# Patient Record
Sex: Female | Born: 1950 | Race: Black or African American | Hispanic: No | Smoking: Current every day smoker
Health system: Southern US, Community
[De-identification: ages and names within clinical notes are randomized; demographics above are authoritative.]

## PROBLEM LIST (undated history)

## (undated) DIAGNOSIS — I10 Essential (primary) hypertension: Secondary | ICD-10-CM

## (undated) DIAGNOSIS — Z9889 Other specified postprocedural states: Secondary | ICD-10-CM

## (undated) DIAGNOSIS — R112 Nausea with vomiting, unspecified: Secondary | ICD-10-CM

## (undated) DIAGNOSIS — I739 Peripheral vascular disease, unspecified: Secondary | ICD-10-CM

## (undated) DIAGNOSIS — H409 Unspecified glaucoma: Secondary | ICD-10-CM

## (undated) DIAGNOSIS — IMO0001 Reserved for inherently not codable concepts without codable children: Secondary | ICD-10-CM

## (undated) DIAGNOSIS — E785 Hyperlipidemia, unspecified: Secondary | ICD-10-CM

## (undated) DIAGNOSIS — D509 Iron deficiency anemia, unspecified: Secondary | ICD-10-CM

## (undated) DIAGNOSIS — G40909 Epilepsy, unspecified, not intractable, without status epilepticus: Secondary | ICD-10-CM

## (undated) DIAGNOSIS — N189 Chronic kidney disease, unspecified: Secondary | ICD-10-CM

## (undated) DIAGNOSIS — D219 Benign neoplasm of connective and other soft tissue, unspecified: Secondary | ICD-10-CM

## (undated) DIAGNOSIS — J45909 Unspecified asthma, uncomplicated: Secondary | ICD-10-CM

## (undated) DIAGNOSIS — H5410 Blindness, one eye, low vision other eye, unspecified eyes: Secondary | ICD-10-CM

## (undated) DIAGNOSIS — K219 Gastro-esophageal reflux disease without esophagitis: Secondary | ICD-10-CM

## (undated) DIAGNOSIS — I509 Heart failure, unspecified: Secondary | ICD-10-CM

## (undated) DIAGNOSIS — I639 Cerebral infarction, unspecified: Secondary | ICD-10-CM

## (undated) DIAGNOSIS — J189 Pneumonia, unspecified organism: Secondary | ICD-10-CM

## (undated) HISTORY — PX: SPINE SURGERY: SHX786

## (undated) HISTORY — PX: CERVICAL FUSION: SHX112

## (undated) HISTORY — DX: Unspecified glaucoma: H40.9

## (undated) HISTORY — DX: Hyperlipidemia, unspecified: E78.5

## (undated) HISTORY — DX: Iron deficiency anemia, unspecified: D50.9

## (undated) HISTORY — PX: ABDOMINAL HYSTERECTOMY: SHX81

## (undated) HISTORY — DX: Chronic kidney disease, unspecified: N18.9

## (undated) HISTORY — DX: Peripheral vascular disease, unspecified: I73.9

## (undated) HISTORY — DX: Cerebral infarction, unspecified: I63.9

## (undated) HISTORY — DX: Benign neoplasm of connective and other soft tissue, unspecified: D21.9

## (undated) HISTORY — PX: REFRACTIVE SURGERY: SHX103

## (undated) HISTORY — DX: Essential (primary) hypertension: I10

## (undated) HISTORY — PX: BREAST BIOPSY: SHX20

## (undated) HISTORY — DX: Blindness, one eye, low vision other eye, unspecified eyes: H54.10

---

## 1997-08-12 ENCOUNTER — Emergency Department (HOSPITAL_COMMUNITY): Admission: EM | Admit: 1997-08-12 | Discharge: 1997-08-12 | Payer: Self-pay | Admitting: *Deleted

## 1997-08-25 ENCOUNTER — Ambulatory Visit (HOSPITAL_COMMUNITY): Admission: RE | Admit: 1997-08-25 | Discharge: 1997-08-25 | Payer: Self-pay | Admitting: Internal Medicine

## 1997-10-29 ENCOUNTER — Emergency Department (HOSPITAL_COMMUNITY): Admission: EM | Admit: 1997-10-29 | Discharge: 1997-10-29 | Payer: Self-pay | Admitting: Emergency Medicine

## 1997-10-29 ENCOUNTER — Encounter: Payer: Self-pay | Admitting: Emergency Medicine

## 1997-11-03 ENCOUNTER — Encounter: Payer: Self-pay | Admitting: Internal Medicine

## 1997-11-03 ENCOUNTER — Ambulatory Visit (HOSPITAL_COMMUNITY): Admission: RE | Admit: 1997-11-03 | Discharge: 1997-11-03 | Payer: Self-pay | Admitting: Internal Medicine

## 1998-02-16 ENCOUNTER — Encounter: Payer: Self-pay | Admitting: *Deleted

## 1998-02-21 ENCOUNTER — Inpatient Hospital Stay (HOSPITAL_COMMUNITY): Admission: RE | Admit: 1998-02-21 | Discharge: 1998-02-23 | Payer: Self-pay | Admitting: *Deleted

## 1998-07-05 ENCOUNTER — Other Ambulatory Visit: Admission: RE | Admit: 1998-07-05 | Discharge: 1998-07-05 | Payer: Self-pay | Admitting: *Deleted

## 1998-09-28 ENCOUNTER — Encounter: Payer: Self-pay | Admitting: Internal Medicine

## 1998-09-28 ENCOUNTER — Ambulatory Visit (HOSPITAL_COMMUNITY): Admission: RE | Admit: 1998-09-28 | Discharge: 1998-09-28 | Payer: Self-pay | Admitting: Internal Medicine

## 1999-10-27 ENCOUNTER — Ambulatory Visit (HOSPITAL_COMMUNITY): Admission: RE | Admit: 1999-10-27 | Discharge: 1999-10-27 | Payer: Self-pay | Admitting: Gastroenterology

## 1999-10-27 ENCOUNTER — Encounter (INDEPENDENT_AMBULATORY_CARE_PROVIDER_SITE_OTHER): Payer: Self-pay | Admitting: *Deleted

## 2000-08-12 ENCOUNTER — Encounter: Payer: Self-pay | Admitting: Nephrology

## 2000-08-12 ENCOUNTER — Inpatient Hospital Stay (HOSPITAL_COMMUNITY): Admission: AD | Admit: 2000-08-12 | Discharge: 2000-08-17 | Payer: Self-pay | Admitting: Nephrology

## 2000-08-13 ENCOUNTER — Encounter: Payer: Self-pay | Admitting: Nephrology

## 2001-04-16 ENCOUNTER — Encounter: Payer: Self-pay | Admitting: Internal Medicine

## 2001-04-16 ENCOUNTER — Ambulatory Visit (HOSPITAL_COMMUNITY): Admission: RE | Admit: 2001-04-16 | Discharge: 2001-04-16 | Payer: Self-pay | Admitting: Internal Medicine

## 2001-07-09 ENCOUNTER — Ambulatory Visit (HOSPITAL_BASED_OUTPATIENT_CLINIC_OR_DEPARTMENT_OTHER): Admission: RE | Admit: 2001-07-09 | Discharge: 2001-07-09 | Payer: Self-pay | Admitting: Surgery

## 2001-07-11 ENCOUNTER — Ambulatory Visit (HOSPITAL_COMMUNITY): Admission: RE | Admit: 2001-07-11 | Discharge: 2001-07-11 | Payer: Self-pay | Admitting: Surgery

## 2002-09-09 ENCOUNTER — Encounter: Admission: RE | Admit: 2002-09-09 | Discharge: 2002-09-09 | Payer: Self-pay | Admitting: Nephrology

## 2002-09-09 ENCOUNTER — Encounter: Payer: Self-pay | Admitting: Nephrology

## 2002-12-10 ENCOUNTER — Other Ambulatory Visit: Admission: RE | Admit: 2002-12-10 | Discharge: 2002-12-10 | Payer: Self-pay | Admitting: *Deleted

## 2004-01-30 DIAGNOSIS — J189 Pneumonia, unspecified organism: Secondary | ICD-10-CM

## 2004-01-30 HISTORY — DX: Pneumonia, unspecified organism: J18.9

## 2004-03-11 ENCOUNTER — Ambulatory Visit (HOSPITAL_COMMUNITY): Admission: RE | Admit: 2004-03-11 | Discharge: 2004-03-11 | Payer: Self-pay | Admitting: Neurosurgery

## 2004-03-24 ENCOUNTER — Inpatient Hospital Stay (HOSPITAL_COMMUNITY): Admission: RE | Admit: 2004-03-24 | Discharge: 2004-04-19 | Payer: Self-pay | Admitting: Neurosurgery

## 2004-03-24 ENCOUNTER — Ambulatory Visit: Payer: Self-pay | Admitting: Physical Medicine & Rehabilitation

## 2004-03-24 ENCOUNTER — Ambulatory Visit: Payer: Self-pay | Admitting: Internal Medicine

## 2004-03-24 ENCOUNTER — Ambulatory Visit: Payer: Self-pay | Admitting: Pulmonary Disease

## 2004-03-27 ENCOUNTER — Encounter: Payer: Self-pay | Admitting: Internal Medicine

## 2004-04-03 ENCOUNTER — Encounter (INDEPENDENT_AMBULATORY_CARE_PROVIDER_SITE_OTHER): Payer: Self-pay | Admitting: *Deleted

## 2004-09-25 ENCOUNTER — Encounter: Admission: RE | Admit: 2004-09-25 | Discharge: 2004-09-25 | Payer: Self-pay | Admitting: Nephrology

## 2004-10-09 ENCOUNTER — Encounter: Admission: RE | Admit: 2004-10-09 | Discharge: 2004-10-09 | Payer: Self-pay | Admitting: Nephrology

## 2006-05-14 ENCOUNTER — Other Ambulatory Visit: Admission: RE | Admit: 2006-05-14 | Discharge: 2006-05-14 | Payer: Self-pay | Admitting: Nephrology

## 2007-07-07 ENCOUNTER — Encounter: Admission: RE | Admit: 2007-07-07 | Discharge: 2007-07-07 | Payer: Self-pay | Admitting: Neurosurgery

## 2007-07-10 ENCOUNTER — Other Ambulatory Visit: Admission: RE | Admit: 2007-07-10 | Discharge: 2007-07-10 | Payer: Self-pay | Admitting: Gynecology

## 2007-09-08 ENCOUNTER — Ambulatory Visit (HOSPITAL_COMMUNITY): Admission: RE | Admit: 2007-09-08 | Discharge: 2007-09-08 | Payer: Self-pay | Admitting: Ophthalmology

## 2008-04-07 ENCOUNTER — Ambulatory Visit (HOSPITAL_COMMUNITY): Admission: RE | Admit: 2008-04-07 | Discharge: 2008-04-07 | Payer: Self-pay | Admitting: Ophthalmology

## 2008-04-09 ENCOUNTER — Ambulatory Visit (HOSPITAL_COMMUNITY): Admission: RE | Admit: 2008-04-09 | Discharge: 2008-04-09 | Payer: Self-pay | Admitting: Ophthalmology

## 2008-10-09 ENCOUNTER — Ambulatory Visit: Payer: Self-pay | Admitting: Internal Medicine

## 2008-10-09 ENCOUNTER — Inpatient Hospital Stay (HOSPITAL_COMMUNITY): Admission: EM | Admit: 2008-10-09 | Discharge: 2008-10-11 | Payer: Self-pay | Admitting: Emergency Medicine

## 2008-12-29 ENCOUNTER — Emergency Department (HOSPITAL_COMMUNITY): Admission: EM | Admit: 2008-12-29 | Discharge: 2008-12-29 | Payer: Self-pay | Admitting: Emergency Medicine

## 2009-04-11 ENCOUNTER — Ambulatory Visit: Payer: Self-pay | Admitting: Surgery

## 2009-04-18 ENCOUNTER — Encounter: Admission: RE | Admit: 2009-04-18 | Discharge: 2009-04-18 | Payer: Self-pay | Admitting: Neurology

## 2009-04-26 ENCOUNTER — Encounter (INDEPENDENT_AMBULATORY_CARE_PROVIDER_SITE_OTHER): Payer: Self-pay | Admitting: Neurology

## 2009-04-26 ENCOUNTER — Ambulatory Visit: Payer: Self-pay

## 2009-04-26 ENCOUNTER — Ambulatory Visit (HOSPITAL_COMMUNITY): Admission: RE | Admit: 2009-04-26 | Discharge: 2009-04-26 | Payer: Self-pay | Admitting: Neurology

## 2009-04-26 ENCOUNTER — Ambulatory Visit: Payer: Self-pay | Admitting: Cardiology

## 2009-07-17 ENCOUNTER — Emergency Department (HOSPITAL_COMMUNITY): Admission: EM | Admit: 2009-07-17 | Discharge: 2009-07-17 | Payer: Self-pay | Admitting: Emergency Medicine

## 2009-08-08 ENCOUNTER — Ambulatory Visit: Payer: Self-pay | Admitting: Surgery

## 2009-12-05 ENCOUNTER — Ambulatory Visit (HOSPITAL_COMMUNITY): Admission: RE | Admit: 2009-12-05 | Discharge: 2009-12-05 | Payer: Self-pay | Admitting: Ophthalmology

## 2009-12-26 ENCOUNTER — Ambulatory Visit (HOSPITAL_COMMUNITY): Admission: RE | Admit: 2009-12-26 | Discharge: 2009-12-26 | Payer: Self-pay | Admitting: Ophthalmology

## 2010-02-19 ENCOUNTER — Encounter: Payer: Self-pay | Admitting: Nephrology

## 2010-04-11 LAB — BASIC METABOLIC PANEL
BUN: 9 mg/dL (ref 6–23)
CO2: 26 mEq/L (ref 19–32)
Chloride: 107 mEq/L (ref 96–112)
Chloride: 110 mEq/L (ref 96–112)
Creatinine, Ser: 0.92 mg/dL (ref 0.4–1.2)
GFR calc Af Amer: >60 mL/min (ref >60)
GFR calc non Af Amer: >60 mL/min (ref >60)
Sodium: 141 mEq/L (ref 135–145)

## 2010-04-11 LAB — CBC
HCT: 42.7 % (ref 36.0–46.0)
Hemoglobin: 13.5 g/dL (ref 12.0–15.0)
MCH: 25.3 pg — ABNORMAL LOW (ref 26.0–34.0)
MCV: 79.4 fL (ref 78.0–100.0)
MCV: 80.1 fL (ref 78.0–100.0)
Platelets: 227 10*3/uL (ref 150–400)
RBC: 5.33 MIL/uL — ABNORMAL HIGH (ref 3.87–5.11)
RDW: 15.7 % — ABNORMAL HIGH (ref 11.5–15.5)
WBC: 9.4 10*3/uL (ref 4.0–10.5)

## 2010-04-11 LAB — GLUCOSE, CAPILLARY
Glucose-Capillary: 106 mg/dL — ABNORMAL HIGH (ref 70–99)
Glucose-Capillary: 119 mg/dL — ABNORMAL HIGH (ref 70–99)
Glucose-Capillary: 176 mg/dL — ABNORMAL HIGH (ref 70–99)
Glucose-Capillary: 65 mg/dL — ABNORMAL LOW (ref 70–99)
Glucose-Capillary: 65 mg/dL — ABNORMAL LOW (ref 70–99)
Glucose-Capillary: 87 mg/dL (ref 70–99)

## 2010-04-11 LAB — APTT: aPTT: 26 seconds (ref 24–37)

## 2010-04-16 LAB — POCT I-STAT, CHEM 8
BUN: 22 mg/dL (ref 6–23)
Chloride: 109 mEq/L (ref 96–112)
Potassium: 4 mEq/L (ref 3.5–5.1)
Sodium: 141 mEq/L (ref 135–145)

## 2010-04-16 LAB — CBC
Hemoglobin: 11.8 g/dL — ABNORMAL LOW (ref 12.0–15.0)
MCHC: 33.1 g/dL (ref 30.0–36.0)
RBC: 4.34 MIL/uL (ref 3.87–5.11)

## 2010-04-16 LAB — PROTIME-INR: INR: 1.02 (ref 0.00–1.49)

## 2010-04-16 LAB — GLUCOSE, CAPILLARY: Glucose-Capillary: 43 mg/dL — CL (ref 70–99)

## 2010-05-02 LAB — URINE MICROSCOPIC-ADD ON

## 2010-05-02 LAB — URINALYSIS, ROUTINE W REFLEX MICROSCOPIC
Glucose, UA: 500 mg/dL — AB
Specific Gravity, Urine: 1.012 (ref 1.005–1.030)
Urobilinogen, UA: 0.2 mg/dL (ref 0.0–1.0)

## 2010-05-05 LAB — BASIC METABOLIC PANEL
BUN: 11 mg/dL (ref 6–23)
CO2: 16 mEq/L — ABNORMAL LOW (ref 19–32)
Calcium: 8.6 mg/dL (ref 8.4–10.5)
Chloride: 106 mEq/L (ref 96–112)
Chloride: 98 mEq/L (ref 96–112)
Creatinine, Ser: 0.8 mg/dL (ref 0.4–1.2)
Creatinine, Ser: 0.8 mg/dL (ref 0.4–1.2)
Creatinine, Ser: 1.29 mg/dL — ABNORMAL HIGH (ref 0.4–1.2)
GFR calc Af Amer: 52 mL/min — ABNORMAL LOW (ref >60)
GFR calc Af Amer: >60 mL/min (ref >60)
GFR calc Af Amer: >60 mL/min (ref >60)
GFR calc non Af Amer: >60 mL/min (ref >60)
Potassium: 5 mEq/L (ref 3.5–5.1)

## 2010-05-05 LAB — GLUCOSE, CAPILLARY
Glucose-Capillary: 213 mg/dL — ABNORMAL HIGH (ref 70–99)
Glucose-Capillary: 228 mg/dL — ABNORMAL HIGH (ref 70–99)
Glucose-Capillary: 239 mg/dL — ABNORMAL HIGH (ref 70–99)
Glucose-Capillary: 245 mg/dL — ABNORMAL HIGH (ref 70–99)
Glucose-Capillary: 251 mg/dL — ABNORMAL HIGH (ref 70–99)
Glucose-Capillary: 302 mg/dL — ABNORMAL HIGH (ref 70–99)
Glucose-Capillary: 373 mg/dL — ABNORMAL HIGH (ref 70–99)

## 2010-05-05 LAB — CARDIAC PANEL(CRET KIN+CKTOT+MB+TROPI)
CK, MB: 3.1 ng/mL (ref 0.3–4.0)
CK, MB: 3.5 ng/mL (ref 0.3–4.0)
Total CK: 92 U/L (ref 7–177)
Troponin I: 0.03 ng/mL (ref 0.00–0.06)

## 2010-05-05 LAB — URINE CULTURE

## 2010-05-05 LAB — URINALYSIS, ROUTINE W REFLEX MICROSCOPIC
Glucose, UA: >1000 mg/dL — AB
Leukocytes, UA: NEGATIVE
Specific Gravity, Urine: 1.035 — ABNORMAL HIGH (ref 1.005–1.030)
pH: 5.5 (ref 5.0–8.0)

## 2010-05-05 LAB — DIFFERENTIAL
Basophils Relative: 0 % (ref 0–1)
Lymphocytes Relative: 26 % (ref 12–46)
Lymphs Abs: 2.2 10*3/uL (ref 0.7–4.0)
Monocytes Absolute: 0.4 10*3/uL (ref 0.1–1.0)
Monocytes Relative: 5 % (ref 3–12)
Neutro Abs: 5.6 10*3/uL (ref 1.7–7.7)
Neutrophils Relative %: 67 % (ref 43–77)

## 2010-05-05 LAB — CBC
HCT: 36.7 % (ref 36.0–46.0)
HCT: 41.2 % (ref 36.0–46.0)
MCHC: 35.6 g/dL (ref 30.0–36.0)
MCV: 80.9 fL (ref 78.0–100.0)
MCV: 81.1 fL (ref 78.0–100.0)
Platelets: 149 10*3/uL — ABNORMAL LOW (ref 150–400)
RBC: 5.09 MIL/uL (ref 3.87–5.11)
RDW: 14.6 % (ref 11.5–15.5)
WBC: 7.2 10*3/uL (ref 4.0–10.5)

## 2010-05-05 LAB — URINE MICROSCOPIC-ADD ON

## 2010-05-05 LAB — POCT CARDIAC MARKERS
CKMB, poc: 2.6 ng/mL (ref 1.0–8.0)
Troponin i, poc: <0.05 ng/mL (ref 0.00–0.09)

## 2010-05-05 LAB — LIPID PANEL
HDL: 71 mg/dL (ref >39)
LDL Cholesterol: UNDETERMINED mg/dL (ref 0–99)
Triglycerides: 1029 mg/dL — ABNORMAL HIGH (ref <150)

## 2010-05-05 LAB — CK TOTAL AND CKMB (NOT AT ARMC): Total CK: 108 U/L (ref 7–177)

## 2010-05-11 LAB — BASIC METABOLIC PANEL
BUN: 8 mg/dL (ref 6–23)
CO2: 25 mEq/L (ref 19–32)
Chloride: 108 mEq/L (ref 96–112)
Creatinine, Ser: 0.96 mg/dL (ref 0.4–1.2)

## 2010-05-11 LAB — CBC
MCHC: 33.6 g/dL (ref 30.0–36.0)
MCV: 81.5 fL (ref 78.0–100.0)
Platelets: 201 10*3/uL (ref 150–400)
RDW: 14.3 % (ref 11.5–15.5)
WBC: 9.2 10*3/uL (ref 4.0–10.5)

## 2010-05-11 LAB — GLUCOSE, CAPILLARY
Glucose-Capillary: 46 mg/dL — ABNORMAL LOW (ref 70–99)
Glucose-Capillary: 63 mg/dL — ABNORMAL LOW (ref 70–99)
Glucose-Capillary: 71 mg/dL (ref 70–99)
Glucose-Capillary: 85 mg/dL (ref 70–99)

## 2010-06-13 NOTE — Procedures (Signed)
LOWER EXTREMITY ARTERIAL DUPLEX   INDICATION:  Evaluate arterial disease.   HISTORY:  Diabetes:  Yes  Cardiac:  No  Hypertension:  Yes  Smoking:  Yes  Previous Surgery:  No   SINGLE LEVEL ARTERIAL EXAM                          RIGHT                LEFT  Brachial:               146                  158  Anterior tibial:        142                  70  Posterior tibial:       144                  83  Peroneal:  Ankle/Brachial Index:   0.91                 0.53   LOWER EXTREMITY ARTERIAL DUPLEX EXAM   DUPLEX:  The patient's left superficial femoral artery appears with  monophasic wave forms.  There is no flow visualized in the mid  superficial femoral artery; however, the flow reconstitutes back at mid  distal thigh area.   IMPRESSION:  1. The right ankle-brachial index suggests mild arterial disease.  2. The left ankle-brachial index suggests moderate arterial disease.  3. The mid left superficial femoral artery appears occluded but flow      reconstitutes at mid to distal superficial femoral artery.         ___________________________________________  V. Leia Alf, MD   CB/MEDQ  D:  04/11/2009  T:  04/12/2009  Job:  [id number]

## 2010-06-13 NOTE — Op Note (Signed)
Julie Brewer, Julie Brewer         ACCOUNT NO.:  [id number]   MEDICAL RECORD NO.:  [id number]          PATIENT TYPE:  AMB   LOCATION:  SDS                          FACILITY:  [facility]   PHYSICIAN:  Clent Demark. Rankin, M.D.   DATE OF BIRTH:  December 09, 1950   DATE OF PROCEDURE:  09/08/2007  DATE OF DISCHARGE:  09/08/2007                               OPERATIVE REPORT   PREOPERATIVE DIAGNOSES:  1. Tractional detachment, left eye, inferotemporally, obscured      initially.  2. Dense vitreous hemorrhage left eye with hampering of activities of      daily living, visual acuity impairment.  3. Progressive proliferative diabetic retinopathy, left eye despite      previous panphotocoagulation.   POSTOPERATIVE DIAGNOSES:  1. Tractional detachment, left eye, inferotemporally, obscured      initially.  2. Dense vitreous hemorrhage, left eye with  activities of daily      living, visual acuity impairment.  3. Progressive proliferative diabetic retinopathy, left eye despite      previous panphotocoagulation.   PROCEDURE:  1. Posterior vitrectomy with membrane peel - epiretinal membrane -      fibrovascular proliferation elsewhere along the inferotemporal      arcade with adjunctive endolaser photocoagulation - 25 gauge, left      eye.   SURGEON:  Clent Demark. Rankin, MD   ANESTHESIA:  Local retrobulbar, monitored anesthesia control.   INDICATIONS FOR PROCEDURE:  The patient is a 60 year old woman who has  profound visual loss on the basis of dense vitreous hemorrhage to the  left eye.  She has a known progression of diabetic retinopathy, which  has now led to significant preretinal __________ and preretinal  hemorrhage with subhyaloid hemorrhage.   She understands the risks of anesthesia including recurrence of death,  loss of the eye, including but not limited to from the underlying  condition of surgical repair, hemorrhage, infection, scarring, need for  the surgery, no change in vision, loss of  vision, and progression of  disease despite intervention.   PROCEDURE IN DETAIL:  After an appropriate signed consent was obtained,  the patient was taken to the operating room.  In the operating room,  appropriate monitors followed by mild sedation.  A 2% Xylocaine was  injected, 5 mL retrobulbar with additional 5 mL laterally in fashion of  modified Kirk Ruths.  Left periocular region was sterilely prepped and  draped in the usual sterile fashion.  Lid speculum was applied.  A 25-  gauge trocar was applied in the inferotemporal quadrant.  The infusion  was turned on.  Superior trocar was applied.  Core vitrectomy was then  begun.  Dense vitreous hemorrhage is removed.  Periretinal hemorrhage is  removed after opening the posterior hyaloid vitreoretinal attachments,  temporal arcade as well as on the optic nerve were identified and  elevated.  Hyaloid was then elevated and trimmed to 360 degrees.  Vitreous was curved and trimmed to 360 degrees.  Scleral depression was  then used inferiorly to move the hemorrhage inspissated in the vitreous  base.   At this time, additional laser photocoagulation placed around the site  of the inferotemporal neovascularization elsewhere because of it's  ongoing bleeding propensity.  Hemostasis was obtained.  At this time,  the instruments were removed from the eye.  Retina was reattached.  No  complications.  There were no holes or tears.  The superior  trocars were removed and the wounds were secured.  The infusion was  removed.  Subconjunctival Decadron was applied.  Sterile patch and Fox  shield applied.  The patient was taken to the taken to the PACU in good  and stable condition.      Clent Demark Rankin, M.D.  Electronically Signed     GAR/MEDQ  D:  09/08/2007  T:  09/09/2007  Job:  [id number]

## 2010-06-13 NOTE — Op Note (Signed)
NAMEMIREYLI, VERDINE         ACCOUNT NO.:  [id number]   MEDICAL RECORD NO.:  [id number]          PATIENT TYPE:  AMB   LOCATION:  SDS                          FACILITY:  [facility]   PHYSICIAN:  Clent Demark. Rankin, M.D.   DATE OF BIRTH:  Nov 28, 1950   DATE OF PROCEDURE:  04/09/2008  DATE OF DISCHARGE:                               OPERATIVE REPORT   PREOPERATIVE DIAGNOSES:  1. Dense nonclearing recurrent vitreous hemorrhage to the left eye.  2. Neovascular glaucoma with iris rubeosis, progressive, left eye.  3. Anterior chamber debris as well.   POSTOPERATIVE DIAGNOSES:  1. Dense nonclearing recurrent vitreous hemorrhage to the left eye.  2. Neovascular glaucoma with iris rubeosis, progressive, left eye.  3. Anterior chamber debris as well.   PROCEDURE:  1. Posterior vitrectomy with endolaser panphotocoagulation - 20 gauge,      left eye.  2. Insertion of glaucoma seton - aqueous shunt, left eye - Baerveldt,      model implant is [id number] into the anterior chamber, left eye.   SURGEON:  Clent Demark. Rankin, MD   ANESTHESIA:  General endotracheal anesthesia.   INDICATIONS FOR PROCEDURE:  The patient is a 60 year old woman with  advanced diabetic retinopathy who is status post successful vitrectomy,  repair of complex tractional retinal detachment of the left eye.  The  patient, however, has dense recurrent nonclearing vitreous hemorrhage  with impairment of activities of daily living.  Moreover though she has  progressive iris rubeosis which suggest that there is still some  significant regions of peripheral retinal ischemia contributing to this  and the neovascular glaucoma.  She understands this is an attempt to  salvage the eye and also to clear the vitreous opacification.  She  understands the risk of anesthesia including rare occurrence of death,  loss of the eye including, but no limited to hemorrhage, infection,  scarring, no change in vision, progressive disease despite  intervention,  need for another surgery, and failure of the glaucoma procedure.  She  understands these risks and wishes to proceed.   PROCEDURE IN DETAIL:  Appropriate signed consent was obtained.  She was  taken to the operating room.  There, the general endotracheal anesthesia  was induced without difficulty.  Left periocular region was sterilely  prepped and draped in the usual ophthalmic fashion.  Lid speculum  applied.  Conjunctival peritomy was then fashioned from the 11:30  position over to 3:30 position.  The superior and the lateral rectus  muscles isolated on 2-0 silk ties for control.  The infusion cannula was  secured 3.5 mm posterior to the limbus in the inferotemporal quadrant.  Placement in the vitreous cavity was verified visually.  Superior  sclerotomy was fashioned.  Vitreous washout basically occurred.  Notable  findings that  the retina was attached.  The optic nerve was pink.  Preretinal vitreous hemorrhage was aspirated.  Fluid exchange completed  on 2 occasions to remove all the hemorrhage.  It was necessary because  the iris vessels actually drained in the anterior chamber.  Paracentesis  incision was made in the anterior chamber to move some of the obscuring  blood in the eye.  This was absolutely necessary thereafter because the  view was still cloudy to place clear Viscoat into the anterior chamber  so as to maintain clear visual axis.  This was done without difficulty.  The anterior chamber was not filled, just to improve the visualaxis.  Thereafter the vitrectomy could be completed under fluoro.  Endolaser  photocoagulation was placed.  To allow more laser to be placed, fluid-  air exchange completed.  Peripheral retina was then treated further in a  semiconfluent fashion with laser photocoagulation.  No complications  occurred.  The instruments were removed from the eye.  The  superotemporal sclerotomy was closed with 7-0 Vicryl suture.  This was  done  after fluid-air exchange was completed and air-silicone oil  exchange was completed passively.  The superonasal sclerotomy was  closed.  The infusion was removed and sclerotomy closed with 7-0 Vicryl.  At this time, Baerveldt implant was then selected, irrigated on the  surface of the implant with bug juice and the tube was flushed with the  BSS.  The implant was secured with 8-0 nylon sutures at appropriate  distance with the wings into the rectus muscles superotemporally.  The  tube was then secured over the curvature of the globe gently with a  horizontal mattress suture lightly applied with scleral sutures and  scleral bites and secured the tube in place to prevent retraction.  No  compression of the tube was placed for this.  Thereafter, MVR blade was  used to create a paracentesis incision on tunnel to the feed the tube.  The tube was cut to the appropriate length.  The tube fed to the  anterior chamber and thereafter a 7-0 Vicryl suture was then used as a  ligature to gently compress lumen of the glaucoma implant.  At this  time, the Tutoplast was then secured with vertical mattress 7-0 Vicryl  suture.  Care was taken not to irrigate this area with bug juice on the  Tutoplast because of the risk of autolysis.  At this time, the  conjunctiva was then brought forward and closed at the limbus with 7-0  Vicryl suture, vertical mattress fashion and alignment with the wings  with simple interrupted sutures.  A 26-guage needle with BSS was then  placed across the anterior chamber through the peripheral cornea and  confirmed that the tube was patent with free flowing fluid did egress  from the anterior chamber into the subconjunctival space.  At this time,  subconjunctival Decadron applied.  All the instruments had been removed.  The lid speculum was applied.  Sterile patch and Fox shield were applied  to the left eye.  The patient tolerated the procedure well without  complication and  taken to the PACU in good and stable condition.      Clent Demark Rankin, M.D.  Electronically Signed     GAR/MEDQ  D:  04/09/2008  T:  04/10/2008  Job:  [id number]

## 2010-06-13 NOTE — Assessment & Plan Note (Signed)
OFFICE VISIT   KERAN, ROSELLO.  DOB:  December 22, 1950                                       04/11/2009  [id number]   REASON FOR VISIT:  Leg pain.   ADDITIONAL REFERRING PHYSICIAN:  Dr. Beola Cord.   HISTORY OF PRESENT ILLNESS:  The patient is a 60 year old female who I  am seeing in referral from Dr. Beola Cord for evaluation of leg pain.  The  patient states that in the past several months she has been having  worsening bilateral leg aches.  These occur at anytime and have been  throughout the day.  She describes her legs as always being cold.  She  has trouble walking.  She does state that her left leg tends to give way  after about a block.  This does not happen to her right leg and this has  caused her to not be able to go grocery shopping.   The patient was recently evaluated by neurology and diagnosed with a  mild stroke.  She has residual decrease in strength on the left side.  Her workup is pending at this time.  She is scheduled undergo carotid  duplex an MRI within the next several weeks.   The patient suffers from hypertension, hypercholesterolemia, both of  which are medically managed.  She is a diabetic since 1985 and requiring  insulin.   REVIEW OF SYSTEMS:  CARDIAC:  Positive chest pain, shortness of breath  with exertion.  VASCULAR:  Positive for pain in her legs when walking and lying flat,  slurred speech.  MUSCULOSKELETAL:  Positive arthritis and muscle pain.  All other review  systems negative unless documented in the encounter form.   FAMILY HISTORY:  Positive for cancer and a heart attack in her mother.  Diabetes in her father.   SOCIAL HISTORY:  She has 1 child.  She currently smokes a pack a day.  Does not drink.   ALLERGIES:  Penicillin and morphine.   PHYSICAL EXAMINATION:  Heart rate 74, blood pressure 138/82, O2  saturations 97%.  General:  Well-appearing, in no distress.  HEENT:  Within normal limits.  Lungs:   Clear bilaterally.  Cardiovascular:  Regular rate and rhythm.  No carotid bruits.  She has 2+ pitting edema  bilaterally.  Palpable pedal pulse on the right, nonpalpable on the  left.  No ulceration.  Abdomen:  Soft, nontender.  Musculoskeletal:  Without major deformities.  Neurology:  She has slightly decreased  strength on the left side.  Skin:  Without rash or ulceration.   DIAGNOSTIC STUDIES:  Ankle brachial index is 0.53 on the left and 0.91  on the right.   PLAN:  1. Peripheral vascular disease :  The patient  does suffer from left      leg claudication.  However, I do not feel that arterial      insufficiency is the etiology of her bilateral leg complaints as      her ankle brachial index on the right is near normal and her right      leg bothers her just as much as a left.  I do think that the giving      out of her left leg is secondary to peripheral vascular disease.      This would be something that we could improve with an intervention,  however, since this is not one of her biggest complaints, we will      attempt to manage this medically for the time being, I have given      her a prescription for cilostazol to see if this can improve the      distance she can walk before her leg gives out.  I have also      encouraged her to continue to walk through the pain that she may      experience.  2. Mild stroke:  The patient is currently being seen by [facility]      Neurology and she is currently undergoing  a stroke workup.  Before      proceeding with any lower extremity  intervention, I would like all      of her  stroke issues to be resolved.  The patient states that she      has a carotid duplex scheduled within the next week or 2.  I  would      like to have a copy of the results faxed to me, so that should she      require endarterectomy that could be addressed.  Otherwise I will      have the patient come back to see me in 3 months and will see if      she had any  benefit from cilostazol.     Eldridge Abrahams, MD  Electronically Signed   VWB/MEDQ  D:  04/11/2009  T:  04/12/2009  Job:  [id number]   cc:   Dr. Chestine Spore, M.D.

## 2010-06-16 NOTE — Op Note (Signed)
Julie Brewer, Julie Brewer         ACCOUNT NO.:  [id number]   MEDICAL RECORD NO.:  [id number]          PATIENT TYPE:  INP   LOCATION:  3106                         FACILITY:  [facility]   PHYSICIAN:  Odis Hollingshead, M.D.DATE OF BIRTH:  1950-11-12   DATE OF PROCEDURE:  04/02/2004  DATE OF DISCHARGE:                                 OPERATIVE REPORT   PREOPERATIVE DIAGNOSIS:  Fournier's gangrene right vulva area.   POSTOPERATIVE DIAGNOSIS:  Fournier's gangrene right vulva area.   PROCEDURE:  Irrigation, debridement, drainage, and partial right vulvectomy.   SURGEON:  Odis Hollingshead, M.D.   ANESTHESIA:  General.   INDICATIONS FOR PROCEDURE:  Julie Brewer is a 60 year old female with  diabetes mellitus and Fournier's gangrene in the right vulvar area.  She had  extensive incision, drainage, debridement, and a partial vulvectomy  yesterday.  She now presents for dressing change and wound inspection.   DESCRIPTION OF PROCEDURE:  The patient was seen in the holding area and  brought to the operating room.  Placed supine on the operating table and  general anesthesia was administered.  She was then placed in the lithotomy  position.  Dressing was removed.  The area was sterilely prepped and draped.  I began by examining the wound and noticing more purulent fluid and pockets  tracking internally toward the pelvis bone, superiorly, and medially.  I  unroofed these and debrided necrotic tissue with cautery.  I then excised  part of the skin and vulvar area superiorly and medially on the right side.  I continued to discover some pockets and debride necrotic tissue.  I got  down and close to periosteum of the pelvis bone in one area.  Once I had  debrided what I felt was all necrotic tissue, I again inspected the wound  applying pressure.  I did not see any other area of fluctuance or necrosis.  I went ahead and irrigated the wound down with warm saline solution.  It was  then packed  with saline-soaked Curlex gauze followed by a dry dressing.   She tolerated the procedure well without any apparent complications.  She  was taken to the recovery room in satisfactory condition.  She will need to  come back to the operating room tomorrow for wound inspection, dressing  change, and possible further debridement.  I have explained this to her  daughter.      TJR/MEDQ  D:  04/02/2004  T:  04/03/2004  Job:  [id number]

## 2010-06-16 NOTE — Op Note (Signed)
[facility]  Patient:    Julie Brewer, Julie Brewer                MRN: [id number] Proc. Date: 10/27/99 Adm. Date:  [id number] Attending:  Rafael Bihari CC:         Don Broach. Carlis Abbott, M.D.   Operative Report  PROCEDURE:  Colonoscopy.  GASTROENTEROLOGIST:  Elyse Jarvis. Amedeo Plenty, M.D.  INDICATIONS FOR PROCEDURE:  The patient had recent onset of fecal obstipation and abdominal pain.  He also has a family history of colon cancer in a first degree relative.  DESCRIPTION OF PROCEDURE:   The patient was placed in the left lateral decubitus position and placed on the pulse monitor with continuous low-flow oxygen delivered by nasal cannula.  She was sedated with 90 mg IV Demerol and 9 mg IV Versed.  The Olympus video colonoscope was inserted into the rectum and advanced to the cecum, confirmed by transillumination of McBurneys point and visualization of the ileocecal valve and appendiceal orifice.  The prep was good.  The cecum, ascending, transverse and descending colon all appeared normal with no masses, polyps, diverticula, or other mucosal abnormalities. In the sigmoid and rectum were seen a few sessile translucent-appearing polyps which I suspected were most likely hyperplastic.  There was one with a central dimpled area and had relatively normal overlying mucosa that was somewhat suspicious for inverted diverticulum.  One of the other polyps was biopsied, and a more discrete-appearing, 1 cm x 8 mm polyp was seen in the rectum, and this was removed by hot biopsy.  The remainder of he rectum appeared normal, and retroflexed view of the anus revealed no obvious internal hemorrhoids. The colonoscope was then withdrawn, and the patient returned to the recovery room in stable condition.  She tolerated the procedure well, and there were no immediate complications.  IMPRESSIONS:   Several small rectal and sigmoid polyps, suspect hyperplastic. Two of the larger ones  sampled.  PLAN:  Await histology for determination of interval for future surveillance colonoscopies based on her family history and presence or absence of adenomas on this study. DD:  10/27/99 TD:  10/27/99 Job: [id number]

## 2010-06-16 NOTE — Op Note (Signed)
NAMEDICIE, PIPPERT         ACCOUNT NO.:  [id number]   MEDICAL RECORD NO.:  [id number]          PATIENT TYPE:  INP   LOCATION:  3106                         FACILITY:  [place] [facility]   PHYSICIAN:  Odis Hollingshead, M.D.DATE OF BIRTH:  10-20-1950   DATE OF PROCEDURE:  04/01/2004  DATE OF DISCHARGE:                                 OPERATIVE REPORT   PREOPERATIVE DIAGNOSIS:  Right vulvar abscess.   POSTOPERATIVE DIAGNOSIS:  Fournier's gangrene right vulva.   PROCEDURE:  1.  Incision and drainage right vulvar abscess.  2.  Extensive debridement of soft tissue.  3.  Partial right vulvectomy.   SURGEON:  Odis Hollingshead, M.D.   ANESTHESIA:  General.   INDICATION:  This 60 year old female is postop day 8 from lumbar spine  surgery.  She began having some discomfort in the right vulvar area and  swelling and spontaneous purulent drainage.  She has been spiking fevers.  She is now brought to the operating room for the above procedure.   TECHNIQUE:  She was seen in the holding area and brought to the operating  room, placed supine on the operating table.  With her awake, she was placed  in the general lithotomy position so it was not uncomfortable for her back.  She was then given the general anesthetic.  The perineal area and perianal  area were sterilely prepped and draped.  I noticed the area of spontaneous  drainage and stuck a Kelly clamp on this and purulent drainage came out.  I  cultured this.  I then made an incision posteriorly and anteriorly and  exposed a very large cavity with necrotic subcutaneous tissue.  I sharply  and bluntly debrided this aggressively.  I then performed a partial right  vulvectomy, excising the skin of the vulva, thus exposing the large cavity.  I found other pockets present and continued to debride necrotic tissue and  drain pockets; this, this was a fairly complex infection with obvious  gangrenous changes consistent with Fournier's gangrene.  I  did this for  approximately 35 minutes.  I then held direct pressure.  There were some  bleeding areas.  Hemostasis was obtained using electrocautery.  I inspected  the area a number of times and held pressure for 5 minutes and inspected the  wound and no further bleeding was noted.  I then packed and entire saline  soaked Kerlix sponge into the wound tightly and covered it with a bulky  dressing followed by mesh panties.   She lost approximately 400 mL of blood.  She tolerated the procedure well  without any apparent complications.  She will need to have a dressing change  and further debridement in the operating room tomorrow.    TJR/MEDQ  D:  04/01/2004  T:  04/02/2004  Job:  [id number]

## 2010-06-16 NOTE — Discharge Summary (Signed)
Julie Brewer, Julie Brewer         ACCOUNT NO.:  [id number]   MEDICAL RECORD NO.:  [id number]          PATIENT TYPE:  INP   LOCATION:  3030                         FACILITY:  [facility]   PHYSICIAN:  Marchia Meiers. Vertell Limber, M.D.  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   ADMISSION DIAGNOSES:  1.  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  2.  Respiratory failure.  3.  Pneumonia.  4.  Chronic obstructive pulmonary disease.  5.  Necrotizing fasciitis.  6.  Gangrene.  7.  Hypo-osmolality.  8.  Hypertension.  9.  Diabetes type 2, with neurologic manifestations, with ophthalmic      manifestations and diabetic retinopathy.  10. Gastroparesis.  11. Depressive disorder.   DISCHARGE DIAGNOSES:  1.  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  2.  Respiratory failure.  3.  Pneumonia.  4.  Chronic obstructive pulmonary disease.  5.  Necrotizing fasciitis.  6.  Gangrene.  7.  Hypo-osmolality.  8.  Hypertension.  9.  Diabetes type 2, with neurologic manifestations, with ophthalmic      manifestations and diabetic retinopathy.  10. Gastroparesis.  11. Depressive disorder.   DISCHARGE DIAGNOSES:  1.  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  2.  Respiratory failure.  3.  Pneumonia.  4.  Chronic obstructive pulmonary disease.  5.  Necrotizing fasciitis.  6.  Gangrene.  7.  Hypo-osmolality.  8.  Hypertension.  9.  Diabetes type 2, with neurologic manifestations, with ophthalmic      manifestations and diabetic retinopathy.  10. Gastroparesis.  11. Depressive disorder.   DISCHARGE DIAGNOSES:  1.  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  2.   Respiratory failure.  3.  Pneumonia.  4.  Chronic obstructive pulmonary disease.  5.  Necrotizing fasciitis.  6.  Gangrene.  7.  Hypo-osmolality.  8.  Hypertension.  9.  Diabetes type 2 with neurologic manifestations, with ophthalmic      manifestations and diabetic retinopathy.  10. Gastroparesis.  11. Depressive disorder.   HISTORY OF PRESENT ILLNESS:  [name] is a 60 year old woman  with low back pain, left leg pain and left leg weakness.  She has been out  of work for the last three years.  She has a history of insulin-dependent  diabetes.  I had previously done surgery on her neck at the C4-5 and C5-6  levels in 1996, from which she did well.  She had imaging of her lumbar  spine obtained with an MRI, which demonstrates right posterolateral disk  herniation at L4-5 with facet and ligamentous hypertrophy and bilateral  lateral recess stenosis, right worse than left.  At the L3-4 level she has a  left foraminal and extra-foraminal disk herniation causing left L3 nerve  root compression.  She is complaining of left anterolateral thigh pain and  L3 radiculopathy.  She is not felt to be a candidate for steroid injections,  because she is a brittle diabetic.  I went over her films with my partner,  Dr. Hosie Spangle, and recommended bilateral laminoforaminotomies at L4-  5 with possible diskectomy at that level and with a far lateral diskectomy  at L3-4 on the left.   HOSPITAL COURSE:  The patient was admitted on a same-day-as-procedure basis  for this surgical procedure.  She underwent an uncomplicated surgery for  lumbar spondylosis and far lateral disk herniation.  Postoperatively she was  awake, alert and conversant and doing well; however, the next day she  developed a temperature up to 103 degrees and was subsequently found to  develop pneumonia and she was seen by pulmonary critical care consultation.  She was transferred to the intensive care unit and  continued to have a fever  on March 28, 2004.  She had a gradual improvement in her pneumonia, for  which she was being treated with IV antibiotics.   The patient then on March 31, 2004, was still running a fever.  She was seen  by the infectious disease physicians.  She had a foul-smelling vaginal  discharge and a CT was obtained.  She was seen by the general surgeon.  She  had previously had a mons pubis abscess treated surgically on July 09, 2001.  On this occasion, she appeared to be developing a recurrent abscess in this  region.  She was taken to surgery by the general surgeons on April 01, 2004,  with a right vulvar abscess.  Postoperatively was given the diagnosis of  Fournier's gangrene of the right vulva.  She underwent an incision and  debridement with a partial vulvectomy by Dr. Odis Hollingshead.  She  subsequently had a protracted course in the hospital with frequent dressing  changes and with significant postoperative pain.  She was managed on  intravenous antibiotics, and started to have some gradual improvement.  She  was taken to the operating room by Dr. Haywood Lasso on April 03, 2004,  for a debridement and again on April 07, 2004, for additional debridement.  An attempt was made at a [facility] dressing, although it was not possible to  secure this.  She was taken back to the operating room on April 09, 2004,  for a dressing change.   She continued from a neurologic standpoint to be doing well, without  significant problems, but continued to have difficulty with the pain  management, secondary to her vulvar abscess.  She was doing better on March  16th, and was felt to be improving by the general surgeons on March 17th.  She did have some problems with depression with this prolonged  hospitalization.   DISPOSITION/FOLLOWUP:  She was felt to be doing well on March 22nd.  At that point it was elected to discharge her home with followup to Dr. Marchia Meiers.  Vertell Limber in  three weeks, and to follow up with the general surgeons in two  weeks.  She was doing better at that point, in terms of pain management and  was tolerating dressing changes.  She was up and ambulating.   DISCHARGE MEDICATIONS:  1.  OxyContin for pain management.  2.  Oxy IR for pain management.       JDS/MEDQ  D:  07/11/2004  T:  07/11/2004  Job:  [id number]

## 2010-06-16 NOTE — Op Note (Signed)
[name], [name]         ACCOUNT NO.:  [id number]   MEDICAL RECORD NO.:  [id number]          PATIENT TYPE:  INP   LOCATION:  3106                         FACILITY:  [facility]   PHYSICIAN:  Haywood Lasso, M.D.DATE OF BIRTH:  1950/11/26   DATE OF PROCEDURE:  04/05/2004  DATE OF DISCHARGE:                                 OPERATIVE REPORT   PREOPERATIVE DIAGNOSIS:  Fournier's gangrene, status post drainage and  debridement.   POSTOPERATIVE DIAGNOSIS:  Fournier's gangrene, status post drainage and  debridement.   OPERATION:  Wound dressing change with limited debridement and partial  closure of wound.   SURGEON:  Haywood Lasso, M.D.   ANESTHESIA:  General.   CLINICAL HISTORY:  This patient is a 60 year old lady who has been coming  back to the operating room for dressing changes following aggressive multi-  day debridement of Fournier's gangrene.  She has been clinically improving,  and the wound is, however, too big to do dressing changes in the patient's  room with no anesthesia.   DESCRIPTION OF PROCEDURE:  The patient was seen in the holding area, and she  had no further questions.  She was taken to the operating room and after  satisfactory general anesthesia had been attained, the area was prepped and  draped.  A time-out occurred.  The old dressings had been removed.   Basically the tissues were much more beefy-red, consistent with developing  granulation tissue consistent with good progress.  There was no additional  pus or significant necrosis.  There still was a little bit of thickened,  purulent material coating a few areas of the subcutaneous tissues.  In  addition, there seemed to be at the depth of the wound some periosteum that  was necrotic.   Once the dressings were removed and the area prepped, I irrigated the area  copiously to check it out to make sure there were no other collections.  I  sharply debrided some of the purulent material that  was superficial and  tried to get a little bit of the periosteum off as well.   Because of the way this incision was done, to debride this, it gaped open  fairly widely, and because some of the tissues both at the superior and  inferior ends of the wound were looking fairly healthy, I went ahead and put  two sutures at the superior and two sutures at the inferior ends of the  wound to close that down a little bit and, hopefully, make this a better  candidate for a V.A.C. in a couple of days.   Everything appeared to be dry.  I put moistened gauze in to pack it and  sterile dressings.   The patient tolerated the procedure well.  There were no operative  complications.  All counts were correct.      CJS/MEDQ  D:  04/05/2004  T:  04/05/2004  Job:  [id number]

## 2010-06-16 NOTE — Consult Note (Signed)
Julie Brewer, Julie Brewer         ACCOUNT NO.:  [id number]   MEDICAL RECORD NO.:  [id number]          PATIENT TYPE:  INP   LOCATION:  3024                         FACILITY:  [facility]   PHYSICIAN:  Odis Hollingshead, M.D.DATE OF BIRTH:  05/20/1950   DATE OF CONSULTATION:  04/01/2004  DATE OF DISCHARGE:                                   CONSULTATION   REASON FOR CONSULTATION:  Right vulvar abscess.   HISTORY OF PRESENT ILLNESS:  Julie Brewer is a 60 year old female who  underwent lumbar spine surgery 8 days ago. She began noticing some  discomfort and swelling in the right vulvar area in the mons pubis region  that increased. She has been having persistent fevers and has been treated  for pneumonia and seen by pulmonology. She then was noticed to have a  draining area in the vulva that her persistently drained, remained indurated  and red. A CT scan has been performed of the abdomen and pelvis, suggesting  that she has persistent pneumonia but also showing subcutaneous air and  inflammation in the mons pubis and right vulvar area. It was for this reason  that I was asked to see her. Of note, she has had a previous mons pubis  abscess that had to be drained by Dr. Johnathan Brewer in 2003.   PAST MEDICAL HISTORY:  1.  Insulin-dependent diabetes mellitus.  2.  Herniated nucleus pulposus and spondylolisthesis.  3.  Asthma.  4.  Diabetic retinopathy.  5.  Hypertension.  6.  Hypercholesterolemia.  7.  Mons pubis abscess in the past.   PAST SURGICAL HISTORY:  1.  Cervical neck surgery.  2.  Hysterectomy.  3.  Tonsillectomy.  4.  Incision and drainage of mons pubis abscess.   ALLERGIES:  PENICILLIN, MORPHINE.   CURRENT MEDICATIONS:  Zocor, Zetia, Avapro, hydrochlorothiazide, Protonix,  aspirin, Colace, Xopenex, Guaifenesin, sliding scale insulin, Lovenox,  Lantus insulin, Diflucan, Vancomycin, Ciprofloxacin, Metronidazole, Vicodin,  Valium, and Zofran.   SOCIAL HISTORY:  She is  a smoker. No alcohol use. Lives with her daughter.   REVIEW OF SYSTEMS:  HEART:  No known heart disease. PULMONARY:  She does  have a productive cough at times and shortness of breath with exertion.  GASTROINTESTINAL:  Has hiatal hernia with reflux.   PHYSICAL EXAMINATION:  GENERAL:  An uncomfortable appearing female lying in  the bed.  VITAL SIGNS:  Temperature has been as high as 102.1 degrees and was 99.8  over the past 24 hours. Vital signs have been stable except for an  intermittent tachycardia.  ABDOMEN:  Soft, slightly firm with some suprapubic tenderness to palpation.  GENITOURINARY:  The mons pubis is firm and indurated, especially on the  right side. The right vulva is firm, indurated and tender and posteriorly,  there is a small hole draining purulent fluid.   IMPRESSION:  Right vulvar abscess in a diabetic female. At risk for  potential necrotizing process. It has some spontaneous but incomplete  drainage.   PLAN:  To the operating room for incision and drainage and wide debridement  followed by wound packing. I did explain the procedure and risks to  her. She  is understanding of this and is agreeable to proceeding.      TJR/MEDQ  D:  04/01/2004  T:  04/02/2004  Job:  [id number]

## 2010-06-16 NOTE — Op Note (Signed)
NAMEMIAJA, Julie Brewer         ACCOUNT NO.:  [id number]   MEDICAL RECORD NO.:  [id number]          PATIENT TYPE:  INP   LOCATION:  3106                         FACILITY:  [facility]   PHYSICIAN:  Haywood Lasso, M.D.DATE OF BIRTH:  Nov 24, 1950   DATE OF PROCEDURE:  04/03/2004  DATE OF DISCHARGE:                                 OPERATIVE REPORT   PREOPERATIVE DIAGNOSIS:  Fournier's gangrene.   POSTOPERATIVE DIAGNOSIS:  Fournier's gangrene with small ongoing abscess.   OPERATION PERFORMED:  Dressing change under anesthesia with debridement of  skin and abscess, irrigation of wound.   SURGEON:  Haywood Lasso, M.D.   ANESTHESIA:  General.   INDICATIONS FOR PROCEDURE:  The patient is a 60 year old lady who has  developed a Fournier's gangrene.  She was operated on sequentially over  several days, the last of which was yesterday with a planned bring-back  today for dressing change and exam of the wound.   DESCRIPTION OF PROCEDURE:  The patient was seen in the holding areas and had  no further questions.  She was prepared to go back to the operating room.   The patient was brought to the operating room and after satisfactory general  anesthesia was obtained, the patient was placed in lithotomy, the old  dressing removed.  The perineal area was prepped and draped as a sterile  field.  Time out occurred.   Exam of the wound showed a little bit of very superficial necrotic fascia at  the very bottom which was sharply debrided.  There was ongoing small abscess  laterally just on the right, fairly high up with a little undermining of the  skin.  Most of the remaining of the wound was starting to develop some early  granulations.  I used cautery to excise the overlying skin over the area  where the abscess was so this now widely drained.  I then copiously  irrigated the entire area and made sure everything was dry.  We replaced the  dressing.  Since there was minimal ongoing  infection and no ongoing  necrosis, I plan to bring her back for a dressing change under anesthesia on  March 10.  At some point, we may be able to get this to where a VAC can be  placed.  The patient tolerated the procedure well.  There were no operative  complications.  All counts were correct.      CJS/MEDQ  D:  04/03/2004  T:  04/03/2004  Job:  [id number]

## 2010-06-16 NOTE — Op Note (Signed)
NAMEJENASIS, Julie Brewer         ACCOUNT NO.:  [id number]   MEDICAL RECORD NO.:  [id number]          PATIENT TYPE:  INP   LOCATION:  3106                         FACILITY:  [facility]   PHYSICIAN:  Sammuel Hines. Daiva Nakayama, M.D. DATE OF BIRTH:  [birth year]   DATE OF PROCEDURE:  04/09/2004  DATE OF DISCHARGE:                                 OPERATIVE REPORT   PREOPERATIVE DIAGNOSES:  Open right groin wound.   POSTOPERATIVE DIAGNOSES:  Open right groin wound.   OPERATION PERFORMED:  Removal of VAC sponge and dressing change.   SURGEON:  Sammuel Hines. Marlou Starks, M.D.   ANESTHESIA:  General via LMA.   DESCRIPTION OF PROCEDURE:  After informed consent was obtained, the patient  was brought to the operating room and placed in supine position on the bed.  After adequate induction of general anesthesia, the patient's right groin  wound was examined.  The VAC sponge was removed. The wound appeared to be  fairly clean.  The wound was then repacked with damp Kerlix and sterile  dressings were applied.  The patient tolerated the procedure well.  At the  end of the case, all sponge, needle and instrument counts were correct.  The  patient was then awakened and taken to recovery room in stable condition.      PST/MEDQ  D:  04/09/2004  T:  04/10/2004  Job:  [id number]

## 2010-06-16 NOTE — Op Note (Signed)
[place]  Patient:    Julie Brewer, Julie Brewer Visit Number: [id number] MRN: [id number]          Service Type: DSU Location: [facility] Attending Physician:  Joya San Dictated by:   Isabel Caprice Hassell Done, M.D. Proc. Date: 07/09/01 Admit Date:  07/09/2001 Discharge Date: 07/09/2001   CC:         Don Broach. Carlis Abbott, M.D.  Doran Heater. Warnell Forester, M.D.   Operative Report  PREOPERATIVE DIAGNOSIS:  Mons pubis abscess.  POSTOPERATIVE DIAGNOSIS:  Mons pubis abscess.  PROCEDURES PERFORMED:  Incision and drainage of mons pubis abscess.  SURGEON:  Isabel Caprice. Hassell Done, M.D.  ANESTHESIA:  General by LMA.  INDICATIONS:  The patient is a 60 year old woman who saw Dr. Warnell Forester two days ago and was found to have an abscess on her mons on the right side. She was placed on cephalexin 500 mg q.i.d. and Mepergan for pain. She saw Dr. Nedra Hai in the [place] surgeon office yesterday, and this area had drained spontaneously. The patient was unable to have this drained in the office and had just eaten, so she was scheduled for this morning.  DESCRIPTION OF THE PROCEDURE:  The patient was seen by me in the holding area and the procedure was discussed with her and she was examined. She was taken to room 8 and was placed in the dorsal lithotomy position after an LMA was secured and general anesthesia was administered. The perineum was prepped with Betadine and draped sterilely.  I made a longitudinal incision up following the previous spontaneous site of the abscess drainage, and opened this to be about at least one inch in length. I then inserted my finger and manually and digitally broke up some loculations in the whole entire indurated area. There was no crepitus that I found, and I did not see any evidence of ongoing gas-forming organisms. I carefully palpated the other side of the labia as well as the perineum and did not feel any other areas of induration.  Once  I was satisfied with this examination and breakdown of loculations, I irrigated the area with saline and packed it with half-inch iodoform gauze. A sterile dressing was applied with some web panties to hold this in place, and the patient was discharged.  Instructions were given to her family as well as were written. She is instructed to return to the office in two days, and to continue her cephalexin and use the Mepergan for pain as needed.  IMPRESSION:  Status post drainage of mons abscess. Dictated by:   Isabel Caprice Hassell Done, M.D. Attending Physician:  Joya San DD:  07/09/01 TD:  07/13/01 Job: [id number]

## 2010-06-16 NOTE — Discharge Summary (Signed)
[place]  Patient:    Julie Brewer, Julie Brewer                  MRN: [id number] Adm. Date:  08/12/00 Disc. Date: 08/17/00 Attending:  Melburn Hake, M.D.                           Discharge Summary  ADMITTING DIAGNOSES: 1. Acute pancreatitis. 2. Reflux esophagitis. 3. Hypertension. 4. Asthma.  HISTORY OF PRESENT ILLNESS:  This is a 60 year old, African-American female with uncontrolled diabetes.  She was complaining of right lower quadrant abdominal pain for two to three days with some substernal pain.  She has history of esophageal reflux.  She has had a hysterectomy and a bilateral salpingo-oophorectomy with both ovaries, tubes and the usual removed in 2000. She was seen by Dr. Warnell Forester.  She had large cysts and adhesions and she had some clots removed from colon by Dr. Amedeo Plenty.  Dr. Jeanann Lewandowsky has managed her diabetes in the past, but this pain was very severe in the right lower quadrant.  She had diarrhea, nausea and vomiting and could not keep anything down.  Her glucose was 409 with calcium 11.3.  Her hemoglobin A1C was 9.7. Her sugars have not been well-controlled for awhile.  BUN was 18 and creatinine 1.1.  PHYSICAL EXAMINATION:  VITAL SIGNS:  Temperature 99.3, pulse 120, respiratory rate 16, blood pressure 152/93.  Weight 152 pounds in the office.  GENERAL:  She was alert and oriented x 3.  HEENT:  Negative.  CHEST:  Clear.  HEART:  Mild tachycardia.  ABDOMEN:  Decreased bowel sounds with pain to palpation in the abdomen in lower quadrant.  Pain in the right flank to palpation.  LABORATORY DATA AND X-RAY FINDINGS:  Amylase 545, lipase 1743.  The patient has acute pancreatitis with persistently uncontrolled glucoses.  Hemoglobin A1C was 10.3.  Glucose was 303.  Lipid profile with LDL cholesterol of 187, cholesterol 289.  CT of her pelvis showed that she had had a hysterectomy and appendectomy.  Spiral CT was performed which  was negative.  Abdominal ultrasound was negative, possibly slightly enlarged kidneys.  HOSPITAL COURSE:  The patient had a combination of elevated cholesterol, hypertension, acute pancreatitis and uncontrolled diabetes.  The patient was put to bed rest and started on clear liquids and a sliding scale for her sugars.  The patients blood pressure came down and gradually her pancreatitis, on clear liquids, began to improve.  However, when we gradually moved her to solid food she had more pain.  Her blood pressure dropped down to 80/57.  Gradually, the amylase kept coming down.  She also has lactose intolerance and was on an 1800 calorie, ADA, lactose-free, low fat diet.  Her lipase and amylase continued to come down until the lipase was down to 100 and her amylase was to 101.  She had no abdominal pains.  Although her blood sugars were still elevated, we increased her Lantus to 25 units at q.h.s.  Her blood pressure was down to 130/60.  We told her to stop smoking and started her on Avandia 8 mg daily, Lipitor 20 mg a day.  We continued her Flovent inhaler and Reglan 10 mg q.a.c. and q.h.s., Nexium 40 mg a day, MiraLax for constipation and Lotensin 5 mg.  As an outpatient, if her pressure goes up, we will increase that and will test her sliding scale as an outpatient.  The  patient is to come to the office in about two weeks. DD:  09/08/00 TD:  09/09/00 Job: [id number]

## 2010-06-16 NOTE — Op Note (Signed)
Julie Brewer, Julie Brewer         ACCOUNT NO.:  [id number]   MEDICAL RECORD NO.:  [id number]          PATIENT TYPE:  INP   LOCATION:  3106                         FACILITY:  [facility]   PHYSICIAN:  Haywood Lasso, M.D.DATE OF BIRTH:  April 29, 1950   DATE OF PROCEDURE:  04/07/2004  DATE OF DISCHARGE:                                 OPERATIVE REPORT   PREOPERATIVE DIAGNOSES:  Open groin wound secondary to Fournier's gangrene.   POSTOPERATIVE DIAGNOSES:  Open groin wound secondary to Fournier's gangrene.   OPERATION:  Dressing change under anesthesia with application of VAC.   SURGEON:  Haywood Lasso, MD.   ANESTHESIA:  General.   CLINICAL HISTORY:  This patient has been coming to the operating room on  alternate days for dressing changes.  She was brought back today for another  dressing change and possible application of VAC.   DESCRIPTION OF PROCEDURE:  The patient was seen in the holding area and she  had no further questions.   She was taken to the operating room and after satisfactory general  anesthesia had been obtained, the old dressings were removed and the groin  clipped, prepped, and draped.  A timeout occurred.   The old dressing was pulled out, and the wound basically was clean with the  exception of some areas that looked like they were periosteum in the very  depths of the wound.  Some of the ends of the wound looked fairly healthy.   I copiously irrigated the area to make sure there was no debris and then put  another suture in at the very top and the very bottom of the wound to try to  diminish the length of the wound a little bit.  Where we had done a lateral  incision, I also put one suture to diminish that wound as these areas looked  clean and I thought we could at least get the skin starting to close  together in these areas.   We then took a medium VAC sponge and cut it to shape and fit it in.  This  was a very awkward area to work, although we  were able to get a good seal  using several pieces of plastic and get suction applied.   The patient tolerated the procedure well.  There were no operative  complications.  All counts were correct.      CJS/MEDQ  D:  04/07/2004  T:  04/07/2004  Job:  [id number]

## 2010-06-16 NOTE — Op Note (Signed)
NAMEKEHLANIE, TENSLEY         ACCOUNT NO.:  [id number]   MEDICAL RECORD NO.:  [id number]          PATIENT TYPE:  OIB   LOCATION:  3004                         FACILITY:  [facility]   PHYSICIAN:  Marchia Meiers. Vertell Limber, M.D.  DATE OF BIRTH:  1951/01/20   DATE OF PROCEDURE:  03/24/2004  DATE OF DISCHARGE:                                 OPERATIVE REPORT   PREOPERATIVE DIAGNOSES:  1.  Far lateral disc herniation L3-4 left.  2.  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   POSTOPERATIVE DIAGNOSES:  1.  Far lateral disc herniation L3-4 left.  2.  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   PROCEDURES:  1.  Far lateral microdiscectomy L3-4 left.  2.  Bilateral laminoforaminotomies L4-5.  3.  Microdissection.   SURGEON:  Marchia Meiers. Vertell Limber, M.D.   ASSISTANT:  Otilio Connors, M.D.   ANESTHESIA:  General endotracheal anesthesia.   ESTIMATED BLOOD LOSS:  Minimal.   COMPLICATIONS:  None.   DISPOSITION:  Recovery.   INDICATIONS FOR PROCEDURE:  Ciarrah Pettersson is a 60 year old woman with  diabetes who has significant left leg pain and has a far lateral disc  herniation L3-4 on the left and has central to right-sided disc herniation  to L4-5 with significant spondylosis.  It was elected to take her to surgery  for far lateral microdiscectomy L3-4 left and also bilateral  laminoforaminotomies L4-5 with possible microdiscectomy on the right at L4-  5.   DESCRIPTION OF PROCEDURE:  Ms. Fingar was brought to the operating  room.  Following satisfactory and uncomplicated induction of general  endotracheal anesthesia and placement of intravenous lines, the patient was  placed in the prone position on the Wilson frame.  Her low back was then  prepped and draped in the usual sterile fashion.  The area of planned  incision was infiltrated with 0.25% Marcaine and 0.5 lidocaine with  1:200,000 epinephrine.  Incision  was made in the midline, carried through to  the adipose tissue to the lumbodorsal fascia which was incised bilaterally  including the L4-5 and L3-4 levels.  A marker probe was placed on the L3  transverse process and the L4-5 interlaminar space and this was confirmed on  intraoperative x-ray.  Subsequently, hemisemilaminectomy of L4 was performed  bilaterally using high speed drill and Kerrison rongeurs and ligamentum  flavum was detached and removed in a piecemeal  fashion on the left at L3-4  of pars intra-articularis was thinned and then lateral aspect removed with  high speed drill and Kerrison rongeurs.  The intertransverse ligament was  then removed resulting in decompression of the left L3 nerve root  extraforaminally.  A large fragment of disc material was then removed from  just beneath and caudad to the L3 nerve root resulting in significant  decompression of the nerve root.  Hemostasis was assured with Gelfoam soaked  in thrombin.  Microdissection confirmed adequate decompression of the L3  nerve root as it extended in the extraforaminal space.  Microscope was then  brought into the field and the  canal at the L4-5 level was palpated.  There  was a fairly spondylitic disc herniation without soft disc material and it  was felt that after generous foraminotomy, there was no significant  persistent compression of the nerve roots.  Consequently it was elected not  to perform microdiscectomy on either side.  Hemostasis was again assured at  this level as well.  The wound was then copiously irrigated with Bacitracin  saline.  The lumbodorsal fascia was closed with 0 Vicryl suture.  Subcutaneous tissues reapproximated with 2-0 Vicryl interrupted inverted  sutures and the skin edges were reapproximated with interrupted 3-0 Vicryl  subcuticular stitch.  The wound was dressed with Dermabond.  The patient was  extubated in the operating room and taken to the recovery room in stable and   satisfactory condition having tolerated the operation well.  Counts were  correct at the end of the case.      JDS/MEDQ  D:  03/24/2004  T:  03/24/2004  Job:  [id number]

## 2010-08-07 ENCOUNTER — Encounter (INDEPENDENT_AMBULATORY_CARE_PROVIDER_SITE_OTHER): Payer: Medicare Other

## 2010-08-07 ENCOUNTER — Ambulatory Visit (INDEPENDENT_AMBULATORY_CARE_PROVIDER_SITE_OTHER): Payer: Medicare Other | Admitting: Surgery

## 2010-08-07 DIAGNOSIS — I739 Peripheral vascular disease, unspecified: Secondary | ICD-10-CM

## 2010-08-07 DIAGNOSIS — I70219 Atherosclerosis of native arteries of extremities with intermittent claudication, unspecified extremity: Secondary | ICD-10-CM

## 2010-08-08 NOTE — Assessment & Plan Note (Signed)
OFFICE VISIT  ZARIHA, KOMOROSKI DOB:  1950/09/14                                       08/07/2010 [id number]  The patient comes back today for followup of her claudication.  I last saw her 1 year ago and at that time she was complaining of bilateral leg aches, and her left leg giving out at about a block.  She is also being worked up for a stroke, according to her she had a carotid duplex which showed no carotid disease.  She has been stable from her stroke and had no new events.  I did start her on cilostazol when I last saw her.  She states that she no longer has any leg complaints. Her leg does not give out and she does not get cramps.  PHYSICAL EXAMINATION:  Vital signs:  Heart rate 89, blood pressure 112/70, respiratory rate 16.  General:  She is well-appearing, in no distress.  Respirations:  Nonlabored.  Abdomen:  Soft.  Femoral pulses are palpable.  Neurological:  Intact.  DIAGNOSTIC STUDIES:  ABI on the right is 1.0, on the left is 0.65.  ASSESSMENT AND PLAN:  Peripheral vascular disease, asymptomatic.  I have refilled her cilostazol.  We will continue to treat her medically as she is asymptomatic at this time.  I discussed that if she were to develop progression of her disease she may begin to develop symptoms again.  If that happens between now and the next time I see her she will contact my office.  Otherwise I will see her back in 1 year.    Eldridge Abrahams, MD Electronically Signed  VWB/MEDQ  D:  08/07/2010  T:  08/08/2010  Job:  [id number]  cc:   Dr Chestine Spore, M.D.

## 2010-10-27 LAB — CBC
Hemoglobin: 12.2
RBC: 4.53

## 2010-10-27 LAB — BASIC METABOLIC PANEL
Calcium: 9.7
GFR calc Af Amer: >60
GFR calc non Af Amer: >60
Sodium: 140

## 2011-06-29 ENCOUNTER — Other Ambulatory Visit: Payer: Self-pay | Admitting: Nephrology

## 2011-07-30 ENCOUNTER — Other Ambulatory Visit: Payer: Self-pay | Admitting: Nephrology

## 2011-08-03 ENCOUNTER — Encounter: Payer: Self-pay | Admitting: Surgery

## 2011-08-13 ENCOUNTER — Ambulatory Visit: Payer: Medicare Other | Admitting: Surgery

## 2011-08-17 ENCOUNTER — Encounter: Payer: Self-pay | Admitting: Neurosurgery

## 2011-08-20 ENCOUNTER — Ambulatory Visit (INDEPENDENT_AMBULATORY_CARE_PROVIDER_SITE_OTHER): Payer: PRIVATE HEALTH INSURANCE | Admitting: Neurosurgery

## 2011-08-20 ENCOUNTER — Encounter: Payer: Self-pay | Admitting: Neurosurgery

## 2011-08-20 ENCOUNTER — Ambulatory Visit (INDEPENDENT_AMBULATORY_CARE_PROVIDER_SITE_OTHER): Payer: PRIVATE HEALTH INSURANCE | Admitting: *Deleted

## 2011-08-20 VITALS — BP 152/79 | HR 71 | Resp 16 | Ht 63.25 in | Wt 151.3 lb

## 2011-08-20 DIAGNOSIS — I70219 Atherosclerosis of native arteries of extremities with intermittent claudication, unspecified extremity: Secondary | ICD-10-CM

## 2011-08-20 DIAGNOSIS — I739 Peripheral vascular disease, unspecified: Secondary | ICD-10-CM

## 2011-08-20 NOTE — Progress Notes (Signed)
[facility] Office Note  CC: Lower extremity ABIs Referring Physician: Brabham  History of Present Illness: 61 year old female patient of Dr. Trula Slade followed for a history of lower extremity pain questionable claudication. The patient reports no lower extremity pain, claudication, rest pain or any open lower extremity ulcers. The patient states she did have an episode of "chest pain" last week but it was short-lived and has not recurred. The patient denies any new medical diagnoses or any recent surgery.  Past Medical History  Diagnosis Date  . Hypertension   . Hyperlipidemia   . Diabetes mellitus   . Peripheral vascular disease   . Stroke     ROS: [x]  Positive   [ ]  Denies    General: [ ]  Weight loss, [ ]  Fever, [ ]  chills Neurologic: [ ]  Dizziness, [ ]  Blackouts, [ ]  Seizure [ ]  Stroke, [ ]  "Mini stroke", [ ]  Slurred speech, [ ]  Temporary blindness; [ ]  weakness in arms or legs, [ ]  Hoarseness Cardiac: [ ]  Chest pain/pressure, [ ]  Shortness of breath at rest [ ]  Shortness of breath with exertion, [ ]  Atrial fibrillation or irregular heartbeat Vascular: [ ]  Pain in legs with walking, [ ]  Pain in legs at rest, [ ]  Pain in legs at night,  [ ]  Non-healing ulcer, [ ]  Blood clot in vein/DVT,   Pulmonary: [ ]  Home oxygen, [ ]  Productive cough, [ ]  Coughing up blood, [ ]  Asthma,  [ ]  Wheezing Musculoskeletal:  [ ]  Arthritis, [ ]  Low back pain, [ ]  Joint pain Hematologic: [ ]  Easy Bruising, [ ]  Anemia; [ ]  Hepatitis Gastrointestinal: [ ]  Blood in stool, [ ]  Gastroesophageal Reflux/heartburn, [ ]  Trouble swallowing Urinary: [ ]  chronic Kidney disease, [ ]  on HD - [ ]  MWF or [ ]  TTHS, [ ]  Burning with urination, [ ]  Difficulty urinating Skin: [ ]  Rashes, [ ]  Wounds Psychological: [ ]  Anxiety, [ ]  Depression   Social History History  Substance Use Topics  . Smoking status: Current Everyday Smoker -- 1.0 packs/day    Types: Cigarettes  . Smokeless  tobacco: Not on file  . Alcohol Use: No    Family History Family History  Problem Relation Age of Onset  . Cancer Mother   . Heart attack Mother   . Diabetes Father   . Cancer Sister     Allergies  Allergen Reactions  . Morphine And Related Other (See Comments)    Hallucenations   . Penicillins     Current Outpatient Prescriptions  Medication Sig Dispense Refill  . aspirin 325 MG EC tablet Take 325 mg by mouth daily.      . cilostazol (PLETAL) 100 MG tablet Take 100 mg by mouth 2 (two) times daily.      . cloNIDine (CATAPRES) 0.3 MG tablet Take 0.3 mg by mouth 2 (two) times daily.      Marland Kitchen gabapentin (NEURONTIN) 100 MG capsule Take 100 mg by mouth 2 (two) times daily.      . insulin detemir (LEVEMIR) 100 UNIT/ML injection Inject into the skin at bedtime. As directed      . metoCLOPramide (REGLAN) 10 MG tablet Take 10 mg by mouth 4 (four) times daily.      Marland Kitchen omeprazole (PRILOSEC) 20 MG capsule Take 20 mg by mouth daily.        Physical Examination  Filed Vitals:   08/20/11 1516  BP: 152/79  Pulse: 71  Resp: 16  Body mass index is 26.59 kg/(m^2).  General:  WDWN in NAD Gait: Normal HEENT: WNL Eyes: Pupils equal Pulmonary: normal non-labored breathing , without Rales, rhonchi,  wheezing Cardiac: RRR, without  Murmurs, rubs or gallops; No carotid bruits Abdomen: soft, NT, no masses Skin: no rashes, ulcers noted Vascular Exam/Pulses: Femoral pulses are palpable bilaterally, PT pulses are palpable bilaterally  Extremities without ischemic changes, no Gangrene , no cellulitis; no open wounds;  Musculoskeletal: no muscle wasting or atrophy  Neurologic: A&O X 3; Appropriate Affect ; SENSATION: normal; MOTOR FUNCTION:  moving all extremities equally. Speech is fluent/normal  Non-Invasive Vascular Imaging: ABIs are improved from last year, on the right she is 1.16 and triphasic, on the left she is 0.70 and monophasic  ASSESSMENT/PLAN: Asymptomatic patient with history  of mild claudication complaints, she will followup in one year with repeat ABIs, her questions were encouraged and answered. She is in agreement with this plan.  Beatris Ship ANP  Clinic M.D.: Trula Slade

## 2011-08-20 NOTE — Addendum Note (Signed)
Addended by: Mena Goes on: 08/20/2011 03:41 PM   Modules accepted: Orders

## 2011-10-06 ENCOUNTER — Emergency Department (HOSPITAL_COMMUNITY)
Admission: EM | Admit: 2011-10-06 | Discharge: 2011-10-06 | Disposition: A | Discharge status: Home / Self Care | Payer: Medicare Other | Attending: Emergency Medicine | Admitting: Emergency Medicine

## 2011-10-06 ENCOUNTER — Encounter (HOSPITAL_COMMUNITY): Payer: Self-pay | Admitting: Emergency Medicine

## 2011-10-06 DIAGNOSIS — Z880 Allergy status to penicillin: Secondary | ICD-10-CM | POA: Insufficient documentation

## 2011-10-06 DIAGNOSIS — E119 Type 2 diabetes mellitus without complications: Secondary | ICD-10-CM | POA: Insufficient documentation

## 2011-10-06 DIAGNOSIS — F17200 Nicotine dependence, unspecified, uncomplicated: Secondary | ICD-10-CM | POA: Insufficient documentation

## 2011-10-06 DIAGNOSIS — R220 Localized swelling, mass and lump, head: Secondary | ICD-10-CM

## 2011-10-06 DIAGNOSIS — I10 Essential (primary) hypertension: Secondary | ICD-10-CM | POA: Insufficient documentation

## 2011-10-06 DIAGNOSIS — Z7982 Long term (current) use of aspirin: Secondary | ICD-10-CM | POA: Insufficient documentation

## 2011-10-06 DIAGNOSIS — R221 Localized swelling, mass and lump, neck: Secondary | ICD-10-CM | POA: Insufficient documentation

## 2011-10-06 DIAGNOSIS — Z794 Long term (current) use of insulin: Secondary | ICD-10-CM | POA: Insufficient documentation

## 2011-10-06 DIAGNOSIS — Z833 Family history of diabetes mellitus: Secondary | ICD-10-CM | POA: Insufficient documentation

## 2011-10-06 DIAGNOSIS — Z8249 Family history of ischemic heart disease and other diseases of the circulatory system: Secondary | ICD-10-CM | POA: Insufficient documentation

## 2011-10-06 DIAGNOSIS — Z885 Allergy status to narcotic agent status: Secondary | ICD-10-CM | POA: Insufficient documentation

## 2011-10-06 DIAGNOSIS — Z809 Family history of malignant neoplasm, unspecified: Secondary | ICD-10-CM | POA: Insufficient documentation

## 2011-10-06 MED ORDER — ERYTHROMYCIN BASE 333 MG PO TBEC: 333.0000 mg | DELAYED_RELEASE_TABLET | Freq: Three times a day (TID) | ORAL | Status: AC

## 2011-10-06 NOTE — ED Provider Notes (Signed)
History   This chart was scribed for Veryl Speak, MD by Shona Needles. The patient was seen in room TR07C/TR07C. Patient's care was started at 1254.  CSN: [id number]  Arrival date & time 10/06/11  1254   First MD Initiated Contact with Patient 10/06/11 1427      Chief Complaint  Patient presents with  . Facial Swelling   The history is provided by the patient and a relative. No language interpreter was used.    Julie Brewer is a 61 y.o. female wiwho presents to the Emergency Department complaining of 24 hours of sudden onset moderate facial swelling. Swelling has moderately increased in the past Pt denotes associate rash to the left arm and back swelling. She denies pain, fever, chills, emesis, nausea, and cough. Pt denies SOB, dysphagia, fever, cough, and rhinorrhea.   Past Medical History  Diagnosis Date  . Hypertension   . Hyperlipidemia   . Diabetes mellitus   . Peripheral vascular disease   . Stroke     Past Surgical History  Procedure Date  . Refractive surgery     both eyes  . Back surgery     X2  . Abdominal hysterectomy     Family History  Problem Relation Age of Onset  . Cancer Mother   . Heart attack Mother   . Diabetes Father   . Cancer Sister     History  Substance Use Topics  . Smoking status: Current Everyday Smoker -- 1.0 packs/day    Types: Cigarettes  . Smokeless tobacco: Not on file  . Alcohol Use: No   Review of Systems  Skin:       Abscess  All other systems reviewed and are negative.    Allergies  Morphine and related and Penicillins  Home Medications   Current Outpatient Rx  Name Route Sig Dispense Refill  . ASPIRIN 325 MG PO TBEC Oral Take 325 mg by mouth daily.    Marland Kitchen CILOSTAZOL 100 MG PO TABS Oral Take 100 mg by mouth 2 (two) times daily.    Marland Kitchen CLONIDINE HCL 0.1 MG PO TABS Oral Take 0.1 mg by mouth 2 (two) times daily.    Marland Kitchen GABAPENTIN 100 MG PO CAPS Oral Take 100 mg by mouth 2 (two) times daily.    . INSULIN DETEMIR  100 UNIT/ML [place] SOLN Subcutaneous Inject 26-56 Units into the skin 2 (two) times daily with a meal. As directed    . INSULIN REGULAR HUMAN 100 UNIT/ML IJ SOLN Subcutaneous Inject 7-15 Units into the skin 2 (two) times daily before a meal.    . LISINOPRIL 5 MG PO TABS Oral Take 5 mg by mouth daily.    Marland Kitchen METFORMIN HCL ER 750 MG PO TB24 Oral Take 750 mg by mouth daily.    Marland Kitchen METOCLOPRAMIDE HCL 10 MG PO TABS Oral Take 10 mg by mouth 4 (four) times daily.    Marland Kitchen OMEPRAZOLE 20 MG PO CPDR Oral Take 20 mg by mouth daily.    Marland Kitchen SIMVASTATIN 40 MG PO TABS Oral Take 40 mg by mouth every evening.      BP 145/57  Pulse 105  Temp 98.1 F (36.7 C) (Oral)  Resp 20  SpO2 99%  Physical Exam  Nursing note and vitals reviewed. Constitutional: She appears well-developed and well-nourished. No distress.  HENT:  Head: Normocephalic and atraumatic.  Right Ear: External ear normal.  Left Ear: External ear normal.       Mod swelling to the right  cheek. Dentures that appear well.  Eyes: Conjunctivae are normal. Right eye exhibits no discharge. Left eye exhibits no discharge. No scleral icterus.  Neck: Neck supple. No tracheal deviation present.  Cardiovascular: Normal rate.   Pulmonary/Chest: Effort normal. No stridor. No respiratory distress.  Musculoskeletal: She exhibits no edema.  Neurological: She is alert. Cranial nerve deficit: no gross deficits.  Skin: Skin is warm and dry. No rash noted.  Psychiatric: She has a normal mood and affect.    ED Course  Procedures (including critical care time) DIAGNOSTIC STUDIES: Oxygen Saturation is 99% on room air, normal by my interpretation.    COORDINATION OF CARE: 14:29- Evaluated Pt. Pt is awake, alert, and oriented.  Labs Reviewed - No data to display No results found.   No diagnosis found.    MDM  The patient presents with swelling to the right side of the face which is certainly dental in etiology.  Will treat with antibiotics, follow up prn if she  worsens.       I personally performed the services described in this documentation, which was scribed in my presence. The recorded information has been reviewed and considered.        Veryl Speak, MD 10/06/11 [phone]

## 2011-10-06 NOTE — ED Notes (Signed)
Patient and family encouraged to return if sx worsen, sob, or diff swallowing

## 2011-10-06 NOTE — ED Notes (Signed)
Pt. Stated, my rt. Side of my face   started to be puffy yesterday and now its real swollen to were my eye is almost shut.

## 2012-03-26 ENCOUNTER — Other Ambulatory Visit: Payer: Self-pay | Admitting: Family Medicine

## 2012-03-26 DIAGNOSIS — Z1231 Encounter for screening mammogram for malignant neoplasm of breast: Secondary | ICD-10-CM

## 2012-04-28 ENCOUNTER — Ambulatory Visit
Admission: RE | Admit: 2012-04-28 | Discharge: 2012-04-28 | Disposition: A | Discharge status: Home / Self Care | Payer: Medicare Other | Source: Ambulatory Visit | Attending: Family Medicine | Admitting: Family Medicine

## 2012-04-28 ENCOUNTER — Other Ambulatory Visit: Payer: Self-pay | Admitting: Family Medicine

## 2012-04-28 DIAGNOSIS — Z1231 Encounter for screening mammogram for malignant neoplasm of breast: Secondary | ICD-10-CM

## 2012-04-28 DIAGNOSIS — R928 Other abnormal and inconclusive findings on diagnostic imaging of breast: Secondary | ICD-10-CM

## 2012-05-08 ENCOUNTER — Ambulatory Visit
Admission: RE | Admit: 2012-05-08 | Discharge: 2012-05-08 | Disposition: A | Discharge status: Home / Self Care | Payer: Medicare Other | Source: Ambulatory Visit | Attending: Family Medicine | Admitting: Family Medicine

## 2012-05-08 ENCOUNTER — Other Ambulatory Visit: Payer: Self-pay | Admitting: Family Medicine

## 2012-05-08 DIAGNOSIS — R921 Mammographic calcification found on diagnostic imaging of breast: Secondary | ICD-10-CM

## 2012-05-08 DIAGNOSIS — R928 Other abnormal and inconclusive findings on diagnostic imaging of breast: Secondary | ICD-10-CM

## 2012-05-15 ENCOUNTER — Ambulatory Visit
Admission: RE | Admit: 2012-05-15 | Discharge: 2012-05-15 | Disposition: A | Discharge status: Home / Self Care | Payer: Medicare Other | Source: Ambulatory Visit | Attending: Family Medicine | Admitting: Family Medicine

## 2012-05-15 DIAGNOSIS — R921 Mammographic calcification found on diagnostic imaging of breast: Secondary | ICD-10-CM

## 2012-07-04 ENCOUNTER — Encounter: Payer: Self-pay | Admitting: *Deleted

## 2012-07-09 HISTORY — PX: COLONOSCOPY: SHX174

## 2012-08-25 ENCOUNTER — Ambulatory Visit: Payer: PRIVATE HEALTH INSURANCE | Admitting: Neurosurgery

## 2012-08-29 ENCOUNTER — Encounter: Payer: Self-pay | Admitting: Surgery

## 2012-09-01 ENCOUNTER — Ambulatory Visit (INDEPENDENT_AMBULATORY_CARE_PROVIDER_SITE_OTHER): Payer: Medicare Other | Admitting: Surgery

## 2012-09-01 ENCOUNTER — Encounter (INDEPENDENT_AMBULATORY_CARE_PROVIDER_SITE_OTHER): Payer: Medicare Other | Admitting: *Deleted

## 2012-09-01 ENCOUNTER — Encounter: Payer: Self-pay | Admitting: Surgery

## 2012-09-01 VITALS — BP 145/76 | HR 84 | Resp 16 | Ht 63.25 in | Wt 150.0 lb

## 2012-09-01 DIAGNOSIS — I739 Peripheral vascular disease, unspecified: Secondary | ICD-10-CM

## 2012-09-01 DIAGNOSIS — I70219 Atherosclerosis of native arteries of extremities with intermittent claudication, unspecified extremity: Secondary | ICD-10-CM

## 2012-09-01 NOTE — Progress Notes (Signed)
[facility]   Patient name: Julie Brewer MRN: [id number] DOB: 07-01-1950 Sex: female     Chief Complaint  Patient presents with  . PVD    Vascular lab study today.    HISTORY OF PRESENT ILLNESS: The patient is back today for followup of her peripheral vascular disease. When I initially met her in 2011 she was having claudication in her left leg at approximately one block. We treated her with cilostazol, and she did much better. She no longer has claudication, however she is not walking a significant distance because of her vision loss.  She continues to be medically managed for hypertension and hyperlipidemia as well as her diabetes. She is on a statin as well as an ACE inhibitor.  Past Medical History  Diagnosis Date  . Hypertension   . Hyperlipidemia   . Diabetes mellitus   . Peripheral vascular disease   . Stroke     Past Surgical History  Procedure Laterality Date  . Refractive surgery      both eyes  . Back surgery      X2  . Abdominal hysterectomy    . Colonoscopy  July 09, 2012    History   Social History  . Marital Status: Legally Separated    Spouse Name: N/A    Number of Children: N/A  . Years of Education: N/A   Occupational History  . Not on file.   Social History Main Topics  . Smoking status: Current Every Day Smoker -- 1.00 packs/day    Types: Cigarettes  . Smokeless tobacco: Never Used  . Alcohol Use: No  . Drug Use: No  . Sexually Active: Not on file   Other Topics Concern  . Not on file   Social History Narrative  . No narrative on file    Family History  Problem Relation Age of Onset  . Cancer Mother   . Heart attack Mother   . Diabetes Father   . Cancer Sister     Allergies as of 09/01/2012 - Review Complete 09/01/2012  Allergen Reaction Noted  . Morphine and related Other (See Comments) 08/03/2011  . Penicillins Rash 08/03/2011    Current Outpatient Prescriptions on File Prior to  Visit  Medication Sig Dispense Refill  . aspirin 325 MG EC tablet Take 325 mg by mouth daily.      . cilostazol (PLETAL) 100 MG tablet Take 100 mg by mouth 2 (two) times daily.      . cloNIDine (CATAPRES) 0.1 MG tablet Take 0.1 mg by mouth 2 (two) times daily.      Marland Kitchen gabapentin (NEURONTIN) 100 MG capsule Take 100 mg by mouth 2 (two) times daily.      . insulin detemir (LEVEMIR) 100 UNIT/ML injection Inject 26-56 Units into the skin 2 (two) times daily with a meal. As directed      . insulin regular (NOVOLIN R,HUMULIN R) 100 units/mL injection Inject 7-15 Units into the skin 2 (two) times daily before a meal.      . lisinopril (PRINIVIL,ZESTRIL) 5 MG tablet Take 5 mg by mouth daily.      . metFORMIN (GLUCOPHAGE-XR) 750 MG 24 hr tablet Take 750 mg by mouth daily.      . metoCLOPramide (REGLAN) 10 MG tablet Take 10 mg by mouth 4 (four) times daily.      Marland Kitchen omeprazole (PRILOSEC) 20 MG capsule Take 20 mg by mouth daily.      . simvastatin (ZOCOR)  40 MG tablet Take 40 mg by mouth every evening.       No current facility-administered medications on file prior to visit.     REVIEW OF SYSTEMS: All systems are negative according to the patient encounter form  PHYSICAL EXAMINATION:   Vital signs are BP 145/76  Pulse 84  Resp 16  Ht 5' 3.25" (1.607 m)  Wt 150 lb (68.04 kg)  BMI 26.35 kg/m2  SpO2 98% General: The patient appears their stated age. HEENT:  No gross abnormalities Pulmonary:  Non labored breathing Musculoskeletal: There are no major deformities. Neurologic: No focal weakness or paresthesias are detected, Skin: There are no ulcer or rashes noted. Psychiatric: The patient has normal affect. Cardiovascular: There is a regular rate and rhythm without significant murmur appreciated. Palpable right dorsalis pedis pulse. Nonpalpable left   Diagnostic Studies ABIs were performed today. The right is 1.0 with triphasic waveforms. The left is 0.7 with biphasic  waveforms  Assessment: Peripheral vascular disease with left leg claudication Plan: The patient's symptoms have remained stable. She is essentially asymptomatic, however she does not ambulate long distances. She has no ulcers. She does not have rest pain. For that reason I recommend continued medical management. She will contact me if she develops a nonhealing wound, otherwise she will follow up in one year with a repeat ABI. She will continue to take her cilostazol.  Eldridge Abrahams, M.D. [facility] Office: [phone] Pager:  [phone]

## 2012-09-01 NOTE — Addendum Note (Signed)
Addended by: Dorthula Rue L on: 09/01/2012 03:54 PM   Modules accepted: Orders

## 2012-09-08 ENCOUNTER — Telehealth: Payer: Self-pay | Admitting: Endocrinology

## 2012-09-08 ENCOUNTER — Other Ambulatory Visit: Payer: Self-pay | Admitting: *Deleted

## 2012-09-08 DIAGNOSIS — I70219 Atherosclerosis of native arteries of extremities with intermittent claudication, unspecified extremity: Secondary | ICD-10-CM

## 2012-09-08 MED ORDER — CLONIDINE HCL 0.1 MG PO TABS: 0.1000 mg | ORAL_TABLET | Freq: Two times a day (BID) | ORAL | Status: DC

## 2012-09-08 MED ORDER — CILOSTAZOL 100 MG PO TABS: 100.0000 mg | ORAL_TABLET | Freq: Two times a day (BID) | ORAL | Status: DC

## 2012-09-08 MED ORDER — SIMVASTATIN 40 MG PO TABS: 40.0000 mg | ORAL_TABLET | Freq: Every evening | ORAL | Status: DC

## 2012-09-08 NOTE — Telephone Encounter (Signed)
rx has been sent 

## 2012-09-22 ENCOUNTER — Other Ambulatory Visit: Payer: Self-pay | Admitting: *Deleted

## 2012-09-22 DIAGNOSIS — E785 Hyperlipidemia, unspecified: Secondary | ICD-10-CM | POA: Insufficient documentation

## 2012-09-22 DIAGNOSIS — E119 Type 2 diabetes mellitus without complications: Secondary | ICD-10-CM

## 2012-09-30 ENCOUNTER — Other Ambulatory Visit (INDEPENDENT_AMBULATORY_CARE_PROVIDER_SITE_OTHER): Payer: Medicare Other

## 2012-09-30 DIAGNOSIS — E119 Type 2 diabetes mellitus without complications: Secondary | ICD-10-CM

## 2012-09-30 DIAGNOSIS — E785 Hyperlipidemia, unspecified: Secondary | ICD-10-CM

## 2012-09-30 LAB — URINALYSIS, ROUTINE W REFLEX MICROSCOPIC
Ketones, ur: NEGATIVE
Specific Gravity, Urine: 1.02 (ref 1.000–1.030)
Total Protein, Urine: 100
Urine Glucose: NEGATIVE
Urobilinogen, UA: 0.2 (ref 0.0–1.0)
pH: 6.5 (ref 5.0–8.0)

## 2012-09-30 LAB — COMPREHENSIVE METABOLIC PANEL
ALT: 15 U/L (ref 0–35)
AST: 14 U/L (ref 0–37)
Alkaline Phosphatase: 66 U/L (ref 39–117)
BUN: 11 mg/dL (ref 6–23)
Calcium: 9.3 mg/dL (ref 8.4–10.5)
Chloride: 103 mEq/L (ref 96–112)
Creatinine, Ser: 1 mg/dL (ref 0.4–1.2)

## 2012-09-30 LAB — LIPID PANEL
Cholesterol: 129 mg/dL (ref 0–200)
LDL Cholesterol: 52 mg/dL (ref 0–99)
Total CHOL/HDL Ratio: 2
Triglycerides: 94 mg/dL (ref 0.0–149.0)

## 2012-09-30 LAB — MICROALBUMIN / CREATININE URINE RATIO
Creatinine,U: 83.9 mg/dL
Microalb Creat Ratio: 90.8 mg/g — ABNORMAL HIGH (ref 0.0–30.0)
Microalb, Ur: 76.2 mg/dL — ABNORMAL HIGH (ref 0.0–1.9)

## 2012-10-02 ENCOUNTER — Other Ambulatory Visit: Payer: Self-pay | Admitting: *Deleted

## 2012-10-02 ENCOUNTER — Ambulatory Visit (INDEPENDENT_AMBULATORY_CARE_PROVIDER_SITE_OTHER): Payer: Medicare Other | Admitting: Endocrinology

## 2012-10-02 ENCOUNTER — Encounter: Payer: Self-pay | Admitting: Endocrinology

## 2012-10-02 VITALS — BP 144/72 | HR 86 | Temp 98.2°F | Resp 12 | Ht 63.75 in | Wt 154.3 lb

## 2012-10-02 DIAGNOSIS — E785 Hyperlipidemia, unspecified: Secondary | ICD-10-CM

## 2012-10-02 DIAGNOSIS — I10 Essential (primary) hypertension: Secondary | ICD-10-CM

## 2012-10-02 DIAGNOSIS — E119 Type 2 diabetes mellitus without complications: Secondary | ICD-10-CM

## 2012-10-02 MED ORDER — LISINOPRIL 5 MG PO TABS: 5.0000 mg | ORAL_TABLET | Freq: Every day | ORAL | Status: DC

## 2012-10-02 NOTE — Progress Notes (Addendum)
Patient ID: Julie Brewer, female   DOB: September 03, 1950, 62 y.o.   MRN: [id number]  Julie Brewer is an 62 y.o. female.   Reason for Appointment: Diabetes follow-up   History of Present Illness   Diagnosis: Type 2 DIABETES MELITUS, date of diagnosis:  1985  She has been on insulin since diagnosis and on Lantus previously Also at some point had been started on Glucophage several years ago and is still taking this Because of insurance preference this was changed to Levemir Also she did not want to use analog rapid acting insulin because of cost and is using regular insulin for several years   Her blood sugars are generally well controlled and A1c usually under 7%  For some reason she has started having fairly low blood sugars in the mornings and occasionally in the afternoons a couple of weeks ago and did not report this These are usually before her first meal at 10 AM and occasionally at 4 PM  Has been fairly compliant with her insulin all along. She does take 2 units more before eating cereal in the morning of the regular       Oral hypoglycemic drugs: Metformin ER        Side effects from medications: None Insulin regimen: Levemir insulin 56 in the morning and 26 in the evening. Regular insulin 7-9 units a.m. and 15 before supper           Proper timing of medications in relation to meals: Yes.         Monitors blood glucose:  at least 2 times a day.    Glucometer:  FreeStyle     Blood Glucose readings from meter download: readings before breakfast: 35-100, around 3-4 PM recently up to 126, lowest reading 50 and highest 231 6 PM = 52, 7-8 PM = 89-219 and overall average 110  Hypoglycemia frequency:  almost daily around 10 AM and occasionally at 4-6 PM.          Meals:  usually 2 meals per day at 10 AM and 5 PM.          Physical activity: exercise: Just walking within the house              The last HbgA1c was reported as 6.9  URINE microalbumin has been persistently high,  last level 202 in 5/14  Wt Readings from Last 3 Encounters:  10/02/12 154 lb 4.8 oz (69.99 kg)  09/01/12 150 lb (68.04 kg)  08/20/11 151 lb 4.8 oz (68.629 kg)    Appointment on 09/30/2012  Component Date Value Range Status  . Cholesterol 09/30/2012 129  0 - 200 mg/dL Final   ATP III Classification       Desirable:  < 200 mg/dL               Borderline High:  200 - 239 mg/dL          High:  > = 240 mg/dL  . Triglycerides 09/30/2012 94.0  0.0 - 149.0 mg/dL Final   Normal:  <150 mg/dLBorderline High:  150 - 199 mg/dL  . HDL 09/30/2012 58.00  >39.00 mg/dL Final  . VLDL 09/30/2012 18.8  0.0 - 40.0 mg/dL Final  . LDL Cholesterol 09/30/2012 52  0 - 99 mg/dL Final  . Total CHOL/HDL Ratio 09/30/2012 2   Final                  Men  Women1/2 Average Risk     3.4          3.3Average Risk          5.0          4.42X Average Risk          9.6          7.13X Average Risk          15.0          11.0                      . Hemoglobin A1C 09/30/2012 6.3  4.6 - 6.5 % Final   Glycemic Control Guidelines for People with Diabetes:Non Diabetic:  <6%Goal of Therapy: <7%Additional Action Suggested:  >8%   . Microalb, Ur 09/30/2012 76.2* 0.0 - 1.9 mg/dL Final  . Creatinine,U 09/30/2012 83.9   Final  . Microalb Creat Ratio 09/30/2012 90.8* 0.0 - 30.0 mg/g Final  . Sodium 09/30/2012 135  135 - 145 mEq/L Final  . Potassium 09/30/2012 3.6  3.5 - 5.1 mEq/L Final  . Chloride 09/30/2012 103  96 - 112 mEq/L Final  . CO2 09/30/2012 26  19 - 32 mEq/L Final  . Glucose, Bld 09/30/2012 89  70 - 99 mg/dL Final  . BUN 09/30/2012 11  6 - 23 mg/dL Final  . Creatinine, Ser 09/30/2012 1.0  0.4 - 1.2 mg/dL Final  . Total Bilirubin 09/30/2012 0.2* 0.3 - 1.2 mg/dL Final  . Alkaline Phosphatase 09/30/2012 66  39 - 117 U/L Final  . AST 09/30/2012 14  0 - 37 U/L Final  . ALT 09/30/2012 15  0 - 35 U/L Final  . Total Protein 09/30/2012 6.4  6.0 - 8.3 g/dL Final  . Albumin 09/30/2012 3.3* 3.5 - 5.2 g/dL Final  . Calcium  09/30/2012 9.3  8.4 - 10.5 mg/dL Final  . GFR 09/30/2012 71.91  >60.00 mL/min Final  . Color, Urine 09/30/2012 LT. YELLOW  Yellow;Lt. Yellow Final  . APPearance 09/30/2012 Sl Cloudy  Clear Final  . Specific Gravity, Urine 09/30/2012 1.020  1.000-1.030 Final  . pH 09/30/2012 6.5  5.0 - 8.0 Final  . Total Protein, Urine 09/30/2012 100  Negative Final  . Urine Glucose 09/30/2012 NEGATIVE  Negative Final  . Ketones, ur 09/30/2012 NEGATIVE  Negative Final  . Bilirubin Urine 09/30/2012 NEGATIVE  Negative Final  . Hgb urine dipstick 09/30/2012 NEGATIVE  Negative Final  . Urobilinogen, UA 09/30/2012 0.2  0.0 - 1.0 Final  . Leukocytes, UA 09/30/2012 NEGATIVE  Negative Final  . Nitrite 09/30/2012 POSITIVE  Negative Final  . WBC, UA 09/30/2012 3-6/hpf  0-2/hpf Final  . Bacteria, UA 09/30/2012 Many(>50/hpf)  None Final      Medication List       This list is accurate as of: 10/02/12 10:53 AM.  Always use your most recent med list.               aspirin 325 MG EC tablet  Take 325 mg by mouth daily.     cilostazol 100 MG tablet  Commonly known as:  PLETAL  Take 1 tablet (100 mg total) by mouth 2 (two) times daily.     cloNIDine 0.1 MG tablet  Commonly known as:  CATAPRES  Take 1 tablet (0.1 mg total) by mouth 2 (two) times daily.     gabapentin 100 MG capsule  Commonly known as:  NEURONTIN  Take 100 mg by mouth 2 (two) times daily.  insulin detemir 100 UNIT/ML injection  Commonly known as:  LEVEMIR  Inject 26-56 Units into the skin 2 (two) times daily with a meal. As directed     insulin regular 100 units/mL injection  Commonly known as:  NOVOLIN R,HUMULIN R  Inject 7-15 Units into the skin 2 (two) times daily before a meal.     lisinopril 5 MG tablet  Commonly known as:  PRINIVIL,ZESTRIL  Take 1 tablet (5 mg total) by mouth daily.     metFORMIN 750 MG 24 hr tablet  Commonly known as:  GLUCOPHAGE-XR  Take 750 mg by mouth daily.     metoCLOPramide 10 MG tablet  Commonly  known as:  REGLAN  Take 10 mg by mouth 4 (four) times daily.     omeprazole 20 MG capsule  Commonly known as:  PRILOSEC  Take 20 mg by mouth daily.     simvastatin 40 MG tablet  Commonly known as:  ZOCOR  Take 1 tablet (40 mg total) by mouth every evening.        Allergies:  Allergies  Allergen Reactions  . Morphine And Related Other (See Comments)    Hallucenations   . Penicillins Rash    Swelling    Past Medical History  Diagnosis Date  . Hypertension   . Hyperlipidemia   . Diabetes mellitus   . Peripheral vascular disease   . Stroke     Past Surgical History  Procedure Laterality Date  . Refractive surgery      both eyes  . Back surgery      X2  . Abdominal hysterectomy    . Colonoscopy  July 09, 2012    Family History  Problem Relation Age of Onset  . Cancer Mother   . Heart attack Mother   . Diabetes Father   . Cancer Sister     Social History:  reports that she has been smoking Cigarettes.  She has been smoking about 1.00 pack per day. She has never used smokeless tobacco. She reports that she does not drink alcohol or use illicit drugs.  Review of Systems:  HYPERTENSION:  now taking clonidine and lisinopril low dose with fairly good control  HYPERLIPIDEMIA: The lipid abnormality consists of elevated LDL controlled with simvastatin. Apparently baseline LDL was 202 Lab Results  Component Value Date   LDLCALC 52 09/30/2012    Complaining of her legs burning and stinging, Hurts to walk     Examination:   BP 144/72  Pulse 86  Temp(Src) 98.2 F (36.8 C)  Resp 12  Ht 5' 3.75" (1.619 m)  Wt 154 lb 4.8 oz (69.99 kg)  BMI 26.7 kg/m2  SpO2 99%  Body mass index is 26.7 kg/(m^2).   No edema and feet are normal to inspection  ASSESSMENT/ PLAN::   Diabetes type 2   The patient's diabetes control appears to be overall good but she is having frequent hypoglycemia before breakfast and occasionally in the afternoon. 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

## 2012-10-02 NOTE — Patient Instructions (Addendum)
Reduce pm LEVEMIR TO 20 and change up or down weekly by 2 units if sugar is > 140 or stays <80 Call if having excessive hypoglycemia

## 2012-10-06 DIAGNOSIS — I10 Essential (primary) hypertension: Secondary | ICD-10-CM | POA: Insufficient documentation

## 2012-11-27 ENCOUNTER — Telehealth: Payer: Self-pay | Admitting: *Deleted

## 2012-11-27 ENCOUNTER — Telehealth: Payer: Self-pay | Admitting: Endocrinology

## 2012-11-27 NOTE — Telephone Encounter (Signed)
Yes, needs to take this 5-10 minutes before eating, can try the same dose for now but will need to check more readings after meals

## 2012-11-27 NOTE — Telephone Encounter (Signed)
Noted, patient is aware. 

## 2012-11-27 NOTE — Telephone Encounter (Signed)
Left message with patients caregiver to return call

## 2012-11-27 NOTE — Telephone Encounter (Signed)
Patient wants to know if she can take the Novolog instead of the relion?  She has a friend who has some extra who will give it to her. Please advise

## 2012-12-03 ENCOUNTER — Other Ambulatory Visit: Payer: Self-pay | Admitting: Endocrinology

## 2012-12-29 ENCOUNTER — Other Ambulatory Visit (INDEPENDENT_AMBULATORY_CARE_PROVIDER_SITE_OTHER): Payer: Medicare Other

## 2012-12-29 DIAGNOSIS — E119 Type 2 diabetes mellitus without complications: Secondary | ICD-10-CM

## 2012-12-29 LAB — COMPREHENSIVE METABOLIC PANEL
ALT: 13 U/L (ref 0–35)
AST: 14 U/L (ref 0–37)
Albumin: 3.3 g/dL — ABNORMAL LOW (ref 3.5–5.2)
Alkaline Phosphatase: 62 U/L (ref 39–117)
BUN: 14 mg/dL (ref 6–23)
Calcium: 9.4 mg/dL (ref 8.4–10.5)
Chloride: 105 mEq/L (ref 96–112)
Potassium: 4.1 mEq/L (ref 3.5–5.1)
Sodium: 137 mEq/L (ref 135–145)

## 2012-12-29 LAB — HEMOGLOBIN A1C: Hgb A1c MFr Bld: 7.2 % — ABNORMAL HIGH (ref 4.6–6.5)

## 2013-01-01 ENCOUNTER — Other Ambulatory Visit: Payer: Self-pay | Admitting: *Deleted

## 2013-01-01 ENCOUNTER — Ambulatory Visit (INDEPENDENT_AMBULATORY_CARE_PROVIDER_SITE_OTHER): Payer: Medicare Other | Admitting: Endocrinology

## 2013-01-01 ENCOUNTER — Encounter: Payer: Self-pay | Admitting: Endocrinology

## 2013-01-01 VITALS — BP 138/68 | HR 95 | Temp 98.4°F | Resp 12 | Ht 63.75 in | Wt 155.7 lb

## 2013-01-01 DIAGNOSIS — E119 Type 2 diabetes mellitus without complications: Secondary | ICD-10-CM

## 2013-01-01 DIAGNOSIS — I10 Essential (primary) hypertension: Secondary | ICD-10-CM

## 2013-01-01 DIAGNOSIS — E785 Hyperlipidemia, unspecified: Secondary | ICD-10-CM

## 2013-01-01 MED ORDER — OMEPRAZOLE 20 MG PO CPDR: 20.0000 mg | DELAYED_RELEASE_CAPSULE | Freq: Every day | ORAL | Status: DC

## 2013-01-01 MED ORDER — SIMVASTATIN 40 MG PO TABS: ORAL_TABLET | ORAL | Status: DC

## 2013-01-01 MED ORDER — METFORMIN HCL ER 750 MG PO TB24: 750.0000 mg | ORAL_TABLET | Freq: Every day | ORAL | Status: DC

## 2013-01-01 MED ORDER — GABAPENTIN 100 MG PO CAPS: 100.0000 mg | ORAL_CAPSULE | Freq: Two times a day (BID) | ORAL | Status: DC

## 2013-01-01 MED ORDER — VITAMIN D (ERGOCALCIFEROL) 1.25 MG (50000 UNIT) PO CAPS: 50000.0000 [IU] | ORAL_CAPSULE | ORAL | Status: DC

## 2013-01-01 MED ORDER — CLONIDINE HCL 0.1 MG PO TABS: ORAL_TABLET | ORAL | Status: DC

## 2013-01-01 NOTE — Patient Instructions (Signed)
Take 18 Novolin when eating out

## 2013-01-01 NOTE — Progress Notes (Signed)
Patient ID: Julie Brewer, female   DOB: 12-Nov-1950, 62 y.o.   MRN: [id number]  Julie Brewer is an 62 y.o. female.   Reason for Appointment: Diabetes follow-up   History of Present Illness   Diagnosis: Type 2 DIABETES MELITUS, date of diagnosis:  1985  She has been on insulin since diagnosis and on Lantus previously Also at some point had been started on Glucophage several years ago also Because of insurance preference Lantus was changed to Levemir and is using this twice a day She refuses to use analog rapid acting insulin because of cost and is using regular insulin for several years   Her blood sugars are generally well controlled and A1c usually under 7%  Recent history: Fasting blood sugars are not as low since taking Levemir was reduced on the last visit Although checking mostly before breakfast and supper she has occasional high readings after supper which is when she is not eating her own home cooked food. Does not take extra regular insulin for this Has been fairly compliant with her insulin. She does take 2 units more of regular insulin before eating cereal in the morning      Oral hypoglycemic drugs: Metformin ER        Side effects from medications: None Insulin regimen: Levemir insulin 56 in the morning and 20 in the evening. Regular insulin 7-9 units a.m. and 15 before supper           Proper timing of medications in relation to meals: Yes, 30 min ac.         Monitors blood glucose: About 2 times a day.    Glucometer:  FreeStyle     Blood Glucose readings from meter download:  PREMEAL Breakfast Lunch Dinner Bedtime Overall  Glucose range:  104-231  ?   69-142     Mean/median:  136    110    134    POST-MEAL PC Breakfast PC Lunch PC Dinner  Glucose range:    136-243   Mean/median:    170     Hypoglycemia: Minimal recently, lowest reading 69 at suppertime         Meals:  usually 2 meals per day at 10 AM and 5 PM.          Physical activity: exercise: Just  walking within the house             URINE microalbumin has been persistently high, her level was 202 in 5/14  Wt Readings from Last 3 Encounters:  01/01/13 155 lb 11.2 oz (70.625 kg)  10/02/12 154 lb 4.8 oz (69.99 kg)  09/01/12 150 lb (68.04 kg)   Lab Results  Component Value Date   HGBA1C 7.2* 12/29/2012   HGBA1C 6.3 09/30/2012   Lab Results  Component Value Date   MICROALBUR 76.2* 09/30/2012   LDLCALC 52 09/30/2012   CREATININE 1.0 12/29/2012     Appointment on 12/29/2012  Component Date Value Range Status  . Hemoglobin A1C 12/29/2012 7.2* 4.6 - 6.5 % Final   Glycemic Control Guidelines for People with Diabetes:Non Diabetic:  <6%Goal of Therapy: <7%Additional Action Suggested:  >8%   . Sodium 12/29/2012 137  135 - 145 mEq/L Final  . Potassium 12/29/2012 4.1  3.5 - 5.1 mEq/L Final  . Chloride 12/29/2012 105  96 - 112 mEq/L Final  . CO2 12/29/2012 24  19 - 32 mEq/L Final  . Glucose, Bld 12/29/2012 148* 70 - 99 mg/dL Final  . BUN  12/29/2012 14  6 - 23 mg/dL Final  . Creatinine, Ser 12/29/2012 1.0  0.4 - 1.2 mg/dL Final  . Total Bilirubin 12/29/2012 0.2* 0.3 - 1.2 mg/dL Final  . Alkaline Phosphatase 12/29/2012 62  39 - 117 U/L Final  . AST 12/29/2012 14  0 - 37 U/L Final  . ALT 12/29/2012 13  0 - 35 U/L Final  . Total Protein 12/29/2012 6.6  6.0 - 8.3 g/dL Final  . Albumin 12/29/2012 3.3* 3.5 - 5.2 g/dL Final  . Calcium 12/29/2012 9.4  8.4 - 10.5 mg/dL Final  . GFR 12/29/2012 69.46  >60.00 mL/min Final      Medication List       This list is accurate as of: 01/01/13 10:40 AM.  Always use your most recent med list.               aspirin 325 MG EC tablet  Take 325 mg by mouth daily.     cilostazol 100 MG tablet  Commonly known as:  PLETAL  Take 1 tablet (100 mg total) by mouth 2 (two) times daily.     cloNIDine 0.1 MG tablet  Commonly known as:  CATAPRES  TAKE ONE TABLET BY MOUTH TWICE DAILY     gabapentin 100 MG capsule  Commonly known as:  NEURONTIN  Take 1  capsule (100 mg total) by mouth 2 (two) times daily.     insulin detemir 100 UNIT/ML injection  Commonly known as:  LEVEMIR  Inject 26-56 Units into the skin 2 (two) times daily with a meal. 56 in am and 20 in p,m     insulin regular 100 units/mL injection  Commonly known as:  NOVOLIN R,HUMULIN R  Inject 7-15 Units into the skin 2 (two) times daily before a meal. 7 -9 in am and 15 at supper     lisinopril 5 MG tablet  Commonly known as:  PRINIVIL,ZESTRIL  Take 1 tablet (5 mg total) by mouth daily.     metFORMIN 750 MG 24 hr tablet  Commonly known as:  GLUCOPHAGE-XR  Take 1 tablet (750 mg total) by mouth daily.     metoCLOPramide 10 MG tablet  Commonly known as:  REGLAN  Take 10 mg by mouth 4 (four) times daily.     omeprazole 20 MG capsule  Commonly known as:  PRILOSEC  Take 1 capsule (20 mg total) by mouth daily.     simvastatin 40 MG tablet  Commonly known as:  ZOCOR  TAKE ONE TABLET BY MOUTH ONCE DAILY EVERY EVENING     Vitamin D (Ergocalciferol) 50000 UNITS Caps capsule  Commonly known as:  DRISDOL  Take 1 capsule (50,000 Units total) by mouth every 7 (seven) days.        Allergies:  Allergies  Allergen Reactions  . Morphine And Related Other (See Comments)    Hallucenations   . Penicillins Rash    Swelling    Past Medical History  Diagnosis Date  . Hypertension   . Hyperlipidemia   . Diabetes mellitus   . Peripheral vascular disease   . Stroke     Past Surgical History  Procedure Laterality Date  . Refractive surgery      both eyes  . Back surgery      X2  . Abdominal hysterectomy    . Colonoscopy  July 09, 2012    Family History  Problem Relation Age of Onset  . Cancer Mother   . Heart attack Mother   .  Diabetes Father   . Cancer Sister     Social History:  reports that she has been smoking Cigarettes.  She has been smoking about 1.00 pack per day. She has never used smokeless tobacco. She reports that she does not drink alcohol or use  illicit drugs.  Review of Systems:  HYPERTENSION:  now taking clonidine and lisinopril low dose with  good control, not monitoring at home  HYPERLIPIDEMIA: The lipid abnormality consists of elevated LDL controlled with simvastatin. Apparently baseline LDL was 202 Lab Results  Component Value Date   LDLCALC 52 09/30/2012    Complaining of her legs burning and stinging, feet Hurt to walk, this is much better with using gabapentin 2 times daily     Examination:   BP 138/68  Pulse 95  Temp(Src) 98.4 F (36.9 C)  Resp 12  Ht 5' 3.75" (1.619 m)  Wt 155 lb 11.2 oz (70.625 kg)  BMI 26.94 kg/m2  SpO2 96%  Body mass index is 26.94 kg/(m^2).   No pedal edema  ASSESSMENT/ PLAN::   Diabetes type 2   The patient's diabetes control appears to be overall fairly good as judged by her home readings and A1c A1c is relatively higher probably because of less hypoglycemia compared to the last visit  Most likely he is having postprandial hyperglycemia periodically especially when eating out in the evening. Sometimes her blood sugars have higher overnight also from not eating controlled meals at home.  She will increase her suppertime dose to 18 units when she is eating more than usual She can continue to take Humulin regular insulin 30 minutes before her meals and adjust based on carbohydrate intake No change in Levemir She will also continue metformin as well as renal function is normal  NEPHROPATHY: She has mild microalbuminuria with normal renal function Neuropathy: Now better controlled with low-dose gabapentin  Hyperlipidemia: Excellent control with simvastatin   Julie Brewer 01/01/2013, 10:40 AM

## 2013-02-04 ENCOUNTER — Other Ambulatory Visit: Payer: Self-pay | Admitting: *Deleted

## 2013-02-04 MED ORDER — INSULIN DETEMIR 100 UNIT/ML ~~LOC~~ SOLN: SUBCUTANEOUS | Status: DC

## 2013-02-05 ENCOUNTER — Other Ambulatory Visit: Payer: Self-pay | Admitting: *Deleted

## 2013-02-05 MED ORDER — INSULIN DETEMIR 100 UNIT/ML ~~LOC~~ SOLN: SUBCUTANEOUS | Status: DC

## 2013-03-11 ENCOUNTER — Ambulatory Visit: Payer: Self-pay | Admitting: Podiatry

## 2013-03-23 ENCOUNTER — Other Ambulatory Visit: Payer: Self-pay | Admitting: *Deleted

## 2013-03-23 MED ORDER — GLUCOSE BLOOD VI STRP: ORAL_STRIP | Status: DC

## 2013-03-27 ENCOUNTER — Other Ambulatory Visit: Payer: Medicare Other

## 2013-03-28 ENCOUNTER — Other Ambulatory Visit: Payer: Self-pay | Admitting: Endocrinology

## 2013-03-30 ENCOUNTER — Other Ambulatory Visit (INDEPENDENT_AMBULATORY_CARE_PROVIDER_SITE_OTHER): Payer: Medicare HMO

## 2013-03-30 DIAGNOSIS — E119 Type 2 diabetes mellitus without complications: Secondary | ICD-10-CM

## 2013-03-30 LAB — BASIC METABOLIC PANEL
BUN: 19 mg/dL (ref 6–23)
CHLORIDE: 104 meq/L (ref 96–112)
CO2: 26 mEq/L (ref 19–32)
CREATININE: 1.2 mg/dL (ref 0.4–1.2)
Calcium: 9.6 mg/dL (ref 8.4–10.5)
GFR: 58.84 mL/min — ABNORMAL LOW (ref >60.00)
GLUCOSE: 99 mg/dL (ref 70–99)
POTASSIUM: 4.2 meq/L (ref 3.5–5.1)
Sodium: 137 mEq/L (ref 135–145)

## 2013-03-30 LAB — HEMOGLOBIN A1C: Hgb A1c MFr Bld: 7.4 % — ABNORMAL HIGH (ref 4.6–6.5)

## 2013-03-30 LAB — MICROALBUMIN / CREATININE URINE RATIO
Creatinine,U: 80.2 mg/dL
MICROALB UR: 25.5 mg/dL — AB (ref 0.0–1.9)
Microalb Creat Ratio: 31.8 mg/g — ABNORMAL HIGH (ref 0.0–30.0)

## 2013-04-01 ENCOUNTER — Ambulatory Visit (INDEPENDENT_AMBULATORY_CARE_PROVIDER_SITE_OTHER): Payer: Medicare HMO | Admitting: Endocrinology

## 2013-04-01 ENCOUNTER — Encounter: Payer: Self-pay | Admitting: Endocrinology

## 2013-04-01 VITALS — BP 128/60 | HR 96 | Temp 98.1°F | Resp 16 | Ht 63.75 in | Wt 151.8 lb

## 2013-04-01 DIAGNOSIS — E1149 Type 2 diabetes mellitus with other diabetic neurological complication: Secondary | ICD-10-CM

## 2013-04-01 DIAGNOSIS — I739 Peripheral vascular disease, unspecified: Secondary | ICD-10-CM

## 2013-04-01 DIAGNOSIS — E1165 Type 2 diabetes mellitus with hyperglycemia: Secondary | ICD-10-CM

## 2013-04-01 DIAGNOSIS — E1140 Type 2 diabetes mellitus with diabetic neuropathy, unspecified: Secondary | ICD-10-CM

## 2013-04-01 DIAGNOSIS — E1129 Type 2 diabetes mellitus with other diabetic kidney complication: Secondary | ICD-10-CM

## 2013-04-01 DIAGNOSIS — E1142 Type 2 diabetes mellitus with diabetic polyneuropathy: Secondary | ICD-10-CM

## 2013-04-01 NOTE — Patient Instructions (Signed)
More readings 2-3hrs after meals  Walk as much as you can  Eye exam annually

## 2013-04-01 NOTE — Progress Notes (Signed)
Patient ID: Julie Brewer, female   DOB: [birth year], 63 y.o.   MRN: [id number]   Reason for Appointment: Diabetes follow-up   History of Present Illness   Diagnosis: Type 2 DIABETES MELITUS, date of diagnosis:  1985  Prior history: She has been on insulin since diagnosis and on Lantus previously Also at some point had been started on Glucophage several years ago also Because of insurance preference Lantus was changed to Levemir and is using this twice a day She refuses to use analog rapid acting insulin because of cost and is using regular insulin for several years   Her blood sugars are generally well controlled and A1c usually under 7%  Recent history:  Although her A1c is slightly higher than usual she appears to have fairly good readings at home She continues to take regular insulins instead of analog brands because of cost, usually taking 30 minutes before eating As directed she will take 18 units before supper if eating out Only once she had a high reading around supper time but otherwise her blood sugars are fairly good Does not check after meals very often Fasting blood sugars are fairly close to normal usually Also no recent hypoglycemia, lowest reading 63 after supper Has been fairly compliant with her insulin. She does take 2 units more of regular insulin before eating cereal in the morning  Insulin regimen: Levemir insulin 56 in the morning and 20 in the evening. Regular insulin 7-9 units a.m. and 15 before supper            Oral hypoglycemic drugs: Metformin ER        Side effects from medications: None Proper timing of medications in relation to meals: Yes, 30 min ac.         Monitors blood glucose: About 2 times a day.    Glucometer:  FreeStyle     Blood Glucose readings from meter download:  PREMEAL Breakfast Lunch Dinner Bedtime Overall  Glucose range:  72-110  ?   70-217     Mean/median:  101    130    117    POST-MEAL PC Breakfast PC Lunch PC Dinner   Glucose range:  107-153    63-180   Mean/median:        Hypoglycemia: Minimal recently, lowest reading 63    Meals:  usually 2 meals per day at 10 AM and 5 PM.          Physical activity: exercise: Just walking within the house             Microalbumin has been persistently high, her level was 202 in 5/14 and now improved  Wt Readings from Last 3 Encounters:  04/01/13 151 lb 12.8 oz (68.856 kg)  01/01/13 155 lb 11.2 oz (70.625 kg)  10/02/12 154 lb 4.8 oz (69.99 kg)   Lab Results  Component Value Date   HGBA1C 7.4* 03/30/2013   HGBA1C 7.2* 12/29/2012   HGBA1C 6.3 09/30/2012   Lab Results  Component Value Date   MICROALBUR 25.5* 03/30/2013   LDLCALC 52 09/30/2012   CREATININE 1.2 03/30/2013     Appointment on 03/30/2013  Component Date Value Ref Range Status  . Hemoglobin A1C 03/30/2013 7.4* 4.6 - 6.5 % Final   Glycemic Control Guidelines for People with Diabetes:Non Diabetic:  <6%Goal of Therapy: <7%Additional Action Suggested:  >8%   . Sodium 03/30/2013 137  135 - 145 mEq/L Final  . Potassium 03/30/2013 4.2  3.5 - 5.1 mEq/L Final  .  Chloride 03/30/2013 104  96 - 112 mEq/L Final  . CO2 03/30/2013 26  19 - 32 mEq/L Final  . Glucose, Bld 03/30/2013 99  70 - 99 mg/dL Final  . BUN 03/30/2013 19  6 - 23 mg/dL Final  . Creatinine, Ser 03/30/2013 1.2  0.4 - 1.2 mg/dL Final  . Calcium 03/30/2013 9.6  8.4 - 10.5 mg/dL Final  . GFR 03/30/2013 58.84* >60.00 mL/min Final  . Microalb, Ur 03/30/2013 25.5* 0.0 - 1.9 mg/dL Final  . Creatinine,U 03/30/2013 80.2   Final  . Microalb Creat Ratio 03/30/2013 31.8* 0.0 - 30.0 mg/g Final      Medication List       This list is accurate as of: 04/01/13 10:12 AM.  Always use your most recent med list.               aspirin 325 MG EC tablet  Take 325 mg by mouth daily.     cilostazol 100 MG tablet  Commonly known as:  PLETAL  Take 1 tablet (100 mg total) by mouth 2 (two) times daily.     cloNIDine 0.1 MG tablet  Commonly known as:  CATAPRES   TAKE ONE TABLET BY MOUTH TWICE DAILY     gabapentin 100 MG capsule  Commonly known as:  NEURONTIN  Take 1 capsule (100 mg total) by mouth 2 (two) times daily.     glucose blood test strip  Commonly known as:  FREESTYLE LITE  Use as instructed to check blood sugar 4 times a day dx code 250.00     insulin detemir 100 UNIT/ML injection  Commonly known as:  LEVEMIR  Inject 58 units in am and 20 in pm dx code 250.00     insulin regular 100 units/mL injection  Commonly known as:  NOVOLIN R,HUMULIN R  Inject 7-15 Units into the skin 2 (two) times daily before a meal. 7 -9 in am and 15 at supper     lisinopril 5 MG tablet  Commonly known as:  PRINIVIL,ZESTRIL  TAKE ONE TABLET BY MOUTH ONCE DAILY     metFORMIN 750 MG 24 hr tablet  Commonly known as:  GLUCOPHAGE-XR  Take 1 tablet (750 mg total) by mouth daily.     metoCLOPramide 10 MG tablet  Commonly known as:  REGLAN  Take 10 mg by mouth 4 (four) times daily.     omeprazole 20 MG capsule  Commonly known as:  PRILOSEC  Take 1 capsule (20 mg total) by mouth daily.     simvastatin 40 MG tablet  Commonly known as:  ZOCOR  TAKE ONE TABLET BY MOUTH ONCE DAILY EVERY EVENING     Vitamin D (Ergocalciferol) 50000 UNITS Caps capsule  Commonly known as:  DRISDOL  Take 1 capsule (50,000 Units total) by mouth every 7 (seven) days.        Allergies:  Allergies  Allergen Reactions  . Morphine And Related Other (See Comments)    Hallucenations   . Penicillins Rash    Swelling    Past Medical History  Diagnosis Date  . Hypertension   . Hyperlipidemia   . Diabetes mellitus   . Peripheral vascular disease   . Stroke     Past Surgical History  Procedure Laterality Date  . Refractive surgery      both eyes  . Back surgery      X2  . Abdominal hysterectomy    . Colonoscopy  July 09, 2012    Family History  Problem Relation Age of Onset  . Cancer Mother   . Heart attack Mother   . Diabetes Father   . Cancer Sister      Social History:  reports that she has been smoking Cigarettes.  She has been smoking about 1.00 pack per day. She has never used smokeless tobacco. She reports that she does not drink alcohol or use illicit drugs.  Review of Systems:  HYPERTENSION:  now taking clonidine and lisinopril low dose with  good control, not monitoring at home  HYPERLIPIDEMIA: The lipid abnormality consists of elevated LDL controlled with simvastatin. Apparently baseline LDL was 202  Lab Results  Component Value Date   CHOL 129 09/30/2012   HDL 58.00 09/30/2012   LDLCALC 52 09/30/2012   TRIG 94.0 09/30/2012   CHOLHDL 2 09/30/2012    Complaining of her legs burning and stinging, some pain on walking in her feet; feels much better with using gabapentin 2 times daily  No pain in Muscles on walking     Examination:   BP 128/60  Pulse 96  Temp(Src) 98.1 F (36.7 C)  Resp 16  Ht 5' 3.75" (1.619 m)  Wt 151 lb 12.8 oz (68.856 kg)  BMI 26.27 kg/m2  SpO2 96%  Body mass index is 26.27 kg/(m^2).   No pedal edema  ASSESSMENT/ PLAN::   Diabetes type 2   The patient's diabetes control appears to be overall fairly good as judged by her home readings  A1c is still relatively high and not clear why; her average blood sugar at home is 117 although only a few readings are done after meals. She does take 3 units more at dinnertime when she is eating out No hypoglycemia either.  No change in her insulin  doses for now She will also continue metformin as well since renal function is normal  NEPHROPATHY: She has mild microalbuminuria with normal renal function Neuropathy: Symptoms  controlled with low-dose gabapentin  Hyperlipidemia: Excellent control with simvastatin   Serine Kea 04/01/2013, 10:12 AM

## 2013-04-07 ENCOUNTER — Other Ambulatory Visit: Payer: Self-pay

## 2013-04-07 DIAGNOSIS — Z1231 Encounter for screening mammogram for malignant neoplasm of breast: Secondary | ICD-10-CM

## 2013-04-29 ENCOUNTER — Ambulatory Visit: Payer: Medicare Other

## 2013-05-13 ENCOUNTER — Ambulatory Visit (INDEPENDENT_AMBULATORY_CARE_PROVIDER_SITE_OTHER): Payer: Medicare HMO | Admitting: Podiatry

## 2013-05-13 ENCOUNTER — Encounter: Payer: Self-pay | Admitting: Podiatry

## 2013-05-13 VITALS — BP 160/77 | HR 94 | Resp 17 | Ht 63.25 in | Wt 151.0 lb

## 2013-05-13 DIAGNOSIS — Q828 Other specified congenital malformations of skin: Secondary | ICD-10-CM

## 2013-05-13 DIAGNOSIS — B351 Tinea unguium: Secondary | ICD-10-CM

## 2013-05-13 DIAGNOSIS — M79609 Pain in unspecified limb: Secondary | ICD-10-CM

## 2013-05-13 DIAGNOSIS — E1059 Type 1 diabetes mellitus with other circulatory complications: Secondary | ICD-10-CM

## 2013-05-13 NOTE — Patient Instructions (Signed)
Diabetes and Foot Care Diabetes may cause you to have problems because of poor blood supply (circulation) to your feet and legs. This may cause the skin on your feet to become thinner, break easier, and heal more slowly. Your skin may become dry, and the skin may peel and crack. You may also have nerve damage in your legs and feet causing decreased feeling in them. You may not notice minor injuries to your feet that could lead to infections or more serious problems. Taking care of your feet is one of the most important things you can do for yourself.  HOME CARE INSTRUCTIONS  Wear shoes at all times, even in the house. Do not go barefoot. Bare feet are easily injured.  Check your feet daily for blisters, cuts, and redness. If you cannot see the bottom of your feet, use a mirror or ask someone for help.  Wash your feet with warm water (do not use hot water) and mild soap. Then pat your feet and the areas between your toes until they are completely dry. Do not soak your feet as this can dry your skin.  Apply a moisturizing lotion or petroleum jelly (that does not contain alcohol and is unscented) to the skin on your feet and to dry, brittle toenails. Do not apply lotion between your toes.  Trim your toenails straight across. Do not dig under them or around the cuticle. File the edges of your nails with an emery board or nail file.  Do not cut corns or calluses or try to remove them with medicine.  Wear clean socks or stockings every day. Make sure they are not too tight. Do not wear knee-high stockings since they may decrease blood flow to your legs.  Wear shoes that fit properly and have enough cushioning. To break in new shoes, wear them for just a few hours a day. This prevents you from injuring your feet. Always look in your shoes before you put them on to be sure there are no objects inside.  Do not cross your legs. This may decrease the blood flow to your feet.  If you find a minor scrape,  cut, or break in the skin on your feet, keep it and the skin around it clean and dry. These areas may be cleansed with mild soap and water. Do not cleanse the area with peroxide, alcohol, or iodine.  When you remove an adhesive bandage, be sure not to damage the skin around it.  If you have a wound, look at it several times a day to make sure it is healing.  Do not use heating pads or hot water bottles. They may burn your skin. If you have lost feeling in your feet or legs, you may not know it is happening until it is too late.  Make sure your health care provider performs a complete foot exam at least annually or more often if you have foot problems. Report any cuts, sores, or bruises to your health care provider immediately. SEEK MEDICAL CARE IF:   You have an injury that is not healing.  You have cuts or breaks in the skin.  You have an ingrown nail.  You notice redness on your legs or feet.  You feel burning or tingling in your legs or feet.  You have pain or cramps in your legs and feet.  Your legs or feet are numb.  Your feet always feel cold. SEEK IMMEDIATE MEDICAL CARE IF:   There is increasing redness,   swelling, or pain in or around a wound.  There is a red line that goes up your leg.  Pus is coming from a wound.  You develop a fever or as directed by your health care provider.  You notice a bad smell coming from an ulcer or wound. Document Released: 01/13/2000 Document Revised: 09/17/2012 Document Reviewed: 06/24/2012 [employer] Patient Information 2014 [employer]  

## 2013-05-13 NOTE — Progress Notes (Signed)
   Subjective:    Patient ID: Lin Landsman, female    DOB: July 06, 1950, 63 y.o.   MRN: [id number]  HPI Comments: N Debridement L B/L 1 - 5 toenails, Right plantar 2nd, 76ft MPJ, heel, and Left plantar 2nd MPJ D and O long-term C elongated, encurvated, thick - toenails     Hard painful skin - callous A diabetes, blindness, difficult to trim T Dr Salley Scarlet trimmed areas     Review of Systems  Eyes: Positive for visual disturbance.       Righr eye blindness, left some vision remains  Musculoskeletal: Positive for back pain.  All other systems reviewed and are negative.      Objective:   Physical Exam Orientated x58 63 year old black female  Vascular: DP is are 2/4 bilaterally. Left PT is 0/4 right PT 2/4  Neurological: Sensation to 10 g monofilament wire intact 2/5 bilaterally. Ankle reflexes reactive bilaterally. Vibratory sensation intact bilaterally.  Dermatological: Toenails are elongated, hypertrophic, discolored and tender to palpation x10. Nucleated keratoses noted plantar second and fifth MPJ right, second MPJ left and plantar right heel.  Musculoskeletal: Hammertoes 2-5 bilaterally.        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 Peripheral arterial disease to be followed by a vascular surgeon Information about diabetic footcare provided the patient at discharge  Reappoint x3 months

## 2013-05-14 ENCOUNTER — Encounter: Payer: Self-pay | Admitting: Podiatry

## 2013-05-19 ENCOUNTER — Encounter (INDEPENDENT_AMBULATORY_CARE_PROVIDER_SITE_OTHER): Payer: Self-pay

## 2013-05-19 ENCOUNTER — Ambulatory Visit
Admission: RE | Admit: 2013-05-19 | Discharge: 2013-05-19 | Disposition: A | Discharge status: Home / Self Care | Payer: Medicare HMO | Source: Ambulatory Visit

## 2013-05-19 ENCOUNTER — Other Ambulatory Visit: Payer: Self-pay | Admitting: Family Medicine

## 2013-05-19 DIAGNOSIS — N63 Unspecified lump in unspecified breast: Secondary | ICD-10-CM

## 2013-05-19 DIAGNOSIS — Z1231 Encounter for screening mammogram for malignant neoplasm of breast: Secondary | ICD-10-CM

## 2013-06-29 ENCOUNTER — Other Ambulatory Visit (INDEPENDENT_AMBULATORY_CARE_PROVIDER_SITE_OTHER): Payer: Medicare HMO

## 2013-06-29 ENCOUNTER — Other Ambulatory Visit: Payer: Self-pay | Admitting: Endocrinology

## 2013-06-29 DIAGNOSIS — E1165 Type 2 diabetes mellitus with hyperglycemia: Secondary | ICD-10-CM

## 2013-06-29 DIAGNOSIS — E1129 Type 2 diabetes mellitus with other diabetic kidney complication: Secondary | ICD-10-CM

## 2013-06-29 LAB — COMPREHENSIVE METABOLIC PANEL
ALBUMIN: 3.4 g/dL — AB (ref 3.5–5.2)
ALT: 12 U/L (ref 0–35)
AST: 14 U/L (ref 0–37)
Alkaline Phosphatase: 58 U/L (ref 39–117)
BUN: 16 mg/dL (ref 6–23)
CALCIUM: 9.5 mg/dL (ref 8.4–10.5)
CO2: 23 mEq/L (ref 19–32)
Chloride: 106 mEq/L (ref 96–112)
Creatinine, Ser: 1.2 mg/dL (ref 0.4–1.2)
GFR: 58.79 mL/min — ABNORMAL LOW (ref >60.00)
Glucose, Bld: 87 mg/dL (ref 70–99)
Potassium: 4.5 mEq/L (ref 3.5–5.1)
Sodium: 137 mEq/L (ref 135–145)
TOTAL PROTEIN: 6.6 g/dL (ref 6.0–8.3)
Total Bilirubin: 0.3 mg/dL (ref 0.2–1.2)

## 2013-06-29 LAB — LIPID PANEL
CHOLESTEROL: 131 mg/dL (ref 0–200)
HDL: 52.9 mg/dL (ref >39.00)
LDL CALC: 55 mg/dL (ref 0–99)
TRIGLYCERIDES: 117 mg/dL (ref 0.0–149.0)
Total CHOL/HDL Ratio: 2
VLDL: 23.4 mg/dL (ref 0.0–40.0)

## 2013-06-29 LAB — HEMOGLOBIN A1C: Hgb A1c MFr Bld: 6.5 % (ref 4.6–6.5)

## 2013-07-02 ENCOUNTER — Ambulatory Visit (INDEPENDENT_AMBULATORY_CARE_PROVIDER_SITE_OTHER): Payer: Medicare HMO | Admitting: Endocrinology

## 2013-07-02 ENCOUNTER — Encounter: Payer: Self-pay | Admitting: Endocrinology

## 2013-07-02 VITALS — BP 132/50 | HR 97 | Temp 98.0°F | Resp 16 | Ht 63.75 in | Wt 148.2 lb

## 2013-07-02 DIAGNOSIS — E119 Type 2 diabetes mellitus without complications: Secondary | ICD-10-CM | POA: Insufficient documentation

## 2013-07-02 DIAGNOSIS — IMO0001 Reserved for inherently not codable concepts without codable children: Secondary | ICD-10-CM | POA: Insufficient documentation

## 2013-07-02 DIAGNOSIS — Z794 Long term (current) use of insulin: Secondary | ICD-10-CM

## 2013-07-02 DIAGNOSIS — K3184 Gastroparesis: Secondary | ICD-10-CM

## 2013-07-02 DIAGNOSIS — E118 Type 2 diabetes mellitus with unspecified complications: Secondary | ICD-10-CM

## 2013-07-02 DIAGNOSIS — E559 Vitamin D deficiency, unspecified: Secondary | ICD-10-CM

## 2013-07-02 DIAGNOSIS — I10 Essential (primary) hypertension: Secondary | ICD-10-CM

## 2013-07-02 DIAGNOSIS — E785 Hyperlipidemia, unspecified: Secondary | ICD-10-CM

## 2013-07-02 DIAGNOSIS — E1149 Type 2 diabetes mellitus with other diabetic neurological complication: Secondary | ICD-10-CM

## 2013-07-02 DIAGNOSIS — E1143 Type 2 diabetes mellitus with diabetic autonomic (poly)neuropathy: Secondary | ICD-10-CM

## 2013-07-02 MED ORDER — METOCLOPRAMIDE HCL 10 MG PO TABS: 10.0000 mg | ORAL_TABLET | Freq: Four times a day (QID) | ORAL | Status: DC

## 2013-07-02 NOTE — Progress Notes (Signed)
Patient ID: Julie Brewer, female   DOB: Jul 24, 1950, 63 y.o.   MRN: [id number]   Reason for Appointment: Diabetes follow-up   History of Present Illness   Diagnosis: Type 2 DIABETES MELITUS, date of diagnosis:  1985  Prior history: She has been on insulin since diagnosis and on Lantus previously Also at some point had been started on Glucophage several years ago also Because of insurance preference Lantus was changed to Levemir and is using this twice a day She refuses to use analog rapid acting insulin because of cost and is using regular insulin for several years   Her blood sugars are generally well controlled and A1c usually under 7%  Recent history:  Insulin regimen: Levemir insulin 56 in the morning and 20 in the evening. Regular insulin 7-9 units a.m. and 15 before supper            Her blood sugars are overall much lower and not clear why.  She says her appetite is somewhat variable but most of her low sugars appear to be before her afternoon meal She is checking her blood sugar fairly regularly but mostly before meals and some after supper A1c is usually not proportional to her actual blood sugars and is relatively lower also She is usually getting a protein at her breakfast and mostly eating 2 meals a day Has only occasional low sugars waking up or after supper but most of her low sugars are now before supper She reports no symptoms of hypoglycemia even for blood sugars below 40 Has been fairly compliant with her insulin and takes her mealtime insulin up to 30 minutes before eating as directed. She does take 2 units more of regular insulin before eating cereal in the morning which is not every day  Oral hypoglycemic drugs: Metformin ER        Side effects from medications: None Proper timing of medications in relation to meals: Yes, 30 min ac.         Monitors blood glucose: About 2 times a day.    Glucometer:  FreeStyle     Blood Glucose readings from meter  download:  PREMEAL Breakfast Lunch Dinner  PCS  Overall  Glucose range:  49-136  ?   34-131   38- 171   Mean/median:  93    80    98     Hypoglycemia: Much more frequent recently since last Friday before supper. No symptoms even with sugar 34   Meals:  usually 2 meals per day at 10 AM and 5 PM.  protein sources: Egg [place] chicken        Physical activity: exercise: Just walking within the house             Microalbumin has been persistently high, her level was 202 in 5/14 and now improved  Wt Readings from Last 3 Encounters:  07/02/13 148 lb 3.2 oz (67.223 kg)  05/13/13 151 lb (68.493 kg)  04/01/13 151 lb 12.8 oz (68.856 kg)   Lab Results  Component Value Date   HGBA1C 6.5 06/29/2013   HGBA1C 7.4* 03/30/2013   HGBA1C 7.2* 12/29/2012   Lab Results  Component Value Date   MICROALBUR 25.5* 03/30/2013   LDLCALC 55 06/29/2013   CREATININE 1.2 06/29/2013     Appointment on 06/29/2013  Component Date Value Ref Range Status  . Hemoglobin A1C 06/29/2013 6.5  4.6 - 6.5 % Final   Glycemic Control Guidelines for People with Diabetes:Non Diabetic:  <6%Goal of  Therapy: <7%Additional Action Suggested:  >8%   . Sodium 06/29/2013 137  135 - 145 mEq/L Final  . Potassium 06/29/2013 4.5  3.5 - 5.1 mEq/L Final  . Chloride 06/29/2013 106  96 - 112 mEq/L Final  . CO2 06/29/2013 23  19 - 32 mEq/L Final  . Glucose, Bld 06/29/2013 87  70 - 99 mg/dL Final  . BUN 06/29/2013 16  6 - 23 mg/dL Final  . Creatinine, Ser 06/29/2013 1.2  0.4 - 1.2 mg/dL Final  . Total Bilirubin 06/29/2013 0.3  0.2 - 1.2 mg/dL Final  . Alkaline Phosphatase 06/29/2013 58  39 - 117 U/L Final  . AST 06/29/2013 14  0 - 37 U/L Final  . ALT 06/29/2013 12  0 - 35 U/L Final  . Total Protein 06/29/2013 6.6  6.0 - 8.3 g/dL Final  . Albumin 06/29/2013 3.4* 3.5 - 5.2 g/dL Final  . Calcium 06/29/2013 9.5  8.4 - 10.5 mg/dL Final  . GFR 06/29/2013 58.79* >60.00 mL/min Final  . Cholesterol 06/29/2013 131  0 - 200 mg/dL Final   ATP III  Classification       Desirable:  < 200 mg/dL               Borderline High:  200 - 239 mg/dL          High:  > = 240 mg/dL  . Triglycerides 06/29/2013 117.0  0.0 - 149.0 mg/dL Final   Normal:  <150 mg/dLBorderline High:  150 - 199 mg/dL  . HDL 06/29/2013 52.90  >39.00 mg/dL Final  . VLDL 06/29/2013 23.4  0.0 - 40.0 mg/dL Final  . LDL Cholesterol 06/29/2013 55  0 - 99 mg/dL Final  . Total CHOL/HDL Ratio 06/29/2013 2   Final                  Men          Women1/2 Average Risk     3.4          3.3Average Risk          5.0          4.42X Average Risk          9.6          7.13X Average Risk          15.0          11.0                          Medication List       This list is accurate as of: 07/02/13 10:24 AM.  Always use your most recent med list.               aspirin 325 MG EC tablet  Take 325 mg by mouth daily.     cilostazol 100 MG tablet  Commonly known as:  PLETAL  Take 1 tablet (100 mg total) by mouth 2 (two) times daily.     cloNIDine 0.1 MG tablet  Commonly known as:  CATAPRES  TAKE ONE TABLET BY MOUTH TWICE DAILY     gabapentin 100 MG capsule  Commonly known as:  NEURONTIN  TAKE ONE CAPSULE BY MOUTH TWICE DAILY     glucose blood test strip  Commonly known as:  FREESTYLE LITE  Use as instructed to check blood sugar 4 times a day dx code 250.00     insulin detemir 100 UNIT/ML injection  Commonly  known as:  LEVEMIR  Inject 56 units in am and 20 in pm dx code 250.00     insulin regular 100 units/mL injection  Commonly known as:  NOVOLIN R,HUMULIN R  Inject 7-15 Units into the skin 2 (two) times daily before a meal. 7 -9 in am and 15 at supper     lisinopril 5 MG tablet  Commonly known as:  PRINIVIL,ZESTRIL  TAKE ONE TABLET BY MOUTH ONCE DAILY     metFORMIN 750 MG 24 hr tablet  Commonly known as:  GLUCOPHAGE-XR  TAKE ONE TABLET BY MOUTH ONCE DAILY     metoCLOPramide 10 MG tablet  Commonly known as:  REGLAN  Take 10 mg by mouth 4 (four) times daily.      omeprazole 20 MG capsule  Commonly known as:  PRILOSEC  TAKE ONE CAPSULE BY MOUTH ONCE DAILY     simvastatin 40 MG tablet  Commonly known as:  ZOCOR  TAKE ONE TABLET BY MOUTH ONCE DAILY EVERY EVENING     Vitamin D (Ergocalciferol) 50000 UNITS Caps capsule  Commonly known as:  DRISDOL  TAKE ONE CAPSULE BY MOUTH EVERY 7 DAYS        Allergies:  Allergies  Allergen Reactions  . Morphine And Related Other (See Comments)    Hallucenations   . Penicillins Rash    Swelling    Past Medical History  Diagnosis Date  . Hypertension   . Hyperlipidemia   . Diabetes mellitus   . Peripheral vascular disease   . Stroke   . Glaucoma   . 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 Refractive surgery      both eyes  . Back surgery      X2  . Abdominal hysterectomy    . Colonoscopy  July 09, 2012    Family History  Problem Relation Age of Onset  . Cancer Mother   . Heart attack Mother   . Diabetes Father   . Cancer Sister     Social History:  reports that she has been smoking Cigarettes.  She has been smoking about 1.00 pack per day. She has never used smokeless tobacco. She reports that she does not drink alcohol or use illicit drugs.  Review of Systems:  No fatigue recently  HYPERTENSION:  now taking clonidine and lisinopril low dose with  good control, not monitoring at home  HYPERLIPIDEMIA: The lipid abnormality consists of elevated LDL controlled with simvastatin. Apparently baseline LDL was 202  Lab Results  Component Value Date   CHOL 131 06/29/2013   HDL 52.90 06/29/2013   LDLCALC 55 06/29/2013   TRIG 117.0 06/29/2013   CHOLHDL 2 06/29/2013    Has had complaints of her legs burning and stinging, some pain on walking in her feet; feels much better with using gabapentin 2 times daily, unable to sleep at night  No pain in leg muscles on walking  She was started on  Reglan by her PCP several years ago and she thinks she was having problems with vomiting at that time. She takes this 3 times a day without side effects and is asking for refill  She has not had a PCP recently     Examination:   BP 132/50  Pulse 97  Temp(Src) 98 F (36.7 C)  Resp 16  Ht 5' 3.75" (1.619 m)  Wt 148  lb 3.2 oz (67.223 kg)  BMI 25.65 kg/m2  SpO2 95%  Body mass index is 25.65 kg/(m^2).   No pedal edema  ASSESSMENT/ PLAN:   Diabetes type 2   The patient's diabetes control is excellent although she is now getting excessive hypoglycemia especially in the afternoon See history of present illness for detailed discussion of her current blood sugar levels, patterns, problems identified and review of her day-to-day care A1c is generally higher than expected from her home readings and is lower than before No obvious cause for her hypoglycemia although her appetite is variable; however most of her low sugars are in the afternoon and she is consistent with her morning meal No evidence of renal dysfunction and no history suggestive of hypothyroidism  Recommended the following:  Reduce a.m. Levemir by 6 units  Reduce mealtime doses by at least one unit twice a day  Check more readings after breakfast  She will also continue metformin as well since renal function is normal  Hypertension: Good control Renal: She has mild microalbuminuria with normal renal function  Neuropathy: Symptoms  controlled with low-dose gabapentin  Hyperlipidemia: Excellent control with simvastatin  History of vitamin D deficiency: Needs followup level on the next visit   Counseling time over 50% of today's 25 minute visit  Elayne Snare 07/02/2013, 10:24 AM

## 2013-07-02 NOTE — Patient Instructions (Addendum)
Levemir insulin 50 in the morning and 20 in the evening. Regular insulin 6-8 units a.m. and 14 before supper   Call if sugar still low

## 2013-08-12 ENCOUNTER — Ambulatory Visit (INDEPENDENT_AMBULATORY_CARE_PROVIDER_SITE_OTHER): Payer: Medicare HMO | Admitting: Podiatry

## 2013-08-12 DIAGNOSIS — M79609 Pain in unspecified limb: Secondary | ICD-10-CM

## 2013-08-12 DIAGNOSIS — B351 Tinea unguium: Secondary | ICD-10-CM

## 2013-08-12 DIAGNOSIS — Q828 Other specified congenital malformations of skin: Secondary | ICD-10-CM

## 2013-08-12 DIAGNOSIS — M79673 Pain in unspecified foot: Secondary | ICD-10-CM

## 2013-08-12 DIAGNOSIS — E1059 Type 1 diabetes mellitus with other circulatory complications: Secondary | ICD-10-CM

## 2013-08-13 NOTE — Progress Notes (Signed)
Patient ID: Julie Brewer, female   DOB: February 08, 1950, 63 y.o.   MRN: [id number]  Subjective: This patient presents today complaining of painful toenails and plantar calluses  Objective: The toenails are hypertrophic, elongated, incurvated and discolored 6-10 Nucleated keratoses subsecond MPJ right and fifth MPJ right and second MPJ left and right heel  Assessment: Symptomatic onychomycoses 6-10 Porokeratoses x4  Plan: Nails x10 and keratoses x4 debrided without any bleeding  Reappoint x3 months

## 2013-08-28 ENCOUNTER — Encounter: Payer: Self-pay | Admitting: Family

## 2013-08-31 ENCOUNTER — Encounter: Payer: Self-pay | Admitting: Family

## 2013-08-31 ENCOUNTER — Ambulatory Visit (INDEPENDENT_AMBULATORY_CARE_PROVIDER_SITE_OTHER): Payer: Medicare HMO | Admitting: Family

## 2013-08-31 ENCOUNTER — Ambulatory Visit (HOSPITAL_COMMUNITY)
Admission: RE | Admit: 2013-08-31 | Discharge: 2013-08-31 | Disposition: A | Discharge status: Home / Self Care | Payer: Medicare HMO | Source: Ambulatory Visit | Attending: Family | Admitting: Family

## 2013-08-31 VITALS — BP 138/81 | HR 82 | Resp 16 | Ht 63.0 in | Wt 148.0 lb

## 2013-08-31 DIAGNOSIS — I739 Peripheral vascular disease, unspecified: Secondary | ICD-10-CM | POA: Diagnosis not present

## 2013-08-31 NOTE — Patient Instructions (Addendum)
Peripheral Vascular Disease Peripheral Vascular Disease (PVD), also called Peripheral Arterial Disease (PAD), is a circulation problem caused by cholesterol (atherosclerotic plaque) deposits in the arteries. PVD commonly occurs in the lower extremities (legs) but it can occur in other areas of the body, such as your arms. The cholesterol buildup in the arteries reduces blood flow which can cause pain and other serious problems. The presence of PVD can place a person at risk for Coronary Artery Disease (CAD).  CAUSES  Causes of PVD can be many. It is usually associated with more than one risk factor such as:   High Cholesterol.  Smoking.  Diabetes.  Lack of exercise or inactivity.  High blood pressure (hypertension).  Obesity.  Family history. SYMPTOMS   When the lower extremities are affected, patients with PVD may experience:  Leg pain with exertion or physical activity. This is called INTERMITTENT CLAUDICATION. This may present as cramping or numbness with physical activity. The location of the pain is associated with the level of blockage. For example, blockage at the abdominal level (distal abdominal aorta) may result in buttock or hip pain. Lower leg arterial blockage may result in calf pain.  As PVD becomes more severe, pain can develop with less physical activity.  In people with severe PVD, leg pain may occur at rest.  Other PVD signs and symptoms:  Leg numbness or weakness.  Coldness in the affected leg or foot, especially when compared to the other leg.  A change in leg color.  Patients with significant PVD are more prone to ulcers or sores on toes, feet or legs. These may take longer to heal or may reoccur. The ulcers or sores can become infected.  If signs and symptoms of PVD are ignored, gangrene may occur. This can result in the loss of toes or loss of an entire limb.  Not all leg pain is related to PVD. Other medical conditions can cause leg pain such  as:  Blood clots (embolism) or Deep Vein Thrombosis.  Inflammation of the blood vessels (vasculitis).  Spinal stenosis. DIAGNOSIS  Diagnosis of PVD can involve several different types of tests. These can include:  Pulse Volume Recording Method (PVR). This test is simple, painless and does not involve the use of X-rays. PVR involves measuring and comparing the blood pressure in the arms and legs. An ABI (Ankle-Brachial Index) is calculated. The normal ratio of blood pressures is 1. As this number becomes smaller, it indicates more severe disease.  < 0.95 - indicates significant narrowing in one or more leg vessels.  <0.8 - there will usually be pain in the foot, leg or buttock with exercise.  <0.4 - will usually have pain in the legs at rest.  <0.25 - usually indicates limb threatening PVD.  Doppler detection of pulses in the legs. This test is painless and checks to see if you have a pulses in your legs/feet.  A dye or contrast material (a substance that highlights the blood vessels so they show up on x-ray) may be given to help your caregiver better see the arteries for the following tests. The dye is eliminated from your body by the kidney's. Your caregiver may order blood work to check your kidney function and other laboratory values before the following tests are performed:  Magnetic Resonance Angiography (MRA). An MRA is a picture study of the blood vessels and arteries. The MRA machine uses a large magnet to produce images of the blood vessels.  Computed Tomography Angiography (CTA). A CTA   is a specialized x-ray that looks at how the blood flows in your blood vessels. An IV may be inserted into your arm so contrast dye can be injected.  Angiogram. Is a procedure that uses x-rays to look at your blood vessels. This procedure is minimally invasive, meaning a small incision (cut) is made in your groin. A small tube (catheter) is then inserted into the artery of your groin. The catheter  is guided to the blood vessel or artery your caregiver wants to examine. Contrast dye is injected into the catheter. X-rays are then taken of the blood vessel or artery. After the images are obtained, the catheter is taken out. TREATMENT  Treatment of PVD involves many interventions which may include:  Lifestyle changes:  Quitting smoking.  Exercise.  Following a low fat, low cholesterol diet.  Control of diabetes.  Foot care is very important to the PVD patient. Good foot care can help prevent infection.  Medication:  Cholesterol-lowering medicine.  Blood pressure medicine.  Anti-platelet drugs.  Certain medicines may reduce symptoms of Intermittent Claudication.  Interventional/Surgical options:  Angioplasty. An Angioplasty is a procedure that inflates a balloon in the blocked artery. This opens the blocked artery to improve blood flow.  Stent Implant. A wire mesh tube (stent) is placed in the artery. The stent expands and stays in place, allowing the artery to remain open.  Peripheral Bypass Surgery. This is a surgical procedure that reroutes the blood around a blocked artery to help improve blood flow. This type of procedure may be performed if Angioplasty or stent implants are not an option. SEEK IMMEDIATE MEDICAL CARE IF:   You develop pain or numbness in your arms or legs.  Your arm or leg turns cold, becomes blue in color.  You develop redness, warmth, swelling and pain in your arms or legs. MAKE SURE YOU:   Understand these instructions.  Will watch your condition.  Will get help right away if you are not doing well or get worse. Document Released: 02/23/2004 Document Revised: 04/09/2011 Document Reviewed: 01/20/2008 ExitCare Patient Information 2015 [employer]. This information is not intended to replace advice given to you by your health care provider. Make sure you discuss any questions you have with your health care provider.   Smoking  Cessation Quitting smoking is important to your health and has many advantages. However, it is not always easy to quit since nicotine is a very addictive drug. Oftentimes, people try 3 times or more before being able to quit. This document explains the best ways for you to prepare to quit smoking. Quitting takes hard work and a lot of effort, but you can do it. ADVANTAGES OF QUITTING SMOKING  You will live longer, feel better, and live better.  Your body will feel the impact of quitting smoking almost immediately.  Within 20 minutes, blood pressure decreases. Your pulse returns to its normal level.  After 8 hours, carbon monoxide levels in the blood return to normal. Your oxygen level increases.  After 24 hours, the chance of having a heart attack starts to decrease. Your breath, hair, and body stop smelling like smoke.  After 48 hours, damaged nerve endings begin to recover. Your sense of taste and smell improve.  After 72 hours, the body is virtually free of nicotine. Your bronchial tubes relax and breathing becomes easier.  After 2 to 12 weeks, lungs can hold more air. Exercise becomes easier and circulation improves.  The risk of having a heart attack, stroke, cancer,   or lung disease is greatly reduced.  After 1 year, the risk of coronary heart disease is cut in half.  After 5 years, the risk of stroke falls to the same as a nonsmoker.  After 10 years, the risk of lung cancer is cut in half and the risk of other cancers decreases significantly.  After 15 years, the risk of coronary heart disease drops, usually to the level of a nonsmoker.  If you are pregnant, quitting smoking will improve your chances of having a healthy baby.  The people you live with, especially any children, will be healthier.  You will have extra money to spend on things other than cigarettes. QUESTIONS TO THINK ABOUT BEFORE ATTEMPTING TO QUIT You may want to talk about your answers with your health care  provider.  Why do you want to quit?  If you tried to quit in the past, what helped and what did not?  What will be the most difficult situations for you after you quit? How will you plan to handle them?  Who can help you through the tough times? Your family? Friends? A health care provider?  What pleasures do you get from smoking? What ways can you still get pleasure if you quit? Here are some questions to ask your health care provider:  How can you help me to be successful at quitting?  What medicine do you think would be best for me and how should I take it?  What should I do if I need more help?  What is smoking withdrawal like? How can I get information on withdrawal? GET READY  Set a quit date.  Change your environment by getting rid of all cigarettes, ashtrays, matches, and lighters in your home, car, or work. Do not let people smoke in your home.  Review your past attempts to quit. Think about what worked and what did not. GET SUPPORT AND ENCOURAGEMENT You have a better chance of being successful if you have help. You can get support in many ways.  Tell your family, friends, and coworkers that you are going to quit and need their support. Ask them not to smoke around you.  Get individual, group, or telephone counseling and support. Programs are available at local hospitals and health centers. Call your local health department for information about programs in your area.  Spiritual beliefs and practices may help some smokers quit.  Download a "quit meter" on your computer to keep track of quit statistics, such as how long you have gone without smoking, cigarettes not smoked, and money saved.  Get a self-help book about quitting smoking and staying off tobacco. LEARN NEW SKILLS AND BEHAVIORS  Distract yourself from urges to smoke. Talk to someone, go for a walk, or occupy your time with a task.  Change your normal routine. Take a different route to work. Drink tea  instead of coffee. Eat breakfast in a different place.  Reduce your stress. Take a hot bath, exercise, or read a book.  Plan something enjoyable to do every day. Reward yourself for not smoking.  Explore interactive web-based programs that specialize in helping you quit. GET MEDICINE AND USE IT CORRECTLY Medicines can help you stop smoking and decrease the urge to smoke. Combining medicine with the above behavioral methods and support can greatly increase your chances of successfully quitting smoking.  Nicotine replacement therapy helps deliver nicotine to your body without the negative effects and risks of smoking. Nicotine replacement therapy includes nicotine gum, lozenges, inhalers,   nasal sprays, and skin patches. Some may be available over-the-counter and others require a prescription.  Antidepressant medicine helps people abstain from smoking, but how this works is unknown. This medicine is available by prescription.  Nicotinic receptor partial agonist medicine simulates the effect of nicotine in your brain. This medicine is available by prescription. Ask your health care provider for advice about which medicines to use and how to use them based on your health history. Your health care provider will tell you what side effects to look out for if you choose to be on a medicine or therapy. Carefully read the information on the package. Do not use any other product containing nicotine while using a nicotine replacement product.  RELAPSE OR DIFFICULT SITUATIONS Most relapses occur within the first 3 months after quitting. Do not be discouraged if you start smoking again. Remember, most people try several times before finally quitting. You may have symptoms of withdrawal because your body is used to nicotine. You may crave cigarettes, be irritable, feel very hungry, cough often, get headaches, or have difficulty concentrating. The withdrawal symptoms are only temporary. They are strongest when you  first quit, but they will go away within 10-14 days. To reduce the chances of relapse, try to:  Avoid drinking alcohol. Drinking lowers your chances of successfully quitting.  Reduce the amount of caffeine you consume. Once you quit smoking, the amount of caffeine in your body increases and can give you symptoms, such as a rapid heartbeat, sweating, and anxiety.  Avoid smokers because they can make you want to smoke.  Do not let weight gain distract you. Many smokers will gain weight when they quit, usually less than 10 pounds. Eat a healthy diet and stay active. You can always lose the weight gained after you quit.  Find ways to improve your mood other than smoking. FOR MORE INFORMATION  www.smokefree.gov  Document Released: 01/09/2001 Document Revised: 06/01/2013 Document Reviewed: 04/26/2011 Exit[employer] Patient Information 2015 [employer]. This information is not intended to replace advice given to you by your health care provider. Make sure you discuss any questions you have with your health care provider.  

## 2013-08-31 NOTE — Progress Notes (Signed)
[facility]  [facility]ISTORY [facility] PHYS[facility] -PAD  History of Present Illness Julie Brewer is a 63 y.o. female patient of Dr. Trula Slade.  The patient is back today for followup of her peripheral vascular disease. When Dr. Trula Slade met her in 2011 she was having claudication in her left leg at approximately one block. We treated her with cilostazol, and she did much better.  Lately her legs feel heavy after walking about 10 minutes, relieved with rest, denies non healing wounds. She states she had a TIA in about 2010 as manifested by slurred speech that resolved, denies hemiparesis, denies further loss of vision. She states that her loss of vision in both eyes is due to glaucoma.   She continues to be medically managed for hypertension and hyperlipidemia as well as her diabetes. She is on a statin as well as an ACE inhibitor.  The patient denies New Medical or Surgical History.  Pt Diabetic: Yes, states in control Pt smoker: smoker  (1 ppd, started at age 20 yrs)  Pt meds include: Statin :Yes ASA: Yes Other anticoagulants/antiplatelets: Pletal  Past Medical History  Diagnosis Date  . Hypertension   . Hyperlipidemia   . Diabetes mellitus   . Peripheral vascular disease   . Stroke   . Glaucoma   . 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 Smoking status: Current Every Day Smoker -- 1.00 packs/day    Types: Cigarettes  . Smokeless tobacco: Never Used  . Alcohol Use: No    Family History Family History  Problem Relation Age of Onset  . Cancer Mother   . Heart disease Mother   . Diabetes Father   . Cancer Brother   . Cancer Brother     Past Surgical History  Procedure Laterality Date  . Refractive surgery      both eyes  . Back surgery      X2  . Abdominal hysterectomy    . Colonoscopy  July 09, 2012  . Spine surgery      Allergies   Allergen Reactions  . Morphine And Related Other (See Comments)    Hallucenations   . Penicillins Rash    Swelling    Current Outpatient Prescriptions  Medication Sig Dispense Refill  . aspirin 325 MG EC tablet Take 325 mg by mouth daily.      . cilostazol (PLETAL) 100 MG tablet Take 1 tablet (100 mg total) by mouth 2 (two) times daily.  60 tablet  11  . cloNIDine (CATAPRES) 0.1 MG tablet TAKE ONE TABLET BY MOUTH TWICE DAILY  180 tablet  1  . gabapentin (NEURONTIN) 100 MG capsule TAKE ONE CAPSULE BY MOUTH TWICE DAILY  180 capsule  0  . glucose blood (FREESTYLE LITE) test strip Use as instructed to check blood sugar 4 times a day dx code 250.00  120 each  5  . insulin detemir (LEVEMIR) 100 UNIT/ML injection Inject 56 units in am and 20 in pm dx code 250.00      . insulin regular (NOVOLIN R,HUMULIN R) 100 units/mL injection Inject 7-15 Units into the skin 2 (two) times daily before a meal. 7 -9 in am and 15 at supper      . lisinopril (PRINIVIL,ZESTRIL) 5 MG tablet TAKE ONE TABLET BY MOUTH ONCE DAILY  90 tablet  0  . metFORMIN (GLUCOPHAGE-XR) 750 MG  24 hr tablet TAKE ONE TABLET BY MOUTH ONCE DAILY  90 tablet  0  . metoCLOPramide (REGLAN) 10 MG tablet Take 1 tablet (10 mg total) by mouth 4 (four) times daily.  120 tablet  5  . omeprazole (PRILOSEC) 20 MG capsule TAKE ONE CAPSULE BY MOUTH ONCE DAILY  90 capsule  0  . simvastatin (ZOCOR) 40 MG tablet TAKE ONE TABLET BY MOUTH ONCE DAILY EVERY EVENING  90 tablet  1  . Vitamin D, Ergocalciferol, (DRISDOL) 50000 UNITS CAPS capsule TAKE ONE CAPSULE BY MOUTH EVERY 7 DAYS  12 capsule  0   No current facility-administered medications for this visit.    ROS: See HPI for pertinent positives and negatives.   Physical Examination  Filed Vitals:   08/31/13 1618  BP: 138/81  Pulse: 82  Resp: 16  Height: _0  (1.6 m)  Weight: 148 lb (67.132 kg)  SpO2: 99%   Body mass index is 26.22 kg/(m^2).  General: A&O x 3, WDWN. Gait: normal, using cane  for the blind Eyes: PERRLA. Pulmonary: CTAB, without wheezes , rales or rhonchi. Cardiac: regular Rythm , without detected murmur.         Carotid Bruits Right Left   Negative Negative  Aorta is not palpable. Radial pulses: are 2+ palpable and =.                           VASCULAR EXAM: Extremities without ischemic changes  without Gangrene; without open wounds.                                                                                                          LE Pulses Right Left       FEMORAL  3+ palpable  2+ palpable        POPLITEAL  not palpable   not palpable       POSTERIOR TIBIAL  2+ palpable   not palpable        DORSALIS PEDIS      ANTERIOR TIBIAL 2+ palpable  2+ palpable    Abdomen: soft, NT, no masses. Skin: no rashes, no ulcers noted. Musculoskeletal: no muscle wasting or atrophy.  Neurologic: A&O X 3; Appropriate Affect ; SENSATION: normal; MOTOR FUNCTION:  moving all extremities equally, motor strength 5/5 in UE's, 4/5 in LE's. Speech is fluent/normal. CN 2-12 intact except for loss of vision.   Non-Invasive Vascular Imaging: DATE: 08/31/2013 ABI: RIGHT 1.0, Waveforms: bi and triphasic, TBI: 0.91;  LEFT 0.67, TBI: 0.50, Waveforms: biphasic Previous (09/01/12) ABI's: Right: 1.02, Left: 0.70      ASSESSMENT: Julie Brewer is a 63 y.o. female who presents with heaviness in both legs after walking about 10 minutes, relieved with rest, no non healing wounds. 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 She takes a daily ASA, statin, and Pletal.   PLAN:  I discussed in depth with the patient the nature of atherosclerosis, and emphasized the importance of maximal medical  management including strict control of blood pressure, blood glucose, and lipid levels, obtaining regular exercise, and cessation of smoking.  The patient is aware that without maximal medical management the underlying atherosclerotic disease  process will progress, limiting the benefit of any interventions.  Since she has difficulty walking due to her blindness, advised daily arms and legs exercises while seated, demonstrated and discussed.  She was counseled re smoking cessation.  Based on the patient's vascular studies and examination, pt will return to clinic in 1 year for ABI's.   The patient was given information about PAD including signs, symptoms, treatment, what symptoms should prompt the patient to seek immediate medical care, and risk reduction measures to take.  Clemon Chambers, RN, MSN, FNP-C [facility] Phone: [phone]  Clinic MD: Early on call  08/31/2013 4:21 PM

## 2013-09-21 ENCOUNTER — Other Ambulatory Visit: Payer: Self-pay | Admitting: Endocrinology

## 2013-09-28 ENCOUNTER — Other Ambulatory Visit: Payer: Medicare HMO

## 2013-09-29 ENCOUNTER — Other Ambulatory Visit: Payer: Self-pay | Admitting: Endocrinology

## 2013-09-30 ENCOUNTER — Other Ambulatory Visit (INDEPENDENT_AMBULATORY_CARE_PROVIDER_SITE_OTHER): Payer: Medicare HMO

## 2013-09-30 DIAGNOSIS — E559 Vitamin D deficiency, unspecified: Secondary | ICD-10-CM

## 2013-09-30 DIAGNOSIS — K3184 Gastroparesis: Secondary | ICD-10-CM

## 2013-09-30 DIAGNOSIS — E1143 Type 2 diabetes mellitus with diabetic autonomic (poly)neuropathy: Secondary | ICD-10-CM

## 2013-09-30 DIAGNOSIS — E1149 Type 2 diabetes mellitus with other diabetic neurological complication: Secondary | ICD-10-CM

## 2013-09-30 LAB — MICROALBUMIN / CREATININE URINE RATIO
Creatinine,U: 155.4 mg/dL
Microalb Creat Ratio: 26.4 mg/g (ref 0.0–30.0)
Microalb, Ur: 41 mg/dL — ABNORMAL HIGH (ref 0.0–1.9)

## 2013-09-30 LAB — COMPREHENSIVE METABOLIC PANEL
ALBUMIN: 3.3 g/dL — AB (ref 3.5–5.2)
ALK PHOS: 55 U/L (ref 39–117)
ALT: 11 U/L (ref 0–35)
AST: 12 U/L (ref 0–37)
BUN: 13 mg/dL (ref 6–23)
CALCIUM: 9.3 mg/dL (ref 8.4–10.5)
CO2: 24 mEq/L (ref 19–32)
Chloride: 105 mEq/L (ref 96–112)
Creatinine, Ser: 1.1 mg/dL (ref 0.4–1.2)
GFR: 64.94 mL/min (ref >60.00)
Glucose, Bld: 86 mg/dL (ref 70–99)
POTASSIUM: 4.2 meq/L (ref 3.5–5.1)
SODIUM: 138 meq/L (ref 135–145)
TOTAL PROTEIN: 6.6 g/dL (ref 6.0–8.3)
Total Bilirubin: 0.4 mg/dL (ref 0.2–1.2)

## 2013-09-30 LAB — VITAMIN D 25 HYDROXY (VIT D DEFICIENCY, FRACTURES): VITD: 100.9 ng/mL — AB (ref 30.00–100.00)

## 2013-09-30 LAB — HEMOGLOBIN A1C: Hgb A1c MFr Bld: 6.8 % — ABNORMAL HIGH (ref 4.6–6.5)

## 2013-09-30 LAB — TSH: TSH: 0.57 u[IU]/mL (ref 0.35–4.50)

## 2013-10-02 ENCOUNTER — Other Ambulatory Visit: Payer: Self-pay | Admitting: *Deleted

## 2013-10-02 ENCOUNTER — Ambulatory Visit (INDEPENDENT_AMBULATORY_CARE_PROVIDER_SITE_OTHER): Payer: Medicare HMO | Admitting: Endocrinology

## 2013-10-02 ENCOUNTER — Encounter: Payer: Self-pay | Admitting: Endocrinology

## 2013-10-02 VITALS — BP 124/60 | HR 90 | Temp 98.6°F | Resp 14 | Ht 63.0 in | Wt 147.0 lb

## 2013-10-02 DIAGNOSIS — E559 Vitamin D deficiency, unspecified: Secondary | ICD-10-CM

## 2013-10-02 DIAGNOSIS — E1143 Type 2 diabetes mellitus with diabetic autonomic (poly)neuropathy: Secondary | ICD-10-CM

## 2013-10-02 DIAGNOSIS — I10 Essential (primary) hypertension: Secondary | ICD-10-CM

## 2013-10-02 DIAGNOSIS — K3184 Gastroparesis: Secondary | ICD-10-CM

## 2013-10-02 DIAGNOSIS — E1149 Type 2 diabetes mellitus with other diabetic neurological complication: Secondary | ICD-10-CM

## 2013-10-02 MED ORDER — GLUCOSE BLOOD VI STRP: ORAL_STRIP | Status: DC

## 2013-10-02 NOTE — Patient Instructions (Addendum)
Vitamin D once a MONTH ONLY  LEVEMIR INSULIN 40 IN AM AND 15 IN PM  REGULAR INSULIN 5-7 UNITS IN AM AND IF IF SUGAR AROUND 7-8 PM IN UNDER 100 THEN REDUCE PM REGULAR TO 12  Please check blood sugars at least half the time about 2 hours after any meal and 3 times per week on waking up. Please bring blood sugar monitor to each visit

## 2013-10-02 NOTE — Progress Notes (Signed)
Patient ID: Julie Brewer, female   DOB: August 19, 1950, 63 y.o.   MRN: [id number]   Reason for Appointment: Diabetes follow-up   History of Present Illness   Diagnosis: Type 2 DIABETES MELITUS, date of diagnosis:  1985  Prior history: She has been on insulin since diagnosis and on Lantus previously Also at some point had been started on Glucophage several years ago also Because of insurance preference Lantus was changed to Levemir and is using this twice a day She refuses to use analog rapid acting insulin because of cost and is using regular insulin for several years   Her blood sugars are generally well controlled and A1c usually under 7%  Recent history:  Insulin regimen: Levemir insulin 50 in the morning and 20 in the evening. Regular insulin 7-9 units a.m. and 15 before supper            Her blood sugars are again significantly low despite reducing her Levemir insulin on the last visit She feels that her appetite is still relatively low although she is eating a fairly good breakfast Most of her recent low sugars are before supper Again she does not check her blood sugar after meals She had no test strips until about 10 days ago because of the cost She reports no symptoms of hypoglycemia even when her blood sugar is as low as 47 which it was yesterday Has been fairly compliant with her insulin and takes her mealtime insulin up to 30 minutes before eating as directed.  She does take 2 units more of regular insulin before eating cereal in the morning   Oral hypoglycemic drugs: Metformin ER        Side effects from medications: None Proper timing of medications in relation to meals: Yes, 30 min ac.         Monitors blood glucose: About 2 times a day.    Glucometer:  FreeStyle     Blood Glucose readings from meter download:  PREMEAL Breakfast Lunch Dinner Bedtime Overall  Glucose range:  71-107    47-133     Mean/median:  88    74    81     Hypoglycemia: Low before supper on  several occasions including the last 3 days   Meals:  usually 2 meals per day at 10 AM and 5 PM.  protein sources: Egg [place] chicken        Physical activity: exercise: Just walking within the house             Microalbumin has been persistently high, her level was 202 in 5/14 and now improved  Wt Readings from Last 3 Encounters:  10/02/13 147 lb (66.679 kg)  08/31/13 148 lb (67.132 kg)  07/02/13 148 lb 3.2 oz (67.223 kg)   Lab Results  Component Value Date   HGBA1C 6.8* 09/30/2013   HGBA1C 6.5 06/29/2013   HGBA1C 7.4* 03/30/2013   Lab Results  Component Value Date   MICROALBUR 41.0* 09/30/2013   LDLCALC 55 06/29/2013   CREATININE 1.1 09/30/2013     Appointment on 09/30/2013  Component Date Value Ref Range Status  . Hemoglobin A1C 09/30/2013 6.8* 4.6 - 6.5 % Final   Glycemic Control Guidelines for People with Diabetes:Non Diabetic:  <6%Goal of Therapy: <7%Additional Action Suggested:  >8%   . Sodium 09/30/2013 138  135 - 145 mEq/L Final  . Potassium 09/30/2013 4.2  3.5 - 5.1 mEq/L Final  . Chloride 09/30/2013 105  96 - 112 mEq/L Final  .  CO2 09/30/2013 24  19 - 32 mEq/L Final  . Glucose, Bld 09/30/2013 86  70 - 99 mg/dL Final  . BUN 09/30/2013 13  6 - 23 mg/dL Final  . Creatinine, Ser 09/30/2013 1.1  0.4 - 1.2 mg/dL Final  . Total Bilirubin 09/30/2013 0.4  0.2 - 1.2 mg/dL Final  . Alkaline Phosphatase 09/30/2013 55  39 - 117 U/L Final  . AST 09/30/2013 12  0 - 37 U/L Final  . ALT 09/30/2013 11  0 - 35 U/L Final  . Total Protein 09/30/2013 6.6  6.0 - 8.3 g/dL Final  . Albumin 09/30/2013 3.3* 3.5 - 5.2 g/dL Final  . Calcium 09/30/2013 9.3  8.4 - 10.5 mg/dL Final  . GFR 09/30/2013 64.94  >60.00 mL/min Final  . TSH 09/30/2013 0.57  0.35 - 4.50 uIU/mL Final  . Microalb, Ur 09/30/2013 41.0* 0.0 - 1.9 mg/dL Final  . Creatinine,U 09/30/2013 155.4   Final  . Microalb Creat Ratio 09/30/2013 26.4  0.0 - 30.0 mg/g Final  . VITD 09/30/2013 100.90* 30.00 - 100.00 ng/mL Final       Medication List       This list is accurate as of: 10/02/13 11:20 AM.  Always use your most recent med list.               aspirin 325 MG EC tablet  Take 325 mg by mouth daily.     cilostazol 100 MG tablet  Commonly known as:  PLETAL  Take 1 tablet (100 mg total) by mouth 2 (two) times daily.     cloNIDine 0.1 MG tablet  Commonly known as:  CATAPRES  TAKE ONE TABLET BY MOUTH TWICE DAILY     gabapentin 100 MG capsule  Commonly known as:  NEURONTIN  TAKE ONE CAPSULE BY MOUTH TWICE DAILY     glucose blood test strip  Commonly known as:  FREESTYLE LITE  Use as instructed to check blood sugar 4 times a day dx code 250.00     insulin detemir 100 UNIT/ML injection  Commonly known as:  LEVEMIR  Inject 50 units in am and 20 in pm dx code 250.00     insulin regular 100 units/mL injection  Commonly known as:  NOVOLIN R,HUMULIN R  Inject 6-8 Units into the skin 2 (two) times daily before a meal. 7 -9 in am and 15 at supper     lisinopril 5 MG tablet  Commonly known as:  PRINIVIL,ZESTRIL  TAKE ONE TABLET BY MOUTH ONCE DAILY     metFORMIN 750 MG 24 hr tablet  Commonly known as:  GLUCOPHAGE-XR  TAKE ONE TABLET BY MOUTH ONCE DAILY     metoCLOPramide 10 MG tablet  Commonly known as:  REGLAN  Take 1 tablet (10 mg total) by mouth 4 (four) times daily.     omeprazole 20 MG capsule  Commonly known as:  PRILOSEC  TAKE ONE CAPSULE BY MOUTH ONCE DAILY     simvastatin 40 MG tablet  Commonly known as:  ZOCOR  TAKE ONE TABLET BY MOUTH ONCE DAILY EVERY EVENING     Vitamin D (Ergocalciferol) 50000 UNITS Caps capsule  Commonly known as:  DRISDOL  TAKE ONE CAPSULE BY MOUTH EVERY 7 DAYS        Allergies:  Allergies  Allergen Reactions  . Morphine And Related Other (See Comments)    Hallucenations   . Penicillins Rash    Swelling    Past Medical History  Diagnosis Date  .  Hypertension   . Hyperlipidemia   . Diabetes mellitus   . Peripheral vascular disease   . Stroke   .  Glaucoma   . 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 Refractive surgery      both eyes  . Back surgery      X2  . Abdominal hysterectomy    . Colonoscopy  July 09, 2012  . Spine surgery      Family History  Problem Relation Age of Onset  . Cancer Mother   . Heart disease Mother   . Diabetes Father   . Cancer Brother   . Cancer Brother     Social History:  reports that she has been smoking Cigarettes.  She has been smoking about 1.00 pack per day. She has never used smokeless tobacco. She reports that she does not drink alcohol or use illicit drugs.  Review of Systems:   HYPERTENSION:  now taking clonidine and lisinopril low dose with  good control and no orthostatic lightheadedness  HYPERLIPIDEMIA: The lipid abnormality consists of elevated LDL controlled with simvastatin. Apparently baseline LDL was 202  Lab Results  Component Value Date   CHOL 131 06/29/2013   HDL 52.90 06/29/2013   LDLCALC 55 06/29/2013   TRIG 117.0 06/29/2013   CHOLHDL 2 06/29/2013    Has had complaints of her legs burning and stinging, some pain on walking in her feet; feels  better with using gabapentin 2 times daily, unable to sleep at night previously  No pain in leg muscles on walking  She was started on Reglan by her PCP several years ago and she thinks she was having problems with vomiting at that time. She takes this 3 times a day without side effects and it does help her reflux symptoms  She has not had  an eye exam because of the cost of the visit     Examination:   BP 124/60  Pulse 90  Temp(Src) 98.6 F (37 C)  Resp 14  Ht 5\' 3"  (1.6 m)  Wt 147 lb (66.679 kg)  BMI 26.05 kg/m2  SpO2 99%  Body mass index is 26.05 kg/(m^2).   No pedal edema  ASSESSMENT/ PLAN:   Diabetes type 2   The patient's diabetes control is excellent although she is again getting excessive hypoglycemia  especially in the afternoon See history of present illness for description of her current blood sugar levels, patterns, problems identified and review of her day-to-day care A1c is generally higher than expected from her home readings and surprisingly is higher than the last time Her low sugars may be occurring mostly recently No obvious cause for her hypoglycemia; creatinine is normal and so is her TSH  Recommended the following:  Reduce all the insulin doses as below; will need to adjust her suppertime dose based on postprandial reading  Call if blood sugar is not improved  Check more readings after breakfast and supper  She will also continue metformin unchanged since renal function is normal  Hypertension: Good control  Renal: She has mild microalbuminuria with normal renal function  Neuropathy: Symptoms  controlled with low-dose gabapentin  Hyperlipidemia: Excellent control with simvastatin  History of vitamin D deficiency: Needs to reduce the dose to once a month since her level is mildly increased with the weekly dose  Counseling time over 50% of today's 25 minute  visit  Patient Instructions  Vitamin D once a MONTH ONLY  LEVEMIR INSULIN 40 IN AM AND 15 IN PM  REGULAR INSULIN 5-7 UNITS IN AM AND IF IF SUGAR AROUND 7-8 PM IN UNDER 100 THEN REDUCE PM REGULAR TO 12  Please check blood sugars at least half the time about 2 hours after any meal and 3 times per week on waking up. Please bring blood sugar monitor to each visit      Jhony Antrim 10/02/2013, 11:20 AM

## 2013-10-05 ENCOUNTER — Other Ambulatory Visit: Payer: Self-pay | Admitting: Endocrinology

## 2013-10-05 ENCOUNTER — Other Ambulatory Visit: Payer: Self-pay | Admitting: Surgery

## 2013-11-14 ENCOUNTER — Other Ambulatory Visit: Payer: Self-pay | Admitting: Endocrinology

## 2013-11-18 ENCOUNTER — Ambulatory Visit: Payer: Medicare HMO | Admitting: Podiatry

## 2013-12-02 ENCOUNTER — Ambulatory Visit (INDEPENDENT_AMBULATORY_CARE_PROVIDER_SITE_OTHER): Payer: Medicare HMO | Admitting: Podiatry

## 2013-12-02 ENCOUNTER — Ambulatory Visit (INDEPENDENT_AMBULATORY_CARE_PROVIDER_SITE_OTHER): Payer: Medicare HMO | Admitting: Endocrinology

## 2013-12-02 ENCOUNTER — Encounter: Payer: Self-pay | Admitting: Endocrinology

## 2013-12-02 VITALS — BP 138/60 | HR 90 | Temp 98.0°F | Resp 16 | Ht 63.0 in | Wt 146.4 lb

## 2013-12-02 DIAGNOSIS — B351 Tinea unguium: Secondary | ICD-10-CM | POA: Diagnosis not present

## 2013-12-02 DIAGNOSIS — IMO0002 Reserved for concepts with insufficient information to code with codable children: Secondary | ICD-10-CM

## 2013-12-02 DIAGNOSIS — E1165 Type 2 diabetes mellitus with hyperglycemia: Secondary | ICD-10-CM

## 2013-12-02 DIAGNOSIS — Q828 Other specified congenital malformations of skin: Secondary | ICD-10-CM

## 2013-12-02 DIAGNOSIS — E119 Type 2 diabetes mellitus without complications: Secondary | ICD-10-CM

## 2013-12-02 DIAGNOSIS — M79676 Pain in unspecified toe(s): Secondary | ICD-10-CM

## 2013-12-02 NOTE — Patient Instructions (Signed)
LEVEMIR INSULIN 40 IN AM AND 12 IN PM   REGULAR INSULIN 5-7 UNITS IN AM  AND PM REGULAR = 17  Please check blood sugars at least half the time about 2 hours after any meal and 3-4 times per week on waking up. Please bring blood sugar monitor to each visit

## 2013-12-02 NOTE — Progress Notes (Signed)
Patient ID: Julie Brewer, female   DOB: 1950-10-28, 63 y.o.   MRN: [id number]   Reason for Appointment: Diabetes follow-up   History of Present Illness   Diagnosis: Type 2 DIABETES MELITUS, date of diagnosis:  1985  Prior history: She has been on insulin since diagnosis and on Lantus previously Also at some point had been started on Glucophage several years ago also Because of insurance preference Lantus was changed to Levemir and is using this twice a day She refuses to use analog rapid acting insulin because of cost and is using regular insulin for several years   Her blood sugars are generally well controlled and A1c usually under 7%  Recent history:  Insulin regimen: Levemir insulin 40 in the morning and 15 in the evening. Regular insulin 5-7 units a.m. and 15 before supper            Her blood sugars are not as low after her last visit with reducing her morning Levemir by 10 units  However she has not checked any readings before supper time when she was having a low sugars previously Apparently does not have any symptoms of hypoglycemia even with blood sugars as low as 47 She may be requiring smaller doses of insulin because of her relatively low appetite However she is eating a full meal from [employer] in the evenings and this usually is balanced without excessive fat intake or desserts except occasional small cookie Postprandial readings: These appear to be mostly high after supper and she has done a few readings at that time Most of her monitoring is in the morning before breakfast She does take 2 units more of regular insulin before eating cereal in the morning   Hypoglycemia: none  Oral hypoglycemic drugs: Metformin ER        Side effects from medications: None Proper timing of medications in relation to meals: Yes, 30 min ac.         Monitors blood glucose: About 2 times a day.    Glucometer:  FreeStyle     Blood Glucose readings from meter download:  PREMEAL  Breakfast Lunch Dinner PCS Overall  Glucose range: 73-122   98-299   Mean/median: 105   200 136      Meals:  usually 2 meals per day at 10 AM and 5 PM.  protein sources: Egg [place] chicken        Physical activity: exercise: Just walking within the house             Microalbumin has been persistently high, her level was 202 in 5/14 and now improved  Wt Readings from Last 3 Encounters:  12/02/13 146 lb 6.4 oz (66.407 kg)  10/02/13 147 lb (66.679 kg)  08/31/13 148 lb (67.132 kg)   Lab Results  Component Value Date   HGBA1C 6.8* 09/30/2013   HGBA1C 6.5 06/29/2013   HGBA1C 7.4* 03/30/2013   Lab Results  Component Value Date   MICROALBUR 41.0* 09/30/2013   LDLCALC 55 06/29/2013   CREATININE 1.1 09/30/2013     No visits with results within 1 Week(s) from this visit. Latest known visit with results is:  Appointment on 09/30/2013  Component Date Value Ref Range Status  . Hgb A1c MFr Bld 09/30/2013 6.8* 4.6 - 6.5 % Final   Glycemic Control Guidelines for People with Diabetes:Non Diabetic:  <6%Goal of Therapy: <7%Additional Action Suggested:  >8%   . Sodium 09/30/2013 138  135 - 145 mEq/L Final  .  Potassium 09/30/2013 4.2  3.5 - 5.1 mEq/L Final  . Chloride 09/30/2013 105  96 - 112 mEq/L Final  . CO2 09/30/2013 24  19 - 32 mEq/L Final  . Glucose, Bld 09/30/2013 86  70 - 99 mg/dL Final  . BUN 09/30/2013 13  6 - 23 mg/dL Final  . Creatinine, Ser 09/30/2013 1.1  0.4 - 1.2 mg/dL Final  . Total Bilirubin 09/30/2013 0.4  0.2 - 1.2 mg/dL Final  . Alkaline Phosphatase 09/30/2013 55  39 - 117 U/L Final  . AST 09/30/2013 12  0 - 37 U/L Final  . ALT 09/30/2013 11  0 - 35 U/L Final  . Total Protein 09/30/2013 6.6  6.0 - 8.3 g/dL Final  . Albumin 09/30/2013 3.3* 3.5 - 5.2 g/dL Final  . Calcium 09/30/2013 9.3  8.4 - 10.5 mg/dL Final  . GFR 09/30/2013 64.94  >60.00 mL/min Final  . TSH 09/30/2013 0.57  0.35 - 4.50 uIU/mL Final  . Microalb, Ur 09/30/2013 41.0* 0.0 - 1.9 mg/dL Final  .  Creatinine,U 09/30/2013 155.4   Final  . Microalb Creat Ratio 09/30/2013 26.4  0.0 - 30.0 mg/g Final  . VITD 09/30/2013 100.90* 30.00 - 100.00 ng/mL Final      Medication List       This list is accurate as of: 12/02/13 10:53 AM.  Always use your most recent med list.               aspirin 325 MG EC tablet  Take 325 mg by mouth daily.     cilostazol 100 MG tablet  Commonly known as:  PLETAL  TAKE ONE TABLET BY MOUTH TWICE DAILY     cloNIDine 0.1 MG tablet  Commonly known as:  CATAPRES  TAKE ONE TABLET BY MOUTH TWICE DAILY     gabapentin 100 MG capsule  Commonly known as:  NEURONTIN  TAKE ONE CAPSULE BY MOUTH TWICE DAILY     glucose blood test strip  Commonly known as:  FREESTYLE LITE  Use as instructed to check blood sugar 2 times a day dx code 250.00     insulin detemir 100 UNIT/ML injection  Commonly known as:  LEVEMIR  Inject 40 units in am and 15 in pm dx code 250.00     insulin regular 100 units/mL injection  Commonly known as:  NOVOLIN R,HUMULIN R  Inject into the skin 2 (two) times daily before a meal. 6 in am and 15 at supper     lisinopril 5 MG tablet  Commonly known as:  PRINIVIL,ZESTRIL  TAKE ONE TABLET BY MOUTH ONCE DAILY     metFORMIN 750 MG 24 hr tablet  Commonly known as:  GLUCOPHAGE-XR  TAKE ONE TABLET BY MOUTH ONCE DAILY     metoCLOPramide 10 MG tablet  Commonly known as:  REGLAN  Take 1 tablet (10 mg total) by mouth 4 (four) times daily.     omeprazole 20 MG capsule  Commonly known as:  PRILOSEC  TAKE ONE CAPSULE BY MOUTH ONCE DAILY     simvastatin 40 MG tablet  Commonly known as:  ZOCOR  TAKE ONE TABLET BY MOUTH ONCE DAILY EVERY EVENING     Vitamin D (Ergocalciferol) 50000 UNITS Caps capsule  Commonly known as:  DRISDOL  TAKE ONE CAPSULE BY MOUTH EVERY 7 DAYS        Allergies:  Allergies  Allergen Reactions  . Morphine And Related Other (See Comments)    Hallucenations   . Penicillins Rash  Swelling    Past Medical  History  Diagnosis Date  . Hypertension   . Hyperlipidemia   . Diabetes mellitus   . Peripheral vascular disease   . Stroke   . Glaucoma   . 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 Refractive surgery      both eyes  . Back surgery      X2  . Abdominal hysterectomy    . Colonoscopy  July 09, 2012  . Spine surgery      Family History  Problem Relation Age of Onset  . Cancer Mother   . Heart disease Mother   . Diabetes Father   . Cancer Brother   . Cancer Brother     Social History:  reports that she has been smoking Cigarettes.  She has been smoking about 1.00 pack per day. She has never used smokeless tobacco. She reports that she does not drink alcohol or use illicit drugs.  Review of Systems:   HYPERTENSION:  now taking clonidine and lisinopril low dose with  good control and no orthostatic lightheadedness  HYPERLIPIDEMIA: The lipid abnormality consists of elevated LDL controlled with simvastatin. Apparently baseline LDL was 202  Lab Results  Component Value Date   CHOL 131 06/29/2013   HDL 52.90 06/29/2013   LDLCALC 55 06/29/2013   TRIG 117.0 06/29/2013   CHOLHDL 2 06/29/2013    Has had complaints of her legs burning and stinging, some pain on walking in her feet; feels  better with using gabapentin 2 times daily, unable to sleep at night previously  No pain in leg muscles on walking  She was started on Reglan by her PCP several years ago and she thinks she was having problems with vomiting at that time. She takes this 3 times a day without side effects and it does help her reflux symptoms  She has not had  an eye exam because of the cost of the visit     Examination:   BP 138/60 mmHg  Pulse 90  Temp(Src) 98 F (36.7 C)  Resp 16  Ht 5\' 3"  (1.6 m)  Wt 146 lb 6.4 oz (66.407 kg)  BMI 25.94 kg/m2  SpO2 97%  Body mass index is 25.94 kg/(m^2).   No  pedal edema  ASSESSMENT/ PLAN:   Diabetes type 2   The patient's diabetes control is excellent although she is having relatively high readings after her evening meal Her diet was reviewed and appears to be recently good especially in the evenings She is getting some help drawing her insulin up at mealtimes and she thinks this is accurate With reducing her morning Levemir her evening readings are relatively higher but not clear if she is high before supper also Discussed timing of glucose monitoring to be rotated before and after various meals  Recommended the following:  For now will increase her suppertime regular insulin dose by 2 units  Reduce Levemir by 3 units in the evening  She will call if she is having any low blood sugars  Also to call if she has high readings before supper  Hypertension: Good control     Patient Instructions  LEVEMIR INSULIN 40 IN AM AND 12 IN PM   REGULAR INSULIN 5-7 UNITS IN AM  AND PM REGULAR = 17  Please check blood sugars at least half the time about 2  hours after any meal and 3-4 times per week on waking up. Please bring blood sugar monitor to each visit      Adithi Gammon 12/02/2013, 10:53 AM

## 2013-12-03 NOTE — Progress Notes (Signed)
Patient ID: Julie Brewer, female   DOB: 31-Aug-1950, 63 y.o.   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

## 2013-12-11 ENCOUNTER — Other Ambulatory Visit: Payer: Self-pay | Admitting: Endocrinology

## 2013-12-20 ENCOUNTER — Other Ambulatory Visit: Payer: Self-pay | Admitting: Endocrinology

## 2013-12-29 ENCOUNTER — Other Ambulatory Visit: Payer: Self-pay | Admitting: Endocrinology

## 2014-02-17 ENCOUNTER — Ambulatory Visit (INDEPENDENT_AMBULATORY_CARE_PROVIDER_SITE_OTHER): Payer: Medicare HMO | Admitting: Podiatry

## 2014-02-17 ENCOUNTER — Encounter: Payer: Self-pay | Admitting: Podiatry

## 2014-02-17 DIAGNOSIS — B351 Tinea unguium: Secondary | ICD-10-CM

## 2014-02-17 DIAGNOSIS — I739 Peripheral vascular disease, unspecified: Secondary | ICD-10-CM

## 2014-02-17 DIAGNOSIS — M79676 Pain in unspecified toe(s): Secondary | ICD-10-CM

## 2014-02-17 DIAGNOSIS — Q828 Other specified congenital malformations of skin: Secondary | ICD-10-CM

## 2014-02-17 NOTE — Progress Notes (Signed)
Patient ID: Julie Brewer, female   DOB: 1950-04-26, 64 y.o.   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

## 2014-03-02 ENCOUNTER — Other Ambulatory Visit (INDEPENDENT_AMBULATORY_CARE_PROVIDER_SITE_OTHER): Payer: Medicare HMO

## 2014-03-02 DIAGNOSIS — IMO0002 Reserved for concepts with insufficient information to code with codable children: Secondary | ICD-10-CM

## 2014-03-02 DIAGNOSIS — E1165 Type 2 diabetes mellitus with hyperglycemia: Secondary | ICD-10-CM

## 2014-03-02 LAB — BASIC METABOLIC PANEL
BUN: 19 mg/dL (ref 6–23)
CALCIUM: 9.3 mg/dL (ref 8.4–10.5)
CO2: 28 mEq/L (ref 19–32)
Chloride: 106 mEq/L (ref 96–112)
Creatinine, Ser: 1.01 mg/dL (ref 0.40–1.20)
GFR: 71.57 mL/min (ref >60.00)
Glucose, Bld: 80 mg/dL (ref 70–99)
POTASSIUM: 4.2 meq/L (ref 3.5–5.1)
Sodium: 138 mEq/L (ref 135–145)

## 2014-03-02 LAB — HEMOGLOBIN A1C: Hgb A1c MFr Bld: 6 % (ref 4.6–6.5)

## 2014-03-05 ENCOUNTER — Other Ambulatory Visit: Payer: Self-pay | Admitting: *Deleted

## 2014-03-05 ENCOUNTER — Ambulatory Visit (INDEPENDENT_AMBULATORY_CARE_PROVIDER_SITE_OTHER): Payer: Medicare HMO | Admitting: Endocrinology

## 2014-03-05 ENCOUNTER — Encounter: Payer: Self-pay | Admitting: Endocrinology

## 2014-03-05 VITALS — BP 159/75 | HR 87 | Temp 98.2°F | Resp 14 | Ht 63.0 in | Wt 145.8 lb

## 2014-03-05 DIAGNOSIS — IMO0002 Reserved for concepts with insufficient information to code with codable children: Secondary | ICD-10-CM

## 2014-03-05 DIAGNOSIS — E559 Vitamin D deficiency, unspecified: Secondary | ICD-10-CM

## 2014-03-05 DIAGNOSIS — E1165 Type 2 diabetes mellitus with hyperglycemia: Secondary | ICD-10-CM

## 2014-03-05 DIAGNOSIS — E1139 Type 2 diabetes mellitus with other diabetic ophthalmic complication: Secondary | ICD-10-CM

## 2014-03-05 DIAGNOSIS — H547 Unspecified visual loss: Secondary | ICD-10-CM

## 2014-03-05 DIAGNOSIS — H540 Blindness, both eyes: Secondary | ICD-10-CM

## 2014-03-05 DIAGNOSIS — K3184 Gastroparesis: Secondary | ICD-10-CM

## 2014-03-05 DIAGNOSIS — I10 Essential (primary) hypertension: Secondary | ICD-10-CM

## 2014-03-05 DIAGNOSIS — E1143 Type 2 diabetes mellitus with diabetic autonomic (poly)neuropathy: Secondary | ICD-10-CM

## 2014-03-05 MED ORDER — GLUCOSE BLOOD VI STRP: ORAL_STRIP | Status: DC

## 2014-03-05 NOTE — Progress Notes (Signed)
Patient ID: Julie Brewer, female   DOB: 04-Mar-1950, 64 y.o.   MRN: [id number]   Reason for Appointment: Diabetes follow-up   History of Present Illness   Diagnosis: Type 2 DIABETES MELITUS, date of diagnosis:  1985  Prior history: She has been on insulin since diagnosis and on Lantus previously Also at some point had been started on Glucophage several years ago also Because of insurance preference Lantus was changed to Levemir and is using this twice a day She refuses to use analog rapid acting insulin because of cost and is using regular insulin for several years   Her blood sugars are generally well controlled and A1c usually under 7%  Recent history:  Insulin regimen: Levemir insulin 40 in the morning and 12 in the evening. Regular insulin 5-7 units a.m. and 17 before supper  Uses syringes          Her A1c has improved now compared to her previous level of 6.8 She says she is living with her recently and is unable to check her sugar as often because of her grandchildren being around Current blood sugar patterns and problems:  She had an episode of hypoglycemia that woke her up at night about a week ago and apparently glucose was 23.  No other episodes of hypoglycemia during the day or night.  This is despite reducing her evening Levemir on her last visit  She has 2 readings over 300 at night.  She is drinking sweet drinks with evening meal sometimes in the form of Hawaiian punch.  Does not know why her blood sugars are been high those days  She is fairly compliant with taking her insulin about 30 with before eating  Has only one fasting blood sugar for review on her monitor which was 100   Hypoglycemia: at 3 am once as above Apparently does not have any symptoms of hypoglycemia sometimes when blood sugars have been low  She does take 2 units more of regular insulin before eating cereal in the morning   Oral hypoglycemic drugs: Metformin ER        Side effects from  medications: None Proper timing of medications in relation to meals: Yes, 30 min ac.         Monitors blood glucose: About 2 times a day.    Glucometer:  FreeStyle     Blood Glucose readings from meter download are very sparse, as above  Meals:  usually 2 meals per day at 10 AM and 5 PM.  protein sources: Egg, [place], chicken. Sweet drinks at times        Physical activity: exercise: Just walking within the house              Microalbumin has been persistently high previously, was normal at 26 in 9/15  Wt Readings from Last 3 Encounters:  03/05/14 145 lb 12.8 oz (66.134 kg)  12/02/13 146 lb 6.4 oz (66.407 kg)  10/02/13 147 lb (66.679 kg)   Lab Results  Component Value Date   HGBA1C 6.0 03/02/2014   HGBA1C 6.8* 09/30/2013   HGBA1C 6.5 06/29/2013   Lab Results  Component Value Date   MICROALBUR 41.0* 09/30/2013   LDLCALC 55 06/29/2013   CREATININE 1.01 03/02/2014     Appointment on 03/02/2014  Component Date Value Ref Range Status  . Hgb A1c MFr Bld 03/02/2014 6.0  4.6 - 6.5 % Final   Glycemic Control Guidelines for People with Diabetes:Non Diabetic:  <6%Goal of Therapy: <7%Additional Action  Suggested:  >8%   . Sodium 03/02/2014 138  135 - 145 mEq/L Final  . Potassium 03/02/2014 4.2  3.5 - 5.1 mEq/L Final  . Chloride 03/02/2014 106  96 - 112 mEq/L Final  . CO2 03/02/2014 28  19 - 32 mEq/L Final  . Glucose, Bld 03/02/2014 80  70 - 99 mg/dL Final  . BUN 03/02/2014 19  6 - 23 mg/dL Final  . Creatinine, Ser 03/02/2014 1.01  0.40 - 1.20 mg/dL Final  . Calcium 03/02/2014 9.3  8.4 - 10.5 mg/dL Final  . GFR 03/02/2014 71.57  >60.00 mL/min Final      Medication List       This list is accurate as of: 03/05/14 11:59 PM.  Always use your most recent med list.               aspirin 325 MG EC tablet  Take 325 mg by mouth daily.     cilostazol 100 MG tablet  Commonly known as:  PLETAL  TAKE ONE TABLET BY MOUTH TWICE DAILY     cloNIDine 0.1 MG tablet  Commonly known as:   CATAPRES  TAKE ONE TABLET BY MOUTH TWICE DAILY     gabapentin 100 MG capsule  Commonly known as:  NEURONTIN  TAKE ONE CAPSULE BY MOUTH TWICE DAILY     glucose blood test strip  Commonly known as:  FREESTYLE LITE  Use as instructed to check blood sugar 2 times a day dx code E11.9     insulin detemir 100 UNIT/ML injection  Commonly known as:  LEVEMIR  Inject 40 units in am and 12 in pm dx code 250.00     insulin regular 100 units/mL injection  Commonly known as:  NOVOLIN R,HUMULIN R  Inject into the skin 2 (two) times daily before a meal. 6 in am and 17 at supper     lisinopril 5 MG tablet  Commonly known as:  PRINIVIL,ZESTRIL  TAKE ONE TABLET BY MOUTH ONCE DAILY     metFORMIN 750 MG 24 hr tablet  Commonly known as:  GLUCOPHAGE-XR  TAKE ONE TABLET BY MOUTH ONCE DAILY     metoCLOPramide 10 MG tablet  Commonly known as:  REGLAN  Take 1 tablet (10 mg total) by mouth 4 (four) times daily.     omeprazole 20 MG capsule  Commonly known as:  PRILOSEC  TAKE ONE CAPSULE BY MOUTH ONCE DAILY (DUE  FOR  OFFICE  VISIT  THIS  MONTH)     simvastatin 40 MG tablet  Commonly known as:  ZOCOR  TAKE ONE TABLET BY MOUTH ONCE DAILY EVERY EVENING     Vitamin D (Ergocalciferol) 50000 UNITS Caps capsule  Commonly known as:  DRISDOL  TAKE ONE CAPSULE BY MOUTH EVERY 7 DAYS        Allergies:  Allergies  Allergen Reactions  . Morphine And Related Other (See Comments)    Hallucenations   . Penicillins Rash    Swelling    Past Medical History  Diagnosis Date  . Hypertension   . Hyperlipidemia   . Diabetes mellitus   . Peripheral vascular disease   . Stroke   . Glaucoma   . 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 Refractive surgery      both eyes  . Back surgery      X2  . Abdominal  hysterectomy    . Colonoscopy  July 09, 2012  . Spine surgery      Family History  Problem  Relation Age of Onset  . Cancer Mother   . Heart disease Mother   . Diabetes Father   . Cancer Brother   . Cancer Brother     Social History:  reports that she has been smoking Cigarettes.  She has been smoking about 1.00 pack per day. She has never used smokeless tobacco. She reports that she does not drink alcohol or use illicit drugs.  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  Has been refusing to go to the eye doctor because of the cost   HYPERTENSION:  now taking clonidine and lisinopril low dose.  Blood pressure was high on her initial measurement   HYPERLIPIDEMIA: The lipid abnormality consists of elevated LDL controlled with simvastatin. Apparently baseline LDL was 202  Lab Results  Component Value Date   CHOL 131 06/29/2013   HDL 52.90 06/29/2013   LDLCALC 55 06/29/2013   TRIG 117.0 06/29/2013   CHOLHDL 2 06/29/2013    Has had complaints of her legs burning and stinging, some pain on walking in her feet; feels  better with using gabapentin 2 times daily, unable to sleep at night previously  She was started on Reglan by her PCP several years ago; she thinks she was having problems with vomiting at that time. She takes this 3 times a day ac without side effects and it does help her reflux symptoms, has occasional nausea  Vitamin D: Taking her 50,000 unit dosage once a month since level was high with weekly dose 1/30     Examination:   BP 159/75 mmHg  Pulse 87  Temp(Src) 98.2 F (36.8 C)  Resp 14  Ht 5\' 3"  (1.6 m)  Wt 145 lb 12.8 oz (66.134 kg)  BMI 25.83 kg/m2  SpO2 97%  Body mass index is 25.83 kg/(m^2).   Repeat blood pressure 138/64 Pulse regular, repeat 80  No pedal edema  ASSESSMENT/ PLAN:   Diabetes type 2   The patient's diabetes control is good with A1c in the upper normal range However she has not checked her sugar much recently because of her home environment and difficult  to know if she has any consistent patterns. As discussed in history of present illness she has had one episode of nocturnal hypoglycemia for unknown reason Also has sporadic very high readings possibly from drinking sweetened drinks. Not checking blood sugars after breakfast Discussed importance of checking blood sugars at various times especially with her history of hypoglycemia unawareness  Recommended the following:  For now will continue the mealtime insulin unchanged  increase her frequency of glucose monitoring as discussed above  Reduce Levemir by another 2 units in the evening  She will call if she is having any low blood sugars  Also to call if she has high readings after meals even with improving her diet and avoiding drinks with sugar and excessive carbohydrate  Hypertension: Good control  Microalbuminuria: Not present on her last visit, will need to repeat on the next visit  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 Please bring blood sugar monitor to each visit. Recommended blood sugar levels about 2 hours after meal is 140-180 and on waking up 90-130 NO HAWAIIN PUNCH  Please see eye Doctor   Counseling time over 50% of today's 25 minute visit  Arisbeth Purrington 03/06/2014, 4:25 PM

## 2014-03-05 NOTE — Patient Instructions (Addendum)
LEVEMIR 10 UNITS IN PM  Please check blood sugars at least half the time about 2 hours after any meal and 3 times per week on waking up. Please bring blood sugar monitor to each visit. Recommended blood sugar levels about 2 hours after meal is 140-180 and on waking up 90-130 NO HAWAIIN PUNCH  Please see eye Doctor

## 2014-03-15 ENCOUNTER — Other Ambulatory Visit: Payer: Self-pay | Admitting: Endocrinology

## 2014-03-16 NOTE — Telephone Encounter (Signed)
Please advise if ok to refill during Dr. Ronnie Derby absence. Thanks!

## 2014-03-17 ENCOUNTER — Telehealth: Payer: Self-pay | Admitting: Endocrinology

## 2014-03-17 ENCOUNTER — Other Ambulatory Visit: Payer: Self-pay | Admitting: Endocrinology

## 2014-03-17 NOTE — Telephone Encounter (Signed)
Rx for medication listed below and rx for metformin sent to pt's pharmacy.

## 2014-03-17 NOTE — Telephone Encounter (Signed)
Patient need refill for Metoclopramide 10 mg

## 2014-04-07 ENCOUNTER — Encounter: Payer: Self-pay | Admitting: Endocrinology

## 2014-04-07 ENCOUNTER — Ambulatory Visit (INDEPENDENT_AMBULATORY_CARE_PROVIDER_SITE_OTHER): Payer: No Typology Code available for payment source | Admitting: Endocrinology

## 2014-04-07 VITALS — BP 133/77 | HR 94 | Temp 98.2°F | Resp 14 | Ht 63.0 in | Wt 144.2 lb

## 2014-04-07 DIAGNOSIS — E11649 Type 2 diabetes mellitus with hypoglycemia without coma: Secondary | ICD-10-CM

## 2014-04-07 DIAGNOSIS — IMO0002 Reserved for concepts with insufficient information to code with codable children: Secondary | ICD-10-CM

## 2014-04-07 DIAGNOSIS — R5383 Other fatigue: Secondary | ICD-10-CM

## 2014-04-07 DIAGNOSIS — E1165 Type 2 diabetes mellitus with hyperglycemia: Secondary | ICD-10-CM

## 2014-04-07 LAB — TSH: TSH: 0.71 u[IU]/mL (ref 0.35–4.50)

## 2014-04-07 LAB — T4, FREE: FREE T4: 0.72 ng/dL (ref 0.60–1.60)

## 2014-04-07 NOTE — Patient Instructions (Addendum)
Levemir insulin 35 in the morning and none in the evening. Regular insulin 3-5 units a.m. and 10 before supper   Please check blood sugars at least half the time about 2 hours after any meal and 3 times per week on waking up. Please bring blood sugar monitor to each visit. Recommended blood sugar levels about 2 hours after meal is 130-160 and on waking up 80-130

## 2014-04-07 NOTE — Progress Notes (Signed)
Patient ID: Julie Brewer, female   DOB: 07-21-1950, 64 y.o.   MRN: [id number]   Reason for Appointment: Low blood sugars  History of Present Illness   Diagnosis: Type 2 DIABETES MELITUS, date of diagnosis:  1985  Prior history: She has been on insulin since diagnosis and on Lantus previously Also at some point had been started on Glucophage several years ago also Because of insurance preference Lantus was changed to Levemir and is using this twice a day She refuses to use analog rapid acting insulin because of cost and is using regular insulin for several years   Her blood sugars are generally well controlled and A1c usually under 7%  Recent history:  Insulin regimen: Levemir insulin 40 in the morning and 10 in the evening. Regular insulin 5-7 units a.m. and 17 before supper   She came in for her next visit because of her excessive low blood sugars which started roughly 3 weeks ago She is having low blood sugars at all different times although recently not during the night.  She does not always check her blood sugar when it is low but has several documented low blood sugars in the last 5 days She does have nonspecific symptoms when her blood sugar is low Also most recently her blood sugars have been usually below 140 at any time even though she was having some readings over 200 in the evenings last month Her average blood sugar overall for the last 28 days is 111  She does not know why her blood sugars are low as she thinks she is eating about the same and no change in her meal planning However she was told to stop drinking Hawaiian punch which she was doing on the last visit Her insulin was reduced somewhat on the last visit; she continues to be on low dose metformin ER  Oral hypoglycemic drugs: Metformin ER 750 at supper       Side effects from medications: None Proper timing of medications in relation to meals: Yes, 30 min ac.         Monitors blood glucose: About 2  times a day.    Glucometer:  FreeStyle     Blood Glucose readings from meter download recently:  PRE-MEAL Breakfast  PCB   afternoon   PCS  Overall  Glucose range:  60-93   77-138   40-79   32-135    Mean/median:        Meals:  usually 2 meals per day at 10 AM and 5 PM.  protein sources: Egg, [place], chicken. Sweet drinks at times        Physical activity: exercise: Just walking within the house              Microalbumin has been persistently high previously, was normal at 26 in 9/15  Wt Readings from Last 3 Encounters:  04/07/14 144 lb 3.2 oz (65.409 kg)  03/05/14 145 lb 12.8 oz (66.134 kg)  12/02/13 146 lb 6.4 oz (66.407 kg)   Lab Results  Component Value Date   HGBA1C 6.0 03/02/2014   HGBA1C 6.8* 09/30/2013   HGBA1C 6.5 06/29/2013   Lab Results  Component Value Date   MICROALBUR 41.0* 09/30/2013   LDLCALC 55 06/29/2013   CREATININE 1.01 03/02/2014     Office Visit on 04/07/2014  Component Date Value Ref Range Status  . Free T4 04/07/2014 0.72  0.60 - 1.60 ng/dL Final  . TSH 04/07/2014 0.71  0.35 -  4.50 uIU/mL Final      Medication List       This list is accurate as of: 04/07/14 11:59 PM.  Always use your most recent med list.               aspirin 325 MG EC tablet  Take 325 mg by mouth daily.     cilostazol 100 MG tablet  Commonly known as:  PLETAL  TAKE ONE TABLET BY MOUTH TWICE DAILY     cloNIDine 0.1 MG tablet  Commonly known as:  CATAPRES  TAKE ONE TABLET BY MOUTH TWICE DAILY     gabapentin 100 MG capsule  Commonly known as:  NEURONTIN  TAKE ONE CAPSULE BY MOUTH TWICE DAILY     glucose blood test strip  Commonly known as:  FREESTYLE LITE  Use as instructed to check blood sugar 2 times a day dx code E11.9     insulin detemir 100 UNIT/ML injection  Commonly known as:  LEVEMIR  Inject 40 units in am and 10 in pm dx code 250.00     insulin regular 100 units/mL injection  Commonly known as:  NOVOLIN R,HUMULIN R  Inject into the skin 2 (two)  times daily before a meal. 5 in am and 17 at supper     lisinopril 5 MG tablet  Commonly known as:  PRINIVIL,ZESTRIL  TAKE ONE TABLET BY MOUTH ONCE DAILY     metFORMIN 750 MG 24 hr tablet  Commonly known as:  GLUCOPHAGE-XR  TAKE ONE TABLET BY MOUTH ONCE DAILY     metoCLOPramide 10 MG tablet  Commonly known as:  REGLAN  TAKE ONE TABLET BY MOUTH 4 TIMES DAILY     omeprazole 20 MG capsule  Commonly known as:  PRILOSEC  TAKE ONE CAPSULE BY MOUTH ONCE DAILY (DUE  FOR  OFFICE  VISIT  THIS  MONTH)     simvastatin 40 MG tablet  Commonly known as:  ZOCOR  TAKE ONE TABLET BY MOUTH ONCE DAILY EVERY EVENING     Vitamin D (Ergocalciferol) 50000 UNITS Caps capsule  Commonly known as:  DRISDOL  TAKE ONE CAPSULE BY MOUTH EVERY 7 DAYS        Allergies:  Allergies  Allergen Reactions  . Morphine And Related Other (See Comments)    Hallucenations   . Penicillins Rash    Swelling    Past Medical History  Diagnosis Date  . Hypertension   . Hyperlipidemia   . Diabetes mellitus   . Peripheral vascular disease   . Stroke   . Glaucoma   . 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 Refractive surgery      both eyes  . Back surgery      X2  . Abdominal hysterectomy    . Colonoscopy  July 09, 2012  . Spine surgery      Family History  Problem Relation Age of Onset  . Cancer Mother   . Heart disease Mother   . Diabetes Father   . Cancer Brother   . Cancer Brother     Social History:  reports that she has been smoking Cigarettes.  She has been smoking about 1.00 pack per day. She has never used smokeless tobacco. She reports that she does not drink alcohol or use illicit drugs.  Review of Systems:  Blindness: She has absent vision  on the right side and can see silhouettes only on the left.  Has been refusing to go to the eye doctor because of the cost   HYPERTENSION:    taking clonidine and lisinopril low dose.  Blood pressure was high on her initial measurement   HYPERLIPIDEMIA: The lipid abnormality consists of elevated LDL controlled with simvastatin.  Her baseline LDL was 202  Lab Results  Component Value Date   CHOL 131 06/29/2013   HDL 52.90 06/29/2013   LDLCALC 55 06/29/2013   TRIG 117.0 06/29/2013   CHOLHDL 2 06/29/2013    Has had chronic late insomnia, no recent cold intolerance or unusual fatigue.  Vitamin D: Taking her 50,000 unit dosage once a month since level was high with weekly dose      Examination:   BP 133/77 mmHg  Pulse 94  Temp(Src) 98.2 F (36.8 C)  Resp 14  Ht 5\' 3"  (1.6 m)  Wt 144 lb 3.2 oz (65.409 kg)  BMI 25.55 kg/m2  SpO2 96%  Body mass index is 25.55 kg/(m^2).   Thyroid not palpable. Biceps reflexes appear normal No pedal edema  ASSESSMENT/ PLAN:   Diabetes type 2   The patient's blood sugars are getting significantly lower especially in the last 2 weeks and they appear to be low throughout the day; although she has not documented overnight low blood sugars she tends to have low normal or low fasting readings  She did not have any change in diet in the last month or so to explain her hypoglycemia She is usually taking her insulin accurately with help of the family and measuring it on a syringe He does not appear to have any reason for having renal insufficiency and does not look hypothyroid  Recommended the following:  For now will reduce her insulin significantly including stopping Levemir at bedtime  Levemir insulin 35 in the morning and none in the evening. Regular insulin 3-5 units a.m. and 10 before supper   Call if blood sugar continues to be low  Check renal function, hepatic function and TSH    Patient Instructions  Levemir insulin 35 in the morning and none in the evening. Regular insulin 3-5 units a.m. and 10 before supper   Please check blood sugars at least half the time about 2 hours  after any meal and 3 times per week on waking up. Please bring blood sugar monitor to each visit. Recommended blood sugar levels about 2 hours after meal is 130-160 and on waking up 80-130     Jull Harral 04/09/2014, 1:06 PM

## 2014-04-10 ENCOUNTER — Other Ambulatory Visit: Payer: Self-pay | Admitting: Endocrinology

## 2014-04-14 ENCOUNTER — Other Ambulatory Visit: Payer: Self-pay | Admitting: Gastroenterology

## 2014-04-14 DIAGNOSIS — R1310 Dysphagia, unspecified: Secondary | ICD-10-CM

## 2014-04-20 ENCOUNTER — Other Ambulatory Visit (INDEPENDENT_AMBULATORY_CARE_PROVIDER_SITE_OTHER): Payer: Medicare HMO

## 2014-04-20 ENCOUNTER — Other Ambulatory Visit: Payer: Self-pay | Admitting: *Deleted

## 2014-04-20 ENCOUNTER — Other Ambulatory Visit: Payer: Self-pay | Admitting: Endocrinology

## 2014-04-20 DIAGNOSIS — E119 Type 2 diabetes mellitus without complications: Secondary | ICD-10-CM

## 2014-04-20 LAB — COMPREHENSIVE METABOLIC PANEL
ALBUMIN: 3.5 g/dL (ref 3.5–5.2)
ALT: 11 U/L (ref 0–35)
AST: 14 U/L (ref 0–37)
Alkaline Phosphatase: 64 U/L (ref 39–117)
BUN: 12 mg/dL (ref 6–23)
CO2: 27 meq/L (ref 19–32)
Calcium: 9.2 mg/dL (ref 8.4–10.5)
Chloride: 105 mEq/L (ref 96–112)
Creatinine, Ser: 0.95 mg/dL (ref 0.40–1.20)
GFR: 76.77 mL/min (ref >60.00)
GLUCOSE: 175 mg/dL — AB (ref 70–99)
POTASSIUM: 4.4 meq/L (ref 3.5–5.1)
Sodium: 136 mEq/L (ref 135–145)
Total Bilirubin: 0.2 mg/dL (ref 0.2–1.2)
Total Protein: 6.6 g/dL (ref 6.0–8.3)

## 2014-04-21 ENCOUNTER — Encounter: Payer: Self-pay | Admitting: Podiatry

## 2014-04-21 ENCOUNTER — Ambulatory Visit (INDEPENDENT_AMBULATORY_CARE_PROVIDER_SITE_OTHER): Payer: Medicare Other | Admitting: Podiatry

## 2014-04-21 DIAGNOSIS — Q828 Other specified congenital malformations of skin: Secondary | ICD-10-CM

## 2014-04-21 DIAGNOSIS — E1151 Type 2 diabetes mellitus with diabetic peripheral angiopathy without gangrene: Secondary | ICD-10-CM

## 2014-04-21 DIAGNOSIS — M79676 Pain in unspecified toe(s): Secondary | ICD-10-CM | POA: Diagnosis not present

## 2014-04-21 DIAGNOSIS — B351 Tinea unguium: Secondary | ICD-10-CM | POA: Diagnosis not present

## 2014-04-21 NOTE — Progress Notes (Signed)
Patient ID: Julie Brewer, female   DOB: Dec 05, 1950, 64 y.o.   MRN: [id number] Subjective: This note diabetic patient with a history of peripheral arterial disease presents complaining of painful toenails and painful plantar calluses and requests nail debridement Denies history of foot ulceration  Objective Visually impaired DP pulse 1/4 bilaterally PT pulse 1/4 bilaterally  Neurological: Ankle reflexes equal and reactive bilaterally Vibratory sensation reactive bilaterally Sensation to 10 g monofilament wire intact 5/5 right and 4/5 left  Dermatological: Atrophic skin without any hair growth The toenails are elongated brittle, incurvated, hypertrophic and tender to direct palpation : Plantar callus with nucleated area second MPJ right and nonnucleated area second MPJ left Plantar callus fifth right MPJ  Musculoskeletal: Hammertoe second bilaterally  Assessment: Diminished pedal pulses consistent with known history of diabetic peripheral arterial disease Symptomatic onychomycoses 6-10 Porokeratosis and keratoses 3  History of diabetes with associated neurological manifestations  Plan: Debridement of toenails 10 and keratoses 3 without any bleeding  Reappoint at three-month intervals

## 2014-04-21 NOTE — Patient Instructions (Signed)
Diabetes and Foot Care Diabetes may cause you to have problems because of poor blood supply (circulation) to your feet and legs. This may cause the skin on your feet to become thinner, break easier, and heal more slowly. Your skin may become dry, and the skin may peel and crack. You may also have nerve damage in your legs and feet causing decreased feeling in them. You may not notice minor injuries to your feet that could lead to infections or more serious problems. Taking care of your feet is one of the most important things you can do for yourself.  HOME CARE INSTRUCTIONS  Wear shoes at all times, even in the house. Do not go barefoot. Bare feet are easily injured.  Check your feet daily for blisters, cuts, and redness. If you cannot see the bottom of your feet, use a mirror or ask someone for help.  Wash your feet with warm water (do not use hot water) and mild soap. Then pat your feet and the areas between your toes until they are completely dry. Do not soak your feet as this can dry your skin.  Apply a moisturizing lotion or petroleum jelly (that does not contain alcohol and is unscented) to the skin on your feet and to dry, brittle toenails. Do not apply lotion between your toes.  Trim your toenails straight across. Do not dig under them or around the cuticle. File the edges of your nails with an emery board or nail file.  Do not cut corns or calluses or try to remove them with medicine.  Wear clean socks or stockings every day. Make sure they are not too tight. Do not wear knee-high stockings since they may decrease blood flow to your legs.  Wear shoes that fit properly and have enough cushioning. To break in new shoes, wear them for just a few hours a day. This prevents you from injuring your feet. Always look in your shoes before you put them on to be sure there are no objects inside.  Do not cross your legs. This may decrease the blood flow to your feet.  If you find a minor scrape,  cut, or break in the skin on your feet, keep it and the skin around it clean and dry. These areas may be cleansed with mild soap and water. Do not cleanse the area with peroxide, alcohol, or iodine.  When you remove an adhesive bandage, be sure not to damage the skin around it.  If you have a wound, look at it several times a day to make sure it is healing.  Do not use heating pads or hot water bottles. They may burn your skin. If you have lost feeling in your feet or legs, you may not know it is happening until it is too late.  Make sure your health care provider performs a complete foot exam at least annually or more often if you have foot problems. Report any cuts, sores, or bruises to your health care provider immediately. SEEK MEDICAL CARE IF:   You have an injury that is not healing.  You have cuts or breaks in the skin.  You have an ingrown nail.  You notice redness on your legs or feet.  You feel burning or tingling in your legs or feet.  You have pain or cramps in your legs and feet.  Your legs or feet are numb.  Your feet always feel cold. SEEK IMMEDIATE MEDICAL CARE IF:   There is increasing redness,   swelling, or pain in or around a wound.  There is a red line that goes up your leg.  Pus is coming from a wound.  You develop a fever or as directed by your health care provider.  You notice a bad smell coming from an ulcer or wound. Document Released: 01/13/2000 Document Revised: 09/17/2012 Document Reviewed: 06/24/2012 [employer] Patient Information 2015 [employer]. This information is not intended to replace advice given to you by your health care provider. Make sure you discuss any questions you have with your health care provider.  

## 2014-04-27 ENCOUNTER — Ambulatory Visit
Admission: RE | Admit: 2014-04-27 | Discharge: 2014-04-27 | Disposition: A | Discharge status: Home / Self Care | Payer: Medicare Other | Source: Ambulatory Visit | Attending: Gastroenterology | Admitting: Gastroenterology

## 2014-04-27 DIAGNOSIS — R1310 Dysphagia, unspecified: Secondary | ICD-10-CM

## 2014-04-28 ENCOUNTER — Other Ambulatory Visit: Payer: Self-pay | Admitting: Endocrinology

## 2014-05-05 ENCOUNTER — Ambulatory Visit (INDEPENDENT_AMBULATORY_CARE_PROVIDER_SITE_OTHER): Payer: Medicare Other | Admitting: Endocrinology

## 2014-05-05 ENCOUNTER — Encounter: Payer: Self-pay | Admitting: Endocrinology

## 2014-05-05 VITALS — BP 124/68 | HR 90 | Temp 97.9°F | Wt 146.6 lb

## 2014-05-05 DIAGNOSIS — E049 Nontoxic goiter, unspecified: Secondary | ICD-10-CM

## 2014-05-05 DIAGNOSIS — R1314 Dysphagia, pharyngoesophageal phase: Secondary | ICD-10-CM | POA: Insufficient documentation

## 2014-05-05 NOTE — Progress Notes (Signed)
Subjective:      Patient ID: Julie Brewer, female   DOB: 17-Sep-1950, 64 y.o.   MRN: [id number]  HPI  Chief complaint: Difficulty swallowing  The patient said that she has had difficulty swallowing since about 12/2013 She has to keep her food bites small and can only swallow soft foods or liquids without difficulty Otherwise she has to chop up her meats and hard vegetables otherwise she will choke.  Her daughter says that she will choke on oatmeal also  She also thinks she was having some swelling in her jaw area and upper neck and went to the gastroenterologist  She was studied with a barium swallow and this showed a possible slight indentation of the lower cervical esophagus from a goiter and she is here for further evaluation; her thyroid functions are normal    Review of Systems  She says her blood sugars are not getting as low as they were before and she is due for follow-up next month for her diabetes     Objective:   Physical Exam    BP 124/68 mmHg  Pulse 90  Temp(Src) 97.9 F (36.6 C) (Oral)  Wt 146 lb 9.6 oz (66.497 kg)  Thyroid is barely palpable on the left and just palpable about 1-1/2 times normal on the right side, firm and relatively low lying. No stridor      Assessment:     Possible goiter Most of the goiter may be intrathoracic as only a small amount of tissue is palpable in the neck  Discussed evaluation with radiologist and since thyroid is not clearly palpable he would recommend CT scan    Plan:     CT scan of neck will be ordered and further management depends on results. Discussed that if she has a very significant goiter may need surgical treatment

## 2014-05-05 NOTE — Progress Notes (Signed)
Pre visit review using our clinic review tool, if applicable. No additional management support is needed unless otherwise documented below in the visit note. 

## 2014-05-17 ENCOUNTER — Other Ambulatory Visit: Payer: Self-pay | Admitting: Surgery

## 2014-05-19 ENCOUNTER — Ambulatory Visit (INDEPENDENT_AMBULATORY_CARE_PROVIDER_SITE_OTHER)
Admission: RE | Admit: 2014-05-19 | Discharge: 2014-05-19 | Disposition: A | Discharge status: Home / Self Care | Payer: Medicare Other | Source: Ambulatory Visit | Attending: Endocrinology | Admitting: Endocrinology

## 2014-05-19 DIAGNOSIS — E049 Nontoxic goiter, unspecified: Secondary | ICD-10-CM | POA: Diagnosis not present

## 2014-05-19 DIAGNOSIS — R1314 Dysphagia, pharyngoesophageal phase: Secondary | ICD-10-CM | POA: Diagnosis not present

## 2014-05-19 MED ORDER — IOHEXOL 300 MG/ML  SOLN
76.0000 mL | Freq: Once | INTRAMUSCULAR | Status: AC | PRN
  Administered 2014-05-19: 76 mL via INTRAVENOUS

## 2014-05-24 ENCOUNTER — Other Ambulatory Visit: Payer: Self-pay | Admitting: Endocrinology

## 2014-05-27 ENCOUNTER — Telehealth: Payer: Self-pay | Admitting: Endocrinology

## 2014-05-27 NOTE — Telephone Encounter (Signed)
Please see below.

## 2014-05-27 NOTE — Telephone Encounter (Signed)
Patient daughter calling for patient CT scan results, Julie Brewer [phone]

## 2014-05-27 NOTE — Telephone Encounter (Signed)
She has a 1 inch cyst in the back of her throat but this has been present before.  There is nothing that's pressing on the food pipe and her thyroid not significantly enlarged.  She needs to talk to gastroenterologist again about swallowing problem

## 2014-05-28 NOTE — Telephone Encounter (Signed)
Noted, patients daughter is aware. 

## 2014-05-31 ENCOUNTER — Other Ambulatory Visit (INDEPENDENT_AMBULATORY_CARE_PROVIDER_SITE_OTHER): Payer: Medicare Other

## 2014-05-31 DIAGNOSIS — E1165 Type 2 diabetes mellitus with hyperglycemia: Secondary | ICD-10-CM

## 2014-05-31 DIAGNOSIS — IMO0002 Reserved for concepts with insufficient information to code with codable children: Secondary | ICD-10-CM

## 2014-05-31 DIAGNOSIS — E559 Vitamin D deficiency, unspecified: Secondary | ICD-10-CM

## 2014-05-31 LAB — MICROALBUMIN / CREATININE URINE RATIO
Creatinine,U: 271.4 mg/dL
MICROALB/CREAT RATIO: 35.3 mg/g — AB (ref 0.0–30.0)
Microalb, Ur: 95.8 mg/dL — ABNORMAL HIGH (ref 0.0–1.9)

## 2014-05-31 LAB — VITAMIN D 25 HYDROXY (VIT D DEFICIENCY, FRACTURES): VITD: 31.74 ng/mL (ref 30.00–100.00)

## 2014-05-31 LAB — HEMOGLOBIN A1C: Hgb A1c MFr Bld: 5.8 % (ref 4.6–6.5)

## 2014-06-01 LAB — URINALYSIS, ROUTINE W REFLEX MICROSCOPIC
Hgb urine dipstick: NEGATIVE
LEUKOCYTES UA: NEGATIVE
NITRITE: POSITIVE — AB
PH: 6 (ref 5.0–8.0)
Total Protein, Urine: >=300 — AB
UROBILINOGEN UA: 0.2 (ref 0.0–1.0)
Urine Glucose: NEGATIVE

## 2014-06-03 NOTE — H&P (Signed)
06/07/14 7:33 AM  Julie Brewer  PREOPERATIVE HISTORY AND PHYSICAL  CHIEF COMPLAINT: vallecular cyst  HISTORY: This is a 64 year old who presents with a vallecular cyst .  She now presents for direct laryngoscopy with excision.  Dr. Simeon Craft, [place] has discussed the risks (dental injury, bleeding, cyst recurrence, infection, airway problems, etc.), benefits, and alternatives of this procedure. The patient understands the risks and would like to proceed with the procedure. The chances of success of the procedure are >50% and the patient understands this. I personally performed an examination of the patient within 24 hours of the procedure.  PAST MEDICAL HISTORY: Past Medical History  Diagnosis Date  . Hypertension   . Hyperlipidemia   . Diabetes mellitus   . Peripheral vascular disease   . Stroke   . Glaucoma   . 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 Refractive surgery      both eyes  . Back surgery      X2  . Abdominal hysterectomy    . Colonoscopy  July 09, 2012  . Spine surgery      MEDICATIONS: No current facility-administered medications on file prior to encounter.   Current Outpatient Prescriptions on File Prior to Encounter  Medication Sig Dispense Refill  . aspirin 325 MG EC tablet Take 325 mg by mouth daily.    . cilostazol (PLETAL) 100 MG tablet TAKE ONE TABLET BY MOUTH TWICE DAILY 60 tablet 6  . cloNIDine (CATAPRES) 0.1 MG tablet TAKE ONE TABLET BY MOUTH TWICE DAILY 60 tablet 5  . gabapentin (NEURONTIN) 100 MG capsule TAKE ONE CAPSULE BY MOUTH TWICE DAILY 180 capsule 1  . glucose blood (FREESTYLE LITE) test strip Use as instructed to check blood sugar 2 times a day dx code E11.9 200 each 1  . insulin detemir (LEVEMIR) 100 UNIT/ML injection Inject 35 Units into the skin daily.     . insulin regular (NOVOLIN R,HUMULIN R) 100 units/mL  injection Inject 5-10 Units into the skin 2 (two) times daily before a meal. 5 in am and 10 at supper    . lisinopril (PRINIVIL,ZESTRIL) 5 MG tablet TAKE ONE TABLET BY MOUTH ONCE DAILY 90 tablet 1  . metFORMIN (GLUCOPHAGE-XR) 750 MG 24 hr tablet TAKE ONE TABLET BY MOUTH ONCE DAILY 90 tablet 0  . metoCLOPramide (REGLAN) 10 MG tablet TAKE ONE TABLET BY MOUTH 4 TIMES DAILY 120 tablet 0  . omeprazole (PRILOSEC) 20 MG capsule TAKE ONE CAPSULE BY MOUTH ONCE DAILY (DUE  FOR  OFFICE  VISIT  THIS  MONTH) 90 capsule 1  . simvastatin (ZOCOR) 40 MG tablet TAKE ONE TABLET BY MOUTH ONCE DAILY EVERY EVENING 90 tablet 1  . Vitamin D, Ergocalciferol, (DRISDOL) 50000 UNITS CAPS capsule TAKE ONE CAPSULE BY MOUTH EVERY 7 DAYS (Patient taking differently: TAKE ONE CAPSULE BY MOUTH EVERY 30 DAYS) 12 capsule 0    ALLERGIES: Allergies  Allergen Reactions  . Morphine And Related Other (See Comments)    Hallucenations   . Penicillins Rash    Swelling      SOCIAL HISTORY: History   Social History  . Marital Status: Legally Separated    Spouse Name: N/A  . Number of Children: N/A  . Years of Education: N/A   Occupational History  . Not on file.   Social History Main Topics  . Smoking status:  Current Every Day Smoker -- 1.00 packs/day    Types: Cigarettes  . Smokeless tobacco: Never Used  . Alcohol Use: No  . Drug Use: No  . Sexual Activity: Not on file   Other Topics Concern  . Not on file   Social History Narrative    FAMILY HISTORY:  Family History  Problem Relation Age of Onset  . Cancer Mother   . Heart disease Mother   . Diabetes Father   . Cancer Brother   . Cancer Brother     REVIEW OF SYSTEMS:  HEENT: dysphagia and globus sensation, otherwise negative x 12 systems except per HPI.  PHYSICAL EXAM:  GENERAL:  NAD SKIN:  Warm, dry HEENT: vision impared, oral cavity patent NECK:  supple LYMPH:  No palpable LAD ABDOMEN:  soft MUSCULOSKELETAL: normal strength PSYCH: normal  affect  NEUROLOGIC:  CN 2-12 intact  DIAGNOSTIC STUDIES:CT neck shows vallecular cyst  ASSESSMENT AND PLAN: Plan to proceed with direct laryngoscopy with excision of vallecular cyst. Patient understands the risks, benefits, and alternatives. Informed written consent signed witnessed and on chart.  Julie Brewer  06/07/2014  7:34 AM

## 2014-06-04 ENCOUNTER — Other Ambulatory Visit (HOSPITAL_COMMUNITY): Payer: Self-pay | Admitting: Otolaryngology

## 2014-06-04 ENCOUNTER — Encounter (HOSPITAL_COMMUNITY)
Admission: RE | Admit: 2014-06-04 | Discharge: 2014-06-04 | Disposition: A | Discharge status: Home / Self Care | Payer: Medicare Other | Source: Ambulatory Visit | Attending: Otolaryngology | Admitting: Otolaryngology

## 2014-06-04 ENCOUNTER — Encounter: Payer: Self-pay | Admitting: Endocrinology

## 2014-06-04 ENCOUNTER — Ambulatory Visit (INDEPENDENT_AMBULATORY_CARE_PROVIDER_SITE_OTHER): Payer: Medicare Other | Admitting: *Deleted

## 2014-06-04 ENCOUNTER — Encounter (HOSPITAL_COMMUNITY): Payer: Self-pay

## 2014-06-04 VITALS — BP 136/72 | HR 86 | Temp 97.9°F | Resp 16 | Ht 63.0 in | Wt 145.2 lb

## 2014-06-04 DIAGNOSIS — Z794 Long term (current) use of insulin: Secondary | ICD-10-CM | POA: Diagnosis not present

## 2014-06-04 DIAGNOSIS — H409 Unspecified glaucoma: Secondary | ICD-10-CM | POA: Diagnosis not present

## 2014-06-04 DIAGNOSIS — Z7982 Long term (current) use of aspirin: Secondary | ICD-10-CM | POA: Diagnosis not present

## 2014-06-04 DIAGNOSIS — E1165 Type 2 diabetes mellitus with hyperglycemia: Secondary | ICD-10-CM | POA: Diagnosis not present

## 2014-06-04 DIAGNOSIS — E119 Type 2 diabetes mellitus without complications: Secondary | ICD-10-CM | POA: Diagnosis not present

## 2014-06-04 DIAGNOSIS — I10 Essential (primary) hypertension: Secondary | ICD-10-CM

## 2014-06-04 DIAGNOSIS — IMO0002 Reserved for concepts with insufficient information to code with codable children: Secondary | ICD-10-CM

## 2014-06-04 DIAGNOSIS — Z7902 Long term (current) use of antithrombotics/antiplatelets: Secondary | ICD-10-CM | POA: Diagnosis not present

## 2014-06-04 DIAGNOSIS — H5441 Blindness, right eye, normal vision left eye: Secondary | ICD-10-CM | POA: Diagnosis not present

## 2014-06-04 DIAGNOSIS — I739 Peripheral vascular disease, unspecified: Secondary | ICD-10-CM | POA: Diagnosis not present

## 2014-06-04 DIAGNOSIS — J387 Other diseases of larynx: Secondary | ICD-10-CM | POA: Diagnosis present

## 2014-06-04 DIAGNOSIS — Z79899 Other long term (current) drug therapy: Secondary | ICD-10-CM | POA: Diagnosis not present

## 2014-06-04 DIAGNOSIS — F17210 Nicotine dependence, cigarettes, uncomplicated: Secondary | ICD-10-CM | POA: Diagnosis not present

## 2014-06-04 DIAGNOSIS — Z8673 Personal history of transient ischemic attack (TIA), and cerebral infarction without residual deficits: Secondary | ICD-10-CM | POA: Diagnosis not present

## 2014-06-04 DIAGNOSIS — E785 Hyperlipidemia, unspecified: Secondary | ICD-10-CM | POA: Diagnosis not present

## 2014-06-04 DIAGNOSIS — J370 Chronic laryngitis: Secondary | ICD-10-CM | POA: Diagnosis not present

## 2014-06-04 HISTORY — DX: Gastro-esophageal reflux disease without esophagitis: K21.9

## 2014-06-04 HISTORY — DX: Reserved for inherently not codable concepts without codable children: IMO0001

## 2014-06-04 HISTORY — DX: Unspecified asthma, uncomplicated: J45.909

## 2014-06-04 HISTORY — DX: Pneumonia, unspecified organism: J18.9

## 2014-06-04 LAB — CBC
HCT: 37.2 % (ref 36.0–46.0)
Hemoglobin: 11.8 g/dL — ABNORMAL LOW (ref 12.0–15.0)
MCH: 27 pg (ref 26.0–34.0)
MCHC: 31.7 g/dL (ref 30.0–36.0)
MCV: 85.1 fL (ref 78.0–100.0)
PLATELETS: 205 10*3/uL (ref 150–400)
RBC: 4.37 MIL/uL (ref 3.87–5.11)
RDW: 15.7 % — AB (ref 11.5–15.5)
WBC: 7.6 10*3/uL (ref 4.0–10.5)

## 2014-06-04 LAB — BASIC METABOLIC PANEL
ANION GAP: 7 (ref 5–15)
BUN: 13 mg/dL (ref 6–20)
CALCIUM: 9.5 mg/dL (ref 8.9–10.3)
CO2: 26 mmol/L (ref 22–32)
Chloride: 108 mmol/L (ref 101–111)
Creatinine, Ser: 0.98 mg/dL (ref 0.44–1.00)
Glucose, Bld: 189 mg/dL — ABNORMAL HIGH (ref 70–99)
Potassium: 4.2 mmol/L (ref 3.5–5.1)
Sodium: 141 mmol/L (ref 135–145)

## 2014-06-04 NOTE — Pre-Procedure Instructions (Signed)
Julie Brewer  06/04/2014   Your procedure is scheduled on:  Monday, May 9.   Report to [facility] Admitting at 7:10AM.   Call this number if you have problems the morning of surgery: [phone]    Remember:   Do not eat food or drink liquids after midnight Sunday.   Take these medicines the morning of surgery with A SIP OF WATER:cloNIDine (CATAPRES), gabapentin (NEURONTIN),  metoCLOPramide (REGLAN), omeprazole (PRILOSEC).               Stop Aspirin and Pletal    Do not wear jewelry, make-up or nail polish.  Do not wear lotions, powders, or perfumes  Do not shave 48 hours prior to surgery.              Do not bring valuables to the hospital.            [facility] is not responsible for any belongings or valuables.               Contacts, dentures or bridgework may not be worn into surgery.  Leave suitcase in the car. After surgery it may be brought to your room.  For patients admitted to the hospital, discharge time is determined by your treatment team.               Patients discharged the day of surgery will not be allowed to drive home.  Name and phone number of your driver: -   Special Instructions: Review  [facility] - Preparing For Surgery.   Please read over the following fact sheets that you were given: Pain Booklet, Coughing and Deep Breathing and Surgical Site Infection Prevention

## 2014-06-04 NOTE — Progress Notes (Signed)
Patient ID: Julie Brewer, female   DOB: 12/21/1950, 64 y.o.   MRN: [id number]   Reason for Appointment: Low blood sugars  History of Present Illness   Diagnosis: Type 2 DIABETES MELITUS, date of diagnosis:  1985  Prior history: She has been on insulin since diagnosis and on Lantus previously Also at some point had been started on Glucophage several years ago also Because of insurance preference Lantus was changed to Levemir and is using this twice a day She refuses to use analog rapid acting insulin because of cost and is using regular insulin for several years   Her blood sugars are generally well controlled and A1c usually under 7%  Recent history:  Insulin regimen: Levemir insulin 35 in the morning and none in the evening. Regular insulin 3-5 units a.m. and 10 before supper   She was having significant hypoglycemia prior to her visit in 3/16 Her insulin doses were reduced significantly and evening Levemir was stopped However she has not checked her blood sugars much lately and only some readings after supper Has had only one documented low blood sugars She does have nonspecific symptoms when her blood sugar is low and not consistently  she continues to be on low dose metformin ER Her A1c is still quite normal  Oral hypoglycemic drugs: Metformin ER 750 at supper       Side effects from medications: None Proper timing of medications in relation to meals: Yes, 30 min ac.          Monitors blood glucose: Less than once a day Glucometer:  FreeStyle     Blood Glucose readings from meter download recently show blood sugars only around 6-8 PM, range 52-173 with average 117  Meals:  usually 2 meals per day at 10 AM and 5 PM.  protein sources: Egg, [place], chicken. Sweet drinks consumed only occasionally  Physical activity: exercise: Just walking within the house              Microalbumin has been persistently high previously, was normal at 26 in 9/15  Wt Readings  from Last 3 Encounters:  06/04/14 145 lb 3.2 oz (65.862 kg)  05/05/14 146 lb 9.6 oz (66.497 kg)  04/07/14 144 lb 3.2 oz (65.409 kg)   Lab Results  Component Value Date   HGBA1C 5.8 05/31/2014   HGBA1C 6.0 03/02/2014   HGBA1C 6.8* 09/30/2013   Lab Results  Component Value Date   MICROALBUR 95.8* 05/31/2014   LDLCALC 55 06/29/2013   CREATININE 0.95 04/20/2014     Lab on 05/31/2014  Component Date Value Ref Range Status  . Hgb A1c MFr Bld 05/31/2014 5.8  4.6 - 6.5 % Final   Glycemic Control Guidelines for People with Diabetes:Non Diabetic:  <6%Goal of Therapy: <7%Additional Action Suggested:  >8%   . VITD 05/31/2014 31.74  30.00 - 100.00 ng/mL Final  . Microalb, Ur 05/31/2014 95.8* 0.0 - 1.9 mg/dL Final  . Creatinine,U 05/31/2014 271.4   Final  . Microalb Creat Ratio 05/31/2014 35.3* 0.0 - 30.0 mg/g Final  . Color, Urine 05/31/2014 YELLOW  Yellow;Lt. Yellow Final  . APPearance 05/31/2014 Cloudy* Clear Final  . Specific Gravity, Urine 05/31/2014 >=1.030* 1.000 - 1.030 Final  . pH 05/31/2014 6.0  5.0 - 8.0 Final  . Total Protein, Urine 05/31/2014 >=300* Negative Final  . Urine Glucose 05/31/2014 NEGATIVE  Negative Final  . Ketones, ur 05/31/2014 TRACE* Negative Final  . Bilirubin Urine 05/31/2014 SMALL* Negative Final  . Hgb  urine dipstick 05/31/2014 NEGATIVE  Negative Final  . Urobilinogen, UA 05/31/2014 0.2  0.0 - 1.0 Final  . Leukocytes, UA 05/31/2014 NEGATIVE  Negative Final  . Nitrite 05/31/2014 POSITIVE* Negative Final  . WBC, UA 05/31/2014 7-10/hpf* 0-2/hpf Final  . RBC / HPF 05/31/2014 0-2/hpf  0-2/hpf Final  . Squamous Epithelial / LPF 05/31/2014 Few(5-10/hpf)* Rare(0-4/hpf) Final  . Bacteria, UA 05/31/2014 Many(>50/hpf)* None Final  . Yeast, UA 05/31/2014 Presence of* None Final      Medication List       This list is accurate as of: 06/04/14 10:40 AM.  Always use your most recent med list.               aspirin 325 MG EC tablet  Take 325 mg by mouth daily.       cilostazol 100 MG tablet  Commonly known as:  PLETAL  TAKE ONE TABLET BY MOUTH TWICE DAILY     cloNIDine 0.1 MG tablet  Commonly known as:  CATAPRES  TAKE ONE TABLET BY MOUTH TWICE DAILY     gabapentin 100 MG capsule  Commonly known as:  NEURONTIN  TAKE ONE CAPSULE BY MOUTH TWICE DAILY     glucose blood test strip  Commonly known as:  FREESTYLE LITE  Use as instructed to check blood sugar 2 times a day dx code E11.9     insulin detemir 100 UNIT/ML injection  Commonly known as:  LEVEMIR  Inject 35 Units into the skin daily.     insulin regular 100 units/mL injection  Commonly known as:  NOVOLIN R,HUMULIN R  Inject 5-10 Units into the skin 2 (two) times daily before a meal. 3-5 in am and 10 at supper     levofloxacin 500 MG tablet  Commonly known as:  LEVAQUIN     lisinopril 5 MG tablet  Commonly known as:  PRINIVIL,ZESTRIL  TAKE ONE TABLET BY MOUTH ONCE DAILY     metFORMIN 750 MG 24 hr tablet  Commonly known as:  GLUCOPHAGE-XR  TAKE ONE TABLET BY MOUTH ONCE DAILY     metoCLOPramide 10 MG tablet  Commonly known as:  REGLAN  TAKE ONE TABLET BY MOUTH 4 TIMES DAILY     omeprazole 20 MG capsule  Commonly known as:  PRILOSEC  TAKE ONE CAPSULE BY MOUTH ONCE DAILY (DUE  FOR  OFFICE  VISIT  THIS  MONTH)     ondansetron 4 MG disintegrating tablet  Commonly known as:  ZOFRAN-ODT     simvastatin 40 MG tablet  Commonly known as:  ZOCOR  TAKE ONE TABLET BY MOUTH ONCE DAILY EVERY EVENING     Vitamin D (Ergocalciferol) 50000 UNITS Caps capsule  Commonly known as:  DRISDOL  TAKE ONE CAPSULE BY MOUTH EVERY 7 DAYS        Allergies:  Allergies  Allergen Reactions  . Morphine And Related Other (See Comments)    Hallucenations   . Penicillins Rash    Swelling    Past Medical History  Diagnosis Date  . Hypertension   . Hyperlipidemia   . Diabetes mellitus   . Peripheral vascular disease   . Stroke   . Glaucoma   . Blindness and low vision     right eye without  vision and left eye some vision remains    Past Surgical History  Procedure Laterality Date  . Refractive surgery      both eyes  . Back surgery      X2  . Abdominal  hysterectomy    . Colonoscopy  July 09, 2012  . Spine surgery      Family History  Problem Relation Age of Onset  . Cancer Mother   . Heart disease Mother   . Diabetes Father   . Cancer Brother   . Cancer Brother     Social History:  reports that she has been smoking Cigarettes.  She has been smoking about 1.00 pack per day. She has never used smokeless tobacco. She reports that she does not drink alcohol or use illicit drugs.  Review of Systems:  DYSPHAGIA: She appears to have a cyst in the vallecula and is going to have this removed surgically by ENT surgeon  No significant abnormality of thyroid on CT scan and TSH was normal  Blindness: She has absent vision on the right side and can see silhouettes only on the left.  Has been refusing to go to the eye doctor because of the cost   HYPERTENSION:   taking clonidine and lisinopril low dose.  Blood pressure appears controlled  HYPERLIPIDEMIA: The lipid abnormality consists of elevated LDL controlled with simvastatin.  Her baseline LDL was 202  Lab Results  Component Value Date   CHOL 131 06/29/2013   HDL 52.90 06/29/2013   LDLCALC 55 06/29/2013   TRIG 117.0 06/29/2013   CHOLHDL 2 06/29/2013     Vitamin D: Taking her 50,000 unit dosage once a month and her level is quite normal now     Examination:   BP 136/72 mmHg  Pulse 86  Temp(Src) 97.9 F (36.6 C)  Resp 16  Ht 5\' 3"  (1.6 m)  Wt 145 lb 3.2 oz (65.862 kg)  BMI 25.73 kg/m2  SpO2 93%  Body mass index is 25.73 kg/(m^2).   Posterior pharynx appears normal  ASSESSMENT/ PLAN:   Diabetes type 2   The patient's blood sugars are fairly good as judged by her A1c and home blood sugars However she is checking only a few readings and only after supper.    Recommended the following:  For now  will continue her insulin unchanged as above  She will try to rotate the times when she checks her blood sugar and check more often.  She will call if she has any more low sugars  Need to continue her vitamin D once a month     There are no Patient Instructions on file for this visit.  Edvardo Honse 06/04/2014, 10:40 AM

## 2014-06-04 NOTE — Progress Notes (Signed)
Ms Daria Mcardle- Dorina Hoyer does not see a cardiologist, denies chest pain, shortness of breath- except when walking too fast.  EKG abnormal.  Dr Marin Comment  viewed today's EKG and EKG from 11/47/2011 and did not see any change.  No further orders given.

## 2014-06-07 ENCOUNTER — Encounter (HOSPITAL_COMMUNITY)
Admission: RE | Disposition: A | Discharge status: Home / Self Care | Payer: Self-pay | Source: Ambulatory Visit | Attending: Otolaryngology

## 2014-06-07 ENCOUNTER — Encounter (HOSPITAL_COMMUNITY): Payer: Self-pay | Admitting: Anesthesiology

## 2014-06-07 ENCOUNTER — Ambulatory Visit (HOSPITAL_COMMUNITY): Payer: Medicare Other | Admitting: Anesthesiology

## 2014-06-07 ENCOUNTER — Ambulatory Visit (HOSPITAL_COMMUNITY)
Admission: RE | Admit: 2014-06-07 | Discharge: 2014-06-07 | Disposition: A | Discharge status: Home / Self Care | Payer: Medicare Other | Source: Ambulatory Visit | Attending: Otolaryngology | Admitting: Otolaryngology

## 2014-06-07 DIAGNOSIS — Z7982 Long term (current) use of aspirin: Secondary | ICD-10-CM | POA: Insufficient documentation

## 2014-06-07 DIAGNOSIS — E119 Type 2 diabetes mellitus without complications: Secondary | ICD-10-CM | POA: Diagnosis not present

## 2014-06-07 DIAGNOSIS — E785 Hyperlipidemia, unspecified: Secondary | ICD-10-CM | POA: Insufficient documentation

## 2014-06-07 DIAGNOSIS — I10 Essential (primary) hypertension: Secondary | ICD-10-CM | POA: Insufficient documentation

## 2014-06-07 DIAGNOSIS — J370 Chronic laryngitis: Secondary | ICD-10-CM | POA: Diagnosis not present

## 2014-06-07 DIAGNOSIS — H5441 Blindness, right eye, normal vision left eye: Secondary | ICD-10-CM | POA: Insufficient documentation

## 2014-06-07 DIAGNOSIS — Z7902 Long term (current) use of antithrombotics/antiplatelets: Secondary | ICD-10-CM | POA: Insufficient documentation

## 2014-06-07 DIAGNOSIS — Z79899 Other long term (current) drug therapy: Secondary | ICD-10-CM | POA: Insufficient documentation

## 2014-06-07 DIAGNOSIS — I739 Peripheral vascular disease, unspecified: Secondary | ICD-10-CM | POA: Insufficient documentation

## 2014-06-07 DIAGNOSIS — J387 Other diseases of larynx: Secondary | ICD-10-CM | POA: Diagnosis not present

## 2014-06-07 DIAGNOSIS — F17210 Nicotine dependence, cigarettes, uncomplicated: Secondary | ICD-10-CM | POA: Insufficient documentation

## 2014-06-07 DIAGNOSIS — Z794 Long term (current) use of insulin: Secondary | ICD-10-CM | POA: Insufficient documentation

## 2014-06-07 DIAGNOSIS — Z8673 Personal history of transient ischemic attack (TIA), and cerebral infarction without residual deficits: Secondary | ICD-10-CM | POA: Insufficient documentation

## 2014-06-07 DIAGNOSIS — H409 Unspecified glaucoma: Secondary | ICD-10-CM | POA: Insufficient documentation

## 2014-06-07 HISTORY — PX: DIRECT LARYNGOSCOPY: SHX5326

## 2014-06-07 LAB — GLUCOSE, CAPILLARY
Glucose-Capillary: 109 mg/dL — ABNORMAL HIGH (ref 70–99)
Glucose-Capillary: 135 mg/dL — ABNORMAL HIGH (ref 70–99)

## 2014-06-07 SURGERY — LARYNGOSCOPY, DIRECT
Anesthesia: General | Site: Throat

## 2014-06-07 MED ORDER — MIDAZOLAM HCL 5 MG/5ML IJ SOLN
INTRAMUSCULAR | Status: DC | PRN
  Administered 2014-06-07: 1 mg via INTRAVENOUS

## 2014-06-07 MED ORDER — ONDANSETRON HCL 4 MG/2ML IJ SOLN
INTRAMUSCULAR | Status: DC | PRN
  Administered 2014-06-07: 4 mg via INTRAVENOUS

## 2014-06-07 MED ORDER — SUCCINYLCHOLINE CHLORIDE 20 MG/ML IJ SOLN
INTRAMUSCULAR | Status: AC
  Filled 2014-06-07: qty 2

## 2014-06-07 MED ORDER — LACTATED RINGERS IV SOLN
INTRAVENOUS | Status: DC
  Administered 2014-06-07: 50 mL/h via INTRAVENOUS

## 2014-06-07 MED ORDER — FENTANYL CITRATE (PF) 100 MCG/2ML IJ SOLN
25.0000 ug | INTRAMUSCULAR | Status: DC | PRN
  Administered 2014-06-07 (×2): 50 ug via INTRAVENOUS

## 2014-06-07 MED ORDER — MIDAZOLAM HCL 2 MG/2ML IJ SOLN
INTRAMUSCULAR | Status: AC
Start: 2014-06-07 — End: 2014-06-07
  Filled 2014-06-07: qty 2

## 2014-06-07 MED ORDER — EPINEPHRINE HCL (NASAL) 0.1 % NA SOLN
NASAL | Status: AC
  Filled 2014-06-07: qty 30

## 2014-06-07 MED ORDER — SUGAMMADEX SODIUM 200 MG/2ML IV SOLN
INTRAVENOUS | Status: DC | PRN
  Administered 2014-06-07: 130 mg via INTRAVENOUS

## 2014-06-07 MED ORDER — ARTIFICIAL TEARS OP OINT
TOPICAL_OINTMENT | OPHTHALMIC | Status: DC | PRN
  Administered 2014-06-07: 1 via OPHTHALMIC

## 2014-06-07 MED ORDER — ARTIFICIAL TEARS OP OINT
TOPICAL_OINTMENT | OPHTHALMIC | Status: AC
  Filled 2014-06-07: qty 3.5

## 2014-06-07 MED ORDER — FENTANYL CITRATE (PF) 250 MCG/5ML IJ SOLN
INTRAMUSCULAR | Status: AC
  Filled 2014-06-07: qty 5

## 2014-06-07 MED ORDER — FENTANYL CITRATE (PF) 100 MCG/2ML IJ SOLN
INTRAMUSCULAR | Status: AC
  Administered 2014-06-07: 50 ug via INTRAVENOUS
  Filled 2014-06-07: qty 2

## 2014-06-07 MED ORDER — ONDANSETRON HCL 4 MG/2ML IJ SOLN: 4.0000 mg | Freq: Once | INTRAMUSCULAR | Status: DC | PRN

## 2014-06-07 MED ORDER — EPINEPHRINE HCL (NASAL) 0.1 % NA SOLN
NASAL | Status: DC | PRN
  Administered 2014-06-07: 30 mL via TOPICAL

## 2014-06-07 MED ORDER — ROCURONIUM BROMIDE 100 MG/10ML IV SOLN
INTRAVENOUS | Status: DC | PRN
  Administered 2014-06-07: 20 mg via INTRAVENOUS

## 2014-06-07 MED ORDER — SUGAMMADEX SODIUM 200 MG/2ML IV SOLN
INTRAVENOUS | Status: AC
  Filled 2014-06-07: qty 2

## 2014-06-07 MED ORDER — HYDRALAZINE HCL 20 MG/ML IJ SOLN
INTRAMUSCULAR | Status: AC
  Administered 2014-06-07: 10 mg via INTRAVENOUS
  Filled 2014-06-07: qty 1

## 2014-06-07 MED ORDER — PROPOFOL 10 MG/ML IV BOLUS
INTRAVENOUS | Status: DC | PRN
  Administered 2014-06-07: 100 mg via INTRAVENOUS
  Administered 2014-06-07: 50 mg via INTRAVENOUS

## 2014-06-07 MED ORDER — 0.9 % SODIUM CHLORIDE (POUR BTL) OPTIME
TOPICAL | Status: DC | PRN
  Administered 2014-06-07: 1000 mL

## 2014-06-07 MED ORDER — FENTANYL CITRATE (PF) 100 MCG/2ML IJ SOLN
INTRAMUSCULAR | Status: DC | PRN
  Administered 2014-06-07 (×2): 50 ug via INTRAVENOUS

## 2014-06-07 MED ORDER — LIDOCAINE HCL (CARDIAC) 20 MG/ML IV SOLN
INTRAVENOUS | Status: AC
  Filled 2014-06-07: qty 5

## 2014-06-07 MED ORDER — ROCURONIUM BROMIDE 50 MG/5ML IV SOLN
INTRAVENOUS | Status: AC
  Filled 2014-06-07: qty 1

## 2014-06-07 MED ORDER — SUCCINYLCHOLINE CHLORIDE 20 MG/ML IJ SOLN
INTRAMUSCULAR | Status: DC | PRN
  Administered 2014-06-07 (×2): 100 mg via INTRAVENOUS
  Administered 2014-06-07: 50 mg via INTRAVENOUS

## 2014-06-07 MED ORDER — LACTATED RINGERS IV SOLN
INTRAVENOUS | Status: DC | PRN
  Administered 2014-06-07: 09:00:00 via INTRAVENOUS

## 2014-06-07 MED ORDER — PROPOFOL 10 MG/ML IV BOLUS
INTRAVENOUS | Status: AC
  Filled 2014-06-07: qty 20

## 2014-06-07 MED ORDER — LIDOCAINE HCL (CARDIAC) 20 MG/ML IV SOLN
INTRAVENOUS | Status: DC | PRN
  Administered 2014-06-07: 100 mg via INTRAVENOUS

## 2014-06-07 MED ORDER — CLINDAMYCIN PHOSPHATE 600 MG/50ML IV SOLN
600.0000 mg | INTRAVENOUS | Status: AC
  Administered 2014-06-07: 600 mg via INTRAVENOUS
  Filled 2014-06-07 (×2): qty 50

## 2014-06-07 MED ORDER — ONDANSETRON HCL 4 MG/2ML IJ SOLN
INTRAMUSCULAR | Status: AC
  Filled 2014-06-07: qty 2

## 2014-06-07 MED ORDER — LABETALOL HCL 5 MG/ML IV SOLN
INTRAVENOUS | Status: AC
  Administered 2014-06-07: 5 mg via INTRAVENOUS
  Filled 2014-06-07: qty 4

## 2014-06-07 MED ORDER — LABETALOL HCL 5 MG/ML IV SOLN
5.0000 mg | INTRAVENOUS | Status: DC | PRN
  Administered 2014-06-07 (×4): 5 mg via INTRAVENOUS

## 2014-06-07 MED ORDER — HYDRALAZINE HCL 20 MG/ML IJ SOLN
10.0000 mg | Freq: Once | INTRAMUSCULAR | Status: AC
  Administered 2014-06-07: 10 mg via INTRAVENOUS

## 2014-06-07 SURGICAL SUPPLY — 21 items
CANISTER SUCTION 2500CC (MISCELLANEOUS) ×2 IMPLANT
COVER MAYO STAND STRL (DRAPES) ×1 IMPLANT
COVER TABLE BACK 60X90 (DRAPES) ×2 IMPLANT
DRAPE PROXIMA HALF (DRAPES) ×2 IMPLANT
ELECT BLADE 6.5 EXT (BLADE) ×1 IMPLANT
GAUZE SPONGE 4X4 16PLY XRAY LF (GAUZE/BANDAGES/DRESSINGS) ×1 IMPLANT
GLOVE SURG SS PI 7.0 STRL IVOR (GLOVE) ×1 IMPLANT
GLOVE SURG SS PI 7.5 STRL IVOR (GLOVE) ×2 IMPLANT
GOWN STRL REUS W/ TWL LRG LVL3 (GOWN DISPOSABLE) IMPLANT
GOWN STRL REUS W/TWL LRG LVL3 (GOWN DISPOSABLE) ×4
GUARD TEETH (MISCELLANEOUS) ×2 IMPLANT
KIT BASIN OR (CUSTOM PROCEDURE TRAY) ×2 IMPLANT
NDL HYPO 25GX1X1/2 BEV (NEEDLE) IMPLANT
NEEDLE HYPO 25GX1X1/2 BEV (NEEDLE) IMPLANT
NS IRRIG 1000ML POUR BTL (IV SOLUTION) ×2 IMPLANT
PAD ARMBOARD 7.5X6 YLW CONV (MISCELLANEOUS) ×4 IMPLANT
PATTIES SURGICAL .5 X1 (DISPOSABLE) ×2 IMPLANT
PENCIL BUTTON HOLSTER BLD 10FT (ELECTRODE) ×1 IMPLANT
SOLUTION ANTI FOG 6CC (MISCELLANEOUS) ×2 IMPLANT
TOWEL OR 17X24 6PK STRL BLUE (TOWEL DISPOSABLE) ×4 IMPLANT
TUBE CONNECTING 12X1/4 (SUCTIONS) ×2 IMPLANT

## 2014-06-07 NOTE — Transfer of Care (Signed)
Immediate Anesthesia Transfer of Care Note  Patient: Julie Brewer  Procedure(s) Performed: Procedure(s): DIRECT LARYNGOSCOPY with BIOPSY and EXCISION VOLLECULAR CYST (N/A)  Patient Location: PACU  Anesthesia Type:General  Level of Consciousness: awake, alert , oriented and sedated  Airway & Oxygen Therapy: Patient Spontanous Breathing and Patient connected to face mask oxygen  Post-op Assessment: Report given to RN, Post -op Vital signs reviewed and stable and Patient moving all extremities  Post vital signs: Reviewed and stable  Last Vitals:  Filed Vitals:   06/07/14 1053  BP:   Pulse:   Temp: 36.4 C  Resp:     Complications: No apparent anesthesia complications

## 2014-06-07 NOTE — Anesthesia Preprocedure Evaluation (Signed)
Anesthesia Evaluation  Patient identified by MRN, date of birth, ID band Patient awake    Reviewed: Allergy & Precautions, NPO status , Patient's Chart, lab work & pertinent test results  Airway Mallampati: II  TM Distance: >3 FB Neck ROM: Full    Dental  (+) Edentulous Upper, Edentulous Lower   Pulmonary Current Smoker,  breath sounds clear to auscultation        Cardiovascular hypertension, Rhythm:Regular Rate:Normal     Neuro/Psych    GI/Hepatic   Endo/Other  diabetes  Renal/GU      Musculoskeletal   Abdominal   Peds  Hematology   Anesthesia Other Findings   Reproductive/Obstetrics                             Anesthesia Physical Anesthesia Plan  ASA: III  Anesthesia Plan: General   Post-op Pain Management:    Induction: Intravenous  Airway Management Planned: Oral ETT  Additional Equipment:   Intra-op Plan:   Post-operative Plan:   Informed Consent: I have reviewed the patients History and Physical, chart, labs and discussed the procedure including the risks, benefits and alternatives for the proposed anesthesia with the patient or authorized representative who has indicated his/her understanding and acceptance.     Plan Discussed with: CRNA and Anesthesiologist  Anesthesia Plan Comments:         Anesthesia Quick Evaluation

## 2014-06-07 NOTE — Op Note (Signed)
DATE OF OPERATION: 06/07/2014 Surgeon: Ruby Cola Procedure Performed: direct laryngoscopy with telescope, with biopsy [phone]  PREOPERATIVE DIAGNOSIS: vallecular cyst POSTOPERATIVE DIAGNOSIS: vallecular cyst  SURGEON: Ruby Cola ANESTHESIA: General endotracheal.  ESTIMATED BLOOD LOSS: less than 82mL DRAINS: none SPECIMENS: vallecular cyst INDICATIONS: The patient is a 64yo with a history of vallecular cyst DESCRIPTION OF OPERATION: The patient was brought to the operating room and was placed in the supine position and was placed under general endotracheal anesthesia by anesthesiology using the glidescope.   Direct laryngoscopy was performed using the Dedo laryngoscope. 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 The patient was suspended using the Dedo and jackson suspension and then the mass was visualized using the 0 degree telescope. I used the extended Bovie, laryngeal scissors, and the straight biopsy forceps to remove the left vallecular cyst in its entirety. Once the mass was removed and passed off the true vocal folds were inspected with the [facility] and noted to be normal and uninvolved. Hemostasis was obtained using topical 1:1000 epinephrine pledgets which were then removed. The stomach was suctioned out using the Dedo and flexible suction which was then removed. The dedo was removed and the patient was turned back to anesthesia and awakened from anesthesia and extubated without difficulty. The patient tolerated the procedure well with no immediate complications and was taken to the postoperative recovery area in good condition.   Dr. Ruby Cola was present and performed the entire procedure. 06/07/2014  10:51 AM Ruby Cola

## 2014-06-07 NOTE — Anesthesia Procedure Notes (Addendum)
Procedure Name: Intubation Date/Time: 06/07/2014 9:52 AM Performed by: Scheryl Darter Pre-anesthesia Checklist: Patient identified, Emergency Drugs available, Suction available, Patient being monitored and Timeout performed Patient Re-evaluated:Patient Re-evaluated prior to inductionOxygen Delivery Method: Circle system utilized Preoxygenation: Pre-oxygenation with 100% oxygen Intubation Type: IV induction Ventilation: Mask ventilation without difficulty Laryngoscope Size: Mac, 3 and Glidescope Grade View: Grade II Tube type: Oral Tube size: 6.5 mm Number of attempts: 4 (DL x 4/ cyst occludung visualization of cords/trachea/making ETT passage difficult/would have been class 1 without cyst) Airway Equipment and Method: Stylet Placement Confirmation: ETT inserted through vocal cords under direct vision,  positive ETCO2 and breath sounds checked- equal and bilateral Secured at: 22 cm Tube secured with: Tape Dental Injury: Teeth and Oropharynx as per pre-operative assessment  Difficulty Due To: Difficulty was anticipated

## 2014-06-07 NOTE — Discharge Instructions (Signed)
Rx for pain medication, zofran, and antibiotic were sent to patient's pharmacy ([facility]). Regular diet, up ad lib. Follow up with Dr. Simeon Craft in 1 to 2 weeks.

## 2014-06-07 NOTE — Anesthesia Postprocedure Evaluation (Signed)
  Anesthesia Post-op Note  Patient: Julie Brewer  Procedure(s) Performed: Procedure(s): DIRECT LARYNGOSCOPY with BIOPSY and EXCISION VOLLECULAR CYST (N/A)  Patient Location: PACU  Anesthesia Type:General  Level of Consciousness: awake, alert  and oriented  Airway and Oxygen Therapy: Patient Spontanous Breathing  Post-op Pain: mild  Post-op Assessment: Post-op Vital signs reviewed, Patient's Cardiovascular Status Stable, Respiratory Function Stable, Patent Airway and Pain level controlled  Post-op Vital Signs: stable  Last Vitals:  Filed Vitals:   06/07/14 1330  BP:   Pulse: 90  Temp: 36.5 C  Resp: 18    Complications: No apparent anesthesia complications

## 2014-06-08 ENCOUNTER — Encounter (HOSPITAL_COMMUNITY): Payer: Self-pay | Admitting: Otolaryngology

## 2014-06-09 ENCOUNTER — Other Ambulatory Visit: Payer: Self-pay | Admitting: Endocrinology

## 2014-06-15 ENCOUNTER — Other Ambulatory Visit: Payer: Self-pay | Admitting: Endocrinology

## 2014-07-05 ENCOUNTER — Other Ambulatory Visit: Payer: Self-pay | Admitting: Endocrinology

## 2014-07-07 ENCOUNTER — Ambulatory Visit (INDEPENDENT_AMBULATORY_CARE_PROVIDER_SITE_OTHER): Payer: Medicare Other | Admitting: Podiatry

## 2014-07-07 ENCOUNTER — Encounter: Payer: Self-pay | Admitting: Podiatry

## 2014-07-07 VITALS — BP 158/81 | HR 81 | Resp 12

## 2014-07-07 DIAGNOSIS — M79676 Pain in unspecified toe(s): Secondary | ICD-10-CM | POA: Diagnosis not present

## 2014-07-07 DIAGNOSIS — E1151 Type 2 diabetes mellitus with diabetic peripheral angiopathy without gangrene: Secondary | ICD-10-CM | POA: Diagnosis not present

## 2014-07-07 DIAGNOSIS — Q828 Other specified congenital malformations of skin: Secondary | ICD-10-CM

## 2014-07-07 DIAGNOSIS — B351 Tinea unguium: Secondary | ICD-10-CM

## 2014-07-07 NOTE — Patient Instructions (Signed)
Diabetes and Foot Care Diabetes may cause you to have problems because of poor blood supply (circulation) to your feet and legs. This may cause the skin on your feet to become thinner, break easier, and heal more slowly. Your skin may become dry, and the skin may peel and crack. You may also have nerve damage in your legs and feet causing decreased feeling in them. You may not notice minor injuries to your feet that could lead to infections or more serious problems. Taking care of your feet is one of the most important things you can do for yourself.  HOME CARE INSTRUCTIONS  Wear shoes at all times, even in the house. Do not go barefoot. Bare feet are easily injured.  Check your feet daily for blisters, cuts, and redness. If you cannot see the bottom of your feet, use a mirror or ask someone for help.  Wash your feet with warm water (do not use hot water) and mild soap. Then pat your feet and the areas between your toes until they are completely dry. Do not soak your feet as this can dry your skin.  Apply a moisturizing lotion or petroleum jelly (that does not contain alcohol and is unscented) to the skin on your feet and to dry, brittle toenails. Do not apply lotion between your toes.  Trim your toenails straight across. Do not dig under them or around the cuticle. File the edges of your nails with an emery board or nail file.  Do not cut corns or calluses or try to remove them with medicine.  Wear clean socks or stockings every day. Make sure they are not too tight. Do not wear knee-high stockings since they may decrease blood flow to your legs.  Wear shoes that fit properly and have enough cushioning. To break in new shoes, wear them for just a few hours a day. This prevents you from injuring your feet. Always look in your shoes before you put them on to be sure there are no objects inside.  Do not cross your legs. This may decrease the blood flow to your feet.  If you find a minor scrape,  cut, or break in the skin on your feet, keep it and the skin around it clean and dry. These areas may be cleansed with mild soap and water. Do not cleanse the area with peroxide, alcohol, or iodine.  When you remove an adhesive bandage, be sure not to damage the skin around it.  If you have a wound, look at it several times a day to make sure it is healing.  Do not use heating pads or hot water bottles. They may burn your skin. If you have lost feeling in your feet or legs, you may not know it is happening until it is too late.  Make sure your health care provider performs a complete foot exam at least annually or more often if you have foot problems. Report any cuts, sores, or bruises to your health care provider immediately. SEEK MEDICAL CARE IF:   You have an injury that is not healing.  You have cuts or breaks in the skin.  You have an ingrown nail.  You notice redness on your legs or feet.  You feel burning or tingling in your legs or feet.  You have pain or cramps in your legs and feet.  Your legs or feet are numb.  Your feet always feel cold. SEEK IMMEDIATE MEDICAL CARE IF:   There is increasing redness,   swelling, or pain in or around a wound.  There is a red line that goes up your leg.  Pus is coming from a wound.  You develop a fever or as directed by your health care provider.  You notice a bad smell coming from an ulcer or wound. Document Released: 01/13/2000 Document Revised: 09/17/2012 Document Reviewed: 06/24/2012 [employer] Patient Information 2015 [employer]. This information is not intended to replace advice given to you by your health care provider. Make sure you discuss any questions you have with your health care provider.  

## 2014-07-08 NOTE — Progress Notes (Signed)
Patient ID: Julie Brewer, female   DOB: [birth year], 64 y.o.   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

## 2014-09-03 ENCOUNTER — Encounter: Payer: Self-pay | Admitting: Family

## 2014-09-06 ENCOUNTER — Ambulatory Visit (HOSPITAL_COMMUNITY)
Admission: RE | Admit: 2014-09-06 | Discharge: 2014-09-06 | Disposition: A | Discharge status: Home / Self Care | Payer: Medicare Other | Source: Ambulatory Visit | Attending: Family | Admitting: Family

## 2014-09-06 ENCOUNTER — Ambulatory Visit (INDEPENDENT_AMBULATORY_CARE_PROVIDER_SITE_OTHER): Payer: Medicare Other | Admitting: Family

## 2014-09-06 ENCOUNTER — Encounter: Payer: Self-pay | Admitting: Family

## 2014-09-06 VITALS — BP 164/81 | HR 80 | Temp 98.0°F | Resp 16 | Ht 63.25 in | Wt 140.0 lb

## 2014-09-06 DIAGNOSIS — Z720 Tobacco use: Secondary | ICD-10-CM | POA: Diagnosis not present

## 2014-09-06 DIAGNOSIS — I739 Peripheral vascular disease, unspecified: Secondary | ICD-10-CM | POA: Insufficient documentation

## 2014-09-06 DIAGNOSIS — E1159 Type 2 diabetes mellitus with other circulatory complications: Secondary | ICD-10-CM

## 2014-09-06 DIAGNOSIS — I70219 Atherosclerosis of native arteries of extremities with intermittent claudication, unspecified extremity: Secondary | ICD-10-CM | POA: Diagnosis not present

## 2014-09-06 DIAGNOSIS — F17200 Nicotine dependence, unspecified, uncomplicated: Secondary | ICD-10-CM

## 2014-09-06 DIAGNOSIS — E1151 Type 2 diabetes mellitus with diabetic peripheral angiopathy without gangrene: Secondary | ICD-10-CM

## 2014-09-06 NOTE — Progress Notes (Signed)
Filed Vitals:   09/06/14 1050 09/06/14 1056  BP: 172/83 164/81  Pulse: 82 80  Temp: 98 F (36.7 C)   TempSrc: Oral   Resp: 16   Height: 5' 3.25" (1.607 m)   Weight: 140 lb (63.504 kg)   SpO2: 98%

## 2014-09-06 NOTE — Progress Notes (Signed)
[facility] HISTORY AND PHYSICAL -PAD  History of Present Illness Julie Brewer is a 64 y.o. female patient of Dr. Trula Slade.  The patient is back today for followup of her peripheral vascular disease. When Dr. Trula Slade met her in 2011 she was having claudication in her left leg at approximately one block. We treated her with cilostazol, and she did much better.  Her left calf feels heavy after walking about 10 minutes, relieved with rest, denies non healing wounds. She states she had a TIA in about 2010 as manifested by slurred speech that resolved, denies hemiparesis, denies further loss of vision. She states that her loss of vision in both eyes is due to glaucoma.  She continues to be medically managed for hypertension and hyperlipidemia as well as her diabetes. She is on a statin as well as an ACE inhibitor.  The patient denies New Medical or Surgical History.  Pt Diabetic: Yes, states in control Pt smoker: smoker (1 ppd, started at age 56 yrs)  Pt meds include: Statin :Yes ASA: Yes Other anticoagulants/antiplatelets: Pletal     Past Medical History  Diagnosis Date  . Hypertension   . Hyperlipidemia   . Peripheral vascular disease   . Glaucoma   . 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 Stroke     no residual  . Shortness of breath dyspnea     with exdrtion, "Walkling too fast"  . Asthma     No probnlems recently  . Pneumonia 2006  . GERD (gastroesophageal reflux disease)   . 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 Smoking status: Current Every Day Smoker -- 0.50 packs/day for 30 years    Types: Cigarettes  . Smokeless tobacco: Never Used  . Alcohol Use: No    Family History Family History  Problem Relation Age of Onset  . Cancer Mother   . Heart disease Mother   . Diabetes Father   . Cancer  Brother   . Cancer Brother     Past Surgical History  Procedure Laterality Date  . Refractive surgery Bilateral     both eyes  . Abdominal hysterectomy    . Colonoscopy  July 09, 2012  . Spine surgery      lumbar  . Cervical fusion      with graft from hip  . Direct laryngoscopy N/A 06/07/2014    Procedure: DIRECT LARYNGOSCOPY with BIOPSY and EXCISION VOLLECULAR CYST;  Surgeon: Ruby Cola, MD;  Location: [facility];  Service: ENT;  Laterality: N/A;    Allergies  Allergen Reactions  . Morphine And Related Other (See Comments)    Hallucenations   . Penicillins Rash    Swelling    Current Outpatient Prescriptions  Medication Sig Dispense Refill  . aspirin 325 MG EC tablet Take 325 mg by mouth daily.    . cilostazol (PLETAL) 100 MG tablet TAKE ONE TABLET BY MOUTH TWICE DAILY 60 tablet 6  . cloNIDine (CATAPRES) 0.1 MG tablet TAKE ONE TABLET BY MOUTH TWICE DAILY 60 tablet 5  . gabapentin (NEURONTIN) 100 MG capsule TAKE ONE CAPSULE BY MOUTH TWICE DAILY 180 capsule 1  . glucose blood (FREESTYLE LITE) test strip Use as instructed to check blood sugar 2 times a day dx code E11.9 200 each 1  .  insulin detemir (LEVEMIR) 100 UNIT/ML injection Inject 35 Units into the skin daily.     . insulin regular (NOVOLIN R,HUMULIN R) 100 units/mL injection Inject 5-10 Units into the skin 2 (two) times daily before a meal. 3-5 in am and 10 at supper    . levofloxacin (LEVAQUIN) 500 MG tablet     . lisinopril (PRINIVIL,ZESTRIL) 5 MG tablet TAKE ONE TABLET BY MOUTH ONCE DAILY 90 tablet 1  . metFORMIN (GLUCOPHAGE-XR) 750 MG 24 hr tablet TAKE ONE TABLET BY MOUTH ONCE DAILY 90 tablet 1  . metoCLOPramide (REGLAN) 10 MG tablet TAKE ONE TABLET BY MOUTH 4 TIMES DAILY 120 tablet 3  . omeprazole (PRILOSEC) 20 MG capsule TAKE ONE CAPSULE BY MOUTH ONCE DAILY (DUE FOR OFFICE VISIT THIS MONTH) 90 capsule 1  . ondansetron (ZOFRAN-ODT) 4 MG disintegrating tablet     . simvastatin (ZOCOR) 40 MG tablet TAKE ONE TABLET BY  MOUTH ONCE DAILY EVERY EVENING 90 tablet 1  . simvastatin (ZOCOR) 40 MG tablet TAKE ONE TABLET BY MOUTH ONCE DAILY IN THE EVENING 90 tablet 1  . Vitamin D, Ergocalciferol, (DRISDOL) 50000 UNITS CAPS capsule TAKE ONE CAPSULE BY MOUTH EVERY 7 DAYS (Patient taking differently: TAKE ONE CAPSULE BY MOUTH EVERY 30 DAYS) 12 capsule 0   No current facility-administered medications for this visit.    ROS: See HPI for pertinent positives and negatives.   Physical Examination  Filed Vitals:   09/06/14 1050 09/06/14 1056  BP: 172/83 164/81  Pulse: 82 80  Temp: 98 F (36.7 C)   TempSrc: Oral   Resp: 16   Height: 5' 3.25" (1.607 m)   Weight: 140 lb (63.504 kg)   SpO2: 98%    Body mass index is 24.59 kg/(m^2).  General: A&O x 3, WDWN. Gait: normal, using cane for the blind Eyes: Pupils are unequal and non reactive to light Pulmonary: CTAB, without wheezes , rales or rhonchi. Cardiac: regular Rythm , without detected murmur.     Carotid Bruits Right Left   Negative Negative  Aorta is not palpable. Radial pulses: are 2+ palpable and =.   VASCULAR EXAM: Extremities without ischemic changes  without Gangrene; without open wounds.     LE Pulses Right Left   FEMORAL 2+ palpable 1+ palpable    POPLITEAL not palpable  not palpable   POSTERIOR TIBIAL not palpable  not palpable    DORSALIS PEDIS  ANTERIOR TIBIAL 2+ palpable  faintly palpable    Abdomen: soft, NT, no palpable masses. Skin: no rashes, no ulcers. Musculoskeletal: no muscle wasting or atrophy. Neurologic: A&O X 3; Appropriate Affect, MOTOR FUNCTION: moving all extremities equally, motor strength 5/5 in UE's, 4/5 in LE's. Speech is fluent/normal. CN 2-12 intact except for loss of vision.           Non-Invasive Vascular Imaging: DATE: 09/06/2014 ABI: RIGHT: 1.12 (1.00, 08/31/13), Waveforms: triphasic, TBI: 0.84;  LEFT: 0.81 (0.67), Waveforms: tri and biphasic; TBI: 0.61   ASSESSMENT: LEINANI Brewer is a 64 y.o. female who has mild left calf claudication, no tissue loss in her lower extremities. 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 All triphasic waveforms except biphasic left DP. Her atherosclerotic risk factors include controlled DM and active smoking. Pt indicates she has no desire to quit smoking. She does not walk much since she is almost blind.   PLAN:  Pt advised to see her PCP ASAP re her  elevated blood pressure.  The patient was counseled re smoking cessation and given several free resources re smoking cessation.  Based on the patient's vascular studies and examination, pt will return to clinic in 1 year for ABI's.   Daily seated leg exercises and holding on to counter standing calf pumps as discussed and demonstrated.  I discussed in depth with the patient the nature of atherosclerosis, and emphasized the importance of maximal medical management including strict control of blood pressure, blood glucose, and lipid levels, obtaining regular exercise, and cessation of smoking.  The patient is aware that without maximal medical management the underlying atherosclerotic disease process will progress, limiting the benefit of any interventions.  The patient was given information about PAD including signs, symptoms, treatment, what symptoms should prompt the patient to seek immediate medical care, and risk reduction measures to take.  Clemon Chambers, RN, MSN, FNP-C [facility] Phone: [phone]  Clinic MD: Kellie Simmering  09/06/2014 11:09 AM

## 2014-09-06 NOTE — Patient Instructions (Signed)
Peripheral Vascular Disease Peripheral Vascular Disease (PVD), also called Peripheral Arterial Disease (PAD), is a circulation problem caused by cholesterol (atherosclerotic plaque) deposits in the arteries. PVD commonly occurs in the lower extremities (legs) but it can occur in other areas of the body, such as your arms. The cholesterol buildup in the arteries reduces blood flow which can cause pain and other serious problems. The presence of PVD can place a person at risk for Coronary Artery Disease (CAD).  CAUSES  Causes of PVD can be many. It is usually associated with more than one risk factor such as:   High Cholesterol.  Smoking.  Diabetes.  Lack of exercise or inactivity.  High blood pressure (hypertension).  Obesity.  Family history. SYMPTOMS   When the lower extremities are affected, patients with PVD may experience:  Leg pain with exertion or physical activity. This is called INTERMITTENT CLAUDICATION. This may present as cramping or numbness with physical activity. The location of the pain is associated with the level of blockage. For example, blockage at the abdominal level (distal abdominal aorta) may result in buttock or hip pain. Lower leg arterial blockage may result in calf pain.  As PVD becomes more severe, pain can develop with less physical activity.  In people with severe PVD, leg pain may occur at rest.  Other PVD signs and symptoms:  Leg numbness or weakness.  Coldness in the affected leg or foot, especially when compared to the other leg.  A change in leg color.  Patients with significant PVD are more prone to ulcers or sores on toes, feet or legs. These may take longer to heal or may reoccur. The ulcers or sores can become infected.  If signs and symptoms of PVD are ignored, gangrene may occur. This can result in the loss of toes or loss of an entire limb.  Not all leg pain is related to PVD. Other medical conditions can cause leg pain such  as:  Blood clots (embolism) or Deep Vein Thrombosis.  Inflammation of the blood vessels (vasculitis).  Spinal stenosis. DIAGNOSIS  Diagnosis of PVD can involve several different types of tests. These can include:  Pulse Volume Recording Method (PVR). This test is simple, painless and does not involve the use of X-rays. PVR involves measuring and comparing the blood pressure in the arms and legs. An ABI (Ankle-Brachial Index) is calculated. The normal ratio of blood pressures is 1. As this number becomes smaller, it indicates more severe disease.  < 0.95 - indicates significant narrowing in one or more leg vessels.  <0.8 - there will usually be pain in the foot, leg or buttock with exercise.  <0.4 - will usually have pain in the legs at rest.  <0.25 - usually indicates limb threatening PVD.  Doppler detection of pulses in the legs. This test is painless and checks to see if you have a pulses in your legs/feet.  A dye or contrast material (a substance that highlights the blood vessels so they show up on x-ray) may be given to help your caregiver better see the arteries for the following tests. The dye is eliminated from your body by the kidney's. Your caregiver may order blood work to check your kidney function and other laboratory values before the following tests are performed:  Magnetic Resonance Angiography (MRA). An MRA is a picture study of the blood vessels and arteries. The MRA machine uses a large magnet to produce images of the blood vessels.  Computed Tomography Angiography (CTA). A CTA   is a specialized x-ray that looks at how the blood flows in your blood vessels. An IV may be inserted into your arm so contrast dye can be injected.  Angiogram. Is a procedure that uses x-rays to look at your blood vessels. This procedure is minimally invasive, meaning a small incision (cut) is made in your groin. A small tube (catheter) is then inserted into the artery of your groin. The catheter  is guided to the blood vessel or artery your caregiver wants to examine. Contrast dye is injected into the catheter. X-rays are then taken of the blood vessel or artery. After the images are obtained, the catheter is taken out. TREATMENT  Treatment of PVD involves many interventions which may include:  Lifestyle changes:  Quitting smoking.  Exercise.  Following a low fat, low cholesterol diet.  Control of diabetes.  Foot care is very important to the PVD patient. Good foot care can help prevent infection.  Medication:  Cholesterol-lowering medicine.  Blood pressure medicine.  Anti-platelet drugs.  Certain medicines may reduce symptoms of Intermittent Claudication.  Interventional/Surgical options:  Angioplasty. An Angioplasty is a procedure that inflates a balloon in the blocked artery. This opens the blocked artery to improve blood flow.  Stent Implant. A wire mesh tube (stent) is placed in the artery. The stent expands and stays in place, allowing the artery to remain open.  Peripheral Bypass Surgery. This is a surgical procedure that reroutes the blood around a blocked artery to help improve blood flow. This type of procedure may be performed if Angioplasty or stent implants are not an option. SEEK IMMEDIATE MEDICAL CARE IF:   You develop pain or numbness in your arms or legs.  Your arm or leg turns cold, becomes blue in color.  You develop redness, warmth, swelling and pain in your arms or legs. MAKE SURE YOU:   Understand these instructions.  Will watch your condition.  Will get help right away if you are not doing well or get worse. Document Released: 02/23/2004 Document Revised: 04/09/2011 Document Reviewed: 01/20/2008 [employer] Patient Information 2015 [employer]. This information is not intended to replace advice given to you by your health care provider. Make sure you discuss any questions you have with your health care provider.    Smoking  Cessation Quitting smoking is important to your health and has many advantages. However, it is not always easy to quit since nicotine is a very addictive drug. Oftentimes, people try 3 times or more before being able to quit. This document explains the best ways for you to prepare to quit smoking. Quitting takes hard work and a lot of effort, but you can do it. ADVANTAGES OF QUITTING SMOKING  You will live longer, feel better, and live better.  Your body will feel the impact of quitting smoking almost immediately.  Within 20 minutes, blood pressure decreases. Your pulse returns to its normal level.  After 8 hours, carbon monoxide levels in the blood return to normal. Your oxygen level increases.  After 24 hours, the chance of having a heart attack starts to decrease. Your breath, hair, and body stop smelling like smoke.  After 48 hours, damaged nerve endings begin to recover. Your sense of taste and smell improve.  After 72 hours, the body is virtually free of nicotine. Your bronchial tubes relax and breathing becomes easier.  After 2 to 12 weeks, lungs can hold more air. Exercise becomes easier and circulation improves.  The risk of having a heart attack, stroke,   cancer, or lung disease is greatly reduced.  After 1 year, the risk of coronary heart disease is cut in half.  After 5 years, the risk of stroke falls to the same as a nonsmoker.  After 10 years, the risk of lung cancer is cut in half and the risk of other cancers decreases significantly.  After 15 years, the risk of coronary heart disease drops, usually to the level of a nonsmoker.  If you are pregnant, quitting smoking will improve your chances of having a healthy baby.  The people you live with, especially any children, will be healthier.  You will have extra money to spend on things other than cigarettes. QUESTIONS TO THINK ABOUT BEFORE ATTEMPTING TO QUIT You may want to talk about your answers with your health care  provider.  Why do you want to quit?  If you tried to quit in the past, what helped and what did not?  What will be the most difficult situations for you after you quit? How will you plan to handle them?  Who can help you through the tough times? Your family? Friends? A health care provider?  What pleasures do you get from smoking? What ways can you still get pleasure if you quit? Here are some questions to ask your health care provider:  How can you help me to be successful at quitting?  What medicine do you think would be best for me and how should I take it?  What should I do if I need more help?  What is smoking withdrawal like? How can I get information on withdrawal? GET READY  Set a quit date.  Change your environment by getting rid of all cigarettes, ashtrays, matches, and lighters in your home, car, or work. Do not let people smoke in your home.  Review your past attempts to quit. Think about what worked and what did not. GET SUPPORT AND ENCOURAGEMENT You have a better chance of being successful if you have help. You can get support in many ways.  Tell your family, friends, and coworkers that you are going to quit and need their support. Ask them not to smoke around you.  Get individual, group, or telephone counseling and support. Programs are available at local hospitals and health centers. Call your local health department for information about programs in your area.  Spiritual beliefs and practices may help some smokers quit.  Download a "quit meter" on your computer to keep track of quit statistics, such as how long you have gone without smoking, cigarettes not smoked, and money saved.  Get a self-help book about quitting smoking and staying off tobacco. LEARN NEW SKILLS AND BEHAVIORS  Distract yourself from urges to smoke. Talk to someone, go for a walk, or occupy your time with a task.  Change your normal routine. Take a different route to work. Drink tea  instead of coffee. Eat breakfast in a different place.  Reduce your stress. Take a hot bath, exercise, or read a book.  Plan something enjoyable to do every day. Reward yourself for not smoking.  Explore interactive web-based programs that specialize in helping you quit. GET MEDICINE AND USE IT CORRECTLY Medicines can help you stop smoking and decrease the urge to smoke. Combining medicine with the above behavioral methods and support can greatly increase your chances of successfully quitting smoking.  Nicotine replacement therapy helps deliver nicotine to your body without the negative effects and risks of smoking. Nicotine replacement therapy includes nicotine gum, lozenges,   inhalers, nasal sprays, and skin patches. Some may be available over-the-counter and others require a prescription.  Antidepressant medicine helps people abstain from smoking, but how this works is unknown. This medicine is available by prescription.  Nicotinic receptor partial agonist medicine simulates the effect of nicotine in your brain. This medicine is available by prescription. Ask your health care provider for advice about which medicines to use and how to use them based on your health history. Your health care provider will tell you what side effects to look out for if you choose to be on a medicine or therapy. Carefully read the information on the package. Do not use any other product containing nicotine while using a nicotine replacement product.  RELAPSE OR DIFFICULT SITUATIONS Most relapses occur within the first 3 months after quitting. Do not be discouraged if you start smoking again. Remember, most people try several times before finally quitting. You may have symptoms of withdrawal because your body is used to nicotine. You may crave cigarettes, be irritable, feel very hungry, cough often, get headaches, or have difficulty concentrating. The withdrawal symptoms are only temporary. They are strongest when you  first quit, but they will go away within 10-14 days. To reduce the chances of relapse, try to:  Avoid drinking alcohol. Drinking lowers your chances of successfully quitting.  Reduce the amount of caffeine you consume. Once you quit smoking, the amount of caffeine in your body increases and can give you symptoms, such as a rapid heartbeat, sweating, and anxiety.  Avoid smokers because they can make you want to smoke.  Do not let weight gain distract you. Many smokers will gain weight when they quit, usually less than 10 pounds. Eat a healthy diet and stay active. You can always lose the weight gained after you quit.  Find ways to improve your mood other than smoking. FOR MORE INFORMATION  www.smokefree.gov  Document Released: 01/09/2001 Document Revised: 06/01/2013 Document Reviewed: 04/26/2011 [employer] Patient Information 2015 [employer]. This information is not intended to replace advice given to you by your health care provider. Make sure you discuss any questions you have with your health care provider.    Smoking Cessation, Tips for Success If you are ready to quit smoking, congratulations! You have chosen to help yourself be healthier. Cigarettes bring nicotine, tar, carbon monoxide, and other irritants into your body. Your lungs, heart, and blood vessels will be able to work better without these poisons. There are many different ways to quit smoking. Nicotine gum, nicotine patches, a nicotine inhaler, or nicotine nasal spray can help with physical craving. Hypnosis, support groups, and medicines help break the habit of smoking. WHAT THINGS CAN I DO TO MAKE QUITTING EASIER?  Here are some tips to help you quit for good:  Pick a date when you will quit smoking completely. Tell all of your friends and family about your plan to quit on that date.  Do not try to slowly cut down on the number of cigarettes you are smoking. Pick a quit date and quit smoking completely starting on that  day.  Throw away all cigarettes.   Clean and remove all ashtrays from your home, work, and car.  On a card, write down your reasons for quitting. Carry the card with you and read it when you get the urge to smoke.  Cleanse your body of nicotine. Drink enough water and fluids to keep your urine clear or pale yellow. Do this after quitting to flush the nicotine from   your body.  Learn to predict your moods. Do not let a bad situation be your excuse to have a cigarette. Some situations in your life might tempt you into wanting a cigarette.  Never have "just one" cigarette. It leads to wanting another and another. Remind yourself of your decision to quit.  Change habits associated with smoking. If you smoked while driving or when feeling stressed, try other activities to replace smoking. Stand up when drinking your coffee. Brush your teeth after eating. Sit in a different chair when you read the paper. Avoid alcohol while trying to quit, and try to drink fewer caffeinated beverages. Alcohol and caffeine may urge you to smoke.  Avoid foods and drinks that can trigger a desire to smoke, such as sugary or spicy foods and alcohol.  Ask people who smoke not to smoke around you.  Have something planned to do right after eating or having a cup of coffee. For example, plan to take a walk or exercise.  Try a relaxation exercise to calm you down and decrease your stress. Remember, you may be tense and nervous for the first 2 weeks after you quit, but this will pass.  Find new activities to keep your hands busy. Play with a pen, coin, or rubber band. Doodle or draw things on paper.  Brush your teeth right after eating. This will help cut down on the craving for the taste of tobacco after meals. You can also try mouthwash.   Use oral substitutes in place of cigarettes. Try using lemon drops, carrots, cinnamon sticks, or chewing gum. Keep them handy so they are available when you have the urge to  smoke.  When you have the urge to smoke, try deep breathing.  Designate your home as a nonsmoking area.  If you are a heavy smoker, ask your health care provider about a prescription for nicotine chewing gum. It can ease your withdrawal from nicotine.  Reward yourself. Set aside the cigarette money you save and buy yourself something nice.  Look for support from others. Join a support group or smoking cessation program. Ask someone at home or at work to help you with your plan to quit smoking.  Always ask yourself, "Do I need this cigarette or is this just a reflex?" Tell yourself, "Today, I choose not to smoke," or "I do not want to smoke." You are reminding yourself of your decision to quit.  Do not replace cigarette smoking with electronic cigarettes (commonly called e-cigarettes). The safety of e-cigarettes is unknown, and some may contain harmful chemicals.  If you relapse, do not give up! Plan ahead and think about what you will do the next time you get the urge to smoke. HOW WILL I FEEL WHEN I QUIT SMOKING? You may have symptoms of withdrawal because your body is used to nicotine (the addictive substance in cigarettes). You may crave cigarettes, be irritable, feel very hungry, cough often, get headaches, or have difficulty concentrating. The withdrawal symptoms are only temporary. They are strongest when you first quit but will go away within 10-14 days. When withdrawal symptoms occur, stay in control. Think about your reasons for quitting. Remind yourself that these are signs that your body is healing and getting used to being without cigarettes. Remember that withdrawal symptoms are easier to treat than the major diseases that smoking can cause.  Even after the withdrawal is over, expect periodic urges to smoke. However, these cravings are generally short lived and will go away whether you   smoke or not. Do not smoke! WHAT RESOURCES ARE AVAILABLE TO HELP ME QUIT SMOKING? Your health care  provider can direct you to community resources or hospitals for support, which may include:  Group support.  Education.  Hypnosis.  Therapy. Document Released: 10/14/2003 Document Revised: 06/01/2013 Document Reviewed: 07/03/2012 [employer] Patient Information 2015 [employer]. This information is not intended to replace advice given to you by your health care provider. Make sure you discuss any questions you have with your health care provider.  

## 2014-09-13 ENCOUNTER — Other Ambulatory Visit: Payer: Self-pay | Admitting: Endocrinology

## 2014-09-14 ENCOUNTER — Other Ambulatory Visit (INDEPENDENT_AMBULATORY_CARE_PROVIDER_SITE_OTHER): Payer: Medicare Other

## 2014-09-14 DIAGNOSIS — E1165 Type 2 diabetes mellitus with hyperglycemia: Secondary | ICD-10-CM | POA: Diagnosis not present

## 2014-09-14 DIAGNOSIS — IMO0002 Reserved for concepts with insufficient information to code with codable children: Secondary | ICD-10-CM

## 2014-09-14 LAB — COMPREHENSIVE METABOLIC PANEL
ALBUMIN: 3.8 g/dL (ref 3.5–5.2)
ALT: 10 U/L (ref 0–35)
AST: 15 U/L (ref 0–37)
Alkaline Phosphatase: 60 U/L (ref 39–117)
BUN: 19 mg/dL (ref 6–23)
CALCIUM: 9.7 mg/dL (ref 8.4–10.5)
CHLORIDE: 106 meq/L (ref 96–112)
CO2: 26 mEq/L (ref 19–32)
CREATININE: 1.03 mg/dL (ref 0.40–1.20)
GFR: 69.84 mL/min (ref >60.00)
Glucose, Bld: 161 mg/dL — ABNORMAL HIGH (ref 70–99)
Potassium: 4.3 mEq/L (ref 3.5–5.1)
Sodium: 140 mEq/L (ref 135–145)
Total Bilirubin: 0.3 mg/dL (ref 0.2–1.2)
Total Protein: 7.1 g/dL (ref 6.0–8.3)

## 2014-09-14 LAB — LIPID PANEL
CHOLESTEROL: 181 mg/dL (ref 0–200)
HDL: 75.2 mg/dL (ref >39.00)
LDL CALC: 75 mg/dL (ref 0–99)
NonHDL: 105.79
TRIGLYCERIDES: 156 mg/dL — AB (ref 0.0–149.0)
Total CHOL/HDL Ratio: 2
VLDL: 31.2 mg/dL (ref 0.0–40.0)

## 2014-09-14 LAB — HEMOGLOBIN A1C: Hgb A1c MFr Bld: 5.9 % (ref 4.6–6.5)

## 2014-09-17 ENCOUNTER — Encounter: Payer: Self-pay | Admitting: Endocrinology

## 2014-09-17 ENCOUNTER — Ambulatory Visit (INDEPENDENT_AMBULATORY_CARE_PROVIDER_SITE_OTHER): Payer: Medicare Other | Admitting: Endocrinology

## 2014-09-17 VITALS — BP 128/72 | HR 87 | Temp 97.8°F | Resp 16 | Ht 63.25 in | Wt 137.2 lb

## 2014-09-17 DIAGNOSIS — E119 Type 2 diabetes mellitus without complications: Secondary | ICD-10-CM | POA: Diagnosis not present

## 2014-09-17 DIAGNOSIS — E785 Hyperlipidemia, unspecified: Secondary | ICD-10-CM

## 2014-09-17 NOTE — Progress Notes (Signed)
Patient ID: Julie Brewer, female   DOB: January 17, 1951, 64 y.o.   MRN: [id number]   Reason for Appointment:   History of Present Illness   Diagnosis: Type 2 DIABETES MELITUS, date of diagnosis:  1985  Prior history: She has been on insulin since diagnosis and on Lantus previously Also at some point had been started on Glucophage several years ago also Because of insurance preference Lantus was changed to Levemir and is using this twice a day She refuses to use analog rapid acting insulin because of cost and is using regular insulin for several years   Her blood sugars are generally well controlled and A1c usually under 7%  Recent history:   Insulin regimen: Levemir insulin 35 in the morning and none in the evening. Regular insulin 3-5 units a.m. and 10 before supper   She did not bring her monitor for review today and not clear what her blood sugar patterns are. Her insulin doses have been lower in the last few weeks and not taking Levemir only in the morning By history she thinks she has had excellent blood sugars both fasting and during the day and after supper Has had only one documented low blood sugar of 33 after supper and non-overnight now Clear how often she is checking her blood sugar  She does have nonspecific symptoms when her blood sugar is low and not consistently  She continues to be on low dose metformin ER Her A1c is still quite normal at 5.9  Oral hypoglycemic drugs: Metformin ER 750 at supper       Side effects from medications: None Proper timing of medications in relation to meals: Yes, 30 min ac.          Monitors blood glucose: Less than once a day Glucometer:  FreeStyle     Blood Glucose readings from recall: Am 74, 90; at 2-3 pm 80-90 pcs 120-150  Meals:  usually 2 meals per day at 10 AM and 5 PM.  protein sources: Egg, [place], chicken. Sweet drinks consumed only occasionally  Physical activity: exercise: Just walking within the house               Microalbumin has been persistently high previously, was normal at 26 in 9/15  Wt Readings from Last 3 Encounters:  09/17/14 137 lb 3.2 oz (62.234 kg)  09/06/14 140 lb (63.504 kg)  06/07/14 144 lb 13.5 oz (65.7 kg)   Lab Results  Component Value Date   HGBA1C 5.9 09/14/2014   HGBA1C 5.8 05/31/2014   HGBA1C 6.0 03/02/2014   Lab Results  Component Value Date   MICROALBUR 95.8* 05/31/2014   [place] 75 09/14/2014   CREATININE 1.03 09/14/2014     Appointment on 09/14/2014  Component Date Value Ref Range Status  . Hgb A1c MFr Bld 09/14/2014 5.9  4.6 - 6.5 % Final   Glycemic Control Guidelines for People with Diabetes:Non Diabetic:  <6%Goal of Therapy: <7%Additional Action Suggested:  >8%   . Sodium 09/14/2014 140  135 - 145 mEq/L Final  . Potassium 09/14/2014 4.3  3.5 - 5.1 mEq/L Final  . Chloride 09/14/2014 106  96 - 112 mEq/L Final  . CO2 09/14/2014 26  19 - 32 mEq/L Final  . Glucose, Bld 09/14/2014 161* 70 - 99 mg/dL Final  . BUN 09/14/2014 19  6 - 23 mg/dL Final  . Creatinine, Ser 09/14/2014 1.03  0.40 - 1.20 mg/dL Final  . Total Bilirubin 09/14/2014 0.3  0.2 - 1.2  mg/dL Final  . Alkaline Phosphatase 09/14/2014 60  39 - 117 U/L Final  . AST 09/14/2014 15  0 - 37 U/L Final  . ALT 09/14/2014 10  0 - 35 U/L Final  . Total Protein 09/14/2014 7.1  6.0 - 8.3 g/dL Final  . Albumin 09/14/2014 3.8  3.5 - 5.2 g/dL Final  . Calcium 09/14/2014 9.7  8.4 - 10.5 mg/dL Final  . GFR 09/14/2014 69.84  >60.00 mL/min Final  . Cholesterol 09/14/2014 181  0 - 200 mg/dL Final   ATP III Classification       Desirable:  < 200 mg/dL               Borderline High:  200 - 239 mg/dL          High:  > = 240 mg/dL  . Triglycerides 09/14/2014 156.0* 0.0 - 149.0 mg/dL Final   Normal:  <150 mg/dLBorderline High:  150 - 199 mg/dL  . HDL 09/14/2014 75.20  >39.00 mg/dL Final  . VLDL 09/14/2014 31.2  0.0 - 40.0 mg/dL Final  . LDL Cholesterol 09/14/2014 75  0 - 99 mg/dL Final  . Total CHOL/HDL Ratio  09/14/2014 2   Final                  Men          Women1/2 Average Risk     3.4          3.3Average Risk          5.0          4.42X Average Risk          9.6          7.13X Average Risk          15.0          11.0                      . NonHDL 09/14/2014 105.79   Final   NOTE:  Non-HDL goal should be 30 mg/dL higher than patient's LDL goal (i.e. LDL goal of < 70 mg/dL, would have non-HDL goal of < 100 mg/dL)      Medication List       This list is accurate as of: 09/17/14 11:59 PM.  Always use your most recent med list.               aspirin 325 MG EC tablet  Take 325 mg by mouth daily.     cilostazol 100 MG tablet  Commonly known as:  PLETAL  TAKE ONE TABLET BY MOUTH TWICE DAILY     cloNIDine 0.1 MG tablet  Commonly known as:  CATAPRES  TAKE ONE TABLET BY MOUTH TWICE DAILY     gabapentin 100 MG capsule  Commonly known as:  NEURONTIN  TAKE ONE CAPSULE BY MOUTH TWICE DAILY     glucose blood test strip  Commonly known as:  FREESTYLE LITE  Use as instructed to check blood sugar 2 times a day dx code E11.9     insulin regular 100 units/mL injection  Commonly known as:  NOVOLIN R,HUMULIN R  Inject 5-10 Units into the skin 2 (two) times daily before a meal. 3-5 in am and 10 at supper     LEVEMIR 100 UNIT/ML injection  Generic drug:  insulin detemir  INJECT 56 UNITS INTO THE SKIN IN THE MORNING AND 30 UNITS INTO THE SKIN IN THE EVENING.  lisinopril 5 MG tablet  Commonly known as:  PRINIVIL,ZESTRIL  TAKE ONE TABLET BY MOUTH ONCE DAILY     metFORMIN 750 MG 24 hr tablet  Commonly known as:  GLUCOPHAGE-XR  TAKE ONE TABLET BY MOUTH ONCE DAILY     metoCLOPramide 10 MG tablet  Commonly known as:  REGLAN  TAKE ONE TABLET BY MOUTH 4 TIMES DAILY     omeprazole 20 MG capsule  Commonly known as:  PRILOSEC  TAKE ONE CAPSULE BY MOUTH ONCE DAILY (DUE FOR OFFICE VISIT THIS MONTH)     ondansetron 4 MG disintegrating tablet  Commonly known as:  ZOFRAN-ODT     simvastatin 40  MG tablet  Commonly known as:  ZOCOR  TAKE ONE TABLET BY MOUTH ONCE DAILY EVERY EVENING     Vitamin D (Ergocalciferol) 50000 UNITS Caps capsule  Commonly known as:  DRISDOL  TAKE ONE CAPSULE BY MOUTH EVERY 7 DAYS        Allergies:  Allergies  Allergen Reactions  . Morphine And Related Other (See Comments)    Hallucenations   . Penicillins Rash    Swelling    Past Medical History  Diagnosis Date  . Hypertension   . Hyperlipidemia   . Peripheral vascular disease   . Glaucoma   . 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 Stroke     no residual  . Shortness of breath dyspnea     with exdrtion, "Walkling too fast"  . Asthma     No probnlems recently  . Pneumonia 2006  . GERD (gastroesophageal reflux disease)   . Diabetes mellitus     Type II per Dr [name]  (patient said type I)    Past Surgical History  Procedure Laterality Date  . Refractive surgery Bilateral     both eyes  . Abdominal hysterectomy    . Colonoscopy  July 09, 2012  . Spine surgery      lumbar  . Cervical fusion      with graft from hip  . Direct laryngoscopy N/A 06/07/2014    Procedure: DIRECT LARYNGOSCOPY with BIOPSY and EXCISION VOLLECULAR CYST;  Surgeon: Ruby Cola, MD;  Location: [facility] OR;  Service: ENT;  Laterality: N/A;    Family History  Problem Relation Age of Onset  . Cancer Mother   . Heart disease Mother   . Diabetes Father   . Cancer Brother   . Cancer Brother     Social History:  reports that she has been smoking Cigarettes.  She has a 15 pack-year smoking history. She has never used smokeless tobacco. She reports that she does not drink alcohol or use illicit drugs.  Review of Systems:  DYSPHAGIA: resolved after her surgery  APPETITE: This has been significantly less and she is also losing weight.  Has not discussed with PCP TSH was normal earlier on this year  Blindness: She has absent vision on the right side and can see  silhouettes only on the left.  Has been refusing to go to the eye doctor because of the cost   HYPERTENSION:   taking clonidine and lisinopril low dose.  Blood pressure controlled  HYPERLIPIDEMIA: The lipid abnormality consists of elevated LDL controlled with simvastatin.  Her baseline LDL was 202  Lab Results  Component Value Date   CHOL 181 09/14/2014   HDL 75.20 09/14/2014   LDLCALC 75 09/14/2014   TRIG 156.0* 09/14/2014  CHOLHDL 2 09/14/2014    Vitamin D: Taking her 50,000 unit dosage once a month and her level is quite normal now     Examination:   BP 128/72 mmHg  Pulse 87  Temp(Src) 97.8 F (36.6 C)  Resp 16  Ht 5' 3.25" (1.607 m)  Wt 137 lb 3.2 oz (62.234 kg)  BMI 24.10 kg/m2  SpO2 97%  Body mass index is 24.1 kg/(m^2).      ASSESSMENT/ PLAN:   Diabetes type 2   The patient's blood sugars are fairly good as judged by her A1c and home blood sugars Although she is not checking blood sugars often she has not had any significant hypoglycemia She does tend to have low normal blood sugars after supper with a glucose of 33 once  Recommended the following:  Reduce regular insulin by 2 units in the evening  For now will continue her Levemir insulin unchanged as above  She will try to rotate the times when she checks her blood sugar and check more often.  She will call if she has any more low sugars  WEIGHT loss: Recommended that she follow-up with her PCP for further evaluation and also associated decreased appetite.  Meanwhile she can add nutritional supplements in between meals   Patient Instructions  Regular 8 units at supper  Glucerna or diabetic boost    [facility] 09/18/2014, 11:47 AM

## 2014-09-17 NOTE — Patient Instructions (Addendum)
Regular 8 units at supper  Glucerna or diabetic boost

## 2014-10-11 ENCOUNTER — Other Ambulatory Visit: Payer: Self-pay | Admitting: Endocrinology

## 2014-10-13 ENCOUNTER — Ambulatory Visit (INDEPENDENT_AMBULATORY_CARE_PROVIDER_SITE_OTHER): Payer: Medicare Other | Admitting: Podiatry

## 2014-10-13 ENCOUNTER — Encounter: Payer: Self-pay | Admitting: Podiatry

## 2014-10-13 DIAGNOSIS — B351 Tinea unguium: Secondary | ICD-10-CM | POA: Diagnosis not present

## 2014-10-13 DIAGNOSIS — M79676 Pain in unspecified toe(s): Secondary | ICD-10-CM | POA: Diagnosis not present

## 2014-10-13 DIAGNOSIS — E1151 Type 2 diabetes mellitus with diabetic peripheral angiopathy without gangrene: Secondary | ICD-10-CM | POA: Diagnosis not present

## 2014-10-13 DIAGNOSIS — Q828 Other specified congenital malformations of skin: Secondary | ICD-10-CM | POA: Diagnosis not present

## 2014-10-13 NOTE — Progress Notes (Signed)
Patient ID: Julie Brewer, female   DOB: 07-02-1950, 64 y.o.   MRN: [id number]  Subjective: This patient presents for scheduled visit complaining of painful toenails and walking wearing shoes and a painful plantar callus  Objective: The toenails are hypertrophic, elongated, discolored, incurvated and tender to direct palpation 6-10 Nucleated plantar keratoses subsecond MPJ right  Assessment: Symptomatic onychomycoses 6-10 Diabetic with peripheral arterial disease  Plan: Debridement toenails 10 mechanical and electrical without a bleeding Debride porokeratosis 1 without a bleeding  Reappoint 3 months

## 2014-10-13 NOTE — Patient Instructions (Signed)
Diabetes and Foot Care Diabetes may cause you to have problems because of poor blood supply (circulation) to your feet and legs. This may cause the skin on your feet to become thinner, break easier, and heal more slowly. Your skin may become dry, and the skin may peel and crack. You may also have nerve damage in your legs and feet causing decreased feeling in them. You may not notice minor injuries to your feet that could lead to infections or more serious problems. Taking care of your feet is one of the most important things you can do for yourself.  HOME CARE INSTRUCTIONS  Wear shoes at all times, even in the house. Do not go barefoot. Bare feet are easily injured.  Check your feet daily for blisters, cuts, and redness. If you cannot see the bottom of your feet, use a mirror or ask someone for help.  Wash your feet with warm water (do not use hot water) and mild soap. Then pat your feet and the areas between your toes until they are completely dry. Do not soak your feet as this can dry your skin.  Apply a moisturizing lotion or petroleum jelly (that does not contain alcohol and is unscented) to the skin on your feet and to dry, brittle toenails. Do not apply lotion between your toes.  Trim your toenails straight across. Do not dig under them or around the cuticle. File the edges of your nails with an emery board or nail file.  Do not cut corns or calluses or try to remove them with medicine.  Wear clean socks or stockings every day. Make sure they are not too tight. Do not wear knee-high stockings since they may decrease blood flow to your legs.  Wear shoes that fit properly and have enough cushioning. To break in new shoes, wear them for just a few hours a day. This prevents you from injuring your feet. Always look in your shoes before you put them on to be sure there are no objects inside.  Do not cross your legs. This may decrease the blood flow to your feet.  If you find a minor scrape,  cut, or break in the skin on your feet, keep it and the skin around it clean and dry. These areas may be cleansed with mild soap and water. Do not cleanse the area with peroxide, alcohol, or iodine.  When you remove an adhesive bandage, be sure not to damage the skin around it.  If you have a wound, look at it several times a day to make sure it is healing.  Do not use heating pads or hot water bottles. They may burn your skin. If you have lost feeling in your feet or legs, you may not know it is happening until it is too late.  Make sure your health care provider performs a complete foot exam at least annually or more often if you have foot problems. Report any cuts, sores, or bruises to your health care provider immediately. SEEK MEDICAL CARE IF:   You have an injury that is not healing.  You have cuts or breaks in the skin.  You have an ingrown nail.  You notice redness on your legs or feet.  You feel burning or tingling in your legs or feet.  You have pain or cramps in your legs and feet.  Your legs or feet are numb.  Your feet always feel cold. SEEK IMMEDIATE MEDICAL CARE IF:   There is increasing redness,   swelling, or pain in or around a wound.  There is a red line that goes up your leg.  Pus is coming from a wound.  You develop a fever or as directed by your health care provider.  You notice a bad smell coming from an ulcer or wound. Document Released: 01/13/2000 Document Revised: 09/17/2012 Document Reviewed: 06/24/2012 [employer] Patient Information 2015 [employer]. This information is not intended to replace advice given to you by your health care provider. Make sure you discuss any questions you have with your health care provider.  

## 2014-12-08 ENCOUNTER — Other Ambulatory Visit: Payer: Self-pay | Admitting: Surgery

## 2014-12-08 ENCOUNTER — Other Ambulatory Visit: Payer: Self-pay | Admitting: Endocrinology

## 2014-12-08 DIAGNOSIS — I739 Peripheral vascular disease, unspecified: Secondary | ICD-10-CM

## 2014-12-13 ENCOUNTER — Other Ambulatory Visit (INDEPENDENT_AMBULATORY_CARE_PROVIDER_SITE_OTHER): Payer: Medicare Other

## 2014-12-13 DIAGNOSIS — E119 Type 2 diabetes mellitus without complications: Secondary | ICD-10-CM

## 2014-12-13 LAB — BASIC METABOLIC PANEL
BUN: 15 mg/dL (ref 6–23)
CHLORIDE: 106 meq/L (ref 96–112)
CO2: 26 mEq/L (ref 19–32)
Calcium: 9.2 mg/dL (ref 8.4–10.5)
Creatinine, Ser: 1.01 mg/dL (ref 0.40–1.20)
GFR: 71.38 mL/min (ref >60.00)
Glucose, Bld: 135 mg/dL — ABNORMAL HIGH (ref 70–99)
POTASSIUM: 3.8 meq/L (ref 3.5–5.1)
SODIUM: 140 meq/L (ref 135–145)

## 2014-12-13 LAB — HEMOGLOBIN A1C: HEMOGLOBIN A1C: 5.9 % (ref 4.6–6.5)

## 2014-12-16 ENCOUNTER — Encounter: Payer: Self-pay | Admitting: Endocrinology

## 2014-12-16 ENCOUNTER — Ambulatory Visit (INDEPENDENT_AMBULATORY_CARE_PROVIDER_SITE_OTHER): Payer: Medicare Other | Admitting: Endocrinology

## 2014-12-16 VITALS — BP 152/68 | HR 97 | Temp 98.3°F | Resp 16 | Ht 63.25 in | Wt 129.6 lb

## 2014-12-16 DIAGNOSIS — E119 Type 2 diabetes mellitus without complications: Secondary | ICD-10-CM

## 2014-12-16 MED ORDER — VITAMIN D (ERGOCALCIFEROL) 1.25 MG (50000 UNIT) PO CAPS: 50000.0000 [IU] | ORAL_CAPSULE | ORAL | Status: DC

## 2014-12-16 MED ORDER — HYDROCHLOROTHIAZIDE 12.5 MG PO CAPS: 12.5000 mg | ORAL_CAPSULE | Freq: Every day | ORAL | Status: DC

## 2014-12-16 NOTE — Patient Instructions (Signed)
Check blood sugars on waking up 2-3  times a week Also check blood sugars about 2 hours after a meal and do this after different meals by rotation  Recommended blood sugar levels on waking up is 90-130 and about 2 hours after meal is 130-160  Please bring your blood sugar monitor to each visit, thank you  New Rx for BP sent

## 2014-12-16 NOTE — Progress Notes (Signed)
Patient ID: Julie Brewer, female   DOB: [birth year], 64 y.o.   MRN: [id number]   Reason for Appointment:   History of Present Illness   Diagnosis: Type 2 DIABETES MELITUS, date of diagnosis:  1985  Prior history: She has been on insulin since diagnosis and on Lantus previously Also at some point had been started on Glucophage several years ago also Because of insurance preference Lantus was changed to Levemir  She refuses to use analog rapid acting insulin because of cost and is using regular insulin for several years   Her blood sugars are generally well controlled and A1c usually under 7%  Recent history:   Insulin regimen: Levemir insulin 35 in the morning once daily. Regular insulin 3-5 units a.m. and 8 before supper   She did not bring her monitor for review today again and not clear what her blood sugar patterns are. A1c is about the same as before and still upper normal  Current blood sugar patterns, management and problems:  She is not checking blood sugars much and is dependent on her daughter to check sugar and this is sporadic.  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          Monitors blood glucose: Less than once a day Glucometer:  FreeStyle     Blood Glucose readings ?  Am 74, 90; at 2-3 pm 80-90 pcs 120-150  Meals:  usually 2 meals per day at 10 AM and 5 PM.    Mealtime protein sources: Egg, [place], chicken. Sweet drinks consumed only occasionally  Physical activity: exercise: Just walking within the house               Microalbumin has been persistently high previously, was normal at 26 in 9/15  Wt Readings from Last 3 Encounters:  12/16/14 129 lb 9.6 oz (58.786 kg)  09/17/14 137 lb 3.2 oz (62.234 kg)  09/06/14 140 lb (63.504 kg)   Lab Results  Component Value Date   HGBA1C 5.9 12/13/2014   HGBA1C 5.9 09/14/2014   HGBA1C 5.8 05/31/2014   Lab Results  Component Value Date   MICROALBUR 95.8* 05/31/2014   [place] 75 09/14/2014   CREATININE 1.01 12/13/2014     Appointment on 12/13/2014  Component Date Value Ref Range Status  . Hgb A1c MFr Bld 12/13/2014 5.9  4.6 - 6.5 % Final   Glycemic Control Guidelines for People with Diabetes:Non Diabetic:  <6%Goal of Therapy: <7%Additional Action Suggested:  >8%   . Sodium 12/13/2014 140  135 - 145 mEq/L Final  . Potassium 12/13/2014 3.8  3.5 - 5.1 mEq/L Final  . Chloride 12/13/2014 106  96 - 112 mEq/L Final  . CO2 12/13/2014 26  19 - 32 mEq/L Final  . Glucose, Bld 12/13/2014 135* 70 - 99 mg/dL Final  . BUN 12/13/2014 15  6 - 23 mg/dL Final  . Creatinine, Ser 12/13/2014 1.01  0.40 - 1.20 mg/dL Final  . Calcium 12/13/2014 9.2  8.4 - 10.5 mg/dL  Final  . GFR 12/13/2014 71.38  >60.00 mL/min Final      Medication List       This list is accurate as of: 12/16/14  8:46 PM.  Always use your most recent med list.               aspirin 325 MG EC tablet  Take 325 mg by mouth daily.     cilostazol 100 MG tablet  Commonly known as:  PLETAL  TAKE ONE TABLET BY MOUTH TWICE DAILY     cloNIDine 0.1 MG tablet  Commonly known as:  CATAPRES  TAKE ONE TABLET BY MOUTH TWICE DAILY     gabapentin 100 MG capsule  Commonly known as:  NEURONTIN  TAKE ONE CAPSULE BY MOUTH TWICE DAILY     glucose blood test strip  Commonly known as:  FREESTYLE LITE  Use as instructed to check blood sugar 2 times a day dx code E11.9     hydrochlorothiazide 12.5 MG capsule  Commonly known as:  MICROZIDE  Take 1 capsule (12.5 mg total) by mouth daily.     insulin regular  100 units/mL injection  Commonly known as:  NOVOLIN R,HUMULIN R  Inject 5-10 Units into the skin 2 (two) times daily before a meal. 5 in am and 8 at supper     LEVEMIR 100 UNIT/ML injection  Generic drug:  insulin detemir  INJECT 56 UNITS INTO THE SKIN IN THE MORNING AND 30 UNITS INTO THE SKIN IN THE EVENING.     lisinopril 5 MG tablet  Commonly known as:  PRINIVIL,ZESTRIL  TAKE ONE TABLET BY MOUTH ONCE DAILY     metFORMIN 750 MG 24 hr tablet  Commonly known as:  GLUCOPHAGE-XR  TAKE ONE TABLET BY MOUTH ONCE DAILY     metoCLOPramide 10 MG tablet  Commonly known as:  REGLAN  TAKE ONE TABLET BY MOUTH 4 TIMES DAILY     omeprazole 20 MG capsule  Commonly known as:  PRILOSEC  TAKE ONE CAPSULE BY MOUTH ONCE DAILY (DUE FOR OFFICE VISIT THIS MONTH)     ondansetron 4 MG disintegrating tablet  Commonly known as:  ZOFRAN-ODT     simvastatin 40 MG tablet  Commonly known as:  ZOCOR  TAKE ONE TABLET BY MOUTH ONCE DAILY EVERY EVENING     simvastatin 40 MG tablet  Commonly known as:  ZOCOR  TAKE ONE TABLET BY MOUTH ONCE DAILY IN THE EVENING.     Vitamin D (Ergocalciferol) 50000 UNITS Caps capsule  Commonly known as:  DRISDOL  Take 1 capsule (50,000 Units total) by mouth every 7 (seven) days.        Allergies:  Allergies  Allergen Reactions  . Morphine And Related Other (See Comments)    Hallucenations   . Penicillins Rash    Swelling    Past Medical History  Diagnosis Date  . Hypertension   . Hyperlipidemia   . Peripheral vascular disease ([place])   . Glaucoma   . 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 Stroke [facility])     no residual  . Shortness of breath dyspnea     with exdrtion, "Walkling too fast"  . Asthma     No probnlems recently  . Pneumonia 2006  . GERD (gastroesophageal reflux disease)   . Diabetes mellitus     Type II per Dr [name]  (patient said type I)    Past  Surgical History  Procedure Laterality Date    . Refractive surgery Bilateral     both eyes  . Abdominal hysterectomy    . Colonoscopy  July 09, 2012  . Spine surgery      lumbar  . Cervical fusion      with graft from hip  . Direct laryngoscopy N/A 06/07/2014    Procedure: DIRECT LARYNGOSCOPY with BIOPSY and EXCISION VOLLECULAR CYST;  Surgeon: Ruby Cola, MD;  Location: [facility];  Service: ENT;  Laterality: N/A;    Family History  Problem Relation Age of Onset  . Cancer Mother   . Heart disease Mother   . Diabetes Father   . Cancer Brother   . Cancer Brother     Social History:  reports that she has been smoking Cigarettes.  She has a 15 pack-year smoking history. She has never used smokeless tobacco. She reports that she does not drink alcohol or use illicit drugs.  Review of Systems:  WEIGHT loss:: This has been progressive and she still has not gone back to her PCP for evaluation.  Has decreased appetite  Blindness: She has absent vision on the right side and can see silhouettes only on the left.  Has been refusing to go to the eye doctor because of the cost   HYPERTENSION:   taking clonidine and lisinopril low dose.  Blood pressure not controlled today even though she is compliant with her medication  HYPERLIPIDEMIA: The lipid abnormality consists of elevated LDL controlled with simvastatin.  Her baseline LDL was 202  Lab Results  Component Value Date   CHOL 181 09/14/2014   HDL 75.20 09/14/2014   LDLCALC 75 09/14/2014   TRIG 156.0* 09/14/2014   CHOLHDL 2 09/14/2014    Vitamin D: Taking her 50,000 unit dosage once a month and her level is quite normal     last diabetic foot exam was done in 08/2014   Examination:   BP 152/68 mmHg  Pulse 97  Temp(Src) 98.3 F (36.8 C)  Resp 16  Ht 5' 3.25" (1.607 m)  Wt 129 lb 9.6 oz (58.786 kg)  BMI 22.76 kg/m2  SpO2 97%  Body mass index is 22.76 kg/(m^2).    No pedal edema present  ASSESSMENT/ PLAN:   Diabetes type 2  See history of present illness for detailed  discussion of his current management, blood sugar patterns and problems identified  The patient's blood sugars are fairly good as judged by her A1c Her blood sugars are difficult to evaluate as she is checking infrequently and not monitoring at various times as directed. She does not remember her blood sugars and again did not bring her monitor No reported hypoglycemia recently with her regimen of low-dose regular insulin twice a day and Levemir once a day in the morning  HYPERTENSION: Systolic readings are significantly higher than before and she will start taking HCTZ also She has a history of microalbuminuria  WEIGHT loss: Recommended that she follow-up with her PCP for further evaluation and also associated decreased appetite.    Patient Instructions  Check blood sugars on waking up 2-3  times a week Also check blood sugars about 2 hours after a meal and do this after different meals by rotation  Recommended blood sugar levels on waking up is 90-130 and about 2 hours after meal is 130-160  Please bring your blood sugar monitor to each visit, thank you  New Rx for BP sent     [facility] 12/16/2014, 8:46 PM

## 2015-01-10 ENCOUNTER — Other Ambulatory Visit: Payer: Self-pay | Admitting: Endocrinology

## 2015-01-12 ENCOUNTER — Ambulatory Visit (INDEPENDENT_AMBULATORY_CARE_PROVIDER_SITE_OTHER): Payer: Medicare Other | Admitting: Podiatry

## 2015-01-12 ENCOUNTER — Encounter: Payer: Self-pay | Admitting: Podiatry

## 2015-01-12 DIAGNOSIS — M79676 Pain in unspecified toe(s): Secondary | ICD-10-CM | POA: Diagnosis not present

## 2015-01-12 DIAGNOSIS — B351 Tinea unguium: Secondary | ICD-10-CM | POA: Diagnosis not present

## 2015-01-12 DIAGNOSIS — E1151 Type 2 diabetes mellitus with diabetic peripheral angiopathy without gangrene: Secondary | ICD-10-CM

## 2015-01-12 DIAGNOSIS — Q828 Other specified congenital malformations of skin: Secondary | ICD-10-CM | POA: Diagnosis not present

## 2015-01-12 NOTE — Patient Instructions (Signed)
Diabetes and Foot Care Diabetes may cause you to have problems because of poor blood supply (circulation) to your feet and legs. This may cause the skin on your feet to become thinner, break easier, and heal more slowly. Your skin may become dry, and the skin may peel and crack. You may also have nerve damage in your legs and feet causing decreased feeling in them. You may not notice minor injuries to your feet that could lead to infections or more serious problems. Taking care of your feet is one of the most important things you can do for yourself.  HOME CARE INSTRUCTIONS  Wear shoes at all times, even in the house. Do not go barefoot. Bare feet are easily injured.  Check your feet daily for blisters, cuts, and redness. If you cannot see the bottom of your feet, use a mirror or ask someone for help.  Wash your feet with warm water (do not use hot water) and mild soap. Then pat your feet and the areas between your toes until they are completely dry. Do not soak your feet as this can dry your skin.  Apply a moisturizing lotion or petroleum jelly (that does not contain alcohol and is unscented) to the skin on your feet and to dry, brittle toenails. Do not apply lotion between your toes.  Trim your toenails straight across. Do not dig under them or around the cuticle. File the edges of your nails with an emery board or nail file.  Do not cut corns or calluses or try to remove them with medicine.  Wear clean socks or stockings every day. Make sure they are not too tight. Do not wear knee-high stockings since they may decrease blood flow to your legs.  Wear shoes that fit properly and have enough cushioning. To break in new shoes, wear them for just a few hours a day. This prevents you from injuring your feet. Always look in your shoes before you put them on to be sure there are no objects inside.  Do not cross your legs. This may decrease the blood flow to your feet.  If you find a minor scrape,  cut, or break in the skin on your feet, keep it and the skin around it clean and dry. These areas may be cleansed with mild soap and water. Do not cleanse the area with peroxide, alcohol, or iodine.  When you remove an adhesive bandage, be sure not to damage the skin around it.  If you have a wound, look at it several times a day to make sure it is healing.  Do not use heating pads or hot water bottles. They may burn your skin. If you have lost feeling in your feet or legs, you may not know it is happening until it is too late.  Make sure your health care provider performs a complete foot exam at least annually or more often if you have foot problems. Report any cuts, sores, or bruises to your health care provider immediately. SEEK MEDICAL CARE IF:   You have an injury that is not healing.  You have cuts or breaks in the skin.  You have an ingrown nail.  You notice redness on your legs or feet.  You feel burning or tingling in your legs or feet.  You have pain or cramps in your legs and feet.  Your legs or feet are numb.  Your feet always feel cold. SEEK IMMEDIATE MEDICAL CARE IF:   There is increasing redness,   swelling, or pain in or around a wound.  There is a red line that goes up your leg.  Pus is coming from a wound.  You develop a fever or as directed by your health care provider.  You notice a bad smell coming from an ulcer or wound.   This information is not intended to replace advice given to you by your health care provider. Make sure you discuss any questions you have with your health care provider.   Document Released: 01/13/2000 Document Revised: 09/17/2012 Document Reviewed: 06/24/2012 Elsevier Interactive Patient Education 2016 [employer]  

## 2015-01-13 NOTE — Progress Notes (Signed)
Patient ID: Julie Brewer, female   DOB: 1950-08-10, 64 y.o.   MRN: [id number]  Subjective: This patient presents again for a scheduled visit complaining of thickened and elongated toenails which are uncomfortable walking wearing shoes as well as a painful plantar callus on the right foot. She presents for debridement of these nails and painful plantar callus  Objective: No open skin lesions bilaterally The toenails are hypertrophic, elongated, discolored, deformed and tender direct palpation 6-10 Plantar nucleated keratoses subsecond MPJ right  Assessment: Symptomatic onychomycoses 6-10 Keratoses/porokeratosis second MPJ right Diabetic with peripheral arterial disease  Plan: Debridement toenails 10 and mechanically and electrically without any bleeding Debride plantar keratoses 1 without any bleeding  Reappoint 3 months

## 2015-03-15 ENCOUNTER — Other Ambulatory Visit (INDEPENDENT_AMBULATORY_CARE_PROVIDER_SITE_OTHER): Payer: Medicare Other

## 2015-03-15 DIAGNOSIS — E119 Type 2 diabetes mellitus without complications: Secondary | ICD-10-CM | POA: Diagnosis not present

## 2015-03-15 LAB — URINALYSIS, ROUTINE W REFLEX MICROSCOPIC
BILIRUBIN URINE: NEGATIVE
HGB URINE DIPSTICK: NEGATIVE
Ketones, ur: NEGATIVE
Leukocytes, UA: NEGATIVE
NITRITE: NEGATIVE
RBC / HPF: NONE SEEN (ref 0–?)
Specific Gravity, Urine: 1.01 (ref 1.000–1.030)
Total Protein, Urine: 100 — AB
URINE GLUCOSE: NEGATIVE
Urobilinogen, UA: 0.2 (ref 0.0–1.0)
pH: 6 (ref 5.0–8.0)

## 2015-03-15 LAB — COMPREHENSIVE METABOLIC PANEL
ALBUMIN: 3.6 g/dL (ref 3.5–5.2)
ALT: 8 U/L (ref 0–35)
AST: 12 U/L (ref 0–37)
Alkaline Phosphatase: 57 U/L (ref 39–117)
BUN: 20 mg/dL (ref 6–23)
CHLORIDE: 106 meq/L (ref 96–112)
CO2: 25 meq/L (ref 19–32)
CREATININE: 1.24 mg/dL — AB (ref 0.40–1.20)
Calcium: 9.1 mg/dL (ref 8.4–10.5)
GFR: 56.29 mL/min — ABNORMAL LOW (ref >60.00)
GLUCOSE: 142 mg/dL — AB (ref 70–99)
Potassium: 3.9 mEq/L (ref 3.5–5.1)
SODIUM: 138 meq/L (ref 135–145)
Total Bilirubin: 0.3 mg/dL (ref 0.2–1.2)
Total Protein: 6.1 g/dL (ref 6.0–8.3)

## 2015-03-15 LAB — MICROALBUMIN / CREATININE URINE RATIO
Creatinine,U: 64.9 mg/dL
Microalb Creat Ratio: 118.5 mg/g — ABNORMAL HIGH (ref 0.0–30.0)
Microalb, Ur: 76.9 mg/dL — ABNORMAL HIGH (ref 0.0–1.9)

## 2015-03-15 LAB — HEMOGLOBIN A1C: HEMOGLOBIN A1C: 6.3 % (ref 4.6–6.5)

## 2015-03-18 ENCOUNTER — Ambulatory Visit (INDEPENDENT_AMBULATORY_CARE_PROVIDER_SITE_OTHER): Payer: Medicare Other | Admitting: Endocrinology

## 2015-03-18 ENCOUNTER — Encounter: Payer: Self-pay | Admitting: Endocrinology

## 2015-03-18 VITALS — BP 136/68 | HR 94 | Temp 97.8°F | Resp 14 | Ht 63.25 in | Wt 139.8 lb

## 2015-03-18 DIAGNOSIS — E1129 Type 2 diabetes mellitus with other diabetic kidney complication: Secondary | ICD-10-CM | POA: Diagnosis not present

## 2015-03-18 DIAGNOSIS — R809 Proteinuria, unspecified: Secondary | ICD-10-CM

## 2015-03-18 DIAGNOSIS — Z794 Long term (current) use of insulin: Secondary | ICD-10-CM | POA: Diagnosis not present

## 2015-03-18 DIAGNOSIS — I10 Essential (primary) hypertension: Secondary | ICD-10-CM

## 2015-03-18 DIAGNOSIS — E1165 Type 2 diabetes mellitus with hyperglycemia: Secondary | ICD-10-CM | POA: Diagnosis not present

## 2015-03-18 NOTE — Patient Instructions (Signed)
Check blood sugars on waking up  2-3  times a week Also check blood sugars about 2 hours after a meal and do this after different meals by rotation  Recommended blood sugar levels on waking up is 90-130 and about 2 hours after meal is 130-160  Please bring your blood sugar monitor to each visit, thank you  Same insulin

## 2015-03-18 NOTE — Progress Notes (Signed)
Patient ID: Julie Brewer, female   DOB: 1950/09/21, 65 y.o.   MRN: [id number]   Reason for Appointment:   History of Present Illness   Diagnosis: Type 2 DIABETES MELITUS, date of diagnosis:  1985  Prior history: She has been on insulin since diagnosis and on Lantus previously Also at some point had been started on Glucophage several years ago also Because of insurance preference Lantus was changed to Levemir  She refuses to use analog rapid acting insulin because of cost and is using regular insulin for several years   Her blood sugars are generally well controlled and A1c usually under 7%  Recent history:   Insulin regimen: Levemir insulin 35 in the morning once daily. Regular insulin 3-5 units a.m. and 8 before supper   Her A1c is stable in the upper normal range  Current blood sugar patterns, management and problems:  She is not checking blood sugars much and probably none in the last month or so  She has only a couple of readings from December and January and most of these are okay except reading of 350 in December, readings are usually not below 100.  Unclear what readings are recent as her monitor has incorrect date and time programmed on it  She is very compliant with her insulin including regular insulin 30 measures before meals  Fasting glucose 142 in the lab  She does not think she has any hypoglycemia now, however usually does not have symptoms   She continues to be on low dose metformin ER  Oral hypoglycemic drugs: Metformin ER 750 at supper       Side effects from medications: None Proper timing of medications in relation to meals: Yes, 30 min ac.          Monitors blood glucose: Less than once a day Glucometer:  FreeStyle     Blood Glucose readings ?   Meals:  usually 2 meals per day at 10 AM and 5 PM.    Mealtime protein sources: Egg, [place], chicken. Sweet drinks not consumed  Physical activity: exercise: Just walking within the house               Microalbumin has been persistently high previously, was normal at 26 in 9/15  Wt Readings from Last 3 Encounters:  03/18/15 139 lb 12.8 oz (63.413 kg)  12/16/14 129 lb 9.6 oz (58.786 kg)  09/17/14 137 lb 3.2 oz (62.234 kg)   Lab Results  Component Value Date   HGBA1C 6.3 03/15/2015   HGBA1C 5.9 12/13/2014   HGBA1C 5.9 09/14/2014   Lab Results  Component Value Date   MICROALBUR 76.9* 03/15/2015   LDLCALC 75 09/14/2014   CREATININE 1.24* 03/15/2015        Lab on 03/15/2015  Component Date Value Ref Range Status  . Sodium 03/15/2015 138  135 - 145 mEq/L Final  . Potassium 03/15/2015 3.9  3.5 - 5.1 mEq/L Final  . Chloride 03/15/2015 106  96 - 112 mEq/L Final  . CO2 03/15/2015 25  19 - 32 mEq/L Final  . Glucose, Bld 03/15/2015 142* 70 - 99 mg/dL Final  . BUN 03/15/2015 20  6 - 23 mg/dL Final  . Creatinine, Ser 03/15/2015 1.24* 0.40 - 1.20 mg/dL Final  . Total Bilirubin 03/15/2015 0.3  0.2 - 1.2 mg/dL Final  . Alkaline Phosphatase 03/15/2015 57  39 - 117 U/L Final  . AST 03/15/2015 12  0 - 37 U/L Final  . ALT 03/15/2015  8  0 - 35 U/L Final  . Total Protein 03/15/2015 6.1  6.0 - 8.3 g/dL Final  . Albumin 03/15/2015 3.6  3.5 - 5.2 g/dL Final  . Calcium 03/15/2015 9.1  8.4 - 10.5 mg/dL Final  . GFR 03/15/2015 56.29* >60.00 mL/min Final  . Hgb A1c MFr Bld 03/15/2015 6.3  4.6 - 6.5 % Final   Glycemic Control Guidelines for People with Diabetes:Non Diabetic:  <6%Goal of Therapy: <7%Additional Action Suggested:  >8%   . Microalb, Ur 03/15/2015 76.9* 0.0 - 1.9 mg/dL Final  . Creatinine,U 03/15/2015 64.9   Final  . Microalb Creat Ratio 03/15/2015 118.5* 0.0 - 30.0 mg/g Final  . Color, Urine 03/15/2015 YELLOW  Yellow;Lt. Yellow Final  . APPearance 03/15/2015 CLEAR  Clear Final  . Specific Gravity, Urine 03/15/2015 1.010  1.000-1.030 Final  . pH 03/15/2015 6.0  5.0 - 8.0 Final  . Total Protein, Urine 03/15/2015 100* Negative Final  . Urine Glucose 03/15/2015 NEGATIVE   Negative Final  . Ketones, ur 03/15/2015 NEGATIVE  Negative Final  . Bilirubin Urine 03/15/2015 NEGATIVE  Negative Final  . Hgb urine dipstick 03/15/2015 NEGATIVE  Negative Final  . Urobilinogen, UA 03/15/2015 0.2  0.0 - 1.0 Final  . Leukocytes, UA 03/15/2015 NEGATIVE  Negative Final  . Nitrite 03/15/2015 NEGATIVE  Negative Final  . WBC, UA 03/15/2015 0-2/hpf  0-2/hpf Final  . RBC / HPF 03/15/2015 none seen  0-2/hpf Final  . Squamous Epithelial / LPF 03/15/2015 Rare(0-4/hpf)  Rare(0-4/hpf) Final      Medication List       This list is accurate as of: 03/18/15 10:50 AM.  Always use your most recent med list.               aspirin 325 MG EC tablet  Take 325 mg by mouth daily.     cilostazol 100 MG tablet  Commonly known as:  PLETAL  TAKE ONE TABLET BY MOUTH TWICE DAILY     cloNIDine 0.1 MG tablet  Commonly known as:  CATAPRES  TAKE ONE TABLET BY MOUTH TWICE DAILY     gabapentin 100 MG capsule  Commonly known as:  NEURONTIN  TAKE ONE CAPSULE BY MOUTH TWICE DAILY     glucose blood test strip  Commonly known as:  FREESTYLE LITE  Use as instructed to check blood sugar 2 times a day dx code E11.9     insulin regular 100 units/mL injection  Commonly known as:  NOVOLIN R,HUMULIN R  Inject 5-8 Units into the skin 2 (two) times daily before a meal. 5 in am and 8 at supper     LEVEMIR 100 UNIT/ML injection  Generic drug:  insulin detemir  INJECT 56 UNITS INTO THE SKIN IN THE MORNING AND 30 UNITS INTO THE SKIN IN THE EVENING.     lisinopril 10 MG tablet  Commonly known as:  PRINIVIL,ZESTRIL  Take 10 mg by mouth daily.     metFORMIN 750 MG 24 hr tablet  Commonly known as:  GLUCOPHAGE-XR  TAKE ONE TABLET BY MOUTH ONCE DAILY     metoCLOPramide 10 MG tablet  Commonly known as:  REGLAN  TAKE ONE TABLET BY MOUTH 4 TIMES DAILY     omeprazole 20 MG capsule  Commonly known as:  PRILOSEC  TAKE ONE CAPSULE BY MOUTH ONCE DAILY (DUE FOR OFFICE VISIT THIS MONTH)     ondansetron 4  MG disintegrating tablet  Commonly known as:  ZOFRAN-ODT     simvastatin 40 MG  tablet  Commonly known as:  ZOCOR  TAKE ONE TABLET BY MOUTH ONCE DAILY EVERY EVENING     simvastatin 40 MG tablet  Commonly known as:  ZOCOR  TAKE ONE TABLET BY MOUTH ONCE DAILY IN THE EVENING.     Vitamin D (Ergocalciferol) 50000 units Caps capsule  Commonly known as:  DRISDOL  Take 1 capsule (50,000 Units total) by mouth every 7 (seven) days.        Allergies:  Allergies  Allergen Reactions  . Morphine And Related Other (See Comments)    Hallucenations   . Penicillins Rash    Swelling    Past Medical History  Diagnosis Date  . Hypertension   . Hyperlipidemia   . Peripheral vascular disease ([place])   . Glaucoma   . 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 Stroke [facility])     no residual  . Shortness of breath dyspnea     with exdrtion, "Walkling too fast"  . Asthma     No probnlems recently  . Pneumonia 2006  . GERD (gastroesophageal reflux disease)   . Diabetes mellitus     Type II per Dr [name]  (patient said type I)    Past Surgical History  Procedure Laterality Date  . Refractive surgery Bilateral     both eyes  . Abdominal hysterectomy    . Colonoscopy  July 09, 2012  . Spine surgery      lumbar  . Cervical fusion      with graft from hip  . Direct laryngoscopy N/A 06/07/2014    Procedure: DIRECT LARYNGOSCOPY with BIOPSY and EXCISION VOLLECULAR CYST;  Surgeon: Ruby Cola, MD;  Location: [facility] OR;  Service: ENT;  Laterality: N/A;    Family History  Problem Relation Age of Onset  . Cancer Mother   . Heart disease Mother   . Diabetes Father   . Cancer Brother   . Cancer Brother     Social History:  reports that she has been smoking Cigarettes.  She has a 15 pack-year smoking history. She has never used smokeless tobacco. She reports that she does not drink alcohol or use illicit drugs.  Review of Systems:   Blindness: She  has absent vision on the right side and can see silhouettes only on the left.  Has been refusing to go to the eye doctor because of the cost   HYPERTENSION:   taking clonidine and lisinopril 10 mg dose.   Her HCTZ was stopped by PCP and lisinopril increased to 10 mg; her creatinine is relatively higher than before  Now appears to have a high urine microalbumin  Lab Results  Component Value Date   CREATININE 1.24* 03/15/2015   BUN 20 03/15/2015   NA 138 03/15/2015   K 3.9 03/15/2015   CL 106 03/15/2015   CO2 25 03/15/2015     HYPERLIPIDEMIA: The lipid abnormality consists of elevated LDL controlled with simvastatin.  Her baseline LDL was 202  Lab Results  Component Value Date   CHOL 181 09/14/2014   HDL 75.20 09/14/2014   LDLCALC 75 09/14/2014   TRIG 156.0* 09/14/2014   CHOLHDL 2 09/14/2014    Vitamin D: Taking her 50,000 unit dosage once a month     Last diabetic foot exam was done in 08/2014   Examination:   BP 136/68 mmHg  Pulse 94  Temp(Src) 97.8 F (36.6 C)  Resp 14  Ht 5' 3.25" (1.607 m)  Wt 139 lb 12.8 oz (63.413 kg)  BMI 24.56 kg/m2  SpO2 97%  Body mass index is 24.56 kg/(m^2).    No pedal edema present  Repeat blood pressure 138/66 left arm   ASSESSMENT/ PLAN:   Diabetes type 2  See history of present illness for detailed discussion of his current management, blood sugar patterns and problems identified  The patient's blood sugars are fairly well controlled as judged by her A1c Her blood sugars are difficult to evaluate as she is checking infrequently and not clear if her monitor has the correct date programmed No hypoglycemia reported She has improved her weight but is not eating larger meals  She will continue the same regimen for now She was reminded her caretaker to check her blood sugars on a regular basis  HYPERTENSION: Now better controlled Continue lisinopril and clonidine Periodically follow microalbumin Will not increase her  lisinopril as yet since creatinine is slightly higher than usual       Patient Instructions  Check blood sugars on waking up  2-3  times a week Also check blood sugars about 2 hours after a meal and do this after different meals by rotation  Recommended blood sugar levels on waking up is 90-130 and about 2 hours after meal is 130-160  Please bring your blood sugar monitor to each visit, thank you  Same insulin     Rainbow Salman 03/18/2015, 10:50 AM

## 2015-04-20 ENCOUNTER — Ambulatory Visit (INDEPENDENT_AMBULATORY_CARE_PROVIDER_SITE_OTHER): Payer: Medicare Other | Admitting: Podiatry

## 2015-04-20 ENCOUNTER — Encounter: Payer: Self-pay | Admitting: Podiatry

## 2015-04-20 DIAGNOSIS — E1151 Type 2 diabetes mellitus with diabetic peripheral angiopathy without gangrene: Secondary | ICD-10-CM

## 2015-04-20 DIAGNOSIS — B351 Tinea unguium: Secondary | ICD-10-CM | POA: Diagnosis not present

## 2015-04-20 DIAGNOSIS — Q828 Other specified congenital malformations of skin: Secondary | ICD-10-CM

## 2015-04-20 DIAGNOSIS — M79676 Pain in unspecified toe(s): Secondary | ICD-10-CM

## 2015-04-20 NOTE — Patient Instructions (Signed)
Diabetes and Foot Care Diabetes may cause you to have problems because of poor blood supply (circulation) to your feet and legs. This may cause the skin on your feet to become thinner, break easier, and heal more slowly. Your skin may become dry, and the skin may peel and crack. You may also have nerve damage in your legs and feet causing decreased feeling in them. You may not notice minor injuries to your feet that could lead to infections or more serious problems. Taking care of your feet is one of the most important things you can do for yourself.  HOME CARE INSTRUCTIONS  Wear shoes at all times, even in the house. Do not go barefoot. Bare feet are easily injured.  Check your feet daily for blisters, cuts, and redness. If you cannot see the bottom of your feet, use a mirror or ask someone for help.  Wash your feet with warm water (do not use hot water) and mild soap. Then pat your feet and the areas between your toes until they are completely dry. Do not soak your feet as this can dry your skin.  Apply a moisturizing lotion or petroleum jelly (that does not contain alcohol and is unscented) to the skin on your feet and to dry, brittle toenails. Do not apply lotion between your toes.  Trim your toenails straight across. Do not dig under them or around the cuticle. File the edges of your nails with an emery board or nail file.  Do not cut corns or calluses or try to remove them with medicine.  Wear clean socks or stockings every day. Make sure they are not too tight. Do not wear knee-high stockings since they may decrease blood flow to your legs.  Wear shoes that fit properly and have enough cushioning. To break in new shoes, wear them for just a few hours a day. This prevents you from injuring your feet. Always look in your shoes before you put them on to be sure there are no objects inside.  Do not cross your legs. This may decrease the blood flow to your feet.  If you find a minor scrape,  cut, or break in the skin on your feet, keep it and the skin around it clean and dry. These areas may be cleansed with mild soap and water. Do not cleanse the area with peroxide, alcohol, or iodine.  When you remove an adhesive bandage, be sure not to damage the skin around it.  If you have a wound, look at it several times a day to make sure it is healing.  Do not use heating pads or hot water bottles. They may burn your skin. If you have lost feeling in your feet or legs, you may not know it is happening until it is too late.  Make sure your health care provider performs a complete foot exam at least annually or more often if you have foot problems. Report any cuts, sores, or bruises to your health care provider immediately. SEEK MEDICAL CARE IF:   You have an injury that is not healing.  You have cuts or breaks in the skin.  You have an ingrown nail.  You notice redness on your legs or feet.  You feel burning or tingling in your legs or feet.  You have pain or cramps in your legs and feet.  Your legs or feet are numb.  Your feet always feel cold. SEEK IMMEDIATE MEDICAL CARE IF:   There is increasing redness,   swelling, or pain in or around a wound.  There is a red line that goes up your leg.  Pus is coming from a wound.  You develop a fever or as directed by your health care provider.  You notice a bad smell coming from an ulcer or wound.   This information is not intended to replace advice given to you by your health care provider. Make sure you discuss any questions you have with your health care provider.   Document Released: 01/13/2000 Document Revised: 09/17/2012 Document Reviewed: 06/24/2012 Elsevier Interactive Patient Education 2016 [employer]  

## 2015-04-21 NOTE — Progress Notes (Signed)
Patient ID: Julie Brewer, female   DOB: 09-22-1950, 65 y.o.   MRN: [id number]  Subjective: This patient presents again for a scheduled visit complaining of thickened and elongated toenails which are uncomfortable walking wearing shoes as well as a painful plantar callus on the right foot. She presents for debridement of these nails and painful plantar callus  Objective: No open skin lesions bilaterally The toenails are hypertrophic, elongated, discolored, deformed and tender direct palpation 6-10 Plantar nucleated keratoses subsecond MPJ right  Assessment: Symptomatic onychomycoses 6-10 Keratoses/porokeratosis second MPJ right Diabetic with peripheral arterial disease  Plan: Debridement toenails 10 and mechanically and electrically without any bleeding Debride plantar keratoses 1 without any bleeding  Reappoint 3 months

## 2015-04-22 ENCOUNTER — Other Ambulatory Visit: Payer: Self-pay

## 2015-04-22 DIAGNOSIS — Z1231 Encounter for screening mammogram for malignant neoplasm of breast: Secondary | ICD-10-CM

## 2015-05-05 ENCOUNTER — Other Ambulatory Visit: Payer: Self-pay | Admitting: Endocrinology

## 2015-05-12 ENCOUNTER — Ambulatory Visit
Admission: RE | Admit: 2015-05-12 | Discharge: 2015-05-12 | Disposition: A | Discharge status: Home / Self Care | Payer: Medicare Other | Source: Ambulatory Visit

## 2015-05-12 DIAGNOSIS — Z1231 Encounter for screening mammogram for malignant neoplasm of breast: Secondary | ICD-10-CM

## 2015-06-04 ENCOUNTER — Other Ambulatory Visit: Payer: Self-pay | Admitting: Endocrinology

## 2015-06-07 ENCOUNTER — Other Ambulatory Visit: Payer: Self-pay | Admitting: Endocrinology

## 2015-06-14 ENCOUNTER — Other Ambulatory Visit: Payer: Self-pay | Admitting: Endocrinology

## 2015-06-14 ENCOUNTER — Other Ambulatory Visit (INDEPENDENT_AMBULATORY_CARE_PROVIDER_SITE_OTHER): Payer: Medicare Other

## 2015-06-14 DIAGNOSIS — Z794 Long term (current) use of insulin: Secondary | ICD-10-CM

## 2015-06-14 DIAGNOSIS — E1165 Type 2 diabetes mellitus with hyperglycemia: Secondary | ICD-10-CM

## 2015-06-14 LAB — COMPREHENSIVE METABOLIC PANEL
ALT: 9 U/L (ref 0–35)
AST: 13 U/L (ref 0–37)
Albumin: 3.6 g/dL (ref 3.5–5.2)
Alkaline Phosphatase: 58 U/L (ref 39–117)
BILIRUBIN TOTAL: 0.2 mg/dL (ref 0.2–1.2)
BUN: 23 mg/dL (ref 6–23)
CHLORIDE: 109 meq/L (ref 96–112)
CO2: 21 mEq/L (ref 19–32)
CREATININE: 1.27 mg/dL — AB (ref 0.40–1.20)
Calcium: 9.3 mg/dL (ref 8.4–10.5)
GFR: 54.71 mL/min — ABNORMAL LOW (ref >60.00)
GLUCOSE: 120 mg/dL — AB (ref 70–99)
Potassium: 4.5 mEq/L (ref 3.5–5.1)
SODIUM: 137 meq/L (ref 135–145)
Total Protein: 6.3 g/dL (ref 6.0–8.3)

## 2015-06-14 LAB — LIPID PANEL
CHOL/HDL RATIO: 2
Cholesterol: 158 mg/dL (ref 0–200)
HDL: 72.6 mg/dL (ref >39.00)
LDL CALC: 59 mg/dL (ref 0–99)
NONHDL: 85.25
Triglycerides: 133 mg/dL (ref 0.0–149.0)
VLDL: 26.6 mg/dL (ref 0.0–40.0)

## 2015-06-14 LAB — HEMOGLOBIN A1C: Hgb A1c MFr Bld: 6.4 % (ref 4.6–6.5)

## 2015-06-17 ENCOUNTER — Ambulatory Visit (INDEPENDENT_AMBULATORY_CARE_PROVIDER_SITE_OTHER): Payer: Medicare Other | Admitting: Endocrinology

## 2015-06-17 ENCOUNTER — Encounter: Payer: Self-pay | Admitting: Endocrinology

## 2015-06-17 VITALS — BP 148/52 | HR 100 | Wt 136.0 lb

## 2015-06-17 DIAGNOSIS — Z23 Encounter for immunization: Secondary | ICD-10-CM | POA: Diagnosis not present

## 2015-06-17 DIAGNOSIS — E785 Hyperlipidemia, unspecified: Secondary | ICD-10-CM

## 2015-06-17 DIAGNOSIS — E119 Type 2 diabetes mellitus without complications: Secondary | ICD-10-CM

## 2015-06-17 DIAGNOSIS — I10 Essential (primary) hypertension: Secondary | ICD-10-CM

## 2015-06-17 MED ORDER — ATORVASTATIN CALCIUM 20 MG PO TABS: 20.0000 mg | ORAL_TABLET | Freq: Every day | ORAL | Status: DC

## 2015-06-17 NOTE — Progress Notes (Signed)
Patient ID: Julie Brewer, female   DOB: 06/23/1950, 65 y.o.   MRN: [id number]   Reason for Appointment:   History of Present Illness   Diagnosis: Type 2 DIABETES MELITUS, date of diagnosis:  1985  Prior history: She has been on insulin since diagnosis and on Lantus previously Also at some point had been started on Glucophage several years ago also Because of insurance preference Lantus was changed to Levemir  She refuses to use analog rapid acting insulin because of cost and is using regular insulin for several years   Her blood sugars are generally well controlled and A1c usually under 7%  Recent history:   Insulin regimen: Levemir insulin 35 in the morning once daily. Regular insulin 3-5 units a.m. and 8 before supper   Her A1c is stable in the upper normal range  Current blood sugar patterns, management and problems:  She did not bring her monitor for download, previously had been checking infrequently  She thinks she is doing her readings only at breakfast and suppertime  Overall is watching her diet with portions and low fat content.  She is also reducing sodium and any diet drinks  Not able to do much physical activity  She does not think she has any hypoglycemia now, however usually does not have symptoms   She continues to be on low dose metformin ER  Oral hypoglycemic drugs: Metformin ER 750 at supper       Side effects from medications: None Proper timing of medications in relation to meals: Yes, 30 min ac.          Monitors blood glucose: Less than once a day Glucometer:  FreeStyle      Blood Glucose readings by recall: fasting around 110 Suppertime 120s   Meals:  usually 2 meals per day at 10 AM and 5 PM.    Mealtime protein sources: Egg, [place], chicken. Sweet drinks not consumed  Physical activity: exercise: Just walking within the house              Microalbumin has been persistently high previously, was normal at 26 in 9/15  Wt  Readings from Last 3 Encounters:  06/17/15 136 lb (61.689 kg)  03/18/15 139 lb 12.8 oz (63.413 kg)  12/16/14 129 lb 9.6 oz (58.786 kg)   Lab Results  Component Value Date   HGBA1C 6.4 06/14/2015   HGBA1C 6.3 03/15/2015   HGBA1C 5.9 12/13/2014   Lab Results  Component Value Date   MICROALBUR 76.9* 03/15/2015   LDLCALC 59 06/14/2015   CREATININE 1.27* 06/14/2015        Lab on 06/14/2015  Component Date Value Ref Range Status  . Hgb A1c MFr Bld 06/14/2015 6.4  4.6 - 6.5 % Final   Glycemic Control Guidelines for People with Diabetes:Non Diabetic:  <6%Goal of Therapy: <7%Additional Action Suggested:  >8%   . Sodium 06/14/2015 137  135 - 145 mEq/L Final  . Potassium 06/14/2015 4.5  3.5 - 5.1 mEq/L Final  . Chloride 06/14/2015 109  96 - 112 mEq/L Final  . CO2 06/14/2015 21  19 - 32 mEq/L Final  . Glucose, Bld 06/14/2015 120* 70 - 99 mg/dL Final  . BUN 06/14/2015 23  6 - 23 mg/dL Final  . Creatinine, Ser 06/14/2015 1.27* 0.40 - 1.20 mg/dL Final  . Total Bilirubin 06/14/2015 0.2  0.2 - 1.2 mg/dL Final  . Alkaline Phosphatase 06/14/2015 58  39 - 117 U/L Final  . AST 06/14/2015 13  0 - 37 U/L Final  . ALT 06/14/2015 9  0 - 35 U/L Final  . Total Protein 06/14/2015 6.3  6.0 - 8.3 g/dL Final  . Albumin 06/14/2015 3.6  3.5 - 5.2 g/dL Final  . Calcium 06/14/2015 9.3  8.4 - 10.5 mg/dL Final  . GFR 06/14/2015 54.71* >60.00 mL/min Final  . Cholesterol 06/14/2015 158  0 - 200 mg/dL Final   ATP III Classification       Desirable:  < 200 mg/dL               Borderline High:  200 - 239 mg/dL          High:  > = 240 mg/dL  . Triglycerides 06/14/2015 133.0  0.0 - 149.0 mg/dL Final   Normal:  <150 mg/dLBorderline High:  150 - 199 mg/dL  . HDL 06/14/2015 72.60  >39.00 mg/dL Final  . VLDL 06/14/2015 26.6  0.0 - 40.0 mg/dL Final  . LDL Cholesterol 06/14/2015 59  0 - 99 mg/dL Final  . Total CHOL/HDL Ratio 06/14/2015 2   Final                  Men          Women1/2 Average Risk     3.4           3.3Average Risk          5.0          4.42X Average Risk          9.6          7.13X Average Risk          15.0          11.0                      . NonHDL 06/14/2015 85.25   Final   NOTE:  Non-HDL goal should be 30 mg/dL higher than patient's LDL goal (i.e. LDL goal of < 70 mg/dL, would have non-HDL goal of < 100 mg/dL)      Medication List       This list is accurate as of: 06/17/15 10:52 AM.  Always use your most recent med list.               amLODipine 10 MG tablet  Commonly known as:  NORVASC     aspirin 325 MG EC tablet  Take 325 mg by mouth daily.     cilostazol 100 MG tablet  Commonly known as:  PLETAL  TAKE ONE TABLET BY MOUTH TWICE DAILY     gabapentin 100 MG capsule  Commonly known as:  NEURONTIN  TAKE ONE CAPSULE BY MOUTH TWICE DAILY     glucose blood test strip  Commonly known as:  FREESTYLE LITE  Use as instructed to check blood sugar 2 times a day dx code E11.9     insulin regular 100 units/mL injection  Commonly known as:  NOVOLIN R,HUMULIN R  Inject into the skin 2 (two) times daily before a meal. 3/5 units in the morning and 8 units in the evening.     LEVEMIR 100 UNIT/ML injection  Generic drug:  insulin detemir  INJECT 56 UNITS INTO THE SKIN IN THE MORNING AND 30 UNITS INTO THE SKIN IN THE EVENING.     lisinopril 40 MG tablet  Commonly known as:  PRINIVIL,ZESTRIL     metFORMIN 750 MG 24 hr tablet  Commonly known as:  GLUCOPHAGE-XR  TAKE ONE TABLET BY MOUTH ONCE DAILY     omeprazole 20 MG capsule  Commonly known as:  PRILOSEC  TAKE ONE CAPSULE BY MOUTH ONCE DAILY (DUE FOR OFFICE VISIT THIS MONTH).     ondansetron 4 MG disintegrating tablet  Commonly known as:  ZOFRAN-ODT     simvastatin 40 MG tablet  Commonly known as:  ZOCOR  TAKE ONE TABLET BY MOUTH ONCE DAILY IN THE EVENING        Allergies:  Allergies  Allergen Reactions  . Morphine And Related Other (See Comments)    Hallucenations   . Penicillins Rash    Swelling    Past  Medical History  Diagnosis Date  . Hypertension   . Hyperlipidemia   . Peripheral vascular disease ([place])   . Glaucoma   . 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 Stroke [facility])     no residual  . Shortness of breath dyspnea     with exdrtion, "Walkling too fast"  . Asthma     No probnlems recently  . Pneumonia 2006  . GERD (gastroesophageal reflux disease)   . Diabetes mellitus     Type II per Dr [name]  (patient said type I)    Past Surgical History  Procedure Laterality Date  . Refractive surgery Bilateral     both eyes  . Abdominal hysterectomy    . Colonoscopy  July 09, 2012  . Spine surgery      lumbar  . Cervical fusion      with graft from hip  . Direct laryngoscopy N/A 06/07/2014    Procedure: DIRECT LARYNGOSCOPY with BIOPSY and EXCISION VOLLECULAR CYST;  Surgeon: Ruby Cola, MD;  Location: [facility] OR;  Service: ENT;  Laterality: N/A;    Family History  Problem Relation Age of Onset  . Cancer Mother   . Heart disease Mother   . Diabetes Father   . Cancer Brother   . Cancer Brother     Social History:  reports that she has been smoking Cigarettes.  She has a 15 pack-year smoking history. She has never used smokeless tobacco. She reports that she does not drink alcohol or use illicit drugs.  Review of Systems:   Blindness: She has absent vision on the right side and can see silhouettes only on the left.  Has been refusing to go to the eye doctor because of the cost   HYPERTENSION:   taking clonidine and lisinopril 10 mg dose.   Her HCTZ was stopped by PCP and lisinopril increased to 10 mg; her creatinine is relatively higher than before  Now appears to have a high urine microalbumin  Lab Results  Component Value Date   CREATININE 1.27* 06/14/2015   BUN 23 06/14/2015   NA 137 06/14/2015   K 4.5 06/14/2015   CL 109 06/14/2015   CO2 21 06/14/2015     HYPERLIPIDEMIA: The lipid abnormality consists of  elevated LDL controlled with simvastatin 40 mg.  Her baseline LDL was 202  Lab Results  Component Value Date   CHOL 158 06/14/2015   HDL 72.60 06/14/2015   LDLCALC 59 06/14/2015   TRIG 133.0 06/14/2015   CHOLHDL 2 06/14/2015    Vitamin D: Taking 50,000 unit dosage once a month, Unclear if this is followed by PCP  Lab Results  Component Value Date   VD25OH 31.74 05/31/2014  Last diabetic foot exam was done in 08/2014   Examination:   BP 148/52 mmHg  Pulse 100  Wt 136 lb (61.689 kg)  SpO2 97%  Body mass index is 23.89 kg/(m^2).    No pedal edema present  Heart rhythm regular, heart sounds normal  ASSESSMENT/ PLAN:   Diabetes type 2  See history of present illness for detailed discussion of his current management, blood sugar patterns and problems identified  The patient's blood sugars are fairly well controlled as judged by her A1c Her blood sugars are difficult to evaluate as she did not bring her monitor for download Not clear if she is checking her sugars regularly but also doing them mostly before breakfast and supper anyway No hypoglycemia reported She has improved her weight with better portion control  She will continue the same regimen for now, will try to do more readings at bedtime or other postprandial readings as directed Discussed blood sugar targets  HYPERTENSION: Now better controlled Continue lisinopril 40 mg and amlodipine, she is being treated by PCP now Will need to monitor creatinine also  Periodically follow microalbumin  HYPERLIPIDEMIA: She is on simvastatin but also taking 10 mg amlodipine now. We will change her to 20 mg Lipitor to avoid drug interaction, discussed the differences with this     PREVENTIVE care: She has not had Prevnar and will do so today.  Discussed how this is different from Pneumovax and given information brochure on this   Patient Instructions  Check blood sugars on waking up 2-3  times a week Also check  blood sugars about 2 hours after a meal and do this after different meals by rotation  Recommended blood sugar levels on waking up is 90-130 and about 2 hours after meal is 130-160  Please bring your blood sugar monitor to each visit, thank you     [facility] 06/17/2015, 10:52 AM   Counseling time on subjects discussed above is over 50% of today's 25 minute visit

## 2015-06-17 NOTE — Patient Instructions (Signed)
Check blood sugars on waking up 2-3 times a week Also check blood sugars about 2 hours after a meal and do this after different meals by rotation  Recommended blood sugar levels on waking up is 90-130 and about 2 hours after meal is 130-160  Please bring your blood sugar monitor to each visit, thank you  

## 2015-07-27 ENCOUNTER — Encounter: Payer: Self-pay | Admitting: Podiatry

## 2015-07-27 ENCOUNTER — Ambulatory Visit (INDEPENDENT_AMBULATORY_CARE_PROVIDER_SITE_OTHER): Payer: Medicare Other | Admitting: Podiatry

## 2015-07-27 ENCOUNTER — Other Ambulatory Visit: Payer: Self-pay | Admitting: Endocrinology

## 2015-07-27 DIAGNOSIS — M79676 Pain in unspecified toe(s): Secondary | ICD-10-CM

## 2015-07-27 DIAGNOSIS — B351 Tinea unguium: Secondary | ICD-10-CM | POA: Diagnosis not present

## 2015-07-27 DIAGNOSIS — Q828 Other specified congenital malformations of skin: Secondary | ICD-10-CM | POA: Diagnosis not present

## 2015-07-27 DIAGNOSIS — E1151 Type 2 diabetes mellitus with diabetic peripheral angiopathy without gangrene: Secondary | ICD-10-CM

## 2015-07-27 NOTE — Patient Instructions (Signed)
Diabetes and Foot Care Diabetes may cause you to have problems because of poor blood supply (circulation) to your feet and legs. This may cause the skin on your feet to become thinner, break easier, and heal more slowly. Your skin may become dry, and the skin may peel and crack. You may also have nerve damage in your legs and feet causing decreased feeling in them. You may not notice minor injuries to your feet that could lead to infections or more serious problems. Taking care of your feet is one of the most important things you can do for yourself.  HOME CARE INSTRUCTIONS  Wear shoes at all times, even in the house. Do not go barefoot. Bare feet are easily injured.  Check your feet daily for blisters, cuts, and redness. If you cannot see the bottom of your feet, use a mirror or ask someone for help.  Wash your feet with warm water (do not use hot water) and mild soap. Then pat your feet and the areas between your toes until they are completely dry. Do not soak your feet as this can dry your skin.  Apply a moisturizing lotion or petroleum jelly (that does not contain alcohol and is unscented) to the skin on your feet and to dry, brittle toenails. Do not apply lotion between your toes.  Trim your toenails straight across. Do not dig under them or around the cuticle. File the edges of your nails with an emery board or nail file.  Do not cut corns or calluses or try to remove them with medicine.  Wear clean socks or stockings every day. Make sure they are not too tight. Do not wear knee-high stockings since they may decrease blood flow to your legs.  Wear shoes that fit properly and have enough cushioning. To break in new shoes, wear them for just a few hours a day. This prevents you from injuring your feet. Always look in your shoes before you put them on to be sure there are no objects inside.  Do not cross your legs. This may decrease the blood flow to your feet.  If you find a minor scrape,  cut, or break in the skin on your feet, keep it and the skin around it clean and dry. These areas may be cleansed with mild soap and water. Do not cleanse the area with peroxide, alcohol, or iodine.  When you remove an adhesive bandage, be sure not to damage the skin around it.  If you have a wound, look at it several times a day to make sure it is healing.  Do not use heating pads or hot water bottles. They may burn your skin. If you have lost feeling in your feet or legs, you may not know it is happening until it is too late.  Make sure your health care provider performs a complete foot exam at least annually or more often if you have foot problems. Report any cuts, sores, or bruises to your health care provider immediately. SEEK MEDICAL CARE IF:   You have an injury that is not healing.  You have cuts or breaks in the skin.  You have an ingrown nail.  You notice redness on your legs or feet.  You feel burning or tingling in your legs or feet.  You have pain or cramps in your legs and feet.  Your legs or feet are numb.  Your feet always feel cold. SEEK IMMEDIATE MEDICAL CARE IF:   There is increasing redness,   swelling, or pain in or around a wound.  There is a red line that goes up your leg.  Pus is coming from a wound.  You develop a fever or as directed by your health care provider.  You notice a bad smell coming from an ulcer or wound.   This information is not intended to replace advice given to you by your health care provider. Make sure you discuss any questions you have with your health care provider.   Document Released: 01/13/2000 Document Revised: 09/17/2012 Document Reviewed: 06/24/2012 Elsevier Interactive Patient Education 2016 [employer]  

## 2015-07-28 NOTE — Progress Notes (Signed)
Patient ID: Julie Brewer, female   DOB: 1950-04-23, 65 y.o.   MRN: [id number]  Subjective: This patient presents again for a scheduled visit complaining of thickened and elongated toenails which are uncomfortable walking wearing shoes as well as a painful plantar callus on the right foot. She presents for debridement of these nails and painful plantar callus  Objective: No open skin lesions bilaterally The toenails are hypertrophic, elongated, discolored, deformed and tender direct palpation 6-10 Plantar nucleated keratoses subsecond MPJ right  Assessment: Symptomatic onychomycoses 6-10 Keratoses/porokeratosis second MPJ right Diabetic with peripheral arterial disease  Plan: Debridement toenails 10 and mechanically and electrically without any bleeding Debride plantar keratoses 1 without any bleeding  Reappoint 3 months

## 2015-08-06 ENCOUNTER — Other Ambulatory Visit: Payer: Self-pay | Admitting: Surgery

## 2015-09-03 ENCOUNTER — Other Ambulatory Visit: Payer: Self-pay | Admitting: Endocrinology

## 2015-09-08 ENCOUNTER — Encounter: Payer: Self-pay | Admitting: Family

## 2015-09-12 ENCOUNTER — Ambulatory Visit (HOSPITAL_COMMUNITY)
Admission: RE | Admit: 2015-09-12 | Discharge: 2015-09-12 | Disposition: A | Discharge status: Home / Self Care | Payer: Medicare Other | Source: Ambulatory Visit | Attending: Surgery | Admitting: Surgery

## 2015-09-12 ENCOUNTER — Encounter: Payer: Self-pay | Admitting: Family

## 2015-09-12 ENCOUNTER — Ambulatory Visit (INDEPENDENT_AMBULATORY_CARE_PROVIDER_SITE_OTHER): Payer: Medicare Other | Admitting: Family

## 2015-09-12 VITALS — BP 158/77 | HR 92 | Temp 97.9°F | Resp 20 | Ht 63.0 in | Wt 140.0 lb

## 2015-09-12 DIAGNOSIS — Z720 Tobacco use: Secondary | ICD-10-CM | POA: Diagnosis not present

## 2015-09-12 DIAGNOSIS — I70212 Atherosclerosis of native arteries of extremities with intermittent claudication, left leg: Secondary | ICD-10-CM

## 2015-09-12 DIAGNOSIS — K219 Gastro-esophageal reflux disease without esophagitis: Secondary | ICD-10-CM | POA: Insufficient documentation

## 2015-09-12 DIAGNOSIS — E1151 Type 2 diabetes mellitus with diabetic peripheral angiopathy without gangrene: Secondary | ICD-10-CM | POA: Diagnosis not present

## 2015-09-12 DIAGNOSIS — R0989 Other specified symptoms and signs involving the circulatory and respiratory systems: Secondary | ICD-10-CM | POA: Diagnosis present

## 2015-09-12 DIAGNOSIS — I739 Peripheral vascular disease, unspecified: Secondary | ICD-10-CM | POA: Diagnosis not present

## 2015-09-12 DIAGNOSIS — E785 Hyperlipidemia, unspecified: Secondary | ICD-10-CM | POA: Diagnosis not present

## 2015-09-12 DIAGNOSIS — F17200 Nicotine dependence, unspecified, uncomplicated: Secondary | ICD-10-CM

## 2015-09-12 DIAGNOSIS — I10 Essential (primary) hypertension: Secondary | ICD-10-CM | POA: Insufficient documentation

## 2015-09-12 DIAGNOSIS — R938 Abnormal findings on diagnostic imaging of other specified body structures: Secondary | ICD-10-CM | POA: Diagnosis not present

## 2015-09-12 DIAGNOSIS — I70219 Atherosclerosis of native arteries of extremities with intermittent claudication, unspecified extremity: Secondary | ICD-10-CM | POA: Diagnosis not present

## 2015-09-12 DIAGNOSIS — E119 Type 2 diabetes mellitus without complications: Secondary | ICD-10-CM | POA: Insufficient documentation

## 2015-09-12 MED ORDER — CILOSTAZOL 100 MG PO TABS: 100.0000 mg | ORAL_TABLET | Freq: Two times a day (BID) | ORAL | 0 refills | Status: AC

## 2015-09-12 NOTE — Patient Instructions (Signed)
Peripheral Vascular Disease Peripheral vascular disease (PVD) is a disease of the blood vessels that are not part of your heart and brain. A simple term for PVD is poor circulation. In most cases, PVD narrows the blood vessels that carry blood from your heart to the rest of your body. This can result in a decreased supply of blood to your arms, legs, and internal organs, like your stomach or kidneys. However, it most often affects a person's lower legs and feet. There are two types of PVD.  Organic PVD. This is the more common type. It is caused by damage to the structure of blood vessels.  Functional PVD. This is caused by conditions that make blood vessels contract and tighten (spasm). Without treatment, PVD tends to get worse over time. PVD can also lead to acute ischemic limb. This is when an arm or limb suddenly has trouble getting enough blood. This is a medical emergency. CAUSES Each type of PVD has many different causes. The most common cause of PVD is buildup of a fatty material (plaque) inside of your arteries (atherosclerosis). Small amounts of plaque can break off from the walls of the blood vessels and become lodged in a smaller artery. This blocks blood flow and can cause acute ischemic limb. Other common causes of PVD include:  Blood clots that form inside of blood vessels.  Injuries to blood vessels.  Diseases that cause inflammation of blood vessels or cause blood vessel spasms.  Health behaviors and health history that increase your risk of developing PVD. RISK FACTORS  You may have a greater risk of PVD if you:  Have a family history of PVD.  Have certain medical conditions, including:  High cholesterol.  Diabetes.  High blood pressure (hypertension).  Coronary heart disease.  Past problems with blood clots.  Past injury, such as burns or a broken bone. These may have damaged blood vessels in your limbs.  Buerger disease. This is caused by inflamed blood  vessels in your hands and feet.  Some forms of arthritis.  Rare birth defects that affect the arteries in your legs.  Use tobacco.  Do not get enough exercise.  Are obese.  Are age 50 or older. SIGNS AND SYMPTOMS  PVD may cause many different symptoms. Your symptoms depend on what part of your body is not getting enough blood. Some common signs and symptoms include:  Cramps in your lower legs. This may be a symptom of poor leg circulation (claudication).  Pain and weakness in your legs while you are physically active that goes away when you rest (intermittent claudication).  Leg pain when at rest.  Leg numbness, tingling, or weakness.  Coldness in a leg or foot, especially when compared with the other leg.  Skin or hair changes. These can include:  Hair loss.  Shiny skin.  Pale or bluish skin.  Thick toenails.  Inability to get or maintain an erection (erectile dysfunction). People with PVD are more prone to developing ulcers and sores on their toes, feet, or legs. These may take longer than normal to heal. DIAGNOSIS Your health care provider may diagnose PVD from your signs and symptoms. The health care provider will also do a physical exam. You may have tests to find out what is causing your PVD and determine its severity. Tests may include:  Blood pressure recordings from your arms and legs and measurements of the strength of your pulses (pulse volume recordings).  Imaging studies using sound waves to take pictures of   the blood flow through your blood vessels (Doppler ultrasound).  Injecting a dye into your blood vessels before having imaging studies using:  X-rays (angiogram or arteriogram).  Computer-generated X-rays (CT angiogram).  A powerful electromagnetic field and a computer (magnetic resonance angiogram or MRA). TREATMENT Treatment for PVD depends on the cause of your condition and the severity of your symptoms. It also depends on your age. Underlying  causes need to be treated and controlled. These include long-lasting (chronic) conditions, such as diabetes, high cholesterol, and high blood pressure. You may need to first try making lifestyle changes and taking medicines. Surgery may be needed if these do not work. Lifestyle changes may include:  Quitting smoking.  Exercising regularly.  Following a low-fat, low-cholesterol diet. Medicines may include:  Blood thinners to prevent blood clots.  Medicines to improve blood flow.  Medicines to improve your blood cholesterol levels. Surgical procedures may include:  A procedure that uses an inflated balloon to open a blocked artery and improve blood flow (angioplasty).  A procedure to put in a tube (stent) to keep a blocked artery open (stent implant).  Surgery to reroute blood flow around a blocked artery (peripheral bypass surgery).  Surgery to remove dead tissue from an infected wound on the affected limb.  Amputation. This is surgical removal of the affected limb. This may be necessary in cases of acute ischemic limb that are not improved through medical or surgical treatments. HOME CARE INSTRUCTIONS  Take medicines only as directed by your health care provider.  Do not use any tobacco products, including cigarettes, chewing tobacco, or electronic cigarettes. If you need help quitting, ask your health care provider.  Lose weight if you are overweight, and maintain a healthy weight as directed by your health care provider.  Eat a diet that is low in fat and cholesterol. If you need help, ask your health care provider.  Exercise regularly. Ask your health care provider to suggest some good activities for you.  Use compression stockings or other mechanical devices as directed by your health care provider.  Take good care of your feet.  Wear comfortable shoes that fit well.  Check your feet often for any cuts or sores. SEEK MEDICAL CARE IF:  You have cramps in your legs  while walking.  You have leg pain when you are at rest.  You have coldness in a leg or foot.  Your skin changes.  You have erectile dysfunction.  You have cuts or sores on your feet that are not healing. SEEK IMMEDIATE MEDICAL CARE IF:  Your arm or leg turns cold and blue.  Your arms or legs become red, warm, swollen, painful, or numb.  You have chest pain or trouble breathing.  You suddenly have weakness in your face, arm, or leg.  You become very confused or lose the ability to speak.  You suddenly have a very bad headache or lose your vision.   This information is not intended to replace advice given to you by your health care provider. Make sure you discuss any questions you have with your health care provider.   Document Released: 02/23/2004 Document Revised: 02/05/2014 Document Reviewed: 06/25/2013 Elsevier Interactive Patient Education 2016 Elsevier Inc[employer]     Steps to Quit Smoking  Smoking tobacco can be harmful to your health and can affect almost every organ in your body. Smoking puts you, and those around you, at risk for developing many serious chronic diseases. Quitting smoking is difficult, but it is one of   the best things that you can do for your health. It is never too late to quit. WHAT ARE THE BENEFITS OF QUITTING SMOKING? When you quit smoking, you lower your risk of developing serious diseases and conditions, such as:  Lung cancer or lung disease, such as COPD.  Heart disease.  Stroke.  Heart attack.  Infertility.  Osteoporosis and bone fractures. Additionally, symptoms such as coughing, wheezing, and shortness of breath may get better when you quit. You may also find that you get sick less often because your body is stronger at fighting off colds and infections. If you are pregnant, quitting smoking can help to reduce your chances of having a baby of low birth weight. HOW DO I GET READY TO QUIT? When you decide to quit smoking, create a plan to  make sure that you are successful. Before you quit:  Pick a date to quit. Set a date within the next two weeks to give you time to prepare.  Write down the reasons why you are quitting. Keep this list in places where you will see it often, such as on your bathroom mirror or in your car or wallet.  Identify the people, places, things, and activities that make you want to smoke (triggers) and avoid them. Make sure to take these actions:  Throw away all cigarettes at home, at work, and in your car.  Throw away smoking accessories, such as ashtrays and lighters.  Clean your car and make sure to empty the ashtray.  Clean your home, including curtains and carpets.  Tell your family, friends, and coworkers that you are quitting. Support from your loved ones can make quitting easier.  Talk with your health care provider about your options for quitting smoking.  Find out what treatment options are covered by your health insurance. WHAT STRATEGIES CAN I USE TO QUIT SMOKING?  Talk with your healthcare provider about different strategies to quit smoking. Some strategies include:  Quitting smoking altogether instead of gradually lessening how much you smoke over a period of time. Research shows that quitting "cold turkey" is more successful than gradually quitting.  Attending in-person counseling to help you build problem-solving skills. You are more likely to have success in quitting if you attend several counseling sessions. Even short sessions of 10 minutes can be effective.  Finding resources and support systems that can help you to quit smoking and remain smoke-free after you quit. These resources are most helpful when you use them often. They can include:  Online chats with a counselor.  Telephone quitlines.  Printed self-help materials.  Support groups or group counseling.  Text messaging programs.  Mobile phone applications.  Taking medicines to help you quit smoking. (If you are  pregnant or breastfeeding, talk with your health care provider first.) Some medicines contain nicotine and some do not. Both types of medicines help with cravings, but the medicines that include nicotine help to relieve withdrawal symptoms. Your health care provider may recommend:  Nicotine patches, gum, or lozenges.  Nicotine inhalers or sprays.  Non-nicotine medicine that is taken by mouth. Talk with your health care provider about combining strategies, such as taking medicines while you are also receiving in-person counseling. Using these two strategies together makes you more likely to succeed in quitting than if you used either strategy on its own. If you are pregnant or breastfeeding, talk with your health care provider about finding counseling or other support strategies to quit smoking. Do not take medicine to help you   quit smoking unless told to do so by your health care provider. WHAT THINGS CAN I DO TO MAKE IT EASIER TO QUIT? Quitting smoking might feel overwhelming at first, but there is a lot that you can do to make it easier. Take these important actions:  Reach out to your family and friends and ask that they support and encourage you during this time. Call telephone quitlines, reach out to support groups, or work with a counselor for support.  Ask people who smoke to avoid smoking around you.  Avoid places that trigger you to smoke, such as bars, parties, or smoke-break areas at work.  Spend time around people who do not smoke.  Lessen stress in your life, because stress can be a smoking trigger for some people. To lessen stress, try:  Exercising regularly.  Deep-breathing exercises.  Yoga.  Meditating.  Performing a body scan. This involves closing your eyes, scanning your body from head to toe, and noticing which parts of your body are particularly tense. Purposefully relax the muscles in those areas.  Download or purchase mobile phone or tablet apps (applications)  that can help you stick to your quit plan by providing reminders, tips, and encouragement. There are many free apps, such as QuitGuide from the CDC (Centers for Disease Control and Prevention). You can find other support for quitting smoking (smoking cessation) through smokefree.gov and other websites. HOW WILL I FEEL WHEN I QUIT SMOKING? Within the first 24 hours of quitting smoking, you may start to feel some withdrawal symptoms. These symptoms are usually most noticeable 2-3 days after quitting, but they usually do not last beyond 2-3 weeks. Changes or symptoms that you might experience include:  Mood swings.  Restlessness, anxiety, or irritation.  Difficulty concentrating.  Dizziness.  Strong cravings for sugary foods in addition to nicotine.  Mild weight gain.  Constipation.  Nausea.  Coughing or a sore throat.  Changes in how your medicines work in your body.  A depressed mood.  Difficulty sleeping (insomnia). After the first 2-3 weeks of quitting, you may start to notice more positive results, such as:  Improved sense of smell and taste.  Decreased coughing and sore throat.  Slower heart rate.  Lower blood pressure.  Clearer skin.  The ability to breathe more easily.  Fewer sick days. Quitting smoking is very challenging for most people. Do not get discouraged if you are not successful the first time. Some people need to make many attempts to quit before they achieve long-term success. Do your best to stick to your quit plan, and talk with your health care provider if you have any questions or concerns.   This information is not intended to replace advice given to you by your health care provider. Make sure you discuss any questions you have with your health care provider.   Document Released: 01/09/2001 Document Revised: 06/01/2014 Document Reviewed: 06/01/2014 Elsevier Interactive Patient Education 2016 Elsevier Inc.  

## 2015-09-12 NOTE — Progress Notes (Signed)
[facility] [place]   CC: Follow up peripheral artery occlusive disease  History of Present Illness Julie Brewer is a 65 y.o. female patient of Dr. Trula Slade.  The patient is back today for followup of her peripheral vascular disease. When Dr. Trula Slade met her in 2011 she was having claudication in her left leg at approximately one block. We treated her with cilostazol, and she did much better.  She no longer has left calf heavy feeling after walking 10 minutes, no more claudication, denies non healing wounds.  She states she had a TIA in about 2010 as manifested by slurred speech that resolved, denies hemiparesis, denies further loss of vision. She states that her loss of vision in both eyes is due to glaucoma.  She continues to be medically managed for hypertension and hyperlipidemia as well as her diabetes. She is on a statin as well as an ACE inhibitor.  The patient denies New Medical or Surgical History.  Pt Diabetic: Yes, states her last A1C was 6.? Pt smoker: smoker (1 ppd, started at age 65 yrs)  Pt meds include: Statin :Yes ASA: Yes Other anticoagulants/antiplatelets: Pletal      Past Medical History:  Diagnosis Date  . Asthma    No probnlems recently  . 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 Diabetes mellitus    Type II per Dr [name]  (patient said type I)  . GERD (gastroesophageal reflux disease)   . Glaucoma   . Hyperlipidemia   . Hypertension   . Peripheral vascular disease ([place])   . Pneumonia 2006  . Shortness of breath dyspnea    with exdrtion, "Walkling too fast"  . Stroke [facility])    no residual    Social History Social History  Substance Use Topics  . Smoking status: Current Every Day Smoker    Packs/day: 1.00    Years: 30.00    Types: Cigarettes  . Smokeless tobacco: Never Used  . Alcohol use No    Family History Family History  Problem Relation Age of Onset   . Cancer Mother   . Heart disease Mother   . Diabetes Father   . Cancer Brother   . Cancer Brother     Past Surgical History:  Procedure Laterality Date  . ABDOMINAL HYSTERECTOMY    . CERVICAL FUSION     with graft from hip  . COLONOSCOPY  July 09, 2012  . DIRECT LARYNGOSCOPY N/A 06/07/2014   Procedure: DIRECT LARYNGOSCOPY with BIOPSY and EXCISION VOLLECULAR CYST;  Surgeon: Ruby Cola, MD;  Location: [place];  Service: ENT;  Laterality: N/A;  . REFRACTIVE SURGERY Bilateral    both eyes  . SPINE SURGERY     lumbar    Allergies  Allergen Reactions  . Morphine And Related Other (See Comments)    Hallucenations   . Penicillins Rash    Swelling    Current Outpatient Prescriptions  Medication Sig Dispense Refill  . amLODipine (NORVASC) 10 MG tablet     . aspirin 325 MG EC tablet Take 325 mg by mouth daily.    Marland Kitchen atorvastatin (LIPITOR) 20 MG tablet Take 1 tablet (20 mg total) by mouth daily. 90 tablet 3  . cilostazol (PLETAL) 100 MG tablet TAKE ONE TABLET BY MOUTH TWICE DAILY 60 tablet 6  . gabapentin (NEURONTIN) 100 MG capsule TAKE ONE CAPSULE BY MOUTH TWICE DAILY 180 capsule 1  . glucose blood (  FREESTYLE LITE) test strip Use as instructed to check blood sugar 2 times a day dx code E11.9 200 each 1  . insulin regular (NOVOLIN R,HUMULIN R) 100 units/mL injection Inject into the skin 2 (two) times daily before a meal. 3/5 units in the morning and 8 units in the evening.    Marland Kitchen LEVEMIR 100 UNIT/ML injection INJECT 56 UNITS INTO THE SKIN IN THE MORNING AND 30 UNITS INTO THE SKIN IN THE EVENING. (Patient taking differently: INJECT 35 UNITS INTO THE SKIN IN THE MORNING.) 30 mL 3  . lisinopril (PRINIVIL,ZESTRIL) 40 MG tablet     . metFORMIN (GLUCOPHAGE-XR) 750 MG 24 hr tablet TAKE ONE TABLET BY MOUTH ONCE DAILY 90 tablet 0  . omeprazole (PRILOSEC) 20 MG capsule TAKE ONE CAPSULE BY MOUTH ONCE DAILY (DUE FOR OFFICE VISIT THIS MONTH). 90 capsule 0  . ondansetron (ZOFRAN-ODT) 4 MG  disintegrating tablet      No current facility-administered medications for this visit.     ROS: See HPI for pertinent positives and negatives.   Physical Examination  Vitals:   09/12/15 1055 09/12/15 1058  BP: (!) 151/78 (!) 158/77  Pulse: 92   Resp: 20   Temp: 97.9 F (36.6 C)   SpO2: 99%   Weight: 140 lb (63.5 kg)   Height: _0  (1.6 m)    Body mass index is 24.8 kg/m.  General: A&O x 3, WDWN. Gait: normal, using cane for the blind, holding the arm of her granddaughter Eyes: Pupils are unequal and non reactive to light; right eye surface is clouded. Pulmonary: Respirations are non labored, slightly decreased air movement, without wheezes , rales or rhonchi. Cardiac: regular rhythm, no detected murmur.     Carotid Bruits Right Left   Negative Negative  Aorta is not palpable. Radial pulses: are 2+ palpable and =.   VASCULAR EXAM: Extremities without ischemic changes  without Gangrene; without open wounds.     LE Pulses Right Left   FEMORAL 2+ palpable 1+ palpable    POPLITEAL not palpable  not palpable   POSTERIOR TIBIAL 2+ palpable  not palpable    DORSALIS PEDIS  ANTERIOR TIBIAL 2+ palpable  faintly palpable    Abdomen: soft, NT, no palpable masses. Skin: no rashes, no ulcers. Musculoskeletal: no muscle wasting or atrophy. Neurologic: A&O X 3; Appropriate Affect, MOTOR FUNCTION: moving all extremities equally, motor strength 5/5 in UE's, 4/5 in LE's. Speech is fluent/normal. CN 2-12 intact except for loss of vision.    Non-Invasive Vascular Imaging: DATE: 09/12/2015  ABI:  RIGHT: 1.02 (1.02, 09/06/14), Waveforms: triphasic; TBI: 0.85, toe pressure: 144   LEFT: 0.72 (0.81), Waveforms: biphasic; TBI: 0.59, toe pressure:  99   ASSESSMENT: Julie Brewer is a 65 y.o. female who has a history of mild left calf claudication, but currently denies claudication symptoms. She climbs and descends stairs several times/day, may not be walking much due to her inability to see, is blind. She therefore may not walk enough to elicit claudication symptoms.   ABI's: right LE remains stable and in the normal range with triphasic waveforms, left LE decreased from 81% to 72% arterial perfusion (moderate arterial occlusive disease) with biphasic waveforms.  Her atherosclerotic risk factors include currently controlled DM and active smoking. She states that her DM has come under control since she has been under the care of Dr. [name], endocrinologist. She states her DM was uncontrolled for a long time prior to this.  She does not walk much since  she is almost blind. See Plan.  She requests that Pletal be reordered, states this likely helps prevent her claudication. Was refilled electronically for 90 days with 3 refills.    PLAN:  The patient was counseled re smoking cessation and given several free resources re smoking cessation.   Daily seated leg exercises demonstrated to patient and her granddaughter, and discussed. Based on the patient's vascular studies and examination, pt will return to clinic in 1 year with ABI's.  I advised her and her granddaughter to notify us if she develops concerns re the circulation in her feet/legs.   I discussed in depth with the patient the nature of atherosclerosis, and emphasized the importance of maximal medical management including strict control of blood pressure, blood glucose, and lipid levels, obtaining regular exercise, and cessation of smoking.  The patient is aware that without maximal medical management the underlying atherosclerotic disease process will progress, limiting the benefit of any interventions.  The patient was given information about PAD including signs, symptoms,  treatment, what symptoms should prompt the patient to seek immediate medical care, and risk reduction measures to take.  Clemon Chambers, RN, MSN, FNP-C [facility] Phone: [phone]  Clinic MD: Trula Slade  09/12/15 11:16 AM

## 2015-09-13 ENCOUNTER — Other Ambulatory Visit (INDEPENDENT_AMBULATORY_CARE_PROVIDER_SITE_OTHER): Payer: Medicare Other

## 2015-09-13 DIAGNOSIS — E119 Type 2 diabetes mellitus without complications: Secondary | ICD-10-CM | POA: Diagnosis not present

## 2015-09-13 DIAGNOSIS — E785 Hyperlipidemia, unspecified: Secondary | ICD-10-CM | POA: Diagnosis not present

## 2015-09-13 LAB — COMPREHENSIVE METABOLIC PANEL
ALK PHOS: 65 U/L (ref 39–117)
ALT: 9 U/L (ref 0–35)
AST: 13 U/L (ref 0–37)
Albumin: 3.6 g/dL (ref 3.5–5.2)
BILIRUBIN TOTAL: 0.2 mg/dL (ref 0.2–1.2)
BUN: 12 mg/dL (ref 6–23)
CHLORIDE: 108 meq/L (ref 96–112)
CO2: 24 meq/L (ref 19–32)
CREATININE: 1.2 mg/dL (ref 0.40–1.20)
Calcium: 9.4 mg/dL (ref 8.4–10.5)
GFR: 58.36 mL/min — AB (ref >60.00)
Glucose, Bld: 91 mg/dL (ref 70–99)
POTASSIUM: 4 meq/L (ref 3.5–5.1)
SODIUM: 140 meq/L (ref 135–145)
Total Protein: 6.5 g/dL (ref 6.0–8.3)

## 2015-09-13 LAB — MICROALBUMIN / CREATININE URINE RATIO
CREATININE, U: 61.6 mg/dL
MICROALB UR: 80.4 mg/dL — AB (ref 0.0–1.9)
MICROALB/CREAT RATIO: 130.6 mg/g — AB (ref 0.0–30.0)

## 2015-09-13 LAB — LIPID PANEL
CHOLESTEROL: 156 mg/dL (ref 0–200)
HDL: 82.2 mg/dL (ref >39.00)
LDL Cholesterol: 49 mg/dL (ref 0–99)
NonHDL: 73.91
TRIGLYCERIDES: 125 mg/dL (ref 0.0–149.0)
Total CHOL/HDL Ratio: 2
VLDL: 25 mg/dL (ref 0.0–40.0)

## 2015-09-13 LAB — HEMOGLOBIN A1C: HEMOGLOBIN A1C: 6 % (ref 4.6–6.5)

## 2015-09-16 ENCOUNTER — Ambulatory Visit (INDEPENDENT_AMBULATORY_CARE_PROVIDER_SITE_OTHER): Payer: Medicare Other | Admitting: Endocrinology

## 2015-09-16 ENCOUNTER — Encounter: Payer: Self-pay | Admitting: Endocrinology

## 2015-09-16 VITALS — BP 150/70 | HR 98 | Ht 63.0 in | Wt 141.0 lb

## 2015-09-16 DIAGNOSIS — E785 Hyperlipidemia, unspecified: Secondary | ICD-10-CM | POA: Diagnosis not present

## 2015-09-16 DIAGNOSIS — E1129 Type 2 diabetes mellitus with other diabetic kidney complication: Secondary | ICD-10-CM

## 2015-09-16 DIAGNOSIS — E119 Type 2 diabetes mellitus without complications: Secondary | ICD-10-CM

## 2015-09-16 DIAGNOSIS — I10 Essential (primary) hypertension: Secondary | ICD-10-CM | POA: Diagnosis not present

## 2015-09-16 DIAGNOSIS — R809 Proteinuria, unspecified: Secondary | ICD-10-CM

## 2015-09-16 MED ORDER — HYDROCHLOROTHIAZIDE 12.5 MG PO CAPS: 12.5000 mg | ORAL_CAPSULE | Freq: Every day | ORAL | 1 refills | Status: DC

## 2015-09-16 NOTE — Progress Notes (Signed)
Patient ID: Julie Brewer, female   DOB: [birth year], 65 y.o.   MRN: [id number]   Reason for Appointment: Follow-up  History of Present Illness   Diagnosis: Type 2 DIABETES MELITUS, date of diagnosis:  1985  Prior history: She has been on insulin since diagnosis and on Lantus previously Also at some point had been started on Glucophage several years ago also Because of insurance preference Lantus was changed to Levemir  She refuses to use analog rapid acting insulin because of cost and is using regular insulin for several years   Her blood sugars are generally well controlled and A1c usually under 7%  Recent history:   Insulin regimen: Levemir insulin 35 in the morning once daily. Regular insulin 30 minutes Before eating, 3-5 units a.m. and 8 ac supper  Oral hypoglycemic drugs: Metformin ER 750 at supper   Her A1c is stable in the upper normal range  Current blood sugar patterns, management and problems:  She is not checking her blood sugar now at all and is not getting any help with monitoring at home  Lab glucose was 91 but today after breakfast it is 200  Overall is watching her diet with portions and low fat content but is gaining some weight.   Not able to do much physical activity  She does not think she has any hypoglycemia now, however usually does not have symptoms  Side effects from medications: None      Monitors blood glucose: None recently Glucometer:  FreeStyle     Meals:  usually 2 meals per day at 10 AM and 5 PM.    Mealtime protein sources: Egg, [place], chicken.  Recently not getting any protein with her recent and cereal. Avoiding sweet drinks Physical activity: exercise: Just walking within the house              Microalbumin has been recently high   Wt Readings from Last 3 Encounters:  09/16/15 141 lb (64 kg)  09/12/15 140 lb (63.5 kg)  06/17/15 136 lb (61.7 kg)   Lab Results  Component Value Date   HGBA1C 6.0 09/13/2015   HGBA1C 6.4 06/14/2015   HGBA1C 6.3 03/15/2015   Lab Results  Component Value Date   MICROALBUR 80.4 (H) 09/13/2015   LDLCALC 49 09/13/2015   CREATININE 1.20 09/13/2015        Lab on 09/13/2015  Component Date Value Ref Range Status  . Hgb A1c MFr Bld 09/13/2015 6.0  4.6 - 6.5 % Final  . Sodium 09/13/2015 140  135 - 145 mEq/L Final  . Potassium 09/13/2015 4.0  3.5 - 5.1 mEq/L Final  . Chloride 09/13/2015 108  96 - 112 mEq/L Final  . CO2 09/13/2015 24  19 - 32 mEq/L Final  . Glucose, Bld 09/13/2015 91  70 - 99 mg/dL Final  . BUN 09/13/2015 12  6 - 23 mg/dL Final  . Creatinine, Ser 09/13/2015 1.20  0.40 - 1.20 mg/dL Final  . Total Bilirubin 09/13/2015 0.2  0.2 - 1.2 mg/dL Final  . Alkaline Phosphatase 09/13/2015 65  39 - 117 U/L Final  . AST 09/13/2015 13  0 - 37 U/L Final  . ALT 09/13/2015 9  0 - 35 U/L Final  . Total Protein 09/13/2015 6.5  6.0 - 8.3 g/dL Final  . Albumin 09/13/2015 3.6  3.5 - 5.2 g/dL Final  . Calcium 09/13/2015 9.4  8.4 - 10.5 mg/dL Final  . GFR 09/13/2015 58.36* >60.00 mL/min Final  .  Cholesterol 09/13/2015 156  0 - 200 mg/dL Final  . Triglycerides 09/13/2015 125.0  0.0 - 149.0 mg/dL Final  . HDL 09/13/2015 82.20  >39.00 mg/dL Final  . VLDL 09/13/2015 25.0  0.0 - 40.0 mg/dL Final  . LDL Cholesterol 09/13/2015 49  0 - 99 mg/dL Final  . Total CHOL/HDL Ratio 09/13/2015 2   Final  . NonHDL 09/13/2015 73.91   Final  . Microalb, Ur 09/13/2015 80.4* 0.0 - 1.9 mg/dL Final  . Creatinine,U 09/13/2015 61.6  mg/dL Final  . Microalb Creat Ratio 09/13/2015 130.6* 0.0 - 30.0 mg/g Final      Medication List       Accurate as of 09/16/15  4:45 PM. Always use your most recent med list.          amLODipine 10 MG tablet Commonly known as:  NORVASC   aspirin 325 MG EC tablet Take 325 mg by mouth daily.   atorvastatin 20 MG tablet Commonly known as:  LIPITOR Take 1 tablet (20 mg total) by mouth daily.   cilostazol 100 MG tablet Commonly known as:  PLETAL Take  1 tablet (100 mg total) by mouth 2 (two) times daily.   gabapentin 100 MG capsule Commonly known as:  NEURONTIN TAKE ONE CAPSULE BY MOUTH TWICE DAILY   glucose blood test strip Commonly known as:  FREESTYLE LITE Use as instructed to check blood sugar 2 times a day dx code E11.9   hydrochlorothiazide 12.5 MG capsule Commonly known as:  MICROZIDE Take 1 capsule (12.5 mg total) by mouth daily.   insulin regular 100 units/mL injection Commonly known as:  NOVOLIN R,HUMULIN R Inject into the skin 2 (two) times daily before a meal. 3/5 units in the morning and 8 units in the evening.   LEVEMIR 100 UNIT/ML injection Generic drug:  insulin detemir INJECT 56 UNITS INTO THE SKIN IN THE MORNING AND 30 UNITS INTO THE SKIN IN THE EVENING.   lisinopril 40 MG tablet Commonly known as:  PRINIVIL,ZESTRIL   metFORMIN 750 MG 24 hr tablet Commonly known as:  GLUCOPHAGE-XR TAKE ONE TABLET BY MOUTH ONCE DAILY   omeprazole 20 MG capsule Commonly known as:  PRILOSEC TAKE ONE CAPSULE BY MOUTH ONCE DAILY (DUE FOR OFFICE VISIT THIS MONTH).   ondansetron 4 MG disintegrating tablet Commonly known as:  ZOFRAN-ODT       Allergies:  Allergies  Allergen Reactions  . Morphine And Related Other (See Comments)    Hallucenations   . Penicillins Rash    Swelling    Past Medical History:  Diagnosis Date  . Asthma    No probnlems recently  . 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 Diabetes mellitus    Type II per Dr [name]  (patient said type I)  . GERD (gastroesophageal reflux disease)   . Glaucoma   . Hyperlipidemia   . Hypertension   . Peripheral vascular disease ([place])   . Pneumonia 2006  . Shortness of breath dyspnea    with exdrtion, "Walkling too fast"  . Stroke [facility])    no residual    Past Surgical History:  Procedure Laterality Date  . ABDOMINAL HYSTERECTOMY    . CERVICAL FUSION     with graft from hip  . COLONOSCOPY  July 09, 2012  . DIRECT LARYNGOSCOPY N/A 06/07/2014   Procedure: DIRECT LARYNGOSCOPY with BIOPSY and EXCISION VOLLECULAR CYST;  Surgeon: Ruby Cola, MD;  Location: [facility]  OR;  Service: ENT;  Laterality: N/A;  . REFRACTIVE SURGERY Bilateral    both eyes  . SPINE SURGERY     lumbar    Family History  Problem Relation Age of Onset  . Cancer Mother   . Heart disease Mother   . Diabetes Father   . Cancer Brother   . Cancer Brother     Social History:  reports that she has been smoking Cigarettes.  She has a 30.00 pack-year smoking history. She has never used smokeless tobacco. She reports that she does not drink alcohol or use drugs.  Review of Systems:   Blindness: She has absent vision on the right side and can see silhouettes only on the left.  Has been refusing to go to the eye doctor because of the cost   HYPERTENSION:   taking amlodipine and lisinopril 40 mg dose.   Not monitoring at home Now appears to have a high urine microalbumin  Lab Results  Component Value Date   CREATININE 1.20 09/13/2015   BUN 12 09/13/2015   NA 140 09/13/2015   K 4.0 09/13/2015   CL 108 09/13/2015   CO2 24 09/13/2015     HYPERLIPIDEMIA: The lipid abnormality consists of elevated LDL controlled with Lipitor 20 mg .  Her baseline LDL was 202  Lab Results  Component Value Date   CHOL 156 09/13/2015   HDL 82.20 09/13/2015   LDLCALC 49 09/13/2015   TRIG 125.0 09/13/2015   CHOLHDL 2 09/13/2015    Vitamin D: Taking 50,000 unit dosage once a month, Unclear if this is followed by PCP  Lab Results  Component Value Date   VD25OH 31.74 05/31/2014       Last diabetic foot exam was done in 08/2015   Examination:   BP (!) 150/70   Pulse 98   Ht 5\' 3"  (1.6 m)   Wt 141 lb (64 kg)   SpO2 99%   BMI 24.98 kg/m   Body mass index is 24.98 kg/m.   Repeat blood pressure was 160/72  No pedal edema present  Diabetic Foot Exam - Simple   Simple Foot Form Diabetic Foot exam was performed with the  following findings:  Yes   Visual Inspection No deformities, no ulcerations, no other skin breakdown bilaterally:  Yes Sensation Testing Intact to touch and monofilament testing bilaterally:  Yes Pulse Check See comments:  Yes Comments Absent pedal pulses     ASSESSMENT/ PLAN:   Diabetes type 2  See history of present illness for detailed discussion of his current management, blood sugar patterns and problems identified  The patient's blood sugars are fairly well controlled as judged by her A1c Her blood sugars are difficult to evaluate as sheNow does not monitor her blood sugar at home No hypoglycemia reported but she does not always have symptoms with low sugars historically Her weight is going up somewhat not clear this is related to increase caloric intake, her activity level is usually unchanged  Discussed need for objective assessment of her blood sugar control especially ruling out hypoglycemia, postprandial control.  She will be sent to the nurse educator for a continuous glucose monitoring with a FreeStyle libre sensor and explained how this works   She has a high reading of about 200 after breakfast today and this is related to her not eating any protein with her cereals in the morning This is despite taking regular insulin 30 minutes before eating She does not want to pay for brand  name rapid acting insulin  HYPERTENSION: Now not controlled She is on lisinopril 40 mg and amlodipine, normal kidney function She will benefit from adding back HCTZ because of her Afro-American status Will need to monitor creatinine also  Periodically follow microalbumin which is now higher probably from inadequate blood pressure control  HYPERLIPIDEMIA: She is on Lipitor with excellent control, may even get by with 10 mg dose   There are no Patient Instructions on file for this visit.  Jacqui Headen 09/16/2015, 4:45 PM   Counseling time on subjects discussed above is over 50% of today's  25 minute visit

## 2015-09-28 ENCOUNTER — Other Ambulatory Visit: Payer: Self-pay | Admitting: Family

## 2015-09-28 DIAGNOSIS — I70219 Atherosclerosis of native arteries of extremities with intermittent claudication, unspecified extremity: Secondary | ICD-10-CM

## 2015-09-28 DIAGNOSIS — I739 Peripheral vascular disease, unspecified: Secondary | ICD-10-CM

## 2015-10-12 ENCOUNTER — Other Ambulatory Visit (INDEPENDENT_AMBULATORY_CARE_PROVIDER_SITE_OTHER): Payer: Medicare Other

## 2015-10-12 DIAGNOSIS — E119 Type 2 diabetes mellitus without complications: Secondary | ICD-10-CM | POA: Diagnosis not present

## 2015-10-12 LAB — BASIC METABOLIC PANEL
BUN: 23 mg/dL (ref 6–23)
CHLORIDE: 110 meq/L (ref 96–112)
CO2: 22 mEq/L (ref 19–32)
Calcium: 9 mg/dL (ref 8.4–10.5)
Creatinine, Ser: 1.35 mg/dL — ABNORMAL HIGH (ref 0.40–1.20)
GFR: 50.93 mL/min — AB (ref >60.00)
GLUCOSE: 92 mg/dL (ref 70–99)
POTASSIUM: 4.3 meq/L (ref 3.5–5.1)
SODIUM: 138 meq/L (ref 135–145)

## 2015-10-17 ENCOUNTER — Ambulatory Visit (INDEPENDENT_AMBULATORY_CARE_PROVIDER_SITE_OTHER): Payer: Medicare Other | Admitting: Endocrinology

## 2015-10-17 ENCOUNTER — Encounter: Payer: Self-pay | Admitting: Endocrinology

## 2015-10-17 VITALS — BP 128/64 | HR 97 | Temp 97.9°F | Resp 14 | Ht 63.0 in | Wt 138.4 lb

## 2015-10-17 DIAGNOSIS — Z23 Encounter for immunization: Secondary | ICD-10-CM

## 2015-10-17 DIAGNOSIS — I10 Essential (primary) hypertension: Secondary | ICD-10-CM

## 2015-10-17 NOTE — Progress Notes (Signed)
Patient ID: Julie Brewer, female   DOB: 07/31/1950, 65 y.o.   MRN: [id number]   Reason for Appointment: Follow-up of blood pressure  History of Present Illness   HYPERTENSION:  blood pressure was not well controlled with  taking amlodipine 10 mg and lisinopril 40 mg dose.   Not monitoring at home Also appears to have a high urine microalbumin on her last visit Previously her blood pressure used to be around 939 systolic  With adding HCTZ her blood pressure appears to be much improved now Has only minimal increase in creatinine from before   Lab Results  Component Value Date   CREATININE 1.35 (H) 10/12/2015   BUN 23 10/12/2015   NA 138 10/12/2015   K 4.3 10/12/2015   CL 110 10/12/2015   CO2 22 10/12/2015    The following is a copy of the previous note:  Diagnosis: Type 2 DIABETES MELITUS, date of diagnosis:  1985  Prior history: She has been on insulin since diagnosis and on Lantus previously Also at some point had been started on Glucophage several years ago also Because of insurance preference Lantus was changed to Levemir  She refuses to use analog rapid acting insulin because of cost and is using regular insulin for several years   Her blood sugars are generally well controlled and A1c usually under 7%  Recent history:   Insulin regimen: Levemir insulin 35 in the morning once daily. Regular insulin 30 minutes Before eating, 3-5 units a.m. and 8 ac supper  Oral hypoglycemic drugs: Metformin ER 750 at supper   Her A1c is stable in the upper normal range  Current blood sugar patterns, management and problems:  She is not checking her blood sugar now at all and is not getting any help with monitoring at home  Lab glucose was 91 but today after breakfast it is 200  Overall is watching her diet with portions and low fat content but is gaining some weight.   Not able to do much physical activity  She does not think she has any hypoglycemia now,  however usually does not have symptoms  Side effects from medications: None      Monitors blood glucose: None recently Glucometer:  FreeStyle     Meals:  usually 2 meals per day at 10 AM and 5 PM.    Mealtime protein sources: Egg, [place], chicken.  Recently not getting any protein with her recent and cereal. Avoiding sweet drinks Physical activity: exercise: Just walking within the house              Microalbumin has been recently high   Wt Readings from Last 3 Encounters:  10/17/15 138 lb 6.4 oz (62.8 kg)  09/16/15 141 lb (64 kg)  09/12/15 140 lb (63.5 kg)   Lab Results  Component Value Date   HGBA1C 6.0 09/13/2015   HGBA1C 6.4 06/14/2015   HGBA1C 6.3 03/15/2015   Lab Results  Component Value Date   MICROALBUR 80.4 (H) 09/13/2015   LDLCALC 49 09/13/2015   CREATININE 1.35 (H) 10/12/2015        Lab on 10/12/2015  Component Date Value Ref Range Status  . Sodium 10/12/2015 138  135 - 145 mEq/L Final  . Potassium 10/12/2015 4.3  3.5 - 5.1 mEq/L Final  . Chloride 10/12/2015 110  96 - 112 mEq/L Final  . CO2 10/12/2015 22  19 - 32 mEq/L Final  . Glucose, Bld 10/12/2015 92  70 - 99 mg/dL Final  .  BUN 10/12/2015 23  6 - 23 mg/dL Final  . Creatinine, Ser 10/12/2015 1.35* 0.40 - 1.20 mg/dL Final  . Calcium 10/12/2015 9.0  8.4 - 10.5 mg/dL Final  . GFR 10/12/2015 50.93* >60.00 mL/min Final      Medication List       Accurate as of 10/17/15  3:35 PM. Always use your most recent med list.          amLODipine 10 MG tablet Commonly known as:  NORVASC   aspirin 325 MG EC tablet Take 325 mg by mouth daily.   atorvastatin 20 MG tablet Commonly known as:  LIPITOR Take 1 tablet (20 mg total) by mouth daily.   cilostazol 100 MG tablet Commonly known as:  PLETAL Take 1 tablet (100 mg total) by mouth 2 (two) times daily.   gabapentin 100 MG capsule Commonly known as:  NEURONTIN TAKE ONE CAPSULE BY MOUTH TWICE DAILY   glucose blood test strip Commonly known as:   FREESTYLE LITE Use as instructed to check blood sugar 2 times a day dx code E11.9   hydrochlorothiazide 12.5 MG capsule Commonly known as:  MICROZIDE Take 1 capsule (12.5 mg total) by mouth daily.   insulin regular 100 units/mL injection Commonly known as:  NOVOLIN R,HUMULIN R Inject into the skin 2 (two) times daily before a meal. 3/5 units in the morning and 8 units in the evening.   LEVEMIR 100 UNIT/ML injection Generic drug:  insulin detemir INJECT 56 UNITS INTO THE SKIN IN THE MORNING AND 30 UNITS INTO THE SKIN IN THE EVENING.   lisinopril 40 MG tablet Commonly known as:  PRINIVIL,ZESTRIL   metFORMIN 750 MG 24 hr tablet Commonly known as:  GLUCOPHAGE-XR TAKE ONE TABLET BY MOUTH ONCE DAILY   omeprazole 20 MG capsule Commonly known as:  PRILOSEC TAKE ONE CAPSULE BY MOUTH ONCE DAILY (DUE FOR OFFICE VISIT THIS MONTH).   ondansetron 4 MG disintegrating tablet Commonly known as:  ZOFRAN-ODT       Allergies:  Allergies  Allergen Reactions  . Morphine And Related Other (See Comments)    Hallucenations   . Penicillins Rash    Swelling    Past Medical History:  Diagnosis Date  . Asthma    No probnlems recently  . 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 Diabetes mellitus    Type II per Dr [name]  (patient said type I)  . GERD (gastroesophageal reflux disease)   . Glaucoma   . Hyperlipidemia   . Hypertension   . Peripheral vascular disease ([place])   . Pneumonia 2006  . Shortness of breath dyspnea    with exdrtion, "Walkling too fast"  . Stroke [facility])    no residual    Past Surgical History:  Procedure Laterality Date  . ABDOMINAL HYSTERECTOMY    . CERVICAL FUSION     with graft from hip  . COLONOSCOPY  July 09, 2012  . DIRECT LARYNGOSCOPY N/A 06/07/2014   Procedure: DIRECT LARYNGOSCOPY with BIOPSY and EXCISION VOLLECULAR CYST;  Surgeon: Ruby Cola, MD;  Location: [place];  Service: ENT;  Laterality: N/A;  .  REFRACTIVE SURGERY Bilateral    both eyes  . SPINE SURGERY     lumbar    Family History  Problem Relation Age of Onset  . Cancer Mother   . Heart disease Mother   . Diabetes Father   . Cancer Brother   . Cancer Brother  Social History:  reports that she has been smoking Cigarettes.  She has a 30.00 pack-year smoking history. She has never used smokeless tobacco. She reports that she does not drink alcohol or use drugs.  Review of Systems:   Blindness: She has absent vision on the right side and can see silhouettes only on the left.     HYPERLIPIDEMIA: The lipid abnormality consists of elevated LDL very well controlled with Lipitor 20 mg .   Her baseline LDL was 202  Lab Results  Component Value Date   CHOL 156 09/13/2015   HDL 82.20 09/13/2015   LDLCALC 49 09/13/2015   TRIG 125.0 09/13/2015   CHOLHDL 2 09/13/2015    Vitamin D: Taking 50,000 unit dosage once a month, ?  followed by PCP  Lab Results  Component Value Date   VD25OH 31.74 05/31/2014       Last diabetic foot exam was done in 08/2015   Examination:   BP 128/64 (BP Location: Left Arm)   Pulse 97   Temp 97.9 F (36.6 C)   Resp 14   Ht 5\' 3"  (1.6 m)   Wt 138 lb 6.4 oz (62.8 kg)   SpO2 98%   BMI 24.52 kg/m   Body mass index is 24.52 kg/m.   Repeat blood pressure standing was Similar to the initial reading  No  edema present  ASSESSMENT/ PLAN:   HYPERTENSION: Now  controlled with adding HCTZ 12.5 mg She is also on lisinopril 40 mg and amlodipine, minimally changed kidney function as expected from lowering blood pressure Will continue to monitor renal function and consider reducing amlodipine if needed  Periodically follow microalbumin which is was higher probably from inadequate blood pressure control  DIABETES: She will have FreeStyle libre CGM started and come back for follow-up after this  There are no Patient Instructions on file for this visit.  Karel Turpen 10/17/2015, 3:35 PM

## 2015-10-24 ENCOUNTER — Telehealth: Payer: Self-pay | Admitting: Endocrinology

## 2015-10-24 NOTE — Telephone Encounter (Signed)
Please see message. °

## 2015-10-24 NOTE — Telephone Encounter (Signed)
Patient need a referral to see the dietitian, her appointment is tomorrow.

## 2015-10-24 NOTE — Telephone Encounter (Signed)
Noted  

## 2015-10-24 NOTE — Telephone Encounter (Signed)
This is not a consultation.  This is for starting CGM with Freestyle libre

## 2015-10-25 ENCOUNTER — Encounter: Payer: Medicare Other | Attending: Endocrinology | Admitting: Nutrition

## 2015-10-25 NOTE — Progress Notes (Signed)
A sensor was inserted into her upper outer left arm.  She reported no discomfort.   Due to her blindness, she is not able to keep a food record.  She reports that eats consistently day to day. Bfast: (11-12PM) One day:  1/2 cup applesauce, 1/2 piece of toast, 1 egg and water.  The other day: Raisen bran cereal with water to drink  3PM: rarely will eat package of Nabs or have cottage cheese with fruit.  5:30PM: salad, chicken (4 ounces) 1/2 cup of starchy veg.,and one non starchy veg.  Water to drink, no bread  8:30PM: fruit cup.

## 2015-10-25 NOTE — Patient Instructions (Signed)
Return in 2 weeks for sensor removal

## 2015-11-02 ENCOUNTER — Ambulatory Visit (INDEPENDENT_AMBULATORY_CARE_PROVIDER_SITE_OTHER): Payer: Medicare Other | Admitting: Podiatry

## 2015-11-02 ENCOUNTER — Encounter: Payer: Self-pay | Admitting: Podiatry

## 2015-11-02 VITALS — BP 133/64 | HR 96 | Resp 18

## 2015-11-02 DIAGNOSIS — Q828 Other specified congenital malformations of skin: Secondary | ICD-10-CM | POA: Diagnosis not present

## 2015-11-02 DIAGNOSIS — I739 Peripheral vascular disease, unspecified: Secondary | ICD-10-CM

## 2015-11-02 DIAGNOSIS — M79676 Pain in unspecified toe(s): Secondary | ICD-10-CM | POA: Diagnosis not present

## 2015-11-02 DIAGNOSIS — B351 Tinea unguium: Secondary | ICD-10-CM | POA: Diagnosis not present

## 2015-11-02 NOTE — Patient Instructions (Signed)
Diabetes and Foot Care Diabetes may cause you to have problems because of poor blood supply (circulation) to your feet and legs. This may cause the skin on your feet to become thinner, break easier, and heal more slowly. Your skin may become dry, and the skin may peel and crack. You may also have nerve damage in your legs and feet causing decreased feeling in them. You may not notice minor injuries to your feet that could lead to infections or more serious problems. Taking care of your feet is one of the most important things you can do for yourself.  HOME CARE INSTRUCTIONS  Wear shoes at all times, even in the house. Do not go barefoot. Bare feet are easily injured.  Check your feet daily for blisters, cuts, and redness. If you cannot see the bottom of your feet, use a mirror or ask someone for help.  Wash your feet with warm water (do not use hot water) and mild soap. Then pat your feet and the areas between your toes until they are completely dry. Do not soak your feet as this can dry your skin.  Apply a moisturizing lotion or petroleum jelly (that does not contain alcohol and is unscented) to the skin on your feet and to dry, brittle toenails. Do not apply lotion between your toes.  Trim your toenails straight across. Do not dig under them or around the cuticle. File the edges of your nails with an emery board or nail file.  Do not cut corns or calluses or try to remove them with medicine.  Wear clean socks or stockings every day. Make sure they are not too tight. Do not wear knee-high stockings since they may decrease blood flow to your legs.  Wear shoes that fit properly and have enough cushioning. To break in new shoes, wear them for just a few hours a day. This prevents you from injuring your feet. Always look in your shoes before you put them on to be sure there are no objects inside.  Do not cross your legs. This may decrease the blood flow to your feet.  If you find a minor scrape,  cut, or break in the skin on your feet, keep it and the skin around it clean and dry. These areas may be cleansed with mild soap and water. Do not cleanse the area with peroxide, alcohol, or iodine.  When you remove an adhesive bandage, be sure not to damage the skin around it.  If you have a wound, look at it several times a day to make sure it is healing.  Do not use heating pads or hot water bottles. They may burn your skin. If you have lost feeling in your feet or legs, you may not know it is happening until it is too late.  Make sure your health care provider performs a complete foot exam at least annually or more often if you have foot problems. Report any cuts, sores, or bruises to your health care provider immediately. SEEK MEDICAL CARE IF:   You have an injury that is not healing.  You have cuts or breaks in the skin.  You have an ingrown nail.  You notice redness on your legs or feet.  You feel burning or tingling in your legs or feet.  You have pain or cramps in your legs and feet.  Your legs or feet are numb.  Your feet always feel cold. SEEK IMMEDIATE MEDICAL CARE IF:   There is increasing redness,   swelling, or pain in or around a wound.  There is a red line that goes up your leg.  Pus is coming from a wound.  You develop a fever or as directed by your health care provider.  You notice a bad smell coming from an ulcer or wound.   This information is not intended to replace advice given to you by your health care provider. Make sure you discuss any questions you have with your health care provider.   Document Released: 01/13/2000 Document Revised: 09/17/2012 Document Reviewed: 06/24/2012 Elsevier Interactive Patient Education 2016 [employer]  

## 2015-11-03 NOTE — Progress Notes (Signed)
Patient ID: Julie Brewer, female   DOB: [birth year], 65 y.o.   MRN: [id number]    Subjective: This patient presents again for a scheduled visit complaining of thickened and elongated toenails which are uncomfortable walking wearing shoes as well as a painful plantar callus on the right foot. 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

## 2015-11-05 ENCOUNTER — Other Ambulatory Visit: Payer: Self-pay | Admitting: Endocrinology

## 2015-11-09 ENCOUNTER — Ambulatory Visit: Payer: Medicare Other

## 2015-11-18 ENCOUNTER — Encounter: Payer: Self-pay | Admitting: Endocrinology

## 2015-11-18 ENCOUNTER — Ambulatory Visit (INDEPENDENT_AMBULATORY_CARE_PROVIDER_SITE_OTHER): Payer: Medicare Other | Admitting: Endocrinology

## 2015-11-18 VITALS — BP 130/66 | HR 90 | Temp 97.9°F | Resp 14 | Ht 63.0 in | Wt 141.2 lb

## 2015-11-18 DIAGNOSIS — I10 Essential (primary) hypertension: Secondary | ICD-10-CM

## 2015-11-18 DIAGNOSIS — T383X5A Adverse effect of insulin and oral hypoglycemic [antidiabetic] drugs, initial encounter: Secondary | ICD-10-CM | POA: Diagnosis not present

## 2015-11-18 DIAGNOSIS — E559 Vitamin D deficiency, unspecified: Secondary | ICD-10-CM | POA: Diagnosis not present

## 2015-11-18 DIAGNOSIS — Z794 Long term (current) use of insulin: Secondary | ICD-10-CM | POA: Diagnosis not present

## 2015-11-18 DIAGNOSIS — E1139 Type 2 diabetes mellitus with other diabetic ophthalmic complication: Secondary | ICD-10-CM | POA: Diagnosis not present

## 2015-11-18 DIAGNOSIS — E1165 Type 2 diabetes mellitus with hyperglycemia: Secondary | ICD-10-CM | POA: Diagnosis not present

## 2015-11-18 DIAGNOSIS — H547 Unspecified visual loss: Secondary | ICD-10-CM

## 2015-11-18 DIAGNOSIS — E160 Drug-induced hypoglycemia without coma: Secondary | ICD-10-CM

## 2015-11-18 NOTE — Progress Notes (Signed)
Patient ID: Julie Brewer, female   DOB: 05/06/1950, 65 y.o.   MRN: [id number]   Reason for Appointment: Follow-up  History of Present Illness   Diagnosis: Type 2 DIABETES MELITUS, date of diagnosis:  1985  Prior history: She has been on insulin since diagnosis and on Lantus previously Also at some point had been started on Glucophage several years ago also Because of insurance preference Lantus was changed to Levemir  She refuses to use analog rapid acting insulin because of cost and is using regular insulin for several years   Her blood sugars are generally well controlled and A1c usually under 7%  Recent history:   Insulin regimen: Levemir insulin 25 in the morning once daily. Regular insulin 30 minutes Before eating, 5 units a.m. and 8 ac supper  Oral hypoglycemic drugs: Metformin ER 750 at supper   Her A1c is stable in the upper normal range, last 6.0  She is here for follow-up of her continuous glucose monitoring for 2 weeks which showed the following   CONTINUOUS GLUCOSE MONITORING RECORD INTERPRETATION    Dates of Recording: 9/26 through 11/07/15   Sensor  summary:  Average blood glucose for the period of recording is 125 Blood sugars are within target range between 9 of up to 61 % of the time  Blood sugars are in the low range below 80 anywhere from 0-48 % of the time Blood sugars are above normal between 11 and 53% of the time. Lowest blood sugar on an average was between 2-4 AM and this was 66. Highest blood sugar was between 12 noon-2 PM with an average of 185       Glycemic patterns:  Hyperglycemic episodes are occurring daily but at variable times Most frequent hyperglycemic episodes are after her first meal fairly consistently except a couple of times but occasionally may have a blood sugars spike in the afternoon and evening also at variable times Hypoglycemic episodes occurred fairly regularly overnight except on 4 of the nights of this  recording generally between midnight-4 AM transiently and only with minimal decrease in blood sugar at variable times, mostly earlier late morning and early afternoon     Overnight periods:  Her sugars are generally tending to drop after about 10 PM and in the hypoglycemic range on most of the days as above until about 5 AM     Preprandial periods:  Blood sugars averaging around 100 between 8-10 AM at breakfast and about 160-170 between 4-6 PM for her evening meal     Postprandial periods:  Postprandial spikes are occurring fairly frequently at least once a day with blood sugar often going up between 200-250     Hypoglycemia: This occurs very frequently overnight and also transiently at times between 4-6 PM         Current blood sugar patterns, management and problems:  She was unable to keep her record of her food intake and insulin for the CGM as above since she is visually impaired  She has significant abnormalities of her CGM as follows:  NOCTURNAL HYPOGLYCEMIA is frequent and she is unaware of low blood sugars.  Not clear why she has nocturnal hypoglycemia even though she is taking Levemir only once a day in the morning  POST prandial hyperglycemia is present primarily after breakfast and in the afternoon at times  Has occasional post prandial hyperglycemia after supper  She is still  not checking her blood sugar as her nursing aide does not  do it for her  She is not usually getting protein at breakfast now at all and is not getting any help with monitoring at home  She is compliant with taking her mealtime insulin 30 minutes before eating.  She is frequently not getting any carbohydrate at suppertime and only occasional mashed potatoes  Side effects from medications: None      Monitors blood glucose: None recently Glucometer:  FreeStyle     Meals:  usually 2 meals per day at 10 AM and 5 PM.    Mealtime protein sources: Egg, [place], chicken.  Recently not getting any  protein with her oatmeal and cereal. Avoiding sweet drinks Physical activity: exercise: Just walking within the house              Microalbumin has been recently high   Wt Readings from Last 3 Encounters:  11/18/15 141 lb 3.2 oz (64 kg)  10/17/15 138 lb 6.4 oz (62.8 kg)  09/16/15 141 lb (64 kg)   Lab Results  Component Value Date   HGBA1C 6.0 09/13/2015   HGBA1C 6.4 06/14/2015   HGBA1C 6.3 03/15/2015   Lab Results  Component Value Date   MICROALBUR 80.4 (H) 09/13/2015   LDLCALC 49 09/13/2015   CREATININE 1.35 (H) 10/12/2015        No visits with results within 1 Week(s) from this visit.  Latest known visit with results is:  Lab on 10/12/2015  Component Date Value Ref Range Status  . Sodium 10/12/2015 138  135 - 145 mEq/L Final  . Potassium 10/12/2015 4.3  3.5 - 5.1 mEq/L Final  . Chloride 10/12/2015 110  96 - 112 mEq/L Final  . CO2 10/12/2015 22  19 - 32 mEq/L Final  . Glucose, Bld 10/12/2015 92  70 - 99 mg/dL Final  . BUN 10/12/2015 23  6 - 23 mg/dL Final  . Creatinine, Ser 10/12/2015 1.35* 0.40 - 1.20 mg/dL Final  . Calcium 10/12/2015 9.0  8.4 - 10.5 mg/dL Final  . GFR 10/12/2015 50.93* >60.00 mL/min Final      Medication List       Accurate as of 11/18/15 11:59 PM. Always use your most recent med list.          amLODipine 10 MG tablet Commonly known as:  NORVASC   aspirin 325 MG EC tablet Take 325 mg by mouth daily.   atorvastatin 20 MG tablet Commonly known as:  LIPITOR Take 1 tablet (20 mg total) by mouth daily.   cilostazol 100 MG tablet Commonly known as:  PLETAL Take 1 tablet (100 mg total) by mouth 2 (two) times daily.   gabapentin 100 MG capsule Commonly known as:  NEURONTIN TAKE ONE CAPSULE BY MOUTH TWICE DAILY   glucose blood test strip Commonly known as:  FREESTYLE LITE Use as instructed to check blood sugar 2 times a day dx code E11.9   hydrochlorothiazide 12.5 MG capsule Commonly known as:  MICROZIDE Take 1 capsule (12.5 mg  total) by mouth daily.   insulin regular 100 units/mL injection Commonly known as:  NOVOLIN R,HUMULIN R Inject into the skin 2 (two) times daily before a meal. 3/5 units in the morning and 8 units in the evening.   LEVEMIR 100 UNIT/ML injection Generic drug:  insulin detemir INJECT 56 UNITS INTO THE SKIN IN THE MORNING AND 30 UNITS INTO THE SKIN IN THE EVENING.   lisinopril 40 MG tablet Commonly known as:  PRINIVIL,ZESTRIL   metFORMIN 750 MG 24 hr tablet Commonly  known as:  GLUCOPHAGE-XR TAKE ONE TABLET BY MOUTH ONCE DAILY   omeprazole 20 MG capsule Commonly known as:  PRILOSEC TAKE ONE CAPSULE BY MOUTH ONCE DAILY (DUE FOR OFFICE VISIT THIS MONTH).   ondansetron 4 MG disintegrating tablet Commonly known as:  ZOFRAN-ODT       Allergies:  Allergies  Allergen Reactions  . Morphine And Related Other (See Comments)    Hallucenations   . Penicillins Rash    Swelling    Past Medical History:  Diagnosis Date  . Asthma    No probnlems recently  . 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 Diabetes mellitus    Type II per Dr [name]  (patient said type I)  . GERD (gastroesophageal reflux disease)   . Glaucoma   . Hyperlipidemia   . Hypertension   . Peripheral vascular disease ([place])   . Pneumonia 2006  . Shortness of breath dyspnea    with exdrtion, "Walkling too fast"  . Stroke [facility])    no residual    Past Surgical History:  Procedure Laterality Date  . ABDOMINAL HYSTERECTOMY    . CERVICAL FUSION     with graft from hip  . COLONOSCOPY  July 09, 2012  . DIRECT LARYNGOSCOPY N/A 06/07/2014   Procedure: DIRECT LARYNGOSCOPY with BIOPSY and EXCISION VOLLECULAR CYST;  Surgeon: Ruby Cola, MD;  Location: [facility];  Service: ENT;  Laterality: N/A;  . REFRACTIVE SURGERY Bilateral    both eyes  . SPINE SURGERY     lumbar    Family History  Problem Relation Age of Onset  . Cancer Mother   . Heart disease Mother   . Diabetes  Father   . Cancer Brother   . Cancer Brother     Social History:  reports that she has been smoking Cigarettes.  She has a 30.00 pack-year smoking history. She has never used smokeless tobacco. She reports that she does not drink alcohol or use drugs.  Review of Systems:   Blindness: She has absent vision on the right side and can see silhouettes only on the left.    HYPERTENSION:   taking amlodipine10 mg, HCTZ 12.5 mg and lisinopril 40 mg dose.   Blood pressure appears to be better with adding HCTZ without significant decline in renal function Also has a high urine microalbumin  Lab Results  Component Value Date   CREATININE 1.35 (H) 10/12/2015   BUN 23 10/12/2015   NA 138 10/12/2015   K 4.3 10/12/2015   CL 110 10/12/2015   CO2 22 10/12/2015     HYPERLIPIDEMIA: The lipid abnormality consists of elevated LDL controlled with Lipitor 20 mg .  Her baseline LDL was 202  Lab Results  Component Value Date   CHOL 156 09/13/2015   HDL 82.20 09/13/2015   LDLCALC 49 09/13/2015   TRIG 125.0 09/13/2015   CHOLHDL 2 09/13/2015    Vitamin D: Taking 50,000 unit dosage once a month, Unclear if this is followed by PCP  Lab Results  Component Value Date   VD25OH 31.74 05/31/2014       Last diabetic foot exam was done in 08/2015   Examination:   BP 130/66   Pulse 90   Temp 97.9 F (36.6 C)   Resp 14   Ht 5\' 3"  (1.6 m)   Wt 141 lb 3.2 oz (64 kg)   SpO2 94%   BMI 25.01 kg/m  Body mass index is 25.01 kg/m.    ASSESSMENT/ PLAN:   Diabetes type 2  See history of present illness for detailed discussion of his current management, blood sugar patterns and problems identified  As discussed above her continuous glucose monitoring indicates significant problems with hyperglycemia and hypoglycemia He tends to have significant postprandial hyperglycemia after breakfast and early afternoon Despite taking Levemir only in the morning she has frequent nocturnal hypoglycemia and  decline in blood sugars late at night Diet Review indicates that she has usually no protein at breakfast and very little carbohydrate at suppertime but she is taking more insulin in the morning than in the evening  She does not want to pay for brand name rapid acting insulin which probably would be more appropriate for her especially at breakfast. Recommendations today:  Will stop metformin as this may be causing tendency to low sugars overnight  Reduce Levemir by 5 units  She will increase her insulin to 8 units for mealtime coverage in the morning and reduce it at suppertime to 5 units unless eating any carbohydrate and this was discussed in detail  She will have her aide follow instructions that were printed out  Encouraged her to try to check blood sugars occasionally  He will benefit from repeat continuous glucose monitoring in 3-4 months  HYPERTENSION: Now  controlled She is on lisinopril 40 mg and amlodipine, HCTZ Needs follow-up renal function on the next visit   Patient Instructions  TAKE EGG OR LEAN MEAT IN AM   STOP METFORMIN  LEVEMIR 20 UNITS  REG INSULIN 8 IN AM AND 5 AT DINNER ( WITH potatoes take 7 units)     Navya Timmons 11/19/2015, 4:03 PM   Counseling time on subjects discussed above is over 50% of today's 25 minute visit

## 2015-11-18 NOTE — Patient Instructions (Signed)
TAKE EGG OR LEAN MEAT IN AM   STOP METFORMIN  LEVEMIR 20 UNITS  REG INSULIN 8 IN AM AND 5 AT DINNER ( WITH potatoes take 7 units)

## 2015-12-26 ENCOUNTER — Other Ambulatory Visit: Payer: Self-pay | Admitting: Endocrinology

## 2016-01-13 ENCOUNTER — Telehealth: Payer: Self-pay | Admitting: Cardiovascular Disease

## 2016-01-13 NOTE — Telephone Encounter (Signed)
Received records from [facility] for appointment on 01/16/16 with Dr Gwenlyn Found.  Records given to [facility] (medical records) for Dr Kennon Holter schedule on 01/16/16. lp

## 2016-01-16 ENCOUNTER — Ambulatory Visit (INDEPENDENT_AMBULATORY_CARE_PROVIDER_SITE_OTHER): Payer: Medicare Other | Admitting: Cardiovascular Disease

## 2016-01-16 ENCOUNTER — Encounter: Payer: Self-pay | Admitting: Cardiovascular Disease

## 2016-01-16 VITALS — BP 140/62 | HR 99 | Ht 63.0 in | Wt 142.0 lb

## 2016-01-16 DIAGNOSIS — R55 Syncope and collapse: Secondary | ICD-10-CM | POA: Insufficient documentation

## 2016-01-16 DIAGNOSIS — I10 Essential (primary) hypertension: Secondary | ICD-10-CM

## 2016-01-16 NOTE — Assessment & Plan Note (Signed)
History of hypertension blood pressure measures 140/62. She is on amlodipine, lisinopril and hydrochlorothiazide. Continue current meds at current dosing

## 2016-01-16 NOTE — Progress Notes (Signed)
01/16/2016 Julie Brewer   [birth year]  [id number]  Primary Physician Jonathon Bellows, MD Primary Cardiologist: Lorretta Harp MD Renae Gloss  HPI:  [name] is a 65 year old moderately overweight separated African-American female mother of one child, grandmother 4 grandchildren referred by her PCP, [name], for evaluation of a recent episode of syncope. Risk factors include 50-pack-years of tobacco abuse continued to smoke one pack per day. She has history of hypertension, diabetes and hyperlipidemia. She has never had a heart attack but does have a history of "TIAs. She denies chest pain but does get short of breath. She has peripheral arterial disease and is followed by Dr. Trula Slade for claudication which he is minimally symptomatic from. Approximately one week ago she was witnessed to have lost consciousness while sitting in a chair for 45 minutes. There was no loss of bowel or bladder control or tonic clonic activity. We do not know her serum glucose level at that time. She did have some nausea and vomiting after she woke up.   Current Outpatient Prescriptions  Medication Sig Dispense Refill  . amLODipine (NORVASC) 10 MG tablet Take 10 mg by mouth daily.     Marland Kitchen aspirin 325 MG EC tablet Take 325 mg by mouth daily.    Marland Kitchen atorvastatin (LIPITOR) 20 MG tablet Take 1 tablet (20 mg total) by mouth daily. 90 tablet 3  . cilostazol (PLETAL) 100 MG tablet Take 100 mg by mouth 2 (two) times daily.    Marland Kitchen gabapentin (NEURONTIN) 100 MG capsule TAKE ONE CAPSULE BY MOUTH TWICE DAILY 180 capsule 1  . glucose blood (FREESTYLE LITE) test strip Use as instructed to check blood sugar 2 times a day dx code E11.9 200 each 1  . hydrochlorothiazide (MICROZIDE) 12.5 MG capsule Take 1 capsule (12.5 mg total) by mouth daily. 90 capsule 1  . insulin regular (NOVOLIN R,HUMULIN R) 100 units/mL injection Inject into the skin 2 (two) times daily before a meal. 8 units in the morning and 5 units in  the evening.    Marland Kitchen LEVEMIR 100 UNIT/ML injection INJECT 56 UNITS INTO THE SKIN IN THE MORNING AND 30 UNITS INTO THE SKIN IN THE EVENING. (Patient taking differently: IINJECT 20 UNITS DAILY) 30 mL 3  . lisinopril (PRINIVIL,ZESTRIL) 40 MG tablet Take 40 mg by mouth daily.     Marland Kitchen omeprazole (PRILOSEC) 20 MG capsule TAKE ONE CAPSULE BY MOUTH ONCE DAILY (DUE FOR OFFICE VISIT THIS MONTH). 90 capsule 1  . Vitamin D, Ergocalciferol, (DRISDOL) 50000 units CAPS capsule TAKE ONE CAPSULE BY MOUTH EVERY 7 DAYS (Patient taking differently: TAKE ONE CAPSULE BY MOUTH  ONCE  A MONTH) 12 capsule 0  . metFORMIN (GLUCOPHAGE-XR) 750 MG 24 hr tablet TAKE ONE TABLET BY MOUTH ONCE DAILY (Patient not taking: Reported on 01/16/2016) 90 tablet 0   No current facility-administered medications for this visit.     Allergies  Allergen Reactions  . Morphine And Related Other (See Comments)    Hallucenations   . Penicillins Rash    Swelling    Social History   Social History  . Marital status: Legally Separated    Spouse name: N/A  . Number of children: N/A  . Years of education: N/A   Occupational History  . Not on file.   Social History Main Topics  . Smoking status: Current Every Day Smoker    Packs/day: 1.00    Years: 30.00    Types: Cigarettes  . Smokeless  tobacco: Never Used  . Alcohol use No  . Drug use: No  . Sexual activity: Not on file   Other Topics Concern  . Not on file   Social History Narrative  . No narrative on file     Review of Systems: General: negative for chills, fever, night sweats or weight changes.  Cardiovascular: negative for chest pain, dyspnea on exertion, edema, orthopnea, palpitations, paroxysmal nocturnal dyspnea or shortness of breath Dermatological: negative for rash Respiratory: negative for cough or wheezing Urologic: negative for hematuria Abdominal: negative for nausea, vomiting, diarrhea, bright red blood per rectum, melena, or hematemesis Neurologic: negative  for visual changes, syncope, or dizziness All other systems reviewed and are otherwise negative except as noted above.    Blood pressure 140/62, pulse 99, height 5\' 3"  (1.6 m), weight 142 lb (64.4 kg).  General appearance: alert and no distress Neck: no adenopathy, no carotid bruit, no JVD, supple, symmetrical, trachea midline and thyroid not enlarged, symmetric, no tenderness/mass/nodules Lungs: clear to auscultation bilaterally Heart: regular rate and rhythm, S1, S2 normal, no murmur, click, rub or gallop Extremities: extremities normal, atraumatic, no cyanosis or edema  EKG sinus rhythm at 99 with septal Q waves. I personally reviewed this EKG  ASSESSMENT AND PLAN:   Atherosclerosis of native arteries of the extremities with intermittent claudication History of intermittent claudication followed as an outpatient by Dr. Trula Slade with Dopplers performed 09/12/15 revealing a right ABI of 1 left ABI 0.72.  Essential hypertension History of hypertension blood pressure measures 140/62. She is on amlodipine, lisinopril and hydrochlorothiazide. Continue current meds at current dosing  Syncope [name] was referred by her PCP, [name], for evaluation of a recent episode of witnessed syncope. She has never had syncope previous to this. She was sitting in a chair and apparently had been tired off and on all day and had witnessed loss of consciousness by her friend who was there at that time. She regained consciousness 45 minutes later. There was no seizure activity or loss of bowel or bladder control. We are aware of her serum glucose at that time. At this point, I'm not going to get an event monitor. She has recurrent syncope she will need a more thorough workup. She also does not drive is not an issue.      Lorretta Harp MD FACP,FACC,FAHA, [facility] 01/16/2016 10:38 AM

## 2016-01-16 NOTE — Assessment & Plan Note (Signed)
[name] was referred by her PCP, [name], for evaluation of a recent episode of witnessed syncope. She has never had syncope previous to this. She was sitting in a chair and apparently had been tired off and on all day and had witnessed loss of consciousness by her friend who was there at that time. She regained consciousness 45 minutes later. There was no seizure activity or loss of bowel or bladder control. We are aware of her serum glucose at that time. At this point, I'm not going to get an event monitor. She has recurrent syncope she will need a more thorough workup. She also does not drive is not an issue.

## 2016-01-16 NOTE — Patient Instructions (Signed)

## 2016-01-16 NOTE — Assessment & Plan Note (Signed)
History of intermittent claudication followed as an outpatient by Dr. Trula Slade with Dopplers performed 09/12/15 revealing a right ABI of 1 left ABI 0.72.

## 2016-01-27 ENCOUNTER — Other Ambulatory Visit (INDEPENDENT_AMBULATORY_CARE_PROVIDER_SITE_OTHER): Payer: Medicare Other

## 2016-01-27 DIAGNOSIS — E559 Vitamin D deficiency, unspecified: Secondary | ICD-10-CM | POA: Diagnosis not present

## 2016-01-27 DIAGNOSIS — E1165 Type 2 diabetes mellitus with hyperglycemia: Secondary | ICD-10-CM

## 2016-01-27 DIAGNOSIS — Z794 Long term (current) use of insulin: Secondary | ICD-10-CM | POA: Diagnosis not present

## 2016-01-27 LAB — URINALYSIS, ROUTINE W REFLEX MICROSCOPIC
Bilirubin Urine: NEGATIVE
Hgb urine dipstick: NEGATIVE
KETONES UR: NEGATIVE
Leukocytes, UA: NEGATIVE
Nitrite: NEGATIVE
PH: 6 (ref 5.0–8.0)
RBC / HPF: NONE SEEN (ref 0–?)
SPECIFIC GRAVITY, URINE: 1.015 (ref 1.000–1.030)
Total Protein, Urine: 30 — AB
URINE GLUCOSE: NEGATIVE
UROBILINOGEN UA: 0.2 (ref 0.0–1.0)

## 2016-01-27 LAB — MICROALBUMIN / CREATININE URINE RATIO
CREATININE, U: 124.4 mg/dL
MICROALB UR: 43.7 mg/dL — AB (ref 0.0–1.9)
MICROALB/CREAT RATIO: 35.1 mg/g — AB (ref 0.0–30.0)

## 2016-01-27 LAB — BASIC METABOLIC PANEL
BUN: 23 mg/dL (ref 6–23)
CHLORIDE: 107 meq/L (ref 96–112)
CO2: 22 mEq/L (ref 19–32)
CREATININE: 1.58 mg/dL — AB (ref 0.40–1.20)
Calcium: 9.4 mg/dL (ref 8.4–10.5)
GFR: 42.44 mL/min — AB (ref >60.00)
GLUCOSE: 156 mg/dL — AB (ref 70–99)
POTASSIUM: 4.7 meq/L (ref 3.5–5.1)
Sodium: 135 mEq/L (ref 135–145)

## 2016-01-27 LAB — HEMOGLOBIN A1C: Hgb A1c MFr Bld: 8 % — ABNORMAL HIGH (ref 4.6–6.5)

## 2016-01-27 LAB — VITAMIN D 25 HYDROXY (VIT D DEFICIENCY, FRACTURES): VITD: 28.68 ng/mL — AB (ref 30.00–100.00)

## 2016-02-01 ENCOUNTER — Ambulatory Visit (INDEPENDENT_AMBULATORY_CARE_PROVIDER_SITE_OTHER): Payer: Medicare HMO | Admitting: Podiatry

## 2016-02-01 ENCOUNTER — Encounter: Payer: Self-pay | Admitting: Podiatry

## 2016-02-01 VITALS — BP 151/61 | HR 108 | Resp 18

## 2016-02-01 DIAGNOSIS — B351 Tinea unguium: Secondary | ICD-10-CM | POA: Diagnosis not present

## 2016-02-01 DIAGNOSIS — Q828 Other specified congenital malformations of skin: Secondary | ICD-10-CM

## 2016-02-01 DIAGNOSIS — M79676 Pain in unspecified toe(s): Secondary | ICD-10-CM

## 2016-02-01 DIAGNOSIS — E1151 Type 2 diabetes mellitus with diabetic peripheral angiopathy without gangrene: Secondary | ICD-10-CM | POA: Diagnosis not present

## 2016-02-01 NOTE — Patient Instructions (Signed)

## 2016-02-02 ENCOUNTER — Encounter: Payer: Self-pay | Admitting: Endocrinology

## 2016-02-02 ENCOUNTER — Ambulatory Visit (INDEPENDENT_AMBULATORY_CARE_PROVIDER_SITE_OTHER): Payer: Medicare HMO | Admitting: Endocrinology

## 2016-02-02 VITALS — BP 124/64 | HR 86 | Ht 63.0 in | Wt 147.8 lb

## 2016-02-02 DIAGNOSIS — Z794 Long term (current) use of insulin: Secondary | ICD-10-CM | POA: Diagnosis not present

## 2016-02-02 DIAGNOSIS — E1165 Type 2 diabetes mellitus with hyperglycemia: Secondary | ICD-10-CM | POA: Diagnosis not present

## 2016-02-02 LAB — GLUCOSE, POCT (MANUAL RESULT ENTRY): POC Glucose: 266 mg/dl — AB (ref 70–99)

## 2016-02-02 NOTE — Progress Notes (Signed)
Patient ID: Julie Brewer, female   DOB: 07/01/1950, 66 y.o.   MRN: [id number]   Subjective: This patient presents again for a scheduled visit complaining of thickened and elongated toenails which are uncomfortable walking wearing shoes as well as a painful plantar callus on the right foot. 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

## 2016-02-02 NOTE — Progress Notes (Signed)
Patient ID: Julie Brewer, female   DOB: Oct 14, 1950, 66 y.o.   MRN: [id number]   Reason for Appointment: Follow-up  History of Present Illness   Diagnosis: Type 2 DIABETES MELITUS, date of diagnosis:  1985  Prior history: She has been on insulin since diagnosis and on Lantus previously Also at some point had been started on Glucophage several years ago also Because of insurance preference Lantus was changed to Levemir  She refuses to use analog rapid acting insulin because of cost and is using regular insulin for several years   Her blood sugars are generally well controlled and A1c usually under 7%  Recent history:   Insulin regimen: Levemir insulin 20 in the morning once daily. Regular insulin 30 minutes Before eating, 8 units a.m. and 5-7 ac supper  Oral hypoglycemic drugs: not Metformin ER 750 at supper   Her A1c is much higher at 8%, previously 6%   Current blood sugar patterns, management and problems:  She was unable to check her blood sugar again as she does not have any help at home  With reducing her Levemir by 5 units to avoid nocturnal hypoglycemia her fasting blood sugar in the lab is 156  She was also taken off metformin because of potential for hypoglycemia with this in combination with insulin  However today her blood sugar is 266 after eating her usual cereal.  She says she cannot change her breakfast because she cannot cook any food because of her visual problems  She is compliant with taking her mealtime insulin 30 minutes before eating.  She was told to get some protein at suppertime consistently and she will take 2 more units if eating any carbohydrate   She frequently does not have symptoms with hypoglycemia  Side effects from medications: None      Monitors blood glucose: Not at all  Glucometer:  FreeStyle     Meals:  usually 2 meals per day at 10 AM and 5 PM.    Mealtime protein sources:turkey, chicken.  Eating cereal for  breakfast Avoiding sweet drinks Physical activity: exercise: Just walking within the house              Microalbumin has been mildly increased   Wt Readings from Last 3 Encounters:  02/02/16 147 lb 12.8 oz (67 kg)  01/16/16 142 lb (64.4 kg)  11/18/15 141 lb 3.2 oz (64 kg)   Lab Results  Component Value Date   HGBA1C 8.0 (H) 01/27/2016   HGBA1C 6.0 09/13/2015   HGBA1C 6.4 06/14/2015   Lab Results  Component Value Date   MICROALBUR 43.7 (H) 01/27/2016   LDLCALC 49 09/13/2015   CREATININE 1.58 (H) 01/27/2016        Office Visit on 02/02/2016  Component Date Value Ref Range Status  . POC Glucose 02/02/2016 266* 70 - 99 mg/dl Final  Lab on 01/27/2016  Component Date Value Ref Range Status  . Hgb A1c MFr Bld 01/27/2016 8.0* 4.6 - 6.5 % Final  . Sodium 01/27/2016 135  135 - 145 mEq/L Final  . Potassium 01/27/2016 4.7  3.5 - 5.1 mEq/L Final  . Chloride 01/27/2016 107  96 - 112 mEq/L Final  . CO2 01/27/2016 22  19 - 32 mEq/L Final  . Glucose, Bld 01/27/2016 156* 70 - 99 mg/dL Final  . BUN 01/27/2016 23  6 - 23 mg/dL Final  . Creatinine, Ser 01/27/2016 1.58* 0.40 - 1.20 mg/dL Final  . Calcium 01/27/2016 9.4  8.4 -  10.5 mg/dL Final  . GFR 01/27/2016 42.44* >60.00 mL/min Final  . VITD 01/27/2016 28.68* 30.00 - 100.00 ng/mL Final  . Microalb, Ur 01/27/2016 43.7* 0.0 - 1.9 mg/dL Final  . Creatinine,U 01/27/2016 124.4  mg/dL Final  . Microalb Creat Ratio 01/27/2016 35.1* 0.0 - 30.0 mg/g Final  . Color, Urine 01/27/2016 YELLOW  Yellow;Lt. Yellow Final  . APPearance 01/27/2016 CLEAR  Clear Final  . Specific Gravity, Urine 01/27/2016 1.015  1.000 - 1.030 Final  . pH 01/27/2016 6.0  5.0 - 8.0 Final  . Total Protein, Urine 01/27/2016 30* Negative Final  . Urine Glucose 01/27/2016 NEGATIVE  Negative Final  . Ketones, ur 01/27/2016 NEGATIVE  Negative Final  . Bilirubin Urine 01/27/2016 NEGATIVE  Negative Final  . Hgb urine dipstick 01/27/2016 NEGATIVE  Negative Final  . Urobilinogen,  UA 01/27/2016 0.2  0.0 - 1.0 Final  . Leukocytes, UA 01/27/2016 NEGATIVE  Negative Final  . Nitrite 01/27/2016 NEGATIVE  Negative Final  . WBC, UA 01/27/2016 0-2/hpf  0-2/hpf Final  . RBC / HPF 01/27/2016 none seen  0-2/hpf Final  . Squamous Epithelial / LPF 01/27/2016 Rare(0-4/hpf)  Rare(0-4/hpf) Final    Allergies as of 02/02/2016      Reactions   Morphine And Related Other (See Comments)   Hallucenations    Penicillins Rash   Swelling      Medication List       Accurate as of 02/02/16 10:45 AM. Always use your most recent med list.          amLODipine 10 MG tablet Commonly known as:  NORVASC Take 10 mg by mouth daily.   aspirin 325 MG EC tablet Take 325 mg by mouth daily.   atorvastatin 20 MG tablet Commonly known as:  LIPITOR Take 1 tablet (20 mg total) by mouth daily.   cilostazol 100 MG tablet Commonly known as:  PLETAL Take 100 mg by mouth 2 (two) times daily.   gabapentin 100 MG capsule Commonly known as:  NEURONTIN TAKE ONE CAPSULE BY MOUTH TWICE DAILY   glucose blood test strip Commonly known as:  FREESTYLE LITE Use as instructed to check blood sugar 2 times a day dx code E11.9   hydrochlorothiazide 12.5 MG capsule Commonly known as:  MICROZIDE Take 1 capsule (12.5 mg total) by mouth daily.   insulin regular 250 units/2.25mL (100 units/mL) injection Commonly known as:  NOVOLIN R,HUMULIN R Inject into the skin 2 (two) times daily before a meal. 8 units in the morning and 5 units in the evening.   IRON PO Take by mouth.   LEVEMIR 100 UNIT/ML injection Generic drug:  insulin detemir INJECT 56 UNITS INTO THE SKIN IN THE MORNING AND 30 UNITS INTO THE SKIN IN THE EVENING.   lisinopril 40 MG tablet Commonly known as:  PRINIVIL,ZESTRIL Take 40 mg by mouth daily.   metFORMIN 750 MG 24 hr tablet Commonly known as:  GLUCOPHAGE-XR TAKE ONE TABLET BY MOUTH ONCE DAILY   omeprazole 20 MG capsule Commonly known as:  PRILOSEC TAKE ONE CAPSULE BY MOUTH ONCE  DAILY (DUE FOR OFFICE VISIT THIS MONTH).   Vitamin D (Ergocalciferol) 50000 units Caps capsule Commonly known as:  DRISDOL TAKE ONE CAPSULE BY MOUTH EVERY 7 DAYS       Allergies:  Allergies  Allergen Reactions  . Morphine And Related Other (See Comments)    Hallucenations   . Penicillins Rash    Swelling    Past Medical History:  Diagnosis Date  . Asthma  No probnlems recently  . 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 Diabetes mellitus    Type II per Dr [name]  (patient said type I)  . GERD (gastroesophageal reflux disease)   . Glaucoma   . Hyperlipidemia   . Hypertension   . Peripheral vascular disease ([place])   . Pneumonia 2006  . Shortness of breath dyspnea    with exdrtion, "Walkling too fast"  . Stroke [facility]    no residual    Past Surgical History:  Procedure Laterality Date  . ABDOMINAL HYSTERECTOMY    . CERVICAL FUSION     with graft from hip  . COLONOSCOPY  July 09, 2012  . DIRECT LARYNGOSCOPY N/A 06/07/2014   Procedure: DIRECT LARYNGOSCOPY with BIOPSY and EXCISION VOLLECULAR CYST;  Surgeon: Ruby Cola, MD;  Location: [place];  Service: ENT;  Laterality: N/A;  . REFRACTIVE SURGERY Bilateral    both eyes  . SPINE SURGERY     lumbar    Family History  Problem Relation Age of Onset  . Cancer Mother   . Heart disease Mother   . Diabetes Father   . Cancer Brother   . Cancer Brother     Social History:  reports that she has been smoking Cigarettes.  She has a 30.00 pack-year smoking history. She has never used smokeless tobacco. She reports that she does not drink alcohol or use drugs.  Review of Systems:   Blindness: She has absent vision on the right side and can see silhouettes only on the left.    HYPERTENSION:   taking amlodipine10 mg, HCTZ 12.5 mg and lisinopril 40 mg dose.    Renal function: Her creatinine is relatively higher and not clear why.  She is being referred by her PCP to  nephrologist this month  Lab Results  Component Value Date   CREATININE 1.58 (H) 01/27/2016   BUN 23 01/27/2016   NA 135 01/27/2016   K 4.7 01/27/2016   CL 107 01/27/2016   CO2 22 01/27/2016     HYPERLIPIDEMIA: The lipid abnormality consists of elevated LDL controlled with Lipitor 20 mg .  Her baseline LDL was 202  Lab Results  Component Value Date   CHOL 156 09/13/2015   HDL 82.20 09/13/2015   LDLCALC 49 09/13/2015   TRIG 125.0 09/13/2015   CHOLHDL 2 09/13/2015    Vitamin D: Taking 50,000 unit dosage once a month, Unclear if this is followed by PCP  Lab Results  Component Value Date   VD25OH 28.68 (L) 01/27/2016       Last diabetic foot exam was done in 08/2015   Examination:   BP 124/64   Pulse 86   Ht 5\' 3"  (1.6 m)   Wt 147 lb 12.8 oz (67 kg)   SpO2 97%   BMI 26.18 kg/m   Body mass index is 26.18 kg/m.    ASSESSMENT/ PLAN:   Diabetes type 2  See history of present illness for detailed discussion of current management, blood sugar patterns and problems identified  Her blood sugars are now only controlled with A1c going up to 8% This is apparently occurring after being taken off metformin and reducing Levemir by 5 units Most likely she was benefiting from metformin since she appears to be having high postprandial reading today also after breakfast Fasting reading is 156 in the lab and most likely does not have significant fasting hyperglycemia Again difficult to  assess her readings and patterns because of lack of glucose monitoring at home She also is eating cereal in the morning and this is likely to be causing more hyperglycemia which she is not able to change this  Recommendations today:  Will restart her 750 mg metformin as this may help with overall control including at mealtimes, renal function is adequate for this dose  No change in Levemir and hopefully will not have any low sugars overnight with continuing same dose of Levemir  Continue regular  insulin twice a day but increase the morning dose by 2 units at least  Will do short-term follow-up in 2 months  She will benefit from repeat continuous glucose monitoring in 3-4 months  HYPERTENSION: Now  controlled  Renal dysfunction: She will discuss with nephrologist    Patient Instructions  10 Regular at [place] Metformin     Kaire Stary 02/02/2016, 10:45 AM

## 2016-02-02 NOTE — Patient Instructions (Addendum)
10 Regular at [facility]  Restart Metformin

## 2016-02-06 ENCOUNTER — Other Ambulatory Visit: Payer: Self-pay | Admitting: Endocrinology

## 2016-02-07 DIAGNOSIS — Z972 Presence of dental prosthetic device (complete) (partial): Secondary | ICD-10-CM | POA: Diagnosis not present

## 2016-02-07 DIAGNOSIS — D509 Iron deficiency anemia, unspecified: Secondary | ICD-10-CM | POA: Diagnosis not present

## 2016-02-07 DIAGNOSIS — Z6826 Body mass index (BMI) 26.0-26.9, adult: Secondary | ICD-10-CM | POA: Diagnosis not present

## 2016-02-07 DIAGNOSIS — E785 Hyperlipidemia, unspecified: Secondary | ICD-10-CM | POA: Diagnosis not present

## 2016-02-07 DIAGNOSIS — E1151 Type 2 diabetes mellitus with diabetic peripheral angiopathy without gangrene: Secondary | ICD-10-CM | POA: Diagnosis not present

## 2016-02-07 DIAGNOSIS — H21563 Pupillary abnormality, bilateral: Secondary | ICD-10-CM | POA: Diagnosis not present

## 2016-02-07 DIAGNOSIS — K08109 Complete loss of teeth, unspecified cause, unspecified class: Secondary | ICD-10-CM | POA: Diagnosis not present

## 2016-02-07 DIAGNOSIS — Z79899 Other long term (current) drug therapy: Secondary | ICD-10-CM | POA: Diagnosis not present

## 2016-02-07 DIAGNOSIS — R69 Illness, unspecified: Secondary | ICD-10-CM | POA: Diagnosis not present

## 2016-02-07 DIAGNOSIS — E559 Vitamin D deficiency, unspecified: Secondary | ICD-10-CM | POA: Diagnosis not present

## 2016-02-07 DIAGNOSIS — I10 Essential (primary) hypertension: Secondary | ICD-10-CM | POA: Diagnosis not present

## 2016-02-07 DIAGNOSIS — Z7982 Long term (current) use of aspirin: Secondary | ICD-10-CM | POA: Diagnosis not present

## 2016-02-07 DIAGNOSIS — Z0000 Encounter for general adult medical examination without abnormal findings: Secondary | ICD-10-CM | POA: Diagnosis not present

## 2016-02-07 DIAGNOSIS — E1140 Type 2 diabetes mellitus with diabetic neuropathy, unspecified: Secondary | ICD-10-CM | POA: Diagnosis not present

## 2016-02-07 DIAGNOSIS — Z794 Long term (current) use of insulin: Secondary | ICD-10-CM | POA: Diagnosis not present

## 2016-02-28 DIAGNOSIS — N183 Chronic kidney disease, stage 3 (moderate): Secondary | ICD-10-CM | POA: Diagnosis not present

## 2016-02-28 DIAGNOSIS — E1129 Type 2 diabetes mellitus with other diabetic kidney complication: Secondary | ICD-10-CM | POA: Diagnosis not present

## 2016-02-28 DIAGNOSIS — D509 Iron deficiency anemia, unspecified: Secondary | ICD-10-CM | POA: Diagnosis not present

## 2016-02-29 ENCOUNTER — Other Ambulatory Visit: Payer: Self-pay | Admitting: Nephrology

## 2016-02-29 DIAGNOSIS — N183 Chronic kidney disease, stage 3 unspecified: Secondary | ICD-10-CM

## 2016-03-02 ENCOUNTER — Ambulatory Visit
Admission: RE | Admit: 2016-03-02 | Discharge: 2016-03-02 | Disposition: A | Discharge status: Home / Self Care | Payer: Medicare HMO | Source: Ambulatory Visit | Attending: Nephrology | Admitting: Nephrology

## 2016-03-02 DIAGNOSIS — N183 Chronic kidney disease, stage 3 unspecified: Secondary | ICD-10-CM

## 2016-03-03 ENCOUNTER — Other Ambulatory Visit: Payer: Self-pay | Admitting: Endocrinology

## 2016-03-07 ENCOUNTER — Telehealth: Payer: Self-pay | Admitting: Nurse Practitioner

## 2016-03-07 NOTE — Telephone Encounter (Signed)
Appt has been scheduled for the pt to see Ned Card on 2/9 at 215pm. Pt aware to arrive 30 minutes early. Demographics verified.

## 2016-03-09 ENCOUNTER — Encounter: Payer: Self-pay | Admitting: Endocrinology

## 2016-03-09 ENCOUNTER — Other Ambulatory Visit: Payer: Self-pay | Admitting: *Deleted

## 2016-03-09 ENCOUNTER — Other Ambulatory Visit: Payer: Self-pay | Admitting: Nurse Practitioner

## 2016-03-09 ENCOUNTER — Ambulatory Visit (HOSPITAL_BASED_OUTPATIENT_CLINIC_OR_DEPARTMENT_OTHER): Payer: Medicare HMO

## 2016-03-09 ENCOUNTER — Telehealth: Payer: Self-pay | Admitting: Endocrinology

## 2016-03-09 ENCOUNTER — Encounter: Payer: Self-pay | Admitting: Nurse Practitioner

## 2016-03-09 ENCOUNTER — Ambulatory Visit (HOSPITAL_BASED_OUTPATIENT_CLINIC_OR_DEPARTMENT_OTHER): Payer: Medicare HMO | Admitting: Nurse Practitioner

## 2016-03-09 VITALS — BP 136/57 | HR 102 | Temp 98.2°F | Resp 19 | Wt 144.7 lb

## 2016-03-09 DIAGNOSIS — E119 Type 2 diabetes mellitus without complications: Secondary | ICD-10-CM | POA: Diagnosis not present

## 2016-03-09 DIAGNOSIS — H409 Unspecified glaucoma: Secondary | ICD-10-CM

## 2016-03-09 DIAGNOSIS — R718 Other abnormality of red blood cells: Secondary | ICD-10-CM

## 2016-03-09 DIAGNOSIS — N183 Chronic kidney disease, stage 3 (moderate): Secondary | ICD-10-CM | POA: Diagnosis not present

## 2016-03-09 DIAGNOSIS — D509 Iron deficiency anemia, unspecified: Secondary | ICD-10-CM | POA: Diagnosis not present

## 2016-03-09 DIAGNOSIS — D649 Anemia, unspecified: Secondary | ICD-10-CM

## 2016-03-09 DIAGNOSIS — G629 Polyneuropathy, unspecified: Secondary | ICD-10-CM

## 2016-03-09 DIAGNOSIS — I10 Essential (primary) hypertension: Secondary | ICD-10-CM

## 2016-03-09 DIAGNOSIS — E1165 Type 2 diabetes mellitus with hyperglycemia: Secondary | ICD-10-CM | POA: Diagnosis not present

## 2016-03-09 HISTORY — DX: Iron deficiency anemia, unspecified: D50.9

## 2016-03-09 LAB — CBC WITH DIFFERENTIAL/PLATELET
BASO%: 0.4 % (ref 0.0–2.0)
BASOS ABS: 0 10*3/uL (ref 0.0–0.1)
EOS ABS: 0.9 10*3/uL — AB (ref 0.0–0.5)
EOS%: 8.8 % — ABNORMAL HIGH (ref 0.0–7.0)
HEMATOCRIT: 34.9 % (ref 34.8–46.6)
HGB: 11.3 g/dL — ABNORMAL LOW (ref 11.6–15.9)
LYMPH#: 1.2 10*3/uL (ref 0.9–3.3)
LYMPH%: 11.9 % — ABNORMAL LOW (ref 14.0–49.7)
MCH: 25.8 pg (ref 25.1–34.0)
MCHC: 32.4 g/dL (ref 31.5–36.0)
MCV: 79.7 fL (ref 79.5–101.0)
MONO#: 0.9 10*3/uL (ref 0.1–0.9)
MONO%: 8.7 % (ref 0.0–14.0)
NEUT#: 7.3 10*3/uL — ABNORMAL HIGH (ref 1.5–6.5)
NEUT%: 70.2 % (ref 38.4–76.8)
PLATELETS: 263 10*3/uL (ref 145–400)
RBC: 4.38 10*6/uL (ref 3.70–5.45)
RDW: 17.1 % — ABNORMAL HIGH (ref 11.2–14.5)
WBC: 10.4 10*3/uL — ABNORMAL HIGH (ref 3.9–10.3)

## 2016-03-09 LAB — URINALYSIS, MICROSCOPIC - CHCC
Bilirubin (Urine): NEGATIVE
Glucose: NEGATIVE mg/dL
KETONES: NEGATIVE mg/dL
Leukocyte Esterase: NEGATIVE
Nitrite: NEGATIVE
PROTEIN: 30 mg/dL
Specific Gravity, Urine: 1.01 (ref 1.003–1.035)
Urobilinogen, UR: 0.2 mg/dL (ref 0.2–1)
pH: 6 (ref 4.6–8.0)

## 2016-03-09 LAB — FERRITIN: FERRITIN: 104 ng/mL (ref 9–269)

## 2016-03-09 LAB — CHCC SMEAR

## 2016-03-09 LAB — DRAW EXTRA CLOT TUBE

## 2016-03-09 NOTE — Progress Notes (Addendum)
[facility] New Patient Consult   Referring MD: [name], Md [address] [place], [place] [address]   Julie Brewer 66 y.o.  1950/04/27    Reason for Referral: Anemia   HPI: Julie Brewer is a 66 year old woman referred for evaluation of anemia. Past medical history is significant for hypertension, diabetes, chronic renal failure ("stage III"), glaucoma with blindness and history of a TIA.  She reports beginning oral iron in December 2017. Labs from 01/09/2016 show a hemoglobin of 11.9, MCV 81, RDW 17.3, white count 9.9, platelet count 279,000 and ferritin 6.9. She began a trial of oral iron (not sure of preparation) once a day. Follow-up ferritin on 02/28/2016 returned at 9.7.  She is not aware of any bleeding but due to blindness cannot be sure. She has noted constipation intermittently over the past year. She takes 2 Colace a day. She reports a negative colonoscopy about 3 years ago. Energy level is poor. Appetite is unchanged as compared to baseline. She thinks her weight is stable. No fevers or sweats. She has stable mild dyspnea on exertion. No chest pain. No dysphagia. No nausea or vomiting. No dysuria.  Past Medical History:  Diagnosis Date  . Asthma    No probnlems recently  . 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 Diabetes mellitus    Type II per Dr [name]  (patient said type I)  . GERD (gastroesophageal reflux disease)   . Glaucoma   . Hyperlipidemia   . Hypertension   . Iron deficiency anemia 03/09/2016  . Peripheral vascular disease ([place])   . Pneumonia 2006  . Shortness of breath dyspnea    with exertion, "Walkling too fast"  . Stroke [facility])    no residual    Past Surgical History:  Procedure Laterality Date  . ABDOMINAL HYSTERECTOMY    . CERVICAL FUSION     with graft from hip  . COLONOSCOPY  July 09, 2012  . DIRECT LARYNGOSCOPY N/A 06/07/2014   Procedure: DIRECT  LARYNGOSCOPY with BIOPSY and EXCISION VOLLECULAR CYST;  Surgeon: Ruby Cola, MD;  Location: [facility];  Service: ENT;  Laterality: N/A;  . REFRACTIVE SURGERY Bilateral    both eyes  . SPINE SURGERY     lumbar    Medications: Reviewed  Allergies:  Allergies  Allergen Reactions  . Morphine And Related Other (See Comments)    Hallucenations   . Penicillins Rash    Swelling    Family history: Mother deceased at age 27 with "hip and brain cancer". Brother deceased age 82 with "liver cancer". Brother age 37 with throat cancer. Brother age 66 has esophagus cancer.  Social History:   She lives in [place]. She is separated. She has a daughter who is followed by Dr. Beryle Beams for iron deficiency related to gastric surgery. She receives periodic iron infusions. Julie Brewer is retired from [employer] work. She smokes 1 pack a day for the past 50 years. She reports occasional alcohol intake (vodka), maybe one-time per week.  ROS:  She is blind. As a result she is unsure if she is having any bleeding. Her appetite is stable. Weight is stable. Energy level is poor. No fevers or sweats. No hearing dysfunction. No mouth sores. Stable shortness of breath with exertion. No chest pain. No nausea or vomiting. No dysphagia. She has noticed intermittent constipation over the past year. No leg swelling. No dysuria. She reports  eating a regular diet. She reports having peripheral neuropathy.  Physical Exam:  Blood pressure (!) 136/57, pulse (!) 102, temperature 98.2 F (36.8 C), temperature source Oral, resp. rate 19, weight 144 lb 11.2 oz (65.6 kg), SpO2 99 %.  HEENT: White coating over tongue. Lungs: Lungs clear bilaterally. Cardiac: Regular rate and rhythm. Abdomen: Abdomen soft and nontender. No organomegaly. No mass. Vascular: No leg edema. Lymph nodes: No palpable cervical, supra clavicular, axillary or inguinal lymph nodes. Neurologic: Alert and oriented. Rectal: No rectal mass. Stool  brown, guaiac negative.    LAB:  CBC  Lab Results  Component Value Date   WBC 10.4 (H) 03/09/2016   HGB 11.3 (L) 03/09/2016   HCT 34.9 03/09/2016   MCV 79.7 03/09/2016   PLT 263 03/09/2016   NEUTROABS 7.3 (H) 03/09/2016     CMP      Component Value Date/Time   NA 135 01/27/2016 1107   K 4.7 01/27/2016 1107   CL 107 01/27/2016 1107   CO2 22 01/27/2016 1107   GLUCOSE 156 (H) 01/27/2016 1107   BUN 23 01/27/2016 1107   CREATININE 1.58 (H) 01/27/2016 1107   CALCIUM 9.4 01/27/2016 1107   PROT 6.5 09/13/2015 1010   ALBUMIN 3.6 09/13/2015 1010   AST 13 09/13/2015 1010   ALT 9 09/13/2015 1010   ALKPHOS 65 09/13/2015 1010   BILITOT 0.2 09/13/2015 1010   GFRNONAA >60 06/04/2014 1152   GFRAA >60 06/04/2014 1152   Review of peripheral blood smear: (1) normal platelets; (2) normal white blood cells; (3) red blood cells-moderate variation in red blood cell size; a few teardrops, targets, ovalocytes; a few helmets; ? few cells with dense hemoglobin "C" cells   Assessment/Plan:   1. Mild anemia  Low ferritin 01/09/2016 and 02/28/2016  Negative stool Hemoccult 03/09/2016 (she is unable to complete stool cards due to blindness) 2. Chronic red cell microcytosis 3. Chronic renal failure 4. Hypertension 5. Diabetes 6. Glaucoma with blindness 7. Peripheral neuropathy 8. 07/09/2012 colonoscopy-2 sessile polyps found in the rectum, removed (hyperplastic polyp, no adenomatous change or malignancy)   Disposition:   Julie Brewer has a chronic red cell microcytosis, more recent mild anemia and documented low ferritin. She may have a hemoglobinopathy to explain the chronic red cell microcytosis. There is no obvious source of bleeding by history or physical exam to explain the iron deficiency. The mild anemia may be related to a combination of iron deficiency and chronic renal failure.  We will obtain a hemoglobin evaluation to evaluate for a hemoglobinopathy. We are referring her  to Dr. Amedeo Plenty for GI evaluation to follow-up on the iron deficiency. We will obtain a urinalysis. She will continue oral iron as she is currently taking.  She will return for a follow-up visit in approximately 2 months.    Patient seen with Dr. Benay Spice. 45 minutes were spent face-to-face at today's visit with the majority of that time involved in counseling/coordination of care  Ned Card, ANP/GNP-BC 03/09/2016, 3:12 PM   This was a shared visit with Ned Card.  Julie Brewer was interviewed and examined. I reviewed the blood smear. She has iron deficiency and chronic red cell microcytosis.  She may have a hemoglobinopathy.  [name], MD

## 2016-03-09 NOTE — Progress Notes (Signed)
Called Dr. Amedeo Plenty office for last colonoscopy report. Per [employer] last one was from June 2014, she will fax this over. Fax received.

## 2016-03-09 NOTE — Telephone Encounter (Signed)
Julie Brewer from the cancer center need lab order for patient so she can get labs drawn there. So patient will not have to come here.

## 2016-03-09 NOTE — Telephone Encounter (Signed)
Please advise on which labs. Thank you!

## 2016-03-09 NOTE — Telephone Encounter (Signed)
Cancer center  Called and ask if patient can be drawn  There [phone]  [facility]

## 2016-03-11 NOTE — Telephone Encounter (Signed)
She already has orders for fructosamine and BMP

## 2016-03-12 ENCOUNTER — Telehealth: Payer: Self-pay

## 2016-03-12 ENCOUNTER — Other Ambulatory Visit: Payer: Self-pay | Admitting: *Deleted

## 2016-03-12 ENCOUNTER — Other Ambulatory Visit: Payer: Self-pay

## 2016-03-12 DIAGNOSIS — Z794 Long term (current) use of insulin: Principal | ICD-10-CM

## 2016-03-12 DIAGNOSIS — E1165 Type 2 diabetes mellitus with hyperglycemia: Principal | ICD-10-CM

## 2016-03-12 DIAGNOSIS — IMO0001 Reserved for inherently not codable concepts without codable children: Secondary | ICD-10-CM

## 2016-03-12 LAB — HEMOGLOBINOPATHY EVALUATION
HGB C: 0 %
HGB S: 0 %
HGB VARIANT: 0 %
Hemoglobin A2 Quantitation: 2.1 % (ref 1.8–3.2)
Hemoglobin F Quantitation: 0 % (ref 0.0–2.0)
Hgb A: 97.9 % (ref 96.4–98.8)

## 2016-03-12 NOTE — Telephone Encounter (Signed)
Please advise. Patient was not able to have the two lab orders drawn at her appointment with the her oncology doctor on Friday. Patient has appointment with you on Thursday, would you still be okay seeing her without those labs. Patient can not come any day sooner to get those labs drawn. Thank you!

## 2016-03-12 NOTE — Telephone Encounter (Signed)
Please disregard last message. The cancer center did draw these labs you needed, they just needed the fax orders. I faxed them today, and will hopefully have those results soon. Thank you!

## 2016-03-12 NOTE — Telephone Encounter (Signed)
Called, submitted questions to Dr.Kumar. Awaiting response.

## 2016-03-15 ENCOUNTER — Ambulatory Visit (INDEPENDENT_AMBULATORY_CARE_PROVIDER_SITE_OTHER): Payer: Medicare HMO | Admitting: Endocrinology

## 2016-03-15 ENCOUNTER — Encounter: Payer: Self-pay | Admitting: Endocrinology

## 2016-03-15 VITALS — BP 140/64 | HR 94 | Ht 63.0 in | Wt 147.0 lb

## 2016-03-15 DIAGNOSIS — E1165 Type 2 diabetes mellitus with hyperglycemia: Secondary | ICD-10-CM

## 2016-03-15 DIAGNOSIS — Z794 Long term (current) use of insulin: Secondary | ICD-10-CM

## 2016-03-15 NOTE — Progress Notes (Signed)
Patient ID: Julie Brewer, female   DOB: Jan 29, 1951, 66 y.o.   MRN: [id number]   Reason for Appointment: Follow-up  History of Present Illness   Diagnosis: Type 2 DIABETES MELITUS, date of diagnosis:  1985  Prior history: She has been on insulin since diagnosis and on Lantus previously Also at some point had been started on Glucophage several years ago also Because of insurance preference Lantus was changed to Levemir  She refuses to use analog rapid acting insulin because of cost and is using regular insulin for several years   Her blood sugars are generally well controlled and A1c usually under 7%  Recent history:   Insulin regimen: Levemir insulin 20 in the morning once daily. Regular insulin 30 minutes Before eating, 10 units a.m. and 5-7 ac supper  Oral hypoglycemic drugs: Metformin ER 750 at supper   Her A1c was higher with stopping metformin at 8% and this was restarted on her last visit  Current blood sugar patterns, management and problems:  She is unable to check her blood sugar again as she does not have any help at home  Her fructosamine is excellent at 233 with getting her back on metformin.  Lab glucose was 171 after breakfast last week  She was told to take 2 more units for breakfast on her regular insulin because of tendency to high readings after morning meal but not clear if this is working consistently  No side effects with metformin and her renal function is stable  She frequently does not have symptoms with hypoglycemia  Side effects from medications: None      Monitors blood glucose: Not at all  Glucometer:  FreeStyle     Meals:  usually 2 meals per day at 10 AM and 5 PM.    Mealtime protein sources:turkey, chicken.  Eating cereal for breakfast Avoiding sweet drinks Physical activity: exercise: Just walking within the house              Microalbumin has been mildly increased   Wt Readings from Last 3 Encounters:  03/15/16 147 lb  (66.7 kg)  03/09/16 144 lb 11.2 oz (65.6 kg)  02/02/16 147 lb 12.8 oz (67 kg)   Lab Results  Component Value Date   HGBA1C 8.0 (H) 01/27/2016   HGBA1C 6.0 09/13/2015   HGBA1C 6.4 06/14/2015   Lab Results  Component Value Date   MICROALBUR 43.7 (H) 01/27/2016   LDLCALC 49 09/13/2015   CREATININE 1.58 (H) 01/27/2016      No results found for: FRUCTOSAMINE     Orders Only on 03/09/2016  Component Date Value Ref Range Status  . HEMOGLOBIN F QUANTITATION 03/09/2016 0.0  0.0 - 2.0 % Final  . Hgb A 03/09/2016 97.9  96.4 - 98.8 % Final  . HGB S 03/09/2016 0.0  0.0 % Final  . HGB C 03/09/2016 0.0  0.0 % Final  . HEMOGLOBIN A2 QUANTITATION 03/09/2016 2.1  1.8 - 3.2 % Final  . HGB VARIANT 03/09/2016 0.0  0.0 % Final  . HGB INTERPRETATION 03/09/2016 Comment   Final  Appointment on 03/09/2016  Component Date Value Ref Range Status  . WBC 03/09/2016 10.4* 3.9 - 10.3 10e3/uL Final  . NEUT# 03/09/2016 7.3* 1.5 - 6.5 10e3/uL Final  . HGB 03/09/2016 11.3* 11.6 - 15.9 g/dL Final  . HCT 03/09/2016 34.9  34.8 - 46.6 % Final  . Platelets 03/09/2016 263  145 - 400 10e3/uL Final  . MCV 03/09/2016 79.7  79.5 -  101.0 fL Final  . MCH 03/09/2016 25.8  25.1 - 34.0 pg Final  . MCHC 03/09/2016 32.4  31.5 - 36.0 g/dL Final  . RBC 03/09/2016 4.38  3.70 - 5.45 10e6/uL Final  . RDW 03/09/2016 17.1* 11.2 - 14.5 % Final  . lymph# 03/09/2016 1.2  0.9 - 3.3 10e3/uL Final  . MONO# 03/09/2016 0.9  0.1 - 0.9 10e3/uL Final  . Eosinophils Absolute 03/09/2016 0.9* 0.0 - 0.5 10e3/uL Final  . Basophils Absolute 03/09/2016 0.0  0.0 - 0.1 10e3/uL Final  . NEUT% 03/09/2016 70.2  38.4 - 76.8 % Final  . LYMPH% 03/09/2016 11.9* 14.0 - 49.7 % Final  . MONO% 03/09/2016 8.7  0.0 - 14.0 % Final  . EOS% 03/09/2016 8.8* 0.0 - 7.0 % Final  . BASO% 03/09/2016 0.4  0.0 - 2.0 % Final  . Extra Tube 03/09/2016 Sample held in lab for 7 days   Final  . Smear Result 03/09/2016 Smear Available   Final  . Ferritin 03/09/2016 104  9 -  269 ng/ml Final  . Glucose 03/09/2016 Negative  Negative mg/dL Final  . Bilirubin (Urine) 03/09/2016 Negative  Negative Final  . Ketones 03/09/2016 Negative  Negative mg/dL Final  . Specific Gravity, Urine 03/09/2016 1.010  1.003 - 1.035 Final  . Blood 03/09/2016 Trace  Negative Final  . pH 03/09/2016 6.0  4.6 - 8.0 Final  . Protein 03/09/2016 30  Negative- <30 mg/dL Final  . Urobilinogen, UR 03/09/2016 0.2  0.2 - 1 mg/dL Final  . Nitrite 03/09/2016 Negative  Negative Final  . Leukocyte Esterase 03/09/2016 Negative  Negative Final  . RBC / HPF 03/09/2016 0-2  0 - 2 Final  . WBC, UA 03/09/2016 0-2  0-2;Negative Final  . Epithelial Cells 03/09/2016 Few  Negative- Few Final    Allergies as of 03/15/2016      Reactions   Morphine And Related Other (See Comments)   Hallucenations    Penicillins Rash   Swelling      Medication List       Accurate as of 03/15/16 11:07 AM. Always use your most recent med list.          amLODipine 10 MG tablet Commonly known as:  NORVASC Take 10 mg by mouth daily.   aspirin 325 MG EC tablet Take 325 mg by mouth daily.   atorvastatin 20 MG tablet Commonly known as:  LIPITOR Take 1 tablet (20 mg total) by mouth daily.   cilostazol 100 MG tablet Commonly known as:  PLETAL Take 100 mg by mouth 2 (two) times daily.   gabapentin 100 MG capsule Commonly known as:  NEURONTIN TAKE ONE CAPSULE BY MOUTH TWICE DAILY   glucose blood test strip Commonly known as:  FREESTYLE LITE Use as instructed to check blood sugar 2 times a day dx code E11.9   hydrochlorothiazide 12.5 MG capsule Commonly known as:  MICROZIDE TAKE ONE CAPSULE BY MOUTH DAILY   insulin regular 250 units/2.54mL (100 units/mL) injection Commonly known as:  NOVOLIN R,HUMULIN R Inject into the skin 2 (two) times daily before a meal. 10 units in the morning and 5-7 units in the evening.   IRON PO Take by mouth.   LEVEMIR 100 UNIT/ML injection Generic drug:  insulin detemir INJECT  56 UNITS INTO THE SKIN IN THE MORNING AND 30 UNITS INTO THE SKIN IN THE EVENING.   lisinopril 40 MG tablet Commonly known as:  PRINIVIL,ZESTRIL Take 40 mg by mouth daily.   metFORMIN 750  MG 24 hr tablet Commonly known as:  GLUCOPHAGE-XR TAKE ONE TABLET BY MOUTH ONCE DAILY   omeprazole 20 MG capsule Commonly known as:  PRILOSEC TAKE ONE CAPSULE BY MOUTH ONCE DAILY (DUE FOR OFFICE VISIT THIS MONTH).   Vitamin D (Ergocalciferol) 50000 units Caps capsule Commonly known as:  DRISDOL TAKE ONE CAPSULE BY MOUTH EVERY 7 DAYS       Allergies:  Allergies  Allergen Reactions  . Morphine And Related Other (See Comments)    Hallucenations   . Penicillins Rash    Swelling    Past Medical History:  Diagnosis Date  . Asthma    No probnlems recently  . 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 Diabetes mellitus    Type II per Dr [name]  (patient said type I)  . GERD (gastroesophageal reflux disease)   . Glaucoma   . Hyperlipidemia   . Hypertension   . Iron deficiency anemia 03/09/2016  . Peripheral vascular disease ([place])   . Pneumonia 2006  . Shortness of breath dyspnea    with exdrtion, "Walkling too fast"  . Stroke [facility])    no residual    Past Surgical History:  Procedure Laterality Date  . ABDOMINAL HYSTERECTOMY    . CERVICAL FUSION     with graft from hip  . COLONOSCOPY  July 09, 2012  . DIRECT LARYNGOSCOPY N/A 06/07/2014   Procedure: DIRECT LARYNGOSCOPY with BIOPSY and EXCISION VOLLECULAR CYST;  Surgeon: Ruby Cola, MD;  Location: [place];  Service: ENT;  Laterality: N/A;  . REFRACTIVE SURGERY Bilateral    both eyes  . SPINE SURGERY     lumbar    Family History  Problem Relation Age of Onset  . Cancer Mother   . Heart disease Mother   . Diabetes Father   . Cancer Brother   . Cancer Brother     Social History:  reports that she has been smoking Cigarettes.  She has a 30.00 pack-year smoking history. She has never  used smokeless tobacco. She reports that she does not drink alcohol or use drugs.  Review of Systems:   Blindness: She has absent vision on the right side and can see silhouettes only on the left.    HYPERTENSION:   taking amlodipine10 mg, HCTZ 12.5 mg and lisinopril 40 mg dose.    Renal function: Her creatinine is Still relatively high, has been now followed by nephrologist   Lab Results  Component Value Date   CREATININE 1.58 (H) 01/27/2016   BUN 23 01/27/2016   NA 135 01/27/2016   K 4.7 01/27/2016   CL 107 01/27/2016   CO2 22 01/27/2016     HYPERLIPIDEMIA: The lipid abnormality consists of elevated LDL controlled with Lipitor 20 mg .  Her baseline LDL was 202  Lab Results  Component Value Date   CHOL 156 09/13/2015   HDL 82.20 09/13/2015   LDLCALC 49 09/13/2015   TRIG 125.0 09/13/2015   CHOLHDL 2 09/13/2015    Vitamin D: Taking 50,000 unit dosage once a month, Unclear if this is followed by PCP  Lab Results  Component Value Date   VD25OH 28.68 (L) 01/27/2016       Last diabetic foot exam was done in 08/2015   Examination:   BP 140/64   Pulse 94   Ht 5\' 3"  (1.6 m)   Wt 147 lb (66.7 kg)   SpO2 97%   BMI  26.04 kg/m   Body mass index is 26.04 kg/m.    ASSESSMENT/ PLAN:   Diabetes type 2  See history of present illness for detailed discussion of current management, blood sugar patterns and problems identified  Her blood sugars are now Looking better as judged by her fructosamine of 233 She has responded to adding back the metformin and her renal function is adequate for the lower dose Postprandial reading after breakfast was 171 which appears adequate Again have difficulty knowing what her blood sugars are since she cannot monitor at home She will continue the same regimen including insulin which she is compliant with   HYPERTENSION:   controlled  Renal dysfunction: She will continue follow-up with nephrologist    There are no Patient  Instructions on file for this visit.  Matthew Cina 03/15/2016, 11:07 AM

## 2016-03-16 ENCOUNTER — Other Ambulatory Visit: Payer: Self-pay | Admitting: Nephrology

## 2016-03-16 DIAGNOSIS — N183 Chronic kidney disease, stage 3 unspecified: Secondary | ICD-10-CM

## 2016-03-19 ENCOUNTER — Telehealth: Payer: Self-pay | Admitting: Oncology

## 2016-03-19 NOTE — Telephone Encounter (Signed)
Spoke with patient re lab/fu 4/20 and Dr. Amedeo Plenty at [facility] 03/27/16 @ 2:45 pm. Appointments mailed. Spoke with Chalmers Cater at [place].

## 2016-03-21 ENCOUNTER — Other Ambulatory Visit: Payer: Medicare HMO

## 2016-03-27 DIAGNOSIS — R1314 Dysphagia, pharyngoesophageal phase: Secondary | ICD-10-CM | POA: Diagnosis not present

## 2016-03-27 DIAGNOSIS — D509 Iron deficiency anemia, unspecified: Secondary | ICD-10-CM | POA: Diagnosis not present

## 2016-03-27 DIAGNOSIS — R1310 Dysphagia, unspecified: Secondary | ICD-10-CM | POA: Diagnosis not present

## 2016-04-04 DIAGNOSIS — K449 Diaphragmatic hernia without obstruction or gangrene: Secondary | ICD-10-CM | POA: Diagnosis not present

## 2016-04-04 DIAGNOSIS — R1310 Dysphagia, unspecified: Secondary | ICD-10-CM | POA: Diagnosis not present

## 2016-04-12 DIAGNOSIS — D485 Neoplasm of uncertain behavior of skin: Secondary | ICD-10-CM | POA: Diagnosis not present

## 2016-04-18 DIAGNOSIS — R55 Syncope and collapse: Secondary | ICD-10-CM | POA: Diagnosis not present

## 2016-04-30 ENCOUNTER — Telehealth: Payer: Self-pay | Admitting: Cardiology

## 2016-04-30 NOTE — Telephone Encounter (Signed)
Received records from [place] for appointment on 05/01/16 with Kerin Ransom, [place].  Records put with Luke's schedule for 05/01/16. lp

## 2016-05-01 ENCOUNTER — Ambulatory Visit (INDEPENDENT_AMBULATORY_CARE_PROVIDER_SITE_OTHER): Payer: Medicare HMO | Admitting: Cardiology

## 2016-05-01 ENCOUNTER — Encounter: Payer: Self-pay | Admitting: Cardiology

## 2016-05-01 ENCOUNTER — Ambulatory Visit: Payer: Medicare HMO | Admitting: Podiatry

## 2016-05-01 VITALS — BP 140/70 | HR 94 | Ht 63.0 in | Wt 148.8 lb

## 2016-05-01 DIAGNOSIS — N183 Chronic kidney disease, stage 3 unspecified: Secondary | ICD-10-CM | POA: Insufficient documentation

## 2016-05-01 DIAGNOSIS — H547 Unspecified visual loss: Secondary | ICD-10-CM | POA: Diagnosis not present

## 2016-05-01 DIAGNOSIS — I739 Peripheral vascular disease, unspecified: Secondary | ICD-10-CM | POA: Diagnosis not present

## 2016-05-01 DIAGNOSIS — Z794 Long term (current) use of insulin: Secondary | ICD-10-CM

## 2016-05-01 DIAGNOSIS — IMO0001 Reserved for inherently not codable concepts without codable children: Secondary | ICD-10-CM

## 2016-05-01 DIAGNOSIS — R55 Syncope and collapse: Secondary | ICD-10-CM

## 2016-05-01 DIAGNOSIS — E785 Hyperlipidemia, unspecified: Secondary | ICD-10-CM | POA: Diagnosis not present

## 2016-05-01 DIAGNOSIS — Z8673 Personal history of transient ischemic attack (TIA), and cerebral infarction without residual deficits: Secondary | ICD-10-CM | POA: Insufficient documentation

## 2016-05-01 DIAGNOSIS — I10 Essential (primary) hypertension: Secondary | ICD-10-CM

## 2016-05-01 DIAGNOSIS — E118 Type 2 diabetes mellitus with unspecified complications: Secondary | ICD-10-CM | POA: Diagnosis not present

## 2016-05-01 NOTE — Assessment & Plan Note (Signed)
2010-MRI at [facility] radiology March 2011 but I was unable to access the results.

## 2016-05-01 NOTE — Assessment & Plan Note (Signed)
Secondary to glaucoma

## 2016-05-01 NOTE — Assessment & Plan Note (Signed)
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

## 2016-05-01 NOTE — Progress Notes (Signed)
05/01/2016 Julie Brewer   [birth year]  [id number]  Primary Physician WEBB, Valla Leaver, MD Primary Cardiologist: Dr Gwenlyn Found  HPI:  Pleasant 66 y/o (looks older) AA female with a history of IDDM since 1985, HTN, and  PVD with moderate Lt ABI abnormality (dopplers 2016). She saw Dr Gwenlyn Found in Dec 2017 after an episode of syncope. The pt was noted to be unresponsive while sitting in a chair. No work up done then. She is in the office today after she says she had another episode of syncope a couple of weeks ago. The pt had walked upstair to go into the bathroom and collapsed. The episode was unwitnessed. Her niece heard her fall.  She had no warning. She denies any significant injury. She did not seek medical attention. She has not had recurrent syncope since then. She denies any palpitations or chest pain. She says she is always SOB, nothing new.   Current Outpatient Prescriptions  Medication Sig Dispense Refill  . amLODipine (NORVASC) 10 MG tablet Take 10 mg by mouth daily.     Marland Kitchen aspirin 325 MG EC tablet Take 325 mg by mouth daily.    Marland Kitchen atorvastatin (LIPITOR) 20 MG tablet Take 1 tablet (20 mg total) by mouth daily. 90 tablet 3  . cilostazol (PLETAL) 100 MG tablet Take 100 mg by mouth 2 (two) times daily.    Marland Kitchen gabapentin (NEURONTIN) 100 MG capsule TAKE ONE CAPSULE BY MOUTH TWICE DAILY 180 capsule 1  . glucose blood (FREESTYLE LITE) test strip Use as instructed to check blood sugar 2 times a day dx code E11.9 200 each 1  . hydrochlorothiazide (MICROZIDE) 12.5 MG capsule TAKE ONE CAPSULE BY MOUTH DAILY 90 capsule 1  . insulin detemir (LEVEMIR) 100 UNIT/ML injection Inject 20 Units into the skin every morning.    . insulin regular (NOVOLIN R,HUMULIN R) 100 units/mL injection Inject into the skin 2 (two) times daily before a meal. 10 units in the morning and 5-7 units in the evening.    . IRON PO Take 1 tablet by mouth daily.     Marland Kitchen lisinopril (PRINIVIL,ZESTRIL) 40 MG tablet Take 40 mg by mouth  daily.     . metFORMIN (GLUCOPHAGE-XR) 750 MG 24 hr tablet TAKE ONE TABLET BY MOUTH ONCE DAILY 90 tablet 0  . omeprazole (PRILOSEC) 20 MG capsule TAKE ONE CAPSULE BY MOUTH ONCE DAILY (DUE FOR OFFICE VISIT THIS MONTH). 90 capsule 1  . Vitamin D, Ergocalciferol, (DRISDOL) 50000 units CAPS capsule TAKE ONE CAPSULE BY MOUTH EVERY 7 DAYS (Patient taking differently: TAKE ONE CAPSULE BY MOUTH  ONCE  A MONTH) 12 capsule 0   No current facility-administered medications for this visit.     Allergies  Allergen Reactions  . Morphine And Related Other (See Comments)    Hallucenations   . Penicillins Rash    Swelling    Past Medical History:  Diagnosis Date  . Asthma    No probnlems recently  . 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 Diabetes mellitus    Type II per Dr [name]  (patient said type I)  . GERD (gastroesophageal reflux disease)   . Glaucoma   . Hyperlipidemia   . Hypertension   . Iron deficiency anemia 03/09/2016  . Peripheral vascular disease ([place])   . Pneumonia 2006  . Shortness of breath dyspnea    with exdrtion, "Walkling too fast"  . Stroke [facility])  no residual    Social History   Social History  . Marital status: Legally Separated    Spouse name: N/A  . Number of children: N/A  . Years of education: N/A   Occupational History  . Not on file.   Social History Main Topics  . Smoking status: Current Every Day Smoker    Packs/day: 1.00    Years: 30.00    Types: Cigarettes  . Smokeless tobacco: Never Used  . Alcohol use No  . Drug use: No  . Sexual activity: Not on file   Other Topics Concern  . Not on file   Social History Narrative  . No narrative on file     Family History  Problem Relation Age of Onset  . Cancer Mother   . Heart disease Mother   . Diabetes Father   . Cancer Brother   . Cancer Brother      Review of Systems: General: negative for chills, fever, night sweats or weight changes.    Cardiovascular: negative for chest pain, dyspnea on exertion, edema, orthopnea, palpitations, paroxysmal nocturnal dyspnea or shortness of breath Dermatological: negative for rash Respiratory: negative for cough or wheezing Urologic: negative for hematuria Abdominal: negative for nausea, vomiting, diarrhea, bright red blood per rectum, melena, or hematemesis Neurologic: negative for visual changes, or dizziness All other systems reviewed and are otherwise negative except as noted above.    Blood pressure 140/70, pulse 94, height 5\' 3"  (1.6 m), weight 148 lb 12.8 oz (67.5 kg).  General appearance: alert, cooperative, appears older than stated age, no distress and blind, uses a cane Neck: no carotid bruit, no JVD Lungs: clear to auscultation bilaterally Heart: regular rate and rhythm and S4 present Pulses: 2+ and symmetric Skin: Skin color, texture, turgor normal. No rashes or lesions Neurologic: Grossly normal  EKG - pt left before this was done, (our mistake) she was in NSR on exam  ASSESSMENT AND PLAN:   Syncope and collapse Syncope in Dec while sitting in a chair. 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 F/U with Dr Gwenlyn Found after this. I did not think placing a monitor would help with the long latent period between episodes. If her LVF is normal consider Loop recorder  Kerin Ransom PA-C 05/01/2016 1:36 PM

## 2016-05-01 NOTE — Patient Instructions (Addendum)
Medication Instructions: Your physician recommends that you continue on your current medications as directed. Please refer to the Current Medication list given to you today.   Procedures/Testing: Your physician has requested that you have an echocardiogram. Echocardiography is a painless test that uses sound waves to create images of your heart. It provides your doctor with information about the size and shape of your heart and how well your heart's chambers and valves are working. This procedure takes approximately one hour. There are no restrictions for this procedure. This will be done at [address]. The patient will also need an EKG at the time of the echocardiogram.    Follow-Up: Your physician recommends that you schedule a follow-up appointment in 4 weeks with Dr. Gwenlyn Found   If you need a refill on your cardiac medications before your next appointment, please call your pharmacy.

## 2016-05-01 NOTE — Assessment & Plan Note (Signed)
Controlled.  

## 2016-05-01 NOTE — Assessment & Plan Note (Signed)
Syncope in Dec while sitting in a chair. Recurrent episode walking to [facility] March 2018

## 2016-05-01 NOTE — Assessment & Plan Note (Signed)
On low dose statin rx

## 2016-05-01 NOTE — Assessment & Plan Note (Signed)
Moderate decrease in Lt ABI- last dopplers Aug 2016- followed by Dr Trula Slade

## 2016-05-02 ENCOUNTER — Other Ambulatory Visit: Payer: Self-pay | Admitting: Endocrinology

## 2016-05-04 ENCOUNTER — Other Ambulatory Visit: Payer: Self-pay | Admitting: Endocrinology

## 2016-05-04 ENCOUNTER — Other Ambulatory Visit: Payer: Self-pay | Admitting: *Deleted

## 2016-05-04 DIAGNOSIS — I739 Peripheral vascular disease, unspecified: Secondary | ICD-10-CM

## 2016-05-04 DIAGNOSIS — I10 Essential (primary) hypertension: Secondary | ICD-10-CM

## 2016-05-09 ENCOUNTER — Other Ambulatory Visit: Payer: Self-pay | Admitting: Family Medicine

## 2016-05-09 DIAGNOSIS — I10 Essential (primary) hypertension: Secondary | ICD-10-CM | POA: Diagnosis not present

## 2016-05-09 DIAGNOSIS — E785 Hyperlipidemia, unspecified: Secondary | ICD-10-CM | POA: Diagnosis not present

## 2016-05-09 DIAGNOSIS — Z720 Tobacco use: Secondary | ICD-10-CM | POA: Diagnosis not present

## 2016-05-09 DIAGNOSIS — E1151 Type 2 diabetes mellitus with diabetic peripheral angiopathy without gangrene: Secondary | ICD-10-CM | POA: Diagnosis not present

## 2016-05-09 DIAGNOSIS — Z1231 Encounter for screening mammogram for malignant neoplasm of breast: Secondary | ICD-10-CM

## 2016-05-09 DIAGNOSIS — Z0000 Encounter for general adult medical examination without abnormal findings: Secondary | ICD-10-CM | POA: Diagnosis not present

## 2016-05-15 ENCOUNTER — Other Ambulatory Visit: Payer: Self-pay

## 2016-05-15 ENCOUNTER — Ambulatory Visit (INDEPENDENT_AMBULATORY_CARE_PROVIDER_SITE_OTHER): Payer: Medicare HMO | Admitting: *Deleted

## 2016-05-15 ENCOUNTER — Ambulatory Visit (HOSPITAL_COMMUNITY): Payer: Medicare HMO | Attending: Cardiovascular Disease

## 2016-05-15 VITALS — BP 140/76 | HR 91 | Ht 63.0 in | Wt 147.0 lb

## 2016-05-15 DIAGNOSIS — R55 Syncope and collapse: Secondary | ICD-10-CM

## 2016-05-15 DIAGNOSIS — I5030 Unspecified diastolic (congestive) heart failure: Secondary | ICD-10-CM | POA: Insufficient documentation

## 2016-05-15 NOTE — Progress Notes (Signed)
Reason for visit: EKG done today. 1.) Name of MD requesting visit: Dr Gwenlyn Found  2.) H&P: HTN,PVD, Syncope.  3.) ROS related to problem: Syncope and collapse.  4.) Assessment and plan per MD:Pt in the office for an Echo and EKG. Pt was in for an office visit on  05/01/16. Pt left the office before doing the EKG. EKG review  Per Dr Irish Lack MD DOD, SR rate 91 beats/minute. Pt had no complains.

## 2016-05-16 ENCOUNTER — Ambulatory Visit (INDEPENDENT_AMBULATORY_CARE_PROVIDER_SITE_OTHER): Payer: Medicare HMO | Admitting: Podiatry

## 2016-05-16 DIAGNOSIS — Q828 Other specified congenital malformations of skin: Secondary | ICD-10-CM

## 2016-05-16 DIAGNOSIS — M79675 Pain in left toe(s): Secondary | ICD-10-CM | POA: Diagnosis not present

## 2016-05-16 DIAGNOSIS — E1151 Type 2 diabetes mellitus with diabetic peripheral angiopathy without gangrene: Secondary | ICD-10-CM

## 2016-05-16 DIAGNOSIS — B351 Tinea unguium: Secondary | ICD-10-CM

## 2016-05-16 DIAGNOSIS — M79674 Pain in right toe(s): Secondary | ICD-10-CM

## 2016-05-16 DIAGNOSIS — M79676 Pain in unspecified toe(s): Secondary | ICD-10-CM | POA: Diagnosis not present

## 2016-05-16 NOTE — Patient Instructions (Signed)

## 2016-05-16 NOTE — Progress Notes (Signed)
Patient ID: Julie Brewer, female   DOB: Jan 27, 1951, 66 y.o.   MRN: [id number]    Subjective: This patient presents again for a scheduled visit complaining of thickened and elongated toenails which are uncomfortable walking wearing shoes as well as a painful plantar callus on the right foot. She presents for debridement of these nails and painful plantar callus  Objective:  DP pulse right 1/4 and DP left 0/4 bilaterally PT pulses 2/4 right and 0/4 left Capillary reflex within normal limits right delayed left Sensation to 10 g monofilament wire intact 4/5 bilaterally Vibratory sensation reactive bilaterally Ankle reflexes reactive bilaterally Hammertoe second bilaterally HAV left No open skin lesions bilaterally Atrophic skin with absent hair growth bilaterally The toenails are hypertrophic, elongated, discolored, deformed and tender direct palpation 6-10 Plantar nucleated keratoses subsecond and fifth MPJ right  Assessment: 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

## 2016-05-18 ENCOUNTER — Telehealth: Payer: Self-pay | Admitting: Oncology

## 2016-05-18 ENCOUNTER — Ambulatory Visit (HOSPITAL_BASED_OUTPATIENT_CLINIC_OR_DEPARTMENT_OTHER): Payer: Medicare HMO | Admitting: Oncology

## 2016-05-18 ENCOUNTER — Other Ambulatory Visit (HOSPITAL_BASED_OUTPATIENT_CLINIC_OR_DEPARTMENT_OTHER): Payer: Medicare HMO

## 2016-05-18 ENCOUNTER — Encounter: Payer: Self-pay | Admitting: Oncology

## 2016-05-18 VITALS — BP 163/60 | HR 95 | Temp 98.2°F | Resp 18 | Ht 63.0 in | Wt 148.2 lb

## 2016-05-18 DIAGNOSIS — N189 Chronic kidney disease, unspecified: Secondary | ICD-10-CM

## 2016-05-18 DIAGNOSIS — H547 Unspecified visual loss: Secondary | ICD-10-CM

## 2016-05-18 DIAGNOSIS — H409 Unspecified glaucoma: Secondary | ICD-10-CM

## 2016-05-18 DIAGNOSIS — E119 Type 2 diabetes mellitus without complications: Secondary | ICD-10-CM

## 2016-05-18 DIAGNOSIS — G629 Polyneuropathy, unspecified: Secondary | ICD-10-CM | POA: Diagnosis not present

## 2016-05-18 DIAGNOSIS — R718 Other abnormality of red blood cells: Secondary | ICD-10-CM | POA: Diagnosis not present

## 2016-05-18 DIAGNOSIS — D509 Iron deficiency anemia, unspecified: Secondary | ICD-10-CM | POA: Diagnosis not present

## 2016-05-18 DIAGNOSIS — I10 Essential (primary) hypertension: Secondary | ICD-10-CM | POA: Diagnosis not present

## 2016-05-18 LAB — CBC WITH DIFFERENTIAL/PLATELET
BASO%: 0.9 % (ref 0.0–2.0)
BASOS ABS: 0.1 10*3/uL (ref 0.0–0.1)
EOS ABS: 0.3 10*3/uL (ref 0.0–0.5)
EOS%: 3.6 % (ref 0.0–7.0)
HCT: 34.4 % — ABNORMAL LOW (ref 34.8–46.6)
HGB: 11.2 g/dL — ABNORMAL LOW (ref 11.6–15.9)
LYMPH%: 14.7 % (ref 14.0–49.7)
MCH: 26.4 pg (ref 25.1–34.0)
MCHC: 32.6 g/dL (ref 31.5–36.0)
MCV: 80.8 fL (ref 79.5–101.0)
MONO#: 0.7 10*3/uL (ref 0.1–0.9)
MONO%: 7.8 % (ref 0.0–14.0)
NEUT#: 6.2 10*3/uL (ref 1.5–6.5)
NEUT%: 73 % (ref 38.4–76.8)
Platelets: 276 10*3/uL (ref 145–400)
RBC: 4.26 10*6/uL (ref 3.70–5.45)
RDW: 16.8 % — ABNORMAL HIGH (ref 11.2–14.5)
WBC: 8.4 10*3/uL (ref 3.9–10.3)
lymph#: 1.2 10*3/uL (ref 0.9–3.3)

## 2016-05-18 LAB — FERRITIN: FERRITIN: 86 ng/mL (ref 9–269)

## 2016-05-18 NOTE — Progress Notes (Signed)
  [place] OFFICE PROGRESS NOTE   Diagnosis: Anemia  INTERVAL HISTORY:   Julie Brewer returns as scheduled. She reports undergoing a negative upper endoscopy by Dr. Amedeo Plenty. She denies bleeding. She is taking iron. No complaint.  Objective:  Vital signs in last 24 hours:  Blood pressure (!) 163/60, pulse 95, temperature 98.2 F (36.8 C), temperature source Oral, resp. rate 18, height 5\' 3"  (1.6 m), weight 148 lb 3.2 oz (67.2 kg), SpO2 100 %. Resp: Lungs clear bilaterally Cardio: Regular rate and rhythm GI: No hepatosplenomegaly Vascular: No leg edema   Lab Results:  Lab Results  Component Value Date   WBC 8.4 05/18/2016   HGB 11.2 (L) 05/18/2016   HCT 34.4 (L) 05/18/2016   MCV 80.8 05/18/2016   PLT 276 05/18/2016   NEUTROABS 6.2 05/18/2016  Ferritin-86 Hemoglobin electrophoresis. 03/09/2016: Hemoglobin A  97.9, hemoglobin A2-2.1  Medications: I have reviewed the patient's current medications.  Assessment/Plan:  1. Mild anemia ? Low ferritin 01/09/2016 and 02/28/2016 ? Negative stool Hemoccult 03/09/2016 (she is unable to complete stool cards due to blindness) 2. Chronic red cell microcytosis 3. Chronic renal failure 4. Hypertension 5. Diabetes 6. Glaucoma with blindness 7. Peripheral neuropathy 8. 07/09/2012 colonoscopy-2 sessile polyps found in the rectum, removed (hyperplastic polyp, no adenomatous change or malignancy)   Disposition:  She has stable mild microcytic anemia. The ferritin level was normal in February and again today. A hemoglobin electrophoresis in February was unremarkable.  The MCV has been low on multiple occasions in the available medical record, but has been normal several times. This argues against a diagnosis of thalassemia, but I suspect she may have an alpha thalassemia variant. Her daughter has chronic iron deficiency and reports a history of thalassemia trait.  She will continue iron once daily. We will follow-up  on the endoscopy by Dr. Amedeo Plenty. We will submit alpha globin gene testing.  She will return for an office visit and CBC in approximately 5 months.  Betsy Coder, MD  05/19/2016  10:15 AM

## 2016-05-18 NOTE — Telephone Encounter (Signed)
Gave patient AVS and calender per 4/20 los.  

## 2016-05-22 ENCOUNTER — Encounter: Payer: Self-pay | Admitting: *Deleted

## 2016-05-23 DIAGNOSIS — E1129 Type 2 diabetes mellitus with other diabetic kidney complication: Secondary | ICD-10-CM | POA: Diagnosis not present

## 2016-05-23 DIAGNOSIS — N183 Chronic kidney disease, stage 3 (moderate): Secondary | ICD-10-CM | POA: Diagnosis not present

## 2016-05-25 LAB — ALPHA-THALASSEMIA GENOTYPR

## 2016-05-29 ENCOUNTER — Ambulatory Visit
Admission: RE | Admit: 2016-05-29 | Discharge: 2016-05-29 | Disposition: A | Discharge status: Home / Self Care | Payer: Medicare HMO | Source: Ambulatory Visit | Attending: Family Medicine | Admitting: Family Medicine

## 2016-05-29 DIAGNOSIS — Z1231 Encounter for screening mammogram for malignant neoplasm of breast: Secondary | ICD-10-CM

## 2016-05-31 ENCOUNTER — Other Ambulatory Visit: Payer: Self-pay | Admitting: Endocrinology

## 2016-06-04 DIAGNOSIS — L309 Dermatitis, unspecified: Secondary | ICD-10-CM | POA: Diagnosis not present

## 2016-06-13 ENCOUNTER — Telehealth: Payer: Self-pay | Admitting: *Deleted

## 2016-06-13 NOTE — Telephone Encounter (Signed)
Called pt with lab results per MD note below. She voiced appreciation for call.

## 2016-06-13 NOTE — Telephone Encounter (Signed)
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

## 2016-06-15 ENCOUNTER — Encounter: Payer: Self-pay | Admitting: Cardiovascular Disease

## 2016-06-15 ENCOUNTER — Ambulatory Visit (INDEPENDENT_AMBULATORY_CARE_PROVIDER_SITE_OTHER): Payer: Medicare HMO | Admitting: Cardiovascular Disease

## 2016-06-15 ENCOUNTER — Encounter (INDEPENDENT_AMBULATORY_CARE_PROVIDER_SITE_OTHER): Payer: Self-pay

## 2016-06-15 ENCOUNTER — Other Ambulatory Visit (INDEPENDENT_AMBULATORY_CARE_PROVIDER_SITE_OTHER): Payer: Medicare HMO

## 2016-06-15 DIAGNOSIS — E1165 Type 2 diabetes mellitus with hyperglycemia: Secondary | ICD-10-CM

## 2016-06-15 DIAGNOSIS — R55 Syncope and collapse: Secondary | ICD-10-CM

## 2016-06-15 DIAGNOSIS — E785 Hyperlipidemia, unspecified: Secondary | ICD-10-CM | POA: Diagnosis not present

## 2016-06-15 DIAGNOSIS — Z794 Long term (current) use of insulin: Secondary | ICD-10-CM | POA: Diagnosis not present

## 2016-06-15 DIAGNOSIS — I10 Essential (primary) hypertension: Secondary | ICD-10-CM

## 2016-06-15 LAB — COMPREHENSIVE METABOLIC PANEL
ALK PHOS: 85 U/L (ref 39–117)
ALT: 13 U/L (ref 0–35)
AST: 15 U/L (ref 0–37)
Albumin: 4 g/dL (ref 3.5–5.2)
BILIRUBIN TOTAL: 0.2 mg/dL (ref 0.2–1.2)
BUN: 25 mg/dL — AB (ref 6–23)
CO2: 22 mEq/L (ref 19–32)
CREATININE: 1.64 mg/dL — AB (ref 0.40–1.20)
Calcium: 9.8 mg/dL (ref 8.4–10.5)
Chloride: 105 mEq/L (ref 96–112)
GFR: 40.6 mL/min — AB (ref >60.00)
GLUCOSE: 114 mg/dL — AB (ref 70–99)
Potassium: 5.1 mEq/L (ref 3.5–5.1)
SODIUM: 131 meq/L — AB (ref 135–145)
TOTAL PROTEIN: 7 g/dL (ref 6.0–8.3)

## 2016-06-15 LAB — BASIC METABOLIC PANEL
BUN: 25 mg/dL — AB (ref 6–23)
CHLORIDE: 105 meq/L (ref 96–112)
CO2: 22 mEq/L (ref 19–32)
Calcium: 9.8 mg/dL (ref 8.4–10.5)
Creatinine, Ser: 1.64 mg/dL — ABNORMAL HIGH (ref 0.40–1.20)
GFR: 40.6 mL/min — ABNORMAL LOW (ref >60.00)
Glucose, Bld: 114 mg/dL — ABNORMAL HIGH (ref 70–99)
POTASSIUM: 5.1 meq/L (ref 3.5–5.1)
Sodium: 131 mEq/L — ABNORMAL LOW (ref 135–145)

## 2016-06-15 LAB — HEMOGLOBIN A1C: HEMOGLOBIN A1C: 7 % — AB (ref 4.6–6.5)

## 2016-06-15 NOTE — Assessment & Plan Note (Signed)
History of hyperlipidemia on statin therapy with lipid profile performed 09/13/15 revealed a total cholesterol 156, LDL 49 and HDL of 82.

## 2016-06-15 NOTE — Assessment & Plan Note (Signed)
Julie Brewer returns today for follow-up of syncope. She had an episode prior to her original offices with me in December, a second episode prior to follow-up visit with Julie Brewer in April of this year and a recent episode several weeks ago while she was sitting at the table having finished dinner. These were all witnessed. At subsequent to the echo was completely normal. She was out for a few seconds this past time and was diaphoretic. The differential diagnosis includes a arrhythmogenic cause, neurologic cause or endocrinologic such as hypoglycemia. She is a diabetic. I'm going to refer her to Dr. Sallyanne Kuster for consideration of loop recorder implantation to rule out a bradycardia arrhythmia.

## 2016-06-15 NOTE — Patient Instructions (Signed)
Medication Instructions: Your physician recommends that you continue on your current medications as directed. Please refer to the Current Medication list given to you today.   Follow-Up: Your physician recommends that you schedule a follow-up appointment with Dr. Sallyanne Kuster for Loop Recorder Implantation evaluation.  Your physician wants you to follow-up in: 6 months with Dr. Gwenlyn Found. You will receive a reminder letter in the mail two months in advance. If you don't receive a letter, please call our office to schedule the follow-up appointment.  If you need a refill on your cardiac medications before your next appointment, please call your pharmacy.

## 2016-06-15 NOTE — Assessment & Plan Note (Signed)
History of essential hypertension blood pressure measured 163/60. She is on amlodipine and lisinopril. Continue current meds at current dosing

## 2016-06-15 NOTE — Progress Notes (Signed)
06/15/2016 Julie Brewer   1950-09-15  [id number]  Primary Physician Julie Small, MD Primary Cardiologist: Julie Harp MD Julie Brewer  HPI:  Julie Brewer is a 66 year old moderately overweight separated African-American female mother of one child, grandmother 70 grandchildren referred by her PCP, Julie Brewer, for evaluation of a recent episode of syncope. She is accompanied by her niece Julie Brewer today. Risk factors include 50-pack-years of tobacco abuse continued to smoke one pack per day. She has history of hypertension, diabetes and hyperlipidemia. She has never had a heart attack but does have a history of "TIAs. She denies chest pain but does get short of breath. She has peripheral arterial disease and is followed by Dr. Trula Brewer for claudication which he is minimally symptomatic from. Approximately one week ago she was witnessed to have lost consciousness while sitting in a chair for 45 minutes. There was no loss of bowel or bladder control or tonic clonic activity. We do not know her serum glucose level at that time. She did have some nausea and vomiting after she woke up. She was seen by Julie Brewer on 4/60/18 which was several weeks after a recurrent episode of syncope and also describes an episode of syncope several weeks ago after eating dinner. Her husband was there. She was noted to be diaphoretic. There are no other symptoms. A 2-D echo was performed 05/15/16 which was entirely normal.   Current Outpatient Prescriptions  Medication Sig Dispense Refill  . amLODipine (NORVASC) 10 MG tablet Take 10 mg by mouth daily.     Marland Kitchen aspirin 325 MG EC tablet Take 325 mg by mouth daily.    Marland Kitchen atorvastatin (LIPITOR) 20 MG tablet TAKE ONE TABLET BY MOUTH DAILY 90 tablet 3  . cilostazol (PLETAL) 100 MG tablet Take 100 mg by mouth 2 (two) times daily.    Marland Kitchen gabapentin (NEURONTIN) 100 MG capsule TAKE ONE CAPSULE BY MOUTH TWICE DAILY 180 capsule 1  . glucose blood (FREESTYLE LITE) test  strip Use as instructed to check blood sugar 2 times a day dx code E11.9 200 each 1  . hydrochlorothiazide (MICROZIDE) 12.5 MG capsule TAKE ONE CAPSULE BY MOUTH DAILY 90 capsule 1  . insulin detemir (LEVEMIR) 100 UNIT/ML injection Inject 20 Units into the skin every morning.    . insulin regular (NOVOLIN R,HUMULIN R) 100 units/mL injection Inject into the skin 2 (two) times daily before a meal. 10 units in the morning and 5-7 units in the evening.    . IRON PO Take 1 tablet by mouth daily.     Marland Kitchen lisinopril (PRINIVIL,ZESTRIL) 40 MG tablet Take 40 mg by mouth daily.     . metFORMIN (GLUCOPHAGE-XR) 750 MG 24 hr tablet TAKE ONE TABLET BY MOUTH ONCE DAILY 90 tablet 0  . omeprazole (PRILOSEC) 20 MG capsule TAKE ONE CAPSULE BY MOUTH ONCE DAILY (DUE FOR OFFICE VISIT THIS MONTH). 90 capsule 1  . Vitamin D, Ergocalciferol, (DRISDOL) 50000 units CAPS capsule TAKE ONE CAPSULE BY MOUTH EVERY 7 DAYS (Patient taking differently: TAKE ONE CAPSULE BY MOUTH  ONCE  A MONTH) 12 capsule 0   No current facility-administered medications for this visit.     Allergies  Allergen Reactions  . Morphine And Related Other (See Comments)    Hallucenations   . Penicillins Rash    Swelling    Social History   Social History  . Marital status: Legally Separated    Spouse name: N/A  . Number of children:  N/A  . Years of education: N/A   Occupational History  . Not on file.   Social History Main Topics  . Smoking status: Current Every Day Smoker    Packs/day: 1.00    Years: 30.00    Types: Cigarettes  . Smokeless tobacco: Never Used  . Alcohol use No  . Drug use: No  . Sexual activity: Not on file   Other Topics Concern  . Not on file   Social History Narrative  . No narrative on file     Review of Systems: General: negative for chills, fever, night sweats or weight changes.  Cardiovascular: negative for chest pain, dyspnea on exertion, edema, orthopnea, palpitations, paroxysmal nocturnal dyspnea or  shortness of breath Dermatological: negative for rash Respiratory: negative for cough or wheezing Urologic: negative for hematuria Abdominal: negative for nausea, vomiting, diarrhea, bright red blood per rectum, melena, or hematemesis Neurologic: negative for visual changes, syncope, or dizziness All other systems reviewed and are otherwise negative except as noted above.    Blood pressure (!) 152/74, pulse 95, height 5\' 3"  (1.6 m), weight 147 lb (66.7 kg), SpO2 99 %.  General appearance: alert and no distress Neck: no adenopathy, no carotid bruit, no JVD, supple, symmetrical, trachea midline and thyroid not enlarged, symmetric, no tenderness/mass/nodules Lungs: clear to auscultation bilaterally Heart: regular rate and rhythm, S1, S2 normal, no murmur, click, rub or gallop Extremities: extremities normal, atraumatic, no cyanosis or edema  EKG not performed today  ASSESSMENT AND PLAN:   Dyslipidemia History of hyperlipidemia on statin therapy with lipid profile performed 09/13/15 revealed a total cholesterol 156, LDL 49 and HDL of 82.  Essential hypertension History of essential hypertension blood pressure measured 163/60. She is on amlodipine and lisinopril. Continue current meds at current dosing  Syncope and collapse [name] returns today for follow-up of syncope. She had an episode prior to her original offices with me in December, a second episode prior to follow-up visit with Julie Brewer in April of this year and a recent episode several weeks ago while she was sitting at the table having finished dinner. These were all witnessed. At subsequent to the echo was completely normal. She was out for a few seconds this past time and was diaphoretic. The differential diagnosis includes a arrhythmogenic cause, neurologic cause or endocrinologic such as hypoglycemia. She is a diabetic. I'm going to refer her to Dr. Sallyanne Brewer for consideration of loop recorder implantation to rule out  a bradycardia arrhythmia.      Julie Harp MD FACP,FACC,FAHA, [facility] 06/15/2016 8:21 AM

## 2016-06-18 ENCOUNTER — Ambulatory Visit (INDEPENDENT_AMBULATORY_CARE_PROVIDER_SITE_OTHER): Payer: Medicare HMO | Admitting: Cardiovascular Disease

## 2016-06-18 ENCOUNTER — Encounter: Payer: Self-pay | Admitting: Cardiovascular Disease

## 2016-06-18 VITALS — BP 130/60 | HR 86 | Ht 63.0 in | Wt 147.0 lb

## 2016-06-18 DIAGNOSIS — R55 Syncope and collapse: Secondary | ICD-10-CM

## 2016-06-18 NOTE — Progress Notes (Signed)
Cardiology Office Note:    Date:  06/18/2016   ID:  Julie Brewer, DOB November 05, 1950, MRN [id number]  PCP:  [name], MD  Cardiologist: Quay Burow, M.D.; [name], MD    Referring MD: [name], MD   Chief Complaint  Patient presents with  . Follow-up    pt has no complaints   Referred to discuss loop recorder implantation for history of syncope  History of Present Illness:    Julie Brewer is a 66 y.o. 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 She has normal left ventricular systolic function, her electrocardiograms showed normal sinus rhythm with lateral ischemic repolarization abnormalities. She is referred for loop recorder implantation. She has had at least 3 episodes of syncope with associated diaphoresis, without angina, shortness of breath or subjective palpitations. Episodes have occurred both while walking in while sitting in a chair. Each episode lasted for only a few seconds and recovery was prompt without postictal confusion, loss of sphincter tone or tonic/clonic activity. She takes amlodipine and lisinopril for blood pressure control.  She denies angina, but has chronic dyspnea. No recent focal neuro complaints. She denies edema or intermittent claudication.  Past Medical History:  Diagnosis Date  . Asthma    No probnlems recently  . 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 Diabetes mellitus    Type II per Dr [name]  (patient said type I)  . GERD (gastroesophageal reflux disease)   . Glaucoma   . Hyperlipidemia   . Hypertension   . Iron deficiency anemia 03/09/2016  . Peripheral vascular disease ([place])   . Pneumonia 2006  . Shortness of breath dyspnea    with exdrtion, "Walkling too fast"  . Stroke [facility])    no residual    Past Surgical  History:  Procedure Laterality Date  . ABDOMINAL HYSTERECTOMY    . CERVICAL FUSION     with graft from hip  . COLONOSCOPY  July 09, 2012  . DIRECT LARYNGOSCOPY N/A 06/07/2014   Procedure: DIRECT LARYNGOSCOPY with BIOPSY and EXCISION VOLLECULAR CYST;  Surgeon: Ruby Cola, MD;  Location: [place];  Service: ENT;  Laterality: N/A;  . REFRACTIVE SURGERY Bilateral    both eyes  . SPINE SURGERY     lumbar    Current Medications: Current Meds  Medication Sig  . amLODipine (NORVASC) 10 MG tablet Take 10 mg by mouth daily.   Marland Kitchen aspirin 325 MG EC tablet Take 325 mg by mouth daily.  Marland Kitchen atorvastatin (LIPITOR) 20 MG tablet TAKE ONE TABLET BY MOUTH DAILY  . cilostazol (PLETAL) 100 MG tablet Take 100 mg by mouth 2 (two) times daily.  Marland Kitchen gabapentin (NEURONTIN) 100 MG capsule TAKE ONE CAPSULE BY MOUTH TWICE DAILY  . glucose blood (FREESTYLE LITE) test strip Use as instructed to check blood sugar 2 times a day dx code E11.9  . hydrochlorothiazide (MICROZIDE) 12.5 MG capsule TAKE ONE CAPSULE BY MOUTH DAILY  . insulin detemir (LEVEMIR) 100 UNIT/ML injection Inject 20 Units into the skin every morning.  . insulin regular (NOVOLIN R,HUMULIN R) 100 units/mL injection Inject into the skin 2 (two) times daily before a meal. 10 units in the morning and 5-7 units in the evening.  . IRON PO Take 1 tablet by mouth daily.   Marland Kitchen lisinopril (PRINIVIL,ZESTRIL) 40 MG tablet Take  40 mg by mouth daily.   . metFORMIN (GLUCOPHAGE-XR) 750 MG 24 hr tablet TAKE ONE TABLET BY MOUTH ONCE DAILY  . omeprazole (PRILOSEC) 20 MG capsule TAKE ONE CAPSULE BY MOUTH ONCE DAILY (DUE FOR OFFICE VISIT THIS MONTH).  . Vitamin D, Ergocalciferol, (DRISDOL) 50000 units CAPS capsule TAKE ONE CAPSULE BY MOUTH EVERY 7 DAYS (Patient taking differently: TAKE ONE CAPSULE BY MOUTH  ONCE  A MONTH)     Allergies:   Morphine and related and Penicillins   Social History   Social History  . Marital status: Legally Separated    Spouse name: N/A  . Number  of children: N/A  . Years of education: N/A   Social History Main Topics  . Smoking status: Current Every Day Smoker    Packs/day: 1.00    Years: 30.00    Types: Cigarettes  . Smokeless tobacco: Never Used  . Alcohol use No  . Drug use: No  . Sexual activity: Not on file   Other Topics Concern  . Not on file   Social History Narrative  . No narrative on file     Family History: The patient's family history includes Cancer in her brother, brother, and mother; Diabetes in her father; Heart disease in her mother. ROS:   Please see the history of present illness.     All other systems reviewed and are negative.  EKGs/Labs/Other Studies Reviewed:     EKG:  EKG is not ordered today.    Recent Labs: 05/18/2016: HGB 11.2; Platelets 276 06/15/2016: ALT 13; BUN 25; BUN 25; Creatinine, Ser 1.64; Creatinine, Ser 1.64; Potassium 5.1; Potassium 5.1; Sodium 131; Sodium 131   Recent Lipid Panel    Component Value Date/Time   CHOL 156 09/13/2015 1010   TRIG 125.0 09/13/2015 1010   HDL 82.20 09/13/2015 1010   CHOLHDL 2 09/13/2015 1010   VLDL 25.0 09/13/2015 1010   LDLCALC 49 09/13/2015 1010    Physical Exam:    VS:  There were no vitals taken for this visit.    Wt Readings from Last 3 Encounters:  06/15/16 147 lb (66.7 kg)  05/18/16 148 lb 3.2 oz (67.2 kg)  05/15/16 147 lb (66.7 kg)     GEN:  Well nourished, well developed in no acute distress HEENT: Normal NECK: No JVD; No carotid bruits LYMPHATICS: No lymphadenopathy CARDIAC: RRR, no murmurs, rubs, gallops RESPIRATORY:  Clear to auscultation without rales, wheezing or rhonchi  ABDOMEN: Soft, non-tender, non-distended MUSCULOSKELETAL:  No edema; No deformity  SKIN: Warm and dry NEUROLOGIC:  Alert and oriented x 3 PSYCHIATRIC:  Normal affect   ASSESSMENT:    No diagnosis found. PLAN:    In order of problems listed above:  1. This patient has had 3 episodes of unexplained syncope occurring over a period of about  6 months. The etiology is unclear and arrhythmic syncope is a possibility. Implantable loop recorder is indicated. This procedure has been fully reviewed with the patient and written informed consent has been obtained.    Medication Adjustments/Labs and Tests Ordered: Current medicines are reviewed at length with the patient today.  Concerns regarding medicines are outlined above. Labs and tests ordered and medication changes are outlined in the patient instructions below:  There are no Patient Instructions on file for this visit.   Signed, [name], MD  06/18/2016 9:00 AM    [facility]

## 2016-06-18 NOTE — Patient Instructions (Signed)
Dr Sallyanne Kuster recommends that you have a loop recorder implanted. This will be performed at [facility] on Wednesday, 06/20/16 at 4:00p. You will need to arrive to the hospital 1 hour early to get prepared for the procedure. Please follow all instructions provided.

## 2016-06-19 ENCOUNTER — Encounter: Payer: Self-pay | Admitting: Endocrinology

## 2016-06-19 ENCOUNTER — Other Ambulatory Visit: Payer: Medicare HMO

## 2016-06-19 ENCOUNTER — Ambulatory Visit (INDEPENDENT_AMBULATORY_CARE_PROVIDER_SITE_OTHER): Payer: Medicare HMO | Admitting: Endocrinology

## 2016-06-19 VITALS — BP 138/68 | HR 106 | Ht 63.0 in | Wt 148.6 lb

## 2016-06-19 DIAGNOSIS — Z794 Long term (current) use of insulin: Secondary | ICD-10-CM

## 2016-06-19 DIAGNOSIS — E1165 Type 2 diabetes mellitus with hyperglycemia: Secondary | ICD-10-CM | POA: Diagnosis not present

## 2016-06-19 DIAGNOSIS — E559 Vitamin D deficiency, unspecified: Secondary | ICD-10-CM | POA: Diagnosis not present

## 2016-06-19 NOTE — Progress Notes (Signed)
Patient ID: Julie Brewer, female   DOB: [birth year], 66 y.o.   MRN: [id number]   Reason for Appointment: Follow-up  History of Present Illness   Diagnosis: Type 2 DIABETES MELITUS, date of diagnosis:  1985  Prior history: She has been on insulin since diagnosis and on Lantus previously Also at some point had been started on Glucophage several years ago also Because of insurance preference Lantus was changed to Levemir  She refuses to use analog rapid acting insulin because of cost and is using regular insulin for several years   Her blood sugars are generally well controlled and A1c usually under 7%  Recent history:   Insulin regimen: Levemir insulin 20 in the morning once daily. Regular insulin 30 minutes Before eating, 10 units a.m. and 5-7 ac supper  Oral hypoglycemic drugs: Metformin ER 750    Her A1c was higher with stopping metformin at 8% and this was restarted in 1/18 This is now down to 7%  Current blood sugar patterns, management and problems:  She is unable to check her blood sugar again as she does not have any help at home  Lab glucose was 114 fasting  She is not aware of symptoms of hypoglycemia and did have better blood sugar checked once when she was passing out by her daughter and it was not low  She is trying to take her insulin as directed 30 minutes before eating and the Levemir in the morning consistently  171 after breakfast last week  She will adjust her evening dose at suppertime based on how much carbohydrate she is getting  Side effects from medications: None      Monitors blood glucose: Not at all  Glucometer:  FreeStyle     Meals:  usually 2 meals per day at 10 AM and 5 PM.    Mealtime protein sources:turkey, chicken.  Eating cereal for breakfast at times Avoiding sweet drinks Physical activity: exercise: Just walking within the house              Microalbumin has been mildly increased   Wt Readings from Last 3 Encounters:   06/19/16 148 lb 9.6 oz (67.4 kg)  06/18/16 147 lb (66.7 kg)  06/15/16 147 lb (66.7 kg)   Lab Results  Component Value Date   HGBA1C 7.0 (H) 06/15/2016   HGBA1C 8.0 (H) 01/27/2016   HGBA1C 6.0 09/13/2015   Lab Results  Component Value Date   MICROALBUR 43.7 (H) 01/27/2016   LDLCALC 49 09/13/2015   CREATININE 1.64 (H) 06/15/2016   CREATININE 1.64 (H) 06/15/2016      No results found for: FRUCTOSAMINE     Lab on 06/15/2016  Component Date Value Ref Range Status  . Sodium 06/15/2016 131* 135 - 145 mEq/L Final  . Potassium 06/15/2016 5.1  3.5 - 5.1 mEq/L Final  . Chloride 06/15/2016 105  96 - 112 mEq/L Final  . CO2 06/15/2016 22  19 - 32 mEq/L Final  . Glucose, Bld 06/15/2016 114* 70 - 99 mg/dL Final  . BUN 06/15/2016 25* 6 - 23 mg/dL Final  . Creatinine, Ser 06/15/2016 1.64* 0.40 - 1.20 mg/dL Final  . Calcium 06/15/2016 9.8  8.4 - 10.5 mg/dL Final  . GFR 06/15/2016 40.60* >60.00 mL/min Final  . Hgb A1c MFr Bld 06/15/2016 7.0* 4.6 - 6.5 % Final   Glycemic Control Guidelines for People with Diabetes:Non Diabetic:  <6%Goal of Therapy: <7%Additional Action Suggested:  >8%   . Sodium 06/15/2016 131* 135 -  145 mEq/L Final  . Potassium 06/15/2016 5.1  3.5 - 5.1 mEq/L Final  . Chloride 06/15/2016 105  96 - 112 mEq/L Final  . CO2 06/15/2016 22  19 - 32 mEq/L Final  . Glucose, Bld 06/15/2016 114* 70 - 99 mg/dL Final  . BUN 06/15/2016 25* 6 - 23 mg/dL Final  . Creatinine, Ser 06/15/2016 1.64* 0.40 - 1.20 mg/dL Final  . Total Bilirubin 06/15/2016 0.2  0.2 - 1.2 mg/dL Final  . Alkaline Phosphatase 06/15/2016 85  39 - 117 U/L Final  . AST 06/15/2016 15  0 - 37 U/L Final  . ALT 06/15/2016 13  0 - 35 U/L Final  . Total Protein 06/15/2016 7.0  6.0 - 8.3 g/dL Final  . Albumin 06/15/2016 4.0  3.5 - 5.2 g/dL Final  . Calcium 06/15/2016 9.8  8.4 - 10.5 mg/dL Final  . GFR 06/15/2016 40.60* >60.00 mL/min Final    Allergies as of 06/19/2016      Reactions   Morphine And Related Other (See  Comments)   Hallucenations    Penicillins Rash   Swelling      Medication List       Accurate as of 06/19/16  2:26 PM. Always use your most recent med list.          amLODipine 10 MG tablet Commonly known as:  NORVASC Take 10 mg by mouth daily.   aspirin 325 MG EC tablet Take 325 mg by mouth daily.   atorvastatin 20 MG tablet Commonly known as:  LIPITOR TAKE ONE TABLET BY MOUTH DAILY   cilostazol 100 MG tablet Commonly known as:  PLETAL Take 100 mg by mouth 2 (two) times daily.   gabapentin 100 MG capsule Commonly known as:  NEURONTIN TAKE ONE CAPSULE BY MOUTH TWICE DAILY   glucose blood test strip Commonly known as:  FREESTYLE LITE Use as instructed to check blood sugar 2 times a day dx code E11.9   hydrochlorothiazide 12.5 MG capsule Commonly known as:  MICROZIDE TAKE ONE CAPSULE BY MOUTH DAILY   insulin regular 100 units/mL injection Commonly known as:  NOVOLIN R,HUMULIN R Inject into the skin 2 (two) times daily before a meal. 10 units in the morning and 5-7 units in the evening.   IRON PO Take 1 tablet by mouth daily.   LEVEMIR 100 UNIT/ML injection Generic drug:  insulin detemir Inject 20 Units into the skin every morning.   lisinopril 40 MG tablet Commonly known as:  PRINIVIL,ZESTRIL Take 40 mg by mouth daily.   metFORMIN 750 MG 24 hr tablet Commonly known as:  GLUCOPHAGE-XR TAKE ONE TABLET BY MOUTH ONCE DAILY   omeprazole 20 MG capsule Commonly known as:  PRILOSEC TAKE ONE CAPSULE BY MOUTH ONCE DAILY (DUE FOR OFFICE VISIT THIS MONTH).   Vitamin D (Ergocalciferol) 50000 units Caps capsule Commonly known as:  DRISDOL TAKE ONE CAPSULE BY MOUTH EVERY 7 DAYS       Allergies:  Allergies  Allergen Reactions  . Morphine And Related Other (See Comments)    Hallucenations   . Penicillins Rash    Swelling    Past Medical History:  Diagnosis Date  . Asthma    No probnlems recently  . 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 Diabetes mellitus    Type II per Dr [name]  (patient said type I)  . GERD (gastroesophageal reflux disease)   . Glaucoma   .  Hyperlipidemia   . Hypertension   . Iron deficiency anemia 03/09/2016  . Peripheral vascular disease ([place])   . Pneumonia 2006  . Shortness of breath dyspnea    with exdrtion, "Walkling too fast"  . Stroke [facility])    no residual    Past Surgical History:  Procedure Laterality Date  . ABDOMINAL HYSTERECTOMY    . CERVICAL FUSION     with graft from hip  . COLONOSCOPY  July 09, 2012  . DIRECT LARYNGOSCOPY N/A 06/07/2014   Procedure: DIRECT LARYNGOSCOPY with BIOPSY and EXCISION VOLLECULAR CYST;  Surgeon: Ruby Cola, MD;  Location: [place];  Service: ENT;  Laterality: N/A;  . REFRACTIVE SURGERY Bilateral    both eyes  . SPINE SURGERY     lumbar    Family History  Problem Relation Age of Onset  . Cancer Mother   . Heart disease Mother   . Diabetes Father   . Cancer Brother   . Cancer Brother     Social History:  reports that she has been smoking Cigarettes.  She has a 30.00 pack-year smoking history. She has never used smokeless tobacco. She reports that she does not drink alcohol or use drugs.  Review of Systems:   Blindness: She has absent vision on the right side and can see silhouettes only on the left.    HYPERTENSION:   taking amlodipine 10 mg, HCTZ 12.5 mg and lisinopril 40 mg dose.    Renal function: Her creatinine is Still  high, has been now followed by nephrologist, Does not appear to have worsened recently   Lab Results  Component Value Date   CREATININE 1.64 (H) 06/15/2016   CREATININE 1.64 (H) 06/15/2016   BUN 25 (H) 06/15/2016   BUN 25 (H) 06/15/2016   NA 131 (L) 06/15/2016   NA 131 (L) 06/15/2016   K 5.1 06/15/2016   K 5.1 06/15/2016   CL 105 06/15/2016   CL 105 06/15/2016   CO2 22 06/15/2016   CO2 22 06/15/2016     HYPERLIPIDEMIA: The lipid abnormality consists of elevated LDL controlled  with Lipitor 20 mg .  Her baseline LDL was 202 Her last LDL was 43 done in April 2018 by PCP  Lab Results  Component Value Date   CHOL 156 09/13/2015   HDL 82.20 09/13/2015   LDLCALC 49 09/13/2015   TRIG 125.0 09/13/2015   CHOLHDL 2 09/13/2015    Vitamin D: Taking 50,000 unit dosage once a month From her PCP  Lab Results  Component Value Date   VD25OH 28.68 (L) 01/27/2016       Last diabetic foot exam was done in 08/2015  Episode of syncope: She is going to be getting a loop recorder from the cardiologist, does not know why her pulse is fast TSH is normal in 12/17   Examination:   BP 138/68   Pulse (!) 106   Ht 5\' 3"  (1.6 m)   Wt 148 lb 9.6 oz (67.4 kg)   SpO2 97%   BMI 26.32 kg/m   Body mass index is 26.32 kg/m.    ASSESSMENT/ PLAN:   Diabetes type 2  See history of present illness for detailed discussion of current management, blood sugar patterns and problems identified  Her blood sugars are overall fairly well controlled with A1c 7% Again unable to know what her blood sugar patterns are as she does not monitor at home Fasting lab glucose was near normal She does appear to be benefiting from  metformin 750 mg which can be continued since her GFR is about 40  Her daughter is going to ask the nurse aide to check her blood sugar in the evenings   HYPERTENSION:   controlled  Renal dysfunction: She will continue follow-up with nephrologist    Patient Instructions  Try checking sugars 2x daily   Julie Brewer 06/19/2016, 2:26 PM

## 2016-06-19 NOTE — Patient Instructions (Signed)
Try checking sugars 2x daily

## 2016-06-20 ENCOUNTER — Encounter (HOSPITAL_COMMUNITY)
Admission: RE | Disposition: A | Discharge status: Home / Self Care | Payer: Self-pay | Source: Ambulatory Visit | Attending: Cardiovascular Disease

## 2016-06-20 ENCOUNTER — Ambulatory Visit (HOSPITAL_COMMUNITY)
Admission: RE | Admit: 2016-06-20 | Discharge: 2016-06-20 | Disposition: A | Discharge status: Home / Self Care | Payer: Medicare HMO | Source: Ambulatory Visit | Attending: Cardiovascular Disease | Admitting: Cardiovascular Disease

## 2016-06-20 DIAGNOSIS — I10 Essential (primary) hypertension: Secondary | ICD-10-CM | POA: Diagnosis not present

## 2016-06-20 DIAGNOSIS — Z7982 Long term (current) use of aspirin: Secondary | ICD-10-CM | POA: Diagnosis not present

## 2016-06-20 DIAGNOSIS — Z880 Allergy status to penicillin: Secondary | ICD-10-CM | POA: Insufficient documentation

## 2016-06-20 DIAGNOSIS — Z8673 Personal history of transient ischemic attack (TIA), and cerebral infarction without residual deficits: Secondary | ICD-10-CM | POA: Insufficient documentation

## 2016-06-20 DIAGNOSIS — E1121 Type 2 diabetes mellitus with diabetic nephropathy: Secondary | ICD-10-CM | POA: Diagnosis not present

## 2016-06-20 DIAGNOSIS — E785 Hyperlipidemia, unspecified: Secondary | ICD-10-CM | POA: Insufficient documentation

## 2016-06-20 DIAGNOSIS — K219 Gastro-esophageal reflux disease without esophagitis: Secondary | ICD-10-CM | POA: Diagnosis not present

## 2016-06-20 DIAGNOSIS — Z885 Allergy status to narcotic agent status: Secondary | ICD-10-CM | POA: Insufficient documentation

## 2016-06-20 DIAGNOSIS — J45909 Unspecified asthma, uncomplicated: Secondary | ICD-10-CM | POA: Insufficient documentation

## 2016-06-20 DIAGNOSIS — Z794 Long term (current) use of insulin: Secondary | ICD-10-CM | POA: Insufficient documentation

## 2016-06-20 DIAGNOSIS — H547 Unspecified visual loss: Secondary | ICD-10-CM | POA: Insufficient documentation

## 2016-06-20 DIAGNOSIS — E1151 Type 2 diabetes mellitus with diabetic peripheral angiopathy without gangrene: Secondary | ICD-10-CM | POA: Insufficient documentation

## 2016-06-20 DIAGNOSIS — F17210 Nicotine dependence, cigarettes, uncomplicated: Secondary | ICD-10-CM | POA: Insufficient documentation

## 2016-06-20 DIAGNOSIS — E1140 Type 2 diabetes mellitus with diabetic neuropathy, unspecified: Secondary | ICD-10-CM | POA: Insufficient documentation

## 2016-06-20 DIAGNOSIS — R55 Syncope and collapse: Secondary | ICD-10-CM | POA: Diagnosis not present

## 2016-06-20 DIAGNOSIS — H409 Unspecified glaucoma: Secondary | ICD-10-CM | POA: Diagnosis not present

## 2016-06-20 HISTORY — PX: LOOP RECORDER INSERTION: EP1214

## 2016-06-20 LAB — GLUCOSE, CAPILLARY: GLUCOSE-CAPILLARY: 156 mg/dL — AB (ref 65–99)

## 2016-06-20 SURGERY — LOOP RECORDER INSERTION

## 2016-06-20 MED ORDER — LIDOCAINE HCL (PF) 1 % IJ SOLN
INTRAMUSCULAR | Status: DC | PRN
Start: 2016-06-20 — End: 2016-06-20
  Administered 2016-06-20: 15 mL via SUBCUTANEOUS

## 2016-06-20 MED ORDER — LIDOCAINE HCL (PF) 1 % IJ SOLN
INTRAMUSCULAR | Status: AC
  Filled 2016-06-20: qty 30

## 2016-06-20 SURGICAL SUPPLY — 2 items
LOOP REVEAL LINQSYS (Prosthesis & Implant Heart) ×1 IMPLANT
PACK LOOP INSERTION (CUSTOM PROCEDURE TRAY) ×2 IMPLANT

## 2016-06-20 NOTE — H&P (View-Only) (Signed)
Cardiology Office Note:    Date:  06/18/2016   ID:  Julie Brewer, DOB 02/17/1950, MRN [id number]  PCP:  [name], MD  Cardiologist: Quay Burow, M.D.; [name], MD    Referring MD: [name], MD   Chief Complaint  Patient presents with  . Follow-up    pt has no complaints   Referred to discuss loop recorder implantation for history of syncope  History of Present Illness:    Julie Brewer is a 66 y.o. 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 She has normal left ventricular systolic function, her electrocardiograms showed normal sinus rhythm with lateral ischemic repolarization abnormalities. She is referred for loop recorder implantation. She has had at least 3 episodes of syncope with associated diaphoresis, without angina, shortness of breath or subjective palpitations. Episodes have occurred both while walking in while sitting in a chair. Each episode lasted for only a few seconds and recovery was prompt without postictal confusion, loss of sphincter tone or tonic/clonic activity. She takes amlodipine and lisinopril for blood pressure control.  She denies angina, but has chronic dyspnea. No recent focal neuro complaints. She denies edema or intermittent claudication.  Past Medical History:  Diagnosis Date  . Asthma    No probnlems recently  . 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 Diabetes mellitus    Type II per Dr [name]  (patient said type I)  . GERD (gastroesophageal reflux disease)   . Glaucoma   . Hyperlipidemia   . Hypertension   . Iron deficiency anemia 03/09/2016  . Peripheral vascular disease ([place])   . Pneumonia 2006  . Shortness of breath dyspnea    with exdrtion, "Walkling too fast"  . Stroke [facility])    no residual    Past Surgical  History:  Procedure Laterality Date  . ABDOMINAL HYSTERECTOMY    . CERVICAL FUSION     with graft from hip  . COLONOSCOPY  July 09, 2012  . DIRECT LARYNGOSCOPY N/A 06/07/2014   Procedure: DIRECT LARYNGOSCOPY with BIOPSY and EXCISION VOLLECULAR CYST;  Surgeon: Ruby Cola, MD;  Location: [place];  Service: ENT;  Laterality: N/A;  . REFRACTIVE SURGERY Bilateral    both eyes  . SPINE SURGERY     lumbar    Current Medications: Current Meds  Medication Sig  . amLODipine (NORVASC) 10 MG tablet Take 10 mg by mouth daily.   Marland Kitchen aspirin 325 MG EC tablet Take 325 mg by mouth daily.  Marland Kitchen atorvastatin (LIPITOR) 20 MG tablet TAKE ONE TABLET BY MOUTH DAILY  . cilostazol (PLETAL) 100 MG tablet Take 100 mg by mouth 2 (two) times daily.  Marland Kitchen gabapentin (NEURONTIN) 100 MG capsule TAKE ONE CAPSULE BY MOUTH TWICE DAILY  . glucose blood (FREESTYLE LITE) test strip Use as instructed to check blood sugar 2 times a day dx code E11.9  . hydrochlorothiazide (MICROZIDE) 12.5 MG capsule TAKE ONE CAPSULE BY MOUTH DAILY  . insulin detemir (LEVEMIR) 100 UNIT/ML injection Inject 20 Units into the skin every morning.  . insulin regular (NOVOLIN R,HUMULIN R) 100 units/mL injection Inject into the skin 2 (two) times daily before a meal. 10 units in the morning and 5-7 units in the evening.  . IRON PO Take 1 tablet by mouth daily.   Marland Kitchen lisinopril (PRINIVIL,ZESTRIL) 40 MG tablet Take  40 mg by mouth daily.   . metFORMIN (GLUCOPHAGE-XR) 750 MG 24 hr tablet TAKE ONE TABLET BY MOUTH ONCE DAILY  . omeprazole (PRILOSEC) 20 MG capsule TAKE ONE CAPSULE BY MOUTH ONCE DAILY (DUE FOR OFFICE VISIT THIS MONTH).  . Vitamin D, Ergocalciferol, (DRISDOL) 50000 units CAPS capsule TAKE ONE CAPSULE BY MOUTH EVERY 7 DAYS (Patient taking differently: TAKE ONE CAPSULE BY MOUTH  ONCE  A MONTH)     Allergies:   Morphine and related and Penicillins   Social History   Social History  . Marital status: Legally Separated    Spouse name: N/A  . Number  of children: N/A  . Years of education: N/A   Social History Main Topics  . Smoking status: Current Every Day Smoker    Packs/day: 1.00    Years: 30.00    Types: Cigarettes  . Smokeless tobacco: Never Used  . Alcohol use No  . Drug use: No  . Sexual activity: Not on file   Other Topics Concern  . Not on file   Social History Narrative  . No narrative on file     Family History: The patient's family history includes Cancer in her brother, brother, and mother; Diabetes in her father; Heart disease in her mother. ROS:   Please see the history of present illness.     All other systems reviewed and are negative.  EKGs/Labs/Other Studies Reviewed:     EKG:  EKG is not ordered today.    Recent Labs: 05/18/2016: HGB 11.2; Platelets 276 06/15/2016: ALT 13; BUN 25; BUN 25; Creatinine, Ser 1.64; Creatinine, Ser 1.64; Potassium 5.1; Potassium 5.1; Sodium 131; Sodium 131   Recent Lipid Panel    Component Value Date/Time   CHOL 156 09/13/2015 1010   TRIG 125.0 09/13/2015 1010   HDL 82.20 09/13/2015 1010   CHOLHDL 2 09/13/2015 1010   VLDL 25.0 09/13/2015 1010   LDLCALC 49 09/13/2015 1010    Physical Exam:    VS:  There were no vitals taken for this visit.    Wt Readings from Last 3 Encounters:  06/15/16 147 lb (66.7 kg)  05/18/16 148 lb 3.2 oz (67.2 kg)  05/15/16 147 lb (66.7 kg)     GEN:  Well nourished, well developed in no acute distress HEENT: Normal NECK: No JVD; No carotid bruits LYMPHATICS: No lymphadenopathy CARDIAC: RRR, no murmurs, rubs, gallops RESPIRATORY:  Clear to auscultation without rales, wheezing or rhonchi  ABDOMEN: Soft, non-tender, non-distended MUSCULOSKELETAL:  No edema; No deformity  SKIN: Warm and dry NEUROLOGIC:  Alert and oriented x 3 PSYCHIATRIC:  Normal affect   ASSESSMENT:    No diagnosis found. PLAN:    In order of problems listed above:  1. This patient has had 3 episodes of unexplained syncope occurring over a period of about  6 months. The etiology is unclear and arrhythmic syncope is a possibility. Implantable loop recorder is indicated. This procedure has been fully reviewed with the patient and written informed consent has been obtained.    Medication Adjustments/Labs and Tests Ordered: Current medicines are reviewed at length with the patient today.  Concerns regarding medicines are outlined above. Labs and tests ordered and medication changes are outlined in the patient instructions below:  There are no Patient Instructions on file for this visit.   Signed, [name], MD  06/18/2016 9:00 AM    [facility]

## 2016-06-20 NOTE — Op Note (Signed)
LOOP RECORDER IMPLANT   Procedure report  Procedure performed:  Loop recorder implantation   Reason for procedure:  Recurrent syncope/near-syncope  Procedure performed by:  [name], MD  Complications:  None  Estimated blood loss:  <5 mL  Medications administered during procedure:  Lidocaine 1% with 1/10,000 epinephrine 10 mL locally Device details:  Medtronic Reveal Linq model number [id number], serial number [id number] Procedure details:  After the risks and benefits of the procedure were discussed the patient provided informed consent. The patient was prepped and draped in usual sterile fashion. Local anesthesia was administered to an area 2 cm to the left of the sternum in the 4th intercostal space. A cutaneous incision was made using the incision tool. The introducer was then used to create a subcutaneous tunnel and carefully deploy the device. Local pressure was held to ensure hemostasis.  The incision was closed with SteriStrips and a sterile dressing was applied.  R waves 0.40 mV.  [name], MD, [place] [phone] office [phone] pager 06/20/2016 4:40 PM

## 2016-06-20 NOTE — Interval H&P Note (Signed)
History and Physical Interval Note:  06/20/2016 3:40 PM  Julie Brewer  has presented today for surgery, with the diagnosis of syncope  The various methods of treatment have been discussed with the patient and family. After consideration of risks, benefits and other options for treatment, the patient has consented to  Procedure(s): Loop Recorder Insertion (N/A) as a surgical intervention .  The patient's history has been reviewed, patient examined, no change in status, stable for surgery.  I have reviewed the patient's chart and labs.  Questions were answered to the patient's satisfaction.     Kermitt Harjo

## 2016-06-21 ENCOUNTER — Encounter (HOSPITAL_COMMUNITY): Payer: Self-pay | Admitting: Cardiovascular Disease

## 2016-06-25 ENCOUNTER — Other Ambulatory Visit: Payer: Self-pay | Admitting: Endocrinology

## 2016-06-26 ENCOUNTER — Other Ambulatory Visit: Payer: Self-pay | Admitting: Cardiology

## 2016-07-02 ENCOUNTER — Ambulatory Visit (INDEPENDENT_AMBULATORY_CARE_PROVIDER_SITE_OTHER): Payer: Medicare HMO | Admitting: *Deleted

## 2016-07-02 DIAGNOSIS — R55 Syncope and collapse: Secondary | ICD-10-CM

## 2016-07-02 NOTE — Progress Notes (Signed)
Wound Loop check in clinic. Steri-Strips removed, incision edges approximated. No redness or swelling. Battery status: Good. R-waves 0.79mV. 1 symptom episodes ~ from implant, 0 tachy episodes, 0 pause episodes, 0 brady episodes. 0 AF episodes. Monthly summary reports and ROV with [facility] PRN.

## 2016-07-04 ENCOUNTER — Other Ambulatory Visit: Payer: Self-pay | Admitting: Surgery

## 2016-07-10 ENCOUNTER — Other Ambulatory Visit: Payer: Self-pay | Admitting: Cardiovascular Disease

## 2016-07-20 ENCOUNTER — Ambulatory Visit (INDEPENDENT_AMBULATORY_CARE_PROVIDER_SITE_OTHER): Payer: Medicare HMO | Admitting: *Deleted

## 2016-07-20 DIAGNOSIS — R55 Syncope and collapse: Secondary | ICD-10-CM

## 2016-07-25 NOTE — Progress Notes (Signed)
[facility] Summary Report / Loop Recorder 

## 2016-07-27 LAB — CUP PACEART REMOTE DEVICE CHECK
Date Time Interrogation Session: 20180622204008
MDC IDC PG IMPLANT DT: 20180523

## 2016-08-01 ENCOUNTER — Other Ambulatory Visit: Payer: Self-pay | Admitting: Endocrinology

## 2016-08-06 DIAGNOSIS — Z23 Encounter for immunization: Secondary | ICD-10-CM | POA: Diagnosis not present

## 2016-08-18 ENCOUNTER — Ambulatory Visit (HOSPITAL_COMMUNITY)
Admission: EM | Admit: 2016-08-18 | Discharge: 2016-08-18 | Disposition: A | Discharge status: Home / Self Care | Payer: Medicare HMO | Attending: Family Medicine | Admitting: Family Medicine

## 2016-08-18 ENCOUNTER — Encounter (HOSPITAL_COMMUNITY): Payer: Self-pay | Admitting: *Deleted

## 2016-08-18 DIAGNOSIS — N764 Abscess of vulva: Secondary | ICD-10-CM | POA: Diagnosis not present

## 2016-08-18 MED ORDER — HYDROCODONE-ACETAMINOPHEN 5-325 MG PO TABS: 2.0000 | ORAL_TABLET | ORAL | 0 refills | Status: DC | PRN

## 2016-08-18 NOTE — ED Provider Notes (Signed)
CSN: [id number]     Arrival date & time 08/18/16  1350 History   None    Chief Complaint  Patient presents with  . Abscess   (Consider location/radiation/quality/duration/timing/severity/associated sxs/prior Treatment) Patient c/o abscess on her left labia    The history is provided by the patient.  Abscess  Abscess location: left labia. Abscess quality: painful and redness   Red streaking: no   Duration:  3 days Progression:  Worsening Pain details:    Quality:  Throbbing   Severity:  Moderate   Duration:  3 days   Timing:  Constant Chronicity:  New Relieved by:  Nothing Worsened by:  Nothing Ineffective treatments:  None tried   Past Medical History:  Diagnosis Date  . Asthma    No probnlems recently  . 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 Diabetes mellitus    Type II per Dr [name]  (patient said type I)  . GERD (gastroesophageal reflux disease)   . Glaucoma   . Hyperlipidemia   . Hypertension   . Iron deficiency anemia 03/09/2016  . Peripheral vascular disease ([place])   . Pneumonia 2006  . Shortness of breath dyspnea    with exdrtion, "Walkling too fast"  . Stroke [facility])    no residual   Past Surgical History:  Procedure Laterality Date  . ABDOMINAL HYSTERECTOMY    . CERVICAL FUSION     with graft from hip  . COLONOSCOPY  July 09, 2012  . DIRECT LARYNGOSCOPY N/A 06/07/2014   Procedure: DIRECT LARYNGOSCOPY with BIOPSY and EXCISION VOLLECULAR CYST;  Surgeon: Ruby Cola, MD;  Location: [place];  Service: ENT;  Laterality: N/A;  . LOOP RECORDER INSERTION N/A 06/20/2016   Procedure: Loop Recorder Insertion;  Surgeon: [name], MD;  Location: [place] [facility];  Service: Cardiovascular;  Laterality: N/A;  . REFRACTIVE SURGERY Bilateral    both eyes  . SPINE SURGERY     lumbar   Family History  Problem Relation Age of Onset  . Cancer Mother   . Heart disease Mother   . Diabetes Father   . Cancer  Brother   . Cancer Brother    Social History  Substance Use Topics  . Smoking status: Current Every Day Smoker    Packs/day: 1.00    Years: 30.00    Types: Cigarettes  . Smokeless tobacco: Never Used  . Alcohol use No   OB History    No data available     Review of Systems  Constitutional: Negative.   HENT: Negative.   Eyes: Negative.   Respiratory: Negative.   Cardiovascular: Negative.   Endocrine: Negative.   Genitourinary: Positive for vaginal pain.  Musculoskeletal: Negative.   Allergic/Immunologic: Negative.   Neurological: Negative.   Hematological: Negative.   Psychiatric/Behavioral: Negative.     Allergies  Morphine and related and Penicillins  Home Medications   Prior to Admission medications   Medication Sig Start Date End Date Taking? Authorizing Provider  amLODipine (NORVASC) 10 MG tablet Take 10 mg by mouth daily.  05/19/15   [provider]  aspirin 325 MG EC tablet Take 325 mg by mouth daily.    [provider]  atorvastatin (LIPITOR) 20 MG tablet TAKE ONE TABLET BY MOUTH DAILY 06/01/16   Elayne Snare, MD  cilostazol (PLETAL) 100 MG tablet TAKE ONE TABLET BY MOUTH TWICE DAILY 07/04/16   Serafina Mitchell, MD  gabapentin (NEURONTIN) 100  MG capsule TAKE ONE CAPSULE BY MOUTH TWICE DAILY 06/26/16   Elayne Snare, MD  glucose blood (FREESTYLE LITE) test strip Use as instructed to check blood sugar 2 times a day dx code E11.9 Patient not taking: Reported on 06/20/2016 03/05/14   Elayne Snare, MD  hydrochlorothiazide (MICROZIDE) 12.5 MG capsule TAKE ONE CAPSULE BY MOUTH DAILY 03/05/16   Elayne Snare, MD  HYDROcodone-acetaminophen (NORCO/VICODIN) 5-325 MG tablet Take 2 tablets by mouth every 4 (four) hours as needed. 08/18/16   Lysbeth Penner, FNP  insulin detemir (LEVEMIR) 100 UNIT/ML injection Inject 20 Units into the skin every morning.    [provider]  insulin regular (NOVOLIN R,HUMULIN R) 100 units/mL injection Inject 5-10 Units into the skin  See admin instructions. 10 units in the morning and 5-7 units in the evening.    [provider]  IRON PO Take 1 tablet by mouth daily.     [provider]  lisinopril (PRINIVIL,ZESTRIL) 40 MG tablet Take 40 mg by mouth daily.  05/11/15   [provider]  metFORMIN (GLUCOPHAGE-XR) 750 MG 24 hr tablet TAKE 1 TABLET BY MOUTH ONCE DAILY 08/01/16   Elayne Snare, MD  omeprazole (PRILOSEC) 20 MG capsule TAKE ONE CAPSULE BY MOUTH ONCE DAILY (DUE FOR OFFICE VISIT THIS MONTH). 05/03/16   Elayne Snare, MD  Vitamin D, Ergocalciferol, (DRISDOL) 50000 units CAPS capsule TAKE ONE CAPSULE BY MOUTH EVERY 7 DAYS Patient taking differently: TAKE ONE CAPSULE BY MOUTH  ONCE  A MONTH 12/26/15   Elayne Snare, MD   Meds Ordered and Administered this Visit  Medications - No data to display  BP (!) 170/72 (BP Location: Right Arm)   Pulse 78   Temp 98.6 F (37 C) (Oral)   Resp 18   SpO2 100%  No data found.   Physical Exam  Constitutional: She appears well-developed and well-nourished.  HENT:  Head: Normocephalic and atraumatic.  Eyes: Pupils are equal, round, and reactive to light. Conjunctivae and EOM are normal.  Neck: Normal range of motion. Neck supple.  Cardiovascular: Normal rate, regular rhythm and normal heart sounds.   Pulmonary/Chest: Effort normal and breath sounds normal.  Genitourinary:  Genitourinary Comments: Left labia with swelling and pain.  Nursing note and vitals reviewed.   Urgent Care Course     .Marland KitchenIncision and Drainage Date/Time: 08/18/2016 4:18 PM Performed by: Lysbeth Penner Authorized by: Lysbeth Penner   Consent:    Consent obtained:  Verbal   Consent given by:  Patient   Risks discussed:  Bleeding, incomplete drainage and pain   Alternatives discussed:  No treatment Location:    Type:  Abscess   Size:  2 cm   Location: left labia. Pre-procedure details:    Skin preparation:  Betadine Anesthesia (see MAR for exact dosages):    Anesthesia  method:  Local infiltration   Local anesthetic:  Lidocaine 1% WITH epi Procedure type:    Complexity:  Simple Procedure details:    Needle aspiration: no     Incision types:  Stab incision   Incision depth:  Dermal   Scalpel blade:  11   Wound management:  Probed and deloculated   Drainage:  Bloody and purulent   Wound treatment:  Wound left open and drain placed   Packing materials:  1/2 in iodoform gauze Post-procedure details:    Patient tolerance of procedure:  Tolerated well, no immediate complications   (including critical care time)  Labs Review Labs Reviewed - No data  to display  Imaging Review No results found.   Visual Acuity Review  Right Eye Distance:   Left Eye Distance:   Bilateral Distance:    Right Eye Near:   Left Eye Near:    Bilateral Near:         MDM   1. Labial abscess    Incision and drainage  Norco 5/325 one po q 6 hours prn #6  Take packing out tomorrow and follow up with PCP or with this office in 3 days.      Lysbeth Penner, [place] 08/18/16 [phone]

## 2016-08-18 NOTE — ED Triage Notes (Signed)
Pt  Has  A  Boil  On   The  l   Side    Of  Her  Vaginal  Area         She  Is  A  Diabetic          Who    Has  Had   An  abcess   In  That     Area   In  Past       She  Is  Legally blind

## 2016-08-18 NOTE — ED Notes (Signed)
Provided pillow, repositioning of bed. Declined blanket

## 2016-08-20 ENCOUNTER — Ambulatory Visit (INDEPENDENT_AMBULATORY_CARE_PROVIDER_SITE_OTHER): Payer: Medicare HMO | Admitting: *Deleted

## 2016-08-20 DIAGNOSIS — R55 Syncope and collapse: Secondary | ICD-10-CM | POA: Diagnosis not present

## 2016-08-20 DIAGNOSIS — N750 Cyst of Bartholin's gland: Secondary | ICD-10-CM | POA: Diagnosis not present

## 2016-08-21 ENCOUNTER — Ambulatory Visit (INDEPENDENT_AMBULATORY_CARE_PROVIDER_SITE_OTHER): Payer: Medicare HMO | Admitting: Podiatry

## 2016-08-21 ENCOUNTER — Encounter: Payer: Self-pay | Admitting: Podiatry

## 2016-08-21 DIAGNOSIS — E1151 Type 2 diabetes mellitus with diabetic peripheral angiopathy without gangrene: Secondary | ICD-10-CM | POA: Diagnosis not present

## 2016-08-21 DIAGNOSIS — Q828 Other specified congenital malformations of skin: Secondary | ICD-10-CM

## 2016-08-21 DIAGNOSIS — M79676 Pain in unspecified toe(s): Secondary | ICD-10-CM

## 2016-08-21 DIAGNOSIS — B351 Tinea unguium: Secondary | ICD-10-CM

## 2016-08-21 NOTE — Progress Notes (Signed)
Patient ID: Julie Brewer, female   DOB: 10-Jul-1950, 66 y.o.   MRN: [id number]   Subjective: This patient presents again for a scheduled visit complaining of thickened and elongated toenails which are uncomfortable walking wearing shoes as well as a painful plantar callus on the right foot. She presents for debridement of these nails and painful plantar callus  Objective:  DP pulse right 1/4 and DP left 0/4 bilaterally PT pulses 2/4 right and 0/4 left Capillary reflex within normal limits right delayed left Sensation to 10 g monofilament wire intact 4/5 bilaterally Vibratory sensation reactive bilaterally Ankle reflexes reactive bilaterally Hammertoe second bilaterally HAV left No open skin lesions bilaterally Atrophic skin with absent hair growth bilaterally The toenails are hypertrophic, elongated, discolored, deformed and tender direct palpation 6-10 Plantar nucleated keratoses subsecond and fifth MPJ right  Assessment: 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

## 2016-08-21 NOTE — Progress Notes (Signed)
[facility] Summary Report / Loop Recorder 

## 2016-08-21 NOTE — Patient Instructions (Signed)

## 2016-08-23 ENCOUNTER — Encounter: Payer: Self-pay | Admitting: Obstetrics and Gynecology

## 2016-08-23 ENCOUNTER — Ambulatory Visit (INDEPENDENT_AMBULATORY_CARE_PROVIDER_SITE_OTHER): Payer: Medicare HMO | Admitting: Obstetrics and Gynecology

## 2016-08-23 VITALS — BP 118/60 | HR 84 | Resp 16 | Ht 63.25 in | Wt 145.0 lb

## 2016-08-23 DIAGNOSIS — N751 Abscess of Bartholin's gland: Secondary | ICD-10-CM

## 2016-08-23 NOTE — Progress Notes (Signed)
GYNECOLOGY  VISIT   HPI: 66 y.o.   Legally Separated  African American  female   [place] with No LMP recorded. Patient has had a hysterectomy.   here for bartholin's gland cyst. Per patient her niece Doree is her caregiver and lives with her. Patient's daughter is lives 5 minutes down the street per patient.  Staff noted the that niece was somewhat short with the patient when she checked in for care, stating that someone else should have brought her here. Patient privately states she is safe in her home.  She has raised her niece from infancy as her brother is deceased.  Her niece is in her 43s now.  She can be verbally aggressive with her, but the patient states she knows how to deal with it.  Her niece is not physically or emotional abusive to her.   Notes soreness on left labia starting one week ago.  Had an enlarging area. Used warm compresses without relief.  Went to [facility] who lanced the cyst on 08/18/16.  Started draining.  Saw Dr. Justin Mend on 08/20/16 and started Doxycycline 100 mg po bid for 10 days.  Tolerating this well.  Feels like she is improving.  Niece is noting it is still draining. No fever, shakes, or chills.   States she had infection in the right groin and developed sepsis in Feb 2006.   Has diabetes.   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  GYNECOLOGIC HISTORY: No LMP recorded. Patient has had a hysterectomy. Contraception:  Hysterectomy - 2000 Menopausal hormone therapy:  none Last mammogram:  05/29/16 BIRADS 1 negative/density b -- per patient has a chip in Right breast Last pap smear:   Years ago prior to having hysterectomy -- per patient no history of abnormal pap smear        OB History    Gravida Para Term Preterm AB Living   1 1 0 0 0 1   SAB TAB Ectopic Multiple Live Births   0 0 0 0 0         Patient Active Problem List   Diagnosis Date Noted  . CRI (chronic renal insufficiency), stage 3 (moderate) 05/01/2016  . History of TIA  (transient ischemic attack) 05/01/2016  . Blind 05/01/2016  . Iron deficiency anemia 03/09/2016  . Syncope and collapse 01/16/2016  . Dysphagia, pharyngoesophageal phase 05/05/2014  . Insulin dependent diabetes mellitus with complications ([place]) 53/97/6734  . Essential hypertension 10/06/2012  . Dyslipidemia 09/22/2012  . Peripheral vascular disease ([place]) 09/01/2012    Past Medical History:  Diagnosis Date  . Asthma    No probnlems recently  . 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 Diabetes mellitus    Type II per Dr [name]  (patient said type I)  . Fibroid   . GERD (gastroesophageal reflux disease)   . Glaucoma   . Hyperlipidemia   . Hypertension   . Iron deficiency anemia 03/09/2016  . Peripheral vascular disease ([place])   . Pneumonia 2006  . Shortness of breath dyspnea    with exdrtion, "Walkling too fast"  . Stroke [facility])    no residual    Past Surgical History:  Procedure Laterality Date  . ABDOMINAL HYSTERECTOMY    . CERVICAL FUSION     with graft from hip  . COLONOSCOPY  July 09, 2012  . DIRECT LARYNGOSCOPY N/A 06/07/2014   Procedure: DIRECT LARYNGOSCOPY with BIOPSY and  EXCISION VOLLECULAR CYST;  Surgeon: Ruby Cola, MD;  Location: [place];  Service: ENT;  Laterality: N/A;  . LOOP RECORDER INSERTION N/A 06/20/2016   Procedure: Loop Recorder Insertion;  Surgeon: [name], MD;  Location: [place] [facility];  Service: Cardiovascular;  Laterality: N/A;  . REFRACTIVE SURGERY Bilateral    both eyes  . SPINE SURGERY     lumbar    Current Outpatient Prescriptions  Medication Sig Dispense Refill  . amLODipine (NORVASC) 10 MG tablet Take 10 mg by mouth daily.     Marland Kitchen aspirin 325 MG EC tablet Take 325 mg by mouth daily.    Marland Kitchen atorvastatin (LIPITOR) 20 MG tablet TAKE ONE TABLET BY MOUTH DAILY 90 tablet 3  . cilostazol (PLETAL) 100 MG tablet TAKE ONE TABLET BY MOUTH TWICE DAILY 60 tablet 6  . doxycycline (MONODOX) 100 MG  capsule Take 1 capsule by mouth 2 (two) times daily.    Marland Kitchen gabapentin (NEURONTIN) 100 MG capsule TAKE ONE CAPSULE BY MOUTH TWICE DAILY 180 capsule 1  . glucose blood (FREESTYLE LITE) test strip Use as instructed to check blood sugar 2 times a day dx code E11.9 200 each 1  . hydrochlorothiazide (MICROZIDE) 12.5 MG capsule TAKE ONE CAPSULE BY MOUTH DAILY 90 capsule 1  . HYDROcodone-acetaminophen (NORCO/VICODIN) 5-325 MG tablet Take 2 tablets by mouth every 4 (four) hours as needed. 6 tablet 0  . insulin detemir (LEVEMIR) 100 UNIT/ML injection Inject 20 Units into the skin every morning.    . insulin regular (NOVOLIN R,HUMULIN R) 100 units/mL injection Inject 5-10 Units into the skin See admin instructions. 10 units in the morning and 5-7 units in the evening.    . IRON PO Take 1 tablet by mouth daily.     Marland Kitchen lisinopril (PRINIVIL,ZESTRIL) 40 MG tablet Take 40 mg by mouth daily.     . metFORMIN (GLUCOPHAGE-XR) 750 MG 24 hr tablet TAKE 1 TABLET BY MOUTH ONCE DAILY 90 tablet 0  . omeprazole (PRILOSEC) 20 MG capsule TAKE ONE CAPSULE BY MOUTH ONCE DAILY (DUE FOR OFFICE VISIT THIS MONTH). 90 capsule 1  . Vitamin D, Ergocalciferol, (DRISDOL) 50000 units CAPS capsule TAKE ONE CAPSULE BY MOUTH EVERY 7 DAYS (Patient taking differently: TAKE ONE CAPSULE BY MOUTH  ONCE  A MONTH) 12 capsule 0   No current facility-administered medications for this visit.      ALLERGIES: Morphine and related and Penicillins  Family History  Problem Relation Age of Onset  . Cancer Mother   . Heart disease Mother   . Diabetes Father   . Cancer Brother   . Cancer Brother   . Throat cancer Brother     Social History   Social History  . Marital status: Legally Separated    Spouse name: N/A  . Number of children: N/A  . Years of education: N/A   Occupational History  . Not on file.   Social History Main Topics  . Smoking status: Current Every Day Smoker    Packs/day: 1.00    Years: 30.00    Types: Cigarettes  .  Smokeless tobacco: Never Used  . Alcohol use Yes     Comment: socially  . Drug use: No  . Sexual activity: No     Comment: Hysterectomy   Other Topics Concern  . Not on file   Social History Narrative  . No narrative on file    ROS:  Pertinent items are noted in HPI.  PHYSICAL EXAMINATION:    BP  118/60 (BP Location: Right Arm, Patient Position: Sitting, Cuff Size: Normal)   Pulse 84   Resp 16   Ht 5' 3.25" (1.607 m)   Wt 145 lb (65.8 kg)   BMI 25.48 kg/m     General appearance: alert, cooperative and appears stated age Head: Normocephalic, without obvious abnormality, atraumatic Neck: no adenopathy, supple, symmetrical, trachea midline and thyroid normal to inspection and palpation Lungs: clear to auscultation bilaterally Heart: regular rate and rhythm Abdomen: obese, soft, non-tender, no masses,  no organomegaly   Pelvic: External genitalia:  Minimally enlarged left Bartholin's gland abscess.              Urethra:  normal appearing urethra with no masses, tenderness or lesions              Bartholins and Skenes: normal                 Vagina: normal appearing vagina with normal color and discharge, no lesions              Cervix: absent.                Bimanual Exam:  Uterus:   Absent.              Adnexa: no mass, fullness, tenderness              Rectal exam: Yes.  .  Confirms.              Anus:  normal sphincter tone, no lesions  Chaperone was present for exam.  ASSESSMENT  Left Bartholins' gland abscess.  Essentially resolved. On course of Doxycycline.  Blindness.    PLAN  Brief discussion of bartholin's abscess care. Complete abx course.  Return for recurrent cyst/pain.  Follow up prn.     An After Visit Summary was printed and given to the patient.  ___30__ minutes face to face time of which over 50% was spent in counseling.

## 2016-08-23 NOTE — Patient Instructions (Addendum)
Please return if the cyst returns! Bartholin Cyst or Abscess A Bartholin cyst is a fluid-filled sac that forms on a Bartholin gland. Bartholin glands are small glands that are found in the folds of skin (labia) on the sides of the lower opening of the vagina. This type of cyst causes a bulge on the side of the vagina. A cyst that is not large or infected may not cause problems. However, if the fluid in the cyst becomes infected, the cyst can turn into an abscess. An abscess may cause discomfort or pain. Follow these instructions at home:  Take medicines only as told by your doctor.  If you were prescribed an antibiotic medicine, finish all of it even if you start to feel better.  Apply warm, wet compresses to the area or take warm, shallow baths that cover your pelvic area (sitz baths). Do this many times each day or as told by your doctor.  Do not squeeze the cyst. Do not apply heavy pressure to it.  Do not have sex until the cyst has gone away.  If your cyst or abscess was opened by your doctor, a small piece of gauze or a drain may have been placed in the area. That lets the cyst drain. Do not remove the gauze or the drain until your doctor tells you it is okay to do that.  Do not wear tampons. Wear feminine pads as needed for any fluid or blood.  Keep all follow-up visits as told by your doctor. This is important. Contact a doctor if:  Your pain, puffiness (swelling), or redness in the area of the cyst gets worse.  You have fluid or pus pus coming from the cyst.  You have a fever. This information is not intended to replace advice given to you by your health care provider. Make sure you discuss any questions you have with your health care provider. Document Released: 04/13/2008 Document Revised: 06/23/2015 Document Reviewed: 08/31/2013 Elsevier Interactive Patient Education  2018 [employer].

## 2016-08-29 ENCOUNTER — Other Ambulatory Visit: Payer: Self-pay | Admitting: Endocrinology

## 2016-09-02 LAB — CUP PACEART REMOTE DEVICE CHECK
Implantable Pulse Generator Implant Date: 20180523
MDC IDC SESS DTM: 20180722214002

## 2016-09-02 NOTE — Progress Notes (Signed)
Carelink summary report received. Battery status OK. Normal device function. No new symptom episodes, tachy episodes, brady, or pause episodes. No new AF episodes. Monthly summary reports and ROV/PRN 

## 2016-09-12 ENCOUNTER — Encounter: Payer: Self-pay | Admitting: Family

## 2016-09-13 ENCOUNTER — Other Ambulatory Visit (INDEPENDENT_AMBULATORY_CARE_PROVIDER_SITE_OTHER): Payer: Medicare HMO

## 2016-09-13 DIAGNOSIS — E559 Vitamin D deficiency, unspecified: Secondary | ICD-10-CM | POA: Diagnosis not present

## 2016-09-13 DIAGNOSIS — E1165 Type 2 diabetes mellitus with hyperglycemia: Secondary | ICD-10-CM

## 2016-09-13 DIAGNOSIS — Z794 Long term (current) use of insulin: Secondary | ICD-10-CM | POA: Diagnosis not present

## 2016-09-13 LAB — BASIC METABOLIC PANEL
BUN: 21 mg/dL (ref 6–23)
CALCIUM: 9.3 mg/dL (ref 8.4–10.5)
CO2: 21 mEq/L (ref 19–32)
Chloride: 103 mEq/L (ref 96–112)
Creatinine, Ser: 1.46 mg/dL — ABNORMAL HIGH (ref 0.40–1.20)
GFR: 46.39 mL/min — AB (ref >60.00)
GLUCOSE: 107 mg/dL — AB (ref 70–99)
POTASSIUM: 4.8 meq/L (ref 3.5–5.1)
SODIUM: 129 meq/L — AB (ref 135–145)

## 2016-09-13 LAB — VITAMIN D 25 HYDROXY (VIT D DEFICIENCY, FRACTURES): VITD: 40.22 ng/mL (ref 30.00–100.00)

## 2016-09-13 LAB — HEMOGLOBIN A1C: Hgb A1c MFr Bld: 6.6 % — ABNORMAL HIGH (ref 4.6–6.5)

## 2016-09-17 ENCOUNTER — Encounter (HOSPITAL_COMMUNITY): Payer: Medicare HMO

## 2016-09-17 ENCOUNTER — Ambulatory Visit: Payer: Medicare HMO | Admitting: Family

## 2016-09-18 ENCOUNTER — Ambulatory Visit (INDEPENDENT_AMBULATORY_CARE_PROVIDER_SITE_OTHER): Payer: Medicare HMO | Admitting: *Deleted

## 2016-09-18 DIAGNOSIS — R55 Syncope and collapse: Secondary | ICD-10-CM | POA: Diagnosis not present

## 2016-09-19 ENCOUNTER — Ambulatory Visit (INDEPENDENT_AMBULATORY_CARE_PROVIDER_SITE_OTHER): Payer: Medicare HMO | Admitting: Endocrinology

## 2016-09-19 ENCOUNTER — Encounter: Payer: Self-pay | Admitting: Endocrinology

## 2016-09-19 VITALS — BP 136/60 | HR 92 | Ht 63.25 in | Wt 145.8 lb

## 2016-09-19 DIAGNOSIS — I10 Essential (primary) hypertension: Secondary | ICD-10-CM

## 2016-09-19 DIAGNOSIS — E1165 Type 2 diabetes mellitus with hyperglycemia: Secondary | ICD-10-CM | POA: Diagnosis not present

## 2016-09-19 DIAGNOSIS — E559 Vitamin D deficiency, unspecified: Secondary | ICD-10-CM | POA: Diagnosis not present

## 2016-09-19 DIAGNOSIS — E871 Hypo-osmolality and hyponatremia: Secondary | ICD-10-CM | POA: Diagnosis not present

## 2016-09-19 DIAGNOSIS — Z794 Long term (current) use of insulin: Secondary | ICD-10-CM | POA: Diagnosis not present

## 2016-09-19 LAB — GLUCOSE, POCT (MANUAL RESULT ENTRY): POC Glucose: 154 mg/dl — AB (ref 70–99)

## 2016-09-19 MED ORDER — INDAPAMIDE 1.25 MG PO TABS: 1.2500 mg | ORAL_TABLET | Freq: Every day | ORAL | 3 refills | Status: DC

## 2016-09-19 NOTE — Progress Notes (Signed)
Patient ID: Julie Brewer, female   DOB: 06-15-1950, 67 y.o.   MRN: [id number]   Reason for Appointment: Follow-up  History of Present Illness   Diagnosis: Type 2 DIABETES MELITUS, date of diagnosis:  1985  Prior history: She has been on insulin since diagnosis and on Lantus previously Also at some point had been started on Glucophage several years ago also Because of insurance preference Lantus was changed to Levemir  She refuses to use analog rapid acting insulin because of cost and is using regular insulin for several years   Her blood sugars are generally well controlled and A1c usually under 7%  Recent history:   Insulin regimen: Levemir insulin 20 in the morning once daily. Regular insulin 30 minutes Before eating, 10 units a.m. and 5-7 ac supper  Oral hypoglycemic drugs: Metformin ER 750    Her A1c was higher with stopping metformin at 8% and this was restarted in 1/18  This is now down to 6.6 %  Current blood sugar patterns, management and problems:  She is unable to check her blood sugar again as she does not have any help at home  Lab glucose was 107 fasting  Her insulin dose has been continued about the same for some time  She is taking 10 units to cover her breakfast in the morning and is eating mostly cereal although sometimes eating eggs  She is has history of hypoglycemia unawareness and does not think she has had any recent episodes that she knows of  She is trying to take her insulin as directed 30 minutes before eating and the Levemir in the morning consistently  Blood sugar is 154 after breakfast   She will adjust her evening dose at suppertime based on how much carbohydrate she is getting, taking 2 units more for potatoes or more bread  Side effects from medications: None      Monitors blood glucose: Not at all  Glucometer:  FreeStyle     Meals:  usually 2 meals per day at 10 AM and 5 PM.    Mealtime protein sources:turkey, chicken.   Eating cereal for breakfast at times Avoiding sweet drinks Physical activity: exercise: Just walking within the house              Microalbumin has been mildly increased   Wt Readings from Last 3 Encounters:  09/19/16 145 lb 12.8 oz (66.1 kg)  08/23/16 145 lb (65.8 kg)  06/20/16 148 lb (67.1 kg)   Lab Results  Component Value Date   HGBA1C 6.6 (H) 09/13/2016   HGBA1C 7.0 (H) 06/15/2016   HGBA1C 8.0 (H) 01/27/2016   Lab Results  Component Value Date   MICROALBUR 43.7 (H) 01/27/2016   LDLCALC 49 09/13/2015   CREATININE 1.46 (H) 09/13/2016      No results found for: FRUCTOSAMINE     Office Visit on 09/19/2016  Component Date Value Ref Range Status  . POC Glucose 09/19/2016 154* 70 - 99 mg/dl Final  Lab on 09/13/2016  Component Date Value Ref Range Status  . Hgb A1c MFr Bld 09/13/2016 6.6* 4.6 - 6.5 % Final   Glycemic Control Guidelines for People with Diabetes:Non Diabetic:  <6%Goal of Therapy: <7%Additional Action Suggested:  >8%   . Sodium 09/13/2016 129* 135 - 145 mEq/L Final  . Potassium 09/13/2016 4.8  3.5 - 5.1 mEq/L Final  . Chloride 09/13/2016 103  96 - 112 mEq/L Final  . CO2 09/13/2016 21  19 - 32 mEq/L  Final  . Glucose, Bld 09/13/2016 107* 70 - 99 mg/dL Final  . BUN 09/13/2016 21  6 - 23 mg/dL Final  . Creatinine, Ser 09/13/2016 1.46* 0.40 - 1.20 mg/dL Final  . Calcium 09/13/2016 9.3  8.4 - 10.5 mg/dL Final  . GFR 09/13/2016 46.39* >60.00 mL/min Final  . VITD 09/13/2016 40.22  30.00 - 100.00 ng/mL Final    Allergies as of 09/19/2016      Reactions   Morphine And Related Other (See Comments)   Hallucenations    Penicillins Rash, Other (See Comments)   Swelling      Medication List       Accurate as of 09/19/16  2:00 PM. Always use your most recent med list.          amLODipine 10 MG tablet Commonly known as:  NORVASC Take 10 mg by mouth daily.   aspirin 325 MG EC tablet Take 325 mg by mouth daily.   atorvastatin 20 MG tablet Commonly known  as:  LIPITOR TAKE ONE TABLET BY MOUTH DAILY   cilostazol 100 MG tablet Commonly known as:  PLETAL TAKE ONE TABLET BY MOUTH TWICE DAILY   doxycycline 100 MG capsule Commonly known as:  MONODOX Take 1 capsule by mouth 2 (two) times daily.   gabapentin 100 MG capsule Commonly known as:  NEURONTIN TAKE ONE CAPSULE BY MOUTH TWICE DAILY   glucose blood test strip Commonly known as:  FREESTYLE LITE Use as instructed to check blood sugar 2 times a day dx code E11.9   HYDROcodone-acetaminophen 5-325 MG tablet Commonly known as:  NORCO/VICODIN Take 2 tablets by mouth every 4 (four) hours as needed.   indapamide 1.25 MG tablet Commonly known as:  LOZOL Take 1 tablet (1.25 mg total) by mouth daily.   insulin regular 100 units/mL injection Commonly known as:  NOVOLIN R,HUMULIN R Inject 5-10 Units into the skin See admin instructions. 10 units in the morning and 5-7 units in the evening.   IRON PO Take 1 tablet by mouth daily.   LEVEMIR 100 UNIT/ML injection Generic drug:  insulin detemir Inject 20 Units into the skin every morning.   lisinopril 40 MG tablet Commonly known as:  PRINIVIL,ZESTRIL Take 40 mg by mouth daily.   metFORMIN 750 MG 24 hr tablet Commonly known as:  GLUCOPHAGE-XR TAKE 1 TABLET BY MOUTH ONCE DAILY   omeprazole 20 MG capsule Commonly known as:  PRILOSEC TAKE ONE CAPSULE BY MOUTH ONCE DAILY (DUE FOR OFFICE VISIT THIS MONTH).   Vitamin D (Ergocalciferol) 50000 units Caps capsule Commonly known as:  DRISDOL TAKE ONE CAPSULE BY MOUTH EVERY 7 DAYS            Discharge Care Instructions        Start     Ordered   09/19/16 0000  POCT glucose (manual entry)     09/19/16 1043   09/19/16 0000  Hemoglobin A1c     09/19/16 1043   09/19/16 0000  Comprehensive metabolic panel     75/10/25 1043   09/19/16 0000  Microalbumin / creatinine urine ratio     09/19/16 1043   09/19/16 0000  TSH     09/19/16 1043   09/19/16 0000  indapamide (LOZOL) 1.25 MG  tablet  Daily     09/19/16 1043      Allergies:  Allergies  Allergen Reactions  . Morphine And Related Other (See Comments)    Hallucenations   . Penicillins Rash and Other (See Comments)  Swelling    Past Medical History:  Diagnosis Date  . Asthma    No probnlems recently  . 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 Diabetes mellitus    Type II per Dr [name]  (patient said type I)  . Fibroid   . GERD (gastroesophageal reflux disease)   . Glaucoma   . Hyperlipidemia   . Hypertension   . Iron deficiency anemia 03/09/2016  . Peripheral vascular disease ([place])   . Pneumonia 2006  . Shortness of breath dyspnea    with exdrtion, "Walkling too fast"  . Stroke [facility])    no residual    Past Surgical History:  Procedure Laterality Date  . ABDOMINAL HYSTERECTOMY    . CERVICAL FUSION     with graft from hip  . COLONOSCOPY  July 09, 2012  . DIRECT LARYNGOSCOPY N/A 06/07/2014   Procedure: DIRECT LARYNGOSCOPY with BIOPSY and EXCISION VOLLECULAR CYST;  Surgeon: Ruby Cola, MD;  Location: [place];  Service: ENT;  Laterality: N/A;  . LOOP RECORDER INSERTION N/A 06/20/2016   Procedure: Loop Recorder Insertion;  Surgeon: [name], MD;  Location: [place] [facility];  Service: Cardiovascular;  Laterality: N/A;  . REFRACTIVE SURGERY Bilateral    both eyes  . SPINE SURGERY     lumbar    Family History  Problem Relation Age of Onset  . Cancer Mother   . Heart disease Mother   . Diabetes Father   . Cancer Brother   . Cancer Brother   . Throat cancer Brother     Social History:  reports that she has been smoking Cigarettes.  She has a 30.00 pack-year smoking history. She has never used smokeless tobacco. She reports that she drinks alcohol. She reports that she does not use drugs.  Review of Systems:  HYPONATREMIA:  Her sodium is now 129, previously 131 She says she is drinking 6-8 cups of water along with coffee and diet  drink every day and she likes to drink water No recent problems with swelling of her legs She does have decreased appetite although no nausea and her weight is down slightly  Blindness: She has absent vision on the right side and can see silhouettes only on the left.    HYPERTENSION:  controlled with taking amlodipine 10 mg, HCTZ 12.5 mg and lisinopril 40 mg dose.    Renal function: Her creatinine is relatively better, sees Dr. Florene Glen   Lab Results  Component Value Date   CREATININE 1.46 (H) 09/13/2016   BUN 21 09/13/2016   NA 129 (L) 09/13/2016   K 4.8 09/13/2016   CL 103 09/13/2016   CO2 21 09/13/2016     HYPERLIPIDEMIA: The lipid abnormality consists of elevated LDL controlled with Lipitor 20 mg .  Her baseline LDL was 202 Her last LDL was 43 done in April 2018 by PCP  Lab Results  Component Value Date   CHOL 156 09/13/2015   HDL 82.20 09/13/2015   LDLCALC 49 09/13/2015   TRIG 125.0 09/13/2015   CHOLHDL 2 09/13/2015    Vitamin D: Taking 50,000 unit dosage once a month From her PCP Recent level is normal  Lab Results  Component Value Date   VD25OH 40.22 09/13/2016       Last diabetic foot exam was done in 4/18  No further syncopal episodes    Examination:   BP 136/60 (Cuff Size: Normal)   Pulse 92  Ht 5' 3.25" (1.607 m)   Wt 145 lb 12.8 oz (66.1 kg)   SpO2 98%   BMI 25.62 kg/m   Body mass index is 25.62 kg/m.    ASSESSMENT/ PLAN:   Diabetes type 2  See history of present illness for detailed discussion of current management, blood sugar patterns and problems identified  Her blood sugars are overall fairly well controlled with A1c 6.6, previously 7% Fasting glucose in the lab and postprandial after breakfast today is fairly good No reported hypoglycemia  She does appear to be benefiting from metformin 750 mg which can be continued since her GFR is improved She is doing very well with taking her insulin as directed on time  HYPONATREMIA: Her  sodium is now 129 which is lower than usual Most likely this is related to HCTZ and also her liberal fluid intake She does have somewhat decreased appetite and weight loss and not clear this is related  Discussed cutting back on her fluid intake at least to 4 cups of water and other liquids Empirically will try her on indapamide instead of HCTZ to see if her sodium will be relatively better This may also work better for her hypertension with her renal dysfunction Will also check her thyroid levels on the next visit  She is going to follow-up with nephrologist in October and is can be rechecked  HYPERTENSION:  Fairly well controlled  Renal dysfunction: She will continue follow-up with nephrologist, recently creatinine better    Patient Instructions  Reduce fluids with only 4 cups water  Change HCTZ     Lamiya Naas 09/19/2016, 2:00 PM   Total visit time for evaluation and management of multiple problems, counseling, review of labs = 25 minutes  Note: This office note was prepared with Estate agent. Any transcriptional errors that result from this process are unintentional.

## 2016-09-19 NOTE — Patient Instructions (Addendum)
Reduce fluids with only 4 cups water  Change HCTZ

## 2016-09-19 NOTE — Progress Notes (Signed)
[facility] Summary Report / Loop Recorder 

## 2016-09-23 LAB — CUP PACEART REMOTE DEVICE CHECK
Implantable Pulse Generator Implant Date: 20180523
MDC IDC SESS DTM: 20180821220756

## 2016-09-24 ENCOUNTER — Encounter: Payer: Self-pay | Admitting: Family

## 2016-09-24 ENCOUNTER — Ambulatory Visit (INDEPENDENT_AMBULATORY_CARE_PROVIDER_SITE_OTHER): Payer: Medicare HMO | Admitting: Family

## 2016-09-24 ENCOUNTER — Ambulatory Visit (HOSPITAL_COMMUNITY)
Admission: RE | Admit: 2016-09-24 | Discharge: 2016-09-24 | Disposition: A | Discharge status: Home / Self Care | Payer: Medicare HMO | Source: Ambulatory Visit | Attending: Family | Admitting: Family

## 2016-09-24 VITALS — BP 136/68 | HR 93 | Temp 98.5°F | Resp 16 | Ht 63.25 in | Wt 143.0 lb

## 2016-09-24 DIAGNOSIS — I70212 Atherosclerosis of native arteries of extremities with intermittent claudication, left leg: Secondary | ICD-10-CM

## 2016-09-24 DIAGNOSIS — E1151 Type 2 diabetes mellitus with diabetic peripheral angiopathy without gangrene: Secondary | ICD-10-CM

## 2016-09-24 DIAGNOSIS — I70219 Atherosclerosis of native arteries of extremities with intermittent claudication, unspecified extremity: Secondary | ICD-10-CM

## 2016-09-24 DIAGNOSIS — R69 Illness, unspecified: Secondary | ICD-10-CM | POA: Diagnosis not present

## 2016-09-24 DIAGNOSIS — I739 Peripheral vascular disease, unspecified: Secondary | ICD-10-CM | POA: Diagnosis not present

## 2016-09-24 DIAGNOSIS — F17200 Nicotine dependence, unspecified, uncomplicated: Secondary | ICD-10-CM

## 2016-09-24 NOTE — Patient Instructions (Signed)
Steps to Quit Smoking Smoking tobacco can be bad for your health. It can also affect almost every organ in your body. Smoking puts you and people around you at risk for many serious long-lasting (chronic) diseases. Quitting smoking is hard, but it is one of the best things that you can do for your health. It is never too late to quit. What are the benefits of quitting smoking? When you quit smoking, you lower your risk for getting serious diseases and conditions. They can include:  Lung cancer or lung disease.  Heart disease.  Stroke.  Heart attack.  Not being able to have children (infertility).  Weak bones (osteoporosis) and broken bones (fractures).  If you have coughing, wheezing, and shortness of breath, those symptoms may get better when you quit. You may also get sick less often. If you are pregnant, quitting smoking can help to lower your chances of having a baby of low birth weight. What can I do to help me quit smoking? Talk with your doctor about what can help you quit smoking. Some things you can do (strategies) include:  Quitting smoking totally, instead of slowly cutting back how much you smoke over a period of time.  Going to in-person counseling. You are more likely to quit if you go to many counseling sessions.  Using resources and support systems, such as: ? Online chats with a counselor. ? Phone quitlines. ? Printed self-help materials. ? Support groups or group counseling. ? Text messaging programs. ? Mobile phone apps or applications.  Taking medicines. Some of these medicines may have nicotine in them. If you are pregnant or breastfeeding, do not take any medicines to quit smoking unless your doctor says it is okay. Talk with your doctor about counseling or other things that can help you.  Talk with your doctor about using more than one strategy at the same time, such as taking medicines while you are also going to in-person counseling. This can help make  quitting easier. What things can I do to make it easier to quit? Quitting smoking might feel very hard at first, but there is a lot that you can do to make it easier. Take these steps:  Talk to your family and friends. Ask them to support and encourage you.  Call phone quitlines, reach out to support groups, or work with a counselor.  Ask people who smoke to not smoke around you.  Avoid places that make you want (trigger) to smoke, such as: ? Bars. ? Parties. ? Smoke-break areas at work.  Spend time with people who do not smoke.  Lower the stress in your life. Stress can make you want to smoke. Try these things to help your stress: ? Getting regular exercise. ? Deep-breathing exercises. ? Yoga. ? Meditating. ? Doing a body scan. To do this, close your eyes, focus on one area of your body at a time from head to toe, and notice which parts of your body are tense. Try to relax the muscles in those areas.  Download or buy apps on your mobile phone or tablet that can help you stick to your quit plan. There are many free apps, such as QuitGuide from the CDC (Centers for Disease Control and Prevention). You can find more support from smokefree.gov and other websites.  This information is not intended to replace advice given to you by your health care provider. Make sure you discuss any questions you have with your health care provider. Document Released: 11/11/2008 Document   Revised: 09/13/2015 Document Reviewed: 06/01/2014 Elsevier Interactive Patient Education  2018 Elsevier Inc.     Peripheral Vascular Disease Peripheral vascular disease (PVD) is a disease of the blood vessels that are not part of your heart and brain. A simple term for PVD is poor circulation. In most cases, PVD narrows the blood vessels that carry blood from your heart to the rest of your body. This can result in a decreased supply of blood to your arms, legs, and internal organs, like your stomach or kidneys.  However, it most often affects a person's lower legs and feet. There are two types of PVD.  Organic PVD. This is the more common type. It is caused by damage to the structure of blood vessels.  Functional PVD. This is caused by conditions that make blood vessels contract and tighten (spasm).  Without treatment, PVD tends to get worse over time. PVD can also lead to acute ischemic limb. This is when an arm or limb suddenly has trouble getting enough blood. This is a medical emergency. Follow these instructions at home:  Take medicines only as told by your doctor.  Do not use any tobacco products, including cigarettes, chewing tobacco, or electronic cigarettes. If you need help quitting, ask your doctor.  Lose weight if you are overweight, and maintain a healthy weight as told by your doctor.  Eat a diet that is low in fat and cholesterol. If you need help, ask your doctor.  Exercise regularly. Ask your doctor for some good activities for you.  Take good care of your feet. ? Wear comfortable shoes that fit well. ? Check your feet often for any cuts or sores. Contact a doctor if:  You have cramps in your legs while walking.  You have leg pain when you are at rest.  You have coldness in a leg or foot.  Your skin changes.  You are unable to get or have an erection (erectile dysfunction).  You have cuts or sores on your feet that are not healing. Get help right away if:  Your arm or leg turns cold and blue.  Your arms or legs become red, warm, swollen, painful, or numb.  You have chest pain or trouble breathing.  You suddenly have weakness in your face, arm, or leg.  You become very confused or you cannot speak.  You suddenly have a very bad headache.  You suddenly cannot see. This information is not intended to replace advice given to you by your health care provider. Make sure you discuss any questions you have with your health care provider. Document Released:  04/11/2009 Document Revised: 06/23/2015 Document Reviewed: 06/25/2013 Elsevier Interactive Patient Education  2017 Elsevier Inc.  

## 2016-09-24 NOTE — Progress Notes (Signed)
[facility] [place]   CC: Follow up peripheral artery occlusive disease  History of Present Illness Julie Brewer is a 66 y.o. female patient of Dr. Trula Slade.  The patient is back today for followup of her peripheral vascular disease. When Dr. Trula Slade met her in 2011 she was having claudication in her left leg at approximately one block. We treated her with cilostazol, and she did much better.  She no longer has left calf heavy feeling after walking 10 minutes, no more claudication, denies non healing wounds.  She states she had a TIA in about 2010 as manifested by slurred speech that resolved, denies hemiparesis, denies further loss of vision. She states that her loss of vision in both eyes is due to glaucoma.  She continues to be medically managed for hypertension and hyperlipidemia as well as her diabetes. She is on a statin as well as an ACE inhibitor.   Pt Diabetic: Yes, states her last A1C was 6.6 Pt smoker: smoker (1 ppd, started at age 74 yrs)  Pt meds include: Statin :Yes ASA: Yes Other anticoagulants/antiplatelets: Pletal     Past Medical History:  Diagnosis Date  . Asthma    No probnlems recently  . 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 Diabetes mellitus    Type II per Dr [name]  (patient said type I)  . Fibroid   . GERD (gastroesophageal reflux disease)   . Glaucoma   . Hyperlipidemia   . Hypertension   . Iron deficiency anemia 03/09/2016  . Peripheral vascular disease ([place])   . Pneumonia 2006  . Shortness of breath dyspnea    with exdrtion, "Walkling too fast"  . Stroke [facility])    no residual    Social History Social History  Substance Use Topics  . Smoking status: Current Every Day Smoker    Packs/day: 1.00    Years: 30.00    Types: Cigarettes  . Smokeless tobacco: Never Used  . Alcohol use Yes     Comment: socially    Family History Family History  Problem  Relation Age of Onset  . Cancer Mother   . Heart disease Mother   . Diabetes Father   . Cancer Brother   . Cancer Brother   . Throat cancer Brother     Past Surgical History:  Procedure Laterality Date  . ABDOMINAL HYSTERECTOMY    . CERVICAL FUSION     with graft from hip  . COLONOSCOPY  July 09, 2012  . DIRECT LARYNGOSCOPY N/A 06/07/2014   Procedure: DIRECT LARYNGOSCOPY with BIOPSY and EXCISION VOLLECULAR CYST;  Surgeon: Ruby Cola, MD;  Location: [facility];  Service: ENT;  Laterality: N/A;  . LOOP RECORDER INSERTION N/A 06/20/2016   Procedure: Loop Recorder Insertion;  Surgeon: [name], MD;  Location: [facility];  Service: Cardiovascular;  Laterality: N/A;  . REFRACTIVE SURGERY Bilateral    both eyes  . SPINE SURGERY     lumbar    Allergies  Allergen Reactions  . Morphine And Related Other (See Comments)    Hallucenations   . Penicillins Rash and Other (See Comments)    Swelling    Current Outpatient Prescriptions  Medication Sig Dispense Refill  . amLODipine (NORVASC) 10 MG tablet Take 10 mg by mouth daily.     Marland Kitchen aspirin 325 MG EC tablet Take 325 mg by mouth daily.    Marland Kitchen atorvastatin (LIPITOR)  20 MG tablet TAKE ONE TABLET BY MOUTH DAILY 90 tablet 3  . cilostazol (PLETAL) 100 MG tablet TAKE ONE TABLET BY MOUTH TWICE DAILY 60 tablet 6  . doxycycline (MONODOX) 100 MG capsule Take 1 capsule by mouth 2 (two) times daily.    Marland Kitchen gabapentin (NEURONTIN) 100 MG capsule TAKE ONE CAPSULE BY MOUTH TWICE DAILY 180 capsule 1  . glucose blood (FREESTYLE LITE) test strip Use as instructed to check blood sugar 2 times a day dx code E11.9 200 each 1  . indapamide (LOZOL) 1.25 MG tablet Take 1 tablet (1.25 mg total) by mouth daily. 30 tablet 3  . insulin detemir (LEVEMIR) 100 UNIT/ML injection Inject 20 Units into the skin every morning.    . insulin regular (NOVOLIN R,HUMULIN R) 100 units/mL injection Inject 5-10 Units into the skin See admin instructions. 10 units in the  morning and 5-7 units in the evening.    . IRON PO Take 1 tablet by mouth daily.     Marland Kitchen lisinopril (PRINIVIL,ZESTRIL) 40 MG tablet Take 40 mg by mouth daily.     . metFORMIN (GLUCOPHAGE-XR) 750 MG 24 hr tablet TAKE 1 TABLET BY MOUTH ONCE DAILY 90 tablet 0  . omeprazole (PRILOSEC) 20 MG capsule TAKE ONE CAPSULE BY MOUTH ONCE DAILY (DUE FOR OFFICE VISIT THIS MONTH). 90 capsule 1  . Vitamin D, Ergocalciferol, (DRISDOL) 50000 units CAPS capsule TAKE ONE CAPSULE BY MOUTH EVERY 7 DAYS (Patient taking differently: TAKE ONE CAPSULE BY MOUTH  ONCE  A MONTH) 12 capsule 0   No current facility-administered medications for this visit.     ROS: See HPI for pertinent positives and negatives.   Physical Examination  Vitals:   09/24/16 1438 09/24/16 1440  BP: 131/66 136/68  Pulse: 93   Resp: 16   Temp: 98.5 F (36.9 C)   TempSrc: Oral   SpO2: 99%   Weight: 143 lb (64.9 kg)   Height: 5' 3.25" (1.607 m)    Body mass index is 25.13 kg/m.  General: A&O x 3, WDWN. Gait: normal, using cane for the blind Eyes: Pupils are unequal and non reactive to light; right eye surface is clouded. Pulmonary: Respirations are non labored, slightly decreased air movement, without wheezes, rales, or rhonchi. Cardiac: regular rhythm, no detected murmur.     Carotid Bruits Right Left   Negative Negative   Abdominal aortic pulse is not palpable. Radial pulses: are 2+ palpable and =.   VASCULAR EXAM: Extremitieswithout ischemic changes  without Gangrene; without open wounds.     LE Pulses Right Left   FEMORAL 2+ palpable 1+ palpable    POPLITEAL not palpable  not palpable   POSTERIOR TIBIAL 2+ palpable  not palpable    DORSALIS PEDIS  ANTERIOR TIBIAL 2+ palpable  faintly palpable     Abdomen: soft, NT, no palpable masses. Skin: no rashes, no ulcers. Musculoskeletal: no muscle wasting or atrophy. Neurologic: A&O X 3; Appropriate Affect, MOTOR FUNCTION: moving all extremities equally, motor strength 5/5 in UE's, 4/5 in LE's. Speech is fluent/normal. CN 2-12 intact except for loss of vision.     ASSESSMENT: Julie Brewer is a 66 y.o. female who has a history of mild left calf claudication, but currently denies claudication symptoms. She climbs and descends stairs several times/day, may not be walking much due to her inability to see, is blind. She therefore may not walk enough to elicit claudication symptoms.   Her atherosclerotic risk factors include currently controlled DM and active  smoking. She states that her DM has come under control since she has been under the care of Dr. [name], endocrinologist. She states her DM was uncontrolled for a long time prior to this.  She does not walk much since she is almost blind. See Plan.  She states that Pletal likely helps prevent her claudication.   DATA  ABI (Date: 09/24/2016)  R:   ABI: 1.04 (was 1.02 on 09-12-15),   PT: tri  DP: tri  TBI:  0.80  L:   ABI: 0.73 (was 0.72),   PT: bi  DP: bi  TBI: 0.64  Bilateral ABI's remains stable: right is normal with triphasic waveforms, left with moderate arterial occlusive disease.   PLAN:  The patient was counseled re smoking cessation and given several free resources re smoking cessation.   Daily seated leg exercises demonstrated and discussed. Based on the patient's vascular studies and examination, pt will return to clinic in 1 year with ABI's.   I advised her and pt to notify us if she develops concerns re the circulation in her feet/legs.  I discussed in depth with the patient the nature of atherosclerosis, and emphasized the importance of maximal medical management including strict control of blood pressure, blood glucose, and lipid  levels, obtaining regular exercise, and cessation of smoking.  The patient is aware that without maximal medical management the underlying atherosclerotic disease process will progress, limiting the benefit of any interventions.  The patient was given information about PAD including signs, symptoms, treatment, what symptoms should prompt the patient to seek immediate medical care, and risk reduction measures to take.  Clemon Chambers, RN, MSN, FNP-C [facility] Phone: [phone]  Clinic MD: Trula Slade  09/24/16 3:15 PM

## 2016-10-03 NOTE — Addendum Note (Signed)
Addended by: Lianne Cure A on: 10/03/2016 09:28 AM   Modules accepted: Orders

## 2016-10-18 ENCOUNTER — Ambulatory Visit (INDEPENDENT_AMBULATORY_CARE_PROVIDER_SITE_OTHER): Payer: Medicare HMO | Admitting: *Deleted

## 2016-10-18 ENCOUNTER — Telehealth: Payer: Self-pay | Admitting: Oncology

## 2016-10-18 ENCOUNTER — Ambulatory Visit (HOSPITAL_BASED_OUTPATIENT_CLINIC_OR_DEPARTMENT_OTHER): Payer: Medicare HMO | Admitting: Nurse Practitioner

## 2016-10-18 ENCOUNTER — Other Ambulatory Visit (HOSPITAL_BASED_OUTPATIENT_CLINIC_OR_DEPARTMENT_OTHER): Payer: Medicare HMO

## 2016-10-18 VITALS — BP 158/56 | HR 103 | Temp 98.2°F | Resp 18 | Ht 63.25 in | Wt 144.6 lb

## 2016-10-18 DIAGNOSIS — I10 Essential (primary) hypertension: Secondary | ICD-10-CM

## 2016-10-18 DIAGNOSIS — D509 Iron deficiency anemia, unspecified: Secondary | ICD-10-CM

## 2016-10-18 DIAGNOSIS — D560 Alpha thalassemia: Secondary | ICD-10-CM

## 2016-10-18 DIAGNOSIS — N189 Chronic kidney disease, unspecified: Secondary | ICD-10-CM

## 2016-10-18 DIAGNOSIS — R55 Syncope and collapse: Secondary | ICD-10-CM | POA: Diagnosis not present

## 2016-10-18 DIAGNOSIS — E119 Type 2 diabetes mellitus without complications: Secondary | ICD-10-CM | POA: Diagnosis not present

## 2016-10-18 LAB — CBC WITH DIFFERENTIAL/PLATELET
BASO%: 0.7 % (ref 0.0–2.0)
Basophils Absolute: 0.1 10*3/uL (ref 0.0–0.1)
EOS%: 3.5 % (ref 0.0–7.0)
Eosinophils Absolute: 0.4 10*3/uL (ref 0.0–0.5)
HEMATOCRIT: 32.8 % — AB (ref 34.8–46.6)
HEMOGLOBIN: 10.7 g/dL — AB (ref 11.6–15.9)
LYMPH#: 1.3 10*3/uL (ref 0.9–3.3)
LYMPH%: 12.1 % — ABNORMAL LOW (ref 14.0–49.7)
MCH: 25.7 pg (ref 25.1–34.0)
MCHC: 32.4 g/dL (ref 31.5–36.0)
MCV: 79.2 fL — ABNORMAL LOW (ref 79.5–101.0)
MONO#: 1 10*3/uL — ABNORMAL HIGH (ref 0.1–0.9)
MONO%: 9.3 % (ref 0.0–14.0)
NEUT#: 8.2 10*3/uL — ABNORMAL HIGH (ref 1.5–6.5)
NEUT%: 74.4 % (ref 38.4–76.8)
Platelets: 279 10*3/uL (ref 145–400)
RBC: 4.15 10*6/uL (ref 3.70–5.45)
RDW: 16.5 % — ABNORMAL HIGH (ref 11.2–14.5)
WBC: 10.9 10*3/uL — ABNORMAL HIGH (ref 3.9–10.3)

## 2016-10-18 NOTE — Telephone Encounter (Signed)
Gave avs and calendar for December  °

## 2016-10-18 NOTE — Progress Notes (Addendum)
  Julie Brewer OFFICE PROGRESS NOTE   Diagnosis:  Anemia  INTERVAL HISTORY:   Julie Brewer returns as scheduled. She denies bleeding. She continues iron once a day. She notes mild constipation with iron.  Objective:  Vital signs in last 24 hours:  Blood pressure (!) 158/56, pulse (!) 103, temperature 98.2 F (36.8 C), temperature source Oral, resp. rate 18, height 5' 3.25" (1.607 m), weight 144 lb 9.6 oz (65.6 kg), SpO2 97 %.    Resp: Lungs clear bilaterally. Cardio: Regular rate and rhythm. GI: Abdomen soft and nontender. No hepatosplenomegaly. Vascular: No leg edema.    Lab Results:  Lab Results  Component Value Date   WBC 10.9 (H) 10/18/2016   HGB 10.7 (L) 10/18/2016   HCT 32.8 (L) 10/18/2016   MCV 79.2 (L) 10/18/2016   PLT 279 10/18/2016   NEUTROABS 8.2 (H) 10/18/2016    Imaging:  No results found.  Medications: I have reviewed the patient's current medications.  Assessment/Plan: 1. Mild anemia ? Low ferritin (6.9) 01/09/2016 and (9.7) 02/28/2016 ? Ferritin normal (104) 03/09/2016 ? Negative stool Hemoccult 03/09/2016 (she is unable to complete stool cards due to blindness) ? Ferritin normal (86) 05/18/2016 ? 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 Chronic renal failure 4. Hypertension 5. Diabetes 6. Glaucoma with blindness 7. Peripheral neuropathy 8. 07/09/2012 colonoscopy-2 sessile polyps found in the rectum, removed (hyperplastic polyp, no adenomatous change or malignancy)   Disposition: Julie Brewer has a persistent mild microcytic anemia. She understands the alpha thalassemia trait does not explain the anemia or low MCV. We discussed other potential etiologies including iron deficiency, anemia related to renal failure, anemia of chronic disease. She will increase oral iron to twice daily. She will return for a follow-up visit, CBC and iron studies in 3 months. She will  contact the office in the interim with any problems.  Patient seen with Dr. Benay Spice.     Ned Card ANP/GNP-BC   10/18/2016  11:57 AM This was a shared visit with Ned Card. We discussed the alpha globin gene mapping with the patient and her daughter. She has alpha thalassemia trait. This most likely does not explain the low MCV or anemia. She most likely has iron deficiency. We increase the iron dose. She will return for a CBC in 3 months.  Julie Brewer, M.D.

## 2016-10-19 LAB — CUP PACEART REMOTE DEVICE CHECK
Date Time Interrogation Session: 20180920220731
MDC IDC PG IMPLANT DT: 20180523

## 2016-10-19 NOTE — Progress Notes (Signed)
[facility] Summary Report / Loop Recorder 

## 2016-10-27 ENCOUNTER — Other Ambulatory Visit: Payer: Self-pay | Admitting: Endocrinology

## 2016-11-01 ENCOUNTER — Other Ambulatory Visit: Payer: Self-pay | Admitting: Endocrinology

## 2016-11-02 ENCOUNTER — Telehealth: Payer: Self-pay | Admitting: Endocrinology

## 2016-11-02 ENCOUNTER — Other Ambulatory Visit: Payer: Self-pay

## 2016-11-02 DIAGNOSIS — Z23 Encounter for immunization: Secondary | ICD-10-CM | POA: Diagnosis not present

## 2016-11-02 MED ORDER — DOXYCYCLINE MONOHYDRATE 100 MG PO CAPS: 100.0000 mg | ORAL_CAPSULE | Freq: Two times a day (BID) | ORAL | 2 refills | Status: DC

## 2016-11-02 NOTE — Telephone Encounter (Signed)
This has been filled 

## 2016-11-02 NOTE — Telephone Encounter (Signed)
MEDICATION: doxycycline (MONODOX) 100 MG capsule GASTRIC REFLUX MEDICINE?  PHARMACY:   [place], [place] - [address] [phone] (Phone) [phone] (Fax)   IS THIS A 90 DAY SUPPLY : yes  IS PATIENT OUT OF MEDICATION: yes

## 2016-11-07 ENCOUNTER — Other Ambulatory Visit: Payer: Self-pay | Admitting: Endocrinology

## 2016-11-14 DIAGNOSIS — Z720 Tobacco use: Secondary | ICD-10-CM | POA: Diagnosis not present

## 2016-11-14 DIAGNOSIS — E1129 Type 2 diabetes mellitus with other diabetic kidney complication: Secondary | ICD-10-CM | POA: Diagnosis not present

## 2016-11-14 DIAGNOSIS — N183 Chronic kidney disease, stage 3 (moderate): Secondary | ICD-10-CM | POA: Diagnosis not present

## 2016-11-19 ENCOUNTER — Ambulatory Visit (INDEPENDENT_AMBULATORY_CARE_PROVIDER_SITE_OTHER): Payer: Medicare HMO | Admitting: *Deleted

## 2016-11-19 DIAGNOSIS — R55 Syncope and collapse: Secondary | ICD-10-CM | POA: Diagnosis not present

## 2016-11-19 NOTE — Progress Notes (Signed)
[facility] Summary Report / Loop Recorder 

## 2016-11-20 ENCOUNTER — Ambulatory Visit (INDEPENDENT_AMBULATORY_CARE_PROVIDER_SITE_OTHER): Payer: Medicare HMO | Admitting: Podiatry

## 2016-11-20 ENCOUNTER — Encounter: Payer: Self-pay | Admitting: Podiatry

## 2016-11-20 DIAGNOSIS — E1151 Type 2 diabetes mellitus with diabetic peripheral angiopathy without gangrene: Secondary | ICD-10-CM | POA: Diagnosis not present

## 2016-11-20 DIAGNOSIS — Q828 Other specified congenital malformations of skin: Secondary | ICD-10-CM | POA: Diagnosis not present

## 2016-11-20 DIAGNOSIS — B351 Tinea unguium: Secondary | ICD-10-CM

## 2016-11-20 DIAGNOSIS — M79676 Pain in unspecified toe(s): Secondary | ICD-10-CM

## 2016-11-20 DIAGNOSIS — I739 Peripheral vascular disease, unspecified: Secondary | ICD-10-CM | POA: Diagnosis not present

## 2016-11-20 NOTE — Progress Notes (Signed)
Patient ID: Julie Brewer, female   DOB: April 18, 1950, 65 y.o.   MRN: [id number]    Subjective: This patient presents again for a scheduled visit complaining of thickened and elongated toenails which are uncomfortable walking wearing shoes as well as a painful plantar callus on the right foot. She presents for debridement of these nails and painful plantar callus The patient's daughter is present in the treatment room today  Objective:  DP pulse right 1/4 and DP left 0/4 bilaterally PT pulses 2/4 right and 0/4 left Capillary reflex within normal limits right delayed left Sensation to 10 g monofilament wire intact 4/5 bilaterally Vibratory sensation reactive bilaterally Ankle reflexes reactive bilaterally Hammertoe second bilaterally HAV left No open skin lesions bilaterally Atrophic skin with absent hair growth bilaterally The toenails are hypertrophic, elongated, discolored, deformed and tender direct palpation 6-10 Plantar nucleated keratoses subsecond and fifth MPJ right  Assessment: Symptomatic onychomycoses 6-10 Porokeratosis 2 Diabetic with peripheral arterial disease  Plan: Debridement toenails 10 and mechanically and electrically without any bleeding Debride plantar keratoses 2without any bleeding  Reappoint 3 months

## 2016-11-20 NOTE — Patient Instructions (Signed)

## 2016-11-22 LAB — CUP PACEART REMOTE DEVICE CHECK
MDC IDC PG IMPLANT DT: 20180523
MDC IDC SESS DTM: 20181020223941

## 2016-12-14 ENCOUNTER — Ambulatory Visit: Payer: Medicare HMO | Admitting: Cardiovascular Disease

## 2016-12-14 ENCOUNTER — Encounter: Payer: Self-pay | Admitting: Cardiovascular Disease

## 2016-12-14 VITALS — BP 128/50 | HR 97 | Ht 63.25 in | Wt 142.8 lb

## 2016-12-14 DIAGNOSIS — I10 Essential (primary) hypertension: Secondary | ICD-10-CM | POA: Diagnosis not present

## 2016-12-14 DIAGNOSIS — R55 Syncope and collapse: Secondary | ICD-10-CM

## 2016-12-14 DIAGNOSIS — E785 Hyperlipidemia, unspecified: Secondary | ICD-10-CM

## 2016-12-14 NOTE — Assessment & Plan Note (Signed)
History of dyslipidemia on statin therapy followed by her PCP 

## 2016-12-14 NOTE — Assessment & Plan Note (Signed)
History of essential hypertension blood pressure measured at 129/50. She is on amlodipine and lisinopril. Continue current meds at current dosing.

## 2016-12-14 NOTE — Assessment & Plan Note (Signed)
History of syncope in the past status post loop recorder implantation by Dr. Sallyanne Kuster 06/20/16. She has had no episodes of syncope since implantation.

## 2016-12-14 NOTE — Progress Notes (Signed)
12/14/2016 Julie Brewer   03/25/1950  [id number]  Primary Physician [name], MD Primary Cardiologist: Lorretta Harp MD [name], [place]  HPI:  Julie Brewer is a 66 y.o.  moderately overweight separated African-American female mother of one child, grandmother 36 grandchildren referred by her PCP, [name], for evaluation of a recent episode of syncope. She is accompanied by her daughter [name] today. Risk factors include 50-pack-years of tobacco abuse continued to smoke one pack per day. She has history of hypertension, diabetes and hyperlipidemia. She has never had a heart attack but does have a history of "TIAs. She denies chest pain but does get short of breath. She has peripheral arterial disease and is followed by Dr. Trula Slade for claudication which he is minimally symptomatic from. Approximately one week ago she was witnessed to have lost consciousness while sitting in a chair for 45 minutes. There was no loss of bowel or bladder control or tonic clonic activity. We do not know her serum glucose level at that time. She did have some nausea and vomiting after she woke up. She was seen by Kerin Ransom on 4/60/18 which was several weeks after a recurrent episode of syncope and also describes an episode of syncope several weeks ago after eating dinner. Her husband was there. She was noted to be diaphoretic. There are no other symptoms. A 2-D echo was performed 05/15/16 which was entirely normal. She had a loop recorder implanted by Dr. Sallyanne Kuster 06/20/16. She's had no further episodes of syncope since that time.     Current Meds  Medication Sig  . amLODipine (NORVASC) 10 MG tablet Take 10 mg by mouth daily.   Marland Kitchen aspirin 325 MG EC tablet Take 325 mg by mouth daily.  Marland Kitchen atorvastatin (LIPITOR) 20 MG tablet TAKE ONE TABLET BY MOUTH DAILY  . cilostazol (PLETAL) 100 MG tablet TAKE ONE TABLET BY MOUTH TWICE DAILY  . doxycycline (MONODOX) 100 MG capsule Take 1 capsule  (100 mg total) by mouth 2 (two) times daily.  Marland Kitchen gabapentin (NEURONTIN) 100 MG capsule TAKE ONE CAPSULE BY MOUTH TWICE DAILY  . glucose blood (FREESTYLE LITE) test strip Use as instructed to check blood sugar 2 times a day dx code E11.9  . indapamide (LOZOL) 1.25 MG tablet Take 1 tablet (1.25 mg total) by mouth daily.  . insulin detemir (LEVEMIR) 100 UNIT/ML injection Inject 20 Units into the skin every morning.  . insulin regular (NOVOLIN R,HUMULIN R) 100 units/mL injection Inject 5-10 Units into the skin See admin instructions. 10 units in the morning and 5-7 units in the evening.  . IRON PO Take 1 tablet by mouth daily.   Marland Kitchen lisinopril (PRINIVIL,ZESTRIL) 40 MG tablet Take 40 mg by mouth daily.   . metFORMIN (GLUCOPHAGE-XR) 750 MG 24 hr tablet TAKE 1 TABLET BY MOUTH ONCE DAILY  . omeprazole (PRILOSEC) 20 MG capsule TAKE ONE CAPSULE BY MOUTH ONCE DAILY (DUE FOR OFFICE VISIT THIS MONTH).  . Vitamin D, Ergocalciferol, (DRISDOL) 50000 units CAPS capsule TAKE ONE CAPSULE BY MOUTH EVERY 7 DAYS (Patient taking differently: TAKE ONE CAPSULE BY MOUTH  ONCE  A MONTH)     Allergies  Allergen Reactions  . Morphine And Related Other (See Comments)    Hallucenations   . Penicillins Rash and Other (See Comments)    Swelling    Social History   Socioeconomic History  . Marital status: Legally Separated    Spouse name: Not on file  . Number  of children: Not on file  . Years of education: Not on file  . Highest education level: Not on file  Social Needs  . Financial resource strain: Not on file  . Food insecurity - worry: Not on file  . Food insecurity - inability: Not on file  . Transportation needs - medical: Not on file  . Transportation needs - non-medical: Not on file  Occupational History  . Not on file  Tobacco Use  . Smoking status: Current Every Day Smoker    Packs/day: 1.00    Years: 30.00    Pack years: 30.00    Types: Cigarettes  . Smokeless tobacco: Never Used  Substance and  Sexual Activity  . Alcohol use: Yes    Comment: socially  . Drug use: No  . Sexual activity: No    Birth control/protection: Post-menopausal, Surgical    Comment: Hysterectomy  Other Topics Concern  . Not on file  Social History Narrative  . Not on file     Review of Systems: General: negative for chills, fever, night sweats or weight changes.  Cardiovascular: negative for chest pain, dyspnea on exertion, edema, orthopnea, palpitations, paroxysmal nocturnal dyspnea or shortness of breath Dermatological: negative for rash Respiratory: negative for cough or wheezing Urologic: negative for hematuria Abdominal: negative for nausea, vomiting, diarrhea, bright red blood per rectum, melena, or hematemesis Neurologic: negative for visual changes, syncope, or dizziness All other systems reviewed and are otherwise negative except as noted above.    Blood pressure (!) 128/50, pulse 97, height 5' 3.25" (1.607 m), weight 142 lb 12.8 oz (64.8 kg), SpO2 94 %.  General appearance: alert and no distress Neck: no adenopathy, no carotid bruit, no JVD, supple, symmetrical, trachea midline and thyroid not enlarged, symmetric, no tenderness/mass/nodules Lungs: clear to auscultation bilaterally Heart: regular rate and rhythm, S1, S2 normal, no murmur, click, rub or gallop Extremities: extremities normal, atraumatic, no cyanosis or edema Pulses: 2+ and symmetric Skin: Skin color, texture, turgor normal. No rashes or lesions Neurologic: Alert and oriented X 3, normal strength and tone. Normal symmetric reflexes. Normal coordination and gait  EKG sinus rhythm at 97 with septal Q waves and nonspecific ST and T-wave changes.  Personally reviewed this EKG.  ASSESSMENT AND PLAN:   Dyslipidemia History of dyslipidemia on statin therapy followed by her PCP.  Essential hypertension History of essential hypertension blood pressure measured at 129/50. She is on amlodipine and lisinopril. Continue current  meds at current dosing.  Syncope and collapse History of syncope in the past status post loop recorder implantation by Dr. Sallyanne Kuster 06/20/16. She has had no episodes of syncope since implantation.      Lorretta Harp MD FACP,FACC,FAHA, [facility] 12/14/2016 10:09 AM

## 2016-12-14 NOTE — Patient Instructions (Signed)

## 2016-12-17 ENCOUNTER — Ambulatory Visit (INDEPENDENT_AMBULATORY_CARE_PROVIDER_SITE_OTHER): Payer: Medicare HMO | Admitting: *Deleted

## 2016-12-17 DIAGNOSIS — R55 Syncope and collapse: Secondary | ICD-10-CM | POA: Diagnosis not present

## 2016-12-18 NOTE — Progress Notes (Signed)
[facility] Summary Report / Loop Recorder 

## 2016-12-19 ENCOUNTER — Other Ambulatory Visit (INDEPENDENT_AMBULATORY_CARE_PROVIDER_SITE_OTHER): Payer: Medicare HMO

## 2016-12-19 DIAGNOSIS — E1165 Type 2 diabetes mellitus with hyperglycemia: Secondary | ICD-10-CM | POA: Diagnosis not present

## 2016-12-19 DIAGNOSIS — Z794 Long term (current) use of insulin: Secondary | ICD-10-CM | POA: Diagnosis not present

## 2016-12-19 DIAGNOSIS — E871 Hypo-osmolality and hyponatremia: Secondary | ICD-10-CM

## 2016-12-19 LAB — COMPREHENSIVE METABOLIC PANEL
ALT: 10 U/L (ref 0–35)
AST: 12 U/L (ref 0–37)
Albumin: 3.9 g/dL (ref 3.5–5.2)
Alkaline Phosphatase: 78 U/L (ref 39–117)
BUN: 28 mg/dL — AB (ref 6–23)
CALCIUM: 9.4 mg/dL (ref 8.4–10.5)
CHLORIDE: 104 meq/L (ref 96–112)
CO2: 20 meq/L (ref 19–32)
CREATININE: 1.51 mg/dL — AB (ref 0.40–1.20)
GFR: 44.59 mL/min — ABNORMAL LOW (ref >60.00)
GLUCOSE: 101 mg/dL — AB (ref 70–99)
Potassium: 4.6 mEq/L (ref 3.5–5.1)
SODIUM: 130 meq/L — AB (ref 135–145)
Total Bilirubin: 0.3 mg/dL (ref 0.2–1.2)
Total Protein: 6.5 g/dL (ref 6.0–8.3)

## 2016-12-19 LAB — HEMOGLOBIN A1C: Hgb A1c MFr Bld: 6.1 % (ref 4.6–6.5)

## 2016-12-19 LAB — TSH: TSH: 1.44 u[IU]/mL (ref 0.35–4.50)

## 2016-12-21 ENCOUNTER — Other Ambulatory Visit: Payer: Self-pay | Admitting: Endocrinology

## 2016-12-23 ENCOUNTER — Encounter: Payer: Self-pay | Admitting: Endocrinology

## 2016-12-23 NOTE — Progress Notes (Signed)
Patient ID: Julie Brewer, female   DOB: 1950-02-03, 66 y.o.   MRN: [id number]   Reason for Appointment: Follow-up  History of Present Illness   Diagnosis: Type 2 DIABETES MELITUS, date of diagnosis:  1985  Prior history: She has been on insulin since diagnosis and on Lantus previously Also at some point had been started on Glucophage several years ago also Because of insurance preference Lantus was changed to Levemir  She refuses to use analog rapid acting insulin because of cost and is using regular insulin for several years   Her blood sugars are generally well controlled and A1c usually under 7%  Recent history:   Insulin regimen: Levemir insulin 20 in the morning once daily. Regular insulin 30 minutes Before eating, 10 units a.m. and 5-7 ac supper  Oral hypoglycemic drugs: Metformin ER 750    Her A1c was higher with stopping metformin at 8% and this was restarted in 1/18; it is now down to 6.1 %  Current blood sugar patterns, management and problems:  She is unable to check her blood sugar at home  Lab glucose was 101 fasting  Her insulin dose has been continued unchanged over the last few visits  She is taking 10 units to cover her breakfast in the morning and is eating mostly cereal although sometimes eating eggs  She is has history of hypoglycemia unawareness and not clear if she may be getting hypoglycemia  She is trying to take her regular insulin as directed 30 minutes before eating and the Levemir in the morning at the same time  She will adjust her evening dose at suppertime, will take 2 more units if eating more carbohydrate  Side effects from medications: None      Glucometer:  FreeStyle     Meals:  usually 2 meals per day at 10 AM and 5 PM.    Mealtime protein sources:turkey, chicken.  Eating cereal for breakfast at times Avoiding sweet drinks Physical activity: exercise: Just walking within the house              Microalbumin has been  mildly increased   Wt Readings from Last 3 Encounters:  12/24/16 142 lb (64.4 kg)  12/14/16 142 lb 12.8 oz (64.8 kg)  10/18/16 144 lb 9.6 oz (65.6 kg)   Lab Results  Component Value Date   HGBA1C 6.1 12/19/2016   HGBA1C 6.6 (H) 09/13/2016   HGBA1C 7.0 (H) 06/15/2016   Lab Results  Component Value Date   MICROALBUR 43.7 (H) 01/27/2016   LDLCALC 49 09/13/2015   CREATININE 1.51 (H) 12/19/2016      No results found for: FRUCTOSAMINE     Lab on 12/19/2016  Component Date Value Ref Range Status  . TSH 12/19/2016 1.44  0.35 - 4.50 uIU/mL Final  . Sodium 12/19/2016 130* 135 - 145 mEq/L Final  . Potassium 12/19/2016 4.6  3.5 - 5.1 mEq/L Final  . Chloride 12/19/2016 104  96 - 112 mEq/L Final  . CO2 12/19/2016 20  19 - 32 mEq/L Final  . Glucose, Bld 12/19/2016 101* 70 - 99 mg/dL Final  . BUN 12/19/2016 28* 6 - 23 mg/dL Final  . Creatinine, Ser 12/19/2016 1.51* 0.40 - 1.20 mg/dL Final  . Total Bilirubin 12/19/2016 0.3  0.2 - 1.2 mg/dL Final  . Alkaline Phosphatase 12/19/2016 78  39 - 117 U/L Final  . AST 12/19/2016 12  0 - 37 U/L Final  . ALT 12/19/2016 10  0 - 35  U/L Final  . Total Protein 12/19/2016 6.5  6.0 - 8.3 g/dL Final  . Albumin 12/19/2016 3.9  3.5 - 5.2 g/dL Final  . Calcium 12/19/2016 9.4  8.4 - 10.5 mg/dL Final  . GFR 12/19/2016 44.59* >60.00 mL/min Final  . Hgb A1c MFr Bld 12/19/2016 6.1  4.6 - 6.5 % Final   Glycemic Control Guidelines for People with Diabetes:Non Diabetic:  <6%Goal of Therapy: <7%Additional Action Suggested:  >8%     Allergies as of 12/24/2016      Reactions   Morphine And Related Other (See Comments)   Hallucenations    Penicillins Rash, Other (See Comments)   Swelling      Medication List        Accurate as of 12/24/16 11:15 AM. Always use your most recent med list.          amLODipine 10 MG tablet Commonly known as:  NORVASC Take 10 mg by mouth daily.   aspirin 325 MG EC tablet Take 325 mg by mouth daily.   atorvastatin 20 MG  tablet Commonly known as:  LIPITOR TAKE ONE TABLET BY MOUTH DAILY   cilostazol 100 MG tablet Commonly known as:  PLETAL TAKE ONE TABLET BY MOUTH TWICE DAILY   gabapentin 100 MG capsule Commonly known as:  NEURONTIN Take 1 capsule (100 mg total) by mouth 2 (two) times daily.   glucose blood test strip Commonly known as:  FREESTYLE LITE Use as instructed to check blood sugar 2 times a day dx code E11.9   indapamide 1.25 MG tablet Commonly known as:  LOZOL Take 1 tablet (1.25 mg total) by mouth daily.   insulin regular 100 units/mL injection Commonly known as:  NOVOLIN R,HUMULIN R Inject 5-10 Units into the skin See admin instructions. 10 units in the morning and 5-7 units in the evening.   IRON PO Take 1 tablet by mouth daily.   LEVEMIR 100 UNIT/ML injection Generic drug:  insulin detemir Inject 20 Units into the skin every morning.   lisinopril 40 MG tablet Commonly known as:  PRINIVIL,ZESTRIL Take 40 mg by mouth daily.   metFORMIN 750 MG 24 hr tablet Commonly known as:  GLUCOPHAGE-XR TAKE 1 TABLET BY MOUTH ONCE DAILY   omeprazole 20 MG capsule Commonly known as:  PRILOSEC TAKE ONE CAPSULE BY MOUTH ONCE DAILY (DUE FOR OFFICE VISIT THIS MONTH).   Vitamin D (Ergocalciferol) 50000 units Caps capsule Commonly known as:  DRISDOL TAKE ONE CAPSULE BY MOUTH EVERY 7 DAYS       Allergies:  Allergies  Allergen Reactions  . Morphine And Related Other (See Comments)    Hallucenations   . Penicillins Rash and Other (See Comments)    Swelling    Past Medical History:  Diagnosis Date  . Asthma    No probnlems recently  . 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 Diabetes mellitus    Type II per Dr [name]  (patient said type I)  . Fibroid   . GERD (gastroesophageal reflux disease)   . Glaucoma   . Hyperlipidemia   . Hypertension   . Iron deficiency anemia 03/09/2016  . Peripheral vascular disease ([place])   . Pneumonia  2006  . Shortness of breath dyspnea    with exdrtion, "Walkling too fast"  . Stroke [facility])    no residual    Past Surgical History:  Procedure Laterality Date  . ABDOMINAL HYSTERECTOMY    .  CERVICAL FUSION     with graft from hip  . COLONOSCOPY  July 09, 2012  . DIRECT LARYNGOSCOPY N/A 06/07/2014   Procedure: DIRECT LARYNGOSCOPY with BIOPSY and EXCISION VOLLECULAR CYST;  Surgeon: Ruby Cola, MD;  Location: [facility];  Service: ENT;  Laterality: N/A;  . LOOP RECORDER INSERTION N/A 06/20/2016   Procedure: Loop Recorder Insertion;  Surgeon: [name], MD;  Location: [facility];  Service: Cardiovascular;  Laterality: N/A;  . REFRACTIVE SURGERY Bilateral    both eyes  . SPINE SURGERY     lumbar    Family History  Problem Relation Age of Onset  . Cancer Mother   . Heart disease Mother   . Diabetes Father   . Cancer Brother   . Cancer Brother   . Throat cancer Brother     Social History:  reports that she has been smoking cigarettes.  She has a 30.00 pack-year smoking history. she has never used smokeless tobacco. She reports that she drinks alcohol. She reports that she does not use drugs.  Review of Systems:  HYPONATREMIA:  Her sodium is now 130 and she was told to avoid excess fluid Also taking indapamide instead of HCTZ  Blindness: She has absent vision on the right side and can see silhouettes only on the left.    HYPERTENSION:  controlled with taking amlodipine 10 mg, indapamide 1.25 and lisinopril 40 mg dose.    Renal function: Her creatinine is stable   Lab Results  Component Value Date   CREATININE 1.51 (H) 12/19/2016   BUN 28 (H) 12/19/2016   NA 130 (L) 12/19/2016   K 4.6 12/19/2016   CL 104 12/19/2016   CO2 20 12/19/2016     HYPERLIPIDEMIA: The lipid abnormality consists of elevated LDL controlled with Lipitor 20 mg .  Her baseline LDL was 202 Her last LDL was 43 done in April 2018 by PCP  Lab Results  Component Value Date   CHOL 156  09/13/2015   HDL 82.20 09/13/2015   LDLCALC 49 09/13/2015   TRIG 125.0 09/13/2015   CHOLHDL 2 09/13/2015    Vitamin D: Taking 50,000 unit dosage once a month From her PCP Recent level is normal  Lab Results  Component Value Date   VD25OH 40.22 09/13/2016       Last diabetic foot exam was done in 4/18  Takes Gabapentin 2x daily with control of her leg pains    Examination:   BP 116/64   Pulse 94   Ht 5' 3.25" (1.607 m)   Wt 142 lb (64.4 kg)   SpO2 97%   BMI 24.96 kg/m   Body mass index is 24.96 kg/m.    ASSESSMENT/ PLAN:   Diabetes type 2  See history of present illness for detailed discussion of current management, blood sugar patterns and problems identified  Her blood sugars are overall fairly well controlled with A1c now 6.1 Although she is not monitoring at home blood sugar in the lab was 101 Discussed having a bedtime snack with protein which she is not doing She will continue to adjust her regular insulin based on the amount of carbohydrate she is having at breakfast and suppertime No reported hypoglycemia  She does appear to be benefiting from metformin 750 mg   HYPONATREMIA: Her sodium is now 130, was as low as 129 Most likely this is related to indapamide Not clear if she is having symptoms from this but she does have decreased appetite and occasional  nausea  PLAN: Will have her taken indapamide every other day since she has low normal blood pressure Also continue avoiding large amounts of fluid She is going to follow-up with nephrologist regularly also  HYPERTENSION:  Blood pressure is low normal, will reduce indapamide  NEUROPATHY: She said that she needs to take the gabapentin regularly otherwise she will have neuropathic leg pains and can continue, new prescription given  Recommended annual follow-up with PCP for general exams  Renal dysfunction: She will continue follow-up with nephrologist, recently creatinine stable    Patient  Instructions  Bedtime snack needs to have protein   INDAPAMIDE EVERY 2 DAYS ONLY ON EVEN DAYS     Julie Brewer 12/24/2016, 11:15 AM   Total visit time for evaluation and management of multiple problems, counseling, medication and lab review = 25 minutes   Note: This office note was prepared with Estate agent. Any transcriptional errors that result from this process are unintentional.

## 2016-12-24 ENCOUNTER — Ambulatory Visit: Payer: Medicare HMO | Admitting: Endocrinology

## 2016-12-24 ENCOUNTER — Encounter: Payer: Self-pay | Admitting: Endocrinology

## 2016-12-24 VITALS — BP 116/64 | HR 94 | Ht 63.25 in | Wt 142.0 lb

## 2016-12-24 DIAGNOSIS — E1142 Type 2 diabetes mellitus with diabetic polyneuropathy: Secondary | ICD-10-CM | POA: Diagnosis not present

## 2016-12-24 DIAGNOSIS — E1165 Type 2 diabetes mellitus with hyperglycemia: Secondary | ICD-10-CM

## 2016-12-24 DIAGNOSIS — Z794 Long term (current) use of insulin: Secondary | ICD-10-CM | POA: Diagnosis not present

## 2016-12-24 DIAGNOSIS — E871 Hypo-osmolality and hyponatremia: Secondary | ICD-10-CM

## 2016-12-24 DIAGNOSIS — E1129 Type 2 diabetes mellitus with other diabetic kidney complication: Secondary | ICD-10-CM

## 2016-12-24 DIAGNOSIS — R809 Proteinuria, unspecified: Secondary | ICD-10-CM | POA: Diagnosis not present

## 2016-12-24 MED ORDER — GABAPENTIN 100 MG PO CAPS: 100.0000 mg | ORAL_CAPSULE | Freq: Two times a day (BID) | ORAL | 1 refills | Status: DC

## 2016-12-24 NOTE — Patient Instructions (Addendum)
Bedtime snack needs to have protein   INDAPAMIDE EVERY 2 DAYS ONLY ON EVEN DAYS

## 2016-12-27 ENCOUNTER — Other Ambulatory Visit: Payer: Self-pay | Admitting: Endocrinology

## 2017-01-02 ENCOUNTER — Encounter: Payer: Medicare HMO | Admitting: Cardiovascular Disease

## 2017-01-03 LAB — CUP PACEART REMOTE DEVICE CHECK
Date Time Interrogation Session: 20181119230927
Implantable Pulse Generator Implant Date: 20180523

## 2017-01-10 ENCOUNTER — Encounter: Payer: Medicare HMO | Admitting: Cardiovascular Disease

## 2017-01-16 ENCOUNTER — Ambulatory Visit (INDEPENDENT_AMBULATORY_CARE_PROVIDER_SITE_OTHER): Payer: Medicare HMO | Admitting: *Deleted

## 2017-01-16 ENCOUNTER — Other Ambulatory Visit: Payer: Self-pay | Admitting: Nurse Practitioner

## 2017-01-16 ENCOUNTER — Other Ambulatory Visit: Payer: Self-pay | Admitting: Endocrinology

## 2017-01-16 DIAGNOSIS — D508 Other iron deficiency anemias: Secondary | ICD-10-CM

## 2017-01-16 DIAGNOSIS — R55 Syncope and collapse: Secondary | ICD-10-CM

## 2017-01-17 ENCOUNTER — Telehealth: Payer: Self-pay | Admitting: Endocrinology

## 2017-01-17 ENCOUNTER — Ambulatory Visit (HOSPITAL_BASED_OUTPATIENT_CLINIC_OR_DEPARTMENT_OTHER): Payer: Medicare HMO | Admitting: Oncology

## 2017-01-17 ENCOUNTER — Telehealth: Payer: Self-pay | Admitting: Oncology

## 2017-01-17 ENCOUNTER — Other Ambulatory Visit (HOSPITAL_BASED_OUTPATIENT_CLINIC_OR_DEPARTMENT_OTHER): Payer: Medicare HMO

## 2017-01-17 ENCOUNTER — Encounter: Payer: Self-pay | Admitting: Oncology

## 2017-01-17 ENCOUNTER — Telehealth: Payer: Self-pay | Admitting: *Deleted

## 2017-01-17 VITALS — BP 151/59 | HR 93 | Temp 98.2°F | Resp 20 | Ht 63.25 in | Wt 139.9 lb

## 2017-01-17 DIAGNOSIS — R718 Other abnormality of red blood cells: Secondary | ICD-10-CM

## 2017-01-17 DIAGNOSIS — H409 Unspecified glaucoma: Secondary | ICD-10-CM | POA: Diagnosis not present

## 2017-01-17 DIAGNOSIS — N189 Chronic kidney disease, unspecified: Secondary | ICD-10-CM | POA: Diagnosis not present

## 2017-01-17 DIAGNOSIS — I10 Essential (primary) hypertension: Secondary | ICD-10-CM | POA: Diagnosis not present

## 2017-01-17 DIAGNOSIS — D560 Alpha thalassemia: Secondary | ICD-10-CM

## 2017-01-17 DIAGNOSIS — D508 Other iron deficiency anemias: Secondary | ICD-10-CM

## 2017-01-17 DIAGNOSIS — G629 Polyneuropathy, unspecified: Secondary | ICD-10-CM | POA: Diagnosis not present

## 2017-01-17 DIAGNOSIS — D649 Anemia, unspecified: Secondary | ICD-10-CM

## 2017-01-17 LAB — CBC WITH DIFFERENTIAL/PLATELET
BASO%: 0.3 % (ref 0.0–2.0)
BASOS ABS: 0 10*3/uL (ref 0.0–0.1)
EOS ABS: 0.1 10*3/uL (ref 0.0–0.5)
EOS%: 1.1 % (ref 0.0–7.0)
HCT: 31.8 % — ABNORMAL LOW (ref 34.8–46.6)
HGB: 10.1 g/dL — ABNORMAL LOW (ref 11.6–15.9)
LYMPH%: 16.9 % (ref 14.0–49.7)
MCH: 26 pg (ref 25.1–34.0)
MCHC: 31.8 g/dL (ref 31.5–36.0)
MCV: 82 fL (ref 79.5–101.0)
MONO#: 0.6 10*3/uL (ref 0.1–0.9)
MONO%: 6.4 % (ref 0.0–14.0)
NEUT#: 7.2 10*3/uL — ABNORMAL HIGH (ref 1.5–6.5)
NEUT%: 75.3 % (ref 38.4–76.8)
PLATELETS: 227 10*3/uL (ref 145–400)
RBC: 3.88 10*6/uL (ref 3.70–5.45)
RDW: 16.4 % — ABNORMAL HIGH (ref 11.2–14.5)
WBC: 9.6 10*3/uL (ref 3.9–10.3)
lymph#: 1.6 10*3/uL (ref 0.9–3.3)

## 2017-01-17 LAB — IRON AND TIBC
%SAT: 19 % — ABNORMAL LOW (ref 21–57)
IRON: 37 ug/dL — AB (ref 41–142)
TIBC: 189 ug/dL — AB (ref 236–444)
UIBC: 152 ug/dL (ref 120–384)

## 2017-01-17 LAB — FERRITIN: Ferritin: 153 ng/ml (ref 9–269)

## 2017-01-17 NOTE — Telephone Encounter (Signed)
-----   Message from Julie Pier, MD sent at 01/17/2017  4:44 PM EST ----- Please call patient, iron stores appear adequate, continue ferrous sulfate and follow-up as scheduled

## 2017-01-17 NOTE — Telephone Encounter (Signed)
Scheduled appt per 12/20 los - Gave patient AVS and calender per los.  

## 2017-01-17 NOTE — Progress Notes (Signed)
  [facility] OFFICE PROGRESS NOTE   Diagnosis: Anemia  INTERVAL HISTORY:   Julie Brewer returns as scheduled.  She feels well.  She is taking iron.  No bleeding.  No new complaint.  Objective:  Vital signs in last 24 hours:  Blood pressure (!) 151/59, pulse 93, temperature 98.2 F (36.8 C), temperature source Oral, resp. rate 20, height 5' 3.25" (1.607 m), weight 139 lb 14.4 oz (63.5 kg), SpO2 100 %.   Resp: Lungs clear bilaterally Cardio: Regular rate and rhythm GI: No hepatosplenomegaly, nontender Vascular: No leg edema   Lab Results:  Lab Results  Component Value Date   WBC 9.6 01/17/2017   HGB 10.1 (L) 01/17/2017   HCT 31.8 (L) 01/17/2017   MCV 82.0 01/17/2017   PLT 227 01/17/2017   NEUTROABS 7.2 (H) 01/17/2017    CMP     Component Value Date/Time   NA 130 (L) 12/19/2016 1018   K 4.6 12/19/2016 1018   CL 104 12/19/2016 1018   CO2 20 12/19/2016 1018   GLUCOSE 101 (H) 12/19/2016 1018   BUN 28 (H) 12/19/2016 1018   CREATININE 1.51 (H) 12/19/2016 1018   CALCIUM 9.4 12/19/2016 1018   PROT 6.5 12/19/2016 1018   ALBUMIN 3.9 12/19/2016 1018   AST 12 12/19/2016 1018   ALT 10 12/19/2016 1018   ALKPHOS 78 12/19/2016 1018   BILITOT 0.3 12/19/2016 1018   GFRNONAA >60 06/04/2014 1152   GFRAA >60 06/04/2014 1152     Medications: I have reviewed the patient's current medications.   Assessment/Plan: 1. Mild anemia ? Low ferritin (6.9) 01/09/2016 and (9.7) 02/28/2016 ? Ferritin normal (104) 03/09/2016 ? Negative stool Hemoccult 03/09/2016 (she is unable to complete stool cards due to blindness) ? Ferritin normal (86) 05/18/2016 ? 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 Chronic renal failure 4. Hypertension 5. Diabetes 6. Glaucoma with blindness 7. Peripheral neuropathy 8. 07/09/2012 colonoscopy-2 sessile polyps found in the rectum, removed (hyperplastic polyp, no adenomatous change  or malignancy)  Disposition: She appears unchanged.  The hemoglobin is slightly lower today and the MCV is higher.  I suspect the anemia is related to iron deficiency.  There may be a component from renal failure.  She will continue iron.  We will follow-up on the iron studies from today.  We will consider IV iron if the ferritin is low.  We discussed erythropoietin therapy.  I do not recommend erythropoietin unless the hemoglobin is lower.  She reports undergoing a colonoscopy earlier this year.  She will return for an office visit and CBC in 3 months.  Julie Coder, MD  01/17/2017  11:46 AM

## 2017-01-17 NOTE — Progress Notes (Signed)
[facility] Summary Report / Loop Recorder 

## 2017-01-17 NOTE — Telephone Encounter (Signed)
Need refill of indapamide (LOZOL) 1.25 MG tablet [[id number]]   Send to [place], [place] - [address]

## 2017-01-17 NOTE — Telephone Encounter (Signed)
Notified pt of lab result, per MD note below. She voiced appreciation for call.

## 2017-01-18 ENCOUNTER — Telehealth: Payer: Self-pay

## 2017-01-18 NOTE — Telephone Encounter (Signed)
Spoke with Dr. Teena Irani' office to request latest colonoscopy report. Receptionist to fax report.

## 2017-01-30 LAB — CUP PACEART REMOTE DEVICE CHECK
Implantable Pulse Generator Implant Date: 20180523
MDC IDC SESS DTM: 20181219231018

## 2017-02-01 ENCOUNTER — Other Ambulatory Visit: Payer: Self-pay | Admitting: Surgery

## 2017-02-05 ENCOUNTER — Other Ambulatory Visit: Payer: Self-pay | Admitting: Endocrinology

## 2017-02-15 ENCOUNTER — Ambulatory Visit (INDEPENDENT_AMBULATORY_CARE_PROVIDER_SITE_OTHER): Payer: Medicare HMO | Admitting: *Deleted

## 2017-02-15 DIAGNOSIS — R55 Syncope and collapse: Secondary | ICD-10-CM

## 2017-02-18 ENCOUNTER — Ambulatory Visit: Payer: Medicare HMO | Admitting: Podiatry

## 2017-02-18 NOTE — Progress Notes (Signed)
[facility] Summary Report / Loop Recorder 

## 2017-02-19 LAB — CUP PACEART REMOTE DEVICE CHECK
Date Time Interrogation Session: 20190118230957
MDC IDC PG IMPLANT DT: 20180523

## 2017-02-20 ENCOUNTER — Encounter: Payer: Self-pay | Admitting: Podiatry

## 2017-02-20 ENCOUNTER — Ambulatory Visit: Payer: Medicare HMO | Admitting: Podiatry

## 2017-02-20 DIAGNOSIS — M79674 Pain in right toe(s): Secondary | ICD-10-CM

## 2017-02-20 DIAGNOSIS — Q828 Other specified congenital malformations of skin: Secondary | ICD-10-CM | POA: Diagnosis not present

## 2017-02-20 DIAGNOSIS — D689 Coagulation defect, unspecified: Secondary | ICD-10-CM

## 2017-02-20 DIAGNOSIS — B351 Tinea unguium: Secondary | ICD-10-CM

## 2017-02-20 DIAGNOSIS — E1151 Type 2 diabetes mellitus with diabetic peripheral angiopathy without gangrene: Secondary | ICD-10-CM

## 2017-02-20 DIAGNOSIS — M79675 Pain in left toe(s): Secondary | ICD-10-CM | POA: Diagnosis not present

## 2017-02-20 NOTE — Progress Notes (Signed)
Complaint:  Visit Type: Patient returns to my office for continued preventative foot care services. Complaint: Patient states" my nails have grown long and thick and become painful to walk and wear shoes" Patient has been diagnosed with DM with angiopathy.. The patient presents for preventative foot care services. No changes to ROS.  Patient is blind and taking pletal.  Podiatric Exam: Vascular: dorsalis pedis and posterior tibial pulses are weakly palpable bilateral. Capillary return is immediate. Temperature gradient is WNL. Skin turgor WNL  Sensorium: Normal Semmes Weinstein monofilament test. Normal tactile sensation bilaterally. Nail Exam: Pt has thick disfigured discolored nails with subungual debris noted bilateral entire nail hallux through fifth toenails Ulcer Exam: There is no evidence of ulcer or pre-ulcerative changes or infection. Orthopedic Exam: Muscle tone and strength are WNL. No limitations in general ROM. No crepitus or effusions noted. Foot type and digits show no abnormalities. Bony prominences are unremarkable. Skin:  Porokeratosis sub 2,5 right foot.No infection or ulcers  Diagnosis:  Onychomycosis, , Pain in right toe, pain in left toes  Treatment & Plan Procedures and Treatment: Consent by patient was obtained for treatment procedures.   Debridement of mycotic and hypertrophic toenails, 1 through 5 bilateral and clearing of subungual debris. No ulceration, no infection noted.  Return Visit-Office Procedure: Patient instructed to return to the office for a follow up visit 3 months for continued evaluation and treatment.    Gardiner Barefoot DPM

## 2017-03-04 ENCOUNTER — Telehealth: Payer: Self-pay | Admitting: Endocrinology

## 2017-03-04 NOTE — Telephone Encounter (Signed)
Needs to be done by PCP

## 2017-03-04 NOTE — Telephone Encounter (Signed)
Please advise if okay to fill? Last filled by Historical Provider.

## 2017-03-04 NOTE — Telephone Encounter (Signed)
Called patient and let her know that she needs to have filled by her PCP and she stated that she will have her daughter call Dr. Oran Rein office in the morning to ask for this prescription refill.

## 2017-03-04 NOTE — Telephone Encounter (Signed)
Patient needs script for Lisinopril sent to [facility], [place]

## 2017-03-18 ENCOUNTER — Ambulatory Visit (INDEPENDENT_AMBULATORY_CARE_PROVIDER_SITE_OTHER): Payer: Medicare HMO | Admitting: *Deleted

## 2017-03-18 DIAGNOSIS — R55 Syncope and collapse: Secondary | ICD-10-CM | POA: Diagnosis not present

## 2017-03-19 NOTE — Progress Notes (Signed)
[facility] Summary Report / Loop Recorder 

## 2017-03-21 ENCOUNTER — Other Ambulatory Visit (INDEPENDENT_AMBULATORY_CARE_PROVIDER_SITE_OTHER): Payer: Medicare HMO

## 2017-03-21 DIAGNOSIS — Z794 Long term (current) use of insulin: Secondary | ICD-10-CM

## 2017-03-21 DIAGNOSIS — E1165 Type 2 diabetes mellitus with hyperglycemia: Secondary | ICD-10-CM | POA: Diagnosis not present

## 2017-03-21 LAB — MICROALBUMIN / CREATININE URINE RATIO
CREATININE, U: 67.2 mg/dL
Microalb Creat Ratio: 13.4 mg/g (ref 0.0–30.0)
Microalb, Ur: 9 mg/dL — ABNORMAL HIGH (ref 0.0–1.9)

## 2017-03-21 LAB — COMPREHENSIVE METABOLIC PANEL
ALK PHOS: 85 U/L (ref 39–117)
ALT: 12 U/L (ref 0–35)
AST: 12 U/L (ref 0–37)
Albumin: 3.4 g/dL — ABNORMAL LOW (ref 3.5–5.2)
BILIRUBIN TOTAL: 0.2 mg/dL (ref 0.2–1.2)
BUN: 21 mg/dL (ref 6–23)
CO2: 23 meq/L (ref 19–32)
Calcium: 9.3 mg/dL (ref 8.4–10.5)
Chloride: 105 mEq/L (ref 96–112)
Creatinine, Ser: 1.35 mg/dL — ABNORMAL HIGH (ref 0.40–1.20)
GFR: 50.7 mL/min — AB (ref >60.00)
Glucose, Bld: 79 mg/dL (ref 70–99)
POTASSIUM: 4.4 meq/L (ref 3.5–5.1)
Sodium: 132 mEq/L — ABNORMAL LOW (ref 135–145)
TOTAL PROTEIN: 6.3 g/dL (ref 6.0–8.3)

## 2017-03-21 LAB — HEMOGLOBIN A1C: Hgb A1c MFr Bld: 5.5 % (ref 4.6–6.5)

## 2017-03-26 ENCOUNTER — Ambulatory Visit: Payer: Medicare HMO | Admitting: Endocrinology

## 2017-03-26 ENCOUNTER — Encounter: Payer: Self-pay | Admitting: Endocrinology

## 2017-03-26 VITALS — BP 140/60 | HR 97 | Wt 138.4 lb

## 2017-03-26 DIAGNOSIS — E1165 Type 2 diabetes mellitus with hyperglycemia: Secondary | ICD-10-CM | POA: Diagnosis not present

## 2017-03-26 DIAGNOSIS — Z794 Long term (current) use of insulin: Secondary | ICD-10-CM

## 2017-03-26 NOTE — Progress Notes (Signed)
Patient ID: Julie Brewer, female   DOB: [birth year], 67 y.o.   MRN: [id number]   Reason for Appointment: Follow-up  History of Present Illness   Diagnosis: Type 2 DIABETES MELITUS, date of diagnosis:  1985  Prior history: She has been on insulin since diagnosis and on Lantus previously Also at some point had been started on Glucophage several years ago also Because of insurance preference Lantus was changed to Levemir  She refuses to use analog rapid acting insulin because of cost and is using regular insulin for several years   Her blood sugars are generally well controlled and A1c usually under 7% Her A1c previously was higher with stopping metformin at 8%  Recent history:   Insulin regimen: Levemir insulin 20 in the morning daily. Regular insulin 30 minutes Before eating, 10 units a.m. and 5 ac supper  Oral hypoglycemic drugs: Metformin ER 750 MG once daily    Her A1c is surprisingly low at 5.5, previously 6.1 and higher  Current blood sugar patterns, management and problems:  She is unable to check her blood sugar at home and does not have any family members who are willing to do so  Lab glucose was 79 fasting  Her insulin dose regimen has been unchanged over the last few visits  She does say that even when she is eating one serving of carbohydrate at suppertime she will still feel a little numbness in her mouth indicating low blood sugars and she does not take more than 5 units at suppertime Regular Insulin  She is taking 10 units to cover her breakfast as before although diet may be somewhat variable in the morning  Has not had any symptoms of low sugars overnight  Again is very consistent with taking her regular insulin 30 min before eating and Levemir in the morning  Her niece is drawing up her insulin  Side effects from medications: None      Glucometer:  FreeStyle which is not being used currently     Meals:  usually 2 meals per day at 10 AM and  5 PM.    Mealtime protein sources:turkey, chicken.  Eating cereal for breakfast at times Avoiding sweet drinks Physical activity: exercise: Just walking within the house              Microalbumin has been mildly increased previously but now normal   Wt Readings from Last 3 Encounters:  03/26/17 138 lb 6.4 oz (62.8 kg)  01/17/17 139 lb 14.4 oz (63.5 kg)  12/24/16 142 lb (64.4 kg)   Lab Results  Component Value Date   HGBA1C 5.5 03/21/2017   HGBA1C 6.1 12/19/2016   HGBA1C 6.6 (H) 09/13/2016   Lab Results  Component Value Date   MICROALBUR 9.0 (H) 03/21/2017   LDLCALC 49 09/13/2015   CREATININE 1.35 (H) 03/21/2017      No results found for: FRUCTOSAMINE     Lab on 03/21/2017  Component Date Value Ref Range Status  . Sodium 03/21/2017 132* 135 - 145 mEq/L Final  . Potassium 03/21/2017 4.4  3.5 - 5.1 mEq/L Final  . Chloride 03/21/2017 105  96 - 112 mEq/L Final  . CO2 03/21/2017 23  19 - 32 mEq/L Final  . Glucose, Bld 03/21/2017 79  70 - 99 mg/dL Final  . BUN 03/21/2017 21  6 - 23 mg/dL Final  . Creatinine, Ser 03/21/2017 1.35* 0.40 - 1.20 mg/dL Final  . Total Bilirubin 03/21/2017 0.2  0.2 - 1.2 mg/dL  Final  . Alkaline Phosphatase 03/21/2017 85  39 - 117 U/L Final  . AST 03/21/2017 12  0 - 37 U/L Final  . ALT 03/21/2017 12  0 - 35 U/L Final  . Total Protein 03/21/2017 6.3  6.0 - 8.3 g/dL Final  . Albumin 03/21/2017 3.4* 3.5 - 5.2 g/dL Final  . Calcium 03/21/2017 9.3  8.4 - 10.5 mg/dL Final  . GFR 03/21/2017 50.70* >60.00 mL/min Final  . Hgb A1c MFr Bld 03/21/2017 5.5  4.6 - 6.5 % Final   Glycemic Control Guidelines for People with Diabetes:Non Diabetic:  <6%Goal of Therapy: <7%Additional Action Suggested:  >8%   . Microalb, Ur 03/21/2017 9.0* 0.0 - 1.9 mg/dL Final  . Creatinine,U 03/21/2017 67.2  mg/dL Final  . Microalb Creat Ratio 03/21/2017 13.4  0.0 - 30.0 mg/g Final    Allergies as of 03/26/2017      Reactions   Morphine And Related Other (See Comments)    Hallucenations    Penicillins Rash, Other (See Comments)   Swelling      Medication List        Accurate as of 03/26/17 10:20 AM. Always use your most recent med list.          amLODipine 10 MG tablet Commonly known as:  NORVASC Take 10 mg by mouth daily.   aspirin 325 MG EC tablet Take 325 mg by mouth 2 (two) times daily.   atorvastatin 20 MG tablet Commonly known as:  LIPITOR TAKE ONE TABLET BY MOUTH DAILY   cilostazol 100 MG tablet Commonly known as:  PLETAL TAKE 1 TABLET BY MOUTH TWICE DAILY   gabapentin 100 MG capsule Commonly known as:  NEURONTIN Take 1 capsule (100 mg total) by mouth 2 (two) times daily.   glucose blood test strip Commonly known as:  FREESTYLE LITE Use as instructed to check blood sugar 2 times a day dx code E11.9   indapamide 1.25 MG tablet Commonly known as:  LOZOL TAKE 1 TABLET BY MOUTH ONCE DAILY   insulin regular 100 units/mL injection Commonly known as:  NOVOLIN R,HUMULIN R Inject 5-10 Units into the skin See admin instructions. 10 units in the morning and 5-7 units in the evening.   IRON PO Take 1 tablet by mouth daily.   LEVEMIR 100 UNIT/ML injection Generic drug:  insulin detemir Inject 20 Units into the skin every morning.   lisinopril 40 MG tablet Commonly known as:  PRINIVIL,ZESTRIL Take 40 mg by mouth daily.   metFORMIN 750 MG 24 hr tablet Commonly known as:  GLUCOPHAGE-XR TAKE 1 TABLET BY MOUTH ONCE DAILY   omeprazole 20 MG capsule Commonly known as:  PRILOSEC TAKE ONE CAPSULE BY MOUTH ONCE DAILY (DUE FOR OFFICE VISIT THIS MONTH).   Vitamin D (Ergocalciferol) 50000 units Caps capsule Commonly known as:  DRISDOL TAKE ONE CAPSULE BY MOUTH EVERY 7 DAYS       Allergies:  Allergies  Allergen Reactions  . Morphine And Related Other (See Comments)    Hallucenations   . Penicillins Rash and Other (See Comments)    Swelling    Past Medical History:  Diagnosis Date  . Asthma    No probnlems recently  .  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 Diabetes mellitus    Type II per Dr [name]  (patient said type I)  . Fibroid   . GERD (gastroesophageal reflux disease)   . Glaucoma   .  Hyperlipidemia   . Hypertension   . Iron deficiency anemia 03/09/2016  . Peripheral vascular disease ([place])   . Pneumonia 2006  . Shortness of breath dyspnea    with exdrtion, "Walkling too fast"  . Stroke [facility])    no residual    Past Surgical History:  Procedure Laterality Date  . ABDOMINAL HYSTERECTOMY    . CERVICAL FUSION     with graft from hip  . COLONOSCOPY  July 09, 2012  . DIRECT LARYNGOSCOPY N/A 06/07/2014   Procedure: DIRECT LARYNGOSCOPY with BIOPSY and EXCISION VOLLECULAR CYST;  Surgeon: Ruby Cola, MD;  Location: [place];  Service: ENT;  Laterality: N/A;  . LOOP RECORDER INSERTION N/A 06/20/2016   Procedure: Loop Recorder Insertion;  Surgeon: [name], MD;  Location: [place] [facility];  Service: Cardiovascular;  Laterality: N/A;  . REFRACTIVE SURGERY Bilateral    both eyes  . SPINE SURGERY     lumbar    Family History  Problem Relation Age of Onset  . Cancer Mother   . Heart disease Mother   . Diabetes Father   . Cancer Brother   . Cancer Brother   . Throat cancer Brother     Social History:  reports that she has been smoking cigarettes.  She has a 30.00 pack-year smoking history. she has never used smokeless tobacco. She reports that she drinks alcohol. She reports that she does not use drugs.  Review of Systems:  HYPONATREMIA:  Her sodium is now 132, previously 130 and she was told to avoid excess fluid Also taking indapamide every other day now instead of daily  Blindness: She has absent vision on the right side and can see silhouettes only on the left.    HYPERTENSION:  controlled with taking amlodipine 10 mg, indapamide 1.25 and lisinopril 40 mg dose.    Renal function: Her creatinine is  relatively better now   Lab  Results  Component Value Date   CREATININE 1.35 (H) 03/21/2017   BUN 21 03/21/2017   NA 132 (L) 03/21/2017   K 4.4 03/21/2017   CL 105 03/21/2017   CO2 23 03/21/2017     HYPERLIPIDEMIA: The lipid abnormality consists of elevated LDL controlled with Lipitor 20 mg .   Her baseline LDL was 202 Her last LDL was 43 done in April 2018 by PCP  Lab Results  Component Value Date   CHOL 156 09/13/2015   HDL 82.20 09/13/2015   LDLCALC 49 09/13/2015   TRIG 125.0 09/13/2015   CHOLHDL 2 09/13/2015    Vitamin D: Taking 50,000 unit dosage once a month From her PCP Most recent level is normal, no updates available  Lab Results  Component Value Date   VD25OH 40.22 09/13/2016       Last diabetic foot exam was done in 4/18  Takes Gabapentin regularly twice daily with control of her leg pains    Examination:   BP 140/60 (BP Location: Left Arm, Patient Position: Sitting, Cuff Size: Normal)   Pulse 97   Wt 138 lb 6.4 oz (62.8 kg)   SpO2 95%   BMI 24.32 kg/m   Body mass index is 24.32 kg/m.   REPEAT blood pressure standing On the right side was 128/60, left side 118/58 No pedal edema  ASSESSMENT/ PLAN:   Diabetes type 2  See history of present illness for detailed discussion of current management, blood sugar patterns and problems identified  Although she has not lost weight recently overall weight is down  compared to last year and her A1c is down to only 5.5 Not clear if she is having anemia causing falsely low A1c but again her blood sugars at home are not available  Since fasting reading in the lab was only 79 will reduce his Levemir by 2 units for now  She'll continue her mealtime insulin and metformin unchanged  HYPONATREMIA: Her sodium is now 132, better with reducing indapamide to every other day   HYPERTENSION:  Blood pressure is not as low with reducing indapamide, no orthostasis and renal function is also better with reducing indapamide She will continue the same  regimen  Renal dysfunction: She will continue follow-up with nephrologist, recently creatinine improved    There are no Patient Instructions on file for this visit.  Elayne Snare 03/26/2017, 10:20 AM     Note: This office note was prepared with Dragon voice recognition system technology. Any transcriptional errors that result from this process are unintentional.

## 2017-03-26 NOTE — Patient Instructions (Signed)
LEVEMIR 18 daily

## 2017-04-15 LAB — CUP PACEART REMOTE DEVICE CHECK
Implantable Pulse Generator Implant Date: 20180523
MDC IDC SESS DTM: 20190218213734

## 2017-04-20 ENCOUNTER — Other Ambulatory Visit: Payer: Self-pay | Admitting: Endocrinology

## 2017-04-22 ENCOUNTER — Ambulatory Visit (INDEPENDENT_AMBULATORY_CARE_PROVIDER_SITE_OTHER): Payer: Medicare HMO | Admitting: *Deleted

## 2017-04-22 DIAGNOSIS — R55 Syncope and collapse: Secondary | ICD-10-CM

## 2017-04-22 NOTE — Progress Notes (Signed)
[facility] Summary Report / Loop Recorder 

## 2017-04-23 ENCOUNTER — Telehealth: Payer: Self-pay | Admitting: Endocrinology

## 2017-04-23 ENCOUNTER — Other Ambulatory Visit: Payer: Self-pay | Admitting: Family Medicine

## 2017-04-23 ENCOUNTER — Other Ambulatory Visit: Payer: Self-pay

## 2017-04-23 DIAGNOSIS — Z1231 Encounter for screening mammogram for malignant neoplasm of breast: Secondary | ICD-10-CM

## 2017-04-23 MED ORDER — OMEPRAZOLE 20 MG PO CPDR: DELAYED_RELEASE_CAPSULE | ORAL | 2 refills | Status: DC

## 2017-04-23 NOTE — Telephone Encounter (Signed)
Need refill of omeprazole (PRILOSEC) 20 MG capsule [[id number]]  [place], [place] - [address]

## 2017-04-23 NOTE — Telephone Encounter (Signed)
This has been done.

## 2017-04-25 ENCOUNTER — Inpatient Hospital Stay: Payer: Medicare HMO

## 2017-04-25 ENCOUNTER — Inpatient Hospital Stay: Payer: Medicare HMO | Attending: Nurse Practitioner | Admitting: Nurse Practitioner

## 2017-04-25 ENCOUNTER — Telehealth: Payer: Self-pay | Admitting: Oncology

## 2017-04-25 ENCOUNTER — Encounter: Payer: Self-pay | Admitting: Nurse Practitioner

## 2017-04-25 VITALS — BP 142/44 | HR 93 | Temp 98.3°F | Resp 18 | Ht 63.25 in | Wt 134.5 lb

## 2017-04-25 DIAGNOSIS — D649 Anemia, unspecified: Secondary | ICD-10-CM | POA: Diagnosis not present

## 2017-04-25 DIAGNOSIS — D563 Thalassemia minor: Secondary | ICD-10-CM

## 2017-04-25 DIAGNOSIS — N189 Chronic kidney disease, unspecified: Secondary | ICD-10-CM | POA: Insufficient documentation

## 2017-04-25 DIAGNOSIS — E1122 Type 2 diabetes mellitus with diabetic chronic kidney disease: Secondary | ICD-10-CM

## 2017-04-25 DIAGNOSIS — I129 Hypertensive chronic kidney disease with stage 1 through stage 4 chronic kidney disease, or unspecified chronic kidney disease: Secondary | ICD-10-CM | POA: Diagnosis not present

## 2017-04-25 DIAGNOSIS — H409 Unspecified glaucoma: Secondary | ICD-10-CM | POA: Insufficient documentation

## 2017-04-25 DIAGNOSIS — D508 Other iron deficiency anemias: Secondary | ICD-10-CM

## 2017-04-25 LAB — CBC WITH DIFFERENTIAL/PLATELET
Basophils Absolute: 0.1 10*3/uL (ref 0.0–0.1)
Basophils Relative: 1 %
Eosinophils Absolute: 0.1 10*3/uL (ref 0.0–0.5)
Eosinophils Relative: 1 %
HEMATOCRIT: 31.4 % — AB (ref 34.8–46.6)
HEMOGLOBIN: 10.1 g/dL — AB (ref 11.6–15.9)
LYMPHS ABS: 1.6 10*3/uL (ref 0.9–3.3)
LYMPHS PCT: 14 %
MCH: 26 pg (ref 25.1–34.0)
MCHC: 32.1 g/dL (ref 31.5–36.0)
MCV: 81 fL (ref 79.5–101.0)
MONOS PCT: 8 %
Monocytes Absolute: 1 10*3/uL — ABNORMAL HIGH (ref 0.1–0.9)
NEUTROS ABS: 9 10*3/uL — AB (ref 1.5–6.5)
NEUTROS PCT: 76 %
Platelets: 337 10*3/uL (ref 145–400)
RBC: 3.88 MIL/uL (ref 3.70–5.45)
RDW: 15.6 % — ABNORMAL HIGH (ref 11.2–14.5)
WBC: 11.8 10*3/uL — ABNORMAL HIGH (ref 3.9–10.3)

## 2017-04-25 LAB — BASIC METABOLIC PANEL
ANION GAP: 8 (ref 3–11)
BUN: 34 mg/dL — ABNORMAL HIGH (ref 7–26)
CHLORIDE: 106 mmol/L (ref 98–109)
CO2: 17 mmol/L — AB (ref 22–29)
Calcium: 9 mg/dL (ref 8.4–10.4)
Creatinine, Ser: 1.54 mg/dL — ABNORMAL HIGH (ref 0.60–1.10)
GFR calc non Af Amer: 35 mL/min — ABNORMAL LOW (ref >60)
GFR, EST AFRICAN AMERICAN: 40 mL/min — AB (ref >60)
Glucose, Bld: 75 mg/dL (ref 70–140)
Potassium: 4.6 mmol/L (ref 3.5–5.1)
Sodium: 131 mmol/L — ABNORMAL LOW (ref 136–145)

## 2017-04-25 LAB — IRON AND TIBC
Iron: 47 ug/dL (ref 41–142)
Saturation Ratios: 27 % (ref 21–57)
TIBC: 176 ug/dL — ABNORMAL LOW (ref 236–444)
UIBC: 129 ug/dL

## 2017-04-25 LAB — FERRITIN: FERRITIN: 124 ng/mL (ref 9–269)

## 2017-04-25 NOTE — Progress Notes (Signed)
  [facility] OFFICE PROGRESS NOTE   Diagnosis: Anemia  INTERVAL HISTORY:   Julie Brewer returns as scheduled.  She continues oral iron twice a day.  She has some constipation which she attributes to the iron.  She takes a stool softener.  She is not aware of any bleeding.  Objective:  Vital signs in last 24 hours:  Blood pressure (!) 142/44, pulse 93, temperature 98.3 F (36.8 C), temperature source Oral, resp. rate 18, height 5' 3.25" (1.607 m), weight 134 lb 8 oz (61 kg), SpO2 100 %.    HEENT: No thrush or ulcers. Resp: Lungs clear bilaterally. Cardio: Regular rate and rhythm. GI: Abdomen soft and nontender.  No hepatosplenomegaly. Vascular: No leg edema.  Lab Results:  Lab Results  Component Value Date   WBC 11.8 (H) 04/25/2017   HGB 10.1 (L) 04/25/2017   HCT 31.4 (L) 04/25/2017   MCV 81.0 04/25/2017   PLT 337 04/25/2017   NEUTROABS 9.0 (H) 04/25/2017    Imaging:  No results found.  Medications: I have reviewed the patient's current medications.  Assessment/Plan: 1. Mild anemia ? Low ferritin(6.9)01/09/2016 and(9.7)02/28/2016 ? Ferritin normal (104) 03/09/2016 ? Negative stool Hemoccult 03/09/2016 (she is unable to complete stool cards due to blindness) ? Ferritin normal (86)05/18/2016 ? 04/20/2018molecular testing confirmsalphathalassemia trait 2. Chronic red cell microcytosis 3. Chronic renal failure 4. Hypertension 5. Diabetes 6. Glaucoma with blindness 7. Peripheral neuropathy 8. 07/09/2012 colonoscopy-2 sessile polyps found in the rectum, removed (hyperplastic polyp, no adenomatous change or malignancy)     Disposition: Julie Brewer remains stable from a hematologic standpoint.  She has persistent anemia which is likely related to iron deficiency as well as a component from renal failure.  Dr. Benay Spice does not recommend erythropoietin unless the hemoglobin is lower.  She will continue oral iron.  She will return for  lab and follow-up in 3 months.  She will contact the office in the interim with any problems.  Plan reviewed with Dr. Benay Spice.    Ned Card ANP/GNP-BC   04/25/2017  12:05 PM

## 2017-04-25 NOTE — Telephone Encounter (Signed)
Appointments scheduled AVS/Calendar printed per 3/28 los °

## 2017-05-01 ENCOUNTER — Ambulatory Visit: Payer: Medicare HMO | Admitting: Podiatry

## 2017-05-08 ENCOUNTER — Telehealth: Payer: Self-pay | Admitting: Endocrinology

## 2017-05-08 NOTE — Telephone Encounter (Signed)
Patient need a refill for her metformin, send to [place], [place] - [address] DEA #:  --    Pharmacy Comments:  --

## 2017-05-09 ENCOUNTER — Other Ambulatory Visit: Payer: Self-pay

## 2017-05-09 MED ORDER — METFORMIN HCL ER 750 MG PO TB24: 750.0000 mg | ORAL_TABLET | Freq: Every day | ORAL | 0 refills | Status: DC

## 2017-05-09 NOTE — Telephone Encounter (Signed)
New script sent to pharmacy per pt request. Pt was notified of new RX.

## 2017-05-15 ENCOUNTER — Ambulatory Visit: Payer: Medicare HMO | Admitting: Podiatry

## 2017-05-15 ENCOUNTER — Encounter: Payer: Self-pay | Admitting: Podiatry

## 2017-05-15 DIAGNOSIS — B351 Tinea unguium: Secondary | ICD-10-CM

## 2017-05-15 DIAGNOSIS — E1151 Type 2 diabetes mellitus with diabetic peripheral angiopathy without gangrene: Secondary | ICD-10-CM

## 2017-05-15 DIAGNOSIS — D689 Coagulation defect, unspecified: Secondary | ICD-10-CM | POA: Diagnosis not present

## 2017-05-15 DIAGNOSIS — M79674 Pain in right toe(s): Secondary | ICD-10-CM

## 2017-05-15 DIAGNOSIS — Q828 Other specified congenital malformations of skin: Secondary | ICD-10-CM

## 2017-05-15 DIAGNOSIS — M79675 Pain in left toe(s): Secondary | ICD-10-CM

## 2017-05-15 NOTE — Progress Notes (Signed)
Complaint:  Visit Type: Patient returns to my office for continued preventative foot care services. Complaint: Patient states" my nails have grown long and thick and become painful to walk and wear shoes" Patient has been diagnosed with DM with angiopathy.. The patient presents for preventative foot care services. No changes to ROS.  Patient is blind and taking pletal.  Podiatric Exam: Vascular: dorsalis pedis and posterior tibial pulses are weakly palpable bilateral. Capillary return is immediate. Temperature gradient is WNL. Skin turgor WNL  Sensorium: Normal Semmes Weinstein monofilament test. Normal tactile sensation bilaterally. Nail Exam: Pt has thick disfigured discolored nails with subungual debris noted bilateral entire nail hallux through fifth toenails Ulcer Exam: There is no evidence of ulcer or pre-ulcerative changes or infection. Orthopedic Exam: Muscle tone and strength are WNL. No limitations in general ROM. No crepitus or effusions noted. Foot type and digits show no abnormalities. Bony prominences are unremarkable. Skin:  Porokeratosis sub 2,5 right foot.  Sub 2 left. No infection or ulcers  Diagnosis:  Onychomycosis, , Pain in right toe, pain in left toes  Treatment & Plan Procedures and Treatment: Consent by patient was obtained for treatment procedures.   Debridement of mycotic and hypertrophic toenails, 1 through 5 bilateral and clearing of subungual debris. No ulceration, no infection noted.  Return Visit-Office Procedure: Patient instructed to return to the office for a follow up visit 3 months for continued evaluation and treatment.    Gardiner Barefoot DPM

## 2017-05-21 DIAGNOSIS — Z0000 Encounter for general adult medical examination without abnormal findings: Secondary | ICD-10-CM | POA: Diagnosis not present

## 2017-05-21 DIAGNOSIS — I10 Essential (primary) hypertension: Secondary | ICD-10-CM | POA: Diagnosis not present

## 2017-05-21 DIAGNOSIS — Z136 Encounter for screening for cardiovascular disorders: Secondary | ICD-10-CM | POA: Diagnosis not present

## 2017-05-21 DIAGNOSIS — E785 Hyperlipidemia, unspecified: Secondary | ICD-10-CM | POA: Diagnosis not present

## 2017-05-22 ENCOUNTER — Other Ambulatory Visit: Payer: Self-pay | Admitting: Endocrinology

## 2017-05-23 ENCOUNTER — Ambulatory Visit (INDEPENDENT_AMBULATORY_CARE_PROVIDER_SITE_OTHER): Payer: Medicare HMO | Admitting: *Deleted

## 2017-05-23 DIAGNOSIS — R55 Syncope and collapse: Secondary | ICD-10-CM

## 2017-05-24 NOTE — Progress Notes (Signed)
[facility] Summary Report / Loop Recorder 

## 2017-05-29 ENCOUNTER — Other Ambulatory Visit: Payer: Self-pay | Admitting: Endocrinology

## 2017-05-29 LAB — CUP PACEART REMOTE DEVICE CHECK
Date Time Interrogation Session: 20190323213941
MDC IDC PG IMPLANT DT: 20180523

## 2017-05-31 ENCOUNTER — Ambulatory Visit: Payer: Medicare HMO

## 2017-06-08 ENCOUNTER — Observation Stay (HOSPITAL_COMMUNITY)
Admit: 2017-06-08 | Discharge: 2017-06-08 | Disposition: A | Discharge status: Home / Self Care | Payer: Medicare HMO | Attending: Physician Assistant | Admitting: Physician Assistant

## 2017-06-08 ENCOUNTER — Encounter (HOSPITAL_COMMUNITY): Payer: Self-pay | Admitting: Emergency Medicine

## 2017-06-08 ENCOUNTER — Observation Stay (HOSPITAL_COMMUNITY): Payer: Medicare HMO

## 2017-06-08 ENCOUNTER — Other Ambulatory Visit: Payer: Self-pay

## 2017-06-08 ENCOUNTER — Inpatient Hospital Stay (HOSPITAL_COMMUNITY)
Admission: EM | Admit: 2017-06-08 | Discharge: 2017-06-10 | DRG: 378 | Disposition: A | Discharge status: Home / Self Care | Payer: Medicare HMO | Attending: Family Medicine | Admitting: Family Medicine

## 2017-06-08 DIAGNOSIS — E118 Type 2 diabetes mellitus with unspecified complications: Secondary | ICD-10-CM

## 2017-06-08 DIAGNOSIS — Z885 Allergy status to narcotic agent status: Secondary | ICD-10-CM

## 2017-06-08 DIAGNOSIS — N183 Chronic kidney disease, stage 3 unspecified: Secondary | ICD-10-CM | POA: Diagnosis present

## 2017-06-08 DIAGNOSIS — E1122 Type 2 diabetes mellitus with diabetic chronic kidney disease: Secondary | ICD-10-CM | POA: Diagnosis present

## 2017-06-08 DIAGNOSIS — R140 Abdominal distension (gaseous): Secondary | ICD-10-CM | POA: Diagnosis not present

## 2017-06-08 DIAGNOSIS — H547 Unspecified visual loss: Secondary | ICD-10-CM | POA: Diagnosis not present

## 2017-06-08 DIAGNOSIS — I10 Essential (primary) hypertension: Secondary | ICD-10-CM | POA: Diagnosis not present

## 2017-06-08 DIAGNOSIS — R404 Transient alteration of awareness: Secondary | ICD-10-CM | POA: Diagnosis not present

## 2017-06-08 DIAGNOSIS — D72829 Elevated white blood cell count, unspecified: Secondary | ICD-10-CM

## 2017-06-08 DIAGNOSIS — R195 Other fecal abnormalities: Secondary | ICD-10-CM | POA: Diagnosis not present

## 2017-06-08 DIAGNOSIS — E1151 Type 2 diabetes mellitus with diabetic peripheral angiopathy without gangrene: Secondary | ICD-10-CM | POA: Diagnosis present

## 2017-06-08 DIAGNOSIS — D649 Anemia, unspecified: Secondary | ICD-10-CM | POA: Diagnosis not present

## 2017-06-08 DIAGNOSIS — Z90710 Acquired absence of both cervix and uterus: Secondary | ICD-10-CM

## 2017-06-08 DIAGNOSIS — R55 Syncope and collapse: Secondary | ICD-10-CM

## 2017-06-08 DIAGNOSIS — Z794 Long term (current) use of insulin: Secondary | ICD-10-CM

## 2017-06-08 DIAGNOSIS — I739 Peripheral vascular disease, unspecified: Secondary | ICD-10-CM | POA: Diagnosis not present

## 2017-06-08 DIAGNOSIS — E785 Hyperlipidemia, unspecified: Secondary | ICD-10-CM | POA: Diagnosis present

## 2017-06-08 DIAGNOSIS — K921 Melena: Secondary | ICD-10-CM

## 2017-06-08 DIAGNOSIS — D509 Iron deficiency anemia, unspecified: Secondary | ICD-10-CM | POA: Diagnosis present

## 2017-06-08 DIAGNOSIS — J45909 Unspecified asthma, uncomplicated: Secondary | ICD-10-CM | POA: Diagnosis present

## 2017-06-08 DIAGNOSIS — E1140 Type 2 diabetes mellitus with diabetic neuropathy, unspecified: Secondary | ICD-10-CM | POA: Diagnosis present

## 2017-06-08 DIAGNOSIS — K633 Ulcer of intestine: Secondary | ICD-10-CM | POA: Diagnosis not present

## 2017-06-08 DIAGNOSIS — K219 Gastro-esophageal reflux disease without esophagitis: Secondary | ICD-10-CM | POA: Diagnosis present

## 2017-06-08 DIAGNOSIS — IMO0001 Reserved for inherently not codable concepts without codable children: Secondary | ICD-10-CM

## 2017-06-08 DIAGNOSIS — E119 Type 2 diabetes mellitus without complications: Secondary | ICD-10-CM

## 2017-06-08 DIAGNOSIS — K5150 Left sided colitis without complications: Secondary | ICD-10-CM | POA: Diagnosis not present

## 2017-06-08 DIAGNOSIS — R933 Abnormal findings on diagnostic imaging of other parts of digestive tract: Secondary | ICD-10-CM | POA: Diagnosis not present

## 2017-06-08 DIAGNOSIS — F17210 Nicotine dependence, cigarettes, uncomplicated: Secondary | ICD-10-CM | POA: Diagnosis present

## 2017-06-08 DIAGNOSIS — Z79899 Other long term (current) drug therapy: Secondary | ICD-10-CM

## 2017-06-08 DIAGNOSIS — Z95818 Presence of other cardiac implants and grafts: Secondary | ICD-10-CM | POA: Diagnosis not present

## 2017-06-08 DIAGNOSIS — K3189 Other diseases of stomach and duodenum: Secondary | ICD-10-CM | POA: Diagnosis not present

## 2017-06-08 DIAGNOSIS — I129 Hypertensive chronic kidney disease with stage 1 through stage 4 chronic kidney disease, or unspecified chronic kidney disease: Secondary | ICD-10-CM | POA: Diagnosis present

## 2017-06-08 DIAGNOSIS — Z7982 Long term (current) use of aspirin: Secondary | ICD-10-CM

## 2017-06-08 DIAGNOSIS — Z8673 Personal history of transient ischemic attack (TIA), and cerebral infarction without residual deficits: Secondary | ICD-10-CM | POA: Diagnosis not present

## 2017-06-08 DIAGNOSIS — K567 Ileus, unspecified: Secondary | ICD-10-CM | POA: Diagnosis present

## 2017-06-08 DIAGNOSIS — R0602 Shortness of breath: Secondary | ICD-10-CM | POA: Diagnosis not present

## 2017-06-08 DIAGNOSIS — K922 Gastrointestinal hemorrhage, unspecified: Secondary | ICD-10-CM | POA: Diagnosis not present

## 2017-06-08 DIAGNOSIS — Z4682 Encounter for fitting and adjustment of non-vascular catheter: Secondary | ICD-10-CM | POA: Diagnosis not present

## 2017-06-08 DIAGNOSIS — Z880 Allergy status to penicillin: Secondary | ICD-10-CM

## 2017-06-08 DIAGNOSIS — I361 Nonrheumatic tricuspid (valve) insufficiency: Secondary | ICD-10-CM | POA: Diagnosis not present

## 2017-06-08 DIAGNOSIS — K6389 Other specified diseases of intestine: Secondary | ICD-10-CM | POA: Diagnosis not present

## 2017-06-08 DIAGNOSIS — D508 Other iron deficiency anemias: Secondary | ICD-10-CM | POA: Diagnosis not present

## 2017-06-08 DIAGNOSIS — K529 Noninfective gastroenteritis and colitis, unspecified: Secondary | ICD-10-CM | POA: Diagnosis not present

## 2017-06-08 DIAGNOSIS — K8020 Calculus of gallbladder without cholecystitis without obstruction: Secondary | ICD-10-CM | POA: Diagnosis not present

## 2017-06-08 DIAGNOSIS — R112 Nausea with vomiting, unspecified: Secondary | ICD-10-CM | POA: Diagnosis not present

## 2017-06-08 DIAGNOSIS — Z4659 Encounter for fitting and adjustment of other gastrointestinal appliance and device: Secondary | ICD-10-CM

## 2017-06-08 LAB — CBC
HCT: 23.5 % — ABNORMAL LOW (ref 36.0–46.0)
HEMATOCRIT: 30.2 % — AB (ref 36.0–46.0)
HEMATOCRIT: 31.4 % — AB (ref 36.0–46.0)
Hemoglobin: 9.5 g/dL — ABNORMAL LOW (ref 12.0–15.0)
Hemoglobin: 7.3 g/dL — ABNORMAL LOW (ref 12.0–15.0)
Hemoglobin: 9.9 g/dL — ABNORMAL LOW (ref 12.0–15.0)
MCH: 25.7 pg — AB (ref 26.0–34.0)
MCH: 25.3 pg — ABNORMAL LOW (ref 26.0–34.0)
MCH: 25.8 pg — ABNORMAL LOW (ref 26.0–34.0)
MCHC: 31.5 g/dL (ref 30.0–36.0)
MCHC: 31.1 g/dL (ref 30.0–36.0)
MCHC: 31.5 g/dL (ref 30.0–36.0)
MCV: 81.6 fL (ref 78.0–100.0)
MCV: 81.6 fL (ref 78.0–100.0)
MCV: 82 fL (ref 78.0–100.0)
PLATELETS: 299 10*3/uL (ref 150–400)
PLATELETS: 237 10*3/uL (ref 150–400)
PLATELETS: 292 10*3/uL (ref 150–400)
RBC: 3.7 MIL/uL — ABNORMAL LOW (ref 3.87–5.11)
RBC: 2.88 MIL/uL — AB (ref 3.87–5.11)
RBC: 3.83 MIL/uL — ABNORMAL LOW (ref 3.87–5.11)
RDW: 16.2 % — AB (ref 11.5–15.5)
RDW: 16.2 % — ABNORMAL HIGH (ref 11.5–15.5)
RDW: 16.5 % — AB (ref 11.5–15.5)
WBC: 22.3 10*3/uL — ABNORMAL HIGH (ref 4.0–10.5)
WBC: 17.6 10*3/uL — ABNORMAL HIGH (ref 4.0–10.5)
WBC: 16.3 10*3/uL — ABNORMAL HIGH (ref 4.0–10.5)

## 2017-06-08 LAB — I-STAT TROPONIN, ED: Troponin i, poc: 0 ng/mL (ref 0.00–0.08)

## 2017-06-08 LAB — BASIC METABOLIC PANEL
Anion gap: 9 (ref 5–15)
BUN: 20 mg/dL (ref 6–20)
CHLORIDE: 109 mmol/L (ref 101–111)
CO2: 19 mmol/L — AB (ref 22–32)
CREATININE: 1.66 mg/dL — AB (ref 0.44–1.00)
Calcium: 9 mg/dL (ref 8.9–10.3)
GFR calc Af Amer: 37 mL/min — ABNORMAL LOW (ref >60)
GFR calc non Af Amer: 32 mL/min — ABNORMAL LOW (ref >60)
Glucose, Bld: 122 mg/dL — ABNORMAL HIGH (ref 65–99)
POTASSIUM: 4.6 mmol/L (ref 3.5–5.1)
Sodium: 137 mmol/L (ref 135–145)

## 2017-06-08 LAB — TYPE AND SCREEN
ABO/RH(D): B POS
ANTIBODY SCREEN: NEGATIVE

## 2017-06-08 LAB — CBG MONITORING, ED
Glucose-Capillary: 125 mg/dL — ABNORMAL HIGH (ref 65–99)
Glucose-Capillary: 131 mg/dL — ABNORMAL HIGH (ref 65–99)

## 2017-06-08 LAB — ABO/RH: ABO/RH(D): B POS

## 2017-06-08 LAB — POC OCCULT BLOOD, ED: FECAL OCCULT BLD: POSITIVE — AB

## 2017-06-08 LAB — TSH: TSH: 1.022 u[IU]/mL (ref 0.350–4.500)

## 2017-06-08 MED ORDER — SODIUM CHLORIDE 0.9 % IV BOLUS: 500.0000 mL | Freq: Once | INTRAVENOUS | Status: DC

## 2017-06-08 MED ORDER — INSULIN DETEMIR 100 UNIT/ML ~~LOC~~ SOLN
10.0000 [IU] | SUBCUTANEOUS | Status: DC
  Administered 2017-06-10: 10 [IU] via SUBCUTANEOUS
  Filled 2017-06-08 (×2): qty 0.1

## 2017-06-08 MED ORDER — GABAPENTIN 100 MG PO CAPS
100.0000 mg | ORAL_CAPSULE | Freq: Two times a day (BID) | ORAL | Status: DC
  Administered 2017-06-08 – 2017-06-10 (×4): 100 mg via ORAL
  Filled 2017-06-08 (×4): qty 1

## 2017-06-08 MED ORDER — ACETAMINOPHEN 650 MG RE SUPP: 650.0000 mg | Freq: Four times a day (QID) | RECTAL | Status: DC | PRN

## 2017-06-08 MED ORDER — HYDROCODONE-ACETAMINOPHEN 5-325 MG PO TABS: 1.0000 | ORAL_TABLET | ORAL | Status: DC | PRN

## 2017-06-08 MED ORDER — ATORVASTATIN CALCIUM 20 MG PO TABS
20.0000 mg | ORAL_TABLET | Freq: Every day | ORAL | Status: DC
  Administered 2017-06-08 – 2017-06-09 (×2): 20 mg via ORAL
  Filled 2017-06-08 (×3): qty 1

## 2017-06-08 MED ORDER — FAMOTIDINE IN NACL 20-0.9 MG/50ML-% IV SOLN
20.0000 mg | Freq: Two times a day (BID) | INTRAVENOUS | Status: DC
  Administered 2017-06-08 – 2017-06-10 (×4): 20 mg via INTRAVENOUS
  Filled 2017-06-08 (×4): qty 50

## 2017-06-08 MED ORDER — SODIUM CHLORIDE 0.9 % IV BOLUS
500.0000 mL | Freq: Once | INTRAVENOUS | Status: AC
  Administered 2017-06-08: 500 mL via INTRAVENOUS

## 2017-06-08 MED ORDER — ONDANSETRON HCL 4 MG/2ML IJ SOLN
4.0000 mg | Freq: Four times a day (QID) | INTRAMUSCULAR | Status: DC | PRN
  Administered 2017-06-08: 4 mg via INTRAVENOUS
  Filled 2017-06-08: qty 2

## 2017-06-08 MED ORDER — ACETAMINOPHEN 325 MG PO TABS: 650.0000 mg | ORAL_TABLET | Freq: Four times a day (QID) | ORAL | Status: DC | PRN

## 2017-06-08 MED ORDER — FERROUS SULFATE 325 (65 FE) MG PO TABS
325.0000 mg | ORAL_TABLET | Freq: Every day | ORAL | Status: DC
  Administered 2017-06-08 – 2017-06-10 (×3): 325 mg via ORAL
  Filled 2017-06-08 (×4): qty 1

## 2017-06-08 MED ORDER — SODIUM CHLORIDE 0.9 % IV SOLN
INTRAVENOUS | Status: DC
  Administered 2017-06-08 – 2017-06-09 (×3): via INTRAVENOUS

## 2017-06-08 MED ORDER — INSULIN ASPART 100 UNIT/ML ~~LOC~~ SOLN
0.0000 [IU] | Freq: Three times a day (TID) | SUBCUTANEOUS | Status: DC
  Administered 2017-06-08: 1 [IU] via SUBCUTANEOUS
  Administered 2017-06-09: 2 [IU] via SUBCUTANEOUS
  Filled 2017-06-08: qty 1

## 2017-06-08 NOTE — ED Notes (Signed)
Dinner tray ordered; carb modified diet

## 2017-06-08 NOTE — ED Provider Notes (Signed)
[facility] EMERGENCY DEPARTMENT Provider Note   CSN: [id number] Arrival date & time: 06/08/17  1021     History   Chief Complaint Chief Complaint  Patient presents with  . Loss of Consciousness    HPI CAY KATH is a 67 y.o. female with a hx of tobacco abuse, iron deficiency anemia, DM, GERD, HTN, hyperlipidemia, stroke, PVD, blindness, and prior syncope and collapse with loop recorder in place who arrives to the ED via EMS status post likely syncopal episode which occurred just prior to arrival.  Per family member patient transitioned from sitting to standing to ambulate to the bathroom, she made it about 2-3 steps, prior to collapsing.  Her niece states that she did not necessarily lose consciousness, she became very weak and somewhat out of it this lasted a few minutes. She was lowered to the floor by an additional family member, therefore she did not have any head injury.  Patient does not really recall what exactly happened, she was not having any lightheadedness, dizziness, chest pain, or dyspnea prior to this event or at present.  Upon EMS arrival patient was hypotensive with blood pressure of 84/43, given 500 mL of NS without significant change.  She has a hx of similar with most recent being in 2018- she had loop monitor inserted by Dr. Sallyanne Kuster due to multiple syncopal episodes, this is first syncope since insertion. No recent new medications. No decreased PO intake.   HPI  Past Medical History:  Diagnosis Date  . Asthma    No probnlems recently  . 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 Diabetes mellitus    Type II per Dr [name]  (patient said type I)  . Fibroid   . GERD (gastroesophageal reflux disease)   . Glaucoma   . Hyperlipidemia   . Hypertension   . Iron deficiency anemia 03/09/2016  . Peripheral vascular disease ([place])   . Pneumonia 2006  . Shortness of breath dyspnea    with exdrtion,  "Walkling too fast"  . Stroke [facility])    no residual    Patient Active Problem List   Diagnosis Date Noted  . CRI (chronic renal insufficiency), stage 3 (moderate) ([place]) 05/01/2016  . History of TIA (transient ischemic attack) 05/01/2016  . Blind 05/01/2016  . Iron deficiency anemia 03/09/2016  . Syncope and collapse 01/16/2016  . Dysphagia, pharyngoesophageal phase 05/05/2014  . Insulin dependent diabetes mellitus with complications ([place]) 30/86/5784  . Essential hypertension 10/06/2012  . Dyslipidemia 09/22/2012  . Peripheral vascular disease ([place]) 09/01/2012    Past Surgical History:  Procedure Laterality Date  . ABDOMINAL HYSTERECTOMY    . CERVICAL FUSION     with graft from hip  . COLONOSCOPY  July 09, 2012  . DIRECT LARYNGOSCOPY N/A 06/07/2014   Procedure: DIRECT LARYNGOSCOPY with BIOPSY and EXCISION VOLLECULAR CYST;  Surgeon: Ruby Cola, MD;  Location: [facility];  Service: ENT;  Laterality: N/A;  . LOOP RECORDER INSERTION N/A 06/20/2016   Procedure: Loop Recorder Insertion;  Surgeon: [name], MD;  Location: [facility];  Service: Cardiovascular;  Laterality: N/A;  . REFRACTIVE SURGERY Bilateral    both eyes  . SPINE SURGERY     lumbar     OB History    Gravida  1   Para  1   Term  0   Preterm  0   AB  0   Living  1     SAB  0   TAB  0   Ectopic  0   Multiple  0   Live Births  0            Home Medications    Prior to Admission medications   Medication Sig Start Date End Date Taking? Authorizing Provider  amLODipine (NORVASC) 10 MG tablet Take 10 mg by mouth daily.  05/19/15   [provider]  aspirin 325 MG EC tablet Take 325 mg by mouth 2 (two) times daily.     [provider]  atorvastatin (LIPITOR) 20 MG tablet TAKE 1 TABLET BY MOUTH ONCE DAILY 05/29/17   Elayne Snare, MD  cilostazol (PLETAL) 100 MG tablet TAKE 1 TABLET BY MOUTH TWICE DAILY 02/02/17   Angelia Mould, MD  doxycycline (MONODOX) 100 MG capsule  TAKE 1 CAPSULE BY MOUTH TWICE DAILY 04/22/17   Elayne Snare, MD  gabapentin (NEURONTIN) 100 MG capsule Take 1 capsule (100 mg total) by mouth 2 (two) times daily. 12/24/16   Elayne Snare, MD  glucose blood (FREESTYLE LITE) test strip Use as instructed to check blood sugar 2 times a day dx code E11.9 03/05/14   Elayne Snare, MD  indapamide (LOZOL) 1.25 MG tablet TAKE 1 TABLET BY MOUTH ONCE DAILY 05/23/17   Elayne Snare, MD  insulin detemir (LEVEMIR) 100 UNIT/ML injection Inject 20 Units into the skin every morning.    [provider]  insulin regular (NOVOLIN R,HUMULIN R) 100 units/mL injection Inject 5-10 Units into the skin See admin instructions. 10 units in the morning and 5-7 units in the evening.    [provider]  IRON PO Take 1 tablet by mouth daily.     [provider]  lisinopril (PRINIVIL,ZESTRIL) 40 MG tablet Take 40 mg by mouth daily.  05/11/15   [provider]  metFORMIN (GLUCOPHAGE-XR) 750 MG 24 hr tablet Take 1 tablet (750 mg total) by mouth daily. 05/09/17   Elayne Snare, MD  omeprazole (PRILOSEC) 20 MG capsule TAKE ONE CAPSULE BY MOUTH ONCE DAILY (DUE FOR OFFICE VISIT THIS MONTH). 04/23/17   Elayne Snare, MD  Vitamin D, Ergocalciferol, (DRISDOL) 50000 units CAPS capsule TAKE ONE CAPSULE BY MOUTH EVERY 7 DAYS 12/28/16   Elayne Snare, MD    Family History Family History  Problem Relation Age of Onset  . Cancer Mother   . Heart disease Mother   . Diabetes Father   . Cancer Brother   . Cancer Brother   . Throat cancer Brother     Social History Social History   Tobacco Use  . Smoking status: Current Every Day Smoker    Packs/day: 1.00    Years: 30.00    Pack years: 30.00    Types: Cigarettes  . Smokeless tobacco: Never Used  Substance Use Topics  . Alcohol use: Yes    Comment: socially  . Drug use: No     Allergies   Morphine and related and Penicillins   Review of Systems Review of Systems  Constitutional: Negative for chills,  diaphoresis and fever.  Respiratory: Negative for shortness of breath.   Cardiovascular: Negative for chest pain, palpitations and leg swelling.  Gastrointestinal: Negative for abdominal pain, diarrhea, nausea and vomiting.  Neurological: Positive for syncope (?). Negative for dizziness, speech difficulty, weakness, light-headedness and numbness.     Physical Exam Updated Vital Signs BP (!) 96/54   Pulse 77   Temp 97.8 F (36.6 C) (Oral)  Resp 20   SpO2 95%   Physical Exam  Constitutional: She appears well-developed and well-nourished. No distress.  HENT:  Head: Normocephalic and atraumatic.  Eyes: Pupils are equal, round, and reactive to light.  Neck: Normal range of motion. Neck supple. No spinous process tenderness present.  Cardiovascular: Normal rate and regular rhythm.  No murmur heard. Pulses:      Radial pulses are 2+ on the right side, and 2+ on the left side.  Pulmonary/Chest: Effort normal and breath sounds normal. No respiratory distress. She has no wheezes. She has no rhonchi. She has no rales.  Abdominal: Soft. She exhibits no distension. There is no tenderness.  Neurological: She is alert.  Clear speech. Patient is blind. CN III-XII otherwise grossly intact. Sensation grossly intact to bilateral upper/lower extremities. 5/5 symmetric grip strength. 5/5 symmetric strength with plantar/dorsiflexion bilaterally. Negative pronator drift.   Skin: Skin is warm and dry. No rash noted.  Psychiatric: She has a normal mood and affect. Her behavior is normal.  Nursing note and vitals reviewed.   ED Treatments / Results  Labs Results for orders placed or performed during the hospital encounter of 75/10/25  Basic metabolic panel  Result Value Ref Range   Sodium 137 135 - 145 mmol/L   Potassium 4.6 3.5 - 5.1 mmol/L   Chloride 109 101 - 111 mmol/L   CO2 19 (L) 22 - 32 mmol/L   Glucose, Bld 122 (H) 65 - 99 mg/dL   BUN 20 6 - 20 mg/dL   Creatinine, Ser 1.66 (H) 0.44 -  1.00 mg/dL   Calcium 9.0 8.9 - 10.3 mg/dL   GFR calc non Af Amer 32 (L) >60 mL/min   GFR calc Af Amer 37 (L) >60 mL/min   Anion gap 9 5 - 15  CBC  Result Value Ref Range   WBC 22.3 (H) 4.0 - 10.5 K/uL   RBC 3.70 (L) 3.87 - 5.11 MIL/uL   Hemoglobin 9.5 (L) 12.0 - 15.0 g/dL   HCT 30.2 (L) 36.0 - 46.0 %   MCV 81.6 78.0 - 100.0 fL   MCH 25.7 (L) 26.0 - 34.0 pg   MCHC 31.5 30.0 - 36.0 g/dL   RDW 16.2 (H) 11.5 - 15.5 %   Platelets 299 150 - 400 K/uL  CBG monitoring, ED  Result Value Ref Range   Glucose-Capillary 125 (H) 65 - 99 mg/dL  I-stat troponin, ED  Result Value Ref Range   Troponin i, poc 0.00 0.00 - 0.08 ng/mL   Comment 3           No results found. EKG EKG Interpretation  Date/Time:  Saturday Jun 08 2017 10:27:25 EDT Ventricular Rate:  75 PR Interval:    QRS Duration: 69 QT Interval:  400 QTC Calculation: 447 R Axis:   9 Text Interpretation:  Sinus rhythm Anteroseptal infarct, old Minimal ST depression, lateral leads similar in appearance to prior.  Confirmed by Zenovia Jarred [phone]) on 06/08/2017 11:15:25 AM   Radiology No results found.  Procedures Procedures (including critical care time)  Medications Ordered in ED Medications  sodium chloride 0.9 % bolus 500 mL (0 mLs Intravenous Stopped 06/08/17 1214)     Initial Impression / Assessment and Plan / ED Course  I have reviewed the triage vital signs and the nursing notes.  Pertinent labs & imaging results that were available during my care of the patient were reviewed by me and considered in my medical decision making (see chart for details).  Patient presents with likely syncopal episode.  Patient nontoxic-appearing, in no apparent distress.  Upon arrival to the emergency department patient's blood pressure has improved, while I am in the room and is 119/51.  She has a fairly benign initial physical exam.  Syncope work-up initiated.  Will obtain [employer] information regarding loop  recorder.  Patient's labs notable for nonspecific leukocytosis at 22.3.  Hemoglobin of 9.5, somewhat decreased from previous 10.1.  Hematocrit 30.2 somewhat decreased from previous 31.4.  Her creatinine is elevated at 1.66 last on record is 1.54.  Initial troponin negative.  EKG appears fairly similar to previous, low suspicion for ACS at this time.  Loop recorder report without any events/obersvations.   RN informed me that while changing patient's diaper he noted some blood in her stool.  Rectal exam performed by me at bedside reveals somewhat mucousy melanous stool.  Fecal occult blood positive. No hemorrhoid or fissure noted on exam. RN Vaughan Sine present as chaperone. Discussed with patient who is blind and is unsure if this is been present previously, her niece who she lives with her is at the bedside and states that she has not noticed any blood in the patient's stool. Patient reports her last colonoscopy was 1 year prior, she had 2 polyps removed, she did have an upper study at this time that was benign from her understanding. She denies increase in NSAIDs or EtOH. No recent vomiting. Denies abdominal pain, abdomen remains nontender.   Given syncopal episode with potentially new melena stool in the emergency department will admit for observation. Findings and plan of care discussed with supervising physician Dr. Thomasene Lot who is in agreement with plan.   14:21: CONSULT: Discussed case with hospitalist Dr. Herbert Moors who accepts admission. Requesting GI be made aware of the patient.   14:47: CONSULT: Discussed with gastroenterologist Dr. Paulita Fujita who has been made aware of the patient.    Final Clinical Impressions(s) / ED Diagnoses   Final diagnoses:  Syncope, unspecified syncope type  Melena    ED Discharge Orders    None       Amaryllis Dyke, PA-C 06/08/17 1453    Macarthur Critchley, MD 06/09/17 [phone]

## 2017-06-08 NOTE — ED Triage Notes (Signed)
Pt arrives via [facility], pt was at a nursing facility visiting her family member, the last she recalls is walking to the restroom, pt had syncopal episode. States she felt fine before. Pt a/ox4, nad. bp 84/43 with EMS, 537ml ns bolus given with no improvement to bp.

## 2017-06-08 NOTE — Progress Notes (Signed)
*  PRELIMINARY RESULTS* Vascular Ultrasound Carotid Duplex (Doppler) has been completed.   Findings suggest 1-39% internal carotid artery stenosis bilaterally. Vertebral arteries are patent with antegrade flow.  06/08/2017 6:33 PM Maudry Mayhew, BS, RVT, RDCS, RDMS

## 2017-06-08 NOTE — ED Notes (Signed)
Will attempt to obtain urine when pt returns from [place].

## 2017-06-08 NOTE — H&P (Deleted)
History and Physical    Julie Brewer QPR:[id number] DOB: Jun 24, 1950 DOA: 06/08/2017   PCP: [name], MD   Patient coming from:  Home    Chief Complaint: Syncopal episode  HPI: Julie Brewer is a 67 y.o. 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  Per family member report, the patient transition from sitting to standing to ambulate to the bathroom, she made about 2-3 steps, prior to collapsing.  Patient reports losing consciousness, unable to tell when she woke up, it is not that it must of been a minute or 2.  The patient did not hit the floor, was caught by the family member, no head trauma.  She had episodes of syncope in the past, but after loop recorded being placed, no evidence have been until today.  Upon waking up, the patient denies any lightheadedness, dizziness, chest pain or palpitations, shortness of breath or cough.  Of note, she had episodes of dry heaves.  The patient is blind, thus unable to see if she had any recent dark stools.  She denies any abdominal pain, cramping.  She denies any recent infections other than using doxycycline for recent foot ulcer, which has completely healed per office note.  She had a colonoscopy several years ago, remarkable for a couple of polyps, but she denies any "feeling of active bleed ", or hemorrhoids.  Patient denies any significant amount of increase in NSAIDs.  She denies any alcohol.  She continues to smoke.  No recreational drug use.  She denies excessive thirst, but she has been drinking significant amount of fluids over the last few days.  She is  compliant with her medications, last taking her pressure meds this morning.  She denies any lower extremity swelling or calf pain.  She denies any history of PE or DVT.  She denies any fever, chills or night sweats.  No confusion was reported.  She denies any history of seizures.    ED Course:  BP (!) 146/64   Pulse (!) 102   Temp 97.8 F (36.6 C) (Oral)   Resp 16   SpO2 98%   Blood pressure on presentation was 84/43, given 500 mL of normal saline with good response.  Currently, her BP is 149/70.  Orthostatic were taken on presentation. Troponin was 0 ,EKG shows Sinus rhythm Anteroseptal infarct, old Minimal ST depression, lateral leads similar in appearance to prior. Loop recorder interrogation reported to be working well. Hemoglobin was noted to be 9.5, essentially at baseline.  Of note, the patient is being followed at the cancer center for anemia, recently having had an anemia panel on March 2019.  With iron 47, TIBC 176, ferritin 124, percentage saturation 27.  Her white count was noted to be 22.3  she also has alpha thalassemia trait per molecular testing on 05/18/2016. Her last colonoscopy was 2014, at which time 2 sessile polyps were found in the rectum, which were removed, one was hyperplastic, without malignancy or adenomyomatosis change Sodium 137, potassium 4.6, bicarb 19, glucose 122, creatinine 1.66 at baseline  Review of Systems:  As per HPI otherwise all other systems reviewed and are negative  Past Medical  History:  Diagnosis Date  . Asthma    No probnlems recently  . 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 Diabetes mellitus    Type II per Dr [name]  (patient said type I)  . Fibroid   . GERD (gastroesophageal reflux disease)   . Glaucoma   . Hyperlipidemia   . Hypertension   . Iron deficiency anemia 03/09/2016  . Peripheral vascular disease ([place])   . Pneumonia 2006  . Shortness of breath dyspnea    with exdrtion,  "Walkling too fast"  . Stroke [facility])    no residual    Past Surgical History:  Procedure Laterality Date  . ABDOMINAL HYSTERECTOMY    . CERVICAL FUSION     with graft from hip  . COLONOSCOPY  July 09, 2012  . DIRECT LARYNGOSCOPY N/A 06/07/2014   Procedure: DIRECT LARYNGOSCOPY with BIOPSY and EXCISION VOLLECULAR CYST;  Surgeon: Ruby Cola, MD;  Location: [place];  Service: ENT;  Laterality: N/A;  . LOOP RECORDER INSERTION N/A 06/20/2016   Procedure: Loop Recorder Insertion;  Surgeon: [name], MD;  Location: [place] [facility];  Service: Cardiovascular;  Laterality: N/A;  . REFRACTIVE SURGERY Bilateral    both eyes  . SPINE SURGERY     lumbar    Social History Social History   Socioeconomic History  . Marital status: Legally Separated    Spouse name: Not on file  . Number of children: Not on file  . Years of education: Not on file  . Highest education level: Not on file  Occupational History  . Not on file  Social Needs  . Financial resource strain: Not on file  . Food insecurity:    Worry: Not on file    Inability: Not on file  . Transportation needs:    Medical: Not on file    Non-medical: Not on file  Tobacco Use  . Smoking status: Current Every Day Smoker    Packs/day: 1.00    Years: 30.00    Pack years: 30.00    Types: Cigarettes  . Smokeless tobacco: Never Used  Substance and Sexual Activity  . Alcohol use: Yes    Comment: socially  . Drug use: No  . Sexual activity: Never    Birth control/protection: Post-menopausal, Surgical    Comment: Hysterectomy  Lifestyle  . Physical activity:    Days per week: Not on file    Minutes per session: Not on file  . Stress: Not on file  Relationships  . Social connections:    Talks on phone: Not on file    Gets together: Not on file    Attends religious service: Not on file    Active member of club or organization: Not on file    Attends meetings of clubs or organizations: Not on file    Relationship  status: Not on file  . Intimate partner violence:    Fear of current or ex partner: Not on file    Emotionally abused: Not on file    Physically abused: Not on file    Forced sexual activity: Not on file  Other Topics Concern  . Not on file  Social History Narrative  . Not on file     Allergies  Allergen Reactions  . Morphine And Related Other (See Comments)    Hallucenations   . Penicillins Rash and Other (See Comments)    Swelling    Family  History  Problem Relation Age of Onset  . Cancer Mother   . Heart disease Mother   . Diabetes Father   . Cancer Brother   . Cancer Brother   . Throat cancer Brother       Prior to Admission medications   Medication Sig Start Date End Date Taking? Authorizing Provider  amLODipine (NORVASC) 10 MG tablet Take 10 mg by mouth daily.  05/19/15  Yes [provider]  aspirin 325 MG EC tablet Take 325 mg by mouth 2 (two) times daily.    Yes [provider]  atorvastatin (LIPITOR) 20 MG tablet TAKE 1 TABLET BY MOUTH ONCE DAILY 05/29/17  Yes Elayne Snare, MD  cilostazol (PLETAL) 100 MG tablet TAKE 1 TABLET BY MOUTH TWICE DAILY 02/02/17  Yes Angelia Mould, MD  gabapentin (NEURONTIN) 100 MG capsule Take 1 capsule (100 mg total) by mouth 2 (two) times daily. 12/24/16  Yes Elayne Snare, MD  glucose blood (FREESTYLE LITE) test strip Use as instructed to check blood sugar 2 times a day dx code E11.9 03/05/14  Yes Elayne Snare, MD  indapamide (LOZOL) 1.25 MG tablet TAKE 1 TABLET BY MOUTH ONCE DAILY 05/23/17  Yes Elayne Snare, MD  insulin detemir (LEVEMIR) 100 UNIT/ML injection Inject 18 Units into the skin every morning.    Yes [provider]  insulin regular (NOVOLIN R,HUMULIN R) 100 units/mL injection Inject 5-10 Units into the skin See admin instructions. 10 units in the morning and 5-7 units in the evening.   Yes [provider]  IRON PO Take 1 tablet by mouth daily.    Yes [provider]  lisinopril  (PRINIVIL,ZESTRIL) 40 MG tablet Take 40 mg by mouth daily.  05/11/15  Yes [provider]  metFORMIN (GLUCOPHAGE-XR) 750 MG 24 hr tablet Take 1 tablet (750 mg total) by mouth daily. 05/09/17  Yes Elayne Snare, MD  omeprazole (PRILOSEC) 20 MG capsule TAKE ONE CAPSULE BY MOUTH ONCE DAILY (DUE FOR OFFICE VISIT THIS MONTH). 04/23/17  Yes Elayne Snare, MD  Vitamin D, Ergocalciferol, (DRISDOL) 50000 units CAPS capsule TAKE ONE CAPSULE BY MOUTH EVERY 7 DAYS 12/28/16  Yes Elayne Snare, MD  doxycycline (MONODOX) 100 MG capsule TAKE 1 CAPSULE BY MOUTH TWICE DAILY Patient not taking: Reported on 06/08/2017 04/22/17   Elayne Snare, MD    Physical Exam:  Vitals:   06/08/17 1400 06/08/17 1445 06/08/17 1500 06/08/17 1515  BP: (!) 149/70 (!) 150/66 (!) 149/66 (!) 146/64  Pulse: 99 99 96 (!) 102  Resp: (!) 21 18 18 16   Temp:      TempSrc:      SpO2: 97% 96% 97% 98%   Constitutional: NAD, calm, mildly anxious Eyes: Legally blind , lids and conjunctivae normal ENMT: Mucous membranes are dry, without exudate or lesions  Neck: normal, supple, no masses, no thyromegaly Respiratory: clear to auscultation bilaterally, no wheezing, no crackles. Normal respiratory effort  Cardiovascular: Regular rate and rhythm,  murmur, rubs or gallops. No extremity edema. 2+ pedal pulses. No carotid bruits. L Loop recorder in place Abdomen: Soft, non tender, No hepatosplenomegaly. Bowel sounds positive.  Musculoskeletal: no clubbing / cyanosis. Moves all extremities Skin: no jaundice, No lesions. Hirsutism noted  Neurologic: Sensation intact  Strength equal in all extremities Psychiatric:   Alert and oriented x 3. Anxious      Labs on Admission: I have personally reviewed following labs and imaging studies  CBC: Recent Labs  Lab 06/08/17 1146 06/08/17 1514  WBC 22.3*  17.6*  HGB 9.5* 7.3*  HCT 30.2* 23.5*  MCV 81.6 81.6  PLT 299 213    Basic Metabolic Panel: Recent Labs  Lab 06/08/17 1146  NA 137  K 4.6    CL 109  CO2 19*  GLUCOSE 122*  BUN 20  CREATININE 1.66*  CALCIUM 9.0    GFR: CrCl cannot be calculated (Unknown ideal weight.).  Liver Function Tests: No results for input(s): AST, ALT, ALKPHOS, BILITOT, PROT, ALBUMIN in the last 168 hours. No results for input(s): LIPASE, AMYLASE in the last 168 hours. No results for input(s): AMMONIA in the last 168 hours.  Coagulation Profile: No results for input(s): INR, PROTIME in the last 168 hours.  Cardiac Enzymes: No results for input(s): CKTOTAL, CKMB, CKMBINDEX, TROPONINI in the last 168 hours.  BNP (last 3 results) No results for input(s): PROBNP in the last 8760 hours.  HbA1C: No results for input(s): HGBA1C in the last 72 hours.  CBG: Recent Labs  Lab 06/08/17 1102  GLUCAP 125*    Lipid Profile: No results for input(s): CHOL, HDL, LDLCALC, TRIG, CHOLHDL, LDLDIRECT in the last 72 hours.  Thyroid Function Tests: Recent Labs    06/08/17 1514  TSH 1.022    Anemia Panel: No results for input(s): VITAMINB12, FOLATE, FERRITIN, TIBC, IRON, RETICCTPCT in the last 72 hours.  Urine analysis:    Component Value Date/Time   COLORURINE YELLOW 01/27/2016 1121   APPEARANCEUR CLEAR 01/27/2016 1121   LABSPEC 1.010 03/09/2016 1400   PHURINE 6.0 03/09/2016 1400   PHURINE 6.0 01/27/2016 1121   GLUCOSEU Negative 03/09/2016 1400   HGBUR Trace 03/09/2016 1400   HGBUR NEGATIVE 01/27/2016 1121   BILIRUBINUR Negative 03/09/2016 1400   KETONESUR Negative 03/09/2016 1400   KETONESUR NEGATIVE 01/27/2016 1121   PROTEINUR 30 03/09/2016 1400   PROTEINUR 30 (A) 12/29/2008 1755   UROBILINOGEN 0.2 03/09/2016 1400   NITRITE Negative 03/09/2016 1400   NITRITE NEGATIVE 01/27/2016 1121   LEUKOCYTESUR Negative 03/09/2016 1400    Sepsis Labs: @LABRCNTIP (procalcitonin:4,lacticidven:4) )No results found for this or any previous visit (from the past 240 hour(s)).   Radiological Exams on Admission: Dg Chest Port 1 View  Result Date:  06/08/2017 CLINICAL DATA:  Leukocytosis, weakness and shortness of breath. EXAM: PORTABLE CHEST 1 VIEW COMPARISON:  12/05/2009 and prior radiographs FINDINGS: The cardiomediastinal silhouette is unremarkable. There is no evidence of focal airspace disease, pulmonary edema, suspicious pulmonary nodule/mass, pleural effusion, or pneumothorax. No acute bony abnormalities are identified. IMPRESSION: No active disease. Electronically Signed   By: Margarette Canada M.D.   On: 06/08/2017 15:40    EKG: Independently reviewed. EKG shows Sinus rhythm Anteroseptal infarct, old Minimal ST depression, lateral leads similar in appearance to prior. Assessment/Plan Active Problems:   Guaiac positive stools   Peripheral vascular disease (HCC)   Dyslipidemia   Essential hypertension   Insulin dependent diabetes mellitus with complications (HCC)   Iron deficiency anemia   CRI (chronic renal insufficiency), stage 3 (moderate) (HCC)   History of TIA (transient ischemic attack)   Blind   GERD (gastroesophageal reflux disease)      Syncope, in a patient with a history of same.  Rule out meds, dehydration with hypotension, versus infection.  In addition, the patient has a history of colon polyps in the past, and was found to have Hemoccult positive (see below ). Labs remarkable for elevated white count of 22.3, hemoglobin 9.5, EKG unrevealing.  Troponin negative.  neuro exam normal [facility] syncope score . Orthostatics  neg.  Last syncopal episode 1 year ago prior to loop recorder placement, now with a new event, but with negative interrogation during this admission.  Last 2D echo in 2122, fibro-systolic function, EF 65 to 48%, grade 1 diastolic.  Blood pressure was 80s over 40s on presentation, improving with 2 boluses of IV normal saline.  She is afebrile.  Vital signs are now stable.  UA is pending Telemetry observation Syncope order set  Fall precautions 2 D echo CXR in view of leukocytosis and syncope  IV  fluids Urine and blood culture Check  TSH, UDS  EKG in am Carotid ultrasound I  Hold Beta blockers and other BP meds  Positive Hemoccult, and one episode of melenic stools while at the ER in the setting of syncopal episode, history of iron deficiency anemia and prior colon polyps per colonoscopy in 2014.  BUN normal, hemoglobin 9.5 essentially at baseline.  Will type and screen, check serial CBCs, and transfuse for Hgb less than 8.   IV Pepcid bid (shortage of Protonix) IVF  Hold BP meds NPO for now, may advance to liquid diet Gastroenterology has been consulted to EDP Hold NSAIDs  Anemia of chronic disease and Iron deficiency , followed by [facility] Benay Spice [facility], last seen in March 2019   iron 47, TIBC 176, ferritin 124, percentage saturation 27.  she also has alpha thalassemia trait per molecular testing on 05/18/2016. Serial CBC  No transfusion is indicated at this time unless Hb less than 8  Continue Iron supplements   Type II Diabetes with neuropathy Current blood sugar level is 122 Lab Results  Component Value Date   HGBA1C 5.5 03/21/2017  Hold home oral diabetic medications.  Levemir  , SSI Continue Neurontin   Hypertension BP (!) 146/64   Pulse (!) 102   Temp 97.8 F (36.6 C) (Oral)   Resp 16   SpO2 98%  Hold  home anti-hypertensive medications  Resume BP meds if no bleeding and BP normalizes in am    Hyperlipidemia Continue home statins  GERD, no acute symptoms Continue PPI    Chronic kidney disease stage    baseline creatinine  1.3-1.5   Current Cr is 1.66 Lab Results  Component Value Date   CREATININE 1.66 (H) 06/08/2017   CREATININE 1.54 (H) 04/25/2017   CREATININE 1.35 (H) 03/21/2017  IVF Hold  ACEI Repeat BMET in am Hold NSAIDS   DVT prophylaxis:  SCD  Code Status:   Full   Family Communication:  Discussed with patient Disposition Plan: Expect patient to be discharged to home after condition improves Consults called:    GI by EDP   Admission status: Tele Obs    Sharene Butters, PA-C [facility]   Amion text  [phone]   06/08/2017, 5:30 PM

## 2017-06-08 NOTE — ED Notes (Signed)
PAGED ADMITTING TO RN  

## 2017-06-08 NOTE — ED Notes (Signed)
PAGED ADMITTING PER RN  

## 2017-06-08 NOTE — ED Notes (Signed)
Pt left for US

## 2017-06-08 NOTE — H&P (Signed)
History and Physical    Julie Brewer MLJ:[id number] DOB: Mar 25, 1950 DOA: 06/08/2017   PCP: [name], MD   Patient coming from:  Home    Chief Complaint: Syncopal episode  HPI: Julie Brewer is a 67 y.o. 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  Per family member report, the patient transition from sitting to standing to ambulate to the bathroom, she made about 2-3 steps, prior to collapsing.  Patient reports losing consciousness, unable to tell when she woke up, it is not that it must of been a minute or 2.  The patient did not hit the floor, was caught by the family member, no head trauma.  She had episodes of syncope in the past, but after loop recorded being placed, no evidence have been until today.  Upon waking up, the patient denies any lightheadedness, dizziness, chest pain or palpitations, shortness of breath or cough.  Of note, she had episodes of dry heaves.  The patient is blind, thus unable to see if she had any recent dark stools.  She denies any abdominal pain, cramping.  She denies any recent infections other than using doxycycline for recent foot ulcer, which has completely healed per office note.  She had a colonoscopy several years ago, remarkable for a couple of polyps, but she denies any "feeling of active bleed ", or hemorrhoids.  Patient denies any significant amount of increase in NSAIDs.  She denies any alcohol.  She continues to smoke.  No recreational drug use.  She denies excessive thirst, but she has been drinking significant amount of fluids over the last few days.  She is  compliant with her medications, last taking her pressure meds this morning.  She denies any lower extremity swelling or calf pain.  She denies any history of PE or DVT.  She denies any fever, chills or night sweats.  No confusion was reported.  She denies any history of seizures.    ED Course:  BP (!) 149/70   Pulse 99   Temp 97.8 F (36.6 C) (Oral)   Resp (!) 21   SpO2 97%   Blood pressure on presentation was 84/43, given 500 mL of normal saline with good response.  Currently, her BP is 149/70.  Orthostatic were taken on presentation. Troponin was 0 ,EKG shows Sinus rhythm Anteroseptal infarct, old Minimal ST depression, lateral leads similar in appearance to prior. Loop recorder interrogation reported to be working well. Hemoglobin was noted to be 9.5, essentially at baseline.  Of note, the patient is being followed at the cancer center for anemia, recently having had an anemia panel on March 2019.  With iron 47, TIBC 176, ferritin 124, percentage saturation 27.  Her white count was noted to be 22.3  she also has alpha thalassemia trait per molecular testing on 05/18/2016. Her last colonoscopy was 2014, at which time 2 sessile polyps were found in the rectum, which were removed, one was hyperplastic, without malignancy or adenomyomatosis change Sodium 137, potassium 4.6, bicarb 19, glucose 122, creatinine 1.66 at baseline  Review of Systems:  As per HPI otherwise all other systems reviewed and are negative  Past Medical  History:  Diagnosis Date  . Asthma    No probnlems recently  . 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 Diabetes mellitus    Type II per Dr [name]  (patient said type I)  . Fibroid   . GERD (gastroesophageal reflux disease)   . Glaucoma   . Hyperlipidemia   . Hypertension   . Iron deficiency anemia 03/09/2016  . Peripheral vascular disease ([place])   . Pneumonia 2006  . Shortness of breath dyspnea    with exdrtion,  "Walkling too fast"  . Stroke [facility])    no residual    Past Surgical History:  Procedure Laterality Date  . ABDOMINAL HYSTERECTOMY    . CERVICAL FUSION     with graft from hip  . COLONOSCOPY  July 09, 2012  . DIRECT LARYNGOSCOPY N/A 06/07/2014   Procedure: DIRECT LARYNGOSCOPY with BIOPSY and EXCISION VOLLECULAR CYST;  Surgeon: Ruby Cola, MD;  Location: [facility];  Service: ENT;  Laterality: N/A;  . LOOP RECORDER INSERTION N/A 06/20/2016   Procedure: Loop Recorder Insertion;  Surgeon: [name], MD;  Location: [facility];  Service: Cardiovascular;  Laterality: N/A;  . REFRACTIVE SURGERY Bilateral    both eyes  . SPINE SURGERY     lumbar    Social History Social History   Socioeconomic History  . Marital status: Legally Separated    Spouse name: Not on file  . Number of children: Not on file  . Years of education: Not on file  . Highest education level: Not on file  Occupational History  . Not on file  Social Needs  . Financial resource strain: Not on file  . Food insecurity:    Worry: Not on file    Inability: Not on file  . Transportation needs:    Medical: Not on file    Non-medical: Not on file  Tobacco Use  . Smoking status: Current Every Day Smoker    Packs/day: 1.00    Years: 30.00    Pack years: 30.00    Types: Cigarettes  . Smokeless tobacco: Never Used  Substance and Sexual Activity  . Alcohol use: Yes    Comment: socially  . Drug use: No  . Sexual activity: Never    Birth control/protection: Post-menopausal, Surgical    Comment: Hysterectomy  Lifestyle  . Physical activity:    Days per week: Not on file    Minutes per session: Not on file  . Stress: Not on file  Relationships  . Social connections:    Talks on phone: Not on file    Gets together: Not on file    Attends religious service: Not on file    Active member of club or organization: Not on file    Attends meetings of clubs or organizations: Not on file    Relationship  status: Not on file  . Intimate partner violence:    Fear of current or ex partner: Not on file    Emotionally abused: Not on file    Physically abused: Not on file    Forced sexual activity: Not on file  Other Topics Concern  . Not on file  Social History Narrative  . Not on file     Allergies  Allergen Reactions  . Morphine And Related Other (See Comments)    Hallucenations   . Penicillins Rash and Other (See Comments)    Swelling    Family  History  Problem Relation Age of Onset  . Cancer Mother   . Heart disease Mother   . Diabetes Father   . Cancer Brother   . Cancer Brother   . Throat cancer Brother       Prior to Admission medications   Medication Sig Start Date End Date Taking? Authorizing Provider  amLODipine (NORVASC) 10 MG tablet Take 10 mg by mouth daily.  05/19/15  Yes [provider]  aspirin 325 MG EC tablet Take 325 mg by mouth 2 (two) times daily.    Yes [provider]  atorvastatin (LIPITOR) 20 MG tablet TAKE 1 TABLET BY MOUTH ONCE DAILY 05/29/17  Yes Elayne Snare, MD  cilostazol (PLETAL) 100 MG tablet TAKE 1 TABLET BY MOUTH TWICE DAILY 02/02/17  Yes Angelia Mould, MD  gabapentin (NEURONTIN) 100 MG capsule Take 1 capsule (100 mg total) by mouth 2 (two) times daily. 12/24/16  Yes Elayne Snare, MD  glucose blood (FREESTYLE LITE) test strip Use as instructed to check blood sugar 2 times a day dx code E11.9 03/05/14  Yes Elayne Snare, MD  indapamide (LOZOL) 1.25 MG tablet TAKE 1 TABLET BY MOUTH ONCE DAILY 05/23/17  Yes Elayne Snare, MD  insulin detemir (LEVEMIR) 100 UNIT/ML injection Inject 18 Units into the skin every morning.    Yes [provider]  insulin regular (NOVOLIN R,HUMULIN R) 100 units/mL injection Inject 5-10 Units into the skin See admin instructions. 10 units in the morning and 5-7 units in the evening.   Yes [provider]  IRON PO Take 1 tablet by mouth daily.    Yes [provider]  lisinopril  (PRINIVIL,ZESTRIL) 40 MG tablet Take 40 mg by mouth daily.  05/11/15  Yes [provider]  metFORMIN (GLUCOPHAGE-XR) 750 MG 24 hr tablet Take 1 tablet (750 mg total) by mouth daily. 05/09/17  Yes Elayne Snare, MD  omeprazole (PRILOSEC) 20 MG capsule TAKE ONE CAPSULE BY MOUTH ONCE DAILY (DUE FOR OFFICE VISIT THIS MONTH). 04/23/17  Yes Elayne Snare, MD  Vitamin D, Ergocalciferol, (DRISDOL) 50000 units CAPS capsule TAKE ONE CAPSULE BY MOUTH EVERY 7 DAYS 12/28/16  Yes Elayne Snare, MD  doxycycline (MONODOX) 100 MG capsule TAKE 1 CAPSULE BY MOUTH TWICE DAILY Patient not taking: Reported on 06/08/2017 04/22/17   Elayne Snare, MD    Physical Exam:  Vitals:   06/08/17 1315 06/08/17 1330 06/08/17 1345 06/08/17 1400  BP: (!) 153/66 (!) 153/73 137/74 (!) 149/70  Pulse: 98 92 97 99  Resp: (!) 21 19 18  (!) 21  Temp:      TempSrc:      SpO2: 97% 98% 98% 97%   Constitutional: NAD, calm, mildly anxious Eyes: Legally blind , lids and conjunctivae normal ENMT: Mucous membranes are dry, without exudate or lesions  Neck: normal, supple, no masses, no thyromegaly Respiratory: clear to auscultation bilaterally, no wheezing, no crackles. Normal respiratory effort  Cardiovascular: Regular rate and rhythm,  murmur, rubs or gallops. No extremity edema. 2+ pedal pulses. No carotid bruits. L Loop recorder in place Abdomen: Soft, non tender, No hepatosplenomegaly. Bowel sounds positive.  Musculoskeletal: no clubbing / cyanosis. Moves all extremities Skin: no jaundice, No lesions. Hirsutism noted  Neurologic: Sensation intact  Strength equal in all extremities Psychiatric:   Alert and oriented x 3. Anxious      Labs on Admission: I have personally reviewed following labs and imaging studies  CBC: Recent Labs  Lab 06/08/17 1146  WBC 22.3*  HGB 9.5*  HCT 30.2*  MCV 81.6  PLT 960    Basic Metabolic Panel: Recent Labs  Lab 06/08/17 1146  NA 137  K 4.6  CL 109  CO2 19*  GLUCOSE 122*  BUN 20    CREATININE 1.66*  CALCIUM 9.0    GFR: CrCl cannot be calculated (Unknown ideal weight.).  Liver Function Tests: No results for input(s): AST, ALT, ALKPHOS, BILITOT, PROT, ALBUMIN in the last 168 hours. No results for input(s): LIPASE, AMYLASE in the last 168 hours. No results for input(s): AMMONIA in the last 168 hours.  Coagulation Profile: No results for input(s): INR, PROTIME in the last 168 hours.  Cardiac Enzymes: No results for input(s): CKTOTAL, CKMB, CKMBINDEX, TROPONINI in the last 168 hours.  BNP (last 3 results) No results for input(s): PROBNP in the last 8760 hours.  HbA1C: No results for input(s): HGBA1C in the last 72 hours.  CBG: Recent Labs  Lab 06/08/17 1102  GLUCAP 125*    Lipid Profile: No results for input(s): CHOL, HDL, LDLCALC, TRIG, CHOLHDL, LDLDIRECT in the last 72 hours.  Thyroid Function Tests: No results for input(s): TSH, T4TOTAL, FREET4, T3FREE, THYROIDAB in the last 72 hours.  Anemia Panel: No results for input(s): VITAMINB12, FOLATE, FERRITIN, TIBC, IRON, RETICCTPCT in the last 72 hours.  Urine analysis:    Component Value Date/Time   COLORURINE YELLOW 01/27/2016 1121   APPEARANCEUR CLEAR 01/27/2016 1121   LABSPEC 1.010 03/09/2016 1400   PHURINE 6.0 03/09/2016 1400   PHURINE 6.0 01/27/2016 1121   GLUCOSEU Negative 03/09/2016 1400   HGBUR Trace 03/09/2016 1400   HGBUR NEGATIVE 01/27/2016 1121   BILIRUBINUR Negative 03/09/2016 1400   KETONESUR Negative 03/09/2016 1400   KETONESUR NEGATIVE 01/27/2016 1121   PROTEINUR 30 03/09/2016 1400   PROTEINUR 30 (A) 12/29/2008 1755   UROBILINOGEN 0.2 03/09/2016 1400   NITRITE Negative 03/09/2016 1400   NITRITE NEGATIVE 01/27/2016 1121   LEUKOCYTESUR Negative 03/09/2016 1400    Sepsis Labs: @LABRCNTIP (procalcitonin:4,lacticidven:4) )No results found for this or any previous visit (from the past 240 hour(s)).   Radiological Exams on Admission: No results found.  EKG: Independently  reviewed. EKG shows Sinus rhythm Anteroseptal infarct, old Minimal ST depression, lateral leads similar in appearance to prior. Assessment/Plan Active Problems:   Guaiac positive stools   Peripheral vascular disease (HCC)   Dyslipidemia   Essential hypertension   Insulin dependent diabetes mellitus with complications (HCC)   Iron deficiency anemia   CRI (chronic renal insufficiency), stage 3 (moderate) (HCC)   History of TIA (transient ischemic attack)   Blind   GERD (gastroesophageal reflux disease)      Syncope, in a patient with a history of same.  Rule out meds, dehydration with hypotension, versus infection.  In addition, the patient has a history of colon polyps in the past, and was found to have Hemoccult positive (see below ). Labs remarkable for elevated white count of 22.3, hemoglobin 9.5, EKG unrevealing.  Troponin negative.  neuro exam normal [facility] syncope score . Orthostatics neg.  Last syncopal episode 1 year ago prior to loop recorder placement, now with a new event, but with negative interrogation during this admission.  Last 2D echo in 4540, fibro-systolic function, EF 65 to 98%, grade 1 diastolic.  Blood pressure was 80s over 40s on presentation, improving with 2 boluses of IV normal saline.  She is afebrile.  Vital signs are now stable.  UA is pending Telemetry observation Syncope order set  Fall precautions 2 D echo  CXR in view of leukocytosis and syncope  IV fluids Urine and blood culture Check  TSH, UDS  EKG in am Carotid ultrasound I  Hold Beta blockers and other BP meds  Positive Hemoccult, and one episode of melenic stools while at the ER in the setting of syncopal episode, history of iron deficiency anemia and prior colon polyps per colonoscopy in 2014.  BUN normal, hemoglobin 9.5 essentially at baseline.  Will type and screen, check serial CBCs, and transfuse for Hgb less than 8.   IV Pepcid bid (shortage of Protonix) IVF  Hold BP meds NPO for now,  may advance to liquid diet Gastroenterology has been consulted to EDP Hold NSAIDs  Anemia of chronic disease and Iron deficiency , followed by Dr. Benay Spice at the [facility], last seen in March 2019   iron 47, TIBC 176, ferritin 124, percentage saturation 27.  she also has alpha thalassemia trait per molecular testing on 05/18/2016. Serial CBC  No transfusion is indicated at this time unless Hb less than 8  Continue Iron supplements   Type II Diabetes with neuropathy Current blood sugar level is 122 Lab Results  Component Value Date   HGBA1C 5.5 03/21/2017  Hold home oral diabetic medications.  Levemir  , SSI Continue Neurontin   Hypertension BP (!) 149/70   Pulse 99   Temp 97.8 F (36.6 C) (Oral)   Resp (!) 21   SpO2 97%  Hold  home anti-hypertensive medications  Resume BP meds if no bleeding and BP normalizes in am    Hyperlipidemia Continue home statins  GERD, no acute symptoms Continue PPI    Chronic kidney disease stage    baseline creatinine  1.3-1.5   Current Cr is 1.66 Lab Results  Component Value Date   CREATININE 1.66 (H) 06/08/2017   CREATININE 1.54 (H) 04/25/2017   CREATININE 1.35 (H) 03/21/2017  IVF Hold  ACEI Repeat BMET in am Hold NSAIDS   DVT prophylaxis:  SCD  Code Status:   Full   Family Communication:  Discussed with patient Disposition Plan: Expect patient to be discharged to home after condition improves Consults called:    GI by EDP  Admission status: Tele Obs    Sharene Butters, PA-C [facility]   Amion text  [phone]   06/08/2017, 3:07 PM

## 2017-06-08 NOTE — Progress Notes (Signed)
Paged by bedside RN regarding the pts abdomen being soft but distended with some mild tenderness in the LLQ. Pt has also been having some small loose mucousy stools. An abdominal X-Ray was obtained showing "Mild gas distended small and large bowel compatible with ileus." We will make the pt NPO, place an NG Tube to low-intermittent suction, obtain a CT of the abdomen for further evaluation, and consult GI for further treatment recommendations.  Arby Barrette AGPCNP-BC, AGNP-C [facility] Pager [phone]

## 2017-06-09 ENCOUNTER — Observation Stay (HOSPITAL_COMMUNITY): Payer: Medicare HMO

## 2017-06-09 ENCOUNTER — Other Ambulatory Visit (HOSPITAL_COMMUNITY): Payer: Medicare HMO

## 2017-06-09 ENCOUNTER — Inpatient Hospital Stay (HOSPITAL_COMMUNITY): Payer: Medicare HMO

## 2017-06-09 DIAGNOSIS — R55 Syncope and collapse: Secondary | ICD-10-CM

## 2017-06-09 DIAGNOSIS — I10 Essential (primary) hypertension: Secondary | ICD-10-CM

## 2017-06-09 DIAGNOSIS — I361 Nonrheumatic tricuspid (valve) insufficiency: Secondary | ICD-10-CM

## 2017-06-09 DIAGNOSIS — Z794 Long term (current) use of insulin: Secondary | ICD-10-CM

## 2017-06-09 DIAGNOSIS — Z8673 Personal history of transient ischemic attack (TIA), and cerebral infarction without residual deficits: Secondary | ICD-10-CM

## 2017-06-09 DIAGNOSIS — I739 Peripheral vascular disease, unspecified: Secondary | ICD-10-CM

## 2017-06-09 DIAGNOSIS — K921 Melena: Principal | ICD-10-CM

## 2017-06-09 DIAGNOSIS — E118 Type 2 diabetes mellitus with unspecified complications: Secondary | ICD-10-CM

## 2017-06-09 DIAGNOSIS — N183 Chronic kidney disease, stage 3 (moderate): Secondary | ICD-10-CM

## 2017-06-09 DIAGNOSIS — K8020 Calculus of gallbladder without cholecystitis without obstruction: Secondary | ICD-10-CM | POA: Diagnosis not present

## 2017-06-09 DIAGNOSIS — D508 Other iron deficiency anemias: Secondary | ICD-10-CM

## 2017-06-09 DIAGNOSIS — Z4682 Encounter for fitting and adjustment of non-vascular catheter: Secondary | ICD-10-CM | POA: Diagnosis not present

## 2017-06-09 LAB — CBC
HEMATOCRIT: 31 % — AB (ref 36.0–46.0)
HEMATOCRIT: 30.3 % — AB (ref 36.0–46.0)
HEMOGLOBIN: 9.4 g/dL — AB (ref 12.0–15.0)
Hemoglobin: 9.7 g/dL — ABNORMAL LOW (ref 12.0–15.0)
MCH: 25.5 pg — ABNORMAL LOW (ref 26.0–34.0)
MCH: 25.2 pg — ABNORMAL LOW (ref 26.0–34.0)
MCHC: 31.3 g/dL (ref 30.0–36.0)
MCHC: 31 g/dL (ref 30.0–36.0)
MCV: 81.4 fL (ref 78.0–100.0)
MCV: 81.2 fL (ref 78.0–100.0)
Platelets: 291 10*3/uL (ref 150–400)
Platelets: 288 10*3/uL (ref 150–400)
RBC: 3.81 MIL/uL — ABNORMAL LOW (ref 3.87–5.11)
RBC: 3.73 MIL/uL — AB (ref 3.87–5.11)
RDW: 16.3 % — AB (ref 11.5–15.5)
RDW: 16.2 % — ABNORMAL HIGH (ref 11.5–15.5)
WBC: 16 10*3/uL — ABNORMAL HIGH (ref 4.0–10.5)
WBC: 15.8 10*3/uL — AB (ref 4.0–10.5)

## 2017-06-09 LAB — GLUCOSE, CAPILLARY
Glucose-Capillary: 103 mg/dL — ABNORMAL HIGH (ref 65–99)
Glucose-Capillary: 93 mg/dL (ref 65–99)
Glucose-Capillary: 157 mg/dL — ABNORMAL HIGH (ref 65–99)
Glucose-Capillary: 164 mg/dL — ABNORMAL HIGH (ref 65–99)

## 2017-06-09 LAB — ECHOCARDIOGRAM COMPLETE
Height: 63 in
Weight: 2153.6 oz

## 2017-06-09 LAB — BASIC METABOLIC PANEL
ANION GAP: 8 (ref 5–15)
BUN: 13 mg/dL (ref 6–20)
CHLORIDE: 110 mmol/L (ref 101–111)
CO2: 20 mmol/L — ABNORMAL LOW (ref 22–32)
Calcium: 8.7 mg/dL — ABNORMAL LOW (ref 8.9–10.3)
Creatinine, Ser: 1.34 mg/dL — ABNORMAL HIGH (ref 0.44–1.00)
GFR calc Af Amer: 47 mL/min — ABNORMAL LOW (ref >60)
GFR calc non Af Amer: 41 mL/min — ABNORMAL LOW (ref >60)
Glucose, Bld: 106 mg/dL — ABNORMAL HIGH (ref 65–99)
Potassium: 4.4 mmol/L (ref 3.5–5.1)
Sodium: 138 mmol/L (ref 135–145)

## 2017-06-09 LAB — HIV ANTIBODY (ROUTINE TESTING W REFLEX): HIV Screen 4th Generation wRfx: NONREACTIVE

## 2017-06-09 MED ORDER — AMLODIPINE BESYLATE 10 MG PO TABS
10.0000 mg | ORAL_TABLET | Freq: Every day | ORAL | Status: DC
  Administered 2017-06-09 – 2017-06-10 (×2): 10 mg via ORAL
  Filled 2017-06-09 (×2): qty 1

## 2017-06-09 MED ORDER — IOHEXOL 300 MG/ML  SOLN
100.0000 mL | Freq: Once | INTRAMUSCULAR | Status: AC | PRN
  Administered 2017-06-09: 100 mL via INTRAVENOUS

## 2017-06-09 MED ORDER — SODIUM CHLORIDE 0.9 % IV SOLN: INTRAVENOUS | Status: DC

## 2017-06-09 MED ORDER — LISINOPRIL 40 MG PO TABS
40.0000 mg | ORAL_TABLET | Freq: Every day | ORAL | Status: DC
  Administered 2017-06-09 – 2017-06-10 (×2): 40 mg via ORAL
  Filled 2017-06-09 (×2): qty 1

## 2017-06-09 MED ORDER — BISACODYL 10 MG RE SUPP
10.0000 mg | Freq: Once | RECTAL | Status: AC
  Administered 2017-06-09: 10 mg via RECTAL
  Filled 2017-06-09: qty 1

## 2017-06-09 MED ORDER — INDAPAMIDE 1.25 MG PO TABS
1.2500 mg | ORAL_TABLET | Freq: Every day | ORAL | Status: DC
  Administered 2017-06-09 – 2017-06-10 (×2): 1.25 mg via ORAL
  Filled 2017-06-09 (×2): qty 1

## 2017-06-09 NOTE — Progress Notes (Signed)
  Echocardiogram 2D Echocardiogram has been performed.  Julie Brewer 06/09/2017, 3:15 PM

## 2017-06-09 NOTE — H&P (View-Only) (Signed)
[facility] Consultation Note  Referring Provider: Dr. Loleta Books [facility]) Primary Care Physician:  [name], MD  Reason for Consultation:  Anemia, syncope  HPI: Julie Brewer is a 67 y.o. female admitted for syncope and anemia and blood in stool.  She has had problems with syncope for awhile, and has had cardiology evaluation for this. 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  She had endoscopy about one year ago by Dr. Amedeo Plenty which showed small hiatal hernia and mild gastritis otherwise unrevealing.  Colonoscopy June 2014 by Dr. Amedeo Plenty showed a couple hyperplastic polyps, otherwise unrevealing.  She has no abdominal pain; was having some retching and xray with dilated loops, and ngt was placed and CT early this morning was done which showed colitis from transverse colon to rectum.  She is no longer nauseated.  Is on Pletal for peripheral vascular disease;    Past Medical History:  Diagnosis Date  . Asthma    No probnlems recently  . 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 Diabetes mellitus    Type II per Dr [name]  (patient said type I)  . Fibroid   . GERD (gastroesophageal reflux disease)   . Glaucoma   . Hyperlipidemia   . Hypertension   . Iron deficiency anemia 03/09/2016  . Peripheral vascular disease ([place])   . Pneumonia 2006  . Shortness of breath dyspnea    with exdrtion, "Walkling too fast"  . Stroke [facility])    no residual    Past Surgical History:  Procedure Laterality Date  . ABDOMINAL HYSTERECTOMY    . CERVICAL FUSION     with graft from hip  . COLONOSCOPY  July 09, 2012  . DIRECT LARYNGOSCOPY N/A 06/07/2014   Procedure: DIRECT LARYNGOSCOPY with BIOPSY and EXCISION VOLLECULAR CYST;  Surgeon: Ruby Cola, MD;  Location: [place];  Service: ENT;  Laterality: N/A;  . LOOP RECORDER INSERTION N/A 06/20/2016   Procedure: Loop Recorder  Insertion;  Surgeon: [name], MD;  Location: [place] [facility];  Service: Cardiovascular;  Laterality: N/A;  . REFRACTIVE SURGERY Bilateral    both eyes  . SPINE SURGERY     lumbar    Prior to Admission medications   Medication Sig Start Date End Date Taking? Authorizing Provider  amLODipine (NORVASC) 10 MG tablet Take 10 mg by mouth daily.  05/19/15  Yes [provider]  aspirin 325 MG EC tablet Take 325 mg by mouth 2 (two) times daily.    Yes [provider]  atorvastatin (LIPITOR) 20 MG tablet TAKE 1 TABLET BY MOUTH ONCE DAILY 05/29/17  Yes Elayne Snare, MD  cilostazol (PLETAL) 100 MG tablet TAKE 1 TABLET BY MOUTH TWICE DAILY 02/02/17  Yes Angelia Mould, MD  gabapentin (NEURONTIN) 100 MG capsule Take 1 capsule (100 mg total) by mouth 2 (two) times daily. 12/24/16  Yes Elayne Snare, MD  glucose blood (FREESTYLE LITE) test strip Use as instructed to check blood sugar 2 times a day dx code E11.9 03/05/14  Yes Elayne Snare, MD  indapamide (LOZOL) 1.25 MG tablet TAKE 1 TABLET BY MOUTH ONCE DAILY 05/23/17  Yes Elayne Snare, MD  insulin detemir (LEVEMIR) 100 UNIT/ML injection Inject 18 Units into the skin every morning.    Yes [provider]  insulin regular (NOVOLIN R,HUMULIN R) 100 units/mL injection Inject 5-10 Units into  the skin See admin instructions. 10 units in the morning and 5-7 units in the evening.   Yes [provider]  IRON PO Take 1 tablet by mouth daily.    Yes [provider]  lisinopril (PRINIVIL,ZESTRIL) 40 MG tablet Take 40 mg by mouth daily.  05/11/15  Yes [provider]  metFORMIN (GLUCOPHAGE-XR) 750 MG 24 hr tablet Take 1 tablet (750 mg total) by mouth daily. 05/09/17  Yes Elayne Snare, MD  omeprazole (PRILOSEC) 20 MG capsule TAKE ONE CAPSULE BY MOUTH ONCE DAILY (DUE FOR OFFICE VISIT THIS MONTH). 04/23/17  Yes Elayne Snare, MD  Vitamin D, Ergocalciferol, (DRISDOL) 50000 units CAPS capsule TAKE ONE CAPSULE BY MOUTH EVERY  7 DAYS 12/28/16  Yes Elayne Snare, MD  doxycycline (MONODOX) 100 MG capsule TAKE 1 CAPSULE BY MOUTH TWICE DAILY Patient not taking: Reported on 06/08/2017 04/22/17   Elayne Snare, MD    Current Facility-Administered Medications  Medication Dose Route Frequency Provider Last Rate Last Dose  . 0.9 %  sodium chloride infusion   Intravenous Continuous Rondel Jumbo, PA-C 100 mL/hr at 06/09/17 1137    . acetaminophen (TYLENOL) tablet 650 mg  650 mg Oral Q6H PRN Rondel Jumbo, PA-C       Or  . acetaminophen (TYLENOL) suppository 650 mg  650 mg Rectal Q6H PRN Rondel Jumbo, PA-C      . atorvastatin (LIPITOR) tablet 20 mg  20 mg Oral Daily Sharene Butters E, PA-C   20 mg at 06/08/17 1641  . famotidine (PEPCID) IVPB 20 mg premix  20 mg Intravenous Q12H Rondel Jumbo, PA-C 100 mL/hr at 06/09/17 1134 20 mg at 06/09/17 1134  . ferrous sulfate tablet 325 mg  325 mg Oral Daily Rondel Jumbo, PA-C   325 mg at 06/08/17 1641  . gabapentin (NEURONTIN) capsule 100 mg  100 mg Oral BID Rondel Jumbo, PA-C   100 mg at 06/08/17 2331  . HYDROcodone-acetaminophen (NORCO/VICODIN) 5-325 MG per tablet 1-2 tablet  1-2 tablet Oral Q4H PRN Rondel Jumbo, PA-C      . insulin aspart (novoLOG) injection 0-9 Units  0-9 Units Subcutaneous TID WC Rondel Jumbo, PA-C   1 Units at 06/08/17 1751  . insulin detemir (LEVEMIR) injection 10 Units  10 Units Subcutaneous BH-q7a Rondel Jumbo, PA-C      . ondansetron [facility]) injection 4 mg  4 mg Intravenous Q6H PRN Hugelmeyer, Alexis, DO   4 mg at 06/08/17 2136    Allergies as of 06/08/2017 - Review Complete 06/08/2017  Allergen Reaction Noted  . Morphine and related Other (See Comments) 08/03/2011  . Penicillins Rash and Other (See Comments) 08/03/2011    Family History  Problem Relation Age of Onset  . Cancer Mother   . Heart disease Mother   . Diabetes Father   . Cancer Brother   . Cancer Brother   . Throat cancer Brother     Social History   Socioeconomic  History  . Marital status: Legally Separated    Spouse name: Not on file  . Number of children: Not on file  . Years of education: Not on file  . Highest education level: Not on file  Occupational History  . Not on file  Social Needs  . Financial resource strain: Not on file  . Food insecurity:    Worry: Not on file    Inability: Not on file  . Transportation needs:    Medical: Not on file  Non-medical: Not on file  Tobacco Use  . Smoking status: Current Every Day Smoker    Packs/day: 1.00    Years: 30.00    Pack years: 30.00    Types: Cigarettes  . Smokeless tobacco: Never Used  Substance and Sexual Activity  . Alcohol use: Yes    Comment: socially  . Drug use: No  . Sexual activity: Never    Birth control/protection: Post-menopausal, Surgical    Comment: Hysterectomy  Lifestyle  . Physical activity:    Days per week: Not on file    Minutes per session: Not on file  . Stress: Not on file  Relationships  . Social connections:    Talks on phone: Not on file    Gets together: Not on file    Attends religious service: Not on file    Active member of club or organization: Not on file    Attends meetings of clubs or organizations: Not on file    Relationship status: Not on file  . Intimate partner violence:    Fear of current or ex partner: Not on file    Emotionally abused: Not on file    Physically abused: Not on file    Forced sexual activity: Not on file  Other Topics Concern  . Not on file  Social History Narrative  . Not on file    Review of Systems: As per HPI, all others negative  Physical Exam: Vital signs in last 24 hours: Temp:  [98.7 F (37.1 C)-99.4 F (37.4 C)] 99.4 F (37.4 C) (05/12 0554) Pulse Rate:  [92-106] 106 (05/12 0554) Resp:  [16-25] 18 (05/12 0554) BP: (132-181)/(57-92) 171/76 (05/12 0554) SpO2:  [95 %-100 %] 95 % (05/12 0554) Weight:  [61.1 kg (134 lb 9.6 oz)-62 kg (136 lb 9.6 oz)] 61.1 kg (134 lb 9.6 oz) (05/12 0554) Last BM  Date: 06/08/17 General:   Alert,  Well-developed, well-nourished, pleasant and cooperative in NAD Head:  Normocephalic and atraumatic. Eyes:  Blind; Sclera clear, no icterus.   Conjunctiva pink. Ears:  Normal auditory acuity. Nose:  NGT in place; No deformity, discharge,  or lesions. Mouth:  No deformity or lesions.  Oropharynx pink & moist. Neck:  Supple; no masses or thyromegaly. Lungs:  Clear throughout to auscultation.   No wheezes, crackles, or rhonchi. No acute distress. Heart:  Regular rate and rhythm; no murmurs, clicks, rubs,  or gallops. Abdomen:  Soft, nontender and nondistended. No masses, hepatosplenomegaly or hernias noted. Normal bowel sounds, without guarding, and without rebound.     Msk:  Symmetrical without gross deformities. Normal posture. Pulses:  Normal pulses noted. Extremities:  Without clubbing or edema. Neurologic:  Alert and  oriented x4; diffusely weak; otherwise grossly normal neurologically. Skin:  Intact without significant lesions or rashes. Psych:  Alert and cooperative. Normal mood and affect.   Lab Results: Recent Labs    06/08/17 2325 06/09/17 0551 06/09/17 0832  WBC 16.3* 16.0* 15.8*  HGB 9.9* 9.7* 9.4*  HCT 31.4* 31.0* 30.3*  PLT 292 291 288   BMET Recent Labs    06/08/17 1146 06/09/17 0551  NA 137 138  K 4.6 4.4  CL 109 110  CO2 19* 20*  GLUCOSE 122* 106*  BUN 20 13  CREATININE 1.66* 1.34*  CALCIUM 9.0 8.7*   LFT No results for input(s): PROT, ALBUMIN, AST, ALT, ALKPHOS, BILITOT, BILIDIR, IBILI in the last 72 hours. PT/INR No results for input(s): LABPROT, INR in the last 72 hours.  Studies/Results: Ct  Abdomen Pelvis W Contrast  Result Date: 06/09/2017 CLINICAL DATA:  Dilated small large bowel seen on abdomen radiographs. Question bowel obstruction. EXAM: CT ABDOMEN AND PELVIS WITH CONTRAST TECHNIQUE: Multidetector CT imaging of the abdomen and pelvis was performed using the standard protocol following bolus administration of  intravenous contrast. CONTRAST:  13mL OMNIPAQUE IOHEXOL 300 MG/ML  SOLN COMPARISON:  Same day radiographs of the abdomen and pelvis, CT from 03/31/2004 FINDINGS: Lower chest: Normal size heart with coronary arteriosclerosis. No pulmonary consolidation, effusion or pneumothorax. Dependent bibasilar atelectasis. Hepatobiliary: Uncomplicated cholelithiasis with tiny calculi along the dependent aspect of the gallbladder. No space-occupying mass of the liver. No biliary dilatation is seen. Pancreas: Normal Spleen: Normal Adrenals/Urinary Tract: Mild thickening of the left adrenal gland without focal mass. No nephrolithiasis, renal mass nor hydroureteronephrosis. The urinary bladder is distended without focal mural thickening or calculus. Stomach/Bowel: The abnormal transmural thickening of the colon from distal transverse through rectum consistent with colitis likely postinflammatory or postinfectious, less likely ischemic. No bowel obstruction is noted. The stomach is decompressed in appearance. Normal small bowel rotation without significant dilatation. Normal appendix is visualized. Vascular/Lymphatic: Normal caliber aorta with mild-to-moderate atherosclerosis. Patent branch vessels are identified off the aorta. No lymphadenopathy. Reproductive: Hysterectomy without adnexal mass. Other: No free air nor free fluid. Musculoskeletal: Degenerative disc disease L3-4 and L4-5 with disc-osteophyte complex at L4-5 and L5-S1. IMPRESSION: 1. Moderate transmural thickening of the distal transverse colon through rectum compatible with postinfectious or postinflammatory colitis. No bowel obstruction nor perforation. 2. Uncomplicated cholelithiasis. 3. Degenerative disc disease of the lower lumbar spine without acute nor aggressive osseous abnormality. Electronically Signed   By: Ashley Royalty M.D.   On: 06/09/2017 02:36   Dg Chest Port 1 View  Result Date: 06/08/2017 CLINICAL DATA:  Leukocytosis, weakness and shortness of  breath. EXAM: PORTABLE CHEST 1 VIEW COMPARISON:  12/05/2009 and prior radiographs FINDINGS: The cardiomediastinal silhouette is unremarkable. There is no evidence of focal airspace disease, pulmonary edema, suspicious pulmonary nodule/mass, pleural effusion, or pneumothorax. No acute bony abnormalities are identified. IMPRESSION: No active disease. Electronically Signed   By: Margarette Canada M.D.   On: 06/08/2017 15:40   Dg Abd Portable 1v  Result Date: 06/09/2017 CLINICAL DATA:  67 year old female with NG tube placement. EXAM: PORTABLE ABDOMEN - 1 VIEW COMPARISON:  CT of the abdomen pelvis dated 06/09/2017 FINDINGS: Partially visualized enteric tube with tip in the upper abdomen to the right of the spine likely in the distal stomach. No bowel dilatation or evidence of obstruction. No free air. The osseous structures and soft tissues are grossly unremarkable. IMPRESSION: Enteric tube with tip likely in the distal stomach. Electronically Signed   By: Anner Crete M.D.   On: 06/09/2017 02:14   Dg Abd Portable 1v  Result Date: 06/08/2017 CLINICAL DATA:  Abdominal distension. EXAM: PORTABLE ABDOMEN - 1 VIEW COMPARISON:  None. FINDINGS: Mild gas distended small and large bowel. No intra-abdominal mass effect or pathologic calcifications; phleboliths project in the pelvis. Soft tissue planes and included osseous structures are nonsuspicious. Heterotopic ossification LEFT flank. IMPRESSION: Mild gas distended small and large bowel compatible with ileus. Electronically Signed   By: Elon Alas M.D.   On: 06/08/2017 23:02   Impression:  1.  Syncope, recurrent. 2.  Blood in stool, melena, by report (observed by others, patient is blind). 3.  Abnormal CT scan; thickening of colon from transverse to rectum. 4.  NGT placed for concern of possible bowel obstruction and retching; CT showed no  evidence of bowel obstruction.  Plan:  1.  Clamp NGT. 2.  PPI. 3.  Hold Pletal. 4.  Clear liquids; if tolerates  clear liquids with NGT clamped, I think it would be ok to remove the patient's NGT. 5.  Plan for endoscopy + flexible sigmoidoscopy tomorrow. 6.  [facility] will follow.   LOS: 0 days   Avilene Marrin M  06/09/2017, 11:45 AM  Cell [phone] If no answer or after 5 PM call [phone]

## 2017-06-09 NOTE — Consult Note (Signed)
[facility] Consultation Note  Referring Provider: Dr. Loleta Books [facility]) Primary Care Physician:  [name], MD  Reason for Consultation:  Anemia, syncope  HPI: Julie Brewer is a 67 y.o. female admitted for syncope and anemia and blood in stool.  She has had problems with syncope for awhile, and has had cardiology evaluation for this. 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  She had endoscopy about one year ago by Dr. Amedeo Plenty which showed small hiatal hernia and mild gastritis otherwise unrevealing.  Colonoscopy June 2014 by Dr. Amedeo Plenty showed a couple hyperplastic polyps, otherwise unrevealing.  She has no abdominal pain; was having some retching and xray with dilated loops, and ngt was placed and CT early this morning was done which showed colitis from transverse colon to rectum.  She is no longer nauseated.  Is on Pletal for peripheral vascular disease;    Past Medical History:  Diagnosis Date  . Asthma    No probnlems recently  . 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 Diabetes mellitus    Type II per Dr [name]  (patient said type I)  . Fibroid   . GERD (gastroesophageal reflux disease)   . Glaucoma   . Hyperlipidemia   . Hypertension   . Iron deficiency anemia 03/09/2016  . Peripheral vascular disease ([place])   . Pneumonia 2006  . Shortness of breath dyspnea    with exdrtion, "Walkling too fast"  . Stroke [facility])    no residual    Past Surgical History:  Procedure Laterality Date  . ABDOMINAL HYSTERECTOMY    . CERVICAL FUSION     with graft from hip  . COLONOSCOPY  July 09, 2012  . DIRECT LARYNGOSCOPY N/A 06/07/2014   Procedure: DIRECT LARYNGOSCOPY with BIOPSY and EXCISION VOLLECULAR CYST;  Surgeon: Ruby Cola, MD;  Location: [place];  Service: ENT;  Laterality: N/A;  . LOOP RECORDER INSERTION N/A 06/20/2016   Procedure: Loop Recorder  Insertion;  Surgeon: [name], MD;  Location: [place] [facility];  Service: Cardiovascular;  Laterality: N/A;  . REFRACTIVE SURGERY Bilateral    both eyes  . SPINE SURGERY     lumbar    Prior to Admission medications   Medication Sig Start Date End Date Taking? Authorizing Provider  amLODipine (NORVASC) 10 MG tablet Take 10 mg by mouth daily.  05/19/15  Yes [provider]  aspirin 325 MG EC tablet Take 325 mg by mouth 2 (two) times daily.    Yes [provider]  atorvastatin (LIPITOR) 20 MG tablet TAKE 1 TABLET BY MOUTH ONCE DAILY 05/29/17  Yes Elayne Snare, MD  cilostazol (PLETAL) 100 MG tablet TAKE 1 TABLET BY MOUTH TWICE DAILY 02/02/17  Yes Angelia Mould, MD  gabapentin (NEURONTIN) 100 MG capsule Take 1 capsule (100 mg total) by mouth 2 (two) times daily. 12/24/16  Yes Elayne Snare, MD  glucose blood (FREESTYLE LITE) test strip Use as instructed to check blood sugar 2 times a day dx code E11.9 03/05/14  Yes Elayne Snare, MD  indapamide (LOZOL) 1.25 MG tablet TAKE 1 TABLET BY MOUTH ONCE DAILY 05/23/17  Yes Elayne Snare, MD  insulin detemir (LEVEMIR) 100 UNIT/ML injection Inject 18 Units into the skin every morning.    Yes [provider]  insulin regular (NOVOLIN R,HUMULIN R) 100 units/mL injection Inject 5-10 Units into  the skin See admin instructions. 10 units in the morning and 5-7 units in the evening.   Yes [provider]  IRON PO Take 1 tablet by mouth daily.    Yes [provider]  lisinopril (PRINIVIL,ZESTRIL) 40 MG tablet Take 40 mg by mouth daily.  05/11/15  Yes [provider]  metFORMIN (GLUCOPHAGE-XR) 750 MG 24 hr tablet Take 1 tablet (750 mg total) by mouth daily. 05/09/17  Yes Elayne Snare, MD  omeprazole (PRILOSEC) 20 MG capsule TAKE ONE CAPSULE BY MOUTH ONCE DAILY (DUE FOR OFFICE VISIT THIS MONTH). 04/23/17  Yes Elayne Snare, MD  Vitamin D, Ergocalciferol, (DRISDOL) 50000 units CAPS capsule TAKE ONE CAPSULE BY MOUTH EVERY  7 DAYS 12/28/16  Yes Elayne Snare, MD  doxycycline (MONODOX) 100 MG capsule TAKE 1 CAPSULE BY MOUTH TWICE DAILY Patient not taking: Reported on 06/08/2017 04/22/17   Elayne Snare, MD    Current Facility-Administered Medications  Medication Dose Route Frequency Provider Last Rate Last Dose  . 0.9 %  sodium chloride infusion   Intravenous Continuous Rondel Jumbo, PA-C 100 mL/hr at 06/09/17 1137    . acetaminophen (TYLENOL) tablet 650 mg  650 mg Oral Q6H PRN Rondel Jumbo, PA-C       Or  . acetaminophen (TYLENOL) suppository 650 mg  650 mg Rectal Q6H PRN Rondel Jumbo, PA-C      . atorvastatin (LIPITOR) tablet 20 mg  20 mg Oral Daily Sharene Butters E, PA-C   20 mg at 06/08/17 1641  . famotidine (PEPCID) IVPB 20 mg premix  20 mg Intravenous Q12H Rondel Jumbo, PA-C 100 mL/hr at 06/09/17 1134 20 mg at 06/09/17 1134  . ferrous sulfate tablet 325 mg  325 mg Oral Daily Rondel Jumbo, PA-C   325 mg at 06/08/17 1641  . gabapentin (NEURONTIN) capsule 100 mg  100 mg Oral BID Rondel Jumbo, PA-C   100 mg at 06/08/17 2331  . HYDROcodone-acetaminophen (NORCO/VICODIN) 5-325 MG per tablet 1-2 tablet  1-2 tablet Oral Q4H PRN Rondel Jumbo, PA-C      . insulin aspart (novoLOG) injection 0-9 Units  0-9 Units Subcutaneous TID WC Rondel Jumbo, PA-C   1 Units at 06/08/17 1751  . insulin detemir (LEVEMIR) injection 10 Units  10 Units Subcutaneous BH-[id number] Rondel Jumbo, PA-C      . ondansetron [facility]) injection 4 mg  4 mg Intravenous Q6H PRN Hugelmeyer, Alexis, DO   4 mg at 06/08/17 2136    Allergies as of 06/08/2017 - Review Complete 06/08/2017  Allergen Reaction Noted  . Morphine and related Other (See Comments) 08/03/2011  . Penicillins Rash and Other (See Comments) 08/03/2011    Family History  Problem Relation Age of Onset  . Cancer Mother   . Heart disease Mother   . Diabetes Father   . Cancer Brother   . Cancer Brother   . Throat cancer Brother     Social History   Socioeconomic  History  . Marital status: Legally Separated    Spouse name: Not on file  . Number of children: Not on file  . Years of education: Not on file  . Highest education level: Not on file  Occupational History  . Not on file  Social Needs  . Financial resource strain: Not on file  . Food insecurity:    Worry: Not on file    Inability: Not on file  . Transportation needs:    Medical: Not on file  Non-medical: Not on file  Tobacco Use  . Smoking status: Current Every Day Smoker    Packs/day: 1.00    Years: 30.00    Pack years: 30.00    Types: Cigarettes  . Smokeless tobacco: Never Used  Substance and Sexual Activity  . Alcohol use: Yes    Comment: socially  . Drug use: No  . Sexual activity: Never    Birth control/protection: Post-menopausal, Surgical    Comment: Hysterectomy  Lifestyle  . Physical activity:    Days per week: Not on file    Minutes per session: Not on file  . Stress: Not on file  Relationships  . Social connections:    Talks on phone: Not on file    Gets together: Not on file    Attends religious service: Not on file    Active member of club or organization: Not on file    Attends meetings of clubs or organizations: Not on file    Relationship status: Not on file  . Intimate partner violence:    Fear of current or ex partner: Not on file    Emotionally abused: Not on file    Physically abused: Not on file    Forced sexual activity: Not on file  Other Topics Concern  . Not on file  Social History Narrative  . Not on file    Review of Systems: As per HPI, all others negative  Physical Exam: Vital signs in last 24 hours: Temp:  [98.7 F (37.1 C)-99.4 F (37.4 C)] 99.4 F (37.4 C) (05/12 0554) Pulse Rate:  [92-106] 106 (05/12 0554) Resp:  [16-25] 18 (05/12 0554) BP: (132-181)/(57-92) 171/76 (05/12 0554) SpO2:  [95 %-100 %] 95 % (05/12 0554) Weight:  [61.1 kg (134 lb 9.6 oz)-62 kg (136 lb 9.6 oz)] 61.1 kg (134 lb 9.6 oz) (05/12 0554) Last BM  Date: 06/08/17 General:   Alert,  Well-developed, well-nourished, pleasant and cooperative in NAD Head:  Normocephalic and atraumatic. Eyes:  Blind; Sclera clear, no icterus.   Conjunctiva pink. Ears:  Normal auditory acuity. Nose:  NGT in place; No deformity, discharge,  or lesions. Mouth:  No deformity or lesions.  Oropharynx pink & moist. Neck:  Supple; no masses or thyromegaly. Lungs:  Clear throughout to auscultation.   No wheezes, crackles, or rhonchi. No acute distress. Heart:  Regular rate and rhythm; no murmurs, clicks, rubs,  or gallops. Abdomen:  Soft, nontender and nondistended. No masses, hepatosplenomegaly or hernias noted. Normal bowel sounds, without guarding, and without rebound.     Msk:  Symmetrical without gross deformities. Normal posture. Pulses:  Normal pulses noted. Extremities:  Without clubbing or edema. Neurologic:  Alert and  oriented x4; diffusely weak; otherwise grossly normal neurologically. Skin:  Intact without significant lesions or rashes. Psych:  Alert and cooperative. Normal mood and affect.   Lab Results: Recent Labs    06/08/17 2325 06/09/17 0551 06/09/17 0832  WBC 16.3* 16.0* 15.8*  HGB 9.9* 9.7* 9.4*  HCT 31.4* 31.0* 30.3*  PLT 292 291 288   BMET Recent Labs    06/08/17 1146 06/09/17 0551  NA 137 138  K 4.6 4.4  CL 109 110  CO2 19* 20*  GLUCOSE 122* 106*  BUN 20 13  CREATININE 1.66* 1.34*  CALCIUM 9.0 8.7*   LFT No results for input(s): PROT, ALBUMIN, AST, ALT, ALKPHOS, BILITOT, BILIDIR, IBILI in the last 72 hours. PT/INR No results for input(s): LABPROT, INR in the last 72 hours.  Studies/Results: Ct  Abdomen Pelvis W Contrast  Result Date: 06/09/2017 CLINICAL DATA:  Dilated small large bowel seen on abdomen radiographs. Question bowel obstruction. EXAM: CT ABDOMEN AND PELVIS WITH CONTRAST TECHNIQUE: Multidetector CT imaging of the abdomen and pelvis was performed using the standard protocol following bolus administration of  intravenous contrast. CONTRAST:  139mL OMNIPAQUE IOHEXOL 300 MG/ML  SOLN COMPARISON:  Same day radiographs of the abdomen and pelvis, CT from 03/31/2004 FINDINGS: Lower chest: Normal size heart with coronary arteriosclerosis. No pulmonary consolidation, effusion or pneumothorax. Dependent bibasilar atelectasis. Hepatobiliary: Uncomplicated cholelithiasis with tiny calculi along the dependent aspect of the gallbladder. No space-occupying mass of the liver. No biliary dilatation is seen. Pancreas: Normal Spleen: Normal Adrenals/Urinary Tract: Mild thickening of the left adrenal gland without focal mass. No nephrolithiasis, renal mass nor hydroureteronephrosis. The urinary bladder is distended without focal mural thickening or calculus. Stomach/Bowel: The abnormal transmural thickening of the colon from distal transverse through rectum consistent with colitis likely postinflammatory or postinfectious, less likely ischemic. No bowel obstruction is noted. The stomach is decompressed in appearance. Normal small bowel rotation without significant dilatation. Normal appendix is visualized. Vascular/Lymphatic: Normal caliber aorta with mild-to-moderate atherosclerosis. Patent branch vessels are identified off the aorta. No lymphadenopathy. Reproductive: Hysterectomy without adnexal mass. Other: No free air nor free fluid. Musculoskeletal: Degenerative disc disease L3-4 and L4-5 with disc-osteophyte complex at L4-5 and L5-S1. IMPRESSION: 1. Moderate transmural thickening of the distal transverse colon through rectum compatible with postinfectious or postinflammatory colitis. No bowel obstruction nor perforation. 2. Uncomplicated cholelithiasis. 3. Degenerative disc disease of the lower lumbar spine without acute nor aggressive osseous abnormality. Electronically Signed   By: Ashley Royalty M.D.   On: 06/09/2017 02:36   Dg Chest Port 1 View  Result Date: 06/08/2017 CLINICAL DATA:  Leukocytosis, weakness and shortness of  breath. EXAM: PORTABLE CHEST 1 VIEW COMPARISON:  12/05/2009 and prior radiographs FINDINGS: The cardiomediastinal silhouette is unremarkable. There is no evidence of focal airspace disease, pulmonary edema, suspicious pulmonary nodule/mass, pleural effusion, or pneumothorax. No acute bony abnormalities are identified. IMPRESSION: No active disease. Electronically Signed   By: Margarette Canada M.D.   On: 06/08/2017 15:40   Dg Abd Portable 1v  Result Date: 06/09/2017 CLINICAL DATA:  67 year old female with NG tube placement. EXAM: PORTABLE ABDOMEN - 1 VIEW COMPARISON:  CT of the abdomen pelvis dated 06/09/2017 FINDINGS: Partially visualized enteric tube with tip in the upper abdomen to the right of the spine likely in the distal stomach. No bowel dilatation or evidence of obstruction. No free air. The osseous structures and soft tissues are grossly unremarkable. IMPRESSION: Enteric tube with tip likely in the distal stomach. Electronically Signed   By: Anner Crete M.D.   On: 06/09/2017 02:14   Dg Abd Portable 1v  Result Date: 06/08/2017 CLINICAL DATA:  Abdominal distension. EXAM: PORTABLE ABDOMEN - 1 VIEW COMPARISON:  None. FINDINGS: Mild gas distended small and large bowel. No intra-abdominal mass effect or pathologic calcifications; phleboliths project in the pelvis. Soft tissue planes and included osseous structures are nonsuspicious. Heterotopic ossification LEFT flank. IMPRESSION: Mild gas distended small and large bowel compatible with ileus. Electronically Signed   By: Elon Alas M.D.   On: 06/08/2017 23:02   Impression:  1.  Syncope, recurrent. 2.  Blood in stool, melena, by report (observed by others, patient is blind). 3.  Abnormal CT scan; thickening of colon from transverse to rectum. 4.  NGT placed for concern of possible bowel obstruction and retching; CT showed no  evidence of bowel obstruction.  Plan:  1.  Clamp NGT. 2.  PPI. 3.  Hold Pletal. 4.  Clear liquids; if tolerates  clear liquids with NGT clamped, I think it would be ok to remove the patient's NGT. 5.  Plan for endoscopy + flexible sigmoidoscopy tomorrow. 6.  [facility] will follow.   LOS: 0 days   Clarise Chacko M  06/09/2017, 11:45 AM  Cell [phone] If no answer or after 5 PM call [phone]

## 2017-06-09 NOTE — Progress Notes (Signed)
PROGRESS NOTE    Julie Brewer  NGE:[id number] DOB: 1950/03/26 DOA: 06/08/2017 PCP: [name], MD      Brief Narrative:  [name] is a 67 y.o. 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   Assessment & Plan:  Syncope This is a chronic problem.  No change.  Loop recorder interrogated, no arrhythmia.  Echo today normal.  Troponins negative overnight. -No further work up necessary, follow up with Dr. Abelina Bachelor  Hematochezia The patient is blind, but family noticed she had red blood in the toilet bowel after a bowel movement in the ER.  Family showed me a picture, showing a clearly red-tinged ER toilet bowl.  They deny anything that could be construed as melena, tarry, or black and sticky.  The patient does not full aspirin BID and Pletal for PVD.  She had a [employer] hamburger the other day, and is afraid the beef (which she does not eat often) or the sesame seed bun caused this.    Overnight, she had retching and abdominal distension.  A flat radiograph showed dilated bowel and was read as "ileus" and an NG tube was placed with minimal output.  A follow up CT showed no ileus or bowel obstruction, but did shows colitis.  Her NG drainage is clear, without hint of blood.  Source appears to be lower. -Remove NG tube -GI have been consulted in the ER, and are requesting to continue PPI and to perform upper endoscopy tomorrow -Obtain stool studies to rule out infectious colitis  Other medications -Continue gabapentin  History of stroke -Continue statin -Hold aspirin and Pletal  Hypertension -Restart amlodipine, lisinopril and indapamide  Chronic iron deficiency anemia  Hgb stable, one erroneous Hgb overnight -Continue iron  Diabetes -Continue Levemir -Continue SSI  CKD III Creatinine stable at baseline        DVT  prophylaxis: SCDs Code Status: FULL Family Communication: Daughters at bedside MDM and disposition Plan: The below labs and imaging reports were reviewed and summarized above.  The patient's status is clinically stable.  She was admitted for GI bleed, Gastroenterology have been consulted and are planning EGD tomorrow     Consultants:   Gastroenterology, Sadie Haber  Procedures:   Echocardiogram LV EF: 65% -   70%  ------------------------------------------------------------------- Indications:      Syncope 780.2.  ------------------------------------------------------------------- History:   Risk factors:  Hypertension. Dyslipidemia.  ------------------------------------------------------------------- Study Conclusions  - Left ventricle: The cavity size was normal. Systolic function was   vigorous. The estimated ejection fraction was in the range of 65%   to 70%. Wall motion was normal; there were no regional wall   motion abnormalities. Doppler parameters are consistent with   abnormal left ventricular relaxation (grade 1 diastolic   dysfunction). There was no evidence of elevated ventricular   filling pressure by Doppler parameters. - Aortic valve: There was no regurgitation. - Aortic root: The aortic root was normal in size. - Mitral valve: There was trivial regurgitation. - Right ventricle: The cavity size was mildly dilated. Wall   thickness was normal. Systolic pressure was within the normal   range. - Right atrium: The atrium was normal in size. - Tricuspid valve: There was mild regurgitation. - Pulmonic valve: There was no regurgitation. - Inferior vena cava: The vessel was normal in size. - Pericardium, extracardiac: A trivial pericardial effusion was   identified posterior to  the heart. Features were not consistent   with tamponade physiology  Antimicrobials:   None    Subjective: Feels fine, no more retching, abdominal distension.  This morning, daughter  reports she had a loose BM with "clots in it, like if she was having a period, and traces of blood".  There is no dizziness, no blood or clots or dark material at all in her NG drainage.  Objective: Vitals:   06/08/17 2100 06/08/17 2200 06/08/17 2236 06/09/17 0554  BP: (!) 132/92 (!) 168/78 (!) 179/74 (!) 171/76  Pulse: (!) 104 100 95 (!) 106  Resp: 18 16 18 18   Temp:   98.7 F (37.1 C) 99.4 F (37.4 C)  TempSrc:   Oral Oral  SpO2: 97% 98% 98% 95%  Weight:   62 kg (136 lb 9.6 oz) 61.1 kg (134 lb 9.6 oz)  Height:   5\' 3"  (1.6 m)     Intake/Output Summary (Last 24 hours) at 06/09/2017 1610 Last data filed at 06/09/2017 1137 Gross per 24 hour  Intake 2133.34 ml  Output 1100 ml  Net 1033.34 ml   Filed Weights   06/08/17 2236 06/09/17 0554  Weight: 62 kg (136 lb 9.6 oz) 61.1 kg (134 lb 9.6 oz)    Examination: General appearance: Thin adult female, interactive, lying in bed, NG in place, no acute distress.   HEENT: Anicteric, conjunctiva pink, lids and lashes normal. No nasal deformity, discharge, epistaxis.  Lips moist, NG in place, teeth normal, no oral lesions, OP moist.   Skin: Warm and dry.   No suspicious rashes or lesions. Cardiac: RRR, nl S1-S2, no murmurs appreciated.  Capillary refill is brisk.  JVP normal.  No LE edema.  Radial pulses 2+ and symmetric. Respiratory: Normal respiratory rate and rhythm.  CTAB without rales or wheezes. Abdomen: Abdomen soft.  Moderate left sided TTP without guarding or rebound or rigidity at all. No ascites, distension, hepatosplenomegaly.   MSK: No deformities or effusions. Neuro: Awake and alert.  EOMI, moves all extremities. Speech fluent.    Psych: Sensorium intact and responding to questions, attention normal. Affect normal.  Judgment and insight appear normal.    Data Reviewed: I have personally reviewed following labs and imaging studies:  CBC: Recent Labs  Lab 06/08/17 1146 06/08/17 1514 06/08/17 2325 06/09/17 0551  06/09/17 0832  WBC 22.3* 17.6* 16.3* 16.0* 15.8*  HGB 9.5* 7.3* 9.9* 9.7* 9.4*  HCT 30.2* 23.5* 31.4* 31.0* 30.3*  MCV 81.6 81.6 82.0 81.4 81.2  PLT 299 237 292 291 387   Basic Metabolic Panel: Recent Labs  Lab 06/08/17 1146 06/09/17 0551  NA 137 138  K 4.6 4.4  CL 109 110  CO2 19* 20*  GLUCOSE 122* 106*  BUN 20 13  CREATININE 1.66* 1.34*  CALCIUM 9.0 8.7*   GFR: Estimated Creatinine Clearance: 35.1 mL/min (A) (by C-G formula based on SCr of 1.34 mg/dL (H)). Liver Function Tests: No results for input(s): AST, ALT, ALKPHOS, BILITOT, PROT, ALBUMIN in the last 168 hours. No results for input(s): LIPASE, AMYLASE in the last 168 hours. No results for input(s): AMMONIA in the last 168 hours. Coagulation Profile: No results for input(s): INR, PROTIME in the last 168 hours. Cardiac Enzymes: No results for input(s): CKTOTAL, CKMB, CKMBINDEX, TROPONINI in the last 168 hours. BNP (last 3 results) No results for input(s): PROBNP in the last 8760 hours. HbA1C: No results for input(s): HGBA1C in the last 72 hours. CBG: Recent Labs  Lab 06/08/17 1102 06/08/17 1731  06/09/17 0742 06/09/17 1315  GLUCAP 125* 131* 103* 93   Lipid Profile: No results for input(s): CHOL, HDL, LDLCALC, TRIG, CHOLHDL, LDLDIRECT in the last 72 hours. Thyroid Function Tests: Recent Labs    06/08/17 1514  TSH 1.022   Anemia Panel: No results for input(s): VITAMINB12, FOLATE, FERRITIN, TIBC, IRON, RETICCTPCT in the last 72 hours. Urine analysis:    Component Value Date/Time   COLORURINE YELLOW 01/27/2016 1121   APPEARANCEUR CLEAR 01/27/2016 1121   LABSPEC 1.010 03/09/2016 1400   PHURINE 6.0 03/09/2016 1400   PHURINE 6.0 01/27/2016 1121   GLUCOSEU Negative 03/09/2016 1400   HGBUR Trace 03/09/2016 1400   HGBUR NEGATIVE 01/27/2016 1121   BILIRUBINUR Negative 03/09/2016 1400   KETONESUR Negative 03/09/2016 1400   KETONESUR NEGATIVE 01/27/2016 1121   PROTEINUR 30 03/09/2016 1400   PROTEINUR 30  (A) 12/29/2008 1755   UROBILINOGEN 0.2 03/09/2016 1400   NITRITE Negative 03/09/2016 1400   NITRITE NEGATIVE 01/27/2016 1121   LEUKOCYTESUR Negative 03/09/2016 1400   Sepsis Labs: @LABRCNTIP (procalcitonin:4,lacticacidven:4)  ) Recent Results (from the past 240 hour(s))  Culture, blood (Routine X 2) w Reflex to ID Panel     Status: None (Preliminary result)   Collection Time: 06/08/17  3:15 PM  Result Value Ref Range Status   Specimen Description BLOOD RIGHT ANTECUBITAL  Final   Special Requests   Final    BOTTLES DRAWN AEROBIC AND ANAEROBIC Blood Culture adequate volume   Culture   Final    NO GROWTH < 24 HOURS Performed at [facility], [place] [address], [place], [place] [address]    Report Status PENDING  Incomplete  Culture, blood (Routine X 2) w Reflex to ID Panel     Status: None (Preliminary result)   Collection Time: 06/08/17  6:48 PM  Result Value Ref Range Status   Specimen Description BLOOD LEFT ANTECUBITAL  Final   Special Requests   Final    BOTTLES DRAWN AEROBIC AND ANAEROBIC Blood Culture adequate volume   Culture   Final    NO GROWTH < 24 HOURS Performed at [facility], [place] [address] [place], [place] [address]    Report Status PENDING  Incomplete         Radiology Studies: Ct Abdomen Pelvis W Contrast  Result Date: 06/09/2017 CLINICAL DATA:  Dilated small large bowel seen on abdomen radiographs. Question bowel obstruction. EXAM: CT ABDOMEN AND PELVIS WITH CONTRAST TECHNIQUE: Multidetector CT imaging of the abdomen and pelvis was performed using the standard protocol following bolus administration of intravenous contrast. CONTRAST:  173mL OMNIPAQUE IOHEXOL 300 MG/ML  SOLN COMPARISON:  Same day radiographs of the abdomen and pelvis, CT from 03/31/2004 FINDINGS: Lower chest: Normal size heart with coronary arteriosclerosis. No pulmonary consolidation, effusion or pneumothorax. Dependent bibasilar atelectasis. Hepatobiliary: Uncomplicated  cholelithiasis with tiny calculi along the dependent aspect of the gallbladder. No space-occupying mass of the liver. No biliary dilatation is seen. Pancreas: Normal Spleen: Normal Adrenals/Urinary Tract: Mild thickening of the left adrenal gland without focal mass. No nephrolithiasis, renal mass nor hydroureteronephrosis. The urinary bladder is distended without focal mural thickening or calculus. Stomach/Bowel: The abnormal transmural thickening of the colon from distal transverse through rectum consistent with colitis likely postinflammatory or postinfectious, less likely ischemic. No bowel obstruction is noted. The stomach is decompressed in appearance. Normal small bowel rotation without significant dilatation. Normal appendix is visualized. Vascular/Lymphatic: Normal caliber aorta with mild-to-moderate atherosclerosis. Patent branch vessels are identified off the aorta. No lymphadenopathy.  Reproductive: Hysterectomy without adnexal mass. Other: No free air nor free fluid. Musculoskeletal: Degenerative disc disease L3-4 and L4-5 with disc-osteophyte complex at L4-5 and L5-S1. IMPRESSION: 1. Moderate transmural thickening of the distal transverse colon through rectum compatible with postinfectious or postinflammatory colitis. No bowel obstruction nor perforation. 2. Uncomplicated cholelithiasis. 3. Degenerative disc disease of the lower lumbar spine without acute nor aggressive osseous abnormality. Electronically Signed   By: Ashley Royalty M.D.   On: 06/09/2017 02:36   Dg Chest Port 1 View  Result Date: 06/08/2017 CLINICAL DATA:  Leukocytosis, weakness and shortness of breath. EXAM: PORTABLE CHEST 1 VIEW COMPARISON:  12/05/2009 and prior radiographs FINDINGS: The cardiomediastinal silhouette is unremarkable. There is no evidence of focal airspace disease, pulmonary edema, suspicious pulmonary nodule/mass, pleural effusion, or pneumothorax. No acute bony abnormalities are identified. IMPRESSION: No active  disease. Electronically Signed   By: Margarette Canada M.D.   On: 06/08/2017 15:40   Dg Abd Portable 1v  Result Date: 06/09/2017 CLINICAL DATA:  67 year old female with NG tube placement. EXAM: PORTABLE ABDOMEN - 1 VIEW COMPARISON:  CT of the abdomen pelvis dated 06/09/2017 FINDINGS: Partially visualized enteric tube with tip in the upper abdomen to the right of the spine likely in the distal stomach. No bowel dilatation or evidence of obstruction. No free air. The osseous structures and soft tissues are grossly unremarkable. IMPRESSION: Enteric tube with tip likely in the distal stomach. Electronically Signed   By: Anner Crete M.D.   On: 06/09/2017 02:14   Dg Abd Portable 1v  Result Date: 06/08/2017 CLINICAL DATA:  Abdominal distension. EXAM: PORTABLE ABDOMEN - 1 VIEW COMPARISON:  None. FINDINGS: Mild gas distended small and large bowel. No intra-abdominal mass effect or pathologic calcifications; phleboliths project in the pelvis. Soft tissue planes and included osseous structures are nonsuspicious. Heterotopic ossification LEFT flank. IMPRESSION: Mild gas distended small and large bowel compatible with ileus. Electronically Signed   By: Elon Alas M.D.   On: 06/08/2017 23:02        Scheduled Meds: . atorvastatin  20 mg Oral Daily  . bisacodyl  10 mg Rectal Once  . ferrous sulfate  325 mg Oral Daily  . gabapentin  100 mg Oral BID  . insulin aspart  0-9 Units Subcutaneous TID WC  . insulin detemir  10 Units Subcutaneous [id number]   Continuous Infusions: . sodium chloride 100 mL/hr at 06/09/17 1137  . famotidine (PEPCID) IV Stopped (06/09/17 1205)     LOS: 0 days    Time spent: 25 minutes    Edwin Dada, MD [facility] 06/09/2017, 12:30 PM     Pager [phone] --- please page though AMION:  [email or web address] Password [email or web address] If 7PM-7AM, please contact night-coverage

## 2017-06-10 ENCOUNTER — Inpatient Hospital Stay (HOSPITAL_COMMUNITY): Payer: Medicare HMO | Admitting: Certified Registered Nurse Anesthetist

## 2017-06-10 ENCOUNTER — Encounter (HOSPITAL_COMMUNITY): Payer: Self-pay | Admitting: *Deleted

## 2017-06-10 ENCOUNTER — Other Ambulatory Visit: Payer: Self-pay

## 2017-06-10 ENCOUNTER — Encounter (HOSPITAL_COMMUNITY)
Admission: EM | Disposition: A | Discharge status: Home / Self Care | Payer: Self-pay | Source: Home / Self Care | Attending: Family Medicine

## 2017-06-10 DIAGNOSIS — R195 Other fecal abnormalities: Secondary | ICD-10-CM

## 2017-06-10 DIAGNOSIS — H547 Unspecified visual loss: Secondary | ICD-10-CM

## 2017-06-10 HISTORY — PX: FLEXIBLE SIGMOIDOSCOPY: SHX5431

## 2017-06-10 HISTORY — PX: BIOPSY: SHX5522

## 2017-06-10 HISTORY — PX: ESOPHAGOGASTRODUODENOSCOPY (EGD) WITH PROPOFOL: SHX5813

## 2017-06-10 LAB — CBC
HCT: 28.1 % — ABNORMAL LOW (ref 36.0–46.0)
Hemoglobin: 8.8 g/dL — ABNORMAL LOW (ref 12.0–15.0)
MCH: 25.5 pg — AB (ref 26.0–34.0)
MCHC: 31.3 g/dL (ref 30.0–36.0)
MCV: 81.4 fL (ref 78.0–100.0)
PLATELETS: 238 10*3/uL (ref 150–400)
RBC: 3.45 MIL/uL — AB (ref 3.87–5.11)
RDW: 16.2 % — ABNORMAL HIGH (ref 11.5–15.5)
WBC: 14.6 10*3/uL — ABNORMAL HIGH (ref 4.0–10.5)

## 2017-06-10 LAB — BASIC METABOLIC PANEL
Anion gap: 5 (ref 5–15)
BUN: 11 mg/dL (ref 6–20)
CALCIUM: 8.6 mg/dL — AB (ref 8.9–10.3)
CHLORIDE: 112 mmol/L — AB (ref 101–111)
CO2: 20 mmol/L — ABNORMAL LOW (ref 22–32)
CREATININE: 1.32 mg/dL — AB (ref 0.44–1.00)
GFR calc Af Amer: 48 mL/min — ABNORMAL LOW (ref >60)
GFR calc non Af Amer: 42 mL/min — ABNORMAL LOW (ref >60)
GLUCOSE: 90 mg/dL (ref 65–99)
Potassium: 4.2 mmol/L (ref 3.5–5.1)
Sodium: 137 mmol/L (ref 135–145)

## 2017-06-10 LAB — GLUCOSE, CAPILLARY
Glucose-Capillary: 103 mg/dL — ABNORMAL HIGH (ref 65–99)
Glucose-Capillary: 93 mg/dL (ref 65–99)

## 2017-06-10 SURGERY — ESOPHAGOGASTRODUODENOSCOPY (EGD) WITH PROPOFOL
Anesthesia: Monitor Anesthesia Care | Laterality: Left

## 2017-06-10 MED ORDER — METRONIDAZOLE 500 MG PO TABS
500.0000 mg | ORAL_TABLET | Freq: Three times a day (TID) | ORAL | Status: DC
  Administered 2017-06-10: 500 mg via ORAL
  Filled 2017-06-10: qty 1

## 2017-06-10 MED ORDER — CIPROFLOXACIN HCL 500 MG PO TABS: 500.0000 mg | ORAL_TABLET | Freq: Two times a day (BID) | ORAL | 0 refills | Status: DC

## 2017-06-10 MED ORDER — ONDANSETRON HCL 4 MG PO TABS: 4.0000 mg | ORAL_TABLET | Freq: Every day | ORAL | 1 refills | Status: DC | PRN

## 2017-06-10 MED ORDER — SODIUM CHLORIDE 0.9 % IV SOLN
INTRAVENOUS | Status: DC | PRN
  Administered 2017-06-10: 10:00:00 via INTRAVENOUS

## 2017-06-10 MED ORDER — METRONIDAZOLE 500 MG PO TABS: 500.0000 mg | ORAL_TABLET | Freq: Three times a day (TID) | ORAL | 0 refills | Status: DC

## 2017-06-10 MED ORDER — SODIUM CHLORIDE 0.9 % IV BOLUS
500.0000 mL | Freq: Once | INTRAVENOUS | Status: AC
  Administered 2017-06-10: 500 mL via INTRAVENOUS

## 2017-06-10 MED ORDER — PROPOFOL 10 MG/ML IV BOLUS
INTRAVENOUS | Status: DC | PRN
  Administered 2017-06-10 (×2): 20 mg via INTRAVENOUS
  Administered 2017-06-10: 10 mg via INTRAVENOUS
  Administered 2017-06-10: 20 mg via INTRAVENOUS

## 2017-06-10 MED ORDER — PROPOFOL 500 MG/50ML IV EMUL
INTRAVENOUS | Status: DC | PRN
  Administered 2017-06-10: 75 ug/kg/min via INTRAVENOUS

## 2017-06-10 MED ORDER — CIPROFLOXACIN HCL 500 MG PO TABS
500.0000 mg | ORAL_TABLET | Freq: Two times a day (BID) | ORAL | Status: DC
  Administered 2017-06-10: 500 mg via ORAL
  Filled 2017-06-10: qty 1

## 2017-06-10 SURGICAL SUPPLY — 15 items

## 2017-06-10 NOTE — Anesthesia Postprocedure Evaluation (Signed)
Anesthesia Post Note  Patient: Julie Brewer  Procedure(s) Performed: ESOPHAGOGASTRODUODENOSCOPY (EGD) WITH PROPOFOL (Left ) FLEXIBLE SIGMOIDOSCOPY (Left ) BIOPSY     Patient location during evaluation: PACU Anesthesia Type: MAC Level of consciousness: awake and alert Pain management: pain level controlled Vital Signs Assessment: post-procedure vital signs reviewed and stable Respiratory status: spontaneous breathing, nonlabored ventilation, respiratory function stable and patient connected to nasal cannula oxygen Cardiovascular status: stable and blood pressure returned to baseline Postop Assessment: no apparent nausea or vomiting Anesthetic complications: no    Last Vitals:  Vitals:   06/10/17 1050 06/10/17 1200  BP: (!) 149/59 (!) 161/83  Pulse: 90 72  Resp: 17   Temp:  36.8 C  SpO2: 100% 100%    Last Pain:  Vitals:   06/10/17 1200  TempSrc: Oral  PainSc: 0-No pain                 Ryan P Ellender

## 2017-06-10 NOTE — Op Note (Signed)
[facility] Patient Name: Julie Brewer Procedure Date : 06/10/2017 MRN: [id number] Attending MD: Ronnette Juniper , MD Date of Birth: 1950-07-24 CSN: [id number] Age: 67 Admit Type: Inpatient Procedure:                Flexible Sigmoidoscopy Indications:              Melena Providers:                Ronnette Juniper, MD, Baird Cancer, RN, Charolette Child,                            Technician, Wyatt Haste Referring MD:              Medicines:                Monitored Anesthesia Care Complications:            No immediate complications. Estimated Blood Loss:     Estimated blood loss: none. Procedure:                Pre-Anesthesia Assessment:                           - Prior to the procedure, a History and Physical                            was performed, and patient medications and                            allergies were reviewed. The patient's tolerance of                            previous anesthesia was also reviewed. The risks                            and benefits of the procedure and the sedation                            options and risks were discussed with the patient.                            All questions were answered, and informed consent                            was obtained. Prior Anticoagulants: The patient has                            taken aspirin, last dose was 3 days prior to                            procedure. ASA Grade Assessment: III - A patient                            with severe systemic disease. After reviewing the  risks and benefits, the patient was deemed in                            satisfactory condition to undergo the procedure.                           After obtaining informed consent, the scope was                            passed under direct vision. The EC-[id number] ([id number])                            scope was introduced through the anus and advanced                            to the the left  transverse colon. The flexible                            sigmoidoscopy was accomplished without difficulty.                            The patient tolerated the procedure well. The                            quality of the bowel preparation was good. Scope In: Scope Out: Findings:      The perianal and digital rectal examinations were normal.      A patchy area of severely erythematous, hemorrhagic, inflamed, ulcerated       and vascular-pattern-decreased mucosa was found in the recto-sigmoid       colon, in the sigmoid colon, in the descending colon, at the splenic       flexure, in the mid transverse colon and in the distal transverse colon.       Biopsies were taken with a cold forceps for histology. Impression:               - Erythematous, hemorrhagic, inflamed, ulcerated                            and vascular-pattern-decreased mucosa in the                            recto-sigmoid colon, in the sigmoid colon, in the                            descending colon, at the splenic flexure, in the                            mid transverse colon and in the distal transverse                            colon. Biopsied.                           - ?Ischemic colitis. Recommendation:           -  Resume regular diet.                           - Continue present medications.                           - Await pathology results.                           - Stool studies, IV fluids, IV antibiotics. Procedure Code(s):        --- Professional ---                           [phone], Sigmoidoscopy, flexible; with biopsy, single                            or multiple Diagnosis Code(s):        --- Professional ---                           K92.2, Gastrointestinal hemorrhage, unspecified                           K52.9, Noninfective gastroenteritis and colitis,                            unspecified                           K63.3, Ulcer of intestine                           K63.89, Other specified  diseases of intestine                           K92.1, Melena (includes Hematochezia) CPT copyright 2017 American [employer] Association. All rights reserved. The codes documented in this report are preliminary and upon coder review may  be revised to meet current compliance requirements. Ronnette Juniper, MD 06/10/2017 10:34:48 AM This report has been signed electronically. Number of Addenda: 0

## 2017-06-10 NOTE — Op Note (Signed)
[facility] Patient Name: Julie Brewer Procedure Date : 06/10/2017 MRN: [id number] Attending MD: Ronnette Juniper , MD Date of Birth: November 26, 1950 CSN: [id number] Age: 67 Admit Type: Inpatient Procedure:                Upper GI endoscopy Indications:              Unexplained iron deficiency anemia, Melena Providers:                Ronnette Juniper, MD, Baird Cancer, RN, Charolette Child,                            Technician, Wyatt Haste Referring MD:              Medicines:                Monitored Anesthesia Care Complications:            No immediate complications. Estimated Blood Loss:     Estimated blood loss: none. Procedure:                Pre-Anesthesia Assessment:                           - Prior to the procedure, a History and Physical                            was performed, and patient medications and                            allergies were reviewed. The patient's tolerance of                            previous anesthesia was also reviewed. The risks                            and benefits of the procedure and the sedation                            options and risks were discussed with the patient.                            All questions were answered, and informed consent                            was obtained. Prior Anticoagulants: The patient has                            taken aspirin, last dose was 3 days prior to                            procedure. ASA Grade Assessment: III - A patient                            with severe systemic disease. After reviewing the  risks and benefits, the patient was deemed in                            satisfactory condition to undergo the procedure.                           After obtaining informed consent, the endoscope was                            passed under direct vision. Throughout the                            procedure, the patient's blood pressure, pulse, and           oxygen saturations were monitored continuously. The                            Endoscope was introduced through the mouth, and                            advanced to the second part of duodenum. The upper                            GI endoscopy was accomplished without difficulty.                            The patient tolerated the procedure well. Scope In: Scope Out: Findings:      The examined esophagus was normal.      The Z-line was regular and was found 38 cm from the incisors.      Multiple dispersed, diminutive non-bleeding erosions were found in the       cardia, in the gastric fundus and in the gastric body. There were no       stigmata of recent bleeding.      Normal mucosa was found in the gastric antrum. Biopsies were taken with       a cold forceps for Helicobacter pylori testing.      The examined duodenum was normal. Impression:               - Normal esophagus.                           - Z-line regular, 38 cm from the incisors.                           - Non-bleeding erosive gastropathy.                           - Normal mucosa was found in the antrum. Biopsied.                           - Normal examined duodenum. Moderate Sedation:      Patient did not receive moderate sedation for this procedure, but       instead received monitored anesthesia care. Recommendation:           - Resume regular diet.                           -  Continue present medications.                           - Await pathology results.                           - Use Protonix (pantoprazole) 40 mg PO daily for 2                            weeks. Procedure Code(s):        --- Professional ---                           [phone], Esophagogastroduodenoscopy, flexible,                            transoral; with biopsy, single or multiple Diagnosis Code(s):        --- Professional ---                           K31.89, Other diseases of stomach and duodenum                           D50.9,  Iron deficiency anemia, unspecified                           K92.1, Melena (includes Hematochezia) CPT copyright 2017 American Medical Association. All rights reserved. The codes documented in this report are preliminary and upon coder review may  be revised to meet current compliance requirements. Ronnette Juniper, MD 06/10/2017 10:32:28 AM This report has been signed electronically. Number of Addenda: 0

## 2017-06-10 NOTE — Progress Notes (Signed)
No bowel movent after suppository. Arthor Captain LPN

## 2017-06-10 NOTE — Interval H&P Note (Signed)
History and Physical Interval Note: 66/female with anemia, black stools,melena,abnormal CT for EGD and colonoscopy.  06/10/2017 9:49 AM  Julie Brewer  has presented today for EGD and colonoscopy, with the diagnosis of abnormal CT colon; anemia, melena  The various methods of treatment have been discussed with the patient and family. After consideration of risks, benefits and other options for treatment, the patient has consented to  Procedure(s): ESOPHAGOGASTRODUODENOSCOPY (EGD) WITH PROPOFOL (Left) FLEXIBLE SIGMOIDOSCOPY (Left) as a surgical intervention .  The patient's history has been reviewed, patient examined, no change in status, stable for surgery.  I have reviewed the patient's chart and labs.  Questions were answered to the patient's satisfaction.     Ronnette Juniper

## 2017-06-10 NOTE — Brief Op Note (Signed)
06/08/2017 - 06/10/2017  10:28 AM  PATIENT:  [name]  67 y.o. female  PRE-OPERATIVE DIAGNOSIS:  abnormal CT colon; anemia, melena  POST-OPERATIVE DIAGNOSIS:  EGD- Antrum biopsy rule out H.Pylori Flex sig- Left colon biopsy rule out ischemic colitis  PROCEDURE:  Procedure(s): ESOPHAGOGASTRODUODENOSCOPY (EGD) WITH PROPOFOL (Left) FLEXIBLE SIGMOIDOSCOPY (Left)  SURGEON:  Surgeon(s) and Role:    Ronnette Juniper, MD - Primary  PHYSICIAN ASSISTANT:   ASSISTANTS: Norlene Campbell, RN, Charolette Child, Tech  ANESTHESIA:   MAC  EBL:  Minimal   BLOOD ADMINISTERED:none  DRAINS: none   LOCAL MEDICATIONS USED:  NONE  SPECIMEN:  Biopsy / Limited Resection  DISPOSITION OF SPECIMEN:  PATHOLOGY  COUNTS:  YES  TOURNIQUET:  * No tourniquets in log *  DICTATION: .Dragon Dictation  PLAN OF CARE: Admit to inpatient   PATIENT DISPOSITION:  PACU - hemodynamically stable.   Delay start of Pharmacological VTE agent (>24hrs) due to surgical blood loss or risk of bleeding: no

## 2017-06-10 NOTE — Discharge Summary (Signed)
Physician Discharge Summary  Julie Brewer SJG:[id number] DOB: 01/11/1951 DOA: 06/08/2017  PCP: [name], MD  Admit date: 06/08/2017 Discharge date: 06/10/2017  Admitted From: Home  Disposition:  Home   Recommendations for Outpatient Follow-up:  1. Follow up with PCP in 1 week 2. Follow up with [facility] in 1-2 weeks 3. Please obtain CBC in one week 4. Please follow up on the following pending results: surgical pathology from colonoscopy  Home Health: None  Equipment/Devices: None  Discharge Condition: Good CODE STATUS: FULL Diet recommendation: Diabetic, cardiac  Brief/Interim Summary: Julie Brewer is a 67 y.o. 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    Syncope This is a chronic problem.  No change.  Loop recorder interrogated, no arrhythmia.  Echo normal.  Troponins negative.  No further work up necessary, follow up with Dr. Abelina Bachelor.  Hematochezia The patient is blind, but family noticed she had red blood in the toilet bowel after a bowel movement in the ER.  Family showed me a picture, showing a clearly red-tinged ER toilet bowl.  They deny anything that could be construed as melena, tarry, or black and sticky.  The patient does not full aspirin BID and Pletal for PVD.  She had a [employer] hamburger the other day, and is afraid the beef (which she does not eat often) or the sesame seed bun caused this.    Overnight the night of admission, she had retching and abdominal distension.  A flat radiograph showed dilated bowel and was read as "ileus" and an NG tube was placed with minimal output.  A follow up CT showed no ileus or bowel obstruction, but did shows colitis.  Her NG drainage is clear, without hint of blood.  Source appears to be lower.  NG tube removed, no further problems.    GI were consulted, recommended Flex sig,  which showed patchy redness, appeared to be ischemic, possibly infectious.  Started on empiric Cipro and Flagyl as stool could not be obtained for culture.   Will have follow up with Gastroenterology.   History of stroke Hypertension No change to statin, aspirin, Pletal, amlodipine, lisinopril, Indapamide.     Chronic iron deficiency anemia  Hgb stable despite hematochezia.  Diabetes No change  CKD III Creatinine stable at baseline     Discharge Diagnoses:  Active Problems:   Peripheral vascular disease (HCC)   Dyslipidemia   Essential hypertension   Insulin dependent diabetes mellitus with complications (HCC)   Syncope and collapse   Iron deficiency anemia   CRI (chronic renal insufficiency), stage 3 (moderate) (HCC)   History of TIA (transient ischemic attack)   Blind   GERD (gastroesophageal reflux disease)   Guaiac positive stools    Discharge Instructions  Discharge Instructions    Diet - low sodium heart healthy   Complete by:  As directed    Discharge instructions   Complete by:  As directed    From Dr. Loleta Books: You were admitted for passing out. When you passed out, we checked your loop monitor, and there was no arrhythmia (abnormal heart rhythm).  This is exactly what the loop monitor is for, to see if you have an abnormal heart rhythm (which would be a very serious thing) when you pass out, and we've now proved that you don't.  Call Dr. Donley Redder office to let him know this happened.  However,  while you were here, we also noticed that you were having blood in your bowel movements.  Dr. Therisa Doyne did an endoscopy that showed nothing concerning in your stomach, but in your colon (where bowel movements come out) there was an ulcer.  This may be from infection, and so we have started you on antibiotics: Take Cipro 500 mg twice daily and Flagyl 500 mg three times daily for 7 days then stop.  The flagyl makes some people nauseated, so take it with food.  If you  still feel nausea with it, take ondansetron 4 mg before your dose.  It may also be from poor blood flow to the colon, and so Dr. Therisa Doyne took a biopsy and the pathology doctors will look at it under the microscope.  Dr. Therisa Doyne will want to see you in the office in a week or two, they will call you for the apppoinmtent.  Please call [facility] if you haven't heard from them by the end of hte week.   Increase activity slowly   Complete by:  As directed      Allergies as of 06/10/2017      Reactions   Morphine And Related Other (See Comments)   Hallucenations    Penicillins Rash, Other (See Comments)   Swelling      Medication List    STOP taking these medications   doxycycline 100 MG capsule Commonly known as:  MONODOX     TAKE these medications   amLODipine 10 MG tablet Commonly known as:  NORVASC Take 10 mg by mouth daily.   aspirin 325 MG EC tablet Take 325 mg by mouth 2 (two) times daily.   atorvastatin 20 MG tablet Commonly known as:  LIPITOR TAKE 1 TABLET BY MOUTH ONCE DAILY   cilostazol 100 MG tablet Commonly known as:  PLETAL TAKE 1 TABLET BY MOUTH TWICE DAILY   ciprofloxacin 500 MG tablet Commonly known as:  CIPRO Take 1 tablet (500 mg total) by mouth 2 (two) times daily.   gabapentin 100 MG capsule Commonly known as:  NEURONTIN Take 1 capsule (100 mg total) by mouth 2 (two) times daily.   glucose blood test strip Commonly known as:  FREESTYLE LITE Use as instructed to check blood sugar 2 times a day dx code E11.9   indapamide 1.25 MG tablet Commonly known as:  LOZOL TAKE 1 TABLET BY MOUTH ONCE DAILY   insulin regular 100 units/mL injection Commonly known as:  NOVOLIN R,HUMULIN R Inject 5-10 Units into the skin See admin instructions. 10 units in the morning and 5-7 units in the evening.   IRON PO Take 1 tablet by mouth daily.   LEVEMIR 100 UNIT/ML injection Generic drug:  insulin detemir Inject 18 Units into the skin every morning.   lisinopril 40 MG  tablet Commonly known as:  PRINIVIL,ZESTRIL Take 40 mg by mouth daily.   metFORMIN 750 MG 24 hr tablet Commonly known as:  GLUCOPHAGE-XR Take 1 tablet (750 mg total) by mouth daily.   metroNIDAZOLE 500 MG tablet Commonly known as:  FLAGYL Take 1 tablet (500 mg total) by mouth every 8 (eight) hours.   omeprazole 20 MG capsule Commonly known as:  PRILOSEC TAKE ONE CAPSULE BY MOUTH ONCE DAILY (DUE FOR OFFICE VISIT THIS MONTH).   ondansetron 4 MG tablet Commonly known as:  ZOFRAN Take 1 tablet (4 mg total) by mouth daily as needed for nausea or vomiting.   Vitamin D (Ergocalciferol) 50000 units Caps capsule Commonly known as:  DRISDOL TAKE ONE CAPSULE BY MOUTH EVERY 7 DAYS      Follow-up Information    [name], MD. Go on 06/17/2017.   Specialty:  Family Medicine Why:  @10 :30am Contact information: [address]  [place] [address] [phone]          Allergies  Allergen Reactions  . Morphine And Related Other (See Comments)    Hallucenations   . Penicillins Rash and Other (See Comments)    Swelling    Consultations:  Gastroenterology   Procedures/Studies: Ct Abdomen Pelvis W Contrast  Result Date: 06/09/2017 CLINICAL DATA:  Dilated small large bowel seen on abdomen radiographs. Question bowel obstruction. EXAM: CT ABDOMEN AND PELVIS WITH CONTRAST TECHNIQUE: Multidetector CT imaging of the abdomen and pelvis was performed using the standard protocol following bolus administration of intravenous contrast. CONTRAST:  117mL OMNIPAQUE IOHEXOL 300 MG/ML  SOLN COMPARISON:  Same day radiographs of the abdomen and pelvis, CT from 03/31/2004 FINDINGS: Lower chest: Normal size heart with coronary arteriosclerosis. No pulmonary consolidation, effusion or pneumothorax. Dependent bibasilar atelectasis. Hepatobiliary: Uncomplicated cholelithiasis with tiny calculi along the dependent aspect of the gallbladder. No space-occupying mass of the liver. No  biliary dilatation is seen. Pancreas: Normal Spleen: Normal Adrenals/Urinary Tract: Mild thickening of the left adrenal gland without focal mass. No nephrolithiasis, renal mass nor hydroureteronephrosis. The urinary bladder is distended without focal mural thickening or calculus. Stomach/Bowel: The abnormal transmural thickening of the colon from distal transverse through rectum consistent with colitis likely postinflammatory or postinfectious, less likely ischemic. No bowel obstruction is noted. The stomach is decompressed in appearance. Normal small bowel rotation without significant dilatation. Normal appendix is visualized. Vascular/Lymphatic: Normal caliber aorta with mild-to-moderate atherosclerosis. Patent branch vessels are identified off the aorta. No lymphadenopathy. Reproductive: Hysterectomy without adnexal mass. Other: No free air nor free fluid. Musculoskeletal: Degenerative disc disease L3-4 and L4-5 with disc-osteophyte complex at L4-5 and L5-S1. IMPRESSION: 1. Moderate transmural thickening of the distal transverse colon through rectum compatible with postinfectious or postinflammatory colitis. No bowel obstruction nor perforation. 2. Uncomplicated cholelithiasis. 3. Degenerative disc disease of the lower lumbar spine without acute nor aggressive osseous abnormality. Electronically Signed   By: Ashley Royalty M.D.   On: 06/09/2017 02:36   Dg Chest Port 1 View  Result Date: 06/08/2017 CLINICAL DATA:  Leukocytosis, weakness and shortness of breath. EXAM: PORTABLE CHEST 1 VIEW COMPARISON:  12/05/2009 and prior radiographs FINDINGS: The cardiomediastinal silhouette is unremarkable. There is no evidence of focal airspace disease, pulmonary edema, suspicious pulmonary nodule/mass, pleural effusion, or pneumothorax. No acute bony abnormalities are identified. IMPRESSION: No active disease. Electronically Signed   By: Margarette Canada M.D.   On: 06/08/2017 15:40   Dg Abd Portable 1v  Result Date:  06/09/2017 CLINICAL DATA:  67 year old female with NG tube placement. EXAM: PORTABLE ABDOMEN - 1 VIEW COMPARISON:  CT of the abdomen pelvis dated 06/09/2017 FINDINGS: Partially visualized enteric tube with tip in the upper abdomen to the right of the spine likely in the distal stomach. No bowel dilatation or evidence of obstruction. No free air. The osseous structures and soft tissues are grossly unremarkable. IMPRESSION: Enteric tube with tip likely in the distal stomach. Electronically Signed   By: Anner Crete M.D.   On: 06/09/2017 02:14   Dg Abd Portable 1v  Result Date: 06/08/2017 CLINICAL DATA:  Abdominal distension. EXAM: PORTABLE ABDOMEN - 1 VIEW COMPARISON:  None. FINDINGS: Mild gas distended small and large bowel. No intra-abdominal mass effect or  pathologic calcifications; phleboliths project in the pelvis. Soft tissue planes and included osseous structures are nonsuspicious. Heterotopic ossification LEFT flank. IMPRESSION: Mild gas distended small and large bowel compatible with ileus. Electronically Signed   By: Elon Alas M.D.   On: 06/08/2017 23:02   EGD and flexible sigmoidoscopy EGD and flexible sigmoidoscopy was performed for anemia, FOBT positive stool,abnormal CAT scan.   EGD findings: Normal esophagus, irregular Z line at 38 cm. Multiple dispersed diminutive nonbleeding erosions in cardia, fundus and gastric body. Unremarkable antrum, biopsies taken for H. Pylori. Normal duodenal bulb and duodenum.  Sigmoidoscopy findings: Abnormal mucosa characterized by erythema, friability, inflammation, ulceration and edema noted from rectosigmoid extending to sigmoid, descending colon, splenic flexure, distal and mid transverse colon highly suspicious for ischemic colitis.Atherosclerosis of vessels noted on CT abdomen with contrast. Biopsies taken from random areas of left colon. Retreflexion not performed.     Subjective: Feels much beter.  Wants to go home.  No more  loose stools, no more hematochezia, no vomiting, no abdominal distension. Mild left lower quadrant abdominal pain.    Discharge Exam: Vitals:   06/10/17 1050 06/10/17 1200  BP: (!) 149/59 (!) 161/83  Pulse: 90 72  Resp: 17   Temp:  98.2 F (36.8 C)  SpO2: 100% 100%   Vitals:   06/10/17 1033 06/10/17 1040 06/10/17 1050 06/10/17 1200  BP: (!) 119/44 (!) 130/49 (!) 149/59 (!) 161/83  Pulse: 94 96 90 72  Resp: 19 20 17    Temp:    98.2 F (36.8 C)  TempSrc: Oral   Oral  SpO2: 98% 98% 100% 100%  Weight:      Height:        General: Pt is alert, awake, not in acute distress, she is blind Cardiovascular: RRR, S1/S2 +, no rubs, no gallops Respiratory: CTA bilaterally, no wheezing, no rhonchi Abdominal: Soft, mild llq TTP with guarding, no rigidity, ND, bowel sounds + Extremities: no edema, no cyanosis    The results of significant diagnostics from this hospitalization (including imaging, microbiology, ancillary and laboratory) are listed below for reference.     Microbiology: Recent Results (from the past 240 hour(s))  Culture, blood (Routine X 2) w Reflex to ID Panel     Status: None (Preliminary result)   Collection Time: 06/08/17  3:15 PM  Result Value Ref Range Status   Specimen Description BLOOD RIGHT ANTECUBITAL  Final   Special Requests   Final    BOTTLES DRAWN AEROBIC AND ANAEROBIC Blood Culture adequate volume   Culture   Final    NO GROWTH 2 DAYS Performed at [facility], [address], [place], [place] [address]    Report Status PENDING  Incomplete  Culture, blood (Routine X 2) w Reflex to ID Panel     Status: None (Preliminary result)   Collection Time: 06/08/17  6:48 PM  Result Value Ref Range Status   Specimen Description BLOOD LEFT ANTECUBITAL  Final   Special Requests   Final    BOTTLES DRAWN AEROBIC AND ANAEROBIC Blood Culture adequate volume   Culture   Final    NO GROWTH 2 DAYS Performed at [facility], [place] [address],  [place], [place] [address]    Report Status PENDING  Incomplete     Labs: BNP (last 3 results) No results for input(s): BNP in the last 8760 hours. Basic Metabolic Panel: Recent Labs  Lab 06/08/17 1146 06/09/17 0551 06/10/17 0707  NA 137 138 137  K 4.6 4.4 4.2  CL 109 110 112*  CO2 19* 20* 20*  GLUCOSE 122* 106* 90  BUN 20 13 11   CREATININE 1.66* 1.34* 1.32*  CALCIUM 9.0 8.7* 8.6*   Liver Function Tests: No results for input(s): AST, ALT, ALKPHOS, BILITOT, PROT, ALBUMIN in the last 168 hours. No results for input(s): LIPASE, AMYLASE in the last 168 hours. No results for input(s): AMMONIA in the last 168 hours. CBC: Recent Labs  Lab 06/08/17 1514 06/08/17 2325 06/09/17 0551 06/09/17 0832 06/10/17 0707  WBC 17.6* 16.3* 16.0* 15.8* 14.6*  HGB 7.3* 9.9* 9.7* 9.4* 8.8*  HCT 23.5* 31.4* 31.0* 30.3* 28.1*  MCV 81.6 82.0 81.4 81.2 81.4  PLT 237 292 291 288 238   Cardiac Enzymes: No results for input(s): CKTOTAL, CKMB, CKMBINDEX, TROPONINI in the last 168 hours. BNP: Invalid input(s): POCBNP CBG: Recent Labs  Lab 06/09/17 1315 06/09/17 1620 06/09/17 2128 06/10/17 0803 06/10/17 1141  GLUCAP 93 157* 164* 103* 93   D-Dimer No results for input(s): DDIMER in the last 72 hours. Hgb A1c No results for input(s): HGBA1C in the last 72 hours. Lipid Profile No results for input(s): CHOL, HDL, LDLCALC, TRIG, CHOLHDL, LDLDIRECT in the last 72 hours. Thyroid function studies Recent Labs    06/08/17 1514  TSH 1.022   Anemia work up No results for input(s): VITAMINB12, FOLATE, FERRITIN, TIBC, IRON, RETICCTPCT in the last 72 hours. Urinalysis    Component Value Date/Time   COLORURINE YELLOW 01/27/2016 1121   APPEARANCEUR CLEAR 01/27/2016 1121   LABSPEC 1.010 03/09/2016 1400   PHURINE 6.0 03/09/2016 1400   PHURINE 6.0 01/27/2016 1121   GLUCOSEU Negative 03/09/2016 1400   HGBUR Trace 03/09/2016 1400   HGBUR NEGATIVE 01/27/2016 1121   BILIRUBINUR Negative 03/09/2016 1400    KETONESUR Negative 03/09/2016 1400   KETONESUR NEGATIVE 01/27/2016 1121   PROTEINUR 30 03/09/2016 1400   PROTEINUR 30 (A) 12/29/2008 1755   UROBILINOGEN 0.2 03/09/2016 1400   NITRITE Negative 03/09/2016 1400   NITRITE NEGATIVE 01/27/2016 1121   LEUKOCYTESUR Negative 03/09/2016 1400   Sepsis Labs Invalid input(s): PROCALCITONIN,  WBC,  LACTICIDVEN Microbiology Recent Results (from the past 240 hour(s))  Culture, blood (Routine X 2) w Reflex to ID Panel     Status: None (Preliminary result)   Collection Time: 06/08/17  3:15 PM  Result Value Ref Range Status   Specimen Description BLOOD RIGHT ANTECUBITAL  Final   Special Requests   Final    BOTTLES DRAWN AEROBIC AND ANAEROBIC Blood Culture adequate volume   Culture   Final    NO GROWTH 2 DAYS Performed at [facility], [place] [address], [place], [place] [address]    Report Status PENDING  Incomplete  Culture, blood (Routine X 2) w Reflex to ID Panel     Status: None (Preliminary result)   Collection Time: 06/08/17  6:48 PM  Result Value Ref Range Status   Specimen Description BLOOD LEFT ANTECUBITAL  Final   Special Requests   Final    BOTTLES DRAWN AEROBIC AND ANAEROBIC Blood Culture adequate volume   Culture   Final    NO GROWTH 2 DAYS Performed at [facility], [place] [address] [place], [place] [address]    Report Status PENDING  Incomplete     Time coordinating discharge: 40 minutes       SIGNED:   Edwin Dada, MD  [facility] 06/10/2017, 3:05 PM

## 2017-06-10 NOTE — Progress Notes (Signed)
Pt has been NPO for endo this am, pt  Had only mucous blood streaked stooling yesterday, noting brown or color, report of no bm after suppository last night, unable to collect stool for gi panel

## 2017-06-10 NOTE — Progress Notes (Signed)
Pt and grandduaghter given avs, all questions entertained and answered, sheet signed by grandaughter as pt cant see, pt stable upon dc, requested wheelchair for dc

## 2017-06-10 NOTE — Op Note (Addendum)
EGD and flexible sigmoidoscopy was performed for anemia, FOBT positive stool,abnormal CAT scan.   EGD findings: Normal esophagus, irregular Z line at 38 cm. Multiple dispersed diminutive nonbleeding erosions in cardia, fundus and gastric body. Unremarkable antrum, biopsies taken for H. Pylori. Normal duodenal bulb and duodenum.  Sigmoidoscopy findings: Abnormal mucosa characterized by erythema, friability, inflammation, ulceration and edema noted from rectosigmoid extending to sigmoid, descending colon, splenic flexure, distal and mid transverse colon highly suspicious for ischemic colitis.Atherosclerosis of vessels noted on CT abdomen with contrast. Biopsies taken from random areas of left colon. Retreflexion not performed.   Recommendations: IV fluids. Studies to rule out infectious etiology. IV antibiotic. Await pathology report. Otherwise, Ok to D/C from GI standpoint.  Ronnette Juniper, M.D.

## 2017-06-10 NOTE — Anesthesia Preprocedure Evaluation (Addendum)
Anesthesia Evaluation  Patient identified by MRN, date of birth, ID band Patient awake    Reviewed: Allergy & Precautions, NPO status , Patient's Chart, lab work & pertinent test results  Airway Mallampati: II  TM Distance: >3 FB Neck ROM: Full    Dental  (+) Edentulous Lower, Edentulous Upper   Pulmonary asthma , Current Smoker,    Pulmonary exam normal breath sounds clear to auscultation       Cardiovascular hypertension, Pt. on medications Normal cardiovascular exam Rhythm:Regular Rate:Normal  ECHO: LV EF: 65% - 70%  ECG: ST, rate 106  Sees cardiologist   Neuro/Psych Blindness and low vision CVA, No Residual Symptoms negative psych ROS   GI/Hepatic Neg liver ROS, GERD  Medicated and Controlled,  Endo/Other  diabetes, Insulin Dependent, Oral Hypoglycemic Agents  Renal/GU negative Renal ROS     Musculoskeletal negative musculoskeletal ROS (+)   Abdominal   Peds  Hematology  (+) anemia , HLD   Anesthesia Other Findings abnormal CT colon; anemia, melena loop recorder  Reproductive/Obstetrics                            Anesthesia Physical Anesthesia Plan  ASA: III  Anesthesia Plan: MAC   Post-op Pain Management:    Induction: Intravenous  PONV Risk Score and Plan: 1 and Propofol infusion and Treatment may vary due to age or medical condition  Airway Management Planned: Natural Airway  Additional Equipment:   Intra-op Plan:   Post-operative Plan:   Informed Consent: I have reviewed the patients History and Physical, chart, labs and discussed the procedure including the risks, benefits and alternatives for the proposed anesthesia with the patient or authorized representative who has indicated his/her understanding and acceptance.   Dental advisory given  Plan Discussed with: CRNA  Anesthesia Plan Comments:        Anesthesia Quick Evaluation

## 2017-06-10 NOTE — Transfer of Care (Signed)
Immediate Anesthesia Transfer of Care Note  Patient: Julie Brewer  Procedure(s) Performed: ESOPHAGOGASTRODUODENOSCOPY (EGD) WITH PROPOFOL (Left ) FLEXIBLE SIGMOIDOSCOPY (Left )  Patient Location: Endoscopy Unit  Anesthesia Type:MAC  Level of Consciousness: awake, alert , oriented and patient cooperative  Airway & Oxygen Therapy: Patient Spontanous Breathing and Patient connected to nasal cannula oxygen  Post-op Assessment: Report given to RN, Post -op Vital signs reviewed and stable and Patient moving all extremities X 4  Post vital signs: Reviewed and stable  Last Vitals:  Vitals Value Taken Time  BP 119/44 06/10/2017 10:33 AM  Temp    Pulse 94 06/10/2017 10:33 AM  Resp 19 06/10/2017 10:33 AM  SpO2 98 % 06/10/2017 10:33 AM    Last Pain:  Vitals:   06/10/17 1033  TempSrc: Oral  PainSc: 0-No pain         Complications: No apparent anesthesia complications

## 2017-06-13 ENCOUNTER — Encounter (HOSPITAL_COMMUNITY): Payer: Self-pay | Admitting: Gastroenterology

## 2017-06-13 LAB — CULTURE, BLOOD (ROUTINE X 2)
CULTURE: NO GROWTH
Culture: NO GROWTH
SPECIAL REQUESTS: ADEQUATE
SPECIAL REQUESTS: ADEQUATE

## 2017-06-14 ENCOUNTER — Ambulatory Visit
Admission: RE | Admit: 2017-06-14 | Discharge: 2017-06-14 | Disposition: A | Discharge status: Home / Self Care | Payer: Medicare HMO | Source: Ambulatory Visit | Attending: Family Medicine | Admitting: Family Medicine

## 2017-06-14 DIAGNOSIS — Z1231 Encounter for screening mammogram for malignant neoplasm of breast: Secondary | ICD-10-CM

## 2017-06-17 DIAGNOSIS — Z09 Encounter for follow-up examination after completed treatment for conditions other than malignant neoplasm: Secondary | ICD-10-CM | POA: Diagnosis not present

## 2017-06-17 DIAGNOSIS — Z5181 Encounter for therapeutic drug level monitoring: Secondary | ICD-10-CM | POA: Diagnosis not present

## 2017-06-17 DIAGNOSIS — R4020 Unspecified coma: Secondary | ICD-10-CM | POA: Diagnosis not present

## 2017-06-18 LAB — CUP PACEART REMOTE DEVICE CHECK
Date Time Interrogation Session: 20190425220926
Implantable Pulse Generator Implant Date: 20180523

## 2017-06-25 ENCOUNTER — Ambulatory Visit (INDEPENDENT_AMBULATORY_CARE_PROVIDER_SITE_OTHER): Payer: Medicare HMO | Admitting: *Deleted

## 2017-06-25 DIAGNOSIS — R55 Syncope and collapse: Secondary | ICD-10-CM | POA: Diagnosis not present

## 2017-06-26 ENCOUNTER — Telehealth: Payer: Self-pay | Admitting: Oncology

## 2017-06-26 NOTE — Progress Notes (Signed)
[facility] Summary Report / Loop Recorder 

## 2017-06-26 NOTE — Telephone Encounter (Signed)
Spoke w/ pt regarding resch appt from 6/21 to 6/19.  Arrive for labs at 10:30 am, Dr following.

## 2017-07-05 ENCOUNTER — Other Ambulatory Visit (INDEPENDENT_AMBULATORY_CARE_PROVIDER_SITE_OTHER): Payer: Medicare HMO

## 2017-07-05 DIAGNOSIS — Z794 Long term (current) use of insulin: Secondary | ICD-10-CM | POA: Diagnosis not present

## 2017-07-05 DIAGNOSIS — E1165 Type 2 diabetes mellitus with hyperglycemia: Secondary | ICD-10-CM | POA: Diagnosis not present

## 2017-07-05 LAB — COMPREHENSIVE METABOLIC PANEL
ALBUMIN: 4.3 g/dL (ref 3.5–5.2)
ALK PHOS: 84 U/L (ref 39–117)
ALT: 14 U/L (ref 0–35)
AST: 17 U/L (ref 0–37)
BUN: 19 mg/dL (ref 6–23)
CO2: 21 mEq/L (ref 19–32)
Calcium: 9.8 mg/dL (ref 8.4–10.5)
Chloride: 102 mEq/L (ref 96–112)
Creatinine, Ser: 1.45 mg/dL — ABNORMAL HIGH (ref 0.40–1.20)
GFR: 46.64 mL/min — ABNORMAL LOW (ref >60.00)
Glucose, Bld: 67 mg/dL — ABNORMAL LOW (ref 70–99)
POTASSIUM: 4.3 meq/L (ref 3.5–5.1)
Sodium: 134 mEq/L — ABNORMAL LOW (ref 135–145)
TOTAL PROTEIN: 7.8 g/dL (ref 6.0–8.3)
Total Bilirubin: 0.3 mg/dL (ref 0.2–1.2)

## 2017-07-05 LAB — LIPID PANEL
CHOLESTEROL: 150 mg/dL (ref 0–200)
HDL: 52.9 mg/dL (ref >39.00)
LDL CALC: 67 mg/dL (ref 0–99)
NonHDL: 97.54
Total CHOL/HDL Ratio: 3
Triglycerides: 155 mg/dL — ABNORMAL HIGH (ref 0.0–149.0)
VLDL: 31 mg/dL (ref 0.0–40.0)

## 2017-07-05 LAB — HEMOGLOBIN A1C: HEMOGLOBIN A1C: 6.1 % (ref 4.6–6.5)

## 2017-07-10 ENCOUNTER — Encounter: Payer: Self-pay | Admitting: Endocrinology

## 2017-07-10 ENCOUNTER — Ambulatory Visit: Payer: Medicare HMO | Admitting: Endocrinology

## 2017-07-10 VITALS — BP 140/66 | HR 86 | Ht 63.0 in | Wt 141.8 lb

## 2017-07-10 DIAGNOSIS — E1165 Type 2 diabetes mellitus with hyperglycemia: Secondary | ICD-10-CM

## 2017-07-10 DIAGNOSIS — Z794 Long term (current) use of insulin: Secondary | ICD-10-CM | POA: Diagnosis not present

## 2017-07-10 NOTE — Progress Notes (Signed)
Patient ID: Julie Brewer, female   DOB: [birth year], 67 y.o.   MRN: [id number]   Reason for Appointment: Follow-up  History of Present Illness   Diagnosis: Type 2 DIABETES MELITUS, date of diagnosis:  1985  Prior history: She has been on insulin since diagnosis and on Lantus previously Also at some point had been started on Glucophage several years ago also Because of insurance preference Lantus was changed to Levemir  She refuses to use analog rapid acting insulin because of cost and is using regular insulin for several years   Her blood sugars are generally well controlled and A1c usually under 7% Her A1c previously was higher with stopping metformin at 8%  Recent history:   Insulin regimen: Levemir insulin 18 in the morning daily. Regular insulin 30 minutes Before eating, 10 units a.m. and 5 ac supper  Oral hypoglycemic drugs: Metformin ER 750 MG once daily    Her A1c is again relatively low at 6.1, previously 5.5,  Current blood sugar patterns, management and problems:  She is unable to check her blood sugar at home again and her family members do not help her with this  She is still taking relatively large amount of regular insulin to cover her breakfast  On her last visit her Levemir was reduced from 20 down to 18 because of low normal fasting readings  Lab glucose was 67 fasting  Her insulin compliance has been good and she takes her injection 30 minutes before breakfast and dinner  No hypoglycemia reported but she does not always have symptoms with this  Her niece is drawing up her insulin  Side effects from medications: None      Glucometer:  FreeStyle which is not being used    Meals:  usually 2 meals per day at 10 AM and 5 PM.    Mealtime protein sources:turkey, chicken.  Eating cereal for breakfast at times Avoiding sweet drinks  Physical activity: exercise: Just walking within the house              Microalbumin has been mildly increased  previously but recently normal   Wt Readings from Last 3 Encounters:  07/10/17 141 lb 12.8 oz (64.3 kg)  06/10/17 135 lb (61.2 kg)  04/25/17 134 lb 8 oz (61 kg)   Lab Results  Component Value Date   HGBA1C 6.1 07/05/2017   HGBA1C 5.5 03/21/2017   HGBA1C 6.1 12/19/2016   Lab Results  Component Value Date   MICROALBUR 9.0 (H) 03/21/2017   LDLCALC 67 07/05/2017   CREATININE 1.45 (H) 07/05/2017      No results found for: FRUCTOSAMINE     Lab on 07/05/2017  Component Date Value Ref Range Status  . Cholesterol 07/05/2017 150  0 - 200 mg/dL Final   ATP III Classification       Desirable:  < 200 mg/dL               Borderline High:  200 - 239 mg/dL          High:  > = 240 mg/dL  . Triglycerides 07/05/2017 155.0* 0.0 - 149.0 mg/dL Final   Normal:  <150 mg/dLBorderline High:  150 - 199 mg/dL  . HDL 07/05/2017 52.90  >39.00 mg/dL Final  . VLDL 07/05/2017 31.0  0.0 - 40.0 mg/dL Final  . LDL Cholesterol 07/05/2017 67  0 - 99 mg/dL Final  . Total CHOL/HDL Ratio 07/05/2017 3   Final  Men          Women1/2 Average Risk     3.4          3.3Average Risk          5.0          4.42X Average Risk          9.6          7.13X Average Risk          15.0          11.0                      . NonHDL 07/05/2017 97.54   Final   NOTE:  Non-HDL goal should be 30 mg/dL higher than patient's LDL goal (i.e. LDL goal of < 70 mg/dL, would have non-HDL goal of < 100 mg/dL)  . Sodium 07/05/2017 134* 135 - 145 mEq/L Final  . Potassium 07/05/2017 4.3  3.5 - 5.1 mEq/L Final  . Chloride 07/05/2017 102  96 - 112 mEq/L Final  . CO2 07/05/2017 21  19 - 32 mEq/L Final  . Glucose, Bld 07/05/2017 67* 70 - 99 mg/dL Final  . BUN 07/05/2017 19  6 - 23 mg/dL Final  . Creatinine, Ser 07/05/2017 1.45* 0.40 - 1.20 mg/dL Final  . Total Bilirubin 07/05/2017 0.3  0.2 - 1.2 mg/dL Final  . Alkaline Phosphatase 07/05/2017 84  39 - 117 U/L Final  . AST 07/05/2017 17  0 - 37 U/L Final  . ALT 07/05/2017 14  0 - 35  U/L Final  . Total Protein 07/05/2017 7.8  6.0 - 8.3 g/dL Final  . Albumin 07/05/2017 4.3  3.5 - 5.2 g/dL Final  . Calcium 07/05/2017 9.8  8.4 - 10.5 mg/dL Final  . GFR 07/05/2017 46.64* >60.00 mL/min Final  . Hgb A1c MFr Bld 07/05/2017 6.1  4.6 - 6.5 % Final   Glycemic Control Guidelines for People with Diabetes:Non Diabetic:  <6%Goal of Therapy: <7%Additional Action Suggested:  >8%     Allergies as of 07/10/2017      Reactions   Morphine And Related Other (See Comments)   Hallucenations    Penicillins Rash, Other (See Comments)   Swelling      Medication List        Accurate as of 07/10/17 10:46 AM. Always use your most recent med list.          amLODipine 10 MG tablet Commonly known as:  NORVASC Take 10 mg by mouth daily.   aspirin 325 MG EC tablet Take 325 mg by mouth 2 (two) times daily.   atorvastatin 20 MG tablet Commonly known as:  LIPITOR TAKE 1 TABLET BY MOUTH ONCE DAILY   cilostazol 100 MG tablet Commonly known as:  PLETAL TAKE 1 TABLET BY MOUTH TWICE DAILY   ciprofloxacin 500 MG tablet Commonly known as:  CIPRO Take 1 tablet (500 mg total) by mouth 2 (two) times daily.   gabapentin 100 MG capsule Commonly known as:  NEURONTIN Take 1 capsule (100 mg total) by mouth 2 (two) times daily.   glucose blood test strip Commonly known as:  FREESTYLE LITE Use as instructed to check blood sugar 2 times a day dx code E11.9   indapamide 1.25 MG tablet Commonly known as:  LOZOL TAKE 1 TABLET BY MOUTH ONCE DAILY   insulin regular 100 units/mL injection Commonly known as:  NOVOLIN R,HUMULIN R Inject 5-10 Units into the skin See admin  instructions. 10 units in the morning and 5-7 units in the evening.   IRON PO Take 1 tablet by mouth daily.   LEVEMIR 100 UNIT/ML injection Generic drug:  insulin detemir Inject 18 Units into the skin every morning.   lisinopril 40 MG tablet Commonly known as:  PRINIVIL,ZESTRIL Take 40 mg by mouth daily.   metFORMIN 750 MG  24 hr tablet Commonly known as:  GLUCOPHAGE-XR Take 1 tablet (750 mg total) by mouth daily.   omeprazole 20 MG capsule Commonly known as:  PRILOSEC TAKE ONE CAPSULE BY MOUTH ONCE DAILY (DUE FOR OFFICE VISIT THIS MONTH).   Vitamin D (Ergocalciferol) 50000 units Caps capsule Commonly known as:  DRISDOL TAKE ONE CAPSULE BY MOUTH EVERY 7 DAYS       Allergies:  Allergies  Allergen Reactions  . Morphine And Related Other (See Comments)    Hallucenations   . Penicillins Rash and Other (See Comments)    Swelling    Past Medical History:  Diagnosis Date  . Asthma    No probnlems recently  . 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 Diabetes mellitus    Type II per Dr [name]  (patient said type I)  . Fibroid   . GERD (gastroesophageal reflux disease)   . Glaucoma   . Hyperlipidemia   . Hypertension   . Iron deficiency anemia 03/09/2016  . Peripheral vascular disease ([place])   . Pneumonia 2006  . Shortness of breath dyspnea    with exdrtion, "Walkling too fast"  . Stroke [facility])    no residual    Past Surgical History:  Procedure Laterality Date  . ABDOMINAL HYSTERECTOMY    . BIOPSY  06/10/2017   Procedure: BIOPSY;  Surgeon: Ronnette Juniper, MD;  Location: [place];  Service: Gastroenterology;;  . CERVICAL FUSION     with graft from hip  . COLONOSCOPY  July 09, 2012  . DIRECT LARYNGOSCOPY N/A 06/07/2014   Procedure: DIRECT LARYNGOSCOPY with BIOPSY and EXCISION VOLLECULAR CYST;  Surgeon: Ruby Cola, MD;  Location: [facility] OR;  Service: ENT;  Laterality: N/A;  . ESOPHAGOGASTRODUODENOSCOPY (EGD) WITH PROPOFOL Left 06/10/2017   Procedure: ESOPHAGOGASTRODUODENOSCOPY (EGD) WITH PROPOFOL;  Surgeon: Ronnette Juniper, MD;  Location: [place];  Service: Gastroenterology;  Laterality: Left;  . FLEXIBLE SIGMOIDOSCOPY Left 06/10/2017   Procedure: FLEXIBLE SIGMOIDOSCOPY;  Surgeon: Ronnette Juniper, MD;  Location: [place];  Service: Gastroenterology;   Laterality: Left;  . LOOP RECORDER INSERTION N/A 06/20/2016   Procedure: Loop Recorder Insertion;  Surgeon: [name], MD;  Location: [place] [facility];  Service: Cardiovascular;  Laterality: N/A;  . REFRACTIVE SURGERY Bilateral    both eyes  . SPINE SURGERY     lumbar    Family History  Problem Relation Age of Onset  . Cancer Mother   . Heart disease Mother   . Diabetes Father   . Cancer Brother   . Cancer Brother   . Throat cancer Brother     Social History:  reports that she has been smoking cigarettes.  She has a 30.00 pack-year smoking history. She has never used smokeless tobacco. She reports that she drinks alcohol. She reports that she does not use drugs.  Review of Systems:  HYPONATREMIA:  With taking indapamide every other day now instead of daily her sodium is not as low and nearly normal now  Blindness: She has absent vision on the right side and can see silhouettes  only on the left.    HYPERTENSION:  controlled with taking amlodipine 10 mg, indapamide 1.25 and lisinopril 40 mg dose.    BP Readings from Last 3 Encounters:  07/10/17 140/66  06/10/17 (!) 161/83  04/25/17 (!) 142/44     Renal function: Her creatinine is up compared to her last visit  Lab Results  Component Value Date   CREATININE 1.45 (H) 07/05/2017   CREATININE 1.32 (H) 06/10/2017   CREATININE 1.34 (H) 06/09/2017      HYPERLIPIDEMIA: The lipid abnormality consists of elevated LDL controlled with Lipitor 20 mg .   Her baseline LDL was 202 Her last LDL is as follows:  Lab Results  Component Value Date   CHOL 150 07/05/2017   HDL 52.90 07/05/2017   LDLCALC 67 07/05/2017   TRIG 155.0 (H) 07/05/2017   CHOLHDL 3 07/05/2017    Vitamin D: Taking 50,000 unit dosage once a month From her PCP   Lab Results  Component Value Date   VD25OH 40.22 09/13/2016       Last diabetic foot exam was done in 6/19  Takes Gabapentin regularly twice daily with control of her leg  pains    Examination:   BP 140/66 (BP Location: Left Arm, Patient Position: Sitting, Cuff Size: Normal)   Pulse 86   Ht 5\' 3"  (1.6 m)   Wt 141 lb 12.8 oz (64.3 kg)   SpO2 97%   BMI 25.12 kg/m   Body mass index is 25.12 kg/m.   REPEAT blood pressure standing on the left side was 110/40  Diabetic Foot Exam - Simple   Simple Foot Form Diabetic Foot exam was performed with the following findings:  Yes 07/10/2017 10:45 AM  Visual Inspection See comments:  Yes Sensation Testing See comments:  Yes Pulse Check See comments:  Yes Comments Callus on plantar surface present on the right mid distal foot Almost completely absent monofilament sensation on the dorsum distal toes Sensation present on the plantar surfaces of the decrease Absent left pedal pulses     No pedal edema  ASSESSMENT/ PLAN:   Diabetes type 2 on insulin  See history of present illness for detailed discussion of current management, blood sugar patterns and problems identified  Her A1c may be falsely low but her blood sugars appear to be still fairly good with fasting lab glucose only 67 Difficult to adjust her insulin because of lack of home glucose monitoring For now we will reduce her Levemir down to 16   HYPONATREMIA: Her sodium is nearly normal Better with reducing indapamide to every other day and will continue   HYPERTENSION:  Blood pressure is  low standing up Considering her age and renal dysfunction blood pressure treatment needs to be adjusted This is especially with her history of hypotension requiring her admission last month  Since she has no recent microalbuminuria will reduce her lisinopril in half  Renal dysfunction: This may improve with reducing lisinopril    Patient Instructions  Levemir 16 units and 8 R in am insulin  Lisinopril 1/2 tab daily   Julie Brewer 07/10/2017, 10:46 AM     Note: This office note was prepared with Dragon voice recognition system technology. Any  transcriptional errors that result from this process are unintentional.

## 2017-07-10 NOTE — Patient Instructions (Addendum)
Levemir 16 units and 8 R in am insulin  Lisinopril 1/2 tab daily

## 2017-07-17 ENCOUNTER — Inpatient Hospital Stay: Payer: Medicare HMO | Attending: Oncology

## 2017-07-17 ENCOUNTER — Inpatient Hospital Stay (HOSPITAL_BASED_OUTPATIENT_CLINIC_OR_DEPARTMENT_OTHER): Payer: Medicare HMO | Admitting: Oncology

## 2017-07-17 ENCOUNTER — Telehealth: Payer: Self-pay | Admitting: Oncology

## 2017-07-17 VITALS — BP 159/66 | HR 96 | Temp 98.0°F | Resp 17 | Ht 63.0 in | Wt 140.4 lb

## 2017-07-17 DIAGNOSIS — N189 Chronic kidney disease, unspecified: Secondary | ICD-10-CM

## 2017-07-17 DIAGNOSIS — H409 Unspecified glaucoma: Secondary | ICD-10-CM | POA: Diagnosis not present

## 2017-07-17 DIAGNOSIS — D649 Anemia, unspecified: Secondary | ICD-10-CM | POA: Diagnosis not present

## 2017-07-17 DIAGNOSIS — K625 Hemorrhage of anus and rectum: Secondary | ICD-10-CM | POA: Diagnosis not present

## 2017-07-17 DIAGNOSIS — I129 Hypertensive chronic kidney disease with stage 1 through stage 4 chronic kidney disease, or unspecified chronic kidney disease: Secondary | ICD-10-CM | POA: Diagnosis not present

## 2017-07-17 DIAGNOSIS — E119 Type 2 diabetes mellitus without complications: Secondary | ICD-10-CM

## 2017-07-17 DIAGNOSIS — D508 Other iron deficiency anemias: Secondary | ICD-10-CM

## 2017-07-17 LAB — BASIC METABOLIC PANEL - CANCER CENTER ONLY
Anion gap: 8 (ref 3–11)
BUN: 21 mg/dL (ref 7–26)
CALCIUM: 9.4 mg/dL (ref 8.4–10.4)
CO2: 20 mmol/L — AB (ref 22–29)
CREATININE: 1.39 mg/dL — AB (ref 0.60–1.10)
Chloride: 109 mmol/L (ref 98–109)
GFR, EST AFRICAN AMERICAN: 45 mL/min — AB (ref >60)
GFR, Estimated: 39 mL/min — ABNORMAL LOW (ref >60)
Glucose, Bld: 180 mg/dL — ABNORMAL HIGH (ref 70–140)
Potassium: 4.1 mmol/L (ref 3.5–5.1)
SODIUM: 137 mmol/L (ref 136–145)

## 2017-07-17 LAB — IRON AND TIBC
Iron: 27 ug/dL — ABNORMAL LOW (ref 41–142)
SATURATION RATIOS: 10 % — AB (ref 21–57)
TIBC: 270 ug/dL (ref 236–444)
UIBC: 243 ug/dL

## 2017-07-17 LAB — CUP PACEART REMOTE DEVICE CHECK
Implantable Pulse Generator Implant Date: 20180523
MDC IDC SESS DTM: 20190528224022

## 2017-07-17 LAB — CBC WITH DIFFERENTIAL (CANCER CENTER ONLY)
BASOS PCT: 1 %
Basophils Absolute: 0.1 10*3/uL (ref 0.0–0.1)
EOS PCT: 2 %
Eosinophils Absolute: 0.2 10*3/uL (ref 0.0–0.5)
HCT: 29.8 % — ABNORMAL LOW (ref 34.8–46.6)
HEMOGLOBIN: 9.3 g/dL — AB (ref 11.6–15.9)
Lymphocytes Relative: 15 %
Lymphs Abs: 1.5 10*3/uL (ref 0.9–3.3)
MCH: 25 pg — ABNORMAL LOW (ref 25.1–34.0)
MCHC: 31.2 g/dL — AB (ref 31.5–36.0)
MCV: 80.1 fL (ref 79.5–101.0)
MONOS PCT: 6 %
Monocytes Absolute: 0.6 10*3/uL (ref 0.1–0.9)
NEUTROS PCT: 76 %
Neutro Abs: 7.4 10*3/uL — ABNORMAL HIGH (ref 1.5–6.5)
PLATELETS: 239 10*3/uL (ref 145–400)
RBC: 3.72 MIL/uL (ref 3.70–5.45)
RDW: 16.4 % — AB (ref 11.2–14.5)
WBC Count: 9.8 10*3/uL (ref 3.9–10.3)

## 2017-07-17 LAB — FERRITIN: Ferritin: 36 ng/mL (ref 9–269)

## 2017-07-17 NOTE — Progress Notes (Signed)
  [place] OFFICE PROGRESS NOTE   Diagnosis: Anemia  INTERVAL HISTORY:   She was admitted last month with syncope and anemia.  She had rectal bleeding.  Gastroenterology was consulted.  An upper endoscopy and flexible sigmoidoscopy on 06/10/2017 revealed multiple nonbleeding erosions in the stomach.  Abnormal mucosa in the colon characterized by erythema, friability, inflammation, ulceration, and edema was noted extending from the rectosigmoid to the mid transverse colon suspicious for ischemic colitis.  The biopsies from the stomach were negative for H. pylori.  Biopsies from the left colon revealed changes consistent with ischemic colitis.  No further bleeding.  She feels well at present.  Objective:  Vital signs in last 24 hours:  Blood pressure (!) 159/66, pulse 96, temperature 98 F (36.7 C), temperature source Oral, resp. rate 17, height 5\' 3"  (1.6 m), weight 140 lb 6.4 oz (63.7 kg), SpO2 100 %.    Lymphatics: No cervical or supraclavicular nodes Resp: Lungs clear bilaterally Cardio: Regular rate and rhythm GI: No hepatosplenomegaly, nontender, no mass Vascular: No leg edema  Lab Results:  Lab Results  Component Value Date   WBC 9.8 07/17/2017   HGB 9.3 (L) 07/17/2017   HCT 29.8 (L) 07/17/2017   MCV 80.1 07/17/2017   PLT 239 07/17/2017   NEUTROABS 7.4 (H) 07/17/2017    CMP  Lab Results  Component Value Date   NA 137 07/17/2017   K 4.1 07/17/2017   CL 109 07/17/2017   CO2 20 (L) 07/17/2017   GLUCOSE 180 (H) 07/17/2017   BUN 21 07/17/2017   CREATININE 1.39 (H) 07/17/2017   CALCIUM 9.4 07/17/2017   PROT 7.8 07/05/2017   ALBUMIN 4.3 07/05/2017   AST 17 07/05/2017   ALT 14 07/05/2017   ALKPHOS 84 07/05/2017   BILITOT 0.3 07/05/2017   GFRNONAA 39 (L) 07/17/2017   GFRAA 45 (L) 07/17/2017     Medications: I have reviewed the patient's current medications.   Assessment/Plan: 1. Mild anemia ? Low ferritin(6.9)01/09/2016  and(9.7)02/28/2016 ? Ferritin normal (104) 03/09/2016 ? Negative stool Hemoccult 03/09/2016 (she is unable to complete stool cards due to blindness) ? Ferritin normal (86)05/18/2016 ? 04/20/2018molecular testing confirmsalphathalassemia trait 2. Chronic red cell microcytosis 3. Chronic renal failure 4. Hypertension 5. Diabetes 6. Glaucoma with blindness 7. Peripheral neuropathy 8. 07/09/2012 colonoscopy-2 sessile polyps found in the rectum, removed (hyperplastic polyp, no adenomatous change or malignancy) 9. Admission 06/08/2017 with syncope and rectal bleeding, sigmoidoscopy 06/10/2017 confirmed ischemic colitis   Disposition: Ms. Romero appears stable.  She was admitted with ischemic colitis last month.  The hemoglobin is higher today compared to when she was discharged from the hospital.  The anemia is most likely secondary to iron deficiency, bleeding, thalassemia trait, and renal failure.  She appears asymptomatic from the anemia. She will increase the ferrous sulfate to twice daily.  She will use stool softeners and laxatives as needed if she develops constipation while on iron.  She will contact us for symptoms of anemia.  She will return for an office and lab visit in 4 months.  Betsy Coder, MD  07/17/2017  11:03 AM

## 2017-07-17 NOTE — Telephone Encounter (Signed)
Appointments scheduled AVS/Calendar printed per 6/19 los °

## 2017-07-19 ENCOUNTER — Other Ambulatory Visit: Payer: Medicare HMO

## 2017-07-19 ENCOUNTER — Ambulatory Visit: Payer: Medicare HMO | Admitting: Oncology

## 2017-07-19 ENCOUNTER — Telehealth: Payer: Self-pay

## 2017-07-19 NOTE — Telephone Encounter (Addendum)
Pt voiced understanding  ----- Message from Ladell Pier, MD sent at 07/17/2017  2:57 PM EDT ----- Please call patient, the iron stores are low, continue twice daily ferrous sulfate, follow-up as scheduled

## 2017-07-19 NOTE — Telephone Encounter (Deleted)
LVM for pt to return call to [facility]

## 2017-07-26 ENCOUNTER — Other Ambulatory Visit: Payer: Medicare HMO

## 2017-07-26 ENCOUNTER — Ambulatory Visit: Payer: Medicare HMO | Admitting: Oncology

## 2017-07-29 ENCOUNTER — Ambulatory Visit (INDEPENDENT_AMBULATORY_CARE_PROVIDER_SITE_OTHER): Payer: Medicare HMO | Admitting: *Deleted

## 2017-07-29 DIAGNOSIS — R55 Syncope and collapse: Secondary | ICD-10-CM | POA: Diagnosis not present

## 2017-07-29 NOTE — Progress Notes (Signed)
[facility] Summary Report / Loop Recorder 

## 2017-08-04 ENCOUNTER — Other Ambulatory Visit: Payer: Self-pay | Admitting: Endocrinology

## 2017-08-14 ENCOUNTER — Ambulatory Visit: Payer: Medicare HMO | Admitting: Podiatry

## 2017-08-14 ENCOUNTER — Encounter: Payer: Self-pay | Admitting: Podiatry

## 2017-08-14 DIAGNOSIS — D689 Coagulation defect, unspecified: Secondary | ICD-10-CM

## 2017-08-14 DIAGNOSIS — E1151 Type 2 diabetes mellitus with diabetic peripheral angiopathy without gangrene: Secondary | ICD-10-CM

## 2017-08-14 DIAGNOSIS — B351 Tinea unguium: Secondary | ICD-10-CM | POA: Diagnosis not present

## 2017-08-14 DIAGNOSIS — Q828 Other specified congenital malformations of skin: Secondary | ICD-10-CM

## 2017-08-14 DIAGNOSIS — M2011 Hallux valgus (acquired), right foot: Secondary | ICD-10-CM

## 2017-08-14 DIAGNOSIS — M79674 Pain in right toe(s): Secondary | ICD-10-CM

## 2017-08-14 DIAGNOSIS — M79675 Pain in left toe(s): Secondary | ICD-10-CM | POA: Diagnosis not present

## 2017-08-14 DIAGNOSIS — M2012 Hallux valgus (acquired), left foot: Secondary | ICD-10-CM

## 2017-08-14 MED ORDER — ECONAZOLE NITRATE 1 % EX CREA: TOPICAL_CREAM | Freq: Every day | CUTANEOUS | 0 refills | Status: DC

## 2017-08-14 NOTE — Progress Notes (Signed)
Complaint:  Visit Type: Patient returns to my office for continued preventative foot care services. Complaint: Patient states" my nails have grown long and thick and become painful to walk and wear shoes" Patient has been diagnosed with DM with angiopathy.. The patient presents for preventative foot care services. No changes to ROS.  Patient is blind and taking pletal. Patient is diagnosed with peripheral vascular disease.  Podiatric Exam: Vascular: dorsalis pedis and posterior tibial pulses are  palpable bilateral. Capillary return is immediate. Temperature gradient is WNL. Skin turgor WNL  Sensorium: Normal Semmes Weinstein monofilament test. Normal tactile sensation bilaterally. Nail Exam: Pt has thick disfigured discolored nails with subungual debris noted bilateral entire nail hallux through fifth toenails Ulcer Exam: There is no evidence of ulcer or pre-ulcerative changes or infection. Orthopedic Exam: Muscle tone and strength are WNL. No limitations in general ROM. No crepitus or effusions noted. Foot type and digits show no abnormalities. HAV  B/L. Skin:  Porokeratosis sub 2,5 right foot.  Sub 2 left. No infection or ulcers  Diagnosis:  Onychomycosis, , Pain in right toe, pain in left toes,  Porokeratosis sub 2 right.  Treatment & Plan Procedures and Treatment: Consent by patient was obtained for treatment procedures.   Debridement of mycotic and hypertrophic toenails, 1 through 5 bilateral and clearing of subungual debris. Debride porokeratosis  Right. No ulceration, no infection noted. Pt qualifies for diabetic shoes since she is diagnosed with DM and pvd and hav  B/L.Marland Kitchen  Patient to make appointment with Liliane Channel. Return Visit-Office Procedure: Patient instructed to return to the office for a follow up visit 3 months for continued evaluation and treatment.    Gardiner Barefoot DPM

## 2017-08-19 ENCOUNTER — Ambulatory Visit: Payer: Medicare HMO | Admitting: Orthotics

## 2017-08-19 DIAGNOSIS — Q828 Other specified congenital malformations of skin: Secondary | ICD-10-CM

## 2017-08-19 DIAGNOSIS — E1151 Type 2 diabetes mellitus with diabetic peripheral angiopathy without gangrene: Secondary | ICD-10-CM

## 2017-08-19 DIAGNOSIS — M2011 Hallux valgus (acquired), right foot: Secondary | ICD-10-CM

## 2017-08-19 NOTE — Progress Notes (Signed)
Patient came in today for fitting and eval for diabetic shoes: Patient' doctor here is Prudence Davidson  Patient presents with DM2, PN, HT, Callus  Patient was measured with brannock 8.5 device and cast in foam for custom inserts.  Patient chose apex 821

## 2017-08-27 LAB — CUP PACEART REMOTE DEVICE CHECK
Date Time Interrogation Session: 20190630233913
MDC IDC PG IMPLANT DT: 20180523

## 2017-08-30 ENCOUNTER — Ambulatory Visit (INDEPENDENT_AMBULATORY_CARE_PROVIDER_SITE_OTHER): Payer: Medicare HMO | Admitting: *Deleted

## 2017-08-30 ENCOUNTER — Other Ambulatory Visit: Payer: Self-pay | Admitting: Vascular Surgery

## 2017-08-30 DIAGNOSIS — R55 Syncope and collapse: Secondary | ICD-10-CM | POA: Diagnosis not present

## 2017-09-02 NOTE — Progress Notes (Signed)
[facility] Summary Report / Loop Recorder 

## 2017-09-05 ENCOUNTER — Other Ambulatory Visit: Payer: Self-pay

## 2017-09-06 ENCOUNTER — Telehealth: Payer: Self-pay | Admitting: *Deleted

## 2017-09-06 NOTE — Telephone Encounter (Signed)
Sched. Appt. Spoke with pt. VWB ABI + F/u NP s/p circulation 09/24/17 11am ABI 12:15 f/u NP

## 2017-09-09 ENCOUNTER — Other Ambulatory Visit: Payer: Self-pay

## 2017-09-09 ENCOUNTER — Other Ambulatory Visit: Payer: Self-pay | Admitting: Endocrinology

## 2017-09-10 DIAGNOSIS — E1142 Type 2 diabetes mellitus with diabetic polyneuropathy: Secondary | ICD-10-CM | POA: Diagnosis not present

## 2017-09-10 DIAGNOSIS — K5900 Constipation, unspecified: Secondary | ICD-10-CM | POA: Diagnosis not present

## 2017-09-10 DIAGNOSIS — E1151 Type 2 diabetes mellitus with diabetic peripheral angiopathy without gangrene: Secondary | ICD-10-CM | POA: Diagnosis not present

## 2017-09-10 DIAGNOSIS — I10 Essential (primary) hypertension: Secondary | ICD-10-CM | POA: Diagnosis not present

## 2017-09-10 DIAGNOSIS — D509 Iron deficiency anemia, unspecified: Secondary | ICD-10-CM | POA: Diagnosis not present

## 2017-09-10 DIAGNOSIS — E785 Hyperlipidemia, unspecified: Secondary | ICD-10-CM | POA: Diagnosis not present

## 2017-09-10 DIAGNOSIS — H547 Unspecified visual loss: Secondary | ICD-10-CM | POA: Diagnosis not present

## 2017-09-10 DIAGNOSIS — R69 Illness, unspecified: Secondary | ICD-10-CM | POA: Diagnosis not present

## 2017-09-10 DIAGNOSIS — K219 Gastro-esophageal reflux disease without esophagitis: Secondary | ICD-10-CM | POA: Diagnosis not present

## 2017-09-10 DIAGNOSIS — Z794 Long term (current) use of insulin: Secondary | ICD-10-CM | POA: Diagnosis not present

## 2017-09-13 ENCOUNTER — Other Ambulatory Visit (INDEPENDENT_AMBULATORY_CARE_PROVIDER_SITE_OTHER): Payer: Medicare HMO

## 2017-09-13 DIAGNOSIS — E1165 Type 2 diabetes mellitus with hyperglycemia: Secondary | ICD-10-CM

## 2017-09-13 DIAGNOSIS — Z794 Long term (current) use of insulin: Secondary | ICD-10-CM

## 2017-09-13 LAB — BASIC METABOLIC PANEL
BUN: 22 mg/dL (ref 6–23)
CO2: 24 mEq/L (ref 19–32)
Calcium: 9.9 mg/dL (ref 8.4–10.5)
Chloride: 102 mEq/L (ref 96–112)
Creatinine, Ser: 1.67 mg/dL — ABNORMAL HIGH (ref 0.40–1.20)
GFR: 39.6 mL/min — AB (ref >60.00)
GLUCOSE: 102 mg/dL — AB (ref 70–99)
POTASSIUM: 5.3 meq/L — AB (ref 3.5–5.1)
Sodium: 132 mEq/L — ABNORMAL LOW (ref 135–145)

## 2017-09-14 LAB — FRUCTOSAMINE: FRUCTOSAMINE: 229 umol/L (ref 0–285)

## 2017-09-16 ENCOUNTER — Ambulatory Visit: Payer: Medicare HMO | Admitting: Endocrinology

## 2017-09-16 DIAGNOSIS — Z0289 Encounter for other administrative examinations: Secondary | ICD-10-CM

## 2017-09-17 DIAGNOSIS — Z720 Tobacco use: Secondary | ICD-10-CM | POA: Diagnosis not present

## 2017-09-17 DIAGNOSIS — E1129 Type 2 diabetes mellitus with other diabetic kidney complication: Secondary | ICD-10-CM | POA: Diagnosis not present

## 2017-09-17 DIAGNOSIS — N183 Chronic kidney disease, stage 3 (moderate): Secondary | ICD-10-CM | POA: Diagnosis not present

## 2017-09-18 ENCOUNTER — Ambulatory Visit: Payer: Medicare HMO | Admitting: Endocrinology

## 2017-09-18 ENCOUNTER — Encounter: Payer: Self-pay | Admitting: Endocrinology

## 2017-09-18 VITALS — BP 142/68 | HR 86 | Ht 63.0 in | Wt 141.0 lb

## 2017-09-18 DIAGNOSIS — E1142 Type 2 diabetes mellitus with diabetic polyneuropathy: Secondary | ICD-10-CM

## 2017-09-18 DIAGNOSIS — I10 Essential (primary) hypertension: Secondary | ICD-10-CM | POA: Diagnosis not present

## 2017-09-18 DIAGNOSIS — E871 Hypo-osmolality and hyponatremia: Secondary | ICD-10-CM | POA: Diagnosis not present

## 2017-09-18 DIAGNOSIS — E875 Hyperkalemia: Secondary | ICD-10-CM | POA: Diagnosis not present

## 2017-09-18 MED ORDER — INSULIN DETEMIR 100 UNIT/ML ~~LOC~~ SOLN: 16.0000 [IU] | SUBCUTANEOUS | 1 refills | Status: DC

## 2017-09-18 MED ORDER — INSULIN REGULAR HUMAN 100 UNIT/ML IJ SOLN: 5.0000 [IU] | INTRAMUSCULAR | 1 refills | Status: DC

## 2017-09-18 NOTE — Patient Instructions (Signed)
Stop lisinopril  Take amlodipine daily  Take INDAPAMIDE every other day

## 2017-09-18 NOTE — Progress Notes (Signed)
Patient ID: Julie Brewer, female   DOB: 1950/04/07, 67 y.o.   MRN: [id number]   Reason for Appointment: Follow-up of various problems  History of Present Illness   Diagnosis: Type 2 DIABETES MELITUS, date of diagnosis:  1985  Prior history: Julie Brewer has been on insulin since diagnosis and on Lantus previously Also at some point had been started on Glucophage several years ago also Because of insurance preference Lantus was changed to Levemir  Julie Brewer refuses to use analog rapid acting insulin because of cost and is using regular insulin for several years   Her blood sugars are generally well controlled and A1c usually under 7% Her A1c previously was higher with stopping metformin at 8%  Recent history:   Insulin regimen: Levemir insulin steam in the morning daily. Regular insulin 30 minutes Before eating, 10 units a.m. and 5 ac supper  Oral hypoglycemic drugs: Metformin ER 750 MG once daily    Her A1c in June was 6.1 and fructosamine is now 229  Current blood sugar patterns, management and problems:  Her Levemir was reduced by 2 units on the last visit because of relatively low lab glucose  Again he is not monitoring blood sugars at home  Julie Brewer did have a high reading after breakfast in the lab couple of weeks ago elsewhere but may have had cereal this morning  Also couple of weeks ago Julie Brewer was off her diet  Recent lab glucose is 102 fasting  Her niece is drawing up her insulin  Side effects from medications: None      Glucometer:  FreeStyle, no home readings available  Meals:  usually 2 meals per day at 10 AM and 5 PM.     Mealtime protein sources:turkey, chicken.  Eating cereal for breakfast at times Avoiding sweet drinks  Physical activity: exercise: Some walking within the house              Microalbumin has been mildly increased previously but most recent was at the bottom normal   Wt Readings from Last 3 Encounters:  09/18/17 141 lb (64 kg)  07/17/17  140 lb 6.4 oz (63.7 kg)  07/10/17 141 lb 12.8 oz (64.3 kg)   Lab Results  Component Value Date   HGBA1C 6.1 07/05/2017   HGBA1C 5.5 03/21/2017   HGBA1C 6.1 12/19/2016   Lab Results  Component Value Date   MICROALBUR 9.0 (H) 03/21/2017   LDLCALC 67 07/05/2017   CREATININE 1.67 (H) 09/13/2017      Lab Results  Component Value Date   FRUCTOSAMINE 229 09/13/2017       Lab on 09/13/2017  Component Date Value Ref Range Status  . Fructosamine 09/13/2017 229  0 - 285 umol/L Final   Comment: Published reference interval for apparently healthy subjects between age 46 and 10 is 38 - 285 umol/L and in a poorly controlled diabetic population is 228 - 563 umol/L with a mean of 396 umol/L.   Marland Kitchen Sodium 09/13/2017 132* 135 - 145 mEq/L Final  . Potassium 09/13/2017 5.3* 3.5 - 5.1 mEq/L Final  . Chloride 09/13/2017 102  96 - 112 mEq/L Final  . CO2 09/13/2017 24  19 - 32 mEq/L Final  . Glucose, Bld 09/13/2017 102* 70 - 99 mg/dL Final  . BUN 09/13/2017 22  6 - 23 mg/dL Final  . Creatinine, Ser 09/13/2017 1.67* 0.40 - 1.20 mg/dL Final  . Calcium 09/13/2017 9.9  8.4 - 10.5 mg/dL Final  . GFR 09/13/2017 39.60* >60.00  mL/min Final    Allergies as of 09/18/2017      Reactions   Morphine And Related Other (See Comments)   Hallucenations    Penicillins Rash, Other (See Comments)   Swelling      Medication List        Accurate as of 09/18/17  2:03 PM. Always use your most recent med list.          amLODipine 10 MG tablet Commonly known as:  NORVASC Take 10 mg by mouth daily.   aspirin 325 MG EC tablet Take 325 mg by mouth 2 (two) times daily.   atorvastatin 20 MG tablet Commonly known as:  LIPITOR TAKE 1 TABLET BY MOUTH ONCE DAILY   cilostazol 100 MG tablet Commonly known as:  PLETAL TAKE 1 TABLET BY MOUTH TWICE DAILY   gabapentin 100 MG capsule Commonly known as:  NEURONTIN Take 1 capsule (100 mg total) by mouth 2 (two) times daily.   glucose blood test strip Use as  instructed to check blood sugar 2 times a day dx code E11.9   indapamide 1.25 MG tablet Commonly known as:  LOZOL TAKE 1 TABLET BY MOUTH ONCE DAILY   insulin regular 100 units/mL injection Commonly known as:  NOVOLIN R,HUMULIN R Inject 5-10 Units into the skin See admin instructions. 10 units in the morning and 5-7 units in the evening.   IRON PO Take 1 tablet by mouth daily.   LEVEMIR 100 UNIT/ML injection Generic drug:  insulin detemir Inject 16 Units into the skin every morning.   lisinopril 40 MG tablet Commonly known as:  PRINIVIL,ZESTRIL Take 40 mg by mouth daily.   metFORMIN 750 MG 24 hr tablet Commonly known as:  GLUCOPHAGE-XR TAKE 1 TABLET BY MOUTH ONCE DAILY   omeprazole 20 MG capsule Commonly known as:  PRILOSEC TAKE 1 CAPSULE BY MOUTH ONCE DAILY (DUE  FOR  OFFICE  VISIT  THIS  MONTH)   Vitamin D (Ergocalciferol) 50000 units Caps capsule Commonly known as:  DRISDOL TAKE ONE CAPSULE BY MOUTH EVERY 7 DAYS       Allergies:  Allergies  Allergen Reactions  . Morphine And Related Other (See Comments)    Hallucenations   . Penicillins Rash and Other (See Comments)    Swelling    Past Medical History:  Diagnosis Date  . Asthma    No probnlems recently  . 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 Diabetes mellitus    Type II per Dr [name]  (patient said type I)  . Fibroid   . GERD (gastroesophageal reflux disease)   . Glaucoma   . Hyperlipidemia   . Hypertension   . Iron deficiency anemia 03/09/2016  . Peripheral vascular disease ([place])   . Pneumonia 2006  . Shortness of breath dyspnea    with exdrtion, "Walkling too fast"  . Stroke [facility])    no residual    Past Surgical History:  Procedure Laterality Date  . ABDOMINAL HYSTERECTOMY    . BIOPSY  06/10/2017   Procedure: BIOPSY;  Surgeon: Ronnette Juniper, MD;  Location: [place];  Service: Gastroenterology;;  . CERVICAL FUSION     with graft from hip  .  COLONOSCOPY  July 09, 2012  . DIRECT LARYNGOSCOPY N/A 06/07/2014   Procedure: DIRECT LARYNGOSCOPY with BIOPSY and EXCISION VOLLECULAR CYST;  Surgeon: Ruby Cola, MD;  Location: [place];  Service: ENT;  Laterality: N/A;  . ESOPHAGOGASTRODUODENOSCOPY (  EGD) WITH PROPOFOL Left 06/10/2017   Procedure: ESOPHAGOGASTRODUODENOSCOPY (EGD) WITH PROPOFOL;  Surgeon: Ronnette Juniper, MD;  Location: [place];  Service: Gastroenterology;  Laterality: Left;  . FLEXIBLE SIGMOIDOSCOPY Left 06/10/2017   Procedure: FLEXIBLE SIGMOIDOSCOPY;  Surgeon: Ronnette Juniper, MD;  Location: [place];  Service: Gastroenterology;  Laterality: Left;  . LOOP RECORDER INSERTION N/A 06/20/2016   Procedure: Loop Recorder Insertion;  Surgeon: [name], MD;  Location: [place] [facility];  Service: Cardiovascular;  Laterality: N/A;  . REFRACTIVE SURGERY Bilateral    both eyes  . SPINE SURGERY     lumbar    Family History  Problem Relation Age of Onset  . Cancer Mother   . Heart disease Mother   . Diabetes Father   . Cancer Brother   . Cancer Brother   . Throat cancer Brother     Social History:  reports that Julie Brewer has been smoking cigarettes. Julie Brewer has a 30.00 pack-year smoking history. Julie Brewer has never used smokeless tobacco. Julie Brewer reports that Julie Brewer drinks alcohol. Julie Brewer reports that Julie Brewer does not use drugs.  Review of Systems:    Blindness: Julie Brewer has absent vision on the right side and can see silhouettes only on the left.    HYPERTENSION:  Treated with amlodipine 10 mg, indapamide 1.25 and lisinopril 20 mg dose. In June Julie Brewer was told to take half lisinopril because of low normal standing blood pressure Also had a higher creatinine  On reviewing her medication history Julie Brewer says that Julie Brewer is taking amlodipine every other day and indapamide daily   BP Readings from Last 3 Encounters:  09/18/17 (!) 142/68  07/17/17 (!) 159/66  07/10/17 140/66     Renal function: Her creatinine is good to be higher compared to her last visit,  Julie Brewer saw the nephrologist yesterday and no recommendations made Now also her potassium is high  Lab Results  Component Value Date   CREATININE 1.67 (H) 09/13/2017   CREATININE 1.39 (H) 07/17/2017   CREATININE 1.45 (H) 07/05/2017    HYPONATREMIA: This has been from indapamide  Lab Results  Component Value Date   NA 132 (L) 09/13/2017   K 5.3 (H) 09/13/2017   CL 102 09/13/2017   CO2 24 09/13/2017     HYPERLIPIDEMIA: The lipid abnormality consists of elevated LDL controlled with Lipitor 20 mg .   Her baseline LDL was 202 Her last LDL is as follows:  Lab Results  Component Value Date   CHOL 150 07/05/2017   HDL 52.90 07/05/2017   LDLCALC 67 07/05/2017   TRIG 155.0 (H) 07/05/2017   CHOLHDL 3 07/05/2017    Vitamin D: Taking 50,000 unit dosage once a month This is monitored by PCP    Lab Results  Component Value Date   VD25OH 40.22 09/13/2016       Last diabetic foot exam was done in 6/19  Takes Gabapentin regularly twice daily with control of her leg pains    Examination:   BP (!) 142/68   Pulse 86   Ht 5\' 3"  (1.6 m)   Wt 141 lb (64 kg)   SpO2 98%   BMI 24.98 kg/m   Body mass index is 24.98 kg/m.    No pedal edema  ASSESSMENT/ PLAN:   HYPERTENSION:   Her blood pressure is quite normal even with reducing her lisinopril to 20 mg and her taking her Norvasc only every other day by mistake Julie Brewer does tend to have a lower standing blood pressure possibly from some autonomic  neuropathy but also being on diuretics  Currently her main difficulties with blood pressure medication management are with RENAL dysfunction worse than before Also now has a mildly increased potassium, likely worse because of renal dysfunction  For this reason do not think that taking lisinopril is appropriate  Recommendations:  Would like to stop her lisinopril for now  Julie Brewer will take amlodipine every day  Julie Brewer will go back to taking indapamide every other day  Needs to have  renal function potassium and blood pressure check in about a month and recommended that Julie Brewer contact the nephrologist for follow-up  Diabetes type 2 on insulin  As before Julie Brewer is monitored monitoring at home Recent fasting glucose was 102 and Julie Brewer will continue the same reduced dose of 16 units of Levemir   HYPONATREMIA: Her sodium is low again with her taking indapamide daily instead of every other day which Julie Brewer was doing previously Julie Brewer will take indapamide every other day no    There are no Patient Instructions on file for this visit.  Elayne Snare 09/18/2017, 2:03 PM     Note: This office note was prepared with Dragon voice recognition system technology. Any transcriptional errors that result from this process are unintentional.

## 2017-09-19 ENCOUNTER — Telehealth: Payer: Self-pay | Admitting: Endocrinology

## 2017-09-19 ENCOUNTER — Encounter: Payer: Self-pay | Admitting: Endocrinology

## 2017-09-19 NOTE — Telephone Encounter (Signed)
[facility] called (ph#[phone])-Received orders that Dr. [name] has that he wants patient to be seen there in 2-3 weeks. Patient was just there on 09/17/17. Are these orders new or has patient already been seen by [facility]. Please call [facility] at the above ph# to advise.

## 2017-09-23 NOTE — Telephone Encounter (Signed)
When does this patient need to be scheduled for our office and does she need to make another appointment for [facility]- please advise

## 2017-09-23 NOTE — Telephone Encounter (Signed)
My comments below are regarding her being seen by nephrologist at [facility] and she will keep her normal appointment here

## 2017-09-23 NOTE — Telephone Encounter (Signed)
I would like her to be seen again because her kidney function was worse and potassium was high

## 2017-09-23 NOTE — Telephone Encounter (Signed)
Patient was just seen at [facility] on 09/17/17 does she need to go back to [facility] in 2-3 weeks or was this an old order please advise

## 2017-09-24 ENCOUNTER — Ambulatory Visit (HOSPITAL_COMMUNITY)
Admission: RE | Admit: 2017-09-24 | Discharge: 2017-09-24 | Disposition: A | Discharge status: Home / Self Care | Payer: Medicare HMO | Source: Ambulatory Visit | Attending: Family | Admitting: Family

## 2017-09-24 ENCOUNTER — Ambulatory Visit: Payer: Medicare HMO | Admitting: Family

## 2017-09-24 ENCOUNTER — Encounter: Payer: Self-pay | Admitting: Family

## 2017-09-24 ENCOUNTER — Other Ambulatory Visit: Payer: Self-pay

## 2017-09-24 VITALS — BP 145/71 | HR 84 | Resp 18 | Ht 63.0 in | Wt 144.0 lb

## 2017-09-24 DIAGNOSIS — E1151 Type 2 diabetes mellitus with diabetic peripheral angiopathy without gangrene: Secondary | ICD-10-CM

## 2017-09-24 DIAGNOSIS — I779 Disorder of arteries and arterioles, unspecified: Secondary | ICD-10-CM | POA: Diagnosis not present

## 2017-09-24 DIAGNOSIS — F17200 Nicotine dependence, unspecified, uncomplicated: Secondary | ICD-10-CM | POA: Diagnosis present

## 2017-09-24 DIAGNOSIS — R69 Illness, unspecified: Secondary | ICD-10-CM | POA: Diagnosis not present

## 2017-09-24 DIAGNOSIS — I10 Essential (primary) hypertension: Secondary | ICD-10-CM | POA: Insufficient documentation

## 2017-09-24 DIAGNOSIS — I70212 Atherosclerosis of native arteries of extremities with intermittent claudication, left leg: Secondary | ICD-10-CM | POA: Diagnosis not present

## 2017-09-24 NOTE — Patient Instructions (Signed)
Steps to Quit Smoking Smoking tobacco can be bad for your health. It can also affect almost every organ in your body. Smoking puts you and people around you at risk for many serious long-lasting (chronic) diseases. Quitting smoking is hard, but it is one of the best things that you can do for your health. It is never too late to quit. What are the benefits of quitting smoking? When you quit smoking, you lower your risk for getting serious diseases and conditions. They can include:  Lung cancer or lung disease.  Heart disease.  Stroke.  Heart attack.  Not being able to have children (infertility).  Weak bones (osteoporosis) and broken bones (fractures).  If you have coughing, wheezing, and shortness of breath, those symptoms may get better when you quit. You may also get sick less often. If you are pregnant, quitting smoking can help to lower your chances of having a baby of low birth weight. What can I do to help me quit smoking? Talk with your doctor about what can help you quit smoking. Some things you can do (strategies) include:  Quitting smoking totally, instead of slowly cutting back how much you smoke over a period of time.  Going to in-person counseling. You are more likely to quit if you go to many counseling sessions.  Using resources and support systems, such as: ? Online chats with a counselor. ? Phone quitlines. ? Printed self-help materials. ? Support groups or group counseling. ? Text messaging programs. ? Mobile phone apps or applications.  Taking medicines. Some of these medicines may have nicotine in them. If you are pregnant or breastfeeding, do not take any medicines to quit smoking unless your doctor says it is okay. Talk with your doctor about counseling or other things that can help you.  Talk with your doctor about using more than one strategy at the same time, such as taking medicines while you are also going to in-person counseling. This can help make  quitting easier. What things can I do to make it easier to quit? Quitting smoking might feel very hard at first, but there is a lot that you can do to make it easier. Take these steps:  Talk to your family and friends. Ask them to support and encourage you.  Call phone quitlines, reach out to support groups, or work with a counselor.  Ask people who smoke to not smoke around you.  Avoid places that make you want (trigger) to smoke, such as: ? Bars. ? Parties. ? Smoke-break areas at work.  Spend time with people who do not smoke.  Lower the stress in your life. Stress can make you want to smoke. Try these things to help your stress: ? Getting regular exercise. ? Deep-breathing exercises. ? Yoga. ? Meditating. ? Doing a body scan. To do this, close your eyes, focus on one area of your body at a time from head to toe, and notice which parts of your body are tense. Try to relax the muscles in those areas.  Download or buy apps on your mobile phone or tablet that can help you stick to your quit plan. There are many free apps, such as QuitGuide from the CDC (Centers for Disease Control and Prevention). You can find more support from smokefree.gov and other websites.  This information is not intended to replace advice given to you by your health care provider. Make sure you discuss any questions you have with your health care provider. Document Released: 11/11/2008 Document   Revised: 09/13/2015 Document Reviewed: 06/01/2014 Elsevier Interactive Patient Education  2018 Elsevier Inc.     Peripheral Vascular Disease Peripheral vascular disease (PVD) is a disease of the blood vessels that are not part of your heart and brain. A simple term for PVD is poor circulation. In most cases, PVD narrows the blood vessels that carry blood from your heart to the rest of your body. This can result in a decreased supply of blood to your arms, legs, and internal organs, like your stomach or kidneys.  However, it most often affects a person's lower legs and feet. There are two types of PVD.  Organic PVD. This is the more common type. It is caused by damage to the structure of blood vessels.  Functional PVD. This is caused by conditions that make blood vessels contract and tighten (spasm).  Without treatment, PVD tends to get worse over time. PVD can also lead to acute ischemic limb. This is when an arm or limb suddenly has trouble getting enough blood. This is a medical emergency. Follow these instructions at home:  Take medicines only as told by your doctor.  Do not use any tobacco products, including cigarettes, chewing tobacco, or electronic cigarettes. If you need help quitting, ask your doctor.  Lose weight if you are overweight, and maintain a healthy weight as told by your doctor.  Eat a diet that is low in fat and cholesterol. If you need help, ask your doctor.  Exercise regularly. Ask your doctor for some good activities for you.  Take good care of your feet. ? Wear comfortable shoes that fit well. ? Check your feet often for any cuts or sores. Contact a doctor if:  You have cramps in your legs while walking.  You have leg pain when you are at rest.  You have coldness in a leg or foot.  Your skin changes.  You are unable to get or have an erection (erectile dysfunction).  You have cuts or sores on your feet that are not healing. Get help right away if:  Your arm or leg turns cold and blue.  Your arms or legs become red, warm, swollen, painful, or numb.  You have chest pain or trouble breathing.  You suddenly have weakness in your face, arm, or leg.  You become very confused or you cannot speak.  You suddenly have a very bad headache.  You suddenly cannot see. This information is not intended to replace advice given to you by your health care provider. Make sure you discuss any questions you have with your health care provider. Document Released:  04/11/2009 Document Revised: 06/23/2015 Document Reviewed: 06/25/2013 Elsevier Interactive Patient Education  2017 Elsevier Inc.  

## 2017-09-24 NOTE — Progress Notes (Signed)
[facility] [place]   CC: Follow up peripheral artery occlusive disease  History of Present Illness Julie Brewer is a 66 y.o. female whom Dr. Brabham has been monitoring for peripheral artery occlusive disease.   When Dr. Brabham met her in 2011 she was having claudication in her left leg at approximately one block. We treated her with cilostazol, and she did much better.  She no longer has left calf heavy feeling after walking 10 minutes, no more claudication, denies non healing wounds.  She states she had a TIA in about 2010 as manifested by slurred speech that resolved, denies hemiparesis, denies further loss of vision. She states that her loss of vision in both eyes is due to glaucoma.  She continues to be medically managed for hypertension and hyperlipidemia as well as her diabetes. She is on a statin as well as an ACE inhibitor.  She continues on Pletal.    Pt Diabetic: Yes, her last A1C was 6.1 on 07-05-17 (review of records) Pt smoker: smoker (1 ppd, started at age 17 yrs)  Pt meds include: Statin :Yes ASA: Yes, 325 mg daily Other anticoagulants/antiplatelets: no   Past Medical History:  Diagnosis Date  . Asthma    No probnlems recently  . 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 Diabetes mellitus    Type II per Dr Kumar  (patient said type I)  . Fibroid   . GERD (gastroesophageal reflux disease)   . Glaucoma   . Hyperlipidemia   . Hypertension   . Iron deficiency anemia 03/09/2016  . Peripheral vascular disease ([facility])   . Pneumonia 2006  . Shortness of breath dyspnea    with exdrtion, "Walkling too fast"  . Stroke ([facility])    no residual    Social History Social History   Tobacco Use  . Smoking status: Current Every Day Smoker    Packs/day: 1.00    Years: 30.00    Pack years: 30.00    Types: Cigarettes  . Smokeless tobacco: Never Used  Substance Use Topics  . Alcohol  use: Yes    Comment: socially  . Drug use: No    Family History Family History  Problem Relation Age of Onset  . Cancer Mother   . Heart disease Mother   . Diabetes Father   . Cancer Brother   . Cancer Brother   . Throat cancer Brother     Past Surgical History:  Procedure Laterality Date  . ABDOMINAL HYSTERECTOMY    . BIOPSY  06/10/2017   Procedure: BIOPSY;  Surgeon: Karki, Arya, MD;  Location: [facility];  Service: Gastroenterology;;  . CERVICAL FUSION     with graft from hip  . COLONOSCOPY  July 09, 2012  . DIRECT LARYNGOSCOPY N/A 06/07/2014   Procedure: DIRECT LARYNGOSCOPY with BIOPSY and EXCISION VOLLECULAR CYST;  Surgeon: Mitchell Gore, MD;  Location: [facility];  Service: ENT;  Laterality: N/A;  . ESOPHAGOGASTRODUODENOSCOPY (EGD) WITH PROPOFOL Left 06/10/2017   Procedure: ESOPHAGOGASTRODUODENOSCOPY (EGD) WITH PROPOFOL;  Surgeon: Karki, Arya, MD;  Location: [facility];  Service: Gastroenterology;  Laterality: Left;  . FLEXIBLE SIGMOIDOSCOPY Left 06/10/2017   Procedure: FLEXIBLE SIGMOIDOSCOPY;  Surgeon: Karki, Arya, MD;  Location: [facility];  Service: Gastroenterology;  Laterality: Left;  . LOOP RECORDER INSERTION N/A 06/20/2016   Procedure: Loop Recorder Insertion;  Surgeon: Croitoru, Mihai, MD;  [facility];  Service: Cardiovascular;  Laterality:   N/A;  . REFRACTIVE SURGERY Bilateral    both eyes  . SPINE SURGERY     lumbar    Allergies  Allergen Reactions  . Morphine And Related Other (See Comments)    Hallucenations   . Penicillins Rash and Other (See Comments)    Swelling    Current Outpatient Medications  Medication Sig Dispense Refill  . amLODipine (NORVASC) 10 MG tablet Take 10 mg by mouth daily.     Marland Kitchen aspirin 325 MG EC tablet Take 325 mg by mouth 2 (two) times daily.     Marland Kitchen atorvastatin (LIPITOR) 20 MG tablet TAKE 1 TABLET BY MOUTH ONCE DAILY 90 tablet 3  . cilostazol (PLETAL) 100 MG tablet TAKE 1 TABLET BY MOUTH TWICE DAILY 60 tablet 6  .  gabapentin (NEURONTIN) 100 MG capsule Take 1 capsule (100 mg total) by mouth 2 (two) times daily. 180 capsule 1  . glucose blood (FREESTYLE LITE) test strip Use as instructed to check blood sugar 2 times a day dx code E11.9 200 each 1  . indapamide (LOZOL) 1.25 MG tablet TAKE 1 TABLET BY MOUTH ONCE DAILY 30 tablet 3  . insulin detemir (LEVEMIR) 100 UNIT/ML injection Inject 0.16 mLs (16 Units total) into the skin every morning. 10 mL 1  . insulin regular (NOVOLIN R,HUMULIN R) 100 units/mL injection Inject 0.05-0.1 mLs (5-10 Units total) into the skin See admin instructions. 10 units in the morning and 5-7 units in the evening. 10 mL 1  . IRON PO Take 1 tablet by mouth daily.     . metFORMIN (GLUCOPHAGE-XR) 750 MG 24 hr tablet TAKE 1 TABLET BY MOUTH ONCE DAILY 90 tablet 0  . omeprazole (PRILOSEC) 20 MG capsule TAKE 1 CAPSULE BY MOUTH ONCE DAILY (DUE  FOR  OFFICE  VISIT  THIS  MONTH) 90 capsule 2  . Vitamin D, Ergocalciferol, (DRISDOL) 50000 units CAPS capsule TAKE ONE CAPSULE BY MOUTH EVERY 7 DAYS 12 capsule 0   No current facility-administered medications for this visit.     ROS: See HPI for pertinent positives and negatives.   Physical Examination  Vitals:   09/24/17 1236  BP: (!) 145/71  Pulse: 84  Resp: 18  SpO2: 100%  Weight: 144 lb (65.3 kg)  Height: 5' 3" (1.6 m)   Body mass index is 25.51 kg/m.  General: A&O x 3, WDWN. Gait: normal, using cane for the blind, led by her daughter Eyes: Pupils are unequal and non reactive to light; right eye surface is clouded. Pulmonary: Respirations are non labored, limited air movement in right anterior and posterior fields, without wheezes, rales, or rhonchi. Cardiac: regular rhythm, no detected murmur.     Carotid Bruits Right Left   Negative Negative   Abdominal aortic pulse is not palpable. Radial pulses: are 2+ palpable and =.   VASCULAR EXAM: Extremitieswithout ischemic changes  without  Gangrene; without open wounds.     LE Pulses Right Left   FEMORAL 2+ palpable 1+ palpable    POPLITEAL not palpable  not palpable   POSTERIOR TIBIAL 2+palpable  not palpable    DORSALIS PEDIS  ANTERIOR TIBIAL 2+ palpable  faintly palpable    Abdomen: soft, NT, no palpable masses. Musculoskeletal: no muscle wasting or atrophy. Neurologic: A&O X 3; Appropriate Affect, MOTOR FUNCTION: moving all extremities equally, motor strength 5/5 in UE's, 4/5 in LE's. Speech is fluent/normal. CN 2-12 intact except for loss of vision. Skin: no rashes, no cellulitis, no ulcers noted. Psychiatric: Thought content is  normal, mood appropriate for clinical situation.     ASSESSMENT: Julie Brewer is a 67 y.o. female who has a history ofmild left calf claudication, but currently denies claudication symptoms. She climbs and descends stairs several times/day, may not be walking much due to her inability to see, is blind. She therefore may not walk enough to elicit claudication symptoms.   Her atherosclerotic risk factors include currently controlled DM and active smoking. She states that her DM has come under control since she has been under the care of Dr. [name], endocrinologist. She states her DM was uncontrolled for a long time prior to this.  She does not walk much since she is almost blind. See Plan.  She states that Pletal likely helps prevent her claudication.  Limited air movement in all right lung fields, pt denies any more dyspnea than usual, she denies any history of lung surgery. I advised her to speak with her PCP re this. She currently smokes a ppd, started at age 72.   DATA  ABI (Date: 09/24/2017):  R:   ABI: 1.07 (was 1.04 on 09-24-16),   PT: tri  DP: tri  TBI:  0.71 (was  0.80)  L:   ABI: 0.71 (was 0.73),   PT: bi  DP: tri  TBI: 0.61, toe pressure 92 (was 0.64) Bilateral ABI's remains stable: right is normal with triphasic waveforms, left with moderate arterial occlusive disease, tri and biphasic waveforms.      PLAN:  The patient was counseled re smoking cessation and given several free resources re smoking cessation.  Daily seated leg exercises demonstrated and discussed. Based on the patient's vascular studies and examination, pt will return to clinic in 1 year with ABI's.   I advised her and her daughter to notify us if she develops concerns re the circulation in her feet/legs.   I discussed in depth with the patient the nature of atherosclerosis, and emphasized the importance of maximal medical management including strict control of blood pressure, blood glucose, and lipid levels, obtaining regular exercise, and cessation of smoking.  The patient is aware that without maximal medical management the underlying atherosclerotic disease process will progress, limiting the benefit of any interventions.  The patient was given information about PAD including signs, symptoms, treatment, what symptoms should prompt the patient to seek immediate medical care, and risk reduction measures to take.  Clemon Chambers, RN, MSN, FNP-C [facility] Phone: [phone]  Clinic MD: Bishop Dublin  09/24/17 12:59 PM

## 2017-10-02 ENCOUNTER — Other Ambulatory Visit: Payer: Self-pay | Admitting: Endocrinology

## 2017-10-02 ENCOUNTER — Ambulatory Visit: Payer: Medicare Other | Admitting: Orthotics

## 2017-10-02 ENCOUNTER — Ambulatory Visit (INDEPENDENT_AMBULATORY_CARE_PROVIDER_SITE_OTHER): Payer: Medicare HMO | Admitting: *Deleted

## 2017-10-02 DIAGNOSIS — Q828 Other specified congenital malformations of skin: Secondary | ICD-10-CM

## 2017-10-02 DIAGNOSIS — M2012 Hallux valgus (acquired), left foot: Secondary | ICD-10-CM | POA: Diagnosis not present

## 2017-10-02 DIAGNOSIS — R55 Syncope and collapse: Secondary | ICD-10-CM | POA: Diagnosis not present

## 2017-10-02 DIAGNOSIS — E1151 Type 2 diabetes mellitus with diabetic peripheral angiopathy without gangrene: Secondary | ICD-10-CM

## 2017-10-02 DIAGNOSIS — M2011 Hallux valgus (acquired), right foot: Secondary | ICD-10-CM | POA: Diagnosis not present

## 2017-10-03 NOTE — Progress Notes (Signed)
[facility] Summary Report / Loop Recorder 

## 2017-10-03 NOTE — Progress Notes (Signed)

## 2017-10-07 LAB — CUP PACEART REMOTE DEVICE CHECK
MDC IDC PG IMPLANT DT: 20180523
MDC IDC SESS DTM: 20190803000917

## 2017-10-08 ENCOUNTER — Telehealth: Payer: Self-pay | Admitting: Endocrinology

## 2017-10-08 MED ORDER — INSULIN GLARGINE 100 UNIT/ML SOLOSTAR PEN: PEN_INJECTOR | SUBCUTANEOUS | 0 refills | Status: DC

## 2017-10-08 NOTE — Telephone Encounter (Signed)
Printed and placed for Dr. [name] to sign. Pt did not want this sent electronically

## 2017-10-08 NOTE — Telephone Encounter (Signed)
insulin detemir (LEVEMIR) 100 UNIT/ML injection   Patients daughter stated that patients new insurance will not cover this insulin That she needs a new prescriptionfor Lantus to her pharmacy Patients daughter would like a prescription printed so she can pick this up from the office,since patient has close to a month supply already of the levemir.    Please advise    [place], [place] - [address]

## 2017-10-10 ENCOUNTER — Other Ambulatory Visit: Payer: Self-pay | Admitting: Endocrinology

## 2017-10-10 MED ORDER — INSULIN GLARGINE 100 UNIT/ML ~~LOC~~ SOLN: 14.0000 [IU] | Freq: Every day | SUBCUTANEOUS | 3 refills | Status: DC

## 2017-10-25 LAB — CUP PACEART REMOTE DEVICE CHECK
Date Time Interrogation Session: 20190905000950
Implantable Pulse Generator Implant Date: 20180523

## 2017-11-03 ENCOUNTER — Other Ambulatory Visit: Payer: Self-pay | Admitting: Endocrinology

## 2017-11-04 ENCOUNTER — Inpatient Hospital Stay: Payer: Medicare Other | Admitting: Nurse Practitioner

## 2017-11-04 ENCOUNTER — Inpatient Hospital Stay: Payer: Medicare Other | Attending: Family Medicine

## 2017-11-04 ENCOUNTER — Ambulatory Visit (INDEPENDENT_AMBULATORY_CARE_PROVIDER_SITE_OTHER): Payer: Medicare Other | Admitting: *Deleted

## 2017-11-04 DIAGNOSIS — R55 Syncope and collapse: Secondary | ICD-10-CM

## 2017-11-05 NOTE — Progress Notes (Signed)
[facility] Summary Report / Loop Recorder 

## 2017-11-11 ENCOUNTER — Ambulatory Visit (INDEPENDENT_AMBULATORY_CARE_PROVIDER_SITE_OTHER): Payer: Medicare Other | Admitting: Podiatry

## 2017-11-11 DIAGNOSIS — Q828 Other specified congenital malformations of skin: Secondary | ICD-10-CM | POA: Diagnosis not present

## 2017-11-11 DIAGNOSIS — B351 Tinea unguium: Secondary | ICD-10-CM | POA: Diagnosis not present

## 2017-11-11 DIAGNOSIS — Z23 Encounter for immunization: Secondary | ICD-10-CM | POA: Diagnosis not present

## 2017-11-11 DIAGNOSIS — E1151 Type 2 diabetes mellitus with diabetic peripheral angiopathy without gangrene: Secondary | ICD-10-CM | POA: Diagnosis not present

## 2017-11-11 NOTE — Progress Notes (Signed)
Subjective:  Patient ID: Julie Brewer, female    DOB: May 13, 1950,  MRN: [id number]  Chief Complaint  Patient presents with  . debride    BL nails and callus trimming -FBS: "don't chek" A1C" 6.1 PCP: Justin Mend x May    67 y.o. female presents  for diabetic foot care. Last AMBS was unknown. Reports numbness and tingling in their feet. Denies cramping in legs and thighs.  Review of Systems: Negative except as noted in the HPI. Denies N/V/F/Ch.  Past Medical History:  Diagnosis Date  . Asthma    No probnlems recently  . 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 Diabetes mellitus    Type II per Dr [name]  (patient said type I)  . Fibroid   . GERD (gastroesophageal reflux disease)   . Glaucoma   . Hyperlipidemia   . Hypertension   . Iron deficiency anemia 03/09/2016  . Peripheral vascular disease ([place])   . Pneumonia 2006  . Shortness of breath dyspnea    with exdrtion, "Walkling too fast"  . Stroke [facility])    no residual    Current Outpatient Medications:  .  amLODipine (NORVASC) 10 MG tablet, Take 10 mg by mouth daily. , Disp: , Rfl:  .  aspirin 325 MG EC tablet, Take 325 mg by mouth 2 (two) times daily. , Disp: , Rfl:  .  atorvastatin (LIPITOR) 20 MG tablet, TAKE 1 TABLET BY MOUTH ONCE DAILY, Disp: 90 tablet, Rfl: 3 .  cilostazol (PLETAL) 100 MG tablet, TAKE 1 TABLET BY MOUTH TWICE DAILY, Disp: 60 tablet, Rfl: 6 .  gabapentin (NEURONTIN) 100 MG capsule, Take 1 capsule (100 mg total) by mouth 2 (two) times daily., Disp: 180 capsule, Rfl: 1 .  glucose blood (FREESTYLE LITE) test strip, Use as instructed to check blood sugar 2 times a day dx code E11.9, Disp: 200 each, Rfl: 1 .  indapamide (LOZOL) 1.25 MG tablet, TAKE 1 TABLET BY MOUTH ONCE DAILY, Disp: 30 tablet, Rfl: 3 .  insulin glargine (LANTUS) 100 UNIT/ML injection, Inject 0.14 mLs (14 Units total) into the skin daily., Disp: 10 mL, Rfl: 3 .  insulin regular (NOVOLIN R,HUMULIN  R) 100 units/mL injection, Inject 0.05-0.1 mLs (5-10 Units total) into the skin See admin instructions. 10 units in the morning and 5-7 units in the evening., Disp: 10 mL, Rfl: 1 .  IRON PO, Take 1 tablet by mouth daily. , Disp: , Rfl:  .  metFORMIN (GLUCOPHAGE-XR) 750 MG 24 hr tablet, TAKE 1 TABLET BY MOUTH ONCE DAILY, Disp: 90 tablet, Rfl: 0 .  omeprazole (PRILOSEC) 20 MG capsule, TAKE 1 CAPSULE BY MOUTH ONCE DAILY (DUE  FOR  OFFICE  VISIT  THIS  MONTH), Disp: 90 capsule, Rfl: 2 .  Vitamin D, Ergocalciferol, (DRISDOL) 50000 units CAPS capsule, TAKE ONE CAPSULE BY MOUTH EVERY 7 DAYS, Disp: 12 capsule, Rfl: 0  Social History   Tobacco Use  Smoking Status Current Every Day Smoker  . Packs/day: 1.00  . Years: 30.00  . Pack years: 30.00  . Types: Cigarettes  Smokeless Tobacco Never Used    Allergies  Allergen Reactions  . Morphine And Related Other (See Comments)    Hallucenations   . Penicillins Rash and Other (See Comments)    Swelling   Objective:  There were no vitals filed for this visit. There is no height or weight on file to calculate BMI. Constitutional Well developed. Well  nourished.  Vascular Dorsalis pedis pulses present 1+ bilaterally  Posterior tibial pulses absent bilaterally  Pedal hair growth absent. Capillary refill normal to all digits.  No cyanosis or clubbing noted.  Neurologic Normal speech. Oriented to person, place, and time. Epicritic sensation to light touch grossly present bilaterally. Protective sensation with 5.07 monofilament  present bilaterally. Vibratory sensation present bilaterally.  Dermatologic Nails elongated, thickened, dystrophic. No open wounds. HPK R submet 2,5  Orthopedic: Normal joint ROM without pain or crepitus bilaterally. No visible deformities. No bony tenderness.   Assessment:   1. Diabetes mellitus type 2 with peripheral artery disease ([place])   2. Onychomycosis   3. Porokeratosis    Plan:  Patient was evaluated and  treated and all questions answered.  Diabetes with PAD, Onychomycosis -Educated on diabetic footcare. Diabetic risk level 1 -Nails x10 debrided sharply and manually with large nail nipper and rotary burr.   Procedure: Nail Debridement Rationale: Patient meets criteria for routine foot care due to PAD Type of Debridement: manual, sharp debridement. Instrumentation: Nail nipper, rotary burr. Number of Nails: 10   Procedure: Paring of Lesion Rationale: painful hyperkeratotic lesion Type of Debridement: manual, sharp debridement. Instrumentation: 312 blade Number of Lesions: 2   Return in about 3 months (around 02/11/2018) for Diabetic Foot Care.

## 2017-11-13 ENCOUNTER — Ambulatory Visit: Payer: Medicare HMO | Admitting: Podiatry

## 2017-11-18 LAB — CUP PACEART REMOTE DEVICE CHECK
Implantable Pulse Generator Implant Date: 20180523
MDC IDC SESS DTM: 20191008003819

## 2017-12-06 ENCOUNTER — Other Ambulatory Visit (INDEPENDENT_AMBULATORY_CARE_PROVIDER_SITE_OTHER): Payer: Medicare Other

## 2017-12-06 DIAGNOSIS — E1142 Type 2 diabetes mellitus with diabetic polyneuropathy: Secondary | ICD-10-CM | POA: Diagnosis not present

## 2017-12-06 LAB — COMPREHENSIVE METABOLIC PANEL
ALT: 11 U/L (ref 0–35)
AST: 13 U/L (ref 0–37)
Albumin: 3.6 g/dL (ref 3.5–5.2)
Alkaline Phosphatase: 95 U/L (ref 39–117)
BILIRUBIN TOTAL: 0.2 mg/dL (ref 0.2–1.2)
BUN: 22 mg/dL (ref 6–23)
CALCIUM: 9.2 mg/dL (ref 8.4–10.5)
CHLORIDE: 106 meq/L (ref 96–112)
CO2: 24 meq/L (ref 19–32)
Creatinine, Ser: 1.71 mg/dL — ABNORMAL HIGH (ref 0.40–1.20)
GFR: 38.51 mL/min — AB (ref >60.00)
Glucose, Bld: 99 mg/dL (ref 70–99)
POTASSIUM: 4.6 meq/L (ref 3.5–5.1)
Sodium: 136 mEq/L (ref 135–145)
Total Protein: 6.7 g/dL (ref 6.0–8.3)

## 2017-12-06 LAB — MICROALBUMIN / CREATININE URINE RATIO
CREATININE, U: 59.3 mg/dL
MICROALB UR: 154.4 mg/dL — AB (ref 0.0–1.9)
MICROALB/CREAT RATIO: 260.5 mg/g — AB (ref 0.0–30.0)

## 2017-12-06 LAB — HEMOGLOBIN A1C: Hgb A1c MFr Bld: 6.6 % — ABNORMAL HIGH (ref 4.6–6.5)

## 2017-12-09 ENCOUNTER — Ambulatory Visit (INDEPENDENT_AMBULATORY_CARE_PROVIDER_SITE_OTHER): Payer: Medicare Other | Admitting: Endocrinology

## 2017-12-09 ENCOUNTER — Ambulatory Visit (INDEPENDENT_AMBULATORY_CARE_PROVIDER_SITE_OTHER): Payer: Medicare Other | Admitting: *Deleted

## 2017-12-09 ENCOUNTER — Encounter: Payer: Self-pay | Admitting: Endocrinology

## 2017-12-09 VITALS — BP 140/50 | HR 101 | Ht 63.0 in | Wt 148.0 lb

## 2017-12-09 DIAGNOSIS — I779 Disorder of arteries and arterioles, unspecified: Secondary | ICD-10-CM | POA: Diagnosis not present

## 2017-12-09 DIAGNOSIS — N183 Chronic kidney disease, stage 3 (moderate): Secondary | ICD-10-CM | POA: Diagnosis not present

## 2017-12-09 DIAGNOSIS — E1122 Type 2 diabetes mellitus with diabetic chronic kidney disease: Secondary | ICD-10-CM

## 2017-12-09 DIAGNOSIS — R55 Syncope and collapse: Secondary | ICD-10-CM

## 2017-12-09 MED ORDER — LOSARTAN POTASSIUM 25 MG PO TABS: 25.0000 mg | ORAL_TABLET | Freq: Every day | ORAL | 3 refills | Status: DC

## 2017-12-09 NOTE — Progress Notes (Signed)
[facility] Summary Report / Loop Recorder 

## 2017-12-09 NOTE — Progress Notes (Signed)
Patient ID: Julie Brewer, female   DOB: [birth year], 67 y.o.   MRN: [id number]   Reason for Appointment: Follow-up of various problems  History of Present Illness   Diagnosis: Type 2 DIABETES MELITUS, date of diagnosis:  1985  Prior history: She has been on insulin since diagnosis and on Lantus previously Also at some point had been started on Glucophage several years ago also Because of insurance preference Lantus was changed to Levemir  She refuses to use analog rapid acting insulin because of cost and is using regular insulin for several years   Her blood sugars are generally well controlled and A1c usually under 7% Her A1c previously was higher with stopping metformin at 8%  Recent history:   Insulin regimen: Levemir insulin 16 Uin the morning daily.  Regular insulin 30 minutes Before eating, 10 units a.m. and 5 ac supper  Oral hypoglycemic drugs: Metformin ER 750 MG once daily    Her A1c in June was 6.1 and now 6.6  Fructosamine is previously 229  Current blood sugar patterns, management and problems:  As before able to check her blood sugar at home even with her family members being capable of doing so  Lab fasting glucose was near normal  No hypoglycemic symptoms reported  Her mealtime insulin was not changed on her last visit and she will adjust the morning dose based on whether she is eating cereal or not  Metformin has been continued at a low dose, previously had helped her level of control  Her niece is drawing up her insulin  Side effects from medications: None      Glucometer:  FreeStyle, no home readings available  Meals:  usually 2 meals per day at 10 AM and 5 PM.     Mealtime protein sources:turkey, chicken.  Eating cereal for breakfast at times Avoiding sweet drinks  Physical activity: exercise: Some walking within the house                Wt Readings from Last 3 Encounters:  12/09/17 148 lb (67.1 kg)  09/24/17 144 lb (65.3  kg)  09/18/17 141 lb (64 kg)   Lab Results  Component Value Date   HGBA1C 6.6 (H) 12/06/2017   HGBA1C 6.1 07/05/2017   HGBA1C 5.5 03/21/2017   Lab Results  Component Value Date   MICROALBUR 154.4 (H) 12/06/2017   LDLCALC 67 07/05/2017   CREATININE 1.71 (H) 12/06/2017      Lab Results  Component Value Date   FRUCTOSAMINE 229 09/13/2017    Other active problems: See review of systems    Lab on 12/06/2017  Component Date Value Ref Range Status  . Sodium 12/06/2017 136  135 - 145 mEq/L Final  . Potassium 12/06/2017 4.6  3.5 - 5.1 mEq/L Final  . Chloride 12/06/2017 106  96 - 112 mEq/L Final  . CO2 12/06/2017 24  19 - 32 mEq/L Final  . Glucose, Bld 12/06/2017 99  70 - 99 mg/dL Final  . BUN 12/06/2017 22  6 - 23 mg/dL Final  . Creatinine, Ser 12/06/2017 1.71* 0.40 - 1.20 mg/dL Final  . Total Bilirubin 12/06/2017 0.2  0.2 - 1.2 mg/dL Final  . Alkaline Phosphatase 12/06/2017 95  39 - 117 U/L Final  . AST 12/06/2017 13  0 - 37 U/L Final  . ALT 12/06/2017 11  0 - 35 U/L Final  . Total Protein 12/06/2017 6.7  6.0 - 8.3 g/dL Final  . Albumin 12/06/2017 3.6  3.5 - 5.2 g/dL Final  . Calcium 12/06/2017 9.2  8.4 - 10.5 mg/dL Final  . GFR 12/06/2017 38.51* >60.00 mL/min Final  . Hgb A1c MFr Bld 12/06/2017 6.6* 4.6 - 6.5 % Final   Glycemic Control Guidelines for People with Diabetes:Non Diabetic:  <6%Goal of Therapy: <7%Additional Action Suggested:  >8%   . Microalb, Ur 12/06/2017 154.4* 0.0 - 1.9 mg/dL Final  . Creatinine,U 12/06/2017 59.3  mg/dL Final  . Microalb Creat Ratio 12/06/2017 260.5* 0.0 - 30.0 mg/g Final    Allergies as of 12/09/2017      Reactions   Morphine And Related Other (See Comments)   Hallucenations    Penicillins Rash, Other (See Comments)   Swelling      Medication List        Accurate as of 12/09/17  2:35 PM. Always use your most recent med list.          amLODipine 10 MG tablet Commonly known as:  NORVASC Take 10 mg by mouth daily.   aspirin  325 MG EC tablet Take 325 mg by mouth 2 (two) times daily.   atorvastatin 20 MG tablet Commonly known as:  LIPITOR TAKE 1 TABLET BY MOUTH ONCE DAILY   cilostazol 100 MG tablet Commonly known as:  PLETAL TAKE 1 TABLET BY MOUTH TWICE DAILY   gabapentin 100 MG capsule Commonly known as:  NEURONTIN Take 1 capsule (100 mg total) by mouth 2 (two) times daily.   glucose blood test strip Use as instructed to check blood sugar 2 times a day dx code E11.9   indapamide 1.25 MG tablet Commonly known as:  LOZOL TAKE 1 TABLET BY MOUTH ONCE DAILY   insulin glargine 100 UNIT/ML injection Commonly known as:  LANTUS Inject 0.14 mLs (14 Units total) into the skin daily.   insulin regular 100 units/mL injection Commonly known as:  NOVOLIN R,HUMULIN R Inject 0.05-0.1 mLs (5-10 Units total) into the skin See admin instructions. 10 units in the morning and 5-7 units in the evening.   IRON PO Take 1 tablet by mouth daily.   losartan 25 MG tablet Commonly known as:  COZAAR Take 1 tablet (25 mg total) by mouth daily.   metFORMIN 750 MG 24 hr tablet Commonly known as:  GLUCOPHAGE-XR TAKE 1 TABLET BY MOUTH ONCE DAILY   omeprazole 20 MG capsule Commonly known as:  PRILOSEC TAKE 1 CAPSULE BY MOUTH ONCE DAILY (DUE  FOR  OFFICE  VISIT  THIS  MONTH)   Vitamin D (Ergocalciferol) 1.25 MG (50000 UT) Caps capsule Commonly known as:  DRISDOL TAKE ONE CAPSULE BY MOUTH EVERY 7 DAYS       Allergies:  Allergies  Allergen Reactions  . Morphine And Related Other (See Comments)    Hallucenations   . Penicillins Rash and Other (See Comments)    Swelling    Past Medical History:  Diagnosis Date  . Asthma    No probnlems recently  . 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 Diabetes mellitus    Type II per Dr [name]  (patient said type I)  . Fibroid   . GERD (gastroesophageal reflux disease)   . Glaucoma   . Hyperlipidemia   . Hypertension   .  Iron deficiency anemia 03/09/2016  . Peripheral vascular disease ([place])   . Pneumonia 2006  . Shortness of breath dyspnea    with exdrtion, "Walkling too fast"  . Stroke (  [place])    no residual    Past Surgical History:  Procedure Laterality Date  . ABDOMINAL HYSTERECTOMY    . BIOPSY  06/10/2017   Procedure: BIOPSY;  Surgeon: Ronnette Juniper, MD;  Location: [place];  Service: Gastroenterology;;  . CERVICAL FUSION     with graft from hip  . COLONOSCOPY  July 09, 2012  . DIRECT LARYNGOSCOPY N/A 06/07/2014   Procedure: DIRECT LARYNGOSCOPY with BIOPSY and EXCISION VOLLECULAR CYST;  Surgeon: Ruby Cola, MD;  Location: [facility] OR;  Service: ENT;  Laterality: N/A;  . ESOPHAGOGASTRODUODENOSCOPY (EGD) WITH PROPOFOL Left 06/10/2017   Procedure: ESOPHAGOGASTRODUODENOSCOPY (EGD) WITH PROPOFOL;  Surgeon: Ronnette Juniper, MD;  Location: [place];  Service: Gastroenterology;  Laterality: Left;  . FLEXIBLE SIGMOIDOSCOPY Left 06/10/2017   Procedure: FLEXIBLE SIGMOIDOSCOPY;  Surgeon: Ronnette Juniper, MD;  Location: [place];  Service: Gastroenterology;  Laterality: Left;  . LOOP RECORDER INSERTION N/A 06/20/2016   Procedure: Loop Recorder Insertion;  Surgeon: [name], MD;  Location: [place] [facility];  Service: Cardiovascular;  Laterality: N/A;  . REFRACTIVE SURGERY Bilateral    both eyes  . SPINE SURGERY     lumbar    Family History  Problem Relation Age of Onset  . Cancer Mother   . Heart disease Mother   . Diabetes Father   . Cancer Brother   . Cancer Brother   . Throat cancer Brother     Social History:  reports that she has been smoking cigarettes. She has a 30.00 pack-year smoking history. She has never used smokeless tobacco. She reports that she drinks alcohol. She reports that she does not use drugs.  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    HYPERTENSION:  Treated with amlodipine 10 mg, indapamide 1.25   Previously lisinopril was stopped because of mild hyperkalemia and worsening renal function  Although she has been seen by nephrologist in September no records are available and no recommendations have been made Blood pressure was relatively higher on first measurement today   BP Readings from Last 3 Encounters:  12/09/17 (!) 140/50  09/24/17 (!) 145/71  09/18/17 (!) 142/68     Renal function: Her creatinine is about the same, although was better in June, no recommendations apparently made by nephrologist   Lab Results  Component Value Date   CREATININE 1.71 (H) 12/06/2017   CREATININE 1.67 (H) 09/13/2017   CREATININE 1.39 (H) 07/17/2017    Electrolyte abnormalities: Resolved  Lab Results  Component Value Date   NA 136 12/06/2017   K 4.6 12/06/2017   CL 106 12/06/2017   CO2 24 12/06/2017     HYPERLIPIDEMIA: The lipid abnormality consists of elevated LDL controlled with Lipitor 20 mg .   Her baseline LDL was 202 Her last LDL is as follows:  Lab Results  Component Value Date   CHOL 150 07/05/2017   HDL 52.90 07/05/2017   LDLCALC 67 07/05/2017   TRIG 155.0 (H) 07/05/2017   CHOLHDL 3 07/05/2017    Vitamin D: Taking 50,000 unit dosage once a month This is monitored by PCP    Lab Results  Component Value Date   VD25OH 40.22 09/13/2016       Last diabetic foot exam was done in 6/19  Takes Gabapentin regularly twice daily with control of her leg pains    Examination:   BP (!) 140/50 (Patient Position: Sitting)   Pulse Marland Kitchen)  101   Ht 5\' 3"  (1.6 m)   Wt 148 lb (67.1 kg)   SpO2 97%   BMI 26.22 kg/m   Body mass index is 26.22 kg/m.   No pedal edema  ASSESSMENT/ PLAN:   HYPERTENSION:   Her blood pressure is fairly good Now taking Norvasc and every other day indapamide  CKD: Stable compared to August but has increased microalbumin now Normal potassium now at 4.6  Recommendations:  Trial of 25 mg losartan because of her proteinuria  We will  recheck potassium in 3 weeks  No change in amlodipine but consider using diltiazem instead  She will be following up with nephrologist in about 3 months  Diabetes type 2 on insulin  A1c is lower than expected again Since there are no blood sugars available at home would not make any changes insulin Fructosamine to be checked on the next visit   HYPONATREMIA: Her sodium is not  with taking indapamide every other day instead of daily    There are no Patient Instructions on file for this visit.  Elayne Snare 12/09/2017, 2:35 PM     Note: This office note was prepared with Dragon voice recognition system technology. Any transcriptional errors that result from this process are unintentional.

## 2017-12-17 ENCOUNTER — Other Ambulatory Visit: Payer: Self-pay | Admitting: Endocrinology

## 2017-12-17 ENCOUNTER — Encounter: Payer: Self-pay | Admitting: Cardiovascular Disease

## 2017-12-17 ENCOUNTER — Ambulatory Visit (INDEPENDENT_AMBULATORY_CARE_PROVIDER_SITE_OTHER): Payer: Medicare Other | Admitting: Cardiovascular Disease

## 2017-12-17 DIAGNOSIS — I10 Essential (primary) hypertension: Secondary | ICD-10-CM | POA: Diagnosis not present

## 2017-12-17 DIAGNOSIS — I779 Disorder of arteries and arterioles, unspecified: Secondary | ICD-10-CM

## 2017-12-17 DIAGNOSIS — R55 Syncope and collapse: Secondary | ICD-10-CM

## 2017-12-17 DIAGNOSIS — E785 Hyperlipidemia, unspecified: Secondary | ICD-10-CM | POA: Diagnosis not present

## 2017-12-17 NOTE — Patient Instructions (Signed)
Medication Instructions:  Your physician recommends that you continue on your current medications as directed. Please refer to the Current Medication list given to you today.  If you need a refill on your cardiac medications before your next appointment, please call your pharmacy.   Lab work: none If you have labs (blood work) drawn today and your tests are completely normal, you will receive your results only by: Marland Kitchen MyChart Message (if you have MyChart) OR . A paper copy in the mail If you have any lab test that is abnormal or we need to change your treatment, we will call you to review the results.  Testing/Procedures: none  Follow-Up: At [facility], you and your health needs are our priority.  As part of our continuing mission to provide you with exceptional heart care, we have created designated Provider Care Teams.  These Care Teams include your primary Cardiologist (physician) and Advanced Practice Providers (APPs -  Physician Assistants and Nurse Practitioners) who all work together to provide you with the care you need, when you need it. You will need a follow up appointment in 12 months.  Please call our office 2 months in advance to schedule this appointment.  You may see Quay Burow, MD or one of the following Advanced Practice Providers on your designated Care Team:   Kerin Ransom, PA-C Roby Lofts, [place] . Sande Rives, PA-C  Any Other Special Instructions Will Be Listed Below (If Applicable).

## 2017-12-17 NOTE — Progress Notes (Signed)
12/17/2017 Julie Brewer   1950/02/22  [id number]  Primary Physician [name], MD Primary Cardiologist: Lorretta Harp MD [name], [place]  HPI:  Julie Brewer is a 67 y.o.  moderately overweight separated African-American female mother of one child, grandmother 37 grandchildren referred by her PCP, [name], for evaluation of a recent episode of syncope.She is accompanied by her daughter Julie Brewer today.  I last saw her in the office 12/14/2016. Rk factors include 50-pack-years of tobacco abuse continued to smoke one pack per day. She has history of hypertension, diabetes and hyperlipidemia. She has never had a heart attack but does have a history of "TIAs. She denies chest pain but does get short of breath. She has peripheral arterial disease and is followed by Dr. Trula Slade for claudication which he is minimally symptomatic from. Approximately one week ago she was witnessed to have lost consciousness while sitting in a chair for 45 minutes. There was no loss of bowel or bladder control or tonic clonic activity. We do not know her serum glucose level at that time. She did have some nausea and vomiting after she woke up.She was seen by Kerin Ransom on 4/60/18 which was several weeks after a recurrent episode of syncope and also describes an episode of syncope several weeks ago after eating dinner. Her husband was there. She was noted to be diaphoretic. There are no other symptoms. A 2-D echo was performed 05/15/16 which was entirely normal. She had a loop recorder implanted by Dr. Sallyanne Kuster 06/20/16. She's had no further episodes of syncope since that time. Since I saw her a year ago she is remained stable.  She denies chest pain, shortness of breath or further episodes of syncope.  There have been no arrhythmias demonstrated on her loop recorder.  Current Meds  Medication Sig  . amLODipine (NORVASC) 10 MG tablet Take 10 mg by mouth daily.   Marland Kitchen aspirin 325 MG EC tablet  Take 325 mg by mouth 2 (two) times daily.   Marland Kitchen atorvastatin (LIPITOR) 20 MG tablet TAKE 1 TABLET BY MOUTH ONCE DAILY  . cilostazol (PLETAL) 100 MG tablet TAKE 1 TABLET BY MOUTH TWICE DAILY  . gabapentin (NEURONTIN) 100 MG capsule Take 1 capsule (100 mg total) by mouth 2 (two) times daily.  Marland Kitchen glucose blood (FREESTYLE LITE) test strip Use as instructed to check blood sugar 2 times a day dx code E11.9  . indapamide (LOZOL) 1.25 MG tablet TAKE 1 TABLET BY MOUTH ONCE DAILY  . insulin glargine (LANTUS) 100 UNIT/ML injection Inject 0.14 mLs (14 Units total) into the skin daily.  . insulin regular (NOVOLIN R,HUMULIN R) 100 units/mL injection Inject 0.05-0.1 mLs (5-10 Units total) into the skin See admin instructions. 10 units in the morning and 5-7 units in the evening.  . IRON PO Take 1 tablet by mouth daily.   Marland Kitchen losartan (COZAAR) 25 MG tablet Take 1 tablet (25 mg total) by mouth daily.  . metFORMIN (GLUCOPHAGE-XR) 750 MG 24 hr tablet TAKE 1 TABLET BY MOUTH ONCE DAILY  . omeprazole (PRILOSEC) 20 MG capsule TAKE 1 CAPSULE BY MOUTH ONCE DAILY (DUE  FOR  OFFICE  VISIT  THIS  MONTH)  . Vitamin D, Ergocalciferol, (DRISDOL) 50000 units CAPS capsule TAKE ONE CAPSULE BY MOUTH EVERY 7 DAYS     Allergies  Allergen Reactions  . Morphine And Related Other (See Comments)    Hallucenations   . Penicillins Rash and Other (See Comments)  Swelling    Social History   Socioeconomic History  . Marital status: Legally Separated    Spouse name: Not on file  . Number of children: Not on file  . Years of education: Not on file  . Highest education level: Not on file  Occupational History  . Not on file  Social Needs  . Financial resource strain: Not on file  . Food insecurity:    Worry: Not on file    Inability: Not on file  . Transportation needs:    Medical: Not on file    Non-medical: Not on file  Tobacco Use  . Smoking status: Current Every Day Smoker    Packs/day: 1.00    Years: 30.00    Pack  years: 30.00    Types: Cigarettes  . Smokeless tobacco: Never Used  Substance and Sexual Activity  . Alcohol use: Yes    Comment: socially  . Drug use: No  . Sexual activity: Never    Birth control/protection: Post-menopausal, Surgical    Comment: Hysterectomy  Lifestyle  . Physical activity:    Days per week: Not on file    Minutes per session: Not on file  . Stress: Not on file  Relationships  . Social connections:    Talks on phone: Not on file    Gets together: Not on file    Attends religious service: Not on file    Active member of club or organization: Not on file    Attends meetings of clubs or organizations: Not on file    Relationship status: Not on file  . Intimate partner violence:    Fear of current or ex partner: Not on file    Emotionally abused: Not on file    Physically abused: Not on file    Forced sexual activity: Not on file  Other Topics Concern  . Not on file  Social History Narrative  . Not on file     Review of Systems: General: negative for chills, fever, night sweats or weight changes.  Cardiovascular: negative for chest pain, dyspnea on exertion, edema, orthopnea, palpitations, paroxysmal nocturnal dyspnea or shortness of breath Dermatological: negative for rash Respiratory: negative for cough or wheezing Urologic: negative for hematuria Abdominal: negative for nausea, vomiting, diarrhea, bright red blood per rectum, melena, or hematemesis Neurologic: negative for visual changes, syncope, or dizziness All other systems reviewed and are otherwise negative except as noted above.    Blood pressure (!) 134/52, pulse (!) 107, height 5\' 3"  (1.6 m), weight 148 lb (67.1 kg), SpO2 97 %.  General appearance: alert and no distress Neck: no adenopathy, no carotid bruit, no JVD, supple, symmetrical, trachea midline and thyroid not enlarged, symmetric, no tenderness/mass/nodules Lungs: clear to auscultation bilaterally Heart: regular rate and rhythm, S1,  S2 normal, no murmur, click, rub or gallop Extremities: extremities normal, atraumatic, no cyanosis or edema Pulses: 2+ and symmetric Skin: Skin color, texture, turgor normal. No rashes or lesions Neurologic: Alert and oriented X 3, normal strength and tone. Normal symmetric reflexes. Normal coordination and gait  EKG not performed today  ASSESSMENT AND PLAN:   Dyslipidemia History of dyslipidemia on statin therapy with lipid profile performed 07/05/2017 revealing total cholesterol 150, LDL 67 and HDL of 52.  Essential hypertension History of essential hypertension her blood pressure measured at 134/52.  She is on amlodipine and losartan.  Continue current meds at current dosing.  Syncope and collapse History of syncope with normal 2D echo and loop recorder implantation  revealing no arrhythmias.  She said no syncopal episode since I saw her one year ago.      Lorretta Harp MD FACP,FACC,FAHA, [facility] 12/17/2017 3:08 PM

## 2017-12-17 NOTE — Assessment & Plan Note (Signed)
History of essential hypertension her blood pressure measured at 134/52.  She is on amlodipine and losartan.  Continue current meds at current dosing.

## 2017-12-17 NOTE — Assessment & Plan Note (Signed)
History of dyslipidemia on statin therapy with lipid profile performed 07/05/2017 revealing total cholesterol 150, LDL 67 and HDL of 52.

## 2017-12-17 NOTE — Assessment & Plan Note (Signed)
History of syncope with normal 2D echo and loop recorder implantation revealing no arrhythmias.  She said no syncopal episode since I saw her one year ago.

## 2018-01-01 ENCOUNTER — Other Ambulatory Visit (INDEPENDENT_AMBULATORY_CARE_PROVIDER_SITE_OTHER): Payer: Medicare Other

## 2018-01-01 DIAGNOSIS — E1122 Type 2 diabetes mellitus with diabetic chronic kidney disease: Secondary | ICD-10-CM | POA: Diagnosis not present

## 2018-01-01 DIAGNOSIS — N183 Chronic kidney disease, stage 3 (moderate): Secondary | ICD-10-CM | POA: Diagnosis not present

## 2018-01-01 LAB — RENAL FUNCTION PANEL
ALBUMIN: 3.7 g/dL (ref 3.5–5.2)
BUN: 21 mg/dL (ref 6–23)
CHLORIDE: 109 meq/L (ref 96–112)
CO2: 22 meq/L (ref 19–32)
Calcium: 9.2 mg/dL (ref 8.4–10.5)
Creatinine, Ser: 1.65 mg/dL — ABNORMAL HIGH (ref 0.40–1.20)
GFR: 40.12 mL/min — ABNORMAL LOW (ref >60.00)
Glucose, Bld: 124 mg/dL — ABNORMAL HIGH (ref 70–99)
POTASSIUM: 4.6 meq/L (ref 3.5–5.1)
Phosphorus: 3.9 mg/dL (ref 2.3–4.6)
SODIUM: 137 meq/L (ref 135–145)

## 2018-01-01 LAB — BASIC METABOLIC PANEL
BUN: 21 mg/dL (ref 6–23)
CO2: 22 meq/L (ref 19–32)
Calcium: 9.2 mg/dL (ref 8.4–10.5)
Chloride: 109 mEq/L (ref 96–112)
Creatinine, Ser: 1.65 mg/dL — ABNORMAL HIGH (ref 0.40–1.20)
GFR: 40.12 mL/min — ABNORMAL LOW (ref >60.00)
Glucose, Bld: 124 mg/dL — ABNORMAL HIGH (ref 70–99)
POTASSIUM: 4.6 meq/L (ref 3.5–5.1)
Sodium: 137 mEq/L (ref 135–145)

## 2018-01-02 NOTE — Progress Notes (Signed)
Please call to let patient know that the potassium results are normal so she can continue losartan that was started

## 2018-01-09 ENCOUNTER — Ambulatory Visit (INDEPENDENT_AMBULATORY_CARE_PROVIDER_SITE_OTHER): Payer: Medicare Other

## 2018-01-09 DIAGNOSIS — R55 Syncope and collapse: Secondary | ICD-10-CM

## 2018-01-10 NOTE — Progress Notes (Signed)
[facility] Summary Report / Loop Recorder 

## 2018-01-15 ENCOUNTER — Encounter: Payer: Self-pay | Admitting: Podiatry

## 2018-01-15 ENCOUNTER — Ambulatory Visit (INDEPENDENT_AMBULATORY_CARE_PROVIDER_SITE_OTHER): Payer: Medicare Other | Admitting: Podiatry

## 2018-01-15 DIAGNOSIS — B351 Tinea unguium: Secondary | ICD-10-CM | POA: Diagnosis not present

## 2018-01-15 DIAGNOSIS — Q828 Other specified congenital malformations of skin: Secondary | ICD-10-CM | POA: Diagnosis not present

## 2018-01-15 DIAGNOSIS — E1151 Type 2 diabetes mellitus with diabetic peripheral angiopathy without gangrene: Secondary | ICD-10-CM | POA: Diagnosis not present

## 2018-01-15 NOTE — Progress Notes (Signed)
Complaint:  Visit Type: Patient returns to my office for continued preventative foot care services. Complaint: Patient states" my nails have grown long and thick and become painful to walk and wear shoes" Patient has been diagnosed with DM with angiopathy.. The patient presents for preventative foot care services. No changes to ROS.  Patient is blind and taking pletal.  Podiatric Exam: Vascular: dorsalis pedis and posterior tibial pulses are weakly palpable bilateral. Capillary return is immediate. Temperature gradient is WNL. Skin turgor WNL  Sensorium: Normal Semmes Weinstein monofilament test. Normal tactile sensation bilaterally. Nail Exam: Pt has thick disfigured discolored nails with subungual debris noted bilateral entire nail hallux through fifth toenails Ulcer Exam: There is no evidence of ulcer or pre-ulcerative changes or infection. Orthopedic Exam: Muscle tone and strength are WNL. No limitations in general ROM. No crepitus or effusions noted. Foot type and digits show no abnormalities. Bony prominences are unremarkable. Skin:  Porokeratosis sub 2 right.. No infection or ulcers  Diagnosis:  Onychomycosis, , Pain in right toe, pain in left toes  Debride porokeratosis right foot  Treatment & Plan Procedures and Treatment: Consent by patient was obtained for treatment procedures.   Debridement of mycotic and hypertrophic toenails, 1 through 5 bilateral and clearing of subungual debris. No ulceration, no infection noted.  Return Visit-Office Procedure: Patient instructed to return to the office for a follow up visit 3 months for continued evaluation and treatment.    Gardiner Barefoot DPM

## 2018-01-25 LAB — CUP PACEART REMOTE DEVICE CHECK
Implantable Pulse Generator Implant Date: 20180523
MDC IDC SESS DTM: 20191110010945

## 2018-01-28 ENCOUNTER — Other Ambulatory Visit: Payer: Self-pay | Admitting: Endocrinology

## 2018-01-30 ENCOUNTER — Other Ambulatory Visit: Payer: Self-pay | Admitting: Endocrinology

## 2018-02-11 ENCOUNTER — Ambulatory Visit (INDEPENDENT_AMBULATORY_CARE_PROVIDER_SITE_OTHER): Payer: Medicare Other

## 2018-02-11 DIAGNOSIS — R55 Syncope and collapse: Secondary | ICD-10-CM | POA: Diagnosis not present

## 2018-02-12 NOTE — Progress Notes (Signed)
[facility] Summary Report / Loop Recorder 

## 2018-02-14 LAB — CUP PACEART REMOTE DEVICE CHECK
Date Time Interrogation Session: 20200115013939
MDC IDC PG IMPLANT DT: 20180523

## 2018-02-17 ENCOUNTER — Telehealth: Payer: Self-pay

## 2018-02-17 NOTE — Telephone Encounter (Signed)
Spoke with pt regarding her episode of SVT on 1/19 @ 0747. Pt states she was asymptomatic and sleeping at the time. Pt states she is blind and unable to call us when she has symptoms. A notation will be made in [facility] to continue calling pt with alerts. Pt verbalized understanding and had no additional questions.

## 2018-02-18 NOTE — Telephone Encounter (Signed)
ECG routed to Dr. Sallyanne Kuster for review (implanted for syncope).

## 2018-02-19 NOTE — Telephone Encounter (Signed)
Brief SVT, not likely to explain syncope. Continue to monitor

## 2018-02-20 LAB — CUP PACEART REMOTE DEVICE CHECK
Date Time Interrogation Session: 20191213011001
MDC IDC PG IMPLANT DT: 20180523

## 2018-03-06 ENCOUNTER — Other Ambulatory Visit: Payer: Self-pay | Admitting: Endocrinology

## 2018-03-06 DIAGNOSIS — E1142 Type 2 diabetes mellitus with diabetic polyneuropathy: Secondary | ICD-10-CM

## 2018-03-06 DIAGNOSIS — E119 Type 2 diabetes mellitus without complications: Secondary | ICD-10-CM

## 2018-03-06 DIAGNOSIS — Z794 Long term (current) use of insulin: Secondary | ICD-10-CM

## 2018-03-10 DIAGNOSIS — Z720 Tobacco use: Secondary | ICD-10-CM | POA: Diagnosis not present

## 2018-03-10 DIAGNOSIS — E1122 Type 2 diabetes mellitus with diabetic chronic kidney disease: Secondary | ICD-10-CM | POA: Diagnosis not present

## 2018-03-10 DIAGNOSIS — N183 Chronic kidney disease, stage 3 (moderate): Secondary | ICD-10-CM | POA: Diagnosis not present

## 2018-03-10 DIAGNOSIS — I129 Hypertensive chronic kidney disease with stage 1 through stage 4 chronic kidney disease, or unspecified chronic kidney disease: Secondary | ICD-10-CM | POA: Diagnosis not present

## 2018-03-11 ENCOUNTER — Other Ambulatory Visit (INDEPENDENT_AMBULATORY_CARE_PROVIDER_SITE_OTHER): Payer: Medicare Other

## 2018-03-11 DIAGNOSIS — Z794 Long term (current) use of insulin: Secondary | ICD-10-CM | POA: Diagnosis not present

## 2018-03-11 DIAGNOSIS — E119 Type 2 diabetes mellitus without complications: Secondary | ICD-10-CM | POA: Diagnosis not present

## 2018-03-11 DIAGNOSIS — E1142 Type 2 diabetes mellitus with diabetic polyneuropathy: Secondary | ICD-10-CM | POA: Diagnosis not present

## 2018-03-11 LAB — COMPREHENSIVE METABOLIC PANEL
ALT: 11 U/L (ref 0–35)
AST: 12 U/L (ref 0–37)
Albumin: 3.5 g/dL (ref 3.5–5.2)
Alkaline Phosphatase: 108 U/L (ref 39–117)
BUN: 20 mg/dL (ref 6–23)
CALCIUM: 8.8 mg/dL (ref 8.4–10.5)
CO2: 23 mEq/L (ref 19–32)
CREATININE: 1.82 mg/dL — AB (ref 0.40–1.20)
Chloride: 106 mEq/L (ref 96–112)
GFR: 33.69 mL/min — ABNORMAL LOW (ref >60.00)
Glucose, Bld: 111 mg/dL — ABNORMAL HIGH (ref 70–99)
Potassium: 4.4 mEq/L (ref 3.5–5.1)
Sodium: 137 mEq/L (ref 135–145)
Total Bilirubin: 0.2 mg/dL (ref 0.2–1.2)
Total Protein: 6.6 g/dL (ref 6.0–8.3)

## 2018-03-11 LAB — URINALYSIS, ROUTINE W REFLEX MICROSCOPIC
Bilirubin Urine: NEGATIVE
Hgb urine dipstick: NEGATIVE
Ketones, ur: NEGATIVE
Leukocytes,Ua: NEGATIVE
Nitrite: NEGATIVE
SPECIFIC GRAVITY, URINE: 1.01 (ref 1.000–1.030)
Total Protein, Urine: 100 — AB
URINE GLUCOSE: NEGATIVE
Urobilinogen, UA: 0.2 (ref 0.0–1.0)
pH: 6.5 (ref 5.0–8.0)

## 2018-03-11 LAB — HEMOGLOBIN A1C: Hgb A1c MFr Bld: 7.7 % — ABNORMAL HIGH (ref 4.6–6.5)

## 2018-03-11 LAB — MICROALBUMIN / CREATININE URINE RATIO
Creatinine,U: 44.6 mg/dL
MICROALB/CREAT RATIO: 440.7 mg/g — AB (ref 0.0–30.0)
Microalb, Ur: 196.7 mg/dL — ABNORMAL HIGH (ref 0.0–1.9)

## 2018-03-12 LAB — FRUCTOSAMINE: FRUCTOSAMINE: 218 umol/L (ref 0–285)

## 2018-03-14 ENCOUNTER — Ambulatory Visit (INDEPENDENT_AMBULATORY_CARE_PROVIDER_SITE_OTHER): Payer: Medicare Other | Admitting: Endocrinology

## 2018-03-14 ENCOUNTER — Encounter: Payer: Self-pay | Admitting: Endocrinology

## 2018-03-14 VITALS — BP 152/72 | HR 92 | Ht 63.0 in | Wt 150.8 lb

## 2018-03-14 DIAGNOSIS — E1165 Type 2 diabetes mellitus with hyperglycemia: Secondary | ICD-10-CM

## 2018-03-14 DIAGNOSIS — Z794 Long term (current) use of insulin: Secondary | ICD-10-CM

## 2018-03-14 DIAGNOSIS — N183 Chronic kidney disease, stage 3 (moderate): Secondary | ICD-10-CM | POA: Diagnosis not present

## 2018-03-14 DIAGNOSIS — E1142 Type 2 diabetes mellitus with diabetic polyneuropathy: Secondary | ICD-10-CM

## 2018-03-14 DIAGNOSIS — E1122 Type 2 diabetes mellitus with diabetic chronic kidney disease: Secondary | ICD-10-CM | POA: Diagnosis not present

## 2018-03-14 DIAGNOSIS — E1121 Type 2 diabetes mellitus with diabetic nephropathy: Secondary | ICD-10-CM

## 2018-03-14 DIAGNOSIS — I10 Essential (primary) hypertension: Secondary | ICD-10-CM

## 2018-03-14 LAB — GLUCOSE, POCT (MANUAL RESULT ENTRY): POC Glucose: 200 mg/dl — AB (ref 70–99)

## 2018-03-14 MED ORDER — INDAPAMIDE 1.25 MG PO TABS: 1.2500 mg | ORAL_TABLET | Freq: Every day | ORAL | 3 refills | Status: DC

## 2018-03-14 MED ORDER — GLUCOSE BLOOD VI STRP: ORAL_STRIP | 1 refills | Status: DC

## 2018-03-14 MED ORDER — DILTIAZEM HCL ER COATED BEADS 360 MG PO CP24: 360.0000 mg | ORAL_CAPSULE | Freq: Every day | ORAL | 3 refills | Status: DC

## 2018-03-14 NOTE — Progress Notes (Signed)
Patient ID: Julie Brewer, female   DOB: 1950-07-29, 68 y.o.   MRN: [id number]   Reason for Appointment: Follow-up of various problems  History of Present Illness   Diagnosis: Type 2 DIABETES MELITUS, date of diagnosis:  1985  Prior history: She has been on insulin since diagnosis and on Lantus previously Also at some point had been started on Glucophage several years ago also Because of insurance preference Lantus was changed to Levemir  She refuses to use analog rapid acting insulin because of cost and is using regular insulin for several years   Her blood sugars are generally well controlled and A1c usually under 7% Her A1c previously was higher with stopping metformin at 8%  Recent history:   Insulin regimen: Lantus insulin 16 U in the morning daily.  Regular insulin 30 minutes Before eating, 10 units a.m. and 5 ac supper  Oral hypoglycemic drugs: Metformin ER 750 MG once daily    Her A1c in June was 6.1 and now 7.7  Fructosamine is previously 229  Current blood sugar patterns, management and problems:  She is now taking Lantus instead of Levemir  Although her fasting blood sugar was 111 today in the office her blood sugar is 200 about 2 hours after breakfast  She only eats raisin bran cereal in the morning because she does not have any help with cooking in the morning  As before her family is not helping with blood sugar monitoring and only with insulin doses  No hypoglycemia reported  Currently her weight is only about 2 pounds different  She thinks her blood sugars may have been higher over November and December from not watching her diet  Side effects from medications: None      Glucometer:  FreeStyle, no home readings available  Meals:  usually 2 meals per day at 10 AM and 5 PM.     Mealtime protein sources:turkey, chicken.  Eating cereal for breakfast daily Avoiding sweet drinks  Physical activity: exercise: Some walking within the house               Wt Readings from Last 3 Encounters:  03/14/18 150 lb 12.8 oz (68.4 kg)  12/17/17 148 lb (67.1 kg)  12/09/17 148 lb (67.1 kg)   Lab Results  Component Value Date   HGBA1C 7.7 (H) 03/11/2018   HGBA1C 6.6 (H) 12/06/2017   HGBA1C 6.1 07/05/2017   Lab Results  Component Value Date   MICROALBUR 196.7 (H) 03/11/2018   LDLCALC 67 07/05/2017   CREATININE 1.82 (H) 03/11/2018      Lab Results  Component Value Date   FRUCTOSAMINE 218 03/11/2018   FRUCTOSAMINE 229 09/13/2017    Other active problems: See review of systems    Office Visit on 03/14/2018  Component Date Value Ref Range Status  . POC Glucose 03/14/2018 200* 70 - 99 mg/dl Final  Lab on 03/11/2018  Component Date Value Ref Range Status  . Fructosamine 03/11/2018 218  0 - 285 umol/L Final   Comment: Published reference interval for apparently healthy subjects between age 4 and 37 is 26 - 285 umol/L and in a poorly controlled diabetic population is 228 - 563 umol/L with a mean of 396 umol/L.   Marland Kitchen Microalb, Ur 03/11/2018 196.7* 0.0 - 1.9 mg/dL Final  . Creatinine,U 03/11/2018 44.6  mg/dL Final  . Microalb Creat Ratio 03/11/2018 440.7* 0.0 - 30.0 mg/g Final  . Color, Urine 03/11/2018 YELLOW  Yellow;Lt. Yellow;Straw;Dark Yellow;Amber;Green;Red;Brown Final  .  APPearance 03/11/2018 CLEAR  Clear;Turbid;Slightly Cloudy;Cloudy Final  . Specific Gravity, Urine 03/11/2018 1.010  1.000 - 1.030 Final  . pH 03/11/2018 6.5  5.0 - 8.0 Final  . Total Protein, Urine 03/11/2018 100* Negative Final  . Urine Glucose 03/11/2018 NEGATIVE  Negative Final  . Ketones, ur 03/11/2018 NEGATIVE  Negative Final  . Bilirubin Urine 03/11/2018 NEGATIVE  Negative Final  . Hgb urine dipstick 03/11/2018 NEGATIVE  Negative Final  . Urobilinogen, UA 03/11/2018 0.2  0.0 - 1.0 Final  . Leukocytes,Ua 03/11/2018 NEGATIVE  Negative Final  . Nitrite 03/11/2018 NEGATIVE  Negative Final  . WBC, UA 03/11/2018 0-2/hpf  0-2/hpf Final  . RBC / HPF  03/11/2018 0-2/hpf  0-2/hpf Final  . Squamous Epithelial / LPF 03/11/2018 Rare(0-4/hpf)  Rare(0-4/hpf) Final  . Sodium 03/11/2018 137  135 - 145 mEq/L Final  . Potassium 03/11/2018 4.4  3.5 - 5.1 mEq/L Final  . Chloride 03/11/2018 106  96 - 112 mEq/L Final  . CO2 03/11/2018 23  19 - 32 mEq/L Final  . Glucose, Bld 03/11/2018 111* 70 - 99 mg/dL Final  . BUN 03/11/2018 20  6 - 23 mg/dL Final  . Creatinine, Ser 03/11/2018 1.82* 0.40 - 1.20 mg/dL Final  . Total Bilirubin 03/11/2018 0.2  0.2 - 1.2 mg/dL Final  . Alkaline Phosphatase 03/11/2018 108  39 - 117 U/L Final  . AST 03/11/2018 12  0 - 37 U/L Final  . ALT 03/11/2018 11  0 - 35 U/L Final  . Total Protein 03/11/2018 6.6  6.0 - 8.3 g/dL Final  . Albumin 03/11/2018 3.5  3.5 - 5.2 g/dL Final  . Calcium 03/11/2018 8.8  8.4 - 10.5 mg/dL Final  . GFR 03/11/2018 33.69* >60.00 mL/min Final  . Hgb A1c MFr Bld 03/11/2018 7.7* 4.6 - 6.5 % Final   Glycemic Control Guidelines for People with Diabetes:Non Diabetic:  <6%Goal of Therapy: <7%Additional Action Suggested:  >8%     Allergies as of 03/14/2018      Reactions   Morphine And Related Other (See Comments)   Hallucenations    Penicillins Rash, Other (See Comments)   Swelling      Medication List       Accurate as of March 14, 2018  3:43 PM. Always use your most recent med list.        amLODipine 10 MG tablet Commonly known as:  NORVASC Take 10 mg by mouth daily.   aspirin 325 MG EC tablet Take 325 mg by mouth 2 (two) times daily.   atorvastatin 20 MG tablet Commonly known as:  LIPITOR TAKE 1 TABLET BY MOUTH ONCE DAILY   cilostazol 100 MG tablet Commonly known as:  PLETAL TAKE 1 TABLET BY MOUTH TWICE DAILY   diltiazem 360 MG 24 hr capsule Commonly known as:  CARDIZEM CD Take 1 capsule (360 mg total) by mouth daily.   gabapentin 100 MG capsule Commonly known as:  NEURONTIN TAKE 1 CAPSULE BY MOUTH TWICE DAILY   glucose blood test strip Commonly known as:  FREESTYLE  LITE Use as instructed to check blood sugar 2 times a day dx code E11.9   indapamide 1.25 MG tablet Commonly known as:  LOZOL TAKE 1 TABLET BY MOUTH ONCE DAILY   insulin glargine 100 UNIT/ML injection Commonly known as:  LANTUS Inject 0.14 mLs (14 Units total) into the skin daily.   IRON PO Take 1 tablet by mouth daily.   losartan 25 MG tablet Commonly known as:  COZAAR Take 1  tablet (25 mg total) by mouth daily.   metFORMIN 750 MG 24 hr tablet Commonly known as:  GLUCOPHAGE-XR TAKE 1 TABLET BY MOUTH ONCE DAILY   NOVOLIN R RELION 100 units/mL injection Generic drug:  insulin regular Inject into the skin 2 (two) times daily before a meal. INJECT 10 UNITS UNDER THE SKIN AT BREAKFAST, AND 5 UNITS IN THE EVENING.   omeprazole 20 MG capsule Commonly known as:  PRILOSEC TAKE 1 CAPSULE BY MOUTH ONCE DAILY (DUE  FOR  OFFICE  VISIT  THIS  MONTH)   Vitamin D (Ergocalciferol) 1.25 MG (50000 UT) Caps capsule Commonly known as:  DRISDOL TAKE ONE CAPSULE BY MOUTH EVERY 7 DAYS       Allergies:  Allergies  Allergen Reactions  . Morphine And Related Other (See Comments)    Hallucenations   . Penicillins Rash and Other (See Comments)    Swelling    Past Medical History:  Diagnosis Date  . Asthma    No probnlems recently  . 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 Diabetes mellitus    Type II per Dr [name]  (patient said type I)  . Fibroid   . GERD (gastroesophageal reflux disease)   . Glaucoma   . Hyperlipidemia   . Hypertension   . Iron deficiency anemia 03/09/2016  . Peripheral vascular disease ([place])   . Pneumonia 2006  . Shortness of breath dyspnea    with exdrtion, "Walkling too fast"  . Stroke [facility])    no residual    Past Surgical History:  Procedure Laterality Date  . ABDOMINAL HYSTERECTOMY    . BIOPSY  06/10/2017   Procedure: BIOPSY;  Surgeon: Ronnette Juniper, MD;  Location: [place];  Service: Gastroenterology;;   . CERVICAL FUSION     with graft from hip  . COLONOSCOPY  July 09, 2012  . DIRECT LARYNGOSCOPY N/A 06/07/2014   Procedure: DIRECT LARYNGOSCOPY with BIOPSY and EXCISION VOLLECULAR CYST;  Surgeon: Ruby Cola, MD;  [facility];  Service: ENT;  Laterality: N/A;  . ESOPHAGOGASTRODUODENOSCOPY (EGD) WITH PROPOFOL Left 06/10/2017   Procedure: ESOPHAGOGASTRODUODENOSCOPY (EGD) WITH PROPOFOL;  Surgeon: Ronnette Juniper, MD;  Location: [place];  Service: Gastroenterology;  Laterality: Left;  . FLEXIBLE SIGMOIDOSCOPY Left 06/10/2017   Procedure: FLEXIBLE SIGMOIDOSCOPY;  Surgeon: Ronnette Juniper, MD;  Location: [place];  Service: Gastroenterology;  Laterality: Left;  . LOOP RECORDER INSERTION N/A 06/20/2016   Procedure: Loop Recorder Insertion;  Surgeon: [name], MD;  Location: [place] CV LAB;  Service: Cardiovascular;  Laterality: N/A;  . REFRACTIVE SURGERY Bilateral    both eyes  . SPINE SURGERY     lumbar    Family History  Problem Relation Age of Onset  . Cancer Mother   . Heart disease Mother   . Diabetes Father   . Cancer Brother   . Cancer Brother   . Throat cancer Brother     Social History:  reports that she has been smoking cigarettes. She has a 30.00 pack-year smoking history. She has never used smokeless tobacco. She reports current alcohol use. She reports that she does not use drugs.  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    HYPERTENSION:  Treated with amlodipine 10 mg, indapamide 1.25 every other day and losartan 25 mg daily Losartan was added because of proteinuria Previously  lisinopril was stopped because of mild hyperkalemia and worsening renal function  Although she has been seen by nephrologist in February 2020 no records are available from even her previous visit and no recommendations have been made Blood pressure was again relatively higher on first measurement in the  office   BP Readings from Last 3 Encounters:  03/14/18 (!) 152/72  12/17/17 (!) 134/52  12/09/17 (!) 140/50     Renal function: Her creatinine is slightly higher She has seen a new nephrologist and no changes were made even though her blood pressure was reportedly higher Her proteinuria appears to be worse    Lab Results  Component Value Date   CREATININE 1.82 (H) 03/11/2018   CREATININE 1.65 (H) 01/01/2018   CREATININE 1.65 (H) 01/01/2018    Electrolyte abnormalities: No problems with low sodium or potassium increase recently  Lab Results  Component Value Date   NA 137 03/11/2018   K 4.4 03/11/2018   CL 106 03/11/2018   CO2 23 03/11/2018     HYPERLIPIDEMIA: The lipid abnormality consists of elevated LDL controlled with Lipitor 20 mg .   Her baseline LDL was 202 Her last LDL is as follows:  Lab Results  Component Value Date   CHOL 150 07/05/2017   HDL 52.90 07/05/2017   LDLCALC 67 07/05/2017   TRIG 155.0 (H) 07/05/2017   CHOLHDL 3 07/05/2017    Vitamin D: Taking 50,000 unit dosage once a month This is monitored by PCP    Lab Results  Component Value Date   VD25OH 40.22 09/13/2016       Last diabetic foot exam was done in 6/19  Takes Gabapentin 100mg  regularly twice daily with control of her lower leg leg pains, she says that her pain will be worse in her feet if she does not take this regularly    Examination:   BP (!) 152/72 (Patient Position: Sitting)   Pulse 92   Ht 5\' 3"  (1.6 m)   Wt 150 lb 12.8 oz (68.4 kg)   SpO2 96%   BMI 26.71 kg/m   Body mass index is 26.71 kg/m.   No pedal edema  ASSESSMENT/ PLAN:   HYPERTENSION:   Her blood pressure is higher than before Also her creatinine is slightly higher She does not think she has been going off her diet and does not eat any high sodium prepackaged meals  Now taking 25 mg losartan with Norvasc and every other day indapamide Sodium is stable despite taking indapamide now but not clear  how effective this is for blood pressure  CKD: Creatinine slightly higher may be from adding a little dose of losartan  Also has increased microalbumin which is worse now Normal potassium now at 4.6  Recommendations:  Trial of DILTIAZEM instead of amlodipine for blood pressure and better effect on proteinuria  Continue 25 mg losartan because of her proteinuria  Try taking indapamide daily now  Will need short-term follow-up in 6 weeks for rechecking her blood pressure  Diabetes type 2 on insulin  A1c has gone up significantly and not clear why She is still not monitoring at home but her niece has agreed to start testing periodically  Since her glucose is 200 after her usual breakfast he will increase her regular insulin to 12 units instead of 10   Total visit time for evaluation and management of multiple problems and counseling =25 minutes   Patient Instructions  Change amlodipine  Take indapamide daily  12 regular in  am   Julie Brewer 03/14/2018, 3:43 PM     Note: This office note was prepared with Dragon voice recognition system technology. Any transcriptional errors that result from this process are unintentional.

## 2018-03-14 NOTE — Patient Instructions (Addendum)
Change amlodipine  Take indapamide daily  12 regular in am

## 2018-03-17 ENCOUNTER — Ambulatory Visit (INDEPENDENT_AMBULATORY_CARE_PROVIDER_SITE_OTHER): Payer: Medicare Other

## 2018-03-17 ENCOUNTER — Telehealth: Payer: Self-pay | Admitting: Endocrinology

## 2018-03-17 DIAGNOSIS — R55 Syncope and collapse: Secondary | ICD-10-CM | POA: Diagnosis not present

## 2018-03-17 LAB — CUP PACEART REMOTE DEVICE CHECK
Date Time Interrogation Session: 20200217023956
MDC IDC PG IMPLANT DT: 20180523

## 2018-03-17 NOTE — Telephone Encounter (Signed)
Please check with pharmacist about why diltiazem which is generic is not covered

## 2018-03-17 NOTE — Telephone Encounter (Signed)
Patient's grand daughter called to advise that the medication that Dr [name] prescribed on 03/14/2018 was too expensive and that an alternative Rx be determined.  No one knew name of medication and patient cant read due to blindness.  Patients grand daughter requested a call back to the patient for additional information

## 2018-03-17 NOTE — Telephone Encounter (Signed)
According to your last office note, Diltiazem was the only new medication you prescribed during pt's last office visit. Do you have an alternative you would like to try?

## 2018-03-20 ENCOUNTER — Telehealth: Payer: Self-pay | Admitting: Endocrinology

## 2018-03-20 NOTE — Telephone Encounter (Signed)
Patients Daughter is calling in regards to a message that was sent back on Monday. They would like a call back to discuss the patients medication that Dr [name] prescribed.   Please  advise

## 2018-03-20 NOTE — Telephone Encounter (Signed)
Called pharmacy to get more information regarding Diltiazem. Pharmacy stated that the medication is covered, but the pt has a copay for this medication of over $200.

## 2018-03-20 NOTE — Telephone Encounter (Signed)
Called pt's daughter and gave her MD message. She verbalized understanding.

## 2018-03-20 NOTE — Telephone Encounter (Signed)
She can stay on amlodipine.  Will be taking her indapamide daily as previously discussed

## 2018-03-25 NOTE — Progress Notes (Signed)
[facility] Summary Report / Loop Recorder 

## 2018-03-26 ENCOUNTER — Ambulatory Visit (INDEPENDENT_AMBULATORY_CARE_PROVIDER_SITE_OTHER): Payer: Medicare Other | Admitting: Podiatry

## 2018-03-26 ENCOUNTER — Encounter: Payer: Self-pay | Admitting: Podiatry

## 2018-03-26 DIAGNOSIS — D689 Coagulation defect, unspecified: Secondary | ICD-10-CM

## 2018-03-26 DIAGNOSIS — E1151 Type 2 diabetes mellitus with diabetic peripheral angiopathy without gangrene: Secondary | ICD-10-CM

## 2018-03-26 DIAGNOSIS — B351 Tinea unguium: Secondary | ICD-10-CM

## 2018-03-26 DIAGNOSIS — Q828 Other specified congenital malformations of skin: Secondary | ICD-10-CM | POA: Diagnosis not present

## 2018-03-26 NOTE — Progress Notes (Signed)
Complaint:  Visit Type: Patient returns to my office for continued preventative foot care services. Complaint: Patient states" my nails have grown long and thick and become painful to walk and wear shoes" Patient has been diagnosed with DM with angiopathy.. The patient presents for preventative foot care services. No changes to ROS.  Patient is blind and taking pletal.  Podiatric Exam: Vascular: dorsalis pedis and posterior tibial pulses are weakly palpable bilateral. Capillary return is immediate. Temperature gradient is WNL. Skin turgor WNL  Sensorium: Normal Semmes Weinstein monofilament test. Normal tactile sensation bilaterally. Nail Exam: Pt has thick disfigured discolored nails with subungual debris noted bilateral entire nail hallux through fifth toenails Ulcer Exam: There is no evidence of ulcer or pre-ulcerative changes or infection. Orthopedic Exam: Muscle tone and strength are WNL. No limitations in general ROM. No crepitus or effusions noted. Foot type and digits show no abnormalities. Bony prominences are unremarkable. Skin:  Porokeratosis sub 2 right.. No infection or ulcers  Diagnosis:  Onychomycosis, , Pain in right toe, pain in left toes  Debride porokeratosis right foot  Treatment & Plan Procedures and Treatment: Consent by patient was obtained for treatment procedures.   Debridement of mycotic and hypertrophic toenails, 1 through 5 bilateral and clearing of subungual debris. No ulceration, no infection noted.  Return Visit-Office Procedure: Patient instructed to return to the office for a follow up visit 3 months for continued evaluation and treatment.    Gardiner Barefoot DPM

## 2018-04-03 ENCOUNTER — Other Ambulatory Visit: Payer: Self-pay | Admitting: Vascular Surgery

## 2018-04-03 ENCOUNTER — Other Ambulatory Visit: Payer: Self-pay | Admitting: Endocrinology

## 2018-04-18 ENCOUNTER — Other Ambulatory Visit: Payer: Self-pay

## 2018-04-18 ENCOUNTER — Ambulatory Visit (INDEPENDENT_AMBULATORY_CARE_PROVIDER_SITE_OTHER): Payer: Medicare Other | Admitting: *Deleted

## 2018-04-18 DIAGNOSIS — R55 Syncope and collapse: Secondary | ICD-10-CM

## 2018-04-19 LAB — CUP PACEART REMOTE DEVICE CHECK
Date Time Interrogation Session: 20200321033744
Implantable Pulse Generator Implant Date: 20180523

## 2018-04-24 NOTE — Progress Notes (Signed)
[facility] Summary Report / Loop Recorder 

## 2018-04-30 ENCOUNTER — Other Ambulatory Visit: Payer: Self-pay | Admitting: Endocrinology

## 2018-05-03 ENCOUNTER — Other Ambulatory Visit: Payer: Self-pay | Admitting: Vascular Surgery

## 2018-05-05 ENCOUNTER — Ambulatory Visit (INDEPENDENT_AMBULATORY_CARE_PROVIDER_SITE_OTHER): Payer: Medicare Other | Admitting: Endocrinology

## 2018-05-05 ENCOUNTER — Other Ambulatory Visit: Payer: Self-pay

## 2018-05-05 ENCOUNTER — Encounter: Payer: Self-pay | Admitting: Endocrinology

## 2018-05-05 VITALS — BP 158/70 | HR 84 | Ht 63.0 in | Wt 151.2 lb

## 2018-05-05 DIAGNOSIS — I10 Essential (primary) hypertension: Secondary | ICD-10-CM

## 2018-05-05 LAB — COMPREHENSIVE METABOLIC PANEL
ALT: 8 U/L (ref 0–35)
AST: 11 U/L (ref 0–37)
Albumin: 3.4 g/dL — ABNORMAL LOW (ref 3.5–5.2)
Alkaline Phosphatase: 110 U/L (ref 39–117)
BUN: 30 mg/dL — ABNORMAL HIGH (ref 6–23)
CO2: 21 mEq/L (ref 19–32)
Calcium: 8.9 mg/dL (ref 8.4–10.5)
Chloride: 107 mEq/L (ref 96–112)
Creatinine, Ser: 2.02 mg/dL — ABNORMAL HIGH (ref 0.40–1.20)
GFR: 29.86 mL/min — ABNORMAL LOW (ref >60.00)
Glucose, Bld: 176 mg/dL — ABNORMAL HIGH (ref 70–99)
Potassium: 4.2 mEq/L (ref 3.5–5.1)
Sodium: 137 mEq/L (ref 135–145)
Total Bilirubin: 0.2 mg/dL (ref 0.2–1.2)
Total Protein: 6.6 g/dL (ref 6.0–8.3)

## 2018-05-05 MED ORDER — LABETALOL HCL 100 MG PO TABS: 100.0000 mg | ORAL_TABLET | Freq: Two times a day (BID) | ORAL | 2 refills | Status: DC

## 2018-05-05 NOTE — Progress Notes (Signed)
Patient ID: Julie Brewer, female   DOB: [birth year], 68 y.o.   MRN: [id number]   Reason for Appointment: Follow-up of various problems, particularly hypertension  History of Present Illness    HYPERTENSION:  Treated with diltiazem 360 mg, indapamide 1.25 every day and losartan 25 mg daily Losartan 25 mg was added because of proteinuria On her last visit diltiazem was used instead of amlodipine to provide more efficacy and improved microalbuminuria potentially  Previously lisinopril was stopped because of mild hyperkalemia and worsening renal function  Although she has been seen by nephrologist in February 2020 no records are available from even her previous visit and no recommendations have been made  Blood pressure was again higher on first measurement in the office She does not measure at home  BP Readings from Last 3 Encounters:  05/05/18 (!) 158/70  03/14/18 (!) 152/72  12/17/17 (!) 134/52     Diagnosis: Type 2 DIABETES MELITUS, date of diagnosis:  1985  Prior history: She has been on insulin since diagnosis and on Lantus previously Also at some point had been started on Glucophage several years ago also Because of insurance preference Lantus was changed to Levemir  She refuses to use analog rapid acting insulin because of cost and is using regular insulin for several years   Her blood sugars are generally well controlled and A1c usually under 7% Her A1c previously was higher with stopping metformin at 8%  Recent history:   Insulin regimen: Lantus insulin 16 U in the morning daily.  Regular insulin 30 minutes Before eating, 10 units a.m. and 5 ac supper  Oral hypoglycemic drugs: Metformin ER 750 MG once daily    Her A1c in 03/2018 was 7.7  Fructosamine previously 229  Current blood sugar patterns, management and problems:  She is taking the same dose of Lantus since her last visit Not checking blood sugars at home and no labs available  Side  effects from medications: None      Glucometer:  FreeStyle, no home readings available  Meals:  usually 2 meals per day at 10 AM and 5 PM.     Mealtime protein sources:turkey, chicken.  Eating cereal for breakfast daily Avoiding sweet drinks  Physical activity: exercise: Some walking within the house              Wt Readings from Last 3 Encounters:  05/05/18 151 lb 3.2 oz (68.6 kg)  03/14/18 150 lb 12.8 oz (68.4 kg)  12/17/17 148 lb (67.1 kg)   Lab Results  Component Value Date   HGBA1C 7.7 (H) 03/11/2018   HGBA1C 6.6 (H) 12/06/2017   HGBA1C 6.1 07/05/2017   Lab Results  Component Value Date   MICROALBUR 196.7 (H) 03/11/2018   LDLCALC 67 07/05/2017   CREATININE 1.82 (H) 03/11/2018      Lab Results  Component Value Date   FRUCTOSAMINE 218 03/11/2018   FRUCTOSAMINE 229 09/13/2017    Other active problems: See review of systems    No visits with results within 1 Week(s) from this visit.  Latest known visit with results is:  Clinical Support on 04/18/2018  Component Date Value Ref Range Status  . Date Time Interrogation Session 04/19/2018 [id number]   Final  . Pulse Generator Manufacturer 04/19/2018 MERM   Final  . Pulse Gen Model 04/19/2018 [id number] [id number]   Final  . Pulse Gen Serial Number 04/19/2018 [id number]   Final  . Clinic Name 04/19/2018 [place]   Final  .  Implantable Pulse Generator Type 04/19/2018 ICM/ILR   Final  . Implantable Pulse Generator Implan* 04/19/2018 [id number]   Final    Allergies as of 05/05/2018      Reactions   Morphine And Related Other (See Comments)   Hallucenations    Penicillins Rash, Other (See Comments)   Swelling      Medication List       Accurate as of May 05, 2018  1:23 PM. Always use your most recent med list.        aspirin 325 MG EC tablet Take 325 mg by mouth daily.   atorvastatin 20 MG tablet Commonly known as:  LIPITOR TAKE 1 TABLET BY MOUTH ONCE DAILY   cilostazol 100 MG tablet Commonly  known as:  PLETAL Take 1 tablet by mouth twice daily   diltiazem 360 MG 24 hr capsule Commonly known as:  CARDIZEM CD Take 1 capsule (360 mg total) by mouth daily.   gabapentin 100 MG capsule Commonly known as:  NEURONTIN TAKE 1 CAPSULE BY MOUTH TWICE DAILY   glucose blood test strip Commonly known as:  FREESTYLE LITE Use as instructed to check blood sugar 1 times a day dx code E11.65   indapamide 1.25 MG tablet Commonly known as:  LOZOL Take 1 tablet (1.25 mg total) by mouth daily.   insulin glargine 100 UNIT/ML injection Commonly known as:  LANTUS Inject 0.14 mLs (14 Units total) into the skin daily.   IRON PO Take 1 tablet by mouth daily.   labetalol 100 MG tablet Commonly known as:  NORMODYNE Take 1 tablet (100 mg total) by mouth 2 (two) times daily.   losartan 25 MG tablet Commonly known as:  COZAAR Take 1 tablet by mouth once daily   metFORMIN 750 MG 24 hr tablet Commonly known as:  GLUCOPHAGE-XR Take 1 tablet by mouth once daily   NovoLIN R ReliOn 100 units/mL injection Generic drug:  insulin regular Inject into the skin 2 (two) times daily before a meal. INJECT 10 UNITS UNDER THE SKIN AT BREAKFAST, AND 5 UNITS IN THE EVENING.   omeprazole 20 MG capsule Commonly known as:  PRILOSEC TAKE 1 CAPSULE BY MOUTH ONCE DAILY (DUE  FOR  OFFICE  VISIT  THIS  MONTH)   Vitamin D (Ergocalciferol) 1.25 MG (50000 UT) Caps capsule Commonly known as:  DRISDOL TAKE ONE CAPSULE BY MOUTH EVERY 7 DAYS       Allergies:  Allergies  Allergen Reactions  . Morphine And Related Other (See Comments)    Hallucenations   . Penicillins Rash and Other (See Comments)    Swelling    Past Medical History:  Diagnosis Date  . Asthma    No probnlems recently  . 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 Diabetes mellitus    Type II per Dr [name]  (patient said type I)  . Fibroid   . GERD (gastroesophageal reflux disease)   .  Glaucoma   . Hyperlipidemia   . Hypertension   . Iron deficiency anemia 03/09/2016  . Peripheral vascular disease ([place])   . Pneumonia 2006  . Shortness of breath dyspnea    with exdrtion, "Walkling too fast"  . Stroke [facility])    no residual    Past Surgical History:  Procedure Laterality Date  . ABDOMINAL HYSTERECTOMY    . BIOPSY  06/10/2017   Procedure: BIOPSY;  Surgeon: Ronnette Juniper, MD;  Location: [place];  Service: Gastroenterology;;  . CERVICAL FUSION     with graft from hip  . COLONOSCOPY  July 09, 2012  . DIRECT LARYNGOSCOPY N/A 06/07/2014   Procedure: DIRECT LARYNGOSCOPY with BIOPSY and EXCISION VOLLECULAR CYST;  Surgeon: Ruby Cola, MD;  Location: [facility] OR;  Service: ENT;  Laterality: N/A;  . ESOPHAGOGASTRODUODENOSCOPY (EGD) WITH PROPOFOL Left 06/10/2017   Procedure: ESOPHAGOGASTRODUODENOSCOPY (EGD) WITH PROPOFOL;  Surgeon: Ronnette Juniper, MD;  Location: [place];  Service: Gastroenterology;  Laterality: Left;  . FLEXIBLE SIGMOIDOSCOPY Left 06/10/2017   Procedure: FLEXIBLE SIGMOIDOSCOPY;  Surgeon: Ronnette Juniper, MD;  Location: [place];  Service: Gastroenterology;  Laterality: Left;  . LOOP RECORDER INSERTION N/A 06/20/2016   Procedure: Loop Recorder Insertion;  Surgeon: [name], MD;  Location: [place] CV LAB;  Service: Cardiovascular;  Laterality: N/A;  . REFRACTIVE SURGERY Bilateral    both eyes  . SPINE SURGERY     lumbar    Family History  Problem Relation Age of Onset  . Cancer Mother   . Heart disease Mother   . Diabetes Father   . Cancer Brother   . Cancer Brother   . Throat cancer Brother     Social History:  reports that she has been smoking cigarettes. She has a 30.00 pack-year smoking history. She has never used smokeless tobacco. She reports current alcohol use. She reports that she does not use drugs.  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     Renal  function: Her creatinine is slightly higher as of 2/20 She has seen a new nephrologist and no changes were made No records available  She still has proteinuria   Lab Results  Component Value Date   CREATININE 1.82 (H) 03/11/2018   CREATININE 1.65 (H) 01/01/2018   CREATININE 1.65 (H) 01/01/2018    Electrolyte abnormalities: No problems with low sodium or hyperkalemia  Lab Results  Component Value Date   NA 137 03/11/2018   K 4.4 03/11/2018   CL 106 03/11/2018   CO2 23 03/11/2018     HYPERLIPIDEMIA: The lipid abnormality consists of elevated LDL controlled with Lipitor 20 mg .   Her baseline LDL was 202 Her last LDL is as follows:  Lab Results  Component Value Date   CHOL 150 07/05/2017   HDL 52.90 07/05/2017   LDLCALC 67 07/05/2017   TRIG 155.0 (H) 07/05/2017   CHOLHDL 3 07/05/2017    Vitamin D: Taking 50,000 unit dosage once a month This is monitored by PCP    Lab Results  Component Value Date   VD25OH 40.22 09/13/2016       Last diabetic foot exam was done in 6/19  Takes Gabapentin 100mg  regularly twice daily with control of her lower leg leg pains, she says that her pain will be worse in her feet if she does not take this regularly    Examination:   BP (!) 158/70 (BP Location: Left Arm, Cuff Size: Normal)   Pulse 84   Ht 5\' 3"  (1.6 m)   Wt 151 lb 3.2 oz (68.6 kg)   SpO2 97%   BMI 26.78 kg/m   Body mass index is 26.78 kg/m.   No pedal edema  ASSESSMENT/ PLAN:   HYPERTENSION:   Her blood pressure is more difficult to control This is likely to be from worsening renal function Has not benefited from taking Lozol every day Discussed with patient  that she will need additional medications now but also needs to follow-up with nephrologist  Recommendations: Start labetalol 100 mg twice daily in addition to current medications If her renal function is worse may stop indapamide completely Check renal function today as also sodium since she  previously had hyponatremia with daily indapamide  Diabetes type 2 on insulin  A1c to be checked on the next visit    There are no Patient Instructions on file for this visit.  Elayne Snare 05/05/2018, 1:23 PM     Note: This office note was prepared with Dragon voice recognition system technology. Any transcriptional errors that result from this process are unintentional.

## 2018-05-21 ENCOUNTER — Other Ambulatory Visit: Payer: Self-pay

## 2018-05-21 ENCOUNTER — Ambulatory Visit (INDEPENDENT_AMBULATORY_CARE_PROVIDER_SITE_OTHER): Payer: Medicare Other | Admitting: *Deleted

## 2018-05-21 DIAGNOSIS — R55 Syncope and collapse: Secondary | ICD-10-CM | POA: Diagnosis not present

## 2018-05-22 LAB — CUP PACEART REMOTE DEVICE CHECK
Date Time Interrogation Session: 20200423090345
Implantable Pulse Generator Implant Date: 20180523

## 2018-05-23 DIAGNOSIS — R609 Edema, unspecified: Secondary | ICD-10-CM | POA: Diagnosis not present

## 2018-05-23 DIAGNOSIS — D649 Anemia, unspecified: Secondary | ICD-10-CM | POA: Diagnosis not present

## 2018-05-23 DIAGNOSIS — E1122 Type 2 diabetes mellitus with diabetic chronic kidney disease: Secondary | ICD-10-CM | POA: Diagnosis not present

## 2018-05-23 DIAGNOSIS — N183 Chronic kidney disease, stage 3 (moderate): Secondary | ICD-10-CM | POA: Diagnosis not present

## 2018-05-23 DIAGNOSIS — Z720 Tobacco use: Secondary | ICD-10-CM | POA: Diagnosis not present

## 2018-05-23 DIAGNOSIS — E1129 Type 2 diabetes mellitus with other diabetic kidney complication: Secondary | ICD-10-CM | POA: Diagnosis not present

## 2018-05-23 DIAGNOSIS — I129 Hypertensive chronic kidney disease with stage 1 through stage 4 chronic kidney disease, or unspecified chronic kidney disease: Secondary | ICD-10-CM | POA: Diagnosis not present

## 2018-05-27 ENCOUNTER — Other Ambulatory Visit: Payer: Self-pay | Admitting: Endocrinology

## 2018-05-29 NOTE — Progress Notes (Signed)
[facility] Summary Report / Loop Recorder 

## 2018-06-03 ENCOUNTER — Other Ambulatory Visit: Payer: Self-pay

## 2018-06-03 ENCOUNTER — Encounter: Payer: Self-pay | Admitting: Endocrinology

## 2018-06-03 ENCOUNTER — Ambulatory Visit (INDEPENDENT_AMBULATORY_CARE_PROVIDER_SITE_OTHER): Payer: Medicare Other | Admitting: Endocrinology

## 2018-06-03 VITALS — BP 158/70 | HR 98 | Ht 63.0 in | Wt 153.8 lb

## 2018-06-03 DIAGNOSIS — E1165 Type 2 diabetes mellitus with hyperglycemia: Secondary | ICD-10-CM | POA: Diagnosis not present

## 2018-06-03 DIAGNOSIS — Z794 Long term (current) use of insulin: Secondary | ICD-10-CM

## 2018-06-03 DIAGNOSIS — I10 Essential (primary) hypertension: Secondary | ICD-10-CM | POA: Diagnosis not present

## 2018-06-03 LAB — POCT GLYCOSYLATED HEMOGLOBIN (HGB A1C): Hemoglobin A1C: 6.7 % — AB (ref 4.0–5.6)

## 2018-06-03 LAB — GLUCOSE, POCT (MANUAL RESULT ENTRY): POC Glucose: 61 mg/dl — AB (ref 70–99)

## 2018-06-03 NOTE — Patient Instructions (Signed)
Reduce R insuln in am by 2 units

## 2018-06-03 NOTE — Progress Notes (Signed)
Patient ID: Julie Brewer, female   DOB: May 20, 1950, 68 y.o.   MRN: [id number]   Reason for Appointment: Follow-up of various problems, including hypertension  History of Present Illness    HYPERTENSION:  Treated with diltiazem 360 mg, indapamide 1.25 every day and losartan 25 mg daily Losartan 25 mg was added because of proteinuria On her last visit diltiazem was used instead of amlodipine to provide more efficacy and improved microalbuminuria potentially  Previously lisinopril was stopped because of mild hyperkalemia and worsening renal function  More recently losartan was stopped because of rising creatinine  Difficult to know what blood pressure medication she is taking as she does not remember She was previously on diltiazem 360 mg but her nephrologist does not have this on her list and she put her back on amlodipine 5 mg when her blood pressure was in the 025K systolic Labetalol was started on her last visit about a month ago  Blood pressure was again higher on first measurement in the office She does not measure at home  BP Readings from Last 3 Encounters:  06/03/18 (!) 158/70  05/05/18 (!) 158/70  03/14/18 (!) 152/72     Diagnosis: Type 2 DIABETES MELITUS, date of diagnosis:  1985  Prior history: She has been on insulin since diagnosis and on Lantus previously Also at some point had been started on Glucophage several years ago also Because of insurance preference Lantus was changed to Levemir  She refuses to use analog rapid acting insulin because of cost and is using regular insulin for several years   Her blood sugars are generally well controlled and A1c usually under 7% Her A1c previously was higher with stopping metformin at 8%  Recent history:   Insulin regimen: Lantus insulin 16 U in the morning daily.  Regular insulin 30 minutes Before eating, 12 units a.m. and 5 ac supper  Oral hypoglycemic drugs: Metformin ER 750 MG once daily    Her  A1c in 03/2018 was 7.7 and is now 6.7  Fructosamine previously 229  Current blood sugar patterns, management and problems: She is taking the same dose of Lantus as before Not checking blood sugars at home Today her blood sugar in the office about 2 to 3 hours after breakfast is only 61, she also had a snack before she came She had cereal and is now taking 12 units of regular insulin instead of 10 and not clear why As before she has no symptoms of low sugars  Side effects from medications: None      Glucometer:  FreeStyle, no home readings available  Meals:  usually 2 meals per day at 10 AM and 5 PM.     Mealtime protein sources:turkey, chicken.  Eating cereal for breakfast daily Avoiding sweet drinks  Physical activity: exercise: Some walking within the house              Wt Readings from Last 3 Encounters:  06/03/18 153 lb 12.8 oz (69.8 kg)  05/05/18 151 lb 3.2 oz (68.6 kg)  03/14/18 150 lb 12.8 oz (68.4 kg)   Lab Results  Component Value Date   HGBA1C 6.7 (A) 06/03/2018   HGBA1C 7.7 (H) 03/11/2018   HGBA1C 6.6 (H) 12/06/2017   Lab Results  Component Value Date   MICROALBUR 196.7 (H) 03/11/2018   LDLCALC 67 07/05/2017   CREATININE 2.02 (H) 05/05/2018      Lab Results  Component Value Date   FRUCTOSAMINE 218 03/11/2018   FRUCTOSAMINE  229 09/13/2017    Other active problems: See review of systems    Office Visit on 06/03/2018  Component Date Value Ref Range Status  . Hemoglobin A1C 06/03/2018 6.7* 4.0 - 5.6 % Final  . POC Glucose 06/03/2018 61* 70 - 99 mg/dl Final    Allergies as of 06/03/2018      Reactions   Morphine And Related Other (See Comments)   Hallucenations    Penicillins Rash, Other (See Comments)   Swelling      Medication List       Accurate as of Jun 03, 2018  2:00 PM. Always use your most recent med list.        aspirin 325 MG EC tablet Take 325 mg by mouth daily.   atorvastatin 20 MG tablet Commonly known as:  LIPITOR Take 1  tablet by mouth once daily   cilostazol 100 MG tablet Commonly known as:  PLETAL Take 1 tablet by mouth twice daily   diltiazem 360 MG 24 hr capsule Commonly known as:  CARDIZEM CD Take 1 capsule (360 mg total) by mouth daily.   gabapentin 100 MG capsule Commonly known as:  NEURONTIN TAKE 1 CAPSULE BY MOUTH TWICE DAILY   glucose blood test strip Commonly known as:  FREESTYLE LITE Use as instructed to check blood sugar 1 times a day dx code E11.65   indapamide 1.25 MG tablet Commonly known as:  LOZOL Take 1 tablet (1.25 mg total) by mouth daily.   insulin glargine 100 UNIT/ML injection Commonly known as:  LANTUS Inject 0.14 mLs (14 Units total) into the skin daily.   IRON PO Take 1 tablet by mouth daily.   labetalol 100 MG tablet Commonly known as:  NORMODYNE Take 1 tablet (100 mg total) by mouth 2 (two) times daily.   losartan 25 MG tablet Commonly known as:  COZAAR Take 1 tablet by mouth once daily   metFORMIN 750 MG 24 hr tablet Commonly known as:  GLUCOPHAGE-XR Take 1 tablet by mouth once daily   NovoLIN R ReliOn 100 units/mL injection Generic drug:  insulin regular Inject into the skin 2 (two) times daily before a meal. INJECT 10 UNITS UNDER THE SKIN AT BREAKFAST, AND 5 UNITS IN THE EVENING.   omeprazole 20 MG capsule Commonly known as:  PRILOSEC TAKE 1 CAPSULE BY MOUTH ONCE DAILY (DUE  FOR  OFFICE  VISIT  THIS  MONTH)   Vitamin D (Ergocalciferol) 1.25 MG (50000 UT) Caps capsule Commonly known as:  DRISDOL TAKE ONE CAPSULE BY MOUTH EVERY 7 DAYS       Allergies:  Allergies  Allergen Reactions  . Morphine And Related Other (See Comments)    Hallucenations   . Penicillins Rash and Other (See Comments)    Swelling    Past Medical History:  Diagnosis Date  . Asthma    No probnlems recently  . 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 Diabetes mellitus    Type II per Dr [name]  (patient said type I)   . Fibroid   . GERD (gastroesophageal reflux disease)   . Glaucoma   . Hyperlipidemia   . Hypertension   . Iron deficiency anemia 03/09/2016  . Peripheral vascular disease ([place])   . Pneumonia 2006  . Shortness of breath dyspnea    with exdrtion, "Walkling too fast"  . Stroke [facility])    no residual    Past Surgical History:  Procedure Laterality Date  . ABDOMINAL HYSTERECTOMY    . BIOPSY  06/10/2017   Procedure: BIOPSY;  Surgeon: Ronnette Juniper, MD;  Location: [place];  Service: Gastroenterology;;  . CERVICAL FUSION     with graft from hip  . COLONOSCOPY  July 09, 2012  . DIRECT LARYNGOSCOPY N/A 06/07/2014   Procedure: DIRECT LARYNGOSCOPY with BIOPSY and EXCISION VOLLECULAR CYST;  Surgeon: Ruby Cola, MD;  Location: [facility] OR;  Service: ENT;  Laterality: N/A;  . ESOPHAGOGASTRODUODENOSCOPY (EGD) WITH PROPOFOL Left 06/10/2017   Procedure: ESOPHAGOGASTRODUODENOSCOPY (EGD) WITH PROPOFOL;  Surgeon: Ronnette Juniper, MD;  Location: [place];  Service: Gastroenterology;  Laterality: Left;  . FLEXIBLE SIGMOIDOSCOPY Left 06/10/2017   Procedure: FLEXIBLE SIGMOIDOSCOPY;  Surgeon: Ronnette Juniper, MD;  Location: [place];  Service: Gastroenterology;  Laterality: Left;  . LOOP RECORDER INSERTION N/A 06/20/2016   Procedure: Loop Recorder Insertion;  Surgeon: [name], MD;  Location: [place] CV LAB;  Service: Cardiovascular;  Laterality: N/A;  . REFRACTIVE SURGERY Bilateral    both eyes  . SPINE SURGERY     lumbar    Family History  Problem Relation Age of Onset  . Cancer Mother   . Heart disease Mother   . Diabetes Father   . Cancer Brother   . Cancer Brother   . Throat cancer Brother     Social History:  reports that she has been smoking cigarettes. She has a 30.00 pack-year smoking history. She has never used smokeless tobacco. She reports current alcohol use. She reports that she does not use drugs.  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     Renal function: Her creatinine is slightly higher as of 2/20 She has seen a new nephrologist and no changes were made No records available  She still has proteinuria   Lab Results  Component Value Date   CREATININE 2.02 (H) 05/05/2018   CREATININE 1.82 (H) 03/11/2018   CREATININE 1.65 (H) 01/01/2018   CREATININE 1.65 (H) 01/01/2018    Electrolyte abnormalities: No problems with low sodium or hyperkalemia  Lab Results  Component Value Date   NA 137 05/05/2018   K 4.2 05/05/2018   CL 107 05/05/2018   CO2 21 05/05/2018     HYPERLIPIDEMIA: The lipid abnormality consists of elevated LDL controlled with Lipitor 20 mg .   Her baseline LDL was 202 Her last LDL is as follows:  Lab Results  Component Value Date   CHOL 150 07/05/2017   HDL 52.90 07/05/2017   LDLCALC 67 07/05/2017   TRIG 155.0 (H) 07/05/2017   CHOLHDL 3 07/05/2017    Vitamin D: Taking 50,000 unit dosage once a month This is monitored by PCP    Lab Results  Component Value Date   VD25OH 40.22 09/13/2016       Last diabetic foot exam was done in 6/19  Takes Gabapentin 100mg  regularly twice daily with control of her lower leg leg pains, she says that her pain will be worse in her feet if she does not take this regularly    Examination:   BP (!) 158/70   Pulse 98   Ht 5\' 3"  (1.6 m)   Wt 153 lb 12.8 oz (69.8 kg)   SpO2 97%   BMI 27.24 kg/m   Body mass index is 27.24 kg/m.   No pedal edema  ASSESSMENT/ PLAN:   HYPERTENSION:   Her  blood pressure is consistently difficult to control Blood pressure is higher again and about the same as on her last visit a month ago This is despite starting labetalol Apparently the nephrologist does not have up-to-date blood pressure medication list and patient does not know what she is actually taking  She needs to call back with her blood pressure medication list and her doses will be adjusted Most likely will  need to increase her labetalol to 200 mg twice a day especially since her pulse is relatively fast May consider clonidine  Diabetes type 2 on insulin  Her A1c has improved She will reduce her regular insulin to 10 units for cereal and 8 units for other meals in the morning    Patient Instructions  Reduce R insuln in am by 2 units   Julie Brewer 06/03/2018, 2:00 PM     Note: This office note was prepared with Dragon voice recognition system technology. Any transcriptional errors that result from this process are unintentional.

## 2018-06-04 ENCOUNTER — Other Ambulatory Visit: Payer: Self-pay

## 2018-06-04 ENCOUNTER — Encounter: Payer: Self-pay | Admitting: Podiatry

## 2018-06-04 ENCOUNTER — Ambulatory Visit (INDEPENDENT_AMBULATORY_CARE_PROVIDER_SITE_OTHER): Payer: Medicare Other | Admitting: Podiatry

## 2018-06-04 VITALS — Temp 97.3°F

## 2018-06-04 DIAGNOSIS — Q828 Other specified congenital malformations of skin: Secondary | ICD-10-CM

## 2018-06-04 DIAGNOSIS — D689 Coagulation defect, unspecified: Secondary | ICD-10-CM

## 2018-06-04 DIAGNOSIS — M79676 Pain in unspecified toe(s): Secondary | ICD-10-CM | POA: Diagnosis not present

## 2018-06-04 DIAGNOSIS — E1151 Type 2 diabetes mellitus with diabetic peripheral angiopathy without gangrene: Secondary | ICD-10-CM

## 2018-06-04 DIAGNOSIS — B351 Tinea unguium: Secondary | ICD-10-CM

## 2018-06-04 NOTE — Progress Notes (Signed)
Complaint:  Visit Type: Patient returns to my office for continued preventative foot care services. Complaint: Patient states" my nails have grown long and thick and become painful to walk and wear shoes" Patient has been diagnosed with DM with angiopathy.. The patient presents for preventative foot care services. No changes to ROS.  Patient is blind and taking pletal.  Podiatric Exam: Vascular: dorsalis pedis and posterior tibial pulses are weakly palpable bilateral. Capillary return is immediate. Temperature gradient is WNL. Skin turgor WNL  Sensorium: Normal Semmes Weinstein monofilament test. Normal tactile sensation bilaterally. Nail Exam: Pt has thick disfigured discolored nails with subungual debris noted bilateral entire nail hallux through fifth toenails Ulcer Exam: There is no evidence of ulcer or pre-ulcerative changes or infection. Orthopedic Exam: Muscle tone and strength are WNL. No limitations in general ROM. No crepitus or effusions noted. Foot type and digits show no abnormalities. Bony prominences are unremarkable. Skin:  Porokeratosis sub 2 right.. No infection or ulcers  Diagnosis:  Onychomycosis, , Pain in right toe, pain in left toes  Debride porokeratosis right foot  Treatment & Plan Procedures and Treatment: Consent by patient was obtained for treatment procedures.   Debridement of mycotic and hypertrophic toenails, 1 through 5 bilateral and clearing of subungual debris. No ulceration, no infection noted.  Return Visit-Office Procedure: Patient instructed to return to the office for a follow up visit 3 months for continued evaluation and treatment.    Gardiner Barefoot DPM

## 2018-06-15 ENCOUNTER — Other Ambulatory Visit: Payer: Self-pay | Admitting: Endocrinology

## 2018-06-18 ENCOUNTER — Telehealth: Payer: Self-pay | Admitting: Family

## 2018-06-18 NOTE — Telephone Encounter (Signed)
Patient's daughter, [name], called.  Says pt is having pain, swelling, and warmth in her shin and would like to be seen at first available appointment.   Daughter verbalized understanding that we will call her back to schedule.   Thurston Hole., LPN

## 2018-06-19 ENCOUNTER — Ambulatory Visit (INDEPENDENT_AMBULATORY_CARE_PROVIDER_SITE_OTHER)
Admission: RE | Admit: 2018-06-19 | Discharge: 2018-06-19 | Disposition: A | Discharge status: Home / Self Care | Payer: Medicare Other | Source: Ambulatory Visit | Attending: Family | Admitting: Family

## 2018-06-19 ENCOUNTER — Encounter: Payer: Self-pay | Admitting: Family

## 2018-06-19 ENCOUNTER — Ambulatory Visit (INDEPENDENT_AMBULATORY_CARE_PROVIDER_SITE_OTHER): Payer: Medicare Other | Admitting: Family

## 2018-06-19 ENCOUNTER — Other Ambulatory Visit: Payer: Self-pay

## 2018-06-19 ENCOUNTER — Other Ambulatory Visit (HOSPITAL_COMMUNITY): Payer: Self-pay | Admitting: Family

## 2018-06-19 VITALS — BP 132/64 | HR 88 | Temp 98.5°F | Resp 16 | Ht 63.0 in | Wt 160.0 lb

## 2018-06-19 DIAGNOSIS — N179 Acute kidney failure, unspecified: Secondary | ICD-10-CM | POA: Diagnosis not present

## 2018-06-19 DIAGNOSIS — E871 Hypo-osmolality and hyponatremia: Secondary | ICD-10-CM | POA: Diagnosis not present

## 2018-06-19 DIAGNOSIS — E1151 Type 2 diabetes mellitus with diabetic peripheral angiopathy without gangrene: Secondary | ICD-10-CM

## 2018-06-19 DIAGNOSIS — M7989 Other specified soft tissue disorders: Secondary | ICD-10-CM

## 2018-06-19 DIAGNOSIS — Z20828 Contact with and (suspected) exposure to other viral communicable diseases: Secondary | ICD-10-CM | POA: Diagnosis not present

## 2018-06-19 DIAGNOSIS — M25561 Pain in right knee: Secondary | ICD-10-CM | POA: Diagnosis not present

## 2018-06-19 DIAGNOSIS — J9601 Acute respiratory failure with hypoxia: Secondary | ICD-10-CM | POA: Diagnosis not present

## 2018-06-19 DIAGNOSIS — R0600 Dyspnea, unspecified: Secondary | ICD-10-CM

## 2018-06-19 DIAGNOSIS — I779 Disorder of arteries and arterioles, unspecified: Secondary | ICD-10-CM | POA: Diagnosis not present

## 2018-06-19 DIAGNOSIS — F17200 Nicotine dependence, unspecified, uncomplicated: Secondary | ICD-10-CM | POA: Diagnosis not present

## 2018-06-19 DIAGNOSIS — D638 Anemia in other chronic diseases classified elsewhere: Secondary | ICD-10-CM | POA: Diagnosis not present

## 2018-06-19 DIAGNOSIS — R0609 Other forms of dyspnea: Secondary | ICD-10-CM

## 2018-06-19 DIAGNOSIS — M79604 Pain in right leg: Secondary | ICD-10-CM | POA: Insufficient documentation

## 2018-06-19 DIAGNOSIS — I110 Hypertensive heart disease with heart failure: Secondary | ICD-10-CM | POA: Diagnosis not present

## 2018-06-19 DIAGNOSIS — I509 Heart failure, unspecified: Secondary | ICD-10-CM | POA: Diagnosis not present

## 2018-06-19 DIAGNOSIS — M79661 Pain in right lower leg: Secondary | ICD-10-CM

## 2018-06-19 DIAGNOSIS — I5033 Acute on chronic diastolic (congestive) heart failure: Secondary | ICD-10-CM | POA: Diagnosis not present

## 2018-06-19 DIAGNOSIS — R0602 Shortness of breath: Secondary | ICD-10-CM | POA: Diagnosis not present

## 2018-06-19 DIAGNOSIS — I130 Hypertensive heart and chronic kidney disease with heart failure and stage 1 through stage 4 chronic kidney disease, or unspecified chronic kidney disease: Secondary | ICD-10-CM | POA: Diagnosis not present

## 2018-06-19 NOTE — Progress Notes (Signed)
[facility] [place]   CC: Follow up peripheral artery occlusive disease  History of Present Illness Julie Brewer is a 68 y.o. female whom Dr. 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   She returns today with c/o right knee and calf pain and swelling that started last week. She reports worse dyspnea in the last week, also worse at rest in the last week.  She states she took coumadin for blood clots in her legs in 1978, states she was told it was due to her use of oral contraceptive.  She states she had no swelling or pain, no claudication in her legs up until a week ago.   When Dr. Trula Slade met her in 2011 she was having claudication in her left leg at approximately one block. We treated her with cilostazol, and she did much better.  She denies non healing wounds.  She states she had a TIA in about 2010 as manifested by slurred speech that resolved, denies hemiparesis, denies further loss of vision. She states that her loss of vision in both eyes is due to glaucoma.  She continues to be medically managed for hypertension and hyperlipidemia as well as her diabetes.   She continues on Pletal.   She has a loop recorder, was evaluated by Dr. Gwenlyn Found for syncope, states she has had no more syncope since early 2019.   Diabetic: Yes, her last A1C was 6.7 in April 2020 per pt  Tobacco use: smoker  (decreased to1/2 ppd, was 1 ppd, started at age 43 yrs)  Pt meds include: Statin :Yes Betablocker: yes ASA: Yes, 325 mg Other anticoagulants/antiplatelets: no  Past Medical History:  Diagnosis Date  . Asthma    No probnlems recently  . 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 Diabetes mellitus    Type II per Dr [name]  (patient said type I)  . Fibroid   . GERD (gastroesophageal reflux disease)   . Glaucoma   . Hyperlipidemia   . Hypertension   . Iron deficiency anemia 03/09/2016   . Peripheral vascular disease ([place])   . Pneumonia 2006  . Shortness of breath dyspnea    with exdrtion, "Walkling too fast"  . Stroke [facility])    no residual    Social History Social History   Tobacco Use  . Smoking status: Current Every Day Smoker    Packs/day: 1.00    Years: 30.00    Pack years: 30.00    Types: Cigarettes  . Smokeless tobacco: Never Used  Substance Use Topics  . Alcohol use: Yes    Comment: socially  . Drug use: Yes    Types: Methylphenidate    Family History Family History  Problem Relation Age of Onset  . Cancer Mother   . Heart disease Mother   . Diabetes Father   . Cancer Brother   . Cancer Brother   . Throat cancer Brother     Past Surgical History:  Procedure Laterality Date  . ABDOMINAL HYSTERECTOMY    . BIOPSY  06/10/2017   Procedure: BIOPSY;  Surgeon: Ronnette Juniper, MD;  Location: [place];  Service: Gastroenterology;;  . CERVICAL FUSION     with graft from hip  . COLONOSCOPY  July 09, 2012  . DIRECT LARYNGOSCOPY N/A 06/07/2014   Procedure: DIRECT LARYNGOSCOPY with BIOPSY and EXCISION VOLLECULAR CYST;  Surgeon: Ruby Cola, MD;  Location: [place];  Service:  ENT;  Laterality: N/A;  . ESOPHAGOGASTRODUODENOSCOPY (EGD) WITH PROPOFOL Left 06/10/2017   Procedure: ESOPHAGOGASTRODUODENOSCOPY (EGD) WITH PROPOFOL;  Surgeon: Ronnette Juniper, MD;  Location: [place];  Service: Gastroenterology;  Laterality: Left;  . FLEXIBLE SIGMOIDOSCOPY Left 06/10/2017   Procedure: FLEXIBLE SIGMOIDOSCOPY;  Surgeon: Ronnette Juniper, MD;  Location: [place];  Service: Gastroenterology;  Laterality: Left;  . LOOP RECORDER INSERTION N/A 06/20/2016   Procedure: Loop Recorder Insertion;  Surgeon: [name], MD;  Location: [place] [facility];  Service: Cardiovascular;  Laterality: N/A;  . REFRACTIVE SURGERY Bilateral    both eyes  . SPINE SURGERY     lumbar    Allergies  Allergen Reactions  . Morphine And Related Other (See Comments)    Hallucenations   .  Penicillins Rash and Other (See Comments)    Swelling    Current Outpatient Medications  Medication Sig Dispense Refill  . aspirin 325 MG EC tablet Take 325 mg by mouth daily.     Marland Kitchen atorvastatin (LIPITOR) 20 MG tablet Take 1 tablet by mouth once daily 90 tablet 0  . cilostazol (PLETAL) 100 MG tablet Take 1 tablet by mouth twice daily 60 tablet 5  . diltiazem (CARDIZEM CD) 360 MG 24 hr capsule Take 1 capsule (360 mg total) by mouth daily. 30 capsule 3  . gabapentin (NEURONTIN) 100 MG capsule Take 1 capsule by mouth twice daily 180 capsule 0  . glucose blood (FREESTYLE LITE) test strip Use as instructed to check blood sugar 1 times a day dx code E11.65 50 each 1  . indapamide (LOZOL) 1.25 MG tablet Take 1 tablet (1.25 mg total) by mouth daily. 90 tablet 3  . insulin glargine (LANTUS) 100 UNIT/ML injection Inject 0.14 mLs (14 Units total) into the skin daily. (Patient taking differently: Inject 16 Units into the skin daily. INJECT 16 UNITS UNDER THE SKIN ONCE DAILY.) 10 mL 3  . insulin regular (NOVOLIN R RELION) 100 units/mL injection Inject into the skin 2 (two) times daily before a meal. INJECT 10 UNITS UNDER THE SKIN AT BREAKFAST, AND 5 UNITS IN THE EVENING.    . IRON PO Take 1 tablet by mouth daily.     Marland Kitchen labetalol (NORMODYNE) 100 MG tablet Take 1 tablet (100 mg total) by mouth 2 (two) times daily. 60 tablet 2  . losartan (COZAAR) 25 MG tablet Take 1 tablet by mouth once daily 30 tablet 0  . metFORMIN (GLUCOPHAGE-XR) 750 MG 24 hr tablet Take 1 tablet by mouth once daily 90 tablet 0  . omeprazole (PRILOSEC) 20 MG capsule TAKE 1 CAPSULE BY MOUTH ONCE DAILY (DUE  FOR  OFFICE  VISIT  THIS  MONTH) 90 capsule 0  . Vitamin D, Ergocalciferol, (DRISDOL) 1.25 MG (50000 UT) CAPS capsule TAKE ONE CAPSULE BY MOUTH EVERY 7 DAYS 12 capsule 0   No current facility-administered medications for this visit.     ROS: See HPI for pertinent positives and negatives.   Physical Examination  Vitals:    06/19/18 0934  BP: 132/64  Pulse: 88  Resp: 16  Temp: 98.5 F (36.9 C)  TempSrc: Oral  SpO2: 90%  Weight: 160 lb (72.6 kg)  Height: '5\' 3"'$  (1.6 m)   Body mass index is 28.34 kg/m.  General: A&O x 3, WDWN, female. Gait: slow, using cane HENT: No gross abnormalities.  Eyes: PERRLA. Pulmonary: Respirations are labored with bending over and walking a few feet, CTAB, good air movement in all fields, no rales, rhonchi, or wheezing Cardiac:  regular rhythm, no detected murmur.         Carotid Bruits Right Left   Negative Negative   Radial pulses are 2+ palpable bilaterally   Adominal aortic pulse is NOT palpable                         VASCULAR EXAM: Extremities without ischemic changes, without Gangrene; without open wounds. Both calves with 1+ pitting edema, right calf with 1+ non pitting edema also; lateral aspect of right knee is moderately painful to moderate palpation. No erythema. No cellulitis.                                                                                                          LE Pulses Right Left       FEMORAL  2+ palpable  2+ palpable        POPLITEAL  not palpable   not palpable       POSTERIOR TIBIAL  2+ palpable   not palpable        DORSALIS PEDIS      ANTERIOR TIBIAL 2+ palpable  1+ palpable    Abdomen: soft, NT, no palpable masses. Skin: no rashes, no cellulitis, no ulcers noted. Musculoskeletal: no muscle wasting or atrophy.  Neurologic: A&O X 3; appropriate affect, Sensation is normal; MOTOR FUNCTION:  moving all extremities equally, motor strength 5/5 throughout. Speech is fluent/normal. CN 2-12 intact. Psychiatric: Thought content is normal, mood appropriate for clinical situation.    ASSESSMENT: ANIQUE BECKLEY is a 68 y.o. female who has a history ofmild left calf claudication, but currently denies claudication symptoms. She was climbing and descends stairs several times/day, may not be walking much due to her inability to  see, is blind. She therefore may not walk enough to elicit claudication symptoms.   Her atherosclerotic risk factors include currently controlled DM and active smoking. She states that her DM has come under control since she has been under the care of Dr. [name], endocrinologist. She states her DM was uncontrolled for a long time prior to this.  She does not walk much since she is almost blind. See Plan.  Shestates thatPletal likely helps prevent her claudication.  She has significant dyspnea for a week, lungs are clear with good air movement in all fields, has right knee and upper lateral calf swelling and pain x 1 week. Venous duplex of her right leg today show no DVT, no popliteal cyst, no superficial venous thrombosis.  There is no evidence of cellulitis of her right leg, she has palpable bilateral pedal pulses with no signs of ischemia of her lower extremities.  I advised her to call her PCP's office today and seek advice re her signficant dyspnea after walking a few feet, or bending over to tie her shoes. She has no dyspnea at rest.  I also advised her that if she feels that her shortness of breath is significant, her daughter with her today could take her to the closest emergency dept, or she or her  daughter could call 911 if her breathing worsens.     DATA  Venous Duplex right leg (06-19-18): No DVT, no popliteal cyst, no superficial venous thrombosis.   ABI (Date: 09/24/2017):  R:  ? ABI: 1.07 (was 1.04 on 09-24-16),  ? PT: tri ? DP: tri ? TBI:  0.71 (was 0.80)  L:  ? ABI: 0.71 (was 0.73),  ? PT: bi ? DP: tri ? TBI: 0.61, toe pressure 92 (was 0.64) Bilateral ABI's remains stable: right is normal with triphasic waveforms, left with moderate arterial occlusive disease, tri and biphasic waveforms.       PLAN:  Based on the patient's vascular studies and examination, pt will return to clinic in August 2020 for ABI's.   I discussed in depth with the patient the  nature of atherosclerosis, and emphasized the importance of maximal medical management including strict control of blood pressure, blood glucose, and lipid levels, obtaining regular exercise, and cessation of smoking.  The patient is aware that without maximal medical management the underlying atherosclerotic disease process will progress, limiting the benefit of any interventions.  The patient was given information about PAD including signs, symptoms, treatment, what symptoms should prompt the patient to seek immediate medical care, and risk reduction measures to take.  Clemon Chambers, RN, MSN, FNP-C [facility] Phone: [phone]  Clinic MD: [employer]  06/19/18 9:58 AM

## 2018-06-19 NOTE — Patient Instructions (Signed)
Steps to Quit Smoking  Smoking tobacco can be bad for your health. It can also affect almost every organ in your body. Smoking puts you and people around you at risk for many serious long-lasting (chronic) diseases. Quitting smoking is hard, but it is one of the best things that you can do for your health. It is never too late to quit. What are the benefits of quitting smoking? When you quit smoking, you lower your risk for getting serious diseases and conditions. They can include:  Lung cancer or lung disease.  Heart disease.  Stroke.  Heart attack.  Not being able to have children (infertility).  Weak bones (osteoporosis) and broken bones (fractures). If you have coughing, wheezing, and shortness of breath, those symptoms may get better when you quit. You may also get sick less often. If you are pregnant, quitting smoking can help to lower your chances of having a baby of low birth weight. What can I do to help me quit smoking? Talk with your doctor about what can help you quit smoking. Some things you can do (strategies) include:  Quitting smoking totally, instead of slowly cutting back how much you smoke over a period of time.  Going to in-person counseling. You are more likely to quit if you go to many counseling sessions.  Using resources and support systems, such as: ? Online chats with a counselor. ? Phone quitlines. ? Printed self-help materials. ? Support groups or group counseling. ? Text messaging programs. ? Mobile phone apps or applications.  Taking medicines. Some of these medicines may have nicotine in them. If you are pregnant or breastfeeding, do not take any medicines to quit smoking unless your doctor says it is okay. Talk with your doctor about counseling or other things that can help you. Talk with your doctor about using more than one strategy at the same time, such as taking medicines while you are also going to in-person counseling. This can help make  quitting easier. What things can I do to make it easier to quit? Quitting smoking might feel very hard at first, but there is a lot that you can do to make it easier. Take these steps:  Talk to your family and friends. Ask them to support and encourage you.  Call phone quitlines, reach out to support groups, or work with a counselor.  Ask people who smoke to not smoke around you.  Avoid places that make you want (trigger) to smoke, such as: ? Bars. ? Parties. ? Smoke-break areas at work.  Spend time with people who do not smoke.  Lower the stress in your life. Stress can make you want to smoke. Try these things to help your stress: ? Getting regular exercise. ? Deep-breathing exercises. ? Yoga. ? Meditating. ? Doing a body scan. To do this, close your eyes, focus on one area of your body at a time from head to toe, and notice which parts of your body are tense. Try to relax the muscles in those areas.  Download or buy apps on your mobile phone or tablet that can help you stick to your quit plan. There are many free apps, such as QuitGuide from the CDC (Centers for Disease Control and Prevention). You can find more support from smokefree.gov and other websites. This information is not intended to replace advice given to you by your health care provider. Make sure you discuss any questions you have with your health care provider. Document Released: 11/11/2008 Document Revised: 09/13/2015   Document Reviewed: 06/01/2014 Elsevier Interactive Patient Education  2019 [employer] Inc.     Peripheral Vascular Disease  Peripheral vascular disease (PVD) is a disease of the blood vessels that are not part of your heart and brain. A simple term for PVD is poor circulation. In most cases, PVD narrows the blood vessels that carry blood from your heart to the rest of your body. This can reduce the supply of blood to your arms, legs, and internal organs, like your stomach or kidneys. However, PVD most  often affects a person's lower legs and feet. Without treatment, PVD tends to get worse. PVD can also lead to acute ischemic limb. This is when an arm or leg suddenly cannot get enough blood. This is a medical emergency. Follow these instructions at home: Lifestyle  Do not use any products that contain nicotine or tobacco, such as cigarettes and e-cigarettes. If you need help quitting, ask your doctor.  Lose weight if you are overweight. Or, stay at a healthy weight as told by your doctor.  Eat a diet that is low in fat and cholesterol. If you need help, ask your doctor.  Exercise regularly. Ask your doctor for activities that are right for you. General instructions  Take over-the-counter and prescription medicines only as told by your doctor.  Take good care of your feet: ? Wear comfortable shoes that fit well. ? Check your feet often for any cuts or sores.  Keep all follow-up visits as told by your doctor This is important. Contact a doctor if:  You have cramps in your legs when you walk.  You have leg pain when you are at rest.  You have coldness in a leg or foot.  Your skin changes.  You are unable to get or have an erection (erectile dysfunction).  You have cuts or sores on your feet that do not heal. Get help right away if:  Your arm or leg turns cold, numb, and blue.  Your arms or legs become red, warm, swollen, painful, or numb.  You have chest pain.  You have trouble breathing.  You suddenly have weakness in your face, arm, or leg.  You become very confused or you cannot speak.  You suddenly have a very bad headache.  You suddenly cannot see. Summary  Peripheral vascular disease (PVD) is a disease of the blood vessels.  A simple term for PVD is poor circulation. Without treatment, PVD tends to get worse.  Treatment may include exercise, low fat and low cholesterol diet, and quitting smoking. This information is not intended to replace advice given to  you by your health care provider. Make sure you discuss any questions you have with your health care provider. Document Released: 04/11/2009 Document Revised: 02/23/2016 Document Reviewed: 02/23/2016 Elsevier Interactive Patient Education  2019 Elsevier Inc.  

## 2018-06-20 ENCOUNTER — Other Ambulatory Visit: Payer: Self-pay

## 2018-06-20 ENCOUNTER — Emergency Department (HOSPITAL_COMMUNITY): Payer: Medicare Other

## 2018-06-20 ENCOUNTER — Inpatient Hospital Stay (HOSPITAL_COMMUNITY)
Admission: EM | Admit: 2018-06-20 | Discharge: 2018-06-25 | DRG: 291 | Disposition: A | Discharge status: Home Health | Payer: Medicare Other | Attending: Internal Medicine | Admitting: Internal Medicine

## 2018-06-20 DIAGNOSIS — Z66 Do not resuscitate: Secondary | ICD-10-CM | POA: Diagnosis present

## 2018-06-20 DIAGNOSIS — R0602 Shortness of breath: Secondary | ICD-10-CM

## 2018-06-20 DIAGNOSIS — N179 Acute kidney failure, unspecified: Secondary | ICD-10-CM | POA: Diagnosis not present

## 2018-06-20 DIAGNOSIS — Z720 Tobacco use: Secondary | ICD-10-CM | POA: Diagnosis not present

## 2018-06-20 DIAGNOSIS — K219 Gastro-esophageal reflux disease without esophagitis: Secondary | ICD-10-CM | POA: Diagnosis present

## 2018-06-20 DIAGNOSIS — H547 Unspecified visual loss: Secondary | ICD-10-CM | POA: Diagnosis present

## 2018-06-20 DIAGNOSIS — D649 Anemia, unspecified: Secondary | ICD-10-CM

## 2018-06-20 DIAGNOSIS — J9601 Acute respiratory failure with hypoxia: Secondary | ICD-10-CM | POA: Diagnosis present

## 2018-06-20 DIAGNOSIS — I5033 Acute on chronic diastolic (congestive) heart failure: Secondary | ICD-10-CM | POA: Diagnosis present

## 2018-06-20 DIAGNOSIS — H409 Unspecified glaucoma: Secondary | ICD-10-CM | POA: Diagnosis present

## 2018-06-20 DIAGNOSIS — R0902 Hypoxemia: Secondary | ICD-10-CM | POA: Diagnosis not present

## 2018-06-20 DIAGNOSIS — Z981 Arthrodesis status: Secondary | ICD-10-CM

## 2018-06-20 DIAGNOSIS — Z885 Allergy status to narcotic agent status: Secondary | ICD-10-CM

## 2018-06-20 DIAGNOSIS — Z794 Long term (current) use of insulin: Secondary | ICD-10-CM

## 2018-06-20 DIAGNOSIS — I509 Heart failure, unspecified: Secondary | ICD-10-CM | POA: Diagnosis not present

## 2018-06-20 DIAGNOSIS — Z808 Family history of malignant neoplasm of other organs or systems: Secondary | ICD-10-CM

## 2018-06-20 DIAGNOSIS — I110 Hypertensive heart disease with heart failure: Secondary | ICD-10-CM | POA: Diagnosis not present

## 2018-06-20 DIAGNOSIS — Z79899 Other long term (current) drug therapy: Secondary | ICD-10-CM | POA: Diagnosis not present

## 2018-06-20 DIAGNOSIS — Z90710 Acquired absence of both cervix and uterus: Secondary | ICD-10-CM

## 2018-06-20 DIAGNOSIS — Z833 Family history of diabetes mellitus: Secondary | ICD-10-CM

## 2018-06-20 DIAGNOSIS — E8770 Fluid overload, unspecified: Secondary | ICD-10-CM | POA: Diagnosis not present

## 2018-06-20 DIAGNOSIS — F17210 Nicotine dependence, cigarettes, uncomplicated: Secondary | ICD-10-CM | POA: Diagnosis present

## 2018-06-20 DIAGNOSIS — D638 Anemia in other chronic diseases classified elsewhere: Secondary | ICD-10-CM | POA: Diagnosis not present

## 2018-06-20 DIAGNOSIS — Z8673 Personal history of transient ischemic attack (TIA), and cerebral infarction without residual deficits: Secondary | ICD-10-CM | POA: Diagnosis not present

## 2018-06-20 DIAGNOSIS — Z7982 Long term (current) use of aspirin: Secondary | ICD-10-CM | POA: Diagnosis not present

## 2018-06-20 DIAGNOSIS — J449 Chronic obstructive pulmonary disease, unspecified: Secondary | ICD-10-CM | POA: Diagnosis present

## 2018-06-20 DIAGNOSIS — R609 Edema, unspecified: Secondary | ICD-10-CM | POA: Diagnosis not present

## 2018-06-20 DIAGNOSIS — N189 Chronic kidney disease, unspecified: Secondary | ICD-10-CM | POA: Diagnosis not present

## 2018-06-20 DIAGNOSIS — R140 Abdominal distension (gaseous): Secondary | ICD-10-CM

## 2018-06-20 DIAGNOSIS — E785 Hyperlipidemia, unspecified: Secondary | ICD-10-CM | POA: Diagnosis present

## 2018-06-20 DIAGNOSIS — I739 Peripheral vascular disease, unspecified: Secondary | ICD-10-CM | POA: Diagnosis not present

## 2018-06-20 DIAGNOSIS — D631 Anemia in chronic kidney disease: Secondary | ICD-10-CM | POA: Diagnosis present

## 2018-06-20 DIAGNOSIS — E1151 Type 2 diabetes mellitus with diabetic peripheral angiopathy without gangrene: Secondary | ICD-10-CM | POA: Diagnosis not present

## 2018-06-20 DIAGNOSIS — R0600 Dyspnea, unspecified: Secondary | ICD-10-CM | POA: Diagnosis not present

## 2018-06-20 DIAGNOSIS — Z20828 Contact with and (suspected) exposure to other viral communicable diseases: Secondary | ICD-10-CM | POA: Diagnosis present

## 2018-06-20 DIAGNOSIS — N183 Chronic kidney disease, stage 3 (moderate): Secondary | ICD-10-CM | POA: Diagnosis present

## 2018-06-20 DIAGNOSIS — I10 Essential (primary) hypertension: Secondary | ICD-10-CM | POA: Diagnosis not present

## 2018-06-20 DIAGNOSIS — E871 Hypo-osmolality and hyponatremia: Secondary | ICD-10-CM | POA: Diagnosis not present

## 2018-06-20 DIAGNOSIS — Z880 Allergy status to penicillin: Secondary | ICD-10-CM

## 2018-06-20 DIAGNOSIS — I130 Hypertensive heart and chronic kidney disease with heart failure and stage 1 through stage 4 chronic kidney disease, or unspecified chronic kidney disease: Principal | ICD-10-CM | POA: Diagnosis present

## 2018-06-20 DIAGNOSIS — R55 Syncope and collapse: Secondary | ICD-10-CM | POA: Diagnosis not present

## 2018-06-20 DIAGNOSIS — Z8249 Family history of ischemic heart disease and other diseases of the circulatory system: Secondary | ICD-10-CM

## 2018-06-20 DIAGNOSIS — Z86718 Personal history of other venous thrombosis and embolism: Secondary | ICD-10-CM

## 2018-06-20 DIAGNOSIS — E1122 Type 2 diabetes mellitus with diabetic chronic kidney disease: Secondary | ICD-10-CM | POA: Diagnosis present

## 2018-06-20 DIAGNOSIS — I5031 Acute diastolic (congestive) heart failure: Secondary | ICD-10-CM | POA: Diagnosis not present

## 2018-06-20 LAB — BASIC METABOLIC PANEL
Anion gap: 15 (ref 5–15)
BUN: 29 mg/dL — ABNORMAL HIGH (ref 8–23)
CO2: 13 mmol/L — ABNORMAL LOW (ref 22–32)
Calcium: 8.7 mg/dL — ABNORMAL LOW (ref 8.9–10.3)
Chloride: 110 mmol/L (ref 98–111)
Creatinine, Ser: 3.11 mg/dL — ABNORMAL HIGH (ref 0.44–1.00)
GFR calc Af Amer: 17 mL/min — ABNORMAL LOW (ref >60)
GFR calc non Af Amer: 15 mL/min — ABNORMAL LOW (ref >60)
Glucose, Bld: 72 mg/dL (ref 70–99)
Potassium: 4.9 mmol/L (ref 3.5–5.1)
Sodium: 138 mmol/L (ref 135–145)

## 2018-06-20 LAB — URINALYSIS, ROUTINE W REFLEX MICROSCOPIC
Bilirubin Urine: NEGATIVE
Glucose, UA: NEGATIVE mg/dL
Hgb urine dipstick: NEGATIVE
Ketones, ur: NEGATIVE mg/dL
Leukocytes,Ua: NEGATIVE
Nitrite: NEGATIVE
Protein, ur: 100 mg/dL — AB
Specific Gravity, Urine: 1.008 (ref 1.005–1.030)
pH: 6 (ref 5.0–8.0)

## 2018-06-20 LAB — POCT I-STAT EG7
Acid-base deficit: 10 mmol/L — ABNORMAL HIGH (ref 0.0–2.0)
Bicarbonate: 15.7 mmol/L — ABNORMAL LOW (ref 20.0–28.0)
Calcium, Ion: 1.12 mmol/L — ABNORMAL LOW (ref 1.15–1.40)
HCT: 30 % — ABNORMAL LOW (ref 36.0–46.0)
Hemoglobin: 10.2 g/dL — ABNORMAL LOW (ref 12.0–15.0)
O2 Saturation: 94 %
Potassium: 5 mmol/L (ref 3.5–5.1)
Sodium: 138 mmol/L (ref 135–145)
TCO2: 17 mmol/L — ABNORMAL LOW (ref 22–32)
pCO2, Ven: 31.5 mmHg — ABNORMAL LOW (ref 44.0–60.0)
pH, Ven: 7.307 (ref 7.250–7.430)
pO2, Ven: 76 mmHg — ABNORMAL HIGH (ref 32.0–45.0)

## 2018-06-20 LAB — SARS CORONAVIRUS 2 BY RT PCR (HOSPITAL ORDER, PERFORMED IN ~~LOC~~ HOSPITAL LAB): SARS Coronavirus 2: NEGATIVE

## 2018-06-20 LAB — CBC
HCT: 33.1 % — ABNORMAL LOW (ref 36.0–46.0)
Hemoglobin: 10.1 g/dL — ABNORMAL LOW (ref 12.0–15.0)
MCH: 24.5 pg — ABNORMAL LOW (ref 26.0–34.0)
MCHC: 30.5 g/dL (ref 30.0–36.0)
MCV: 80.3 fL (ref 80.0–100.0)
Platelets: 360 10*3/uL (ref 150–400)
RBC: 4.12 MIL/uL (ref 3.87–5.11)
RDW: 17.9 % — ABNORMAL HIGH (ref 11.5–15.5)
WBC: 9.5 10*3/uL (ref 4.0–10.5)
nRBC: 0.3 % — ABNORMAL HIGH (ref 0.0–0.2)

## 2018-06-20 LAB — TROPONIN I
Troponin I: <0.03 ng/mL (ref <0.03)
Troponin I: <0.03 ng/mL (ref <0.03)

## 2018-06-20 LAB — GLUCOSE, CAPILLARY: Glucose-Capillary: 145 mg/dL — ABNORMAL HIGH (ref 70–99)

## 2018-06-20 LAB — CREATININE, URINE, RANDOM: Creatinine, Urine: 37.44 mg/dL

## 2018-06-20 LAB — CBG MONITORING, ED
Glucose-Capillary: 68 mg/dL — ABNORMAL LOW (ref 70–99)
Glucose-Capillary: 78 mg/dL (ref 70–99)

## 2018-06-20 LAB — SODIUM, URINE, RANDOM: Sodium, Ur: 84 mmol/L

## 2018-06-20 LAB — BRAIN NATRIURETIC PEPTIDE: B Natriuretic Peptide: 170.8 pg/mL — ABNORMAL HIGH (ref 0.0–100.0)

## 2018-06-20 MED ORDER — CILOSTAZOL 100 MG PO TABS
100.0000 mg | ORAL_TABLET | Freq: Two times a day (BID) | ORAL | Status: DC
  Administered 2018-06-20 – 2018-06-25 (×10): 100 mg via ORAL
  Filled 2018-06-20 (×11): qty 1

## 2018-06-20 MED ORDER — SENNOSIDES-DOCUSATE SODIUM 8.6-50 MG PO TABS: 1.0000 | ORAL_TABLET | Freq: Every evening | ORAL | Status: DC | PRN

## 2018-06-20 MED ORDER — INDAPAMIDE 1.25 MG PO TABS
1.2500 mg | ORAL_TABLET | Freq: Every day | ORAL | Status: DC
  Administered 2018-06-21 – 2018-06-22 (×2): 1.25 mg via ORAL
  Filled 2018-06-20 (×2): qty 1

## 2018-06-20 MED ORDER — ACETAMINOPHEN 325 MG PO TABS: 650.0000 mg | ORAL_TABLET | Freq: Four times a day (QID) | ORAL | Status: DC | PRN

## 2018-06-20 MED ORDER — LABETALOL HCL 100 MG PO TABS
100.0000 mg | ORAL_TABLET | Freq: Two times a day (BID) | ORAL | Status: DC
  Administered 2018-06-20 – 2018-06-25 (×10): 100 mg via ORAL
  Filled 2018-06-20 (×10): qty 1

## 2018-06-20 MED ORDER — ASPIRIN EC 325 MG PO TBEC
325.0000 mg | DELAYED_RELEASE_TABLET | Freq: Every day | ORAL | Status: DC
  Administered 2018-06-21 – 2018-06-25 (×5): 325 mg via ORAL
  Filled 2018-06-20 (×5): qty 1

## 2018-06-20 MED ORDER — FUROSEMIDE 10 MG/ML IJ SOLN
40.0000 mg | Freq: Once | INTRAMUSCULAR | Status: AC
  Administered 2018-06-20: 17:00:00 40 mg via INTRAVENOUS
  Filled 2018-06-20: qty 4

## 2018-06-20 MED ORDER — FUROSEMIDE 10 MG/ML IJ SOLN
40.0000 mg | Freq: Once | INTRAMUSCULAR | Status: AC
  Administered 2018-06-20: 21:00:00 40 mg via INTRAVENOUS
  Filled 2018-06-20: qty 4

## 2018-06-20 MED ORDER — GABAPENTIN 100 MG PO CAPS
100.0000 mg | ORAL_CAPSULE | Freq: Two times a day (BID) | ORAL | Status: DC
  Administered 2018-06-20 – 2018-06-25 (×10): 100 mg via ORAL
  Filled 2018-06-20 (×10): qty 1

## 2018-06-20 MED ORDER — DILTIAZEM HCL ER COATED BEADS 180 MG PO CP24: 360.0000 mg | ORAL_CAPSULE | Freq: Every day | ORAL | Status: DC

## 2018-06-20 MED ORDER — SODIUM CHLORIDE 0.9% FLUSH
3.0000 mL | Freq: Two times a day (BID) | INTRAVENOUS | Status: DC
  Administered 2018-06-20 – 2018-06-24 (×9): 3 mL via INTRAVENOUS

## 2018-06-20 MED ORDER — AMLODIPINE BESYLATE 10 MG PO TABS
10.0000 mg | ORAL_TABLET | Freq: Once | ORAL | Status: AC
  Administered 2018-06-20: 23:00:00 10 mg via ORAL
  Filled 2018-06-20: qty 1

## 2018-06-20 MED ORDER — ONDANSETRON HCL 4 MG PO TABS: 4.0000 mg | ORAL_TABLET | Freq: Four times a day (QID) | ORAL | Status: DC | PRN

## 2018-06-20 MED ORDER — SODIUM CHLORIDE 0.9 % IV SOLN: 250.0000 mL | INTRAVENOUS | Status: DC | PRN

## 2018-06-20 MED ORDER — SODIUM CHLORIDE 0.9% FLUSH
3.0000 mL | INTRAVENOUS | Status: DC | PRN
  Administered 2018-06-22: 08:00:00 3 mL via INTRAVENOUS
  Filled 2018-06-20: qty 3

## 2018-06-20 MED ORDER — INSULIN ASPART 100 UNIT/ML ~~LOC~~ SOLN: 0.0000 [IU] | SUBCUTANEOUS | Status: DC

## 2018-06-20 MED ORDER — ACETAMINOPHEN 650 MG RE SUPP: 650.0000 mg | Freq: Four times a day (QID) | RECTAL | Status: DC | PRN

## 2018-06-20 MED ORDER — ATORVASTATIN CALCIUM 10 MG PO TABS
20.0000 mg | ORAL_TABLET | Freq: Every day | ORAL | Status: DC
  Administered 2018-06-20 – 2018-06-24 (×5): 20 mg via ORAL
  Filled 2018-06-20 (×5): qty 2

## 2018-06-20 MED ORDER — ONDANSETRON HCL 4 MG/2ML IJ SOLN: 4.0000 mg | Freq: Four times a day (QID) | INTRAMUSCULAR | Status: DC | PRN

## 2018-06-20 MED ORDER — ENOXAPARIN SODIUM 30 MG/0.3ML ~~LOC~~ SOLN
30.0000 mg | Freq: Every day | SUBCUTANEOUS | Status: DC
  Administered 2018-06-20 – 2018-06-24 (×5): 30 mg via SUBCUTANEOUS
  Filled 2018-06-20 (×5): qty 0.3

## 2018-06-20 MED ORDER — FUROSEMIDE 10 MG/ML IJ SOLN
60.0000 mg | Freq: Two times a day (BID) | INTRAMUSCULAR | Status: DC
  Administered 2018-06-21: 60 mg via INTRAVENOUS
  Filled 2018-06-20: qty 6

## 2018-06-20 MED ORDER — NICOTINE 21 MG/24HR TD PT24
21.0000 mg | MEDICATED_PATCH | Freq: Every day | TRANSDERMAL | Status: DC
  Filled 2018-06-20 (×5): qty 1

## 2018-06-20 MED ORDER — AMLODIPINE BESYLATE 10 MG PO TABS
10.0000 mg | ORAL_TABLET | ORAL | Status: DC
  Administered 2018-06-22 – 2018-06-24 (×2): 10 mg via ORAL
  Filled 2018-06-20 (×4): qty 1

## 2018-06-20 MED ORDER — SENNA 8.6 MG PO TABS
1.0000 | ORAL_TABLET | Freq: Two times a day (BID) | ORAL | Status: DC
  Administered 2018-06-20 – 2018-06-25 (×8): 8.6 mg via ORAL
  Filled 2018-06-20 (×10): qty 1

## 2018-06-20 MED ORDER — INSULIN GLARGINE 100 UNIT/ML ~~LOC~~ SOLN
5.0000 [IU] | Freq: Every day | SUBCUTANEOUS | Status: DC
  Administered 2018-06-21 – 2018-06-25 (×5): 5 [IU] via SUBCUTANEOUS
  Filled 2018-06-20 (×5): qty 0.05

## 2018-06-20 NOTE — ED Notes (Signed)
EDP at bedside talking to patient's daughter on the phone about patients's care

## 2018-06-20 NOTE — H&P (Signed)
Julie Brewer WUJ:[id number] DOB: Sep 11, 1950 DOA: 06/20/2018     PCP: [name], MD   Outpatient Specialists:   CARDS:  Dr. [name], MD; NEphrology:  Dr.    [name] Dr. [name] Vascular Patient arrived to ER on 06/20/18 at 1503  Patient coming from: home Lives   With family    Chief Complaint:  Chief Complaint  Patient presents with  . Shortness of Breath    HPI: Julie Brewer is a 68 y.o. female with medical history significant of DM2, HTN, asthma, GERD, HLD, PVD, anemia, CVA    Presented with leg swelling worsening shortness of breath abdominal swelling weight gain of 11 pounds history of minimal exertion.  Two-pillow orthopnea she is short of breath with minimal exertion for this time. Baseline dry weight is about 142 pounds. As of breath gets worse with exertion. Patient was evaluated by vascular surgery today secondary to leg swelling and pain right leg Dopplers were done showed no evidence of DVT Advised to call primary care for evaluation was instructed to come to emergency department.  Infectious risk factors:  Reports  shortness of breath,   In RAPID COVID TEST NEGATIVE     Regarding pertinent Chronic problems:      Hyperlipidemia - on statins   HTN on diltiazem 360 mg, indapamide 1.25 every day and losartan 25 mg daily, labetalol 100 BID Losartan 25 mg was added because of proteinuriaPreviously lisinopril was stopped because of mild hyperkalemia and worsening renal function   CHF diastolic  - last echo 95/6213 EF 65-70% not on  Lasix at baseline  Peripheral vascular disease followed by vascular surgery     DM 2 -  Lab Results  Component Value Date   HGBA1C 6.7 (A) 06/03/2018   on insulin,  Lantus insulin 16 U in the morning daily.  Regular insulin 30 minutes Before eating, 12 units a.m. and 5 ac supper  Oral hypoglycemic drugs: Metformin ER 750 MG once daily         Asthma -well controlled on home inhalers         Hx of CVA /TIA 2010 - manifested by slurred speech that resolved with out residual deficits on Aspirin 325 mg,      CKD stage III - baseline Cr 2.0 followed by nephrology That is post loop recorder followed by cardiology  While in ER: Chest x-ray showing bilateral pleural effusions and evidence of CHF patient was given 40 IV Lasix Continues to be hypoxic on 2 L.  Increased respiration rate blood pressure 180  VBG 7.307/41.5 Patient is being admitted for CHF exacerbation  The following Work up has been ordered so far:  Orders Placed This Encounter  Procedures  . SARS Coronavirus 2 (CEPHEID- Performed in [place] hospital lab), [facility]  . DG Chest 2 View  . Basic metabolic panel  . CBC  . Troponin I - ONCE - STAT  . Brain natriuretic peptide  . Blood gas, venous  . Troponin I - Now Then Q6H  . Basic metabolic panel  . Urinalysis, Routine w reflex microscopic  . Creatinine, urine, random  . Sodium, urine, random  . Diet Carb Modified Fluid consistency: Thin; Room service appropriate? Yes  . Cardiac monitoring  . Weigh patient  . Notify physician (specify)  . Activity per Heart Failure Clinical Pathway  . Initiate Heart Failure clinical pathway  . Daily weights  . Strict intake and output  . In and Out Cath  .  Patient education (specify):  Marland Kitchen STAT CBG when hypoglycemia is suspected. If treated, recheck every 15 minutes after each treatment until CBG >/= 70 mg/dl  . Refer to Hypoglycemia Protocol Sidebar Report for treatment of CBG < 70 mg/dl  . Cardiac Monitoring Continuous x 24 hours Indications for use: Sub-acute heart failure  . No ACE Inhibitor / ARB  . Beta blocker already ordered  . Cardiac monitoring  . Insert foley catheter  . Consult to hospitalist  . Consult to case management  . Droplet and Contact precautions  . Pulse oximetry, continuous  . Oxygen therapy Mode or (Route): Nasal cannula; Keep 02 saturation: >/= 92%  . CBG monitoring, ED  . POCT  I-Stat EG7  . CBG monitoring, ED  . EKG 12-Lead  . ED EKG  . ECHOCARDIOGRAM COMPLETE  . Insert peripheral IV  . Admit to Inpatient (patient's expected length of stay will be greater than 2 midnights or inpatient only procedure)     Following Medications were ordered in ER: Medications  furosemide (LASIX) injection 60 mg (has no administration in time range)  insulin aspart (novoLOG) injection 0-9 Units (has no administration in time range)  furosemide (LASIX) injection 40 mg (40 mg Intravenous Given 06/20/18 1715)  furosemide (LASIX) injection 40 mg (40 mg Intravenous Given 06/20/18 2048)        Consult Orders  (From admission, onward)         Start     Ordered   06/20/18 2142  Consult to case management  Once    Comments:  Heart failure home health screen and may place order for PT/OT eval and treat if indicated.  Provider:  (Not yet assigned)  Question:  Reason for consult:  Answer:  Other (see comments)   06/20/18 2141   06/20/18 1823  Consult to hospitalist  Once    Provider:  Toy Baker, MD  Question Answer Comment  Place call to: [facility]   Reason for Consult Admit      06/20/18 1822           Significant initial  Findings: Abnormal Labs Reviewed  BASIC METABOLIC PANEL - Abnormal; Notable for the following components:      Result Value   CO2 13 (*)    BUN 29 (*)    Creatinine, Ser 3.11 (*)    Calcium 8.7 (*)    GFR calc non Af Amer 15 (*)    GFR calc Af Amer 17 (*)    All other components within normal limits  CBC - Abnormal; Notable for the following components:   Hemoglobin 10.1 (*)    HCT 33.1 (*)    MCH 24.5 (*)    RDW 17.9 (*)    nRBC 0.3 (*)    All other components within normal limits  BRAIN NATRIURETIC PEPTIDE - Abnormal; Notable for the following components:   B Natriuretic Peptide 170.8 (*)    All other components within normal limits  URINALYSIS, ROUTINE W REFLEX MICROSCOPIC - Abnormal; Notable for the following components:    Color, Urine STRAW (*)    Protein, ur 100 (*)    Bacteria, UA RARE (*)    All other components within normal limits  CBG MONITORING, ED - Abnormal; Notable for the following components:   Glucose-Capillary 68 (*)    All other components within normal limits  POCT I-STAT EG7 - Abnormal; Notable for the following components:   pCO2, Ven 31.5 (*)    pO2, Ven 76.0 (*)  Bicarbonate 15.7 (*)    TCO2 17 (*)    Acid-base deficit 10.0 (*)    Calcium, Ion 1.12 (*)    HCT 30.0 (*)    Hemoglobin 10.2 (*)    All other components within normal limits    Otherwise labs showing:    Recent Labs  Lab 06/20/18 1533 06/20/18 1737  NA 138 138  K 4.9 5.0  CO2 13*  --   GLUCOSE 72  --   BUN 29*  --   CREATININE 3.11*  --   CALCIUM 8.7*  --     Cr  Up from baseline see below Lab Results  Component Value Date   CREATININE 3.11 (H) 06/20/2018   CREATININE 2.02 (H) 05/05/2018   CREATININE 1.82 (H) 03/11/2018    No results for input(s): AST, ALT, ALKPHOS, BILITOT, PROT, ALBUMIN in the last 168 hours. Lab Results  Component Value Date   CALCIUM 8.7 (L) 06/20/2018   PHOS 3.9 01/01/2018    WBC      Component Value Date/Time   WBC 9.5 06/20/2018 1533   ANC    Component Value Date/Time   NEUTROABS 7.4 (H) 07/17/2017 1012   NEUTROABS 7.2 (H) 01/17/2017 1043   ALC No results found for: LYMPHOABS    Plt: Lab Results  Component Value Date   PLT 360 06/20/2018    Lactic Acid, Venous No results found for: LATICACIDVEN    Lab Results  Component Value Date   FERRITIN 36 07/17/2017      VBG    Component Value Date/Time   HCO3 15.7 (L) 06/20/2018 1737   TCO2 17 (L) 06/20/2018 1737   ACIDBASEDEF 10.0 (H) 06/20/2018 1737   O2SAT 94.0 06/20/2018 1737      HG/HCT  stable,      Component Value Date/Time   HGB 10.2 (L) 06/20/2018 1737   HGB 9.3 (L) 07/17/2017 1012   HGB 10.1 (L) 01/17/2017 1043   HCT 30.0 (L) 06/20/2018 1737   HCT 31.8 (L) 01/17/2017 1043      Troponin   Cardiac Panel (last 3 results) Recent Labs    06/20/18 1533  TROPONINI <0.03     BNP (last 3 results) Recent Labs    06/20/18 1723  BNP 170.8*    ProBNP (last 3 results) No results for input(s): PROBNP in the last 8760 hours.  DM  labs:  HbA1C: Recent Labs    12/06/17 0935 03/11/18 0958 06/03/18 1348  HGBA1C 6.6* 7.7* 6.7*       CBG (last 3)  Recent Labs    06/20/18 1624 06/20/18 2047  GLUCAP 68* 78       UA  not ordered     CXR - bilateral pleural effusions, CHF    ECG:  Personally reviewed by me showing: HR : 95 Rhythm:  NSR,   nonspecific changes,   [id number]      ED Triage Vitals  Enc Vitals Group     BP 06/20/18 1520 (!) 167/70     Pulse Rate 06/20/18 1520 93     Resp 06/20/18 1520 16     Temp 06/20/18 1520 97.7 F (36.5 C)     Temp Source 06/20/18 1520 Oral     SpO2 06/20/18 1520 (!) 83 %     Weight 06/20/18 1518 153 lb (69.4 kg)     Height 06/20/18 1518 5' 3.25" (1.607 m)     Head Circumference --      Peak Flow --  Pain Score 06/20/18 1518 5     Pain Loc --      Pain Edu? --      Excl. in [place]? --   TMAX(24)@       Latest  Blood pressure (!) 184/72, pulse 96, temperature 97.7 F (36.5 C), temperature source Oral, resp. rate (!) 29, height 5' 3.25" (1.607 m), weight 69.4 kg, SpO2 (!) 89 %.    Hospitalist was called for admission for CHF exacerbation   Review of Systems:    Pertinent positives include:  fatigue, shortness of breath at rest dyspnea on exertion, Bilateral lower extremity swelling   Constitutional:  No weight loss, night sweats, Fevers, chills,weight loss  HEENT:  No headaches, Difficulty swallowing,Tooth/dental problems,Sore throat,  No sneezing, itching, ear ache, nasal congestion, post nasal drip,  Cardio-vascular:  No chest pain, Orthopnea, PND, anasarca, dizziness, palpitations.no  GI:  No heartburn, indigestion, abdominal pain, nausea, vomiting, diarrhea, change in bowel habits, loss of  appetite, melena, blood in stool, hematemesis Resp:  no , No excess mucus, no productive cough, No non-productive cough, No coughing up of blood.No change in color of mucus.No wheezing. Skin:  no rash or lesions. No jaundice GU:  no dysuria, change in color of urine, no urgency or frequency. No straining to urinate.  No flank pain.  Musculoskeletal:  No joint pain or no joint swelling. No decreased range of motion. No back pain.  Psych:  No change in mood or affect. No depression or anxiety. No memory loss.  Neuro: no localizing neurological complaints, no tingling, no weakness, no double vision, no gait abnormality, no slurred speech, no confusion  All systems reviewed and apart from [place] all are negative  Past Medical History:   Past Medical History:  Diagnosis Date  . Asthma    No probnlems recently  . 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 Diabetes mellitus    Type II per Dr [name]  (patient said type I)  . Fibroid   . GERD (gastroesophageal reflux disease)   . Glaucoma   . Hyperlipidemia   . Hypertension   . Iron deficiency anemia 03/09/2016  . Peripheral vascular disease ([place])   . Pneumonia 2006  . Shortness of breath dyspnea    with exdrtion, "Walkling too fast"  . Stroke [facility])    no residual      Past Surgical History:  Procedure Laterality Date  . ABDOMINAL HYSTERECTOMY    . BIOPSY  06/10/2017   Procedure: BIOPSY;  Surgeon: Ronnette Juniper, MD;  Location: [facility];  Service: Gastroenterology;;  . CERVICAL FUSION     with graft from hip  . COLONOSCOPY  July 09, 2012  . DIRECT LARYNGOSCOPY N/A 06/07/2014   Procedure: DIRECT LARYNGOSCOPY with BIOPSY and EXCISION VOLLECULAR CYST;  Surgeon: Ruby Cola, MD;  Location: [facility] OR;  Service: ENT;  Laterality: N/A;  . ESOPHAGOGASTRODUODENOSCOPY (EGD) WITH PROPOFOL Left 06/10/2017   Procedure: ESOPHAGOGASTRODUODENOSCOPY (EGD) WITH PROPOFOL;  Surgeon: Ronnette Juniper, MD;   Location: [facility];  Service: Gastroenterology;  Laterality: Left;  . FLEXIBLE SIGMOIDOSCOPY Left 06/10/2017   Procedure: FLEXIBLE SIGMOIDOSCOPY;  Surgeon: Ronnette Juniper, MD;  Location: [facility];  Service: Gastroenterology;  Laterality: Left;  . LOOP RECORDER INSERTION N/A 06/20/2016   Procedure: Loop Recorder Insertion;  Surgeon: [name], MD;  Location: [facility] LAB;  Service: Cardiovascular;  Laterality: N/A;  . REFRACTIVE SURGERY Bilateral    both eyes  .  SPINE SURGERY     lumbar    Social History:  Ambulatory  Kasandra Knudsen,     reports that she has been smoking cigarettes. She has a 30.00 pack-year smoking history. She has never used smokeless tobacco. She reports current alcohol use. She reports current drug use. Drug: Methylphenidate.     Family History:   Family History  Problem Relation Age of Onset  . Cancer Mother   . Heart disease Mother   . Diabetes Father   . Cancer Brother   . Cancer Brother   . Throat cancer Brother     Allergies: Allergies  Allergen Reactions  . Morphine And Related Other (See Comments)    Hallucenations   . Penicillins Rash and Other (See Comments)    Swelling     Prior to Admission medications   Medication Sig Start Date End Date Taking? Authorizing Provider  aspirin 325 MG EC tablet Take 325 mg by mouth daily.     [provider]  atorvastatin (LIPITOR) 20 MG tablet Take 1 tablet by mouth once daily 05/28/18   Elayne Snare, MD  cilostazol (PLETAL) 100 MG tablet Take 1 tablet by mouth twice daily 05/04/18   Waynetta Sandy, MD  diltiazem (CARDIZEM CD) 360 MG 24 hr capsule Take 1 capsule (360 mg total) by mouth daily. 03/14/18   Elayne Snare, MD  gabapentin (NEURONTIN) 100 MG capsule Take 1 capsule by mouth twice daily 06/15/18   Elayne Snare, MD  glucose blood (FREESTYLE LITE) test strip Use as instructed to check blood sugar 1 times a day dx code E11.65 03/14/18   Elayne Snare, MD  indapamide (LOZOL) 1.25 MG  tablet Take 1 tablet (1.25 mg total) by mouth daily. 03/14/18   Elayne Snare, MD  insulin glargine (LANTUS) 100 UNIT/ML injection Inject 0.14 mLs (14 Units total) into the skin daily. Patient taking differently: Inject 16 Units into the skin daily. INJECT 16 UNITS UNDER THE SKIN ONCE DAILY. 10/10/17   Elayne Snare, MD  insulin regular (NOVOLIN R RELION) 100 units/mL injection Inject into the skin 2 (two) times daily before a meal. INJECT 10 UNITS UNDER THE SKIN AT BREAKFAST, AND 5 UNITS IN THE EVENING.    [provider]  IRON PO Take 1 tablet by mouth daily.     [provider]  labetalol (NORMODYNE) 100 MG tablet Take 1 tablet (100 mg total) by mouth 2 (two) times daily. 05/05/18   Elayne Snare, MD  losartan (COZAAR) 25 MG tablet Take 1 tablet by mouth once daily 05/01/18   Elayne Snare, MD  metFORMIN (GLUCOPHAGE-XR) 750 MG 24 hr tablet Take 1 tablet by mouth once daily 05/01/18   Elayne Snare, MD  omeprazole (PRILOSEC) 20 MG capsule TAKE 1 CAPSULE BY MOUTH ONCE DAILY (DUE  FOR  OFFICE  VISIT  THIS  MONTH) 05/28/18   Elayne Snare, MD  Vitamin D, Ergocalciferol, (DRISDOL) 1.25 MG (50000 UT) CAPS capsule TAKE ONE CAPSULE BY MOUTH EVERY 7 DAYS 01/30/18   Elayne Snare, MD   Physical Exam: Blood pressure (!) 184/72, pulse 96, temperature 97.7 F (36.5 C), temperature source Oral, resp. rate (!) 29, height 5' 3.25" (1.607 m), weight 69.4 kg, SpO2 (!) 89 %. 1. General:  in No  Acute distress   Chronically ill   -appearing 2. Psychological: Alert and   Oriented 3. Head/ENT:   Moist  Mucous Membranes  Head Non traumatic, neck supple                          Poor Dentition 4. SKIN: normal Skin turgor,  Skin clean Dry and intact no rash 5. Heart: Regular rate and rhythm no  Murmur, no Rub or gallop 6. Lungs:   no wheezes some  crackles   7. Abdomen: Soft,  non-tender,  distended   obese  bowel sounds present 8. Lower extremities: no clubbing, cyanosis, no  edema 9.  Neurologically Grossly intact, moving all 4 extremities equally   10. MSK: Normal range of motion   All other LABS:     Recent Labs  Lab 06/20/18 1533 06/20/18 1737  WBC 9.5  --   HGB 10.1* 10.2*  HCT 33.1* 30.0*  MCV 80.3  --   PLT 360  --      Recent Labs  Lab 06/20/18 1533 06/20/18 1737  NA 138 138  K 4.9 5.0  CL 110  --   CO2 13*  --   GLUCOSE 72  --   BUN 29*  --   CREATININE 3.11*  --   CALCIUM 8.7*  --      No results for input(s): AST, ALT, ALKPHOS, BILITOT, PROT, ALBUMIN in the last 168 hours.     Cultures:    Component Value Date/Time   SDES BLOOD LEFT ANTECUBITAL 06/08/2017 1848   SPECREQUEST  06/08/2017 1848    BOTTLES DRAWN AEROBIC AND ANAEROBIC Blood Culture adequate volume   CULT  06/08/2017 1848    NO GROWTH 5 DAYS Performed at [facility], [place] [address], [place], [place] [address]    REPTSTATUS 06/13/2017 FINAL 06/08/2017 1848     Radiological Exams on Admission: Dg Chest 2 View  Result Date: 06/20/2018 CLINICAL DATA:  Short of breath EXAM: CHEST - 2 VIEW COMPARISON:  06/08/2017 FINDINGS: Interval development of prominent interstitial markings bilaterally compatible with edema. Bilateral pleural effusions have developed since the prior study with bibasilar atelectasis. Cardiac loop recorder noted. IMPRESSION: Interval development of congestive heart failure with interstitial edema and bilateral pleural effusions. Electronically Signed   By: Franchot Gallo M.D.   On: 06/20/2018 16:36   Vas [place] Lower Extremity Venous (dvt)  Addendum Date: 06/19/2018   Examination was performed at [place] on [facility] by Ellen Henri RVT. Delorise Shiner Electronically Amended 06/19/2018, 2:01 PM   Final Loreli Dollar)    Result Date: 06/19/2018  Lower Venous Study Indications: Edema, Swelling, and Pain.  Examination Guidelines: A complete evaluation includes B-mode imaging, spectral Doppler, color Doppler, and power Doppler as needed of  all accessible portions of each vessel. Bilateral testing is considered an integral part of a complete examination. Limited examinations for reoccurring indications may be performed as noted.  +---------+---------------+---------+-----------+----------+-------+ RIGHT    CompressibilityPhasicitySpontaneityPropertiesSummary +---------+---------------+---------+-----------+----------+-------+ CFV      Full           Yes      Yes                          +---------+---------------+---------+-----------+----------+-------+ SFJ      Full                    Yes                          +---------+---------------+---------+-----------+----------+-------+ FV Prox  Full  Yes                          +---------+---------------+---------+-----------+----------+-------+ FV Mid   Full                    Yes                          +---------+---------------+---------+-----------+----------+-------+ FV DistalFull                    Yes                          +---------+---------------+---------+-----------+----------+-------+ POP      Full                    Yes                          +---------+---------------+---------+-----------+----------+-------+ PTV      Full                    Yes                          +---------+---------------+---------+-----------+----------+-------+ PERO     Full                    Yes                          +---------+---------------+---------+-----------+----------+-------+ GSV      Full                    Yes                          +---------+---------------+---------+-----------+----------+-------+ SSV      Full                    Yes                          +---------+---------------+---------+-----------+----------+-------+   +----+---------------+---------+-----------+----------+-------+ LEFTCompressibilityPhasicitySpontaneityPropertiesSummary  +----+---------------+---------+-----------+----------+-------+ CFV Full           Yes      Yes                          +----+---------------+---------+-----------+----------+-------+     Summary: Right: There is no evidence of deep vein thrombosis in the lower extremity. There is no evidence of superficial venous thrombosis. No cystic structure found in the popliteal fossa. Common femoral vein Doppler signals were pulsatile bilaterally. Left: No evidence of common femoral vein obstruction.  *See table(s) above for measurements and observations. Electronically signed by Ruta Hinds MD on 06/19/2018 at 11:29:15 AM.    Final (Amended)     Chart has been reviewed    Assessment/Plan   68 y.o. female with medical history significant of DM2, HTN, asthma, GERD, HLD, PVD, anemia, CVA  Admitted for CHF exacebation  Present on Admission: Acute on chronic CHF exacerbation -  - admit on telemetry,  cycle cardiac enzymes, Troponin <0.03   obtain serial ECG  to evaluate for ischemia as a cause of heart failure  monitor daily weight:  Filed Weights   06/20/18 1518  Weight:  69.4 kg   Last BNP BNP (last 3 results) Recent Labs    06/20/18 1723  BNP 170.8*    ProBNP (last 3 results) No results for input(s): PROBNP in the last 8760 hours.   diurese with IV lasix and monitor orthostatics and creatinine to avoid over diuresis.  Order echogram to evaluate EF and valves  ACE/ARBi  Contraindicated worsening renal function   cardiology consult in a.m.   Marland Kitchen Peripheral vascular disease ([place]) -chronic currently stable . Dyslipidemia -chronic and currently stable . Essential hypertension -accelerated we will continue home medications try hydralazine as needed . GERD (gastroesophageal reflux disease) stable continue home medications . AKI (acute kidney injury) ([place]) acute on chronic worsening avoid nephrotoxic medications could be in the setting of CHF we will continue to follow renal status  while being diuresed.  Probably would benefit from nephrology input and then close follow-up  Tobacco abuse -ongoing patient at this point not interested in quitting   DM2 -  - Order Sensitive  SSI   - continue home insulin will decrease given hypoglycemia  Decrease  Lantus 5 units,  -  check TSH and HgA1C  - Hold by mouth medications  Abdominal  distention will obtain KUB to evaluate for stool burden Other plan as per orders.  Order bowel regimen  DVT prophylaxis:  Lovenox     Code Status:    DNR/DNI   as per patient   I had personally discussed CODE STATUS with patient  Family Communication:   Family not at  Bedside    Disposition Plan:       To home once workup is complete and patient is stable                   Would benefit from PT/OT eval prior to DC  Ordered                                      Consults called: please let cardiology know in AM  Admission status:  ED Disposition    ED Disposition Condition [place]: [place] [[id number]]  Level of Care: Progressive [102]  Covid Evaluation: N/A  Diagnosis: Acute exacerbation of CHF (congestive heart failure) [facility]) [[id number]]  Admitting Physician: Toy Baker [[id number]]  Attending Physician: Toy Baker [[id number]]  Estimated length of stay: 3 - 4 days  Certification:: I certify this patient will need inpatient services for at least 2 midnights  PT Class (Do Not Modify): Inpatient [101]  PT Acc Code (Do Not Modify): Private [1]         inpatient     Expect 2 midnight stay secondary to severity of patient's current illness including   hemodynamic instability despite optimal treatment (tachycardia  hypoxia, Accelerated HTN )  Severe lab/radiological/exam abnormalities including:  CHF bilateral pleural effusions and AKI   and extensive comorbidities including: DM2    CHF  COPD   CKD  That are currently affecting medical management.   I expect  patient to be  hospitalized for 2 midnights requiring inpatient medical care.  Patient is at high risk for adverse outcome (such as loss of life or disability) if not treated.  Indication for inpatient stay as follows:    Hemodynamic instability despite maximal medical therapy,            New or worsening hypoxia  Need for  IV antihypertensives, IV diuretics     Level of care        SDU tele indefinitely please discontinue once patient no longer qualifies  Precautions: NONe  PPE: Used by the provider:   P100  eye Goggles,  Gloves     Sejal Cofield 06/20/2018, 10:02 PM    [facility]     after 2 AM please page floor coverage PA If 7AM-7PM, please contact the day team taking care of the patient using [email or web address]

## 2018-06-20 NOTE — ED Notes (Signed)
Patient arrived from X-ray

## 2018-06-20 NOTE — ED Triage Notes (Signed)
Pt here for shortness of breath while at rest and with exertion x 3 days. Endorses swelling in legs, feet, and abdomen, dry cough. Pt visually impaired.

## 2018-06-20 NOTE — ED Notes (Signed)
ED Provider at bedside. 

## 2018-06-20 NOTE — ED Notes (Signed)
Pt 83% on room air. Placed on 2L with improvement to 90%

## 2018-06-20 NOTE — Progress Notes (Signed)
Pt arrived to 4E18 from ED. CHG bath completed, tele applied and verified x2. VSS, pt hypertensive w/ SBP in 200s, MD aware and currently placing ordered for her nighttime meds. Pt denies chest pain. Slight SOB, moreso when flat. Sats in low 90s at 5L. Order for Foley noted, however pt has peed approx.458mL since arrival to unit w/ no signs of retention, holding off on Foley for now and MD is aware. Pt updated on plan of care. Oriented to room and call light. Will continue to monitor.  Jaymes Graff, RN

## 2018-06-20 NOTE — ED Provider Notes (Signed)
  Face-to-face evaluation   History: For evaluation of shortness of breath and legs swelling and orthopnea.  She was evaluated by her PCP who referred her to the ED.  Physical exam: Elderly, obese, somewhat uncomfortable.  Hypoxic on arrival.  Lungs with decreased air flow bilaterally and rales present.  No JVD.  Lucid, no dysarthria or aphasia.  Medical screening examination/treatment/procedure(s) were conducted as a shared visit with non-physician practitioner(s) and myself.  I personally evaluated the patient during the encounter    Daleen Bo, MD 06/21/18 [phone]

## 2018-06-20 NOTE — ED Provider Notes (Signed)
[facility] EMERGENCY DEPARTMENT Provider Note   CSN: [id number] Arrival date & time: 06/20/18  1503    History   Chief Complaint Chief Complaint  Patient presents with   Shortness of Breath    HPI    Julie Brewer is a 68 y.o. female with a PMHx of asthma, DM2, GERD, HTN, HLD, anemia, PVD, stroke, dyspnea, dCHF (EF 65-70% in 05/2017), and other conditions listed below, who presents to the ED with complaints of shortness of breath for last 2 to 3 days.  Patient reports that she has been feeling short of breath at rest and worsens with exertion for the last few days.  She is also noticed swelling in her abdomen and her legs.  She is also had a dry cough.  She states that yesterday she had some right calf pain so she went to her vascular surgeon and had a DVT study which was negative.  Today that right calf pain is gone but she continued to feel short of breath so her PCP Dr. [name] recommended that she be seen here for labs and chest x-ray.  She has reported some upper abdominal discomfort as well.  She reports 2 pillow orthopnea which is about the same as her usual.  She also reports having an 11 pound weight gain, states that her usual weight is 142 and that today it was 153.  She has not tried anything for her symptoms.  Exertion aggravates her shortness of breath.  She has a remote history of DVT in 1978 in both legs which was related to OCP use, she is not on any blood thinners at this time.  She denies any recent travel or sick contacts, no known COVID-19 exposures.  She also denies any recent surgery or prolonged immobilization.  Additionally she denies having any URI symptoms, fevers, chills, chest pain, nausea, vomiting, diarrhea, constipation, dysuria, hematuria, numbness, tingling, focal weakness, wheezing, claudication, lightheadedness, diaphoresis, or any other complaints at this time.  Her cardiologist is Dr. Gwenlyn Found. She has no home oxygen requirement at  baseline.  She does not believe she's on lasix, chart review does not reveal this medication on her list.   The history is provided by the patient and medical records. No language interpreter was used.  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   Diabetes mellitus    Type II per Dr [name]  (patient said type I)   Fibroid    GERD (gastroesophageal reflux disease)    Glaucoma    Hyperlipidemia    Hypertension    Iron deficiency anemia 03/09/2016   Peripheral vascular disease ([place])    Pneumonia 2006   Shortness of breath dyspnea    with exdrtion, "Walkling too fast"   Stroke [facility])    no residual    Patient Active Problem List   Diagnosis Date Noted   GERD (gastroesophageal reflux disease) 06/08/2017   Guaiac positive stools 06/08/2017   CRI (chronic renal insufficiency), stage 3 (moderate) ([place]) 05/01/2016   History of TIA (transient ischemic attack) 05/01/2016   Blind 05/01/2016   Iron deficiency anemia 03/09/2016   Syncope and collapse  01/16/2016   Dysphagia, pharyngoesophageal phase 05/05/2014   Insulin dependent diabetes mellitus with complications ([place]) 81/82/9937   Essential hypertension 10/06/2012   Dyslipidemia 09/22/2012   Peripheral vascular disease ([place]) 09/01/2012    Past Surgical History:  Procedure Laterality Date   ABDOMINAL HYSTERECTOMY     BIOPSY  06/10/2017   Procedure: BIOPSY;  Surgeon: Ronnette Juniper, MD;  Location: [facility] ENDOSCOPY;  Service: Gastroenterology;;   CERVICAL FUSION     with graft from hip   COLONOSCOPY  July 09, 2012   DIRECT LARYNGOSCOPY N/A 06/07/2014   Procedure: DIRECT LARYNGOSCOPY with BIOPSY and EXCISION VOLLECULAR  CYST;  Surgeon: Ruby Cola, MD;  Location: [facility] OR;  Service: ENT;  Laterality: N/A;   ESOPHAGOGASTRODUODENOSCOPY (EGD) WITH PROPOFOL Left 06/10/2017   Procedure: ESOPHAGOGASTRODUODENOSCOPY (EGD) WITH PROPOFOL;  Surgeon: Ronnette Juniper, MD;  Location: [place];  Service: Gastroenterology;  Laterality: Left;   FLEXIBLE SIGMOIDOSCOPY Left 06/10/2017   Procedure: FLEXIBLE SIGMOIDOSCOPY;  Surgeon: Ronnette Juniper, MD;  Location: [place];  Service: Gastroenterology;  Laterality: Left;   LOOP RECORDER INSERTION N/A 06/20/2016   Procedure: Loop Recorder Insertion;  Surgeon: [name], MD;  Location: [place] CV LAB;  Service: Cardiovascular;  Laterality: N/A;   REFRACTIVE SURGERY Bilateral    both eyes   SPINE SURGERY     lumbar     OB History    Gravida  1   Para  1   Term  0   Preterm  0   AB  0   Living  1     SAB  0   TAB  0   Ectopic  0   Multiple  0   Live Births  0            Home Medications    Prior to Admission medications   Medication Sig Start Date End Date Taking? Authorizing Provider  aspirin 325 MG EC tablet Take 325 mg by mouth daily.     [provider]  atorvastatin (LIPITOR) 20 MG tablet Take 1 tablet by mouth once daily 05/28/18   Elayne Snare, MD  cilostazol (PLETAL) 100 MG tablet Take 1 tablet by mouth twice daily 05/04/18   Waynetta Sandy, MD  diltiazem (CARDIZEM CD) 360 MG 24 hr capsule Take 1 capsule (360 mg total) by mouth daily. 03/14/18   Elayne Snare, MD  gabapentin (NEURONTIN) 100 MG capsule Take 1 capsule by mouth twice daily 06/15/18   Elayne Snare, MD  glucose blood (FREESTYLE LITE) test strip Use as instructed to check blood sugar 1 times a day dx code E11.65 03/14/18   Elayne Snare, MD  indapamide (LOZOL) 1.25 MG tablet Take 1 tablet (1.25 mg total) by mouth daily. 03/14/18   Elayne Snare, MD  insulin glargine (LANTUS) 100 UNIT/ML injection Inject 0.14 mLs (14 Units total) into the skin daily. Patient taking  differently: Inject 16 Units into the skin daily. INJECT 16 UNITS UNDER THE SKIN ONCE DAILY. 10/10/17   Elayne Snare, MD  insulin regular (NOVOLIN R RELION) 100 units/mL injection Inject into the skin 2 (two) times daily before a meal. INJECT 10 UNITS UNDER THE SKIN AT BREAKFAST, AND 5 UNITS IN THE EVENING.    [provider]  IRON PO Take 1 tablet by mouth daily.     [provider]  labetalol (NORMODYNE) 100 MG tablet Take 1 tablet (100 mg total) by mouth 2 (two) times daily. 05/05/18   Elayne Snare, MD  losartan (COZAAR) 25 MG tablet  Take 1 tablet by mouth once daily 05/01/18   Elayne Snare, MD  metFORMIN (GLUCOPHAGE-XR) 750 MG 24 hr tablet Take 1 tablet by mouth once daily 05/01/18   Elayne Snare, MD  omeprazole (PRILOSEC) 20 MG capsule TAKE 1 CAPSULE BY MOUTH ONCE DAILY (DUE  FOR  OFFICE  VISIT  THIS  MONTH) 05/28/18   Elayne Snare, MD  Vitamin D, Ergocalciferol, (DRISDOL) 1.25 MG (50000 UT) CAPS capsule TAKE ONE CAPSULE BY MOUTH EVERY 7 DAYS 01/30/18   Elayne Snare, MD    Family History Family History  Problem Relation Age of Onset   Cancer Mother    Heart disease Mother    Diabetes Father    Cancer Brother    Cancer Brother    Throat cancer Brother     Social History Social History   Tobacco Use   Smoking status: Current Every Day Smoker    Packs/day: 1.00    Years: 30.00    Pack years: 30.00    Types: Cigarettes   Smokeless tobacco: Never Used  Substance Use Topics   Alcohol use: Yes    Comment: socially   Drug use: Yes    Types: Methylphenidate     Allergies   Morphine and related and Penicillins   Review of Systems Review of Systems  Constitutional: Positive for unexpected weight change. Negative for chills, diaphoresis and fever.  HENT: Negative for rhinorrhea and sore throat.   Respiratory: Positive for cough and shortness of breath. Negative for wheezing.        +orthopnea  Cardiovascular: Positive for leg swelling. Negative for chest pain.    Gastrointestinal: Positive for abdominal pain. Negative for constipation, diarrhea, nausea and vomiting.  Genitourinary: Negative for dysuria and hematuria.  Musculoskeletal: Negative for arthralgias and myalgias.  Skin: Negative for color change.  Allergic/Immunologic: Negative for immunocompromised state (DM2).  Neurological: Negative for weakness, light-headedness and numbness.  Psychiatric/Behavioral: Negative for confusion.   All other systems reviewed and are negative for acute change except as noted in the HPI.    Physical Exam Updated Vital Signs BP (!) 167/70 (BP Location: Right Arm)    Pulse 93    Temp 97.7 F (36.5 C) (Oral)    Resp 16    Ht 5' 3.25" (1.607 m)    Wt 69.4 kg    SpO2 90%    BMI 26.89 kg/m   Physical Exam Vitals signs and nursing note reviewed.  Constitutional:      General: She is not in acute distress.    Appearance: Normal appearance. She is well-developed. She is not toxic-appearing.     Comments: Afebrile, nontoxic, NAD  HENT:     Head: Normocephalic and atraumatic.  Eyes:     General:        Right eye: No discharge.        Left eye: No discharge.     Conjunctiva/sclera: Conjunctivae normal.  Neck:     Musculoskeletal: Normal range of motion and neck supple.  Cardiovascular:     Rate and Rhythm: Normal rate and regular rhythm.     Pulses: Normal pulses.     Heart sounds: Normal heart sounds, S1 normal and S2 normal. No murmur. No friction rub. No gallop.      Comments: RRR, nl s1/s2, no m/r/g, distal pulses intact, 2+ b/l pedal edema up to the knees at least, neg homan's sign bilaterally Pulmonary:     Effort: Pulmonary effort is normal. No respiratory distress.  Breath sounds: Decreased breath sounds present. No wheezing, rhonchi or rales.     Comments: Diminished lung sounds globally but most notably in the lower fields bilaterally, difficult to appreciate any rales but none heard, no wheezing or rhonchi appreciated, speaking in fragmented  sentences but otherwise no significantly increased WOB, SpO2 83% on RA but up to 91% on 2.5L via [place].  Abdominal:     General: Bowel sounds are normal. There is no distension.     Palpations: Abdomen is soft. Abdomen is not rigid.     Tenderness: There is generalized abdominal tenderness. There is no right CVA tenderness, left CVA tenderness, guarding or rebound. Negative signs include Murphy's sign and McBurney's sign.     Comments: Soft, without obvious distension, +BS throughout, with very mild generalized abd TTP/discomfort, no r/g/r, neg murphy's, neg mcburney's, no CVA TTP   Musculoskeletal: Normal range of motion.     Right lower leg: 2+ Pitting Edema present.     Left lower leg: 2+ Pitting Edema present.  Skin:    General: Skin is warm and dry.     Findings: No rash.  Neurological:     Mental Status: She is alert and oriented to person, place, and time.     Sensory: Sensation is intact. No sensory deficit.     Motor: Motor function is intact.  Psychiatric:        Mood and Affect: Mood and affect normal.        Behavior: Behavior normal.      ED Treatments / Results  Labs (all labs ordered are listed, but only abnormal results are displayed) Labs Reviewed  BASIC METABOLIC PANEL - Abnormal; Notable for the following components:      Result Value   CO2 13 (*)    BUN 29 (*)    Creatinine, Ser 3.11 (*)    Calcium 8.7 (*)    GFR calc non Af Amer 15 (*)    GFR calc Af Amer 17 (*)    All other components within normal limits  CBC - Abnormal; Notable for the following components:   Hemoglobin 10.1 (*)    HCT 33.1 (*)    MCH 24.5 (*)    RDW 17.9 (*)    nRBC 0.3 (*)    All other components within normal limits  BRAIN NATRIURETIC PEPTIDE - Abnormal; Notable for the following components:   B Natriuretic Peptide 170.8 (*)    All other components within normal limits  CBG MONITORING, ED - Abnormal; Notable for the following components:   Glucose-Capillary 68 (*)    All other  components within normal limits  POCT I-STAT EG7 - Abnormal; Notable for the following components:   pCO2, Ven 31.5 (*)    pO2, Ven 76.0 (*)    Bicarbonate 15.7 (*)    TCO2 17 (*)    Acid-base deficit 10.0 (*)    Calcium, Ion 1.12 (*)    HCT 30.0 (*)    Hemoglobin 10.2 (*)    All other components within normal limits  SARS CORONAVIRUS 2 (HOSPITAL ORDER, [place] [facility])  TROPONIN I  BLOOD GAS, VENOUS    EKG EKG Interpretation  Date/Time:  Friday Jun 20 2018 15:15:05 EDT Ventricular Rate:  95 PR Interval:  142 QRS Duration: 70 QT Interval:  358 QTC Calculation: 449 R Axis:   -21 Text Interpretation:  Normal sinus rhythm Low voltage QRS Septal infarct , age undetermined ST & T wave abnormality, consider  lateral ischemia Abnormal ECG Since last tracing lateral ST abnormality is new Confirmed by Daleen Bo [phone]) on 06/20/2018 4:55:44 PM   Radiology Dg Chest 2 View  Result Date: 06/20/2018 CLINICAL DATA:  Short of breath EXAM: CHEST - 2 VIEW COMPARISON:  06/08/2017 FINDINGS: Interval development of prominent interstitial markings bilaterally compatible with edema. Bilateral pleural effusions have developed since the prior study with bibasilar atelectasis. Cardiac loop recorder noted. IMPRESSION: Interval development of congestive heart failure with interstitial edema and bilateral pleural effusions. Electronically Signed   By: Franchot Gallo M.D.   On: 06/20/2018 16:36   Vas [place] Lower Extremity Venous (dvt)  Addendum Date: 06/19/2018   Examination was performed at [place] [facility] by Ellen Henri RVT. Delorise Shiner Electronically Amended 06/19/2018, 2:01 PM   Final Loreli Dollar)    Result Date: 06/19/2018  Lower Venous Study Indications: Edema, Swelling, and Pain.  Examination Guidelines: A complete evaluation includes B-mode imaging, spectral Doppler, color Doppler, and power Doppler as needed of all accessible portions of each  vessel. Bilateral testing is considered an integral part of a complete examination. Limited examinations for reoccurring indications may be performed as noted.  +---------+---------------+---------+-----------+----------+-------+  RIGHT     Compressibility Phasicity Spontaneity Properties Summary  +---------+---------------+---------+-----------+----------+-------+  CFV       Full            Yes       Yes                             +---------+---------------+---------+-----------+----------+-------+  SFJ       Full                      Yes                             +---------+---------------+---------+-----------+----------+-------+  FV Prox   Full                      Yes                             +---------+---------------+---------+-----------+----------+-------+  FV Mid    Full                      Yes                             +---------+---------------+---------+-----------+----------+-------+  FV Distal Full                      Yes                             +---------+---------------+---------+-----------+----------+-------+  POP       Full                      Yes                             +---------+---------------+---------+-----------+----------+-------+  PTV       Full                      Yes                             +---------+---------------+---------+-----------+----------+-------+  PERO      Full                      Yes                             +---------+---------------+---------+-----------+----------+-------+  GSV       Full                      Yes                             +---------+---------------+---------+-----------+----------+-------+  SSV       Full                      Yes                             +---------+---------------+---------+-----------+----------+-------+   +----+---------------+---------+-----------+----------+-------+  LEFT Compressibility Phasicity Spontaneity Properties Summary  +----+---------------+---------+-----------+----------+-------+  CFV   Full            Yes       Yes                             +----+---------------+---------+-----------+----------+-------+     Summary: Right: There is no evidence of deep vein thrombosis in the lower extremity. There is no evidence of superficial venous thrombosis. No cystic structure found in the popliteal fossa. Common femoral vein Doppler signals were pulsatile bilaterally. Left: No evidence of common femoral vein obstruction.  *See table(s) above for measurements and observations. Electronically signed by Ruta Hinds MD on 06/19/2018 at 11:29:15 AM.    Final (Amended)     Echo 06/09/17: Study Conclusions - Left ventricle: The cavity size was normal. Systolic function was   vigorous. The estimated ejection fraction was in the range of 65%   to 70%. Wall motion was normal; there were no regional wall   motion abnormalities. Doppler parameters are consistent with   abnormal left ventricular relaxation (grade 1 diastolic   dysfunction). There was no evidence of elevated ventricular   filling pressure by Doppler parameters. - Aortic valve: There was no regurgitation. - Aortic root: The aortic root was normal in size. - Mitral valve: There was trivial regurgitation. - Right ventricle: The cavity size was mildly dilated. Wall   thickness was normal. Systolic pressure was within the normal   range. - Right atrium: The atrium was normal in size. - Tricuspid valve: There was mild regurgitation. - Pulmonic valve: There was no regurgitation. - Inferior vena cava: The vessel was normal in size. - Pericardium, extracardiac: A trivial pericardial effusion was   identified posterior to the heart. Features were not consistent   with tamponade physiology.    Procedures Procedures (including critical care time)  Medications Ordered in ED Medications  furosemide (LASIX) injection 40 mg (40 mg Intravenous Given 06/20/18 1715)     Initial Impression / Assessment and Plan / ED Course  I have  reviewed the triage vital signs and the nursing notes.  Pertinent labs & imaging results that were available during my care of the patient were reviewed by me and considered in my medical decision making (see chart for details).        68 y.o. female here with SOB x2-3 days  with dry cough and swelling to abdomen and legs. Was seen by vascular surgery yesterday and had a negative DVT U/S there. Found to be hypoxic here, no prior oxygen requirement. On exam, lung sounds diminished globally and particularly in the lower fields, no appreciable rales but difficult to assess due to diminished lung sounds, no wheezing or rhonchi, SpO2 83% on RA up to 91% on 2.5L via [place], speaking in fragmented sentences but no significantly increased WOB otherwise, very mild abdominal tenderness without definite distension, 2+ b/l pedal edema up to the knees, no calf tenderness. Suspect CHF, pt reports 11lb wt gain and orthopnea. Doubt need for bipap at this time. Labs showing gluc 68 (sprite given while in room), BMP with Cr 3.11 up from prior, BUN 29, bicarb 13, no anion gap; CBC with chronic stable anemia; trop neg; EKG with some lateral ST changes which are new. CXR pending. Will get BNP and VBG, likely will need gentle diuresis for suspected CHF exacerbation. Will get COVID testing as well. Will reassess.   5:01 PM CXR showing interval development of CHF with interstitial edema and b/l pleural effusions. Will give lasix. Awaiting BNP and VBG. Will reassess shortly but anticipate admission for this. Discussed case with my attending Dr. Eulis Foster who agrees with plan.   6:23 PM BNP 170.8. VBG with low bicarb but pH WNL and otherwise fairly reassuring. COVID19 test pending. Will proceed with admission for suspected CHF exacerbation with new oxygen requirement.   6:42 PM Dr. Roel Cluck of [facility] returning page and will admit. Holding orders to be placed by admitting team. Please see their notes for further documentation of care. I  appreciate their help with this pleasant pt's care. Pt stable at time of admission.    Final Clinical Impressions(s) / ED Diagnoses   Final diagnoses:  Acute congestive heart failure, unspecified heart failure type ([place])  Hypoxia  Shortness of breath  Chronic anemia  AKI (acute kidney injury) [facility])    ED Discharge Orders    [address], [place], [place] 06/20/18 1843    Daleen Bo, MD 06/21/18 [phone]

## 2018-06-21 ENCOUNTER — Inpatient Hospital Stay (HOSPITAL_COMMUNITY): Payer: Medicare Other

## 2018-06-21 DIAGNOSIS — I5031 Acute diastolic (congestive) heart failure: Secondary | ICD-10-CM

## 2018-06-21 LAB — CBC
HCT: 26 % — ABNORMAL LOW (ref 36.0–46.0)
Hemoglobin: 8.3 g/dL — ABNORMAL LOW (ref 12.0–15.0)
MCH: 24.5 pg — ABNORMAL LOW (ref 26.0–34.0)
MCHC: 31.9 g/dL (ref 30.0–36.0)
MCV: 76.7 fL — ABNORMAL LOW (ref 80.0–100.0)
Platelets: 326 10*3/uL (ref 150–400)
RBC: 3.39 MIL/uL — ABNORMAL LOW (ref 3.87–5.11)
RDW: 17 % — ABNORMAL HIGH (ref 11.5–15.5)
WBC: 9.1 10*3/uL (ref 4.0–10.5)
nRBC: 0.2 % (ref 0.0–0.2)

## 2018-06-21 LAB — ECHOCARDIOGRAM COMPLETE
Height: 63.25 in
Weight: 2536.17 oz

## 2018-06-21 LAB — COMPREHENSIVE METABOLIC PANEL
ALT: 9 U/L (ref 0–44)
AST: 11 U/L — ABNORMAL LOW (ref 15–41)
Albumin: 2.4 g/dL — ABNORMAL LOW (ref 3.5–5.0)
Alkaline Phosphatase: 82 U/L (ref 38–126)
Anion gap: 13 (ref 5–15)
BUN: 30 mg/dL — ABNORMAL HIGH (ref 8–23)
CO2: 17 mmol/L — ABNORMAL LOW (ref 22–32)
Calcium: 8.4 mg/dL — ABNORMAL LOW (ref 8.9–10.3)
Chloride: 109 mmol/L (ref 98–111)
Creatinine, Ser: 3.12 mg/dL — ABNORMAL HIGH (ref 0.44–1.00)
GFR calc Af Amer: 17 mL/min — ABNORMAL LOW (ref >60)
GFR calc non Af Amer: 15 mL/min — ABNORMAL LOW (ref >60)
Glucose, Bld: 68 mg/dL — ABNORMAL LOW (ref 70–99)
Potassium: 4.9 mmol/L (ref 3.5–5.1)
Sodium: 139 mmol/L (ref 135–145)
Total Bilirubin: 0.4 mg/dL (ref 0.3–1.2)
Total Protein: 5.6 g/dL — ABNORMAL LOW (ref 6.5–8.1)

## 2018-06-21 LAB — GLUCOSE, CAPILLARY
Glucose-Capillary: 65 mg/dL — ABNORMAL LOW (ref 70–99)
Glucose-Capillary: 125 mg/dL — ABNORMAL HIGH (ref 70–99)
Glucose-Capillary: 64 mg/dL — ABNORMAL LOW (ref 70–99)
Glucose-Capillary: 151 mg/dL — ABNORMAL HIGH (ref 70–99)
Glucose-Capillary: 128 mg/dL — ABNORMAL HIGH (ref 70–99)
Glucose-Capillary: 316 mg/dL — ABNORMAL HIGH (ref 70–99)

## 2018-06-21 LAB — MAGNESIUM: Magnesium: 1.7 mg/dL (ref 1.7–2.4)

## 2018-06-21 LAB — TROPONIN I
Troponin I: <0.03 ng/mL (ref <0.03)
Troponin I: <0.03 ng/mL (ref <0.03)

## 2018-06-21 LAB — PHOSPHORUS: Phosphorus: 4.7 mg/dL — ABNORMAL HIGH (ref 2.5–4.6)

## 2018-06-21 LAB — TSH: TSH: 2.116 u[IU]/mL (ref 0.350–4.500)

## 2018-06-21 LAB — HIV ANTIBODY (ROUTINE TESTING W REFLEX): HIV Screen 4th Generation wRfx: NONREACTIVE

## 2018-06-21 LAB — BRAIN NATRIURETIC PEPTIDE: B Natriuretic Peptide: 93.4 pg/mL (ref 0.0–100.0)

## 2018-06-21 MED ORDER — POLYETHYLENE GLYCOL 3350 17 G PO PACK: 17.0000 g | PACK | Freq: Every day | ORAL | Status: DC | PRN

## 2018-06-21 MED ORDER — INSULIN ASPART 100 UNIT/ML ~~LOC~~ SOLN
0.0000 [IU] | Freq: Three times a day (TID) | SUBCUTANEOUS | Status: DC
  Administered 2018-06-21: 7 [IU] via SUBCUTANEOUS
  Administered 2018-06-22: 5 [IU] via SUBCUTANEOUS
  Administered 2018-06-22 (×2): 2 [IU] via SUBCUTANEOUS
  Administered 2018-06-23 (×3): 5 [IU] via SUBCUTANEOUS
  Administered 2018-06-24: 3 [IU] via SUBCUTANEOUS
  Administered 2018-06-24: 5 [IU] via SUBCUTANEOUS
  Administered 2018-06-24: 3 [IU] via SUBCUTANEOUS
  Administered 2018-06-25: 1 [IU] via SUBCUTANEOUS

## 2018-06-21 MED ORDER — PANTOPRAZOLE SODIUM 40 MG PO TBEC
40.0000 mg | DELAYED_RELEASE_TABLET | Freq: Every day | ORAL | Status: DC
  Administered 2018-06-21 – 2018-06-25 (×5): 40 mg via ORAL
  Filled 2018-06-21 (×4): qty 1

## 2018-06-21 MED ORDER — FUROSEMIDE 10 MG/ML IJ SOLN
40.0000 mg | Freq: Two times a day (BID) | INTRAMUSCULAR | Status: DC
  Administered 2018-06-21 – 2018-06-22 (×2): 40 mg via INTRAVENOUS
  Filled 2018-06-21 (×3): qty 4

## 2018-06-21 NOTE — Evaluation (Addendum)
Physical Therapy Evaluation Patient Details Name: Julie Brewer MRN: [id number] DOB: [birth year] Today's Date: 06/21/2018   History of Present Illness  Julie JOERGER is a 68 y.o. female with medical history significant of DM2, HTN, asthma, GERD, HLD, PVD, anemia, CVA presented with leg swelling, worsening shortness of breath abdominal swelling, weight gain of 11 pounds and shortness of breath with history of minimal exertion.   Clinical Impression  Pt admitted with above diagnosis. Pt currently with functional limitations due to the deficits listed below (see PT Problem List). Pt was able to ambulate a short distance of 20 feet  before she desat to 88% on 5LO2 with DOE 3/4.  Pt requring O2 on RA at rest and with activity at present.  Will follow acutely and progress pt as tolerated.    Pt will benefit from skilled PT to increase their independence and safety with mobility to allow discharge to the venue listed below.    SATURATION QUALIFICATIONS: (This note is used to comply with regulatory documentation for home oxygen)  Patient Saturations on Room Air at Rest = 84%  Patient Saturations on Room Air while Ambulating = NT as pt desat at rest on RA.   Patient Saturations on 5 Liters of oxygen while Ambulating = 88%  Please briefly explain why patient needs home oxygen:Pt desat on RA at rest and needed 5LO2 to keep sats >88%.    Follow Up Recommendations Home health PT;Supervision - Intermittent    Equipment Recommendations  None recommended by PT ? Home O2   Recommendations for Other Services       Precautions / Restrictions Precautions Precautions: Fall Precaution Comments: Pt is blind, watch O2 sats  Restrictions Weight Bearing Restrictions: No      Mobility  Bed Mobility Overal bed mobility: Independent                Transfers Overall transfer level: Independent                  Ambulation/Gait Ambulation/Gait assistance: Supervision Gait  Distance (Feet): 20 Feet Assistive device: None Gait Pattern/deviations: Step-through pattern;Decreased stride length   Gait velocity interpretation: <1.31 ft/sec, indicative of household ambulator General Gait Details: Pt desat on RA at rest to 85%.  Needed 5LO2 at rest to keep sats >92%.  Desat to 88% after 20 feet of ambulation and DOE 3/4 therefore let pt rest.  Lunch had come as well.    Stairs            Wheelchair Mobility    Modified Rankin (Stroke Patients Only)       Balance Overall balance assessment: Needs assistance Sitting-balance support: No upper extremity supported;Feet supported Sitting balance-Leahy Scale: Fair     Standing balance support: No upper extremity supported;During functional activity Standing balance-Leahy Scale: Fair Standing balance comment: No LOB with static stance                             Pertinent Vitals/Pain Pain Assessment: No/denies pain    Home Living Family/patient expects to be discharged to:: Private residence Living Arrangements: Other relatives Available Help at Discharge: Family;Available PRN/intermittently(niece works) Type of Home: House Home Access: Stairs to enter Entrance Stairs-Rails: Right Technical brewer of Steps: 4 Home Layout: One level Home Equipment: Cane - single point      Prior Function Level of Independence: Independent with assistive device(s);Needs assistance   Gait / Transfers Assistance Needed: cane when  going outdoors  ADL's / Homemaking Assistance Needed: Independent with B/D        Hand Dominance        Extremity/Trunk Assessment   Upper Extremity Assessment Upper Extremity Assessment: Defer to OT evaluation    Lower Extremity Assessment Lower Extremity Assessment: Generalized weakness    Cervical / Trunk Assessment Cervical / Trunk Assessment: Normal  Communication   Communication: No difficulties  Cognition Arousal/Alertness: Awake/alert Behavior  During Therapy: WFL for tasks assessed/performed Overall Cognitive Status: Within Functional Limits for tasks assessed                                        General Comments      Exercises General Exercises - Lower Extremity Ankle Circles/Pumps: AROM;Both;10 reps;Seated Long Arc Quad: AROM;Both;10 reps;Seated   Assessment/Plan    PT Assessment Patient needs continued PT services  PT Problem List Decreased mobility;Decreased balance;Decreased activity tolerance;Decreased knowledge of use of DME;Decreased safety awareness;Decreased knowledge of precautions       PT Treatment Interventions DME instruction;Gait training;Functional mobility training;Therapeutic activities;Therapeutic exercise;Balance training;Patient/family education    PT Goals (Current goals can be found in the Care Plan section)  Acute Rehab PT Goals Patient Stated Goal: to go home without Oxygen PT Goal Formulation: With patient Time For Goal Achievement: 07/05/18 Potential to Achieve Goals: Good    Frequency Min 3X/week   Barriers to discharge        Co-evaluation               AM-PAC PT "6 Clicks" Mobility  Outcome Measure Help needed turning from your back to your side while in a flat bed without using bedrails?: None Help needed moving from lying on your back to sitting on the side of a flat bed without using bedrails?: None Help needed moving to and from a bed to a chair (including a wheelchair)?: None Help needed standing up from a chair using your arms (e.g., wheelchair or bedside chair)?: None Help needed to walk in hospital room?: A Little Help needed climbing 3-5 steps with a railing? : A Little 6 Click Score: 22    End of Session Equipment Utilized During Treatment: Gait belt;Oxygen Activity Tolerance: Patient limited by fatigue Patient left: in chair;with call bell/phone within reach Nurse Communication: Mobility status PT Visit Diagnosis: Muscle weakness  (generalized) (M62.81);Other abnormalities of gait and mobility (R26.89)    Time: 1440-1453 PT Time Calculation (min) (ACUTE ONLY): 13 min   Charges:   PT Evaluation $PT Eval Moderate Complexity: [place] Pager:  [phone]  Office:  [place] 06/21/2018, 3:43 PM

## 2018-06-21 NOTE — Progress Notes (Signed)
PROGRESS NOTE  Julie Brewer VZC:[id number] DOB: 26-Apr-1950 DOA: 06/20/2018 PCP: [name], MD   LOS: 1 day   Brief narrative: Julie Brewer is a 68 y.o. female with medical history significant of DM2, HTN, asthma, GERD, HLD, PVD, anemia, CVA presented with leg swelling, worsening shortness of breath abdominal swelling, weight gain of 11 pounds and shortness of breath with history of minimal exertion.  Two-pillow orthopnea. Baseline dry weight about 142 pounds. Patient was recently evaluated by vascular surgery secondary to leg swelling and pain right leg Dopplers were done showed no evidence of DVT  Subjective: Patient feels better than yesterday.  Her breathing has improved.  Denies any chest pain, palpitation, fever.  She states that she has been urinating frequently.  Assessment/Plan:  Active Problems:   Peripheral vascular disease (HCC)   Dyslipidemia   Essential hypertension   GERD (gastroesophageal reflux disease)   AKI (acute kidney injury) ([place])   Acute exacerbation of CHF (congestive heart failure) (HCC)   Acute respiratory failure with hypoxia (HCC)   Tobacco abuse  Acute on chronic CHF exacerbation.  Continue CHF protocol.  Strict intake and output charting.  Fluid restriction.  Telemetry monitor.  Borderline elevation of troponin without chest pain.  EKG was reviewed by me without any acute ischemic changes.  Patient has significantly felt better with IV diuretic.  Continue with that.  BNP on admission was 170. 2 D echo ordered. Continue to wean oxygen as able.  Peripheral vascular disease -chronic currently stable  Dyslipidemia -chronic and currently stable  Essential hypertension continue amlodipine. Monitor BP  GERD  on protonix.  AKI will need to monitor on diuretics. Hold ACEI/ARB.  Tobacco abuse. counseling done.  DM2 -  -continue Lantus, sliding scale insulin Accu-Cheks.  Diabetic diet.   VTE Prophylaxis: lovenox  Code Status: DNR   Family Communication: Spoke with the patient's daughter on the phone and updated her about the clinical condition of the patient.  Disposition Plan: Home likely in 1 to 2 days.   Consultants:  None  Procedures:  None  Antibiotics: Anti-infectives (From admission, onward)   None      Objective: Vitals:   06/21/18 0634 06/21/18 0744  BP:    Pulse: 81   Resp: (!) 21   Temp:  99.3 F (37.4 C)  SpO2: (!) 88%     Intake/Output Summary (Last 24 hours) at 06/21/2018 1137 Last data filed at 06/21/2018 1100 Gross per 24 hour  Intake 240 ml  Output 1850 ml  Net -1610 ml   Filed Weights   06/20/18 1518 06/21/18 0634  Weight: 69.4 kg 71.9 kg   Body mass index is 27.86 kg/m.   Physical Exam: GENERAL: Patient is alert awake and oriented. Not in obvious distress.  On nasal cannula oxygen. HENT: No scleral pallor or icterus. Pupils equally reactive to light. Oral mucosa is moist NECK: is supple, no palpable thyroid enlargement. CHEST: Clear to auscultation.  Crackles at the bases.. Non tender on palpation. Diminished breath sounds bilaterally. CVS: S1 and S2 heard, no murmur. Regular rate and rhythm. No pericardial rub. ABDOMEN: Soft, non-tender, bowel sounds are present. No palpable hepato-splenomegaly. EXTREMITIES: Trace bilateral lower extremity edema. CNS: Cranial nerves are intact. No focal motor or sensory deficits. SKIN: warm and dry without rashes.  Data Review: I have personally reviewed the following laboratory data and studies,  CBC: Recent Labs  Lab 06/20/18 1533 06/20/18 1737 06/21/18 0731  WBC 9.5  --  9.1  HGB 10.1*  10.2* 8.3*  HCT 33.1* 30.0* 26.0*  MCV 80.3  --  76.7*  PLT 360  --  284   Basic Metabolic Panel: Recent Labs  Lab 06/20/18 1533 06/20/18 1737 06/21/18 0731  NA 138 138 139  K 4.9 5.0 4.9  CL 110  --  109  CO2 13*  --  17*  GLUCOSE 72  --  68*  BUN 29*  --  30*  CREATININE 3.11*  --  3.12*  CALCIUM 8.7*  --  8.4*  MG  --    --  1.7  PHOS  --   --  4.7*   Liver Function Tests: Recent Labs  Lab 06/21/18 0731  AST 11*  ALT 9  ALKPHOS 82  BILITOT 0.4  PROT 5.6*  ALBUMIN 2.4*   No results for input(s): LIPASE, AMYLASE in the last 168 hours. No results for input(s): AMMONIA in the last 168 hours. Cardiac Enzymes: Recent Labs  Lab 06/20/18 1533 06/20/18 2143 06/21/18 0241 06/21/18 0731  TROPONINI <0.03 <0.03 <0.03 <0.03   BNP (last 3 results) Recent Labs    06/20/18 1723  BNP 170.8*    ProBNP (last 3 results) No results for input(s): PROBNP in the last 8760 hours.  CBG: Recent Labs  Lab 06/20/18 2347 06/21/18 0348 06/21/18 0449 06/21/18 0821 06/21/18 1002  GLUCAP 145* 65* 125* 64* 151*   Recent Results (from the past 240 hour(s))  SARS Coronavirus 2 (CEPHEID- Performed in [facility] hospital lab), Hosp Order     Status: None   Collection Time: 06/20/18  5:22 PM  Result Value Ref Range Status   SARS Coronavirus 2 NEGATIVE NEGATIVE Final    Comment: (NOTE) If result is NEGATIVE SARS-CoV-2 target nucleic acids are NOT DETECTED. The SARS-CoV-2 RNA is generally detectable in upper and lower  respiratory specimens during the acute phase of infection. The lowest  concentration of SARS-CoV-2 viral copies this assay can detect is 250  copies / mL. A negative result does not preclude SARS-CoV-2 infection  and should not be used as the sole basis for treatment or other  patient management decisions.  A negative result may occur with  improper specimen collection / handling, submission of specimen other  than nasopharyngeal swab, presence of viral mutation(s) within the  areas targeted by this assay, and inadequate number of viral copies  (<250 copies / mL). A negative result must be combined with clinical  observations, patient history, and epidemiological information. If result is POSITIVE SARS-CoV-2 target nucleic acids are DETECTED. The SARS-CoV-2 RNA is generally detectable in upper  and lower  respiratory specimens dur ing the acute phase of infection.  Positive  results are indicative of active infection with SARS-CoV-2.  Clinical  correlation with patient history and other diagnostic information is  necessary to determine patient infection status.  Positive results do  not rule out bacterial infection or co-infection with other viruses. If result is PRESUMPTIVE POSTIVE SARS-CoV-2 nucleic acids MAY BE PRESENT.   A presumptive positive result was obtained on the submitted specimen  and confirmed on repeat testing.  While 2019 novel coronavirus  (SARS-CoV-2) nucleic acids may be present in the submitted sample  additional confirmatory testing may be necessary for epidemiological  and / or clinical management purposes  to differentiate between  SARS-CoV-2 and other Sarbecovirus currently known to infect humans.  If clinically indicated additional testing with an alternate test  methodology [phone]) is advised. The SARS-CoV-2 RNA is generally  detectable in upper and lower  respiratory sp ecimens during the acute  phase of infection. The expected result is Negative. Fact Sheet for Patients:  StrictlyIdeas.no Fact Sheet for Healthcare Providers: BankingDealers.co.za This test is not yet approved or cleared by the [place] FDA and has been authorized for detection and/or diagnosis of SARS-CoV-2 by FDA under an Emergency Use Authorization (EUA).  This EUA will remain in effect (meaning this test can be used) for the duration of the COVID-19 declaration under Section 564(b)(1) of the Act, 21 U.S.C. section 360bbb-3(b)(1), unless the authorization is terminated or revoked sooner. Performed at [facility], [place] [address] [place], [place] [address]      Studies: Dg Chest 2 View  Result Date: 06/20/2018 CLINICAL DATA:  Short of breath EXAM: CHEST - 2 VIEW COMPARISON:  06/08/2017 FINDINGS: Interval development  of prominent interstitial markings bilaterally compatible with edema. Bilateral pleural effusions have developed since the prior study with bibasilar atelectasis. Cardiac loop recorder noted. IMPRESSION: Interval development of congestive heart failure with interstitial edema and bilateral pleural effusions. Electronically Signed   By: Franchot Gallo M.D.   On: 06/20/2018 16:36   Dg Abd 1 View  Result Date: 06/21/2018 CLINICAL DATA:  68 year old female with history of abdominal distension. EXAM: ABDOMEN - 1 VIEW COMPARISON:  Abdominal radiograph 06/09/2017. FINDINGS: Previously noted nasogastric tube has been removed. Gas and stool are seen scattered throughout the colon extending to the level of the distal rectum. No pathologic distension of small bowel is noted. No gross evidence of pneumoperitoneum. IMPRESSION: 1. Nonobstructive bowel gas pattern. 2. No pneumoperitoneum. Electronically Signed   By: Vinnie Langton M.D.   On: 06/21/2018 09:12    Scheduled Meds: . [START ON 06/22/2018] amLODipine  10 mg Oral Q48H  . aspirin  325 mg Oral Daily  . atorvastatin  20 mg Oral Daily  . cilostazol  100 mg Oral BID  . enoxaparin (LOVENOX) injection  30 mg Subcutaneous QHS  . furosemide  60 mg Intravenous BID  . gabapentin  100 mg Oral BID  . indapamide  1.25 mg Oral Daily  . insulin aspart  0-9 Units Subcutaneous Q4H  . insulin glargine  5 Units Subcutaneous Daily  . labetalol  100 mg Oral BID  . nicotine  21 mg Transdermal Daily  . senna  1 tablet Oral BID  . sodium chloride flush  3 mL Intravenous Q12H    Continuous Infusions: . sodium chloride       Flora Lipps, MD  [facility] 06/21/2018

## 2018-06-21 NOTE — Progress Notes (Signed)
  Echocardiogram 2D Echocardiogram has been performed.  Julie Brewer 06/21/2018, 2:44 PM

## 2018-06-21 NOTE — Progress Notes (Signed)
SATURATION QUALIFICATIONS: (This note is used to comply with regulatory documentation for home oxygen)  Patient Saturations on Room Air at Rest = 84%  Patient Saturations on Room Air while Ambulating = NT as pt desat at rest on RA.   Patient Saturations on 5 Liters of oxygen while Ambulating = 88%  Please briefly explain why patient needs home oxygen:Pt desat on RA at rest and needed 5LO2 to keep sats >88%.  [place] Pager:  [phone]  Office:  [phone]

## 2018-06-21 NOTE — Progress Notes (Signed)
Hypoglycemic Event  CBG: 65  Treatment: 120 mLs orange juice, graham crackers  Symptoms: none  Follow-up CBG: Time: 0449 CBG Result: 125  Possible Reasons for Event: lack of PO intake?? Unsure, pt ate in ED.   Comments/MD notified: [place]

## 2018-06-21 NOTE — Progress Notes (Signed)
Foley order d/c as pt is spontaneously voiding almost 1075mLs since arrival to floor. Will continue to follow output.

## 2018-06-22 ENCOUNTER — Encounter (HOSPITAL_COMMUNITY): Payer: Self-pay

## 2018-06-22 ENCOUNTER — Inpatient Hospital Stay (HOSPITAL_COMMUNITY): Payer: Medicare Other

## 2018-06-22 LAB — GLUCOSE, CAPILLARY
Glucose-Capillary: 92 mg/dL (ref 70–99)
Glucose-Capillary: 152 mg/dL — ABNORMAL HIGH (ref 70–99)
Glucose-Capillary: 165 mg/dL — ABNORMAL HIGH (ref 70–99)
Glucose-Capillary: 260 mg/dL — ABNORMAL HIGH (ref 70–99)

## 2018-06-22 LAB — CBC
HCT: 25.4 % — ABNORMAL LOW (ref 36.0–46.0)
Hemoglobin: 8 g/dL — ABNORMAL LOW (ref 12.0–15.0)
MCH: 24.1 pg — ABNORMAL LOW (ref 26.0–34.0)
MCHC: 31.5 g/dL (ref 30.0–36.0)
MCV: 76.5 fL — ABNORMAL LOW (ref 80.0–100.0)
Platelets: 305 10*3/uL (ref 150–400)
RBC: 3.32 MIL/uL — ABNORMAL LOW (ref 3.87–5.11)
RDW: 16.8 % — ABNORMAL HIGH (ref 11.5–15.5)
WBC: 9 10*3/uL (ref 4.0–10.5)
nRBC: 0 % (ref 0.0–0.2)

## 2018-06-22 LAB — SODIUM, URINE, RANDOM: Sodium, Ur: 99 mmol/L

## 2018-06-22 LAB — BASIC METABOLIC PANEL
Anion gap: 10 (ref 5–15)
BUN: 34 mg/dL — ABNORMAL HIGH (ref 8–23)
CO2: 19 mmol/L — ABNORMAL LOW (ref 22–32)
Calcium: 8.1 mg/dL — ABNORMAL LOW (ref 8.9–10.3)
Chloride: 108 mmol/L (ref 98–111)
Creatinine, Ser: 3.06 mg/dL — ABNORMAL HIGH (ref 0.44–1.00)
GFR calc Af Amer: 18 mL/min — ABNORMAL LOW (ref >60)
GFR calc non Af Amer: 15 mL/min — ABNORMAL LOW (ref >60)
Glucose, Bld: 101 mg/dL — ABNORMAL HIGH (ref 70–99)
Potassium: 4.9 mmol/L (ref 3.5–5.1)
Sodium: 137 mmol/L (ref 135–145)

## 2018-06-22 LAB — MAGNESIUM: Magnesium: 1.7 mg/dL (ref 1.7–2.4)

## 2018-06-22 LAB — CREATININE, URINE, RANDOM: Creatinine, Urine: 16.91 mg/dL

## 2018-06-22 MED ORDER — FUROSEMIDE 10 MG/ML IJ SOLN
80.0000 mg | Freq: Two times a day (BID) | INTRAMUSCULAR | Status: DC
  Administered 2018-06-22 – 2018-06-24 (×4): 80 mg via INTRAVENOUS
  Filled 2018-06-22 (×3): qty 8

## 2018-06-22 MED ORDER — METOLAZONE 5 MG PO TABS
2.5000 mg | ORAL_TABLET | Freq: Two times a day (BID) | ORAL | Status: DC
  Administered 2018-06-22 – 2018-06-25 (×6): 2.5 mg via ORAL
  Filled 2018-06-22 (×6): qty 1

## 2018-06-22 NOTE — Consult Note (Addendum)
Renal Service Consult Note [facility]  Julie Brewer 06/22/2018 [name] Requesting Physician:  Dr Carlean Jews. Pokhrel  Reason for Consult:  AKI on CKD 3 HPI: The patient is a 68 y.o. year-old w/ hx of CVA, PAD, anemia, HTN, glaucoma, DM2 on insulin, blindness, asthma presented 5/22 for SOB/ DOE and swelling in legs, feet and abdomen w/ dry cough. CXR showed mild IS edema and bilat effusions.  Pt was admitted and started on IV lasix at 40 mg bid, losartan was held and other BP meds were continued. BP's remain high.  Creat up on admit 3.1 and unchanged today at 3.0. wt's essentially unchanged. I/O showing 3.4 L UOP since admit. Asked to see for a/c renal failure.   States she is f/b Dr Johnney Ou at [facility] and has appt this Tuesday.  Reporting leg swelling and abd swelling on admission, has improved some since admit.  No CP, no orthopnea.  No fevers.   ROS  denies CP  no joint pain   no HA  no blurry vision  no rash  no diarrhea  no nausea/ vomiting  no dysuria  no difficulty voiding  no change in urine color    Past Medical History  Past Medical History:  Diagnosis Date  . Asthma    No probnlems recently  . 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 Diabetes mellitus    Type II per Dr [name]  (patient said type I)  . Fibroid   . GERD (gastroesophageal reflux disease)   . Glaucoma   . Hyperlipidemia   . Hypertension   . Iron deficiency anemia 03/09/2016  . Peripheral vascular disease ([place])   . Pneumonia 2006  . Shortness of breath dyspnea    with exdrtion, "Walkling too fast"  . Stroke [facility])    no residual   Past Surgical History  Past Surgical History:  Procedure Laterality Date  . ABDOMINAL HYSTERECTOMY    . BIOPSY  06/10/2017   Procedure: BIOPSY;  Surgeon: Ronnette Juniper, MD;  Location: [place];  Service: Gastroenterology;;  . CERVICAL FUSION     with graft from hip  . COLONOSCOPY  July 09, 2012  .  DIRECT LARYNGOSCOPY N/A 06/07/2014   Procedure: DIRECT LARYNGOSCOPY with BIOPSY and EXCISION VOLLECULAR CYST;  Surgeon: Ruby Cola, MD;  Location: [facility] [facility] OR;  Service: ENT;  Laterality: N/A;  . ESOPHAGOGASTRODUODENOSCOPY (EGD) WITH PROPOFOL Left 06/10/2017   Procedure: ESOPHAGOGASTRODUODENOSCOPY (EGD) WITH PROPOFOL;  Surgeon: Ronnette Juniper, MD;  Location: [place];  Service: Gastroenterology;  Laterality: Left;  . FLEXIBLE SIGMOIDOSCOPY Left 06/10/2017   Procedure: FLEXIBLE SIGMOIDOSCOPY;  Surgeon: Ronnette Juniper, MD;  Location: [place];  Service: Gastroenterology;  Laterality: Left;  . LOOP RECORDER INSERTION N/A 06/20/2016   Procedure: Loop Recorder Insertion;  Surgeon: [name], MD;  Location: [place] CV LAB;  Service: Cardiovascular;  Laterality: N/A;  . REFRACTIVE SURGERY Bilateral    both eyes  . SPINE SURGERY     lumbar   Family History  Family History  Problem Relation Age of Onset  . Cancer Mother   . Heart disease Mother   . Diabetes Father   . Cancer Brother   . Cancer Brother   . Throat cancer Brother    Social History  reports that she has been smoking cigarettes. She has a 30.00 pack-year smoking history. She has never used smokeless tobacco. She reports current alcohol use. She reports  current drug use. Drug: Methylphenidate. Allergies  Allergies  Allergen Reactions  . Morphine And Related Other (See Comments)    Hallucenations   . Penicillins Rash and Other (See Comments)    Swelling   Home medications Prior to Admission medications   Medication Sig Start Date End Date Taking? Authorizing Provider  aspirin 325 MG EC tablet Take 325 mg by mouth daily.    Yes [provider]  atorvastatin (LIPITOR) 20 MG tablet Take 1 tablet by mouth once daily 05/28/18  Yes Elayne Snare, MD  cilostazol (PLETAL) 100 MG tablet Take 1 tablet by mouth twice daily Patient taking differently: Take 100 mg by mouth 2 (two) times daily.  05/04/18  Yes Waynetta Sandy, MD  diltiazem (CARDIZEM CD) 360 MG 24 hr capsule Take 1 capsule (360 mg total) by mouth daily. 03/14/18  Yes Elayne Snare, MD  gabapentin (NEURONTIN) 100 MG capsule Take 1 capsule by mouth twice daily 06/15/18  Yes Elayne Snare, MD  glucose blood (FREESTYLE LITE) test strip Use as instructed to check blood sugar 1 times a day dx code E11.65 03/14/18  Yes Elayne Snare, MD  indapamide (LOZOL) 1.25 MG tablet Take 1 tablet (1.25 mg total) by mouth daily. 03/14/18  Yes Elayne Snare, MD  insulin glargine (LANTUS) 100 UNIT/ML injection Inject 0.14 mLs (14 Units total) into the skin daily. Patient taking differently: Inject 16 Units into the skin daily. INJECT 16 UNITS UNDER THE SKIN ONCE DAILY. 10/10/17  Yes Elayne Snare, MD  insulin regular (NOVOLIN R RELION) 100 units/mL injection Inject into the skin 2 (two) times daily before a meal. INJECT 10 UNITS UNDER THE SKIN AT BREAKFAST, AND 5 UNITS IN THE EVENING.   Yes [provider]  IRON PO Take 1 tablet by mouth 2 (two) times a day.    Yes [provider]  labetalol (NORMODYNE) 100 MG tablet Take 1 tablet (100 mg total) by mouth 2 (two) times daily. 05/05/18  Yes Elayne Snare, MD  losartan (COZAAR) 25 MG tablet Take 1 tablet by mouth once daily 05/01/18  Yes Elayne Snare, MD  metFORMIN (GLUCOPHAGE-XR) 750 MG 24 hr tablet Take 1 tablet by mouth once daily 05/01/18  Yes Elayne Snare, MD  omeprazole (PRILOSEC) 20 MG capsule TAKE 1 CAPSULE BY MOUTH ONCE DAILY (DUE  FOR  OFFICE  VISIT  THIS  MONTH) 05/28/18  Yes Elayne Snare, MD  Vitamin D, Ergocalciferol, (DRISDOL) 1.25 MG (50000 UT) CAPS capsule TAKE ONE CAPSULE BY MOUTH EVERY 7 DAYS Patient taking differently: Take 50,000 Units by mouth every 30 (thirty) days. Pt takes on 1st day of month 01/30/18  Yes Elayne Snare, MD   Liver Function Tests Recent Labs  Lab 06/21/18 0731  AST 11*  ALT 9  ALKPHOS 82  BILITOT 0.4  PROT 5.6*  ALBUMIN 2.4*   No results for input(s): LIPASE, AMYLASE in the last  168 hours. CBC Recent Labs  Lab 06/20/18 1533 06/20/18 1737 06/21/18 0731 06/22/18 0659  WBC 9.5  --  9.1 9.0  HGB 10.1* 10.2* 8.3* 8.0*  HCT 33.1* 30.0* 26.0* 25.4*  MCV 80.3  --  76.7* 76.5*  PLT 360  --  326 283   Basic Metabolic Panel Recent Labs  Lab 06/20/18 1533 06/20/18 1737 06/21/18 0731 06/22/18 0659  NA 138 138 139 137  K 4.9 5.0 4.9 4.9  CL 110  --  109 108  CO2 13*  --  17* 19*  GLUCOSE 72  --  68* 101*  BUN 29*  --  30* 34*  CREATININE 3.11*  --  3.12* 3.06*  CALCIUM 8.7*  --  8.4* 8.1*  PHOS  --   --  4.7*  --    Iron/TIBC/Ferritin/ %Sat    Component Value Date/Time   IRON 27 (L) 07/17/2017 1012   IRON 37 (L) 01/17/2017 1043   TIBC 270 07/17/2017 1012   TIBC 189 (L) 01/17/2017 1043   FERRITIN 36 07/17/2017 1012   FERRITIN 153 01/17/2017 1043   IRONPCTSAT 10 (L) 07/17/2017 1012   IRONPCTSAT 19 (L) 01/17/2017 1043    Vitals:   06/22/18 0749 06/22/18 1005 06/22/18 1006 06/22/18 1655  BP: (!) 161/66 (!) 161/66 (!) 161/66 (!) 175/75  Pulse: 90 90  89  Resp: 18   15  Temp: 98.4 F (36.9 C)     TempSrc: Oral     SpO2: (!) 87%   98%  Weight:      Height:       Exam Gen alert, blind, pleasant No rash, cyanosis or gangrene Sclera anicteric, throat clear  No jvd or bruits Chest dec'd L base w/ bronchial BS, R base dec'd RRR no MRG Abd soft mildly distended, nontender +bs GU defer MS no joint effusions or deformity Ext 1-2+ pretib bilat edema Neuro is alert, Ox 3 , nf    Date   creat  eGFR  2009- 2016 0.9- 1.2  2017  1.20- 1.58  2018  1.51- 1.64   2019  1.35- 1.71 37- 45  04/2018 2.02         Home meds:  - diltiazem 360 qd/ indapamide 1.25 md qd/ labetalol 100 bid/ losartan 25 qd  - metformin 750 xr qd/ insulin glargine 16u hs/ insulin regular 10 am + 5 pm  - aspirin 325/ cilostazol 100 bid/ atorvastatin 20 qd  - omeprazole 20 qd/ gabapentin 100 bid    CXR 5/22 > IMPRESSION: Interval development of congestive heart failure with  interstitial edema and bilateral pleural effusions   Na 137  K 4.9  CO2 19  BUN 34  Cr 3.06   UA 5/22 > 100 prot, 0-5 rbc/ wbc  Assessment: 1. AoCKD 3 - baseline creat 2.0 last month, here w/ creat 3.0 and vol overload. Still vol overloaded, would continue diuresing and ^ IV lasix to 80 mg bid, add low dose zaroxlyn. BP's are high, no nephrotoxin, cardiorenal vs progression of CKD. UA unremarkable. Get renal US, urine lytes. Will follow.  2. CHF: diast HF decomp in setting of CKD as well. As above.  3. Blindness 4. DM insulin dependent 5. H/o CVA 6. HTN - agree w/ cont'd meds except losartan for now; dc indapamide for now    Plan: 1. As above.       Kelly Splinter  MD 06/22/2018, 5:11 PM

## 2018-06-22 NOTE — TOC Initial Note (Signed)
[facility]) - Initial/Assessment Note    Patient Details  Name: Julie Brewer MRN: [id number] Date of Birth: [birth year]  Transition of Care [facility]) CM/SW Contact:    Claudie Leach, RN Phone Number: 06/22/2018, 5:15 PM  Clinical Narrative:                 Pt from home with family to assist and plans to return home.  Patient reports no barriers in medical care, obtaining medications, or following low sodium diet.  Patient agrees that she needs home health PT and would like to use [facility].  Patient reports she does not need any DME.  She uses a cane if she goes out and does not need other DME at home.  She may need DME oxygen- weaning is being attempted.   Patient will need Home Health PT order with Face to face and DME oxygen order if need prior to discharge.   Expected Discharge Plan: [place] Barriers to Discharge: No Barriers Identified   Patient Goals and CMS Choice   CMS Medicare.gov Compare Post Acute Care list provided to:: Patient Choice offered to / list presented to : Patient  Expected Discharge Plan and Services Expected Discharge Plan: [place] In-house Referral: NA Discharge Planning Services: CM Consult Post Acute Care Choice: Home Health, Durable Medical Equipment Living arrangements for the past 2 months: [place]: PT [place] Agency: [place] ([place]) Date [place]: 06/22/18 Time [place]: 1143 Representative spoke with at [place]: Floydene Flock  Prior Living Arrangements/Services Living arrangements for the past 2 months: [place] with:: Relatives   Do you feel safe going back to the place where you live?: Yes          Current home services: DME(cane)    Activities of Daily Living Home Assistive Devices/Equipment: CBG Meter, Cane (specify quad or straight) ADL Screening (condition at  time of admission) Patient's cognitive ability adequate to safely complete daily activities?: Yes Is the patient deaf or have difficulty hearing?: No Does the patient have difficulty seeing, even when wearing glasses/contacts?: Yes Does the patient have difficulty concentrating, remembering, or making decisions?: No Patient able to express need for assistance with ADLs?: Yes Does the patient have difficulty dressing or bathing?: No Independently performs ADLs?: Yes (appropriate for developmental age) Does the patient have difficulty walking or climbing stairs?: Yes Weakness of Legs: Both Weakness of Arms/Hands: None  Permission Sought/Granted Permission sought to share information with : Facility Art therapist granted to share information with : Yes, Verbal Permission Granted              Emotional Assessment   Attitude/Demeanor/Rapport: Engaged, Gracious Affect (typically observed): Accepting Orientation: : Oriented to Self, Oriented to Place, Oriented to  Time, Oriented to Situation Alcohol / Substance Use: Never Used Psych Involvement: No (comment)  Admission diagnosis:  Shortness of breath [R06.02] Hypoxia [R09.02] Chronic anemia [D64.9] AKI (acute kidney injury) ([place]) [N17.9] Acute congestive heart failure, unspecified heart failure type [facility]) [I50.9] Patient Active Problem List   Diagnosis Date Noted  . AKI (acute kidney injury) ([place]) 06/20/2018  . Acute exacerbation of CHF (congestive heart failure) ([place]) 06/20/2018  . Acute respiratory failure with hypoxia ([place]) 06/20/2018  . Tobacco abuse  06/20/2018  . GERD (gastroesophageal reflux disease) 06/08/2017  . Guaiac positive stools 06/08/2017  . CRI (chronic renal insufficiency), stage 3 (moderate) ([place]) 05/01/2016  . History of TIA (transient ischemic attack) 05/01/2016  . Blind 05/01/2016  . Iron deficiency anemia 03/09/2016  . Syncope and collapse 01/16/2016  . Dysphagia, pharyngoesophageal  phase 05/05/2014  . Insulin dependent diabetes mellitus with complications ([place]) 67/59/1638  . Essential hypertension 10/06/2012  . Dyslipidemia 09/22/2012  . Peripheral vascular disease ([place]) 09/01/2012   PCP:  [name], MD Pharmacy:   [place], [place] - [address] [place] [address] Phone: [phone] Fax: [phone]

## 2018-06-22 NOTE — Progress Notes (Addendum)
PROGRESS NOTE  Julie Brewer FGH:[id number] DOB: 03-05-1950 DOA: 06/20/2018 PCP: [name], MD   LOS: 2 days   Brief narrative: Julie Brewer is a 68 y.o. female with medical history significant of DM2, HTN, asthma, GERD, HLD, PVD, anemia, CVA presented with leg swelling, worsening shortness of breath abdominal swelling, weight gain of 11 pounds and shortness of breath with history of minimal exertion.  Two-pillow orthopnea. Baseline dry weight about 142 pounds. Patient was recently evaluated by vascular surgery secondary to leg swelling and pain right leg Dopplers were done showed no evidence of DVT  Subjective: Patient feels and feels better overall.  She is however requiring supplemental oxygen and was hypoxic on ambulation.  Patient denies any chest pain, fever, chills or rigor.  Denies nausea vomiting or diarrhea.  Assessment/Plan:  Active Problems:   Peripheral vascular disease (HCC)   Dyslipidemia   Essential hypertension   GERD (gastroesophageal reflux disease)   AKI (acute kidney injury) ([place])   Acute exacerbation of CHF (congestive heart failure) (HCC)   Acute respiratory failure with hypoxia (HCC)   Tobacco abuse  Acute on chronic CHF exacerbation.  Chest x-ray showed congestive heart failure with left pleural effusion.  Responding with IV diuresis.  Continue CHF protocol.  Strict intake and output charting.  Fluid restriction. .  Borderline elevation of troponin without chest pain.  EKG was reviewed by me without any acute ischemic changes.  Patient has significantly felt better with IV diuretic.  Continue with that.  BNP on admission was 170.  Continue to wean oxygen as able.  Acute hypoxic respiratory failure on presentation.  Likely secondary to congestive heart failure pleural effusion.  Patient is still requiring oxygen supplementation and was extremely hypoxic on ambulation.  We will continue with IV diuresis.  Spoke with nephrology diuresis with AKI on  the background of CKD.  Peripheral vascular disease -chronic currently stable   Dyslipidemia -chronic and currently stable  Essential hypertension continue amlodipine.  Blood pressure is still accelerated.  We will adjust medications.  GERD  on protonix.  Acute kidney injury with possible CKD stage III. will need to monitor on diuretics.  ACEI/ARB on hold.  Patient is to see [facility] in the past.  Will consult due to congestive heart failure, need for oxygenation and acute kidney injury.  Anemia of chronic kidney disease.  No evidence of bleeding.  Tobacco abuse. counseling done.  DM2 -  -continue Lantus, sliding scale insulin Accu-Cheks.  Diabetic diet.  Glycemic control is adequate   VTE Prophylaxis: lovenox  Code Status: DNR  Family Communication: I again spoke with the patient's daughter on phone today and updated her about the clinical condition of the patient.  Disposition Plan: Home likely in 1 to 2 days.  We will get nephrology evaluation.  We will try to wean oxygen as possible.  We will continue with diuresis.Check labs in a.m.  Consultants:  Consult nephro  Procedures:   None  Antibiotics: Anti-infectives (From admission, onward)   None      Objective: Vitals:   06/22/18 1005 06/22/18 1006  BP: (!) 161/66 (!) 161/66  Pulse: 90   Resp:    Temp:    SpO2:      Intake/Output Summary (Last 24 hours) at 06/22/2018 1009 Last data filed at 06/22/2018 0300 Gross per 24 hour  Intake 150 ml  Output 2100 ml  Net -1950 ml   Filed Weights   06/20/18 1518 06/21/18 0634 06/22/18 0629  Weight:  69.4 kg 71.9 kg 68.9 kg   Body mass index is 26.7 kg/m.   Physical Exam: GENERAL: Patient is alert awake and oriented. Not in obvious distress.  On nasal cannula oxygen. HENT: No scleral pallor or icterus. Pupils equally reactive to light. Oral mucosa is moist NECK: is supple, no palpable thyroid enlargement. CHEST: Diminished breath sounds bilaterally.  CVS: S1 and S2 heard, no murmur. Regular rate and rhythm. No pericardial rub. ABDOMEN: Soft, non-tender, bowel sounds are present. No palpable hepato-splenomegaly. EXTREMITIES: Trace bilateral lower extremity edema. CNS: Cranial nerves are intact. No focal motor or sensory deficits. SKIN: warm and dry without rashes.  Data Review: I have personally reviewed the following laboratory data and studies,  CBC: Recent Labs  Lab 06/20/18 1533 06/20/18 1737 06/21/18 0731 06/22/18 0659  WBC 9.5  --  9.1 9.0  HGB 10.1* 10.2* 8.3* 8.0*  HCT 33.1* 30.0* 26.0* 25.4*  MCV 80.3  --  76.7* 76.5*  PLT 360  --  326 299   Basic Metabolic Panel: Recent Labs  Lab 06/20/18 1533 06/20/18 1737 06/21/18 0731 06/22/18 0659  NA 138 138 139 137  K 4.9 5.0 4.9 4.9  CL 110  --  109 108  CO2 13*  --  17* 19*  GLUCOSE 72  --  68* 101*  BUN 29*  --  30* 34*  CREATININE 3.11*  --  3.12* 3.06*  CALCIUM 8.7*  --  8.4* 8.1*  MG  --   --  1.7 1.7  PHOS  --   --  4.7*  --    Liver Function Tests: Recent Labs  Lab 06/21/18 0731  AST 11*  ALT 9  ALKPHOS 82  BILITOT 0.4  PROT 5.6*  ALBUMIN 2.4*   No results for input(s): LIPASE, AMYLASE in the last 168 hours. No results for input(s): AMMONIA in the last 168 hours. Cardiac Enzymes: Recent Labs  Lab 06/20/18 1533 06/20/18 2143 06/21/18 0241 06/21/18 0731  TROPONINI <0.03 <0.03 <0.03 <0.03   BNP (last 3 results) Recent Labs    06/20/18 1723 06/21/18 1533  BNP 170.8* 93.4    ProBNP (last 3 results) No results for input(s): PROBNP in the last 8760 hours.  CBG: Recent Labs  Lab 06/21/18 0821 06/21/18 1002 06/21/18 1333 06/21/18 2147 06/22/18 0625  GLUCAP 64* 151* 128* 316* 92   Recent Results (from the past 240 hour(s))  SARS Coronavirus 2 (CEPHEID- Performed in [place] hospital lab), Hosp Order     Status: None   Collection Time: 06/20/18  5:22 PM  Result Value Ref Range Status   SARS Coronavirus 2 NEGATIVE NEGATIVE Final     Comment: (NOTE) If result is NEGATIVE SARS-CoV-2 target nucleic acids are NOT DETECTED. The SARS-CoV-2 RNA is generally detectable in upper and lower  respiratory specimens during the acute phase of infection. The lowest  concentration of SARS-CoV-2 viral copies this assay can detect is 250  copies / mL. A negative result does not preclude SARS-CoV-2 infection  and should not be used as the sole basis for treatment or other  patient management decisions.  A negative result may occur with  improper specimen collection / handling, submission of specimen other  than nasopharyngeal swab, presence of viral mutation(s) within the  areas targeted by this assay, and inadequate number of viral copies  (<250 copies / mL). A negative result must be combined with clinical  observations, patient history, and epidemiological information. If result is POSITIVE SARS-CoV-2 target nucleic acids are  DETECTED. The SARS-CoV-2 RNA is generally detectable in upper and lower  respiratory specimens dur ing the acute phase of infection.  Positive  results are indicative of active infection with SARS-CoV-2.  Clinical  correlation with patient history and other diagnostic information is  necessary to determine patient infection status.  Positive results do  not rule out bacterial infection or co-infection with other viruses. If result is PRESUMPTIVE POSTIVE SARS-CoV-2 nucleic acids MAY BE PRESENT.   A presumptive positive result was obtained on the submitted specimen  and confirmed on repeat testing.  While 2019 novel coronavirus  (SARS-CoV-2) nucleic acids may be present in the submitted sample  additional confirmatory testing may be necessary for epidemiological  and / or clinical management purposes  to differentiate between  SARS-CoV-2 and other Sarbecovirus currently known to infect humans.  If clinically indicated additional testing with an alternate test  methodology [phone]) is advised. The  SARS-CoV-2 RNA is generally  detectable in upper and lower respiratory sp ecimens during the acute  phase of infection. The expected result is Negative. Fact Sheet for Patients:  StrictlyIdeas.no Fact Sheet for Healthcare Providers: BankingDealers.co.za This test is not yet approved or cleared by the [place] FDA and has been authorized for detection and/or diagnosis of SARS-CoV-2 by FDA under an Emergency Use Authorization (EUA).  This EUA will remain in effect (meaning this test can be used) for the duration of the COVID-19 declaration under Section 564(b)(1) of the Act, 21 U.S.C. section 360bbb-3(b)(1), unless the authorization is terminated or revoked sooner. Performed at [facility], [place] [address] [place], [place] [address]      Studies: Dg Chest 2 View  Result Date: 06/20/2018 CLINICAL DATA:  Short of breath EXAM: CHEST - 2 VIEW COMPARISON:  06/08/2017 FINDINGS: Interval development of prominent interstitial markings bilaterally compatible with edema. Bilateral pleural effusions have developed since the prior study with bibasilar atelectasis. Cardiac loop recorder noted. IMPRESSION: Interval development of congestive heart failure with interstitial edema and bilateral pleural effusions. Electronically Signed   By: Franchot Gallo M.D.   On: 06/20/2018 16:36   Dg Abd 1 View  Result Date: 06/21/2018 CLINICAL DATA:  68 year old female with history of abdominal distension. EXAM: ABDOMEN - 1 VIEW COMPARISON:  Abdominal radiograph 06/09/2017. FINDINGS: Previously noted nasogastric tube has been removed. Gas and stool are seen scattered throughout the colon extending to the level of the distal rectum. No pathologic distension of small bowel is noted. No gross evidence of pneumoperitoneum. IMPRESSION: 1. Nonobstructive bowel gas pattern. 2. No pneumoperitoneum. Electronically Signed   By: Vinnie Langton M.D.   On: 06/21/2018 09:12     Scheduled Meds: . amLODipine  10 mg Oral Q48H  . aspirin  325 mg Oral Daily  . atorvastatin  20 mg Oral Daily  . cilostazol  100 mg Oral BID  . enoxaparin (LOVENOX) injection  30 mg Subcutaneous QHS  . furosemide  40 mg Intravenous BID  . gabapentin  100 mg Oral BID  . indapamide  1.25 mg Oral Daily  . insulin aspart  0-9 Units Subcutaneous TID AC & HS  . insulin glargine  5 Units Subcutaneous Daily  . labetalol  100 mg Oral BID  . nicotine  21 mg Transdermal Daily  . pantoprazole  40 mg Oral Daily  . senna  1 tablet Oral BID  . sodium chloride flush  3 mL Intravenous Q12H    Continuous Infusions: . sodium chloride       Marley Pakula  Somaya Grassi, MD  [facility] 06/22/2018

## 2018-06-23 LAB — BASIC METABOLIC PANEL
Anion gap: 12 (ref 5–15)
BUN: 33 mg/dL — ABNORMAL HIGH (ref 8–23)
CO2: 19 mmol/L — ABNORMAL LOW (ref 22–32)
Calcium: 8.4 mg/dL — ABNORMAL LOW (ref 8.9–10.3)
Chloride: 106 mmol/L (ref 98–111)
Creatinine, Ser: 2.86 mg/dL — ABNORMAL HIGH (ref 0.44–1.00)
GFR calc Af Amer: 19 mL/min — ABNORMAL LOW (ref >60)
GFR calc non Af Amer: 17 mL/min — ABNORMAL LOW (ref >60)
Glucose, Bld: 92 mg/dL (ref 70–99)
Potassium: 4.5 mmol/L (ref 3.5–5.1)
Sodium: 137 mmol/L (ref 135–145)

## 2018-06-23 LAB — GLUCOSE, CAPILLARY
Glucose-Capillary: 105 mg/dL — ABNORMAL HIGH (ref 70–99)
Glucose-Capillary: 274 mg/dL — ABNORMAL HIGH (ref 70–99)
Glucose-Capillary: 263 mg/dL — ABNORMAL HIGH (ref 70–99)
Glucose-Capillary: 284 mg/dL — ABNORMAL HIGH (ref 70–99)

## 2018-06-23 LAB — CBC
HCT: 26.9 % — ABNORMAL LOW (ref 36.0–46.0)
Hemoglobin: 8.4 g/dL — ABNORMAL LOW (ref 12.0–15.0)
MCH: 23.9 pg — ABNORMAL LOW (ref 26.0–34.0)
MCHC: 31.2 g/dL (ref 30.0–36.0)
MCV: 76.6 fL — ABNORMAL LOW (ref 80.0–100.0)
Platelets: 312 10*3/uL (ref 150–400)
RBC: 3.51 MIL/uL — ABNORMAL LOW (ref 3.87–5.11)
RDW: 16.8 % — ABNORMAL HIGH (ref 11.5–15.5)
WBC: 7.5 10*3/uL (ref 4.0–10.5)
nRBC: 0 % (ref 0.0–0.2)

## 2018-06-23 LAB — MAGNESIUM: Magnesium: 1.6 mg/dL — ABNORMAL LOW (ref 1.7–2.4)

## 2018-06-23 NOTE — Progress Notes (Signed)
Physical Therapy Treatment Patient Details Name: Julie Brewer MRN: [id number] DOB: 10/16/1950 Today's Date: 06/23/2018    History of Present Illness Julie Brewer is a 68 y.o. female with medical history significant of DM2, HTN, asthma, GERD, HLD, PVD, anemia, CVA presented with leg swelling, worsening shortness of breath abdominal swelling, weight gain of 11 pounds and shortness of breath with history of minimal exertion.     PT Comments    Pt progressing well. Pt SPO2 >88% on RA, minimal DOE at 2/4 with ambulation. 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 Pt able to amb safely with holding onto back of PTs arm with her Left hand. Pt progressing well and anticipate will be safe to d/c home with niece once medically stable.    Follow Up Recommendations  Home health PT;Supervision - Intermittent     Equipment Recommendations  None recommended by PT    Recommendations for Other Services       Precautions / Restrictions Precautions Precautions: Fall Precaution Comments: pt is blind Restrictions Weight Bearing Restrictions: No    Mobility  Bed Mobility               General bed mobility comments: pt up in chair upon PT arrival  Transfers Overall transfer level: Independent Equipment used: None             General transfer comment: pt pushed up cautiously from arm rests of chair, steady  Ambulation/Gait Ambulation/Gait assistance: Min guard Gait Distance (Feet): 150 Feet Assistive device: None(held on back of PTs arm with L UE due to being blind) Gait Pattern/deviations: Decreased stride length;Step-through pattern   Gait velocity interpretation: 1.31 - 2.62 ft/sec, indicative of limited community ambulator General Gait Details: pt SpO2 >88% on RA, mild SOB, reports 2/4 DOE. Pt slow and cautious due to typically amb with her vision stick   Stairs             Wheelchair Mobility    Modified Rankin (Stroke  Patients Only)       Balance Overall balance assessment: Needs assistance Sitting-balance support: No upper extremity supported;Feet supported Sitting balance-Leahy Scale: Good     Standing balance support: No upper extremity supported;During functional activity Standing balance-Leahy Scale: Fair                              Cognition Arousal/Alertness: Awake/alert Behavior During Therapy: WFL for tasks assessed/performed Overall Cognitive Status: Within Functional Limits for tasks assessed                                        Exercises      General Comments General comments (skin integrity, edema, etc.): VSS      Pertinent Vitals/Pain Pain Assessment: No/denies pain    Home Living                      Prior Function            PT Goals (current goals can now be found in the care plan section) Acute Rehab PT Goals Patient Stated Goal: go home Progress towards PT goals: Progressing toward goals    Frequency    Min 3X/week      PT Plan Current plan remains appropriate    Co-evaluation  AM-PAC PT "6 Clicks" Mobility   Outcome Measure  Help needed turning from your back to your side while in a flat bed without using bedrails?: None Help needed moving from lying on your back to sitting on the side of a flat bed without using bedrails?: None Help needed moving to and from a bed to a chair (including a wheelchair)?: None Help needed standing up from a chair using your arms (e.g., wheelchair or bedside chair)?: None Help needed to walk in hospital room?: A Little Help needed climbing 3-5 steps with a railing? : A Little 6 Click Score: 22    End of Session Equipment Utilized During Treatment: Gait belt Activity Tolerance: Patient tolerated treatment well Patient left: in chair;with call bell/phone within reach Nurse Communication: Mobility status PT Visit Diagnosis: Muscle weakness (generalized)  (M62.81);Other abnormalities of gait and mobility (R26.89)     Time: 3254-9826 PT Time Calculation (min) (ACUTE ONLY): 15 min  Charges:  $Gait Training: 8-22 mins                     Kittie Plater, PT, DPT Acute Rehabilitation Services Pager #: [phone] Office #: [phone]    Berline Lopes 06/23/2018, 11:15 AM

## 2018-06-23 NOTE — Progress Notes (Signed)
PROGRESS NOTE  Fahmida Jurich Brewer WCB:[id number] DOB: Aug 09, 1950 DOA: 06/20/2018 PCP: [name], MD   LOS: 3 days   Brief narrative: Julie Brewer is a 68 y.o. female with medical history significant of DM2, HTN, asthma, GERD, HLD, PVD, anemia, CVA presented with leg swelling, worsening shortness of breath abdominal swelling, weight gain of 11 pounds and shortness of breath with history of minimal exertion.  Two-pillow orthopnea. Baseline dry weight about 142 pounds. Patient was recently evaluated by vascular surgery secondary to leg swelling and pain right leg Dopplers were done showed no evidence of DVT  Subjective: Patient doing okay with breathing.  Denies any chest pain palpitation fever or chills.  Still requiring oxygen supplementation.  Has been diuresing with Lasix.  Assessment/Plan:  Active Problems:   Peripheral vascular disease (HCC)   Dyslipidemia   Essential hypertension   GERD (gastroesophageal reflux disease)   AKI (acute kidney injury) ([place])   Acute exacerbation of CHF (congestive heart failure) (HCC)   Acute respiratory failure with hypoxia (HCC)   Tobacco abuse  Acute on chronic CHF exacerbation.  Chest x-ray showed congestive heart failure with left pleural effusion.  Responding with IV diuresis.  Nephrology was consulted due to chronic kidney disease volume overload.  Continue CHF protocol.  Strict intake and output charting.  Fluid restriction.   Borderline elevation of troponin without chest pain.  EKG was reviewed by me without any acute ischemic changes.  Zaroxolyn was added to patient's diuretic regimen by nephrology.  Lasix dose has been increased to 80 mg twice a day.  Wean oxygen as tolerated.  Acute hypoxic respiratory failure on presentation.  Likely secondary to congestive heart failure pleural effusion.  Patient is still requiring oxygen supplementation and was extremely hypoxic on ambulation.  We will continue with IV diuresis.  We will try to  wean oxygen if possible.  Ambulate the patient.  Hypomagnesemia.  Will replenish.  Peripheral vascular disease -chronic currently stable   Dyslipidemia -chronic and currently stable  Essential hypertension continue amlodipine.  Improving on labetalol.  GERD  on protonix.  Acute kidney injury with possible CKD stage III.  Improving creatinine levels with diuresis. Baseline creatinine levels of 2.0. Will need to monitor on diuretics.  ACEI/ARB on hold.  Nephrology on board.  Anemia of chronic kidney disease.  No evidence of bleeding.  No need for transfusion  Tobacco abuse. On nicotine patch.  DM2 -  -continue Lantus, sliding scale insulin Accu-Cheks.  Diabetic diet.  Glycemic control is adequate   VTE Prophylaxis: lovenox  Code Status: DNR  Family Communication: None today  Disposition Plan: Home likely in 1 to 2 days.  Continue diuresis.  We will continue to wean oxygen.  Patient has been seen by physical therapy recommend home health PT on discharge.  Consultants:   nephrology  Procedures:   None  Antibiotics: Anti-infectives (From admission, onward)   None      Objective: Vitals:   06/23/18 0741 06/23/18 1122  BP: (!) 156/68 (!) 134/94  Pulse: 96 90  Resp: 13 17  Temp: 98.4 F (36.9 C) 97.9 F (36.6 C)  SpO2: 97% 98%    Intake/Output Summary (Last 24 hours) at 06/23/2018 1326 Last data filed at 06/23/2018 1123 Gross per 24 hour  Intake 150 ml  Output 1900 ml  Net -1750 ml   Filed Weights   06/21/18 0634 06/22/18 0629 06/23/18 0316  Weight: 71.9 kg 68.9 kg 66.4 kg   Body mass index is 25.73 kg/m.  Physical Exam: GENERAL: Patient is alert awake and oriented. Not in obvious distress.  On nasal cannula oxygen.Legally blind HENT: No scleral pallor or icterus. Pupils equally reactive to light. Oral mucosa is moist NECK: is supple, no palpable thyroid enlargement. CHEST: Diminished breath sounds bilaterally. CVS: S1 and S2 heard, no murmur.  Regular rate and rhythm. No pericardial rub. ABDOMEN: Soft, non-tender, bowel sounds are present. No palpable hepato-splenomegaly. EXTREMITIES: Trace bilateral lower extremity edema. CNS: Cranial nerves are intact. No focal motor or sensory deficits. SKIN: warm and dry without rashes.  Data Review: I have personally reviewed the following laboratory data and studies,  CBC: Recent Labs  Lab 06/20/18 1533 06/20/18 1737 06/21/18 0731 06/22/18 0659 06/23/18 0446  WBC 9.5  --  9.1 9.0 7.5  HGB 10.1* 10.2* 8.3* 8.0* 8.4*  HCT 33.1* 30.0* 26.0* 25.4* 26.9*  MCV 80.3  --  76.7* 76.5* 76.6*  PLT 360  --  326 305 759   Basic Metabolic Panel: Recent Labs  Lab 06/20/18 1533 06/20/18 1737 06/21/18 0731 06/22/18 0659 06/23/18 0446  NA 138 138 139 137 137  K 4.9 5.0 4.9 4.9 4.5  CL 110  --  109 108 106  CO2 13*  --  17* 19* 19*  GLUCOSE 72  --  68* 101* 92  BUN 29*  --  30* 34* 33*  CREATININE 3.11*  --  3.12* 3.06* 2.86*  CALCIUM 8.7*  --  8.4* 8.1* 8.4*  MG  --   --  1.7 1.7 1.6*  PHOS  --   --  4.7*  --   --    Liver Function Tests: Recent Labs  Lab 06/21/18 0731  AST 11*  ALT 9  ALKPHOS 82  BILITOT 0.4  PROT 5.6*  ALBUMIN 2.4*   No results for input(s): LIPASE, AMYLASE in the last 168 hours. No results for input(s): AMMONIA in the last 168 hours. Cardiac Enzymes: Recent Labs  Lab 06/20/18 1533 06/20/18 2143 06/21/18 0241 06/21/18 0731  TROPONINI <0.03 <0.03 <0.03 <0.03   BNP (last 3 results) Recent Labs    06/20/18 1723 06/21/18 1533  BNP 170.8* 93.4    ProBNP (last 3 results) No results for input(s): PROBNP in the last 8760 hours.  CBG: Recent Labs  Lab 06/22/18 1234 06/22/18 1653 06/22/18 2112 06/23/18 0625 06/23/18 1121  GLUCAP 152* 165* 260* 105* 274*   Recent Results (from the past 240 hour(s))  SARS Coronavirus 2 (CEPHEID- Performed in [place] hospital lab), Hosp Order     Status: None   Collection Time: 06/20/18  5:22 PM  Result  Value Ref Range Status   SARS Coronavirus 2 NEGATIVE NEGATIVE Final    Comment: (NOTE) If result is NEGATIVE SARS-CoV-2 target nucleic acids are NOT DETECTED. The SARS-CoV-2 RNA is generally detectable in upper and lower  respiratory specimens during the acute phase of infection. The lowest  concentration of SARS-CoV-2 viral copies this assay can detect is 250  copies / mL. A negative result does not preclude SARS-CoV-2 infection  and should not be used as the sole basis for treatment or other  patient management decisions.  A negative result may occur with  improper specimen collection / handling, submission of specimen other  than nasopharyngeal swab, presence of viral mutation(s) within the  areas targeted by this assay, and inadequate number of viral copies  (<250 copies / mL). A negative result must be combined with clinical  observations, patient history, and epidemiological information. If result is  POSITIVE SARS-CoV-2 target nucleic acids are DETECTED. The SARS-CoV-2 RNA is generally detectable in upper and lower  respiratory specimens dur ing the acute phase of infection.  Positive  results are indicative of active infection with SARS-CoV-2.  Clinical  correlation with patient history and other diagnostic information is  necessary to determine patient infection status.  Positive results do  not rule out bacterial infection or co-infection with other viruses. If result is PRESUMPTIVE POSTIVE SARS-CoV-2 nucleic acids MAY BE PRESENT.   A presumptive positive result was obtained on the submitted specimen  and confirmed on repeat testing.  While 2019 novel coronavirus  (SARS-CoV-2) nucleic acids may be present in the submitted sample  additional confirmatory testing may be necessary for epidemiological  and / or clinical management purposes  to differentiate between  SARS-CoV-2 and other Sarbecovirus currently known to infect humans.  If clinically indicated additional testing  with an alternate test  methodology [phone]) is advised. The SARS-CoV-2 RNA is generally  detectable in upper and lower respiratory sp ecimens during the acute  phase of infection. The expected result is Negative. Fact Sheet for Patients:  StrictlyIdeas.no Fact Sheet for Healthcare Providers: BankingDealers.co.za This test is not yet approved or cleared by the [place] FDA and has been authorized for detection and/or diagnosis of SARS-CoV-2 by FDA under an Emergency Use Authorization (EUA).  This EUA will remain in effect (meaning this test can be used) for the duration of the COVID-19 declaration under Section 564(b)(1) of the Act, 21 U.S.C. section 360bbb-3(b)(1), unless the authorization is terminated or revoked sooner. Performed at [facility], [place] [address] [place], [place] [address]      Studies: US Renal  Result Date: 06/22/2018 CLINICAL DATA:  Acute on chronic renal failure. EXAM: RENAL / URINARY TRACT ULTRASOUND COMPLETE COMPARISON:  Abdomen CT 06/09/2017. FINDINGS: Right Kidney: Renal measurements: 10.8 x 5.3 x 6.3 cm = volume: 189 mL. Increased echogenicity without hydronephrosis. Left Kidney: Renal measurements: 9.5 x 4.8 x 5.7 cm = volume: 136 mL. Diffusely increased echogenicity without hydronephrosis. Bladder: Appears normal for degree of bladder distention. IMPRESSION: 1. Increased echogenicity of both kidneys, suggesting medical renal disease. 2. No hydronephrosis. Electronically Signed   By: Misty Stanley M.D.   On: 06/22/2018 20:09    Scheduled Meds: . amLODipine  10 mg Oral Q48H  . aspirin  325 mg Oral Daily  . atorvastatin  20 mg Oral Daily  . cilostazol  100 mg Oral BID  . enoxaparin (LOVENOX) injection  30 mg Subcutaneous QHS  . furosemide  80 mg Intravenous BID  . gabapentin  100 mg Oral BID  . insulin aspart  0-9 Units Subcutaneous TID AC & HS  . insulin glargine  5 Units Subcutaneous Daily  .  labetalol  100 mg Oral BID  . metolazone  2.5 mg Oral BID  . nicotine  21 mg Transdermal Daily  . pantoprazole  40 mg Oral Daily  . senna  1 tablet Oral BID  . sodium chloride flush  3 mL Intravenous Q12H    Continuous Infusions: . sodium chloride       Flora Lipps, MD  [facility] 06/23/2018

## 2018-06-23 NOTE — Plan of Care (Signed)
  Problem: Education: Goal: Knowledge of General Education information will improve Description: Including pain rating scale, medication(s)/side effects and non-pharmacologic comfort measures Outcome: Progressing   Problem: Health Behavior/Discharge Planning: Goal: Ability to manage health-related needs will improve Outcome: Progressing   Problem: Clinical Measurements: Goal: Will remain free from infection Outcome: Progressing Goal: Respiratory complications will improve Outcome: Progressing   Problem: Nutrition: Goal: Adequate nutrition will be maintained Outcome: Progressing   

## 2018-06-23 NOTE — Care Management Important Message (Signed)
Important Message  Patient Details  Name: Julie Brewer MRN: [id number] Date of Birth: 1950/11/18   Medicare Important Message Given:  Yes Orbie Pyo signed at patient daughter s request   Orbie Pyo 06/23/2018, 2:11 PM

## 2018-06-23 NOTE — Progress Notes (Signed)
Patient ID: Julie Brewer, female   DOB: 1950/04/15, 68 y.o.   MRN: [id number] S: feels better today O:BP (!) 134/94 (BP Location: Right Arm)   Pulse 90   Temp 97.9 F (36.6 C) (Oral)   Resp 17   Ht 5' 3.25" (1.607 m)   Wt 66.4 kg   SpO2 98%   BMI 25.73 kg/m   Intake/Output Summary (Last 24 hours) at 06/23/2018 1623 Last data filed at 06/23/2018 1410 Gross per 24 hour  Intake 550 ml  Output 2300 ml  Net -1750 ml   Intake/Output: I/O last 3 completed shifts: In: 300 [P.O.:300] Out: 2200 [Urine:2200]  Intake/Output this shift:  Total I/O In: 400 [P.O.:400] Out: 700 [Urine:700] Weight change: -2.493 kg Gen: NAD CVS: no rub Resp: cta Abd: +BS, soft, NT Ext: trace edema  Recent Labs  Lab 06/20/18 1533 06/20/18 1737 06/21/18 0731 06/22/18 0659 06/23/18 0446  NA 138 138 139 137 137  K 4.9 5.0 4.9 4.9 4.5  CL 110  --  109 108 106  CO2 13*  --  17* 19* 19*  GLUCOSE 72  --  68* 101* 92  BUN 29*  --  30* 34* 33*  CREATININE 3.11*  --  3.12* 3.06* 2.86*  ALBUMIN  --   --  2.4*  --   --   CALCIUM 8.7*  --  8.4* 8.1* 8.4*  PHOS  --   --  4.7*  --   --   AST  --   --  11*  --   --   ALT  --   --  9  --   --    Liver Function Tests: Recent Labs  Lab 06/21/18 0731  AST 11*  ALT 9  ALKPHOS 82  BILITOT 0.4  PROT 5.6*  ALBUMIN 2.4*   No results for input(s): LIPASE, AMYLASE in the last 168 hours. No results for input(s): AMMONIA in the last 168 hours. CBC: Recent Labs  Lab 06/20/18 1533  06/21/18 0731 06/22/18 0659 06/23/18 0446  WBC 9.5  --  9.1 9.0 7.5  HGB 10.1*   < > 8.3* 8.0* 8.4*  HCT 33.1*   < > 26.0* 25.4* 26.9*  MCV 80.3  --  76.7* 76.5* 76.6*  PLT 360  --  326 305 312   < > = values in this interval not displayed.   Cardiac Enzymes: Recent Labs  Lab 06/20/18 1533 06/20/18 2143 06/21/18 0241 06/21/18 0731  TROPONINI <0.03 <0.03 <0.03 <0.03   CBG: Recent Labs  Lab 06/22/18 1234 06/22/18 1653 06/22/18 2112 06/23/18 0625  06/23/18 1121  GLUCAP 152* 165* 260* 105* 274*    Iron Studies: No results for input(s): IRON, TIBC, TRANSFERRIN, FERRITIN in the last 72 hours. Studies/Results: US Renal  Result Date: 06/22/2018 CLINICAL DATA:  Acute on chronic renal failure. EXAM: RENAL / URINARY TRACT ULTRASOUND COMPLETE COMPARISON:  Abdomen CT 06/09/2017. FINDINGS: Right Kidney: Renal measurements: 10.8 x 5.3 x 6.3 cm = volume: 189 mL. Increased echogenicity without hydronephrosis. Left Kidney: Renal measurements: 9.5 x 4.8 x 5.7 cm = volume: 136 mL. Diffusely increased echogenicity without hydronephrosis. Bladder: Appears normal for degree of bladder distention. IMPRESSION: 1. Increased echogenicity of both kidneys, suggesting medical renal disease. 2. No hydronephrosis. Electronically Signed   By: Misty Stanley M.D.   On: 06/22/2018 20:09   . amLODipine  10 mg Oral Q48H  . aspirin  325 mg Oral Daily  . atorvastatin  20 mg Oral Daily  .  cilostazol  100 mg Oral BID  . enoxaparin (LOVENOX) injection  30 mg Subcutaneous QHS  . furosemide  80 mg Intravenous BID  . gabapentin  100 mg Oral BID  . insulin aspart  0-9 Units Subcutaneous TID AC & HS  . insulin glargine  5 Units Subcutaneous Daily  . labetalol  100 mg Oral BID  . metolazone  2.5 mg Oral BID  . nicotine  21 mg Transdermal Daily  . pantoprazole  40 mg Oral Daily  . senna  1 tablet Oral BID  . sodium chloride flush  3 mL Intravenous Q12H    BMET    Component Value Date/Time   NA 137 06/23/2018 0446   K 4.5 06/23/2018 0446   CL 106 06/23/2018 0446   CO2 19 (L) 06/23/2018 0446   GLUCOSE 92 06/23/2018 0446   BUN 33 (H) 06/23/2018 0446   CREATININE 2.86 (H) 06/23/2018 0446   CREATININE 1.39 (H) 07/17/2017 1012   CALCIUM 8.4 (L) 06/23/2018 0446   GFRNONAA 17 (L) 06/23/2018 0446   GFRNONAA 39 (L) 07/17/2017 1012   GFRAA 19 (L) 06/23/2018 0446   GFRAA 45 (L) 07/17/2017 1012   CBC    Component Value Date/Time   WBC 7.5 06/23/2018 0446   RBC 3.51 (L)  06/23/2018 0446   HGB 8.4 (L) 06/23/2018 0446   HGB 9.3 (L) 07/17/2017 1012   HGB 10.1 (L) 01/17/2017 1043   HCT 26.9 (L) 06/23/2018 0446   HCT 31.8 (L) 01/17/2017 1043   PLT 312 06/23/2018 0446   PLT 239 07/17/2017 1012   PLT 227 01/17/2017 1043   MCV 76.6 (L) 06/23/2018 0446   MCV 82.0 01/17/2017 1043   MCH 23.9 (L) 06/23/2018 0446   MCHC 31.2 06/23/2018 0446   RDW 16.8 (H) 06/23/2018 0446   RDW 16.4 (H) 01/17/2017 1043   LYMPHSABS 1.5 07/17/2017 1012   LYMPHSABS 1.6 01/17/2017 1043   MONOABS 0.6 07/17/2017 1012   MONOABS 0.6 01/17/2017 1043   EOSABS 0.2 07/17/2017 1012   EOSABS 0.1 01/17/2017 1043   BASOSABS 0.1 07/17/2017 1012   BASOSABS 0.0 01/17/2017 1043     Assessment/Plan:  1. AKI/CKD stage 3- in setting of decompensated diastolic CHF and concomitant ARB therapy.  Cr baseline around 2 and peaked at 3.12 and slowly improving to 2.85.  Continue to hold losartan and follow UOP and Cr levels 2. Acute on chronic diastolic CHF- responding to diuretics 3. IDDM- per primary svc 4. H/o CVa 5. HTN- stable 6. Anemia of CKD- check iron stores  Donetta Potts, MD [employer] [phone]

## 2018-06-24 ENCOUNTER — Ambulatory Visit (INDEPENDENT_AMBULATORY_CARE_PROVIDER_SITE_OTHER): Payer: Medicare Other | Admitting: *Deleted

## 2018-06-24 DIAGNOSIS — R55 Syncope and collapse: Secondary | ICD-10-CM

## 2018-06-24 LAB — BASIC METABOLIC PANEL
Anion gap: 10 (ref 5–15)
BUN: 37 mg/dL — ABNORMAL HIGH (ref 8–23)
CO2: 23 mmol/L (ref 22–32)
Calcium: 8.6 mg/dL — ABNORMAL LOW (ref 8.9–10.3)
Chloride: 104 mmol/L (ref 98–111)
Creatinine, Ser: 3.14 mg/dL — ABNORMAL HIGH (ref 0.44–1.00)
GFR calc Af Amer: 17 mL/min — ABNORMAL LOW (ref >60)
GFR calc non Af Amer: 15 mL/min — ABNORMAL LOW (ref >60)
Glucose, Bld: 76 mg/dL (ref 70–99)
Potassium: 4.8 mmol/L (ref 3.5–5.1)
Sodium: 137 mmol/L (ref 135–145)

## 2018-06-24 LAB — IRON AND TIBC
Iron: 25 ug/dL — ABNORMAL LOW (ref 28–170)
Saturation Ratios: 9 % — ABNORMAL LOW (ref 10.4–31.8)
TIBC: 272 ug/dL (ref 250–450)
UIBC: 247 ug/dL

## 2018-06-24 LAB — CUP PACEART REMOTE DEVICE CHECK
Date Time Interrogation Session: 20200526100622
Implantable Pulse Generator Implant Date: 20180523

## 2018-06-24 LAB — GLUCOSE, CAPILLARY
Glucose-Capillary: 106 mg/dL — ABNORMAL HIGH (ref 70–99)
Glucose-Capillary: 266 mg/dL — ABNORMAL HIGH (ref 70–99)
Glucose-Capillary: 225 mg/dL — ABNORMAL HIGH (ref 70–99)
Glucose-Capillary: 218 mg/dL — ABNORMAL HIGH (ref 70–99)

## 2018-06-24 LAB — FERRITIN: Ferritin: 7 ng/mL — ABNORMAL LOW (ref 11–307)

## 2018-06-24 MED ORDER — MAGNESIUM OXIDE 400 (241.3 MG) MG PO TABS
400.0000 mg | ORAL_TABLET | Freq: Two times a day (BID) | ORAL | Status: DC
  Administered 2018-06-24 – 2018-06-25 (×3): 400 mg via ORAL
  Filled 2018-06-24 (×3): qty 1

## 2018-06-24 MED ORDER — SODIUM CHLORIDE 0.9 % IV SOLN
510.0000 mg | INTRAVENOUS | Status: DC
  Administered 2018-06-24: 510 mg via INTRAVENOUS
  Filled 2018-06-24: qty 17

## 2018-06-24 MED ORDER — FUROSEMIDE 80 MG PO TABS
80.0000 mg | ORAL_TABLET | Freq: Two times a day (BID) | ORAL | Status: DC
  Administered 2018-06-24 – 2018-06-25 (×2): 80 mg via ORAL
  Filled 2018-06-24 (×2): qty 1

## 2018-06-24 NOTE — Progress Notes (Signed)
PROGRESS NOTE  Julie Brewer KGM:[id number] DOB: 1950-02-06 DOA: 06/20/2018 PCP: [name], MD   LOS: 4 days   Brief narrative: Julie Brewer is a 68 y.o. female with medical history significant of DM2, HTN, asthma, GERD, HLD, PVD, anemia, CVA presented with leg swelling, worsening shortness of breath abdominal swelling, weight gain of 11 pounds and shortness of breath with history of minimal exertion.  Two-pillow orthopnea. Baseline dry weight about 142 pounds. Patient was recently evaluated by vascular surgery secondary to leg swelling and pain right leg Dopplers were done showed no evidence of DVT  Subjective: Patient feels better with breathing her oxygen has been weaned off.  She has been having good urinary output with high-dose Lasix.  Around 5610 ml negative balance.  Denies any fever, cough, chest pain.  Assessment/Plan:  Active Problems:   Peripheral vascular disease (HCC)   Dyslipidemia   Essential hypertension   GERD (gastroesophageal reflux disease)   AKI (acute kidney injury) ([place])   Acute exacerbation of CHF (congestive heart failure) (HCC)   Acute respiratory failure with hypoxia (HCC)   Tobacco abuse  Acute on chronic diastolic CHF exacerbation.  Chest x-ray showed congestive heart failure with left pleural effusion.  2D echocardiogram performed on 06/21/2018 showed ejection fraction more than 65% with impaired relaxation of the left ventricle during diastole.  Function responding with IV diuresis.  Nephrology board due to chronic kidney disease and volume overload.  Continue strict intake and output charting.  Fluid restriction.   Borderline elevation of troponin without chest pain.  EKG was reviewed by me without any acute ischemic changes.    Acute hypoxic respiratory failure on presentation.  Likely secondary to congestive heart failure, pleural effusion.  This has significantly improved with IV diuretics.  She is off oxygen today.  I spoke with  nephrology for further diuresis.  Nephrology has changed IV Lasix to p.o. today.  Nephrology will assess the patient in a.m reguarding further need and dose of diuretics.  Hypomagnesemia.  Continue magnesium oxide  Peripheral vascular disease -chronic currently stable   Dyslipidemia -chronic and currently stable  Essential hypertension continue amlodipine and labetalol.  Blood pressure has improved after initiation of diuretics.  GERD  on protonix.  Acute kidney injury with possible CKD stage III.   Baseline creatinine levels of 2.0.  Creatinine of 3.1 today.  Will need to monitor on diuretics.  Patient was on losartan at home which is on hold.  Nephrology on board.  Will need to check with her nephrology prior to discharge. Patient will need to follow up with Dr Johnney Ou at [facility] on discharge.  Anemia of chronic kidney disease.  No evidence of bleeding.  No need for transfusion  Tobacco abuse. On nicotine patch.   DM2 -  -continue Lantus, sliding scale insulin Accu-Cheks.  Diabetic diet.  Glycemic control is adequate   VTE Prophylaxis: lovenox  Code Status: DNR  Family Communication: None today  Disposition Plan: Home likely in 1 to 2 days.  Continue diuresis.  We will continue to wean oxygen.  Patient has been seen by physical therapy recommend home health PT on discharge.  Consultants:   nephrology  Procedures:   None  Antibiotics: Anti-infectives (From admission, onward)   None      Objective: Vitals:   06/24/18 0852 06/24/18 0855  BP: (!) 144/72   Pulse:  75  Resp: 20 18  Temp:  97.8 F (36.6 C)  SpO2:  100%  Intake/Output Summary (Last 24 hours) at 06/24/2018 1519 Last data filed at 06/24/2018 1400 Gross per 24 hour  Intake 420 ml  Output 1800 ml  Net -1380 ml   Filed Weights   06/22/18 0629 06/23/18 0316 06/24/18 0543  Weight: 68.9 kg 66.4 kg 65.4 kg   Body mass index is 25.32 kg/m.   Physical Exam: GENERAL: Patient is  alert awake and oriented. Not in obvious distress.  On nasal cannula oxygen. Legally blind HENT: No scleral pallor or icterus. Pupils equally reactive to light. Oral mucosa is moist NECK: is supple, no palpable thyroid enlargement. CHEST: Diminished breath sounds bilaterally. CVS: S1 and S2 heard, no murmur. Regular rate and rhythm. No pericardial rub. ABDOMEN: Soft, non-tender, bowel sounds are present. No palpable hepato-splenomegaly. EXTREMITIES:  bilateral lower extremity edema. CNS: Cranial nerves are intact. No focal motor or sensory deficits. SKIN: warm and dry without rashes.  Data Review: I have personally reviewed the following laboratory data and studies,  CBC: Recent Labs  Lab 06/20/18 1533 06/20/18 1737 06/21/18 0731 06/22/18 0659 06/23/18 0446  WBC 9.5  --  9.1 9.0 7.5  HGB 10.1* 10.2* 8.3* 8.0* 8.4*  HCT 33.1* 30.0* 26.0* 25.4* 26.9*  MCV 80.3  --  76.7* 76.5* 76.6*  PLT 360  --  326 305 341   Basic Metabolic Panel: Recent Labs  Lab 06/20/18 1533 06/20/18 1737 06/21/18 0731 06/22/18 0659 06/23/18 0446 06/24/18 0349  NA 138 138 139 137 137 137  K 4.9 5.0 4.9 4.9 4.5 4.8  CL 110  --  109 108 106 104  CO2 13*  --  17* 19* 19* 23  GLUCOSE 72  --  68* 101* 92 76  BUN 29*  --  30* 34* 33* 37*  CREATININE 3.11*  --  3.12* 3.06* 2.86* 3.14*  CALCIUM 8.7*  --  8.4* 8.1* 8.4* 8.6*  MG  --   --  1.7 1.7 1.6*  --   PHOS  --   --  4.7*  --   --   --    Liver Function Tests: Recent Labs  Lab 06/21/18 0731  AST 11*  ALT 9  ALKPHOS 82  BILITOT 0.4  PROT 5.6*  ALBUMIN 2.4*   No results for input(s): LIPASE, AMYLASE in the last 168 hours. No results for input(s): AMMONIA in the last 168 hours. Cardiac Enzymes: Recent Labs  Lab 06/20/18 1533 06/20/18 2143 06/21/18 0241 06/21/18 0731  TROPONINI <0.03 <0.03 <0.03 <0.03   BNP (last 3 results) Recent Labs    06/20/18 1723 06/21/18 1533  BNP 170.8* 93.4    ProBNP (last 3 results) No results for  input(s): PROBNP in the last 8760 hours.  CBG: Recent Labs  Lab 06/23/18 1121 06/23/18 1647 06/23/18 2137 06/24/18 0631 06/24/18 1106  GLUCAP 274* 263* 284* 106* 266*   Recent Results (from the past 240 hour(s))  SARS Coronavirus 2 (CEPHEID- Performed in [place] hospital lab), Hosp Order     Status: None   Collection Time: 06/20/18  5:22 PM  Result Value Ref Range Status   SARS Coronavirus 2 NEGATIVE NEGATIVE Final    Comment: (NOTE) If result is NEGATIVE SARS-CoV-2 target nucleic acids are NOT DETECTED. The SARS-CoV-2 RNA is generally detectable in upper and lower  respiratory specimens during the acute phase of infection. The lowest  concentration of SARS-CoV-2 viral copies this assay can detect is 250  copies / mL. A negative result does not preclude SARS-CoV-2 infection  and should  not be used as the sole basis for treatment or other  patient management decisions.  A negative result may occur with  improper specimen collection / handling, submission of specimen other  than nasopharyngeal swab, presence of viral mutation(s) within the  areas targeted by this assay, and inadequate number of viral copies  (<250 copies / mL). A negative result must be combined with clinical  observations, patient history, and epidemiological information. If result is POSITIVE SARS-CoV-2 target nucleic acids are DETECTED. The SARS-CoV-2 RNA is generally detectable in upper and lower  respiratory specimens dur ing the acute phase of infection.  Positive  results are indicative of active infection with SARS-CoV-2.  Clinical  correlation with patient history and other diagnostic information is  necessary to determine patient infection status.  Positive results do  not rule out bacterial infection or co-infection with other viruses. If result is PRESUMPTIVE POSTIVE SARS-CoV-2 nucleic acids MAY BE PRESENT.   A presumptive positive result was obtained on the submitted specimen  and confirmed  on repeat testing.  While 2019 novel coronavirus  (SARS-CoV-2) nucleic acids may be present in the submitted sample  additional confirmatory testing may be necessary for epidemiological  and / or clinical management purposes  to differentiate between  SARS-CoV-2 and other Sarbecovirus currently known to infect humans.  If clinically indicated additional testing with an alternate test  methodology [phone]) is advised. The SARS-CoV-2 RNA is generally  detectable in upper and lower respiratory sp ecimens during the acute  phase of infection. The expected result is Negative. Fact Sheet for Patients:  StrictlyIdeas.no Fact Sheet for Healthcare Providers: BankingDealers.co.za This test is not yet approved or cleared by the [place] FDA and has been authorized for detection and/or diagnosis of SARS-CoV-2 by FDA under an Emergency Use Authorization (EUA).  This EUA will remain in effect (meaning this test can be used) for the duration of the COVID-19 declaration under Section 564(b)(1) of the Act, 21 U.S.C. section 360bbb-3(b)(1), unless the authorization is terminated or revoked sooner. Performed at [facility], [place] [address] [place], [place] [address]      Studies: US Renal  Result Date: 06/22/2018 CLINICAL DATA:  Acute on chronic renal failure. EXAM: RENAL / URINARY TRACT ULTRASOUND COMPLETE COMPARISON:  Abdomen CT 06/09/2017. FINDINGS: Right Kidney: Renal measurements: 10.8 x 5.3 x 6.3 cm = volume: 189 mL. Increased echogenicity without hydronephrosis. Left Kidney: Renal measurements: 9.5 x 4.8 x 5.7 cm = volume: 136 mL. Diffusely increased echogenicity without hydronephrosis. Bladder: Appears normal for degree of bladder distention. IMPRESSION: 1. Increased echogenicity of both kidneys, suggesting medical renal disease. 2. No hydronephrosis. Electronically Signed   By: Misty Stanley M.D.   On: 06/22/2018 20:09    Scheduled  Meds: . amLODipine  10 mg Oral Q48H  . aspirin  325 mg Oral Daily  . atorvastatin  20 mg Oral Daily  . cilostazol  100 mg Oral BID  . enoxaparin (LOVENOX) injection  30 mg Subcutaneous QHS  . furosemide  80 mg Oral BID  . gabapentin  100 mg Oral BID  . insulin aspart  0-9 Units Subcutaneous TID AC & HS  . insulin glargine  5 Units Subcutaneous Daily  . labetalol  100 mg Oral BID  . magnesium oxide  400 mg Oral BID  . metolazone  2.5 mg Oral BID  . nicotine  21 mg Transdermal Daily  . pantoprazole  40 mg Oral Daily  . senna  1 tablet Oral BID  .  sodium chloride flush  3 mL Intravenous Q12H    Continuous Infusions: . sodium chloride    . ferumoxytol       Flora Lipps, MD  [facility] 06/24/2018

## 2018-06-24 NOTE — Progress Notes (Signed)
Patient ID: Julie Brewer, female   DOB: 05/20/1950, 68 y.o.   MRN: [id number] S: Feels well, no complaints O:BP (!) 144/72   Pulse 75   Temp 97.8 F (36.6 C) (Oral)   Resp 18   Ht 5' 3.25" (1.607 m)   Wt 65.4 kg   SpO2 100%   BMI 25.32 kg/m   Intake/Output Summary (Last 24 hours) at 06/24/2018 1309 Last data filed at 06/24/2018 0918 Gross per 24 hour  Intake 380 ml  Output 1200 ml  Net -820 ml   Intake/Output: I/O last 3 completed shifts: In: 550 [P.O.:550] Out: 3100 [Urine:3100]  Intake/Output this shift:  Total I/O In: 180 [P.O.:180] Out: -  Weight change: -1.043 kg Gen: NAD CVS: no rub Resp: cta Abd: benign Ext: no edema  Recent Labs  Lab 06/20/18 1533 06/20/18 1737 06/21/18 0731 06/22/18 0659 06/23/18 0446 06/24/18 0349  NA 138 138 139 137 137 137  K 4.9 5.0 4.9 4.9 4.5 4.8  CL 110  --  109 108 106 104  CO2 13*  --  17* 19* 19* 23  GLUCOSE 72  --  68* 101* 92 76  BUN 29*  --  30* 34* 33* 37*  CREATININE 3.11*  --  3.12* 3.06* 2.86* 3.14*  ALBUMIN  --   --  2.4*  --   --   --   CALCIUM 8.7*  --  8.4* 8.1* 8.4* 8.6*  PHOS  --   --  4.7*  --   --   --   AST  --   --  11*  --   --   --   ALT  --   --  9  --   --   --    Liver Function Tests: Recent Labs  Lab 06/21/18 0731  AST 11*  ALT 9  ALKPHOS 82  BILITOT 0.4  PROT 5.6*  ALBUMIN 2.4*   No results for input(s): LIPASE, AMYLASE in the last 168 hours. No results for input(s): AMMONIA in the last 168 hours. CBC: Recent Labs  Lab 06/20/18 1533  06/21/18 0731 06/22/18 0659 06/23/18 0446  WBC 9.5  --  9.1 9.0 7.5  HGB 10.1*   < > 8.3* 8.0* 8.4*  HCT 33.1*   < > 26.0* 25.4* 26.9*  MCV 80.3  --  76.7* 76.5* 76.6*  PLT 360  --  326 305 312   < > = values in this interval not displayed.   Cardiac Enzymes: Recent Labs  Lab 06/20/18 1533 06/20/18 2143 06/21/18 0241 06/21/18 0731  TROPONINI <0.03 <0.03 <0.03 <0.03   CBG: Recent Labs  Lab 06/23/18 1121 06/23/18 1647  06/23/18 2137 06/24/18 0631 06/24/18 1106  GLUCAP 274* 263* 284* 106* 266*    Iron Studies:  Recent Labs    06/24/18 0349  IRON 25*  TIBC 272  FERRITIN 7*   Studies/Results: US Renal  Result Date: 06/22/2018 CLINICAL DATA:  Acute on chronic renal failure. EXAM: RENAL / URINARY TRACT ULTRASOUND COMPLETE COMPARISON:  Abdomen CT 06/09/2017. FINDINGS: Right Kidney: Renal measurements: 10.8 x 5.3 x 6.3 cm = volume: 189 mL. Increased echogenicity without hydronephrosis. Left Kidney: Renal measurements: 9.5 x 4.8 x 5.7 cm = volume: 136 mL. Diffusely increased echogenicity without hydronephrosis. Bladder: Appears normal for degree of bladder distention. IMPRESSION: 1. Increased echogenicity of both kidneys, suggesting medical renal disease. 2. No hydronephrosis. Electronically Signed   By: Misty Stanley M.D.   On: 06/22/2018 20:09   .  amLODipine  10 mg Oral Q48H  . aspirin  325 mg Oral Daily  . atorvastatin  20 mg Oral Daily  . cilostazol  100 mg Oral BID  . enoxaparin (LOVENOX) injection  30 mg Subcutaneous QHS  . furosemide  80 mg Intravenous BID  . gabapentin  100 mg Oral BID  . insulin aspart  0-9 Units Subcutaneous TID AC & HS  . insulin glargine  5 Units Subcutaneous Daily  . labetalol  100 mg Oral BID  . magnesium oxide  400 mg Oral BID  . metolazone  2.5 mg Oral BID  . nicotine  21 mg Transdermal Daily  . pantoprazole  40 mg Oral Daily  . senna  1 tablet Oral BID  . sodium chloride flush  3 mL Intravenous Q12H    BMET    Component Value Date/Time   NA 137 06/24/2018 0349   K 4.8 06/24/2018 0349   CL 104 06/24/2018 0349   CO2 23 06/24/2018 0349   GLUCOSE 76 06/24/2018 0349   BUN 37 (H) 06/24/2018 0349   CREATININE 3.14 (H) 06/24/2018 0349   CREATININE 1.39 (H) 07/17/2017 1012   CALCIUM 8.6 (L) 06/24/2018 0349   GFRNONAA 15 (L) 06/24/2018 0349   GFRNONAA 39 (L) 07/17/2017 1012   GFRAA 17 (L) 06/24/2018 0349   GFRAA 45 (L) 07/17/2017 1012   CBC    Component Value  Date/Time   WBC 7.5 06/23/2018 0446   RBC 3.51 (L) 06/23/2018 0446   HGB 8.4 (L) 06/23/2018 0446   HGB 9.3 (L) 07/17/2017 1012   HGB 10.1 (L) 01/17/2017 1043   HCT 26.9 (L) 06/23/2018 0446   HCT 31.8 (L) 01/17/2017 1043   PLT 312 06/23/2018 0446   PLT 239 07/17/2017 1012   PLT 227 01/17/2017 1043   MCV 76.6 (L) 06/23/2018 0446   MCV 82.0 01/17/2017 1043   MCH 23.9 (L) 06/23/2018 0446   MCHC 31.2 06/23/2018 0446   RDW 16.8 (H) 06/23/2018 0446   RDW 16.4 (H) 01/17/2017 1043   LYMPHSABS 1.5 07/17/2017 1012   LYMPHSABS 1.6 01/17/2017 1043   MONOABS 0.6 07/17/2017 1012   MONOABS 0.6 01/17/2017 1043   EOSABS 0.2 07/17/2017 1012   EOSABS 0.1 01/17/2017 1043   BASOSABS 0.1 07/17/2017 1012   BASOSABS 0.0 01/17/2017 1043     Assessment/Plan:  1. AKI/CKD stage 3- in setting of decompensated diastolic CHF and concomitant ARB therapy.  Cr baseline around 2 and peaked at 3.12 and slowly improving to 2.85.   1. No significant change in renal function 2. Continue to hold losartan and follow UOP and Cr levels 2. Acute on chronic diastolic CHF- responding to diuretics 1. Ok to change to po lasix and see how she does 3. IDDM- per primary svc 4. H/o CVa 5. HTN- stable 6. Anemia of CKD- low iron stores, will give IV feraheme and follow.  She will likely require ESA treatments as an outpatient.  She will f/u with Dr. Johnney Ou in our office after discharge  Donetta Potts, MD [facility] [phone]

## 2018-06-24 NOTE — Plan of Care (Signed)
  Problem: Health Behavior/Discharge Planning: Goal: Ability to manage health-related needs will improve Outcome: Progressing   Problem: Clinical Measurements: Goal: Ability to maintain clinical measurements within normal limits will improve Outcome: Progressing   

## 2018-06-24 NOTE — Progress Notes (Signed)
Physical Therapy Treatment Patient Details Name: Julie Brewer MRN: [id number] DOB: May 27, 1950 Today's Date: 06/24/2018    History of Present Illness [name] is a 68 y.o. female with medical history significant of DM2, HTN, asthma, GERD, HLD, PVD, anemia, CVA presented with leg swelling, worsening shortness of breath abdominal swelling, weight gain of 11 pounds and shortness of breath with history of minimal exertion.     PT Comments    Pt agreeable to therapy today. Pt is making good progress towards her goals. Pt currently is independent with transfers and min guard for ambulation. Pt able to hold onto PT R elbow for guidance. Pt with 2/4 DoE with ambulation, with SaO2 >90% on RW. Pt is hopeful for d/c home tomorrow. D/c plans remain appropriate at this time.     Follow Up Recommendations  Home health PT;Supervision - Intermittent     Equipment Recommendations  None recommended by PT       Precautions / Restrictions Precautions Precautions: Fall Precaution Comments: pt is blind Restrictions Weight Bearing Restrictions: No    Mobility  Bed Mobility               General bed mobility comments: pt up in chair upon PT arrival  Transfers Overall transfer level: Independent Equipment used: None             General transfer comment: pt pushed up cautiously from arm rests of chair, steady  Ambulation/Gait Ambulation/Gait assistance: Min guard Gait Distance (Feet): 400 Feet Assistive device: None(held on back of PTs arm with L UE due to being blind) Gait Pattern/deviations: Decreased stride length;Step-through pattern     General Gait Details: SaO2 on RA >90% throughout ambulation, mild SoB by end of       Balance Overall balance assessment: Needs assistance Sitting-balance support: No upper extremity supported;Feet supported Sitting balance-Leahy Scale: Good     Standing balance support: No upper extremity supported;During functional  activity Standing balance-Leahy Scale: Fair                              Cognition Arousal/Alertness: Awake/alert Behavior During Therapy: WFL for tasks assessed/performed Overall Cognitive Status: Within Functional Limits for tasks assessed                                               Pertinent Vitals/Pain Pain Assessment: No/denies pain           PT Goals (current goals can now be found in the care plan section) Acute Rehab PT Goals Patient Stated Goal: go home PT Goal Formulation: With patient Time For Goal Achievement: 07/05/18 Potential to Achieve Goals: Good Progress towards PT goals: Progressing toward goals    Frequency    Min 3X/week      PT Plan Current plan remains appropriate       AM-PAC PT "6 Clicks" Mobility   Outcome Measure  Help needed turning from your back to your side while in a flat bed without using bedrails?: None Help needed moving from lying on your back to sitting on the side of a flat bed without using bedrails?: None Help needed moving to and from a bed to a chair (including a wheelchair)?: None Help needed standing up from a chair using your arms (e.g., wheelchair or bedside chair)?: None Help  needed to walk in hospital room?: A Little Help needed climbing 3-5 steps with a railing? : A Little 6 Click Score: 22    End of Session Equipment Utilized During Treatment: Gait belt Activity Tolerance: Patient tolerated treatment well Patient left: in chair;with call bell/phone within reach Nurse Communication: Mobility status PT Visit Diagnosis: Muscle weakness (generalized) (M62.81);Other abnormalities of gait and mobility (R26.89)     Time: [id number] PT Time Calculation (min) (ACUTE ONLY): 15 min  Charges:  $Gait Training: 8-22 mins                     Keyonte Cookston B. Migdalia Dk PT, DPT Acute Rehabilitation Services Pager [phone] Office [phone]    [place] 06/24/2018,  1:21 PM

## 2018-06-24 NOTE — Progress Notes (Signed)
Inpatient Diabetes Program Recommendations  AACE/ADA: New Consensus Statement on Inpatient Glycemic Control (2015)  Target Ranges:  Prepandial:   less than 140 mg/dL      Peak postprandial:   less than 180 mg/dL (1-2 hours)      Critically ill patients:  140 - 180 mg/dL   Results for MYLIN, GIGNAC (MRN [id number]) as of 06/24/2018 10:14  Ref. Range 06/23/2018 06:25 06/23/2018 11:21 06/23/2018 16:47 06/23/2018 21:37 06/24/2018 06:31  Glucose-Capillary Latest Ref Range: 70 - 99 mg/dL 105 (H) 274 (H) 263 (H) 284 (H) 106 (H)    Review of Glycemic Control  Diabetes history: DM 2 Outpatient Diabetes medications: Lantus 16 units, Novolog 10 units breakfast, 5 units supper, Metformin 750 mg Daily Current orders for Inpatient glycemic control: Lantus 5 units Daily, Novolog 0-9 units 4x/day  Inpatient Diabetes Program Recommendations:    Glucose trends increase after meals. Patient takes meal coverage at home.   Please consider adding Novolog 4 units tid if patient consumes at least 50% of meals.  Thanks,  Tama Headings RN, MSN, BC-ADM Inpatient Diabetes Coordinator Team Pager [phone] (8a-5p)

## 2018-06-25 LAB — RENAL FUNCTION PANEL
Albumin: 2.7 g/dL — ABNORMAL LOW (ref 3.5–5.0)
Anion gap: 14 (ref 5–15)
BUN: 40 mg/dL — ABNORMAL HIGH (ref 8–23)
CO2: 21 mmol/L — ABNORMAL LOW (ref 22–32)
Calcium: 8.6 mg/dL — ABNORMAL LOW (ref 8.9–10.3)
Chloride: 98 mmol/L (ref 98–111)
Creatinine, Ser: 3.08 mg/dL — ABNORMAL HIGH (ref 0.44–1.00)
GFR calc Af Amer: 18 mL/min — ABNORMAL LOW (ref >60)
GFR calc non Af Amer: 15 mL/min — ABNORMAL LOW (ref >60)
Glucose, Bld: 105 mg/dL — ABNORMAL HIGH (ref 70–99)
Phosphorus: 6.1 mg/dL — ABNORMAL HIGH (ref 2.5–4.6)
Potassium: 4.3 mmol/L (ref 3.5–5.1)
Sodium: 133 mmol/L — ABNORMAL LOW (ref 135–145)

## 2018-06-25 LAB — GLUCOSE, CAPILLARY
Glucose-Capillary: 132 mg/dL — ABNORMAL HIGH (ref 70–99)
Glucose-Capillary: 229 mg/dL — ABNORMAL HIGH (ref 70–99)

## 2018-06-25 MED ORDER — PANTOPRAZOLE SODIUM 40 MG PO TBEC: 40.0000 mg | DELAYED_RELEASE_TABLET | Freq: Every day | ORAL | 0 refills | Status: DC

## 2018-06-25 MED ORDER — POLYETHYLENE GLYCOL 3350 17 G PO PACK: 17.0000 g | PACK | Freq: Every day | ORAL | 0 refills | Status: DC | PRN

## 2018-06-25 MED ORDER — SENNOSIDES-DOCUSATE SODIUM 8.6-50 MG PO TABS: 1.0000 | ORAL_TABLET | Freq: Every evening | ORAL | 0 refills | Status: AC | PRN

## 2018-06-25 MED ORDER — FUROSEMIDE 80 MG PO TABS: 80.0000 mg | ORAL_TABLET | Freq: Every day | ORAL | 0 refills | Status: DC

## 2018-06-25 MED ORDER — FUROSEMIDE 80 MG PO TABS: 80.0000 mg | ORAL_TABLET | Freq: Every day | ORAL | Status: DC

## 2018-06-25 NOTE — Discharge Summary (Signed)
Physician Discharge Summary  Symphonie Schneiderman Ayers-Farrar JHE:[id number] DOB: [birth year] DOA: 06/20/2018  PCP: [name], MD  Admit date: 06/20/2018 Discharge date: 06/25/2018  Admitted From:Home Disposition: Home  Recommendations for Outpatient Follow-up:  1. Follow up with PCP in 1-2 weeks 2. Please obtain BMP/CBC in one week your next doctors visit.  3. Oral Lasix daily 4. Discontinue losartan for now  Home Health: PT Equipment/Devices: None Discharge Condition: Stable CODE STATUS: DNR Diet recommendation: Cardiac/renal  Brief/Interim Summary: 68 year old with history of diabetes mellitus type 2, essential hypertension, asthma, GERD, peripheral vascular disease, anemia, CVA came to the hospital with complaints of progressive dyspnea, lower extremity swelling and weight gain.  She also reported of two-pillow orthopnea.  Lower extremity Dopplers was negative for DVT.  She was aggressively diuresed but it was complicated by acute kidney injury.  Over the course of several days her breathing symptoms significantly improved and returned to baseline.  Her creatinine remained elevated at 3.1 from baseline of 2.0.  Nephrology team was consulted who recommended monitoring it and discontinue nephrotoxic drugs.  She was deemed stable to be discharged with outpatient follow-up.   Discharge Diagnoses:  Active Problems:   Peripheral vascular disease (HCC)   Dyslipidemia   Essential hypertension   GERD (gastroesophageal reflux disease)   AKI (acute kidney injury) ([place])   Acute exacerbation of CHF (congestive heart failure) (HCC)   Acute respiratory failure with hypoxia (HCC)   Tobacco abuse  Acute on chronic diastolic congestive heart failure, ejection fraction 65% -Today patient appears to be euvolemic in nature.  Echocardiogram 5/23 showed ejection fraction 65%.  Responded well to IV diuretics, will transition to p.o. oral Lasix daily.  Monitor renal function.  Will need outpatient lab work in  about 1 week.  Follow-up outpatient cardiology in the next 2-4 weeks  Acute hypoxic respiratory distress secondary to pleural effusion - This is resolved with IV diuresis.  Acute kidney injury on CKD stage III -Baseline creatinine 2.0, today still 3.0 but secondary to aggressive diuresis.  Nephrology recommends discontinuing losartan for now and discharging patient on oral Lasix.  Patient will need outpatient lab work in about 1 week.  GERD -PPI  Anemia of chronic disease -No need for transfusion.  Hemoglobin stable  Diabetes mellitus type 2 -Continue home medication including Lantus.  On Lovenox for DVT prophylaxis while here.  DNR status. Discharge today.  Consultations:  Nephrology  Subjective: Patient denies any complaints, she is eager to be discharged today as her respiratory symptoms is returned to baseline.  Discharge Exam: Vitals:   06/25/18 0530 06/25/18 0901  BP: 122/66 (!) 143/74  Pulse: 80 82  Resp: 13   Temp: 97.9 F (36.6 C)   SpO2: 99%    Vitals:   06/24/18 2011 06/24/18 2328 06/25/18 0530 06/25/18 0901  BP: 138/76 117/65 122/66 (!) 143/74  Pulse: 78 80 80 82  Resp: 16 13 13    Temp: 97.8 F (36.6 C) 97.8 F (36.6 C) 97.9 F (36.6 C)   TempSrc: Oral Oral Oral   SpO2: 98% 98% 99%   Weight:   65 kg   Height:        General: Pt is alert, awake, not in acute distress Cardiovascular: RRR, S1/S2 +, no rubs, no gallops Respiratory: CTA bilaterally, no wheezing, no rhonchi Abdominal: Soft, NT, ND, bowel sounds + Extremities: no edema, no cyanosis  Discharge Instructions   Allergies as of 06/25/2018      Reactions   Morphine And Related Other (See  Comments)   Hallucenations    Penicillins Rash, Other (See Comments)   Swelling      Medication List    STOP taking these medications   losartan 25 MG tablet Commonly known as:  COZAAR     TAKE these medications   aspirin 325 MG EC tablet Take 325 mg by mouth daily.   atorvastatin 20 MG  tablet Commonly known as:  LIPITOR Take 1 tablet by mouth once daily   cilostazol 100 MG tablet Commonly known as:  PLETAL Take 1 tablet by mouth twice daily   diltiazem 360 MG 24 hr capsule Commonly known as:  CARDIZEM CD Take 1 capsule (360 mg total) by mouth daily.   furosemide 80 MG tablet Commonly known as:  LASIX Take 1 tablet (80 mg total) by mouth daily for 30 days. Start taking on:  Jun 26, 2018   gabapentin 100 MG capsule Commonly known as:  NEURONTIN Take 1 capsule by mouth twice daily   glucose blood test strip Commonly known as:  FREESTYLE LITE Use as instructed to check blood sugar 1 times a day dx code E11.65   indapamide 1.25 MG tablet Commonly known as:  LOZOL Take 1 tablet (1.25 mg total) by mouth daily.   insulin glargine 100 UNIT/ML injection Commonly known as:  LANTUS Inject 0.14 mLs (14 Units total) into the skin daily. What changed:    how much to take  additional instructions   IRON PO Take 1 tablet by mouth 2 (two) times a day.   labetalol 100 MG tablet Commonly known as:  NORMODYNE Take 1 tablet (100 mg total) by mouth 2 (two) times daily.   metFORMIN 750 MG 24 hr tablet Commonly known as:  GLUCOPHAGE-XR Take 1 tablet by mouth once daily   NovoLIN R ReliOn 100 units/mL injection Generic drug:  insulin regular Inject into the skin 2 (two) times daily before a meal. INJECT 10 UNITS UNDER THE SKIN AT BREAKFAST, AND 5 UNITS IN THE EVENING.   omeprazole 20 MG capsule Commonly known as:  PRILOSEC TAKE 1 CAPSULE BY MOUTH ONCE DAILY (DUE  FOR  OFFICE  VISIT  THIS  MONTH)   pantoprazole 40 MG tablet Commonly known as:  PROTONIX Take 1 tablet (40 mg total) by mouth daily for 30 days. Start taking on:  Jun 26, 2018   polyethylene glycol 17 g packet Commonly known as:  MIRALAX / GLYCOLAX Take 17 g by mouth daily as needed for moderate constipation.   senna-docusate 8.6-50 MG tablet Commonly known as:  Senokot-S Take 1 tablet by mouth  at bedtime as needed for up to 30 days for mild constipation.   Vitamin D (Ergocalciferol) 1.25 MG (50000 UT) Caps capsule Commonly known as:  DRISDOL TAKE ONE CAPSULE BY MOUTH EVERY 7 DAYS What changed:  See the new instructions.      Follow-up Information    [name], MD. Schedule an appointment as soon as possible for a visit in 1 week(s).   Specialty:  Family Medicine Contact information: [place] [address] [phone]        Lorretta Harp, MD .   Specialties:  Cardiology, Radiology Contact information: [address] [place] [address] [place] [address] [phone]          Allergies  Allergen Reactions  . Morphine And Related Other (See Comments)    Hallucenations   . Penicillins Rash and Other (See Comments)    Swelling  You were cared for by a hospitalist during your hospital stay. If you have any questions about your discharge medications or the care you received while you were in the hospital after you are discharged, you can call the unit and asked to speak with the hospitalist on call if the hospitalist that took care of you is not available. Once you are discharged, your primary care physician will handle any further medical issues. Please note that no refills for any discharge medications will be authorized once you are discharged, as it is imperative that you return to your primary care physician (or establish a relationship with a primary care physician if you do not have one) for your aftercare needs so that they can reassess your need for medications and monitor your lab values.   Procedures/Studies: Dg Chest 2 View  Result Date: 06/20/2018 CLINICAL DATA:  Short of breath EXAM: CHEST - 2 VIEW COMPARISON:  06/08/2017 FINDINGS: Interval development of prominent interstitial markings bilaterally compatible with edema. Bilateral pleural effusions have developed since the prior study with bibasilar atelectasis.  Cardiac loop recorder noted. IMPRESSION: Interval development of congestive heart failure with interstitial edema and bilateral pleural effusions. Electronically Signed   By: Franchot Gallo M.D.   On: 06/20/2018 16:36   Dg Abd 1 View  Result Date: 06/21/2018 CLINICAL DATA:  68 year old female with history of abdominal distension. EXAM: ABDOMEN - 1 VIEW COMPARISON:  Abdominal radiograph 06/09/2017. FINDINGS: Previously noted nasogastric tube has been removed. Gas and stool are seen scattered throughout the colon extending to the level of the distal rectum. No pathologic distension of small bowel is noted. No gross evidence of pneumoperitoneum. IMPRESSION: 1. Nonobstructive bowel gas pattern. 2. No pneumoperitoneum. Electronically Signed   By: Vinnie Langton M.D.   On: 06/21/2018 09:12   US Renal  Result Date: 06/22/2018 CLINICAL DATA:  Acute on chronic renal failure. EXAM: RENAL / URINARY TRACT ULTRASOUND COMPLETE COMPARISON:  Abdomen CT 06/09/2017. FINDINGS: Right Kidney: Renal measurements: 10.8 x 5.3 x 6.3 cm = volume: 189 mL. Increased echogenicity without hydronephrosis. Left Kidney: Renal measurements: 9.5 x 4.8 x 5.7 cm = volume: 136 mL. Diffusely increased echogenicity without hydronephrosis. Bladder: Appears normal for degree of bladder distention. IMPRESSION: 1. Increased echogenicity of both kidneys, suggesting medical renal disease. 2. No hydronephrosis. Electronically Signed   By: Misty Stanley M.D.   On: 06/22/2018 20:09   Vas [place] Lower Extremity Venous (dvt)  Addendum Date: 06/19/2018   Examination was performed at [place] [facility] by Ellen Henri RVT. Delorise Shiner Electronically Amended 06/19/2018, 2:01 PM   Final Loreli Dollar)    Result Date: 06/19/2018  Lower Venous Study Indications: Edema, Swelling, and Pain.  Examination Guidelines: A complete evaluation includes B-mode imaging, spectral Doppler, color Doppler, and power Doppler as needed of all accessible  portions of each vessel. Bilateral testing is considered an integral part of a complete examination. Limited examinations for reoccurring indications may be performed as noted.  +---------+---------------+---------+-----------+----------+-------+ RIGHT    CompressibilityPhasicitySpontaneityPropertiesSummary +---------+---------------+---------+-----------+----------+-------+ CFV      Full           Yes      Yes                          +---------+---------------+---------+-----------+----------+-------+ SFJ      Full                    Yes                          +---------+---------------+---------+-----------+----------+-------+  FV Prox  Full                    Yes                          +---------+---------------+---------+-----------+----------+-------+ FV Mid   Full                    Yes                          +---------+---------------+---------+-----------+----------+-------+ FV DistalFull                    Yes                          +---------+---------------+---------+-----------+----------+-------+ POP      Full                    Yes                          +---------+---------------+---------+-----------+----------+-------+ PTV      Full                    Yes                          +---------+---------------+---------+-----------+----------+-------+ PERO     Full                    Yes                          +---------+---------------+---------+-----------+----------+-------+ GSV      Full                    Yes                          +---------+---------------+---------+-----------+----------+-------+ SSV      Full                    Yes                          +---------+---------------+---------+-----------+----------+-------+   +----+---------------+---------+-----------+----------+-------+ LEFTCompressibilityPhasicitySpontaneityPropertiesSummary  +----+---------------+---------+-----------+----------+-------+ CFV Full           Yes      Yes                          +----+---------------+---------+-----------+----------+-------+     Summary: Right: There is no evidence of deep vein thrombosis in the lower extremity. There is no evidence of superficial venous thrombosis. No cystic structure found in the popliteal fossa. Common femoral vein Doppler signals were pulsatile bilaterally. Left: No evidence of common femoral vein obstruction.  *See table(s) above for measurements and observations. Electronically signed by Ruta Hinds MD on 06/19/2018 at 11:29:15 AM.    Final Loreli Dollar)       The results of significant diagnostics from this hospitalization (including imaging, microbiology, ancillary and laboratory) are listed below for reference.     Microbiology: Recent Results (from the past 240 hour(s))  SARS Coronavirus 2 (CEPHEID- Performed in [place] hospital lab), Hosp Order     Status: None   Collection Time: 06/20/18  5:22 PM  Result Value Ref Range Status   SARS Coronavirus 2 NEGATIVE NEGATIVE Final    Comment: (NOTE) If result is NEGATIVE SARS-CoV-2 target nucleic acids are NOT DETECTED. The SARS-CoV-2 RNA is generally detectable in upper and lower  respiratory specimens during the acute phase of infection. The lowest  concentration of SARS-CoV-2 viral copies this assay can detect is 250  copies / mL. A negative result does not preclude SARS-CoV-2 infection  and should not be used as the sole basis for treatment or other  patient management decisions.  A negative result may occur with  improper specimen collection / handling, submission of specimen other  than nasopharyngeal swab, presence of viral mutation(s) within the  areas targeted by this assay, and inadequate number of viral copies  (<250 copies / mL). A negative result must be combined with clinical  observations, patient history, and epidemiological  information. If result is POSITIVE SARS-CoV-2 target nucleic acids are DETECTED. The SARS-CoV-2 RNA is generally detectable in upper and lower  respiratory specimens dur ing the acute phase of infection.  Positive  results are indicative of active infection with SARS-CoV-2.  Clinical  correlation with patient history and other diagnostic information is  necessary to determine patient infection status.  Positive results do  not rule out bacterial infection or co-infection with other viruses. If result is PRESUMPTIVE POSTIVE SARS-CoV-2 nucleic acids MAY BE PRESENT.   A presumptive positive result was obtained on the submitted specimen  and confirmed on repeat testing.  While 2019 novel coronavirus  (SARS-CoV-2) nucleic acids may be present in the submitted sample  additional confirmatory testing may be necessary for epidemiological  and / or clinical management purposes  to differentiate between  SARS-CoV-2 and other Sarbecovirus currently known to infect humans.  If clinically indicated additional testing with an alternate test  methodology [phone]) is advised. The SARS-CoV-2 RNA is generally  detectable in upper and lower respiratory sp ecimens during the acute  phase of infection. The expected result is Negative. Fact Sheet for Patients:  StrictlyIdeas.no Fact Sheet for Healthcare Providers: BankingDealers.co.za This test is not yet approved or cleared by the [place] FDA and has been authorized for detection and/or diagnosis of SARS-CoV-2 by FDA under an Emergency Use Authorization (EUA).  This EUA will remain in effect (meaning this test can be used) for the duration of the COVID-19 declaration under Section 564(b)(1) of the Act, 21 U.S.C. section 360bbb-3(b)(1), unless the authorization is terminated or revoked sooner. Performed at [facility], [place] [address] [place], [place] [address]      Labs: BNP (last 3  results) Recent Labs    06/20/18 1723 06/21/18 1533  BNP 170.8* 95.6   Basic Metabolic Panel: Recent Labs  Lab 06/21/18 0731 06/22/18 0659 06/23/18 0446 06/24/18 0349 06/25/18 0240  NA 139 137 137 137 133*  K 4.9 4.9 4.5 4.8 4.3  CL 109 108 106 104 98  CO2 17* 19* 19* 23 21*  GLUCOSE 68* 101* 92 76 105*  BUN 30* 34* 33* 37* 40*  CREATININE 3.12* 3.06* 2.86* 3.14* 3.08*  CALCIUM 8.4* 8.1* 8.4* 8.6* 8.6*  MG 1.7 1.7 1.6*  --   --   PHOS 4.7*  --   --   --  6.1*   Liver Function Tests: Recent Labs  Lab 06/21/18 0731 06/25/18 0240  AST 11*  --   ALT 9  --   ALKPHOS 82  --   BILITOT 0.4  --  PROT 5.6*  --   ALBUMIN 2.4* 2.7*   No results for input(s): LIPASE, AMYLASE in the last 168 hours. No results for input(s): AMMONIA in the last 168 hours. CBC: Recent Labs  Lab 06/20/18 1533 06/20/18 1737 06/21/18 0731 06/22/18 0659 06/23/18 0446  WBC 9.5  --  9.1 9.0 7.5  HGB 10.1* 10.2* 8.3* 8.0* 8.4*  HCT 33.1* 30.0* 26.0* 25.4* 26.9*  MCV 80.3  --  76.7* 76.5* 76.6*  PLT 360  --  326 305 312   Cardiac Enzymes: Recent Labs  Lab 06/20/18 1533 06/20/18 2143 06/21/18 0241 06/21/18 0731  TROPONINI <0.03 <0.03 <0.03 <0.03   BNP: Invalid input(s): POCBNP CBG: Recent Labs  Lab 06/24/18 1106 06/24/18 1607 06/24/18 2119 06/25/18 0615 06/25/18 1126  GLUCAP 266* 225* 218* 132* 229*   D-Dimer No results for input(s): DDIMER in the last 72 hours. Hgb A1c No results for input(s): HGBA1C in the last 72 hours. Lipid Profile No results for input(s): CHOL, HDL, LDLCALC, TRIG, CHOLHDL, LDLDIRECT in the last 72 hours. Thyroid function studies No results for input(s): TSH, T4TOTAL, T3FREE, THYROIDAB in the last 72 hours.  Invalid input(s): FREET3 Anemia work up Recent Labs    06/24/18 0349  FERRITIN 7*  TIBC 272  IRON 25*   Urinalysis    Component Value Date/Time   COLORURINE STRAW (A) 06/20/2018 2127   APPEARANCEUR CLEAR 06/20/2018 2127   LABSPEC 1.008  06/20/2018 2127   LABSPEC 1.010 03/09/2016 1400   PHURINE 6.0 06/20/2018 2127   GLUCOSEU NEGATIVE 06/20/2018 2127   GLUCOSEU NEGATIVE 03/11/2018 0958   GLUCOSEU Negative 03/09/2016 1400   HGBUR NEGATIVE 06/20/2018 2127   BILIRUBINUR NEGATIVE 06/20/2018 2127   BILIRUBINUR Negative 03/09/2016 1400   KETONESUR NEGATIVE 06/20/2018 2127   PROTEINUR 100 (A) 06/20/2018 2127   UROBILINOGEN 0.2 03/11/2018 0958   UROBILINOGEN 0.2 03/09/2016 1400   NITRITE NEGATIVE 06/20/2018 2127   LEUKOCYTESUR NEGATIVE 06/20/2018 2127   LEUKOCYTESUR Negative 03/09/2016 1400   Sepsis Labs Invalid input(s): PROCALCITONIN,  WBC,  LACTICIDVEN Microbiology Recent Results (from the past 240 hour(s))  SARS Coronavirus 2 (CEPHEID- Performed in [place] hospital lab), Hosp Order     Status: None   Collection Time: 06/20/18  5:22 PM  Result Value Ref Range Status   SARS Coronavirus 2 NEGATIVE NEGATIVE Final    Comment: (NOTE) If result is NEGATIVE SARS-CoV-2 target nucleic acids are NOT DETECTED. The SARS-CoV-2 RNA is generally detectable in upper and lower  respiratory specimens during the acute phase of infection. The lowest  concentration of SARS-CoV-2 viral copies this assay can detect is 250  copies / mL. A negative result does not preclude SARS-CoV-2 infection  and should not be used as the sole basis for treatment or other  patient management decisions.  A negative result may occur with  improper specimen collection / handling, submission of specimen other  than nasopharyngeal swab, presence of viral mutation(s) within the  areas targeted by this assay, and inadequate number of viral copies  (<250 copies / mL). A negative result must be combined with clinical  observations, patient history, and epidemiological information. If result is POSITIVE SARS-CoV-2 target nucleic acids are DETECTED. The SARS-CoV-2 RNA is generally detectable in upper and lower  respiratory specimens dur ing the acute phase of  infection.  Positive  results are indicative of active infection with SARS-CoV-2.  Clinical  correlation with patient history and other diagnostic information is  necessary to determine patient infection status.  Positive  results do  not rule out bacterial infection or co-infection with other viruses. If result is PRESUMPTIVE POSTIVE SARS-CoV-2 nucleic acids MAY BE PRESENT.   A presumptive positive result was obtained on the submitted specimen  and confirmed on repeat testing.  While 2019 novel coronavirus  (SARS-CoV-2) nucleic acids may be present in the submitted sample  additional confirmatory testing may be necessary for epidemiological  and / or clinical management purposes  to differentiate between  SARS-CoV-2 and other Sarbecovirus currently known to infect humans.  If clinically indicated additional testing with an alternate test  methodology [phone]) is advised. The SARS-CoV-2 RNA is generally  detectable in upper and lower respiratory sp ecimens during the acute  phase of infection. The expected result is Negative. Fact Sheet for Patients:  StrictlyIdeas.no Fact Sheet for Healthcare Providers: BankingDealers.co.za This test is not yet approved or cleared by the [place] FDA and has been authorized for detection and/or diagnosis of SARS-CoV-2 by FDA under an Emergency Use Authorization (EUA).  This EUA will remain in effect (meaning this test can be used) for the duration of the COVID-19 declaration under Section 564(b)(1) of the Act, 21 U.S.C. section 360bbb-3(b)(1), unless the authorization is terminated or revoked sooner. Performed at [facility], [place] [address], [place], [place] [address]      Time coordinating discharge:  I have spent 35 minutes face to face with the patient and on the ward discussing the patients care, assessment, plan and disposition with other care givers. >50% of the time was devoted  counseling the patient about the risks and benefits of treatment/Discharge disposition and coordinating care.   SIGNED:   Damita Lack, MD  [facility] 06/25/2018, 12:19 PM   If 7PM-7AM, please contact night-coverage [email or web address]

## 2018-06-25 NOTE — TOC Transition Note (Signed)
Transition of Care [facility]) - CM/SW Discharge Note   Patient Details  Name: Julie Brewer MRN: [id number] Date of Birth: 1950/02/27  Transition of [facility] CM/SW Contact:  Carles Collet, RN Phone Number: 06/25/2018, 10:58 AM   Clinical Narrative:    Patient to return home with follow up home health [facility]. LM w Butch Penny to notify of DC. No other CM needs.     Final next level of care: [place] Barriers to Discharge: No Barriers Identified   Patient Goals and CMS Choice Patient states their goals for this hospitalization and ongoing recovery are:: to go home CMS Medicare.gov Compare Post Acute Care list provided to:: Patient Choice offered to / list presented to : Patient  Discharge Placement                       Discharge Plan and Services In-house Referral: NA Discharge Planning Services: CM Consult Post Acute Care Choice: Home Health, Durable Medical Equipment                    HH Arranged: PT [facility] Agency: [place] ([facility]) Date [place]: 06/25/18 Time [place]: [place] Representative spoke with at [place]: [place] ([place]) Interventions     Readmission Risk Interventions No flowsheet data found.

## 2018-06-25 NOTE — Plan of Care (Signed)
  Problem: Health Behavior/Discharge Planning: Goal: Ability to manage health-related needs will improve Outcome: Progressing   Problem: Clinical Measurements: Goal: Will remain free from infection Outcome: Progressing Goal: Respiratory complications will improve Outcome: Progressing Goal: Cardiovascular complication will be avoided Outcome: Progressing   

## 2018-06-25 NOTE — Progress Notes (Signed)
Patient ID: Julie Brewer, female   DOB: February 11, 1950, 68 y.o.   MRN: [id number] S: Feels much better O:BP (!) 143/74   Pulse 82   Temp 97.9 F (36.6 C) (Oral)   Resp 13   Ht 5' 3.25" (1.607 m)   Wt 65 kg   SpO2 99%   BMI 25.17 kg/m   Intake/Output Summary (Last 24 hours) at 06/25/2018 1112 Last data filed at 06/25/2018 3009 Gross per 24 hour  Intake 1041.24 ml  Output 1700 ml  Net -658.76 ml   Intake/Output: I/O last 3 completed shifts: In: 981.2 [P.O.:870; IV Piggyback:111.2] Out: 2500 [Urine:2500]  Intake/Output this shift:  Total I/O In: 240 [P.O.:240] Out: -  Weight change: -0.408 kg Gen: NAD CVS: no rub Resp: cta Abd: benign Ext: no edema  Recent Labs  Lab 06/20/18 1533 06/20/18 1737 06/21/18 0731 06/22/18 0659 06/23/18 0446 06/24/18 0349 06/25/18 0240  NA 138 138 139 137 137 137 133*  K 4.9 5.0 4.9 4.9 4.5 4.8 4.3  CL 110  --  109 108 106 104 98  CO2 13*  --  17* 19* 19* 23 21*  GLUCOSE 72  --  68* 101* 92 76 105*  BUN 29*  --  30* 34* 33* 37* 40*  CREATININE 3.11*  --  3.12* 3.06* 2.86* 3.14* 3.08*  ALBUMIN  --   --  2.4*  --   --   --  2.7*  CALCIUM 8.7*  --  8.4* 8.1* 8.4* 8.6* 8.6*  PHOS  --   --  4.7*  --   --   --  6.1*  AST  --   --  11*  --   --   --   --   ALT  --   --  9  --   --   --   --    Liver Function Tests: Recent Labs  Lab 06/21/18 0731 06/25/18 0240  AST 11*  --   ALT 9  --   ALKPHOS 82  --   BILITOT 0.4  --   PROT 5.6*  --   ALBUMIN 2.4* 2.7*   No results for input(s): LIPASE, AMYLASE in the last 168 hours. No results for input(s): AMMONIA in the last 168 hours. CBC: Recent Labs  Lab 06/20/18 1533  06/21/18 0731 06/22/18 0659 06/23/18 0446  WBC 9.5  --  9.1 9.0 7.5  HGB 10.1*   < > 8.3* 8.0* 8.4*  HCT 33.1*   < > 26.0* 25.4* 26.9*  MCV 80.3  --  76.7* 76.5* 76.6*  PLT 360  --  326 305 312   < > = values in this interval not displayed.   Cardiac Enzymes: Recent Labs  Lab 06/20/18 1533 06/20/18 2143  06/21/18 0241 06/21/18 0731  TROPONINI <0.03 <0.03 <0.03 <0.03   CBG: Recent Labs  Lab 06/24/18 0631 06/24/18 1106 06/24/18 1607 06/24/18 2119 06/25/18 0615  GLUCAP 106* 266* 225* 218* 132*    Iron Studies:  Recent Labs    06/24/18 0349  IRON 25*  TIBC 272  FERRITIN 7*   Studies/Results: No results found. Marland Kitchen amLODipine  10 mg Oral Q48H  . aspirin  325 mg Oral Daily  . atorvastatin  20 mg Oral Daily  . cilostazol  100 mg Oral BID  . enoxaparin (LOVENOX) injection  30 mg Subcutaneous QHS  . furosemide  80 mg Oral BID  . gabapentin  100 mg Oral BID  . insulin aspart  0-9  Units Subcutaneous TID AC & HS  . insulin glargine  5 Units Subcutaneous Daily  . labetalol  100 mg Oral BID  . magnesium oxide  400 mg Oral BID  . metolazone  2.5 mg Oral BID  . nicotine  21 mg Transdermal Daily  . pantoprazole  40 mg Oral Daily  . senna  1 tablet Oral BID  . sodium chloride flush  3 mL Intravenous Q12H    BMET    Component Value Date/Time   NA 133 (L) 06/25/2018 0240   K 4.3 06/25/2018 0240   CL 98 06/25/2018 0240   CO2 21 (L) 06/25/2018 0240   GLUCOSE 105 (H) 06/25/2018 0240   BUN 40 (H) 06/25/2018 0240   CREATININE 3.08 (H) 06/25/2018 0240   CREATININE 1.39 (H) 07/17/2017 1012   CALCIUM 8.6 (L) 06/25/2018 0240   GFRNONAA 15 (L) 06/25/2018 0240   GFRNONAA 39 (L) 07/17/2017 1012   GFRAA 18 (L) 06/25/2018 0240   GFRAA 45 (L) 07/17/2017 1012   CBC    Component Value Date/Time   WBC 7.5 06/23/2018 0446   RBC 3.51 (L) 06/23/2018 0446   HGB 8.4 (L) 06/23/2018 0446   HGB 9.3 (L) 07/17/2017 1012   HGB 10.1 (L) 01/17/2017 1043   HCT 26.9 (L) 06/23/2018 0446   HCT 31.8 (L) 01/17/2017 1043   PLT 312 06/23/2018 0446   PLT 239 07/17/2017 1012   PLT 227 01/17/2017 1043   MCV 76.6 (L) 06/23/2018 0446   MCV 82.0 01/17/2017 1043   MCH 23.9 (L) 06/23/2018 0446   MCHC 31.2 06/23/2018 0446   RDW 16.8 (H) 06/23/2018 0446   RDW 16.4 (H) 01/17/2017 1043   LYMPHSABS 1.5  07/17/2017 1012   LYMPHSABS 1.6 01/17/2017 1043   MONOABS 0.6 07/17/2017 1012   MONOABS 0.6 01/17/2017 1043   EOSABS 0.2 07/17/2017 1012   EOSABS 0.1 01/17/2017 1043   BASOSABS 0.1 07/17/2017 1012   BASOSABS 0.0 01/17/2017 1043    Assessment/Plan:  1. AKI/CKD stage 3- in setting of decompensated diastolic CHF and concomitant ARB therapy. Cr baseline around 2 and peaked at 3.12 and slowly improving to 2.85.  1. No significant change in renal function 2. Continue to hold losartan and follow UOP and Cr levels 3. Stable cr with po lasix  2. Acute on chronic diastolic CHF- responding to diuretics 1. Ok to change to po lasix and see how she does 2. Recommend 80mg  lasix po qam  3. IDDM- per primary svc 4. Hyponatremia- due to AKI and CHF will repeat as an outpatient as she is asymptomatic 1. Discussed fluid restriction once she is discharged.  5. H/o CVa 6. HTN- stable 7. Anemia of CKD- low iron stores, will give IV feraheme and follow.  She will likely require ESA treatments as an outpatient.  She will f/u with Dr. Johnney Ou in our office after discharge 8. Disposition- stable for discharge from renal standpoint and pt knows to call our office and reschedule f/u with Dr. Johnney Ou.  Donetta Potts, MD [employer] [phone]

## 2018-06-26 DIAGNOSIS — N183 Chronic kidney disease, stage 3 (moderate): Secondary | ICD-10-CM | POA: Diagnosis not present

## 2018-06-26 DIAGNOSIS — Z8673 Personal history of transient ischemic attack (TIA), and cerebral infarction without residual deficits: Secondary | ICD-10-CM | POA: Diagnosis not present

## 2018-06-26 DIAGNOSIS — Z7902 Long term (current) use of antithrombotics/antiplatelets: Secondary | ICD-10-CM | POA: Diagnosis not present

## 2018-06-26 DIAGNOSIS — F17210 Nicotine dependence, cigarettes, uncomplicated: Secondary | ICD-10-CM | POA: Diagnosis not present

## 2018-06-26 DIAGNOSIS — E1122 Type 2 diabetes mellitus with diabetic chronic kidney disease: Secondary | ICD-10-CM | POA: Diagnosis not present

## 2018-06-26 DIAGNOSIS — I130 Hypertensive heart and chronic kidney disease with heart failure and stage 1 through stage 4 chronic kidney disease, or unspecified chronic kidney disease: Secondary | ICD-10-CM | POA: Diagnosis not present

## 2018-06-26 DIAGNOSIS — D631 Anemia in chronic kidney disease: Secondary | ICD-10-CM | POA: Diagnosis not present

## 2018-06-26 DIAGNOSIS — I5033 Acute on chronic diastolic (congestive) heart failure: Secondary | ICD-10-CM | POA: Diagnosis not present

## 2018-06-26 DIAGNOSIS — E1151 Type 2 diabetes mellitus with diabetic peripheral angiopathy without gangrene: Secondary | ICD-10-CM | POA: Diagnosis not present

## 2018-06-26 DIAGNOSIS — Z7982 Long term (current) use of aspirin: Secondary | ICD-10-CM | POA: Diagnosis not present

## 2018-06-26 DIAGNOSIS — Z794 Long term (current) use of insulin: Secondary | ICD-10-CM | POA: Diagnosis not present

## 2018-06-27 ENCOUNTER — Telehealth: Payer: Self-pay

## 2018-06-27 NOTE — Telephone Encounter (Signed)
This needs to be refused, she was recently discharged from the hospital and medication was stopped

## 2018-06-27 NOTE — Telephone Encounter (Signed)
Pt's pharmacy is requesting a refill on Losartan 25mg . This is not on med list. Would you like it to be refilled?

## 2018-06-27 NOTE — Telephone Encounter (Signed)
Noted. Medication refill request has been refused.

## 2018-07-01 NOTE — Progress Notes (Signed)
[facility] Summary Report / Loop Recorder 

## 2018-07-02 DIAGNOSIS — E785 Hyperlipidemia, unspecified: Secondary | ICD-10-CM | POA: Diagnosis not present

## 2018-07-02 DIAGNOSIS — Z23 Encounter for immunization: Secondary | ICD-10-CM | POA: Diagnosis not present

## 2018-07-02 DIAGNOSIS — I70212 Atherosclerosis of native arteries of extremities with intermittent claudication, left leg: Secondary | ICD-10-CM | POA: Diagnosis not present

## 2018-07-02 DIAGNOSIS — Z0000 Encounter for general adult medical examination without abnormal findings: Secondary | ICD-10-CM | POA: Diagnosis not present

## 2018-07-02 DIAGNOSIS — N183 Chronic kidney disease, stage 3 (moderate): Secondary | ICD-10-CM | POA: Diagnosis not present

## 2018-07-02 DIAGNOSIS — Z720 Tobacco use: Secondary | ICD-10-CM | POA: Diagnosis not present

## 2018-07-02 DIAGNOSIS — D509 Iron deficiency anemia, unspecified: Secondary | ICD-10-CM | POA: Diagnosis not present

## 2018-07-02 DIAGNOSIS — E1165 Type 2 diabetes mellitus with hyperglycemia: Secondary | ICD-10-CM | POA: Diagnosis not present

## 2018-07-02 DIAGNOSIS — I10 Essential (primary) hypertension: Secondary | ICD-10-CM | POA: Diagnosis not present

## 2018-07-02 DIAGNOSIS — E1151 Type 2 diabetes mellitus with diabetic peripheral angiopathy without gangrene: Secondary | ICD-10-CM | POA: Diagnosis not present

## 2018-07-03 DIAGNOSIS — I130 Hypertensive heart and chronic kidney disease with heart failure and stage 1 through stage 4 chronic kidney disease, or unspecified chronic kidney disease: Secondary | ICD-10-CM | POA: Diagnosis not present

## 2018-07-03 DIAGNOSIS — I5033 Acute on chronic diastolic (congestive) heart failure: Secondary | ICD-10-CM | POA: Diagnosis not present

## 2018-07-03 DIAGNOSIS — E1151 Type 2 diabetes mellitus with diabetic peripheral angiopathy without gangrene: Secondary | ICD-10-CM | POA: Diagnosis not present

## 2018-07-03 DIAGNOSIS — N183 Chronic kidney disease, stage 3 (moderate): Secondary | ICD-10-CM | POA: Diagnosis not present

## 2018-07-03 DIAGNOSIS — D631 Anemia in chronic kidney disease: Secondary | ICD-10-CM | POA: Diagnosis not present

## 2018-07-03 DIAGNOSIS — E1122 Type 2 diabetes mellitus with diabetic chronic kidney disease: Secondary | ICD-10-CM | POA: Diagnosis not present

## 2018-07-07 ENCOUNTER — Telehealth: Payer: Self-pay | Admitting: Endocrinology

## 2018-07-07 DIAGNOSIS — N183 Chronic kidney disease, stage 3 (moderate): Secondary | ICD-10-CM | POA: Diagnosis not present

## 2018-07-07 DIAGNOSIS — I129 Hypertensive chronic kidney disease with stage 1 through stage 4 chronic kidney disease, or unspecified chronic kidney disease: Secondary | ICD-10-CM | POA: Diagnosis not present

## 2018-07-07 DIAGNOSIS — R809 Proteinuria, unspecified: Secondary | ICD-10-CM | POA: Diagnosis not present

## 2018-07-07 DIAGNOSIS — R609 Edema, unspecified: Secondary | ICD-10-CM | POA: Diagnosis not present

## 2018-07-07 DIAGNOSIS — E1151 Type 2 diabetes mellitus with diabetic peripheral angiopathy without gangrene: Secondary | ICD-10-CM | POA: Diagnosis not present

## 2018-07-07 DIAGNOSIS — E1122 Type 2 diabetes mellitus with diabetic chronic kidney disease: Secondary | ICD-10-CM | POA: Diagnosis not present

## 2018-07-07 DIAGNOSIS — Z720 Tobacco use: Secondary | ICD-10-CM | POA: Diagnosis not present

## 2018-07-07 DIAGNOSIS — I5033 Acute on chronic diastolic (congestive) heart failure: Secondary | ICD-10-CM | POA: Diagnosis not present

## 2018-07-07 DIAGNOSIS — I130 Hypertensive heart and chronic kidney disease with heart failure and stage 1 through stage 4 chronic kidney disease, or unspecified chronic kidney disease: Secondary | ICD-10-CM | POA: Diagnosis not present

## 2018-07-07 DIAGNOSIS — D631 Anemia in chronic kidney disease: Secondary | ICD-10-CM | POA: Diagnosis not present

## 2018-07-07 DIAGNOSIS — D649 Anemia, unspecified: Secondary | ICD-10-CM | POA: Diagnosis not present

## 2018-07-07 NOTE — Telephone Encounter (Signed)
Please advise 

## 2018-07-07 NOTE — Telephone Encounter (Signed)
Continue to use her insulin alone.  Recommend checking blood sugars at home to help manage diabetes

## 2018-07-07 NOTE — Telephone Encounter (Signed)
Per daughter the patients kidney doctor has taken patient off the Metformin and they need to know how to keep managing the patients diabetes.  Please call back at [phone]

## 2018-07-07 NOTE — Telephone Encounter (Signed)
Called pt's daughter and gave her MD message. Pt's daughter verbalized understanding.

## 2018-07-08 DIAGNOSIS — E1122 Type 2 diabetes mellitus with diabetic chronic kidney disease: Secondary | ICD-10-CM | POA: Diagnosis not present

## 2018-07-08 DIAGNOSIS — N183 Chronic kidney disease, stage 3 (moderate): Secondary | ICD-10-CM | POA: Diagnosis not present

## 2018-07-08 DIAGNOSIS — I5033 Acute on chronic diastolic (congestive) heart failure: Secondary | ICD-10-CM | POA: Diagnosis not present

## 2018-07-08 DIAGNOSIS — I130 Hypertensive heart and chronic kidney disease with heart failure and stage 1 through stage 4 chronic kidney disease, or unspecified chronic kidney disease: Secondary | ICD-10-CM | POA: Diagnosis not present

## 2018-07-08 DIAGNOSIS — E1151 Type 2 diabetes mellitus with diabetic peripheral angiopathy without gangrene: Secondary | ICD-10-CM | POA: Diagnosis not present

## 2018-07-08 DIAGNOSIS — D631 Anemia in chronic kidney disease: Secondary | ICD-10-CM | POA: Diagnosis not present

## 2018-07-10 DIAGNOSIS — I5033 Acute on chronic diastolic (congestive) heart failure: Secondary | ICD-10-CM | POA: Diagnosis not present

## 2018-07-10 DIAGNOSIS — N183 Chronic kidney disease, stage 3 (moderate): Secondary | ICD-10-CM | POA: Diagnosis not present

## 2018-07-10 DIAGNOSIS — E1122 Type 2 diabetes mellitus with diabetic chronic kidney disease: Secondary | ICD-10-CM | POA: Diagnosis not present

## 2018-07-10 DIAGNOSIS — D631 Anemia in chronic kidney disease: Secondary | ICD-10-CM | POA: Diagnosis not present

## 2018-07-10 DIAGNOSIS — E1151 Type 2 diabetes mellitus with diabetic peripheral angiopathy without gangrene: Secondary | ICD-10-CM | POA: Diagnosis not present

## 2018-07-10 DIAGNOSIS — I130 Hypertensive heart and chronic kidney disease with heart failure and stage 1 through stage 4 chronic kidney disease, or unspecified chronic kidney disease: Secondary | ICD-10-CM | POA: Diagnosis not present

## 2018-07-11 ENCOUNTER — Other Ambulatory Visit (HOSPITAL_COMMUNITY): Payer: Self-pay | Admitting: Internal Medicine

## 2018-07-11 DIAGNOSIS — R809 Proteinuria, unspecified: Secondary | ICD-10-CM

## 2018-07-15 ENCOUNTER — Other Ambulatory Visit: Payer: Self-pay | Admitting: Radiology

## 2018-07-16 ENCOUNTER — Other Ambulatory Visit: Payer: Self-pay | Admitting: Radiology

## 2018-07-16 ENCOUNTER — Other Ambulatory Visit: Payer: Self-pay

## 2018-07-16 DIAGNOSIS — E1122 Type 2 diabetes mellitus with diabetic chronic kidney disease: Secondary | ICD-10-CM | POA: Diagnosis not present

## 2018-07-16 DIAGNOSIS — N183 Chronic kidney disease, stage 3 (moderate): Secondary | ICD-10-CM | POA: Diagnosis not present

## 2018-07-16 DIAGNOSIS — I130 Hypertensive heart and chronic kidney disease with heart failure and stage 1 through stage 4 chronic kidney disease, or unspecified chronic kidney disease: Secondary | ICD-10-CM | POA: Diagnosis not present

## 2018-07-16 DIAGNOSIS — E1151 Type 2 diabetes mellitus with diabetic peripheral angiopathy without gangrene: Secondary | ICD-10-CM | POA: Diagnosis not present

## 2018-07-16 DIAGNOSIS — I5033 Acute on chronic diastolic (congestive) heart failure: Secondary | ICD-10-CM | POA: Diagnosis not present

## 2018-07-16 DIAGNOSIS — D631 Anemia in chronic kidney disease: Secondary | ICD-10-CM | POA: Diagnosis not present

## 2018-07-17 ENCOUNTER — Encounter (HOSPITAL_COMMUNITY): Payer: Self-pay

## 2018-07-17 ENCOUNTER — Ambulatory Visit (HOSPITAL_COMMUNITY): Admission: RE | Admit: 2018-07-17 | Payer: Medicare Other | Source: Ambulatory Visit

## 2018-07-18 ENCOUNTER — Other Ambulatory Visit: Payer: Self-pay

## 2018-07-18 MED ORDER — LOSARTAN POTASSIUM 25 MG PO TABS: ORAL_TABLET | ORAL | 1 refills | Status: DC

## 2018-07-22 DIAGNOSIS — E1122 Type 2 diabetes mellitus with diabetic chronic kidney disease: Secondary | ICD-10-CM | POA: Diagnosis not present

## 2018-07-22 DIAGNOSIS — I5033 Acute on chronic diastolic (congestive) heart failure: Secondary | ICD-10-CM | POA: Diagnosis not present

## 2018-07-22 DIAGNOSIS — N183 Chronic kidney disease, stage 3 (moderate): Secondary | ICD-10-CM | POA: Diagnosis not present

## 2018-07-22 DIAGNOSIS — E1151 Type 2 diabetes mellitus with diabetic peripheral angiopathy without gangrene: Secondary | ICD-10-CM | POA: Diagnosis not present

## 2018-07-22 DIAGNOSIS — D631 Anemia in chronic kidney disease: Secondary | ICD-10-CM | POA: Diagnosis not present

## 2018-07-22 DIAGNOSIS — I130 Hypertensive heart and chronic kidney disease with heart failure and stage 1 through stage 4 chronic kidney disease, or unspecified chronic kidney disease: Secondary | ICD-10-CM | POA: Diagnosis not present

## 2018-07-25 DIAGNOSIS — D631 Anemia in chronic kidney disease: Secondary | ICD-10-CM | POA: Diagnosis not present

## 2018-07-25 DIAGNOSIS — N183 Chronic kidney disease, stage 3 (moderate): Secondary | ICD-10-CM | POA: Diagnosis not present

## 2018-07-25 DIAGNOSIS — I130 Hypertensive heart and chronic kidney disease with heart failure and stage 1 through stage 4 chronic kidney disease, or unspecified chronic kidney disease: Secondary | ICD-10-CM | POA: Diagnosis not present

## 2018-07-25 DIAGNOSIS — I5033 Acute on chronic diastolic (congestive) heart failure: Secondary | ICD-10-CM | POA: Diagnosis not present

## 2018-07-25 DIAGNOSIS — E1122 Type 2 diabetes mellitus with diabetic chronic kidney disease: Secondary | ICD-10-CM | POA: Diagnosis not present

## 2018-07-25 DIAGNOSIS — E1151 Type 2 diabetes mellitus with diabetic peripheral angiopathy without gangrene: Secondary | ICD-10-CM | POA: Diagnosis not present

## 2018-07-26 DIAGNOSIS — D631 Anemia in chronic kidney disease: Secondary | ICD-10-CM | POA: Diagnosis not present

## 2018-07-26 DIAGNOSIS — N183 Chronic kidney disease, stage 3 (moderate): Secondary | ICD-10-CM | POA: Diagnosis not present

## 2018-07-26 DIAGNOSIS — Z794 Long term (current) use of insulin: Secondary | ICD-10-CM | POA: Diagnosis not present

## 2018-07-26 DIAGNOSIS — Z7902 Long term (current) use of antithrombotics/antiplatelets: Secondary | ICD-10-CM | POA: Diagnosis not present

## 2018-07-26 DIAGNOSIS — Z7982 Long term (current) use of aspirin: Secondary | ICD-10-CM | POA: Diagnosis not present

## 2018-07-26 DIAGNOSIS — E1122 Type 2 diabetes mellitus with diabetic chronic kidney disease: Secondary | ICD-10-CM | POA: Diagnosis not present

## 2018-07-26 DIAGNOSIS — I130 Hypertensive heart and chronic kidney disease with heart failure and stage 1 through stage 4 chronic kidney disease, or unspecified chronic kidney disease: Secondary | ICD-10-CM | POA: Diagnosis not present

## 2018-07-26 DIAGNOSIS — Z8673 Personal history of transient ischemic attack (TIA), and cerebral infarction without residual deficits: Secondary | ICD-10-CM | POA: Diagnosis not present

## 2018-07-26 DIAGNOSIS — E1151 Type 2 diabetes mellitus with diabetic peripheral angiopathy without gangrene: Secondary | ICD-10-CM | POA: Diagnosis not present

## 2018-07-26 DIAGNOSIS — I5033 Acute on chronic diastolic (congestive) heart failure: Secondary | ICD-10-CM | POA: Diagnosis not present

## 2018-07-26 DIAGNOSIS — F17210 Nicotine dependence, cigarettes, uncomplicated: Secondary | ICD-10-CM | POA: Diagnosis not present

## 2018-07-27 LAB — CUP PACEART REMOTE DEVICE CHECK
Date Time Interrogation Session: 20200628103720
Implantable Pulse Generator Implant Date: 20180523

## 2018-07-28 ENCOUNTER — Ambulatory Visit (INDEPENDENT_AMBULATORY_CARE_PROVIDER_SITE_OTHER): Payer: Medicare Other | Admitting: *Deleted

## 2018-07-28 DIAGNOSIS — R55 Syncope and collapse: Secondary | ICD-10-CM | POA: Diagnosis not present

## 2018-07-29 ENCOUNTER — Other Ambulatory Visit: Payer: Self-pay | Admitting: Student

## 2018-07-29 DIAGNOSIS — Z23 Encounter for immunization: Secondary | ICD-10-CM | POA: Diagnosis not present

## 2018-07-30 ENCOUNTER — Ambulatory Visit (HOSPITAL_COMMUNITY)
Admission: RE | Admit: 2018-07-30 | Discharge: 2018-07-30 | Disposition: A | Discharge status: Home / Self Care | Payer: Medicare Other | Source: Ambulatory Visit | Attending: Internal Medicine | Admitting: Internal Medicine

## 2018-07-30 ENCOUNTER — Other Ambulatory Visit: Payer: Self-pay

## 2018-07-30 ENCOUNTER — Encounter (HOSPITAL_COMMUNITY): Payer: Self-pay

## 2018-07-30 DIAGNOSIS — Z886 Allergy status to analgesic agent status: Secondary | ICD-10-CM | POA: Diagnosis not present

## 2018-07-30 DIAGNOSIS — N19 Unspecified kidney failure: Secondary | ICD-10-CM | POA: Diagnosis not present

## 2018-07-30 DIAGNOSIS — E785 Hyperlipidemia, unspecified: Secondary | ICD-10-CM | POA: Diagnosis not present

## 2018-07-30 DIAGNOSIS — Z8249 Family history of ischemic heart disease and other diseases of the circulatory system: Secondary | ICD-10-CM | POA: Diagnosis not present

## 2018-07-30 DIAGNOSIS — Z794 Long term (current) use of insulin: Secondary | ICD-10-CM | POA: Insufficient documentation

## 2018-07-30 DIAGNOSIS — Z90710 Acquired absence of both cervix and uterus: Secondary | ICD-10-CM | POA: Insufficient documentation

## 2018-07-30 DIAGNOSIS — F17210 Nicotine dependence, cigarettes, uncomplicated: Secondary | ICD-10-CM | POA: Insufficient documentation

## 2018-07-30 DIAGNOSIS — E1151 Type 2 diabetes mellitus with diabetic peripheral angiopathy without gangrene: Secondary | ICD-10-CM | POA: Insufficient documentation

## 2018-07-30 DIAGNOSIS — I509 Heart failure, unspecified: Secondary | ICD-10-CM | POA: Insufficient documentation

## 2018-07-30 DIAGNOSIS — Z833 Family history of diabetes mellitus: Secondary | ICD-10-CM | POA: Insufficient documentation

## 2018-07-30 DIAGNOSIS — J45909 Unspecified asthma, uncomplicated: Secondary | ICD-10-CM | POA: Insufficient documentation

## 2018-07-30 DIAGNOSIS — K219 Gastro-esophageal reflux disease without esophagitis: Secondary | ICD-10-CM | POA: Insufficient documentation

## 2018-07-30 DIAGNOSIS — R809 Proteinuria, unspecified: Secondary | ICD-10-CM

## 2018-07-30 DIAGNOSIS — D509 Iron deficiency anemia, unspecified: Secondary | ICD-10-CM | POA: Insufficient documentation

## 2018-07-30 DIAGNOSIS — N189 Chronic kidney disease, unspecified: Secondary | ICD-10-CM | POA: Insufficient documentation

## 2018-07-30 DIAGNOSIS — Z79899 Other long term (current) drug therapy: Secondary | ICD-10-CM | POA: Diagnosis not present

## 2018-07-30 DIAGNOSIS — Z7982 Long term (current) use of aspirin: Secondary | ICD-10-CM | POA: Insufficient documentation

## 2018-07-30 DIAGNOSIS — H409 Unspecified glaucoma: Secondary | ICD-10-CM | POA: Insufficient documentation

## 2018-07-30 DIAGNOSIS — Z880 Allergy status to penicillin: Secondary | ICD-10-CM | POA: Insufficient documentation

## 2018-07-30 DIAGNOSIS — I130 Hypertensive heart and chronic kidney disease with heart failure and stage 1 through stage 4 chronic kidney disease, or unspecified chronic kidney disease: Secondary | ICD-10-CM | POA: Diagnosis not present

## 2018-07-30 DIAGNOSIS — Z809 Family history of malignant neoplasm, unspecified: Secondary | ICD-10-CM | POA: Insufficient documentation

## 2018-07-30 DIAGNOSIS — E1122 Type 2 diabetes mellitus with diabetic chronic kidney disease: Secondary | ICD-10-CM | POA: Diagnosis not present

## 2018-07-30 DIAGNOSIS — Z800 Family history of malignant neoplasm of digestive organs: Secondary | ICD-10-CM | POA: Diagnosis not present

## 2018-07-30 DIAGNOSIS — R944 Abnormal results of kidney function studies: Secondary | ICD-10-CM | POA: Diagnosis not present

## 2018-07-30 LAB — CBC
HCT: 36.5 % (ref 36.0–46.0)
Hemoglobin: 11.2 g/dL — ABNORMAL LOW (ref 12.0–15.0)
MCH: 24.6 pg — ABNORMAL LOW (ref 26.0–34.0)
MCHC: 30.7 g/dL (ref 30.0–36.0)
MCV: 80 fL (ref 80.0–100.0)
Platelets: 279 10*3/uL (ref 150–400)
RBC: 4.56 MIL/uL (ref 3.87–5.11)
RDW: 17.7 % — ABNORMAL HIGH (ref 11.5–15.5)
WBC: 9.5 10*3/uL (ref 4.0–10.5)
nRBC: 0 % (ref 0.0–0.2)

## 2018-07-30 LAB — PROTIME-INR
INR: 1.1 (ref 0.8–1.2)
Prothrombin Time: 14.4 seconds (ref 11.4–15.2)

## 2018-07-30 LAB — BASIC METABOLIC PANEL
Anion gap: 10 (ref 5–15)
BUN: 34 mg/dL — ABNORMAL HIGH (ref 8–23)
CO2: 21 mmol/L — ABNORMAL LOW (ref 22–32)
Calcium: 9.2 mg/dL (ref 8.9–10.3)
Chloride: 106 mmol/L (ref 98–111)
Creatinine, Ser: 2.75 mg/dL — ABNORMAL HIGH (ref 0.44–1.00)
GFR calc Af Amer: 20 mL/min — ABNORMAL LOW (ref >60)
GFR calc non Af Amer: 17 mL/min — ABNORMAL LOW (ref >60)
Glucose, Bld: 76 mg/dL (ref 70–99)
Potassium: 4.1 mmol/L (ref 3.5–5.1)
Sodium: 137 mmol/L (ref 135–145)

## 2018-07-30 LAB — GLUCOSE, CAPILLARY: Glucose-Capillary: 73 mg/dL (ref 70–99)

## 2018-07-30 MED ORDER — MIDAZOLAM HCL 2 MG/2ML IJ SOLN
INTRAMUSCULAR | Status: AC
  Filled 2018-07-30: qty 2

## 2018-07-30 MED ORDER — FENTANYL CITRATE (PF) 100 MCG/2ML IJ SOLN
INTRAMUSCULAR | Status: AC | PRN
  Administered 2018-07-30: 50 ug via INTRAVENOUS

## 2018-07-30 MED ORDER — HYDRALAZINE HCL 20 MG/ML IJ SOLN
INTRAMUSCULAR | Status: AC
  Filled 2018-07-30: qty 1

## 2018-07-30 MED ORDER — LIDOCAINE HCL (PF) 1 % IJ SOLN
INTRAMUSCULAR | Status: AC
  Filled 2018-07-30: qty 30

## 2018-07-30 MED ORDER — MIDAZOLAM HCL 2 MG/2ML IJ SOLN
INTRAMUSCULAR | Status: AC | PRN
  Administered 2018-07-30: 1 mg via INTRAVENOUS

## 2018-07-30 MED ORDER — SODIUM CHLORIDE 0.9 % IV SOLN: INTRAVENOUS | Status: DC

## 2018-07-30 MED ORDER — FENTANYL CITRATE (PF) 100 MCG/2ML IJ SOLN
INTRAMUSCULAR | Status: AC
  Filled 2018-07-30: qty 2

## 2018-07-30 NOTE — H&P (Signed)
Chief Complaint: Patient was seen in consultation today for random renal biopsy.  Referring Physician(s): Justin Mend  Supervising Physician: Arne Cleveland  Patient Status: [facility] - Out-pt  History of Present Illness: Julie Brewer is a 68 y.o. female with a past medical history significant for GERD, HTN, HLD, PVD, CHF, DM, glaucoma, blindness and CKD who presents today for a random renal biopsy. Per chart patient presented to [facility] in May of this year with complaints of dyspnea, orthopnea and bilateral lower extremity swelling - she was aggressively diuresed which elevated her creatinine to 3.1 from baseline of 2.0. Nephrology was consulted who recommended monitoring and discontinuation of nephrotoxic drugs. She was discharged to home on 06/25/18 with planned outpatient follow up with nephrology. She was seen by Dr. Johnney Ou in June and unfortunately her creatinine continued to be elevated above her baseline - request has been made to IR for a random renal biopsy to further evaluate.  Patient reports that she feels great, has no complaints except that she is a little cold this morning. She stats that she has follow up with Dr. Johnney Ou on 7/21 where the results of her biopsy will be discussed. She states understanding of the procedure and wishes to proceed.   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   Diabetes mellitus    Type II per Dr [name]  (patient said type I)   Fibroid    GERD (gastroesophageal reflux disease)    Glaucoma    Hyperlipidemia    Hypertension    Iron deficiency anemia 03/09/2016   Peripheral vascular disease ([place])    Pneumonia 2006   Shortness of breath dyspnea    with exdrtion, "Walkling too fast"   Stroke [facility])    no residual    Past Surgical History:  Procedure Laterality Date   ABDOMINAL HYSTERECTOMY     BIOPSY   06/10/2017   Procedure: BIOPSY;  Surgeon: Ronnette Juniper, MD;  Location: [facility] ENDOSCOPY;  Service: Gastroenterology;;   CERVICAL FUSION     with graft from hip   COLONOSCOPY  July 09, 2012   DIRECT LARYNGOSCOPY N/A 06/07/2014   Procedure: DIRECT LARYNGOSCOPY with BIOPSY and EXCISION VOLLECULAR CYST;  Surgeon: Ruby Cola, MD;  Location: [place];  Service: ENT;  Laterality: N/A;   ESOPHAGOGASTRODUODENOSCOPY (EGD) WITH PROPOFOL Left 06/10/2017   Procedure: ESOPHAGOGASTRODUODENOSCOPY (EGD) WITH PROPOFOL;  Surgeon: Ronnette Juniper, MD;  Location: [place];  Service: Gastroenterology;  Laterality: Left;   FLEXIBLE SIGMOIDOSCOPY Left 06/10/2017   Procedure: FLEXIBLE SIGMOIDOSCOPY;  Surgeon: Ronnette Juniper, MD;  Location: [place];  Service: Gastroenterology;  Laterality: Left;   LOOP RECORDER INSERTION N/A 06/20/2016   Procedure: Loop Recorder Insertion;  Surgeon: [name], MD;  Location: [place] CV LAB;  Service: Cardiovascular;  Laterality: N/A;   REFRACTIVE SURGERY Bilateral    both eyes   SPINE SURGERY     lumbar    Allergies: Morphine and related and Penicillins  Medications: Prior to Admission medications   Medication Sig Start Date End Date Taking? Authorizing Provider  amLODipine (NORVASC) 5 MG tablet Take 5 mg by mouth daily.   Yes [provider]  aspirin 325 MG EC tablet Take 325 mg by mouth daily.    Yes [provider]  atorvastatin (LIPITOR) 20 MG tablet Take 1 tablet by mouth once daily 05/28/18  Yes Elayne Snare, MD  docusate sodium (COLACE) 100 MG capsule Take 100 mg by mouth 2 (two) times daily.   Yes [provider]  furosemide (LASIX) 40 MG tablet Take 40 mg by mouth daily.   Yes [provider]  gabapentin (NEURONTIN) 100 MG capsule Take 1 capsule by mouth twice daily 06/15/18  Yes Elayne Snare, MD  insulin glargine (LANTUS) 100 UNIT/ML injection Inject 0.14 mLs (14 Units total) into the skin daily. Patient taking differently:  Inject 16 Units into the skin daily. INJECT 16 UNITS UNDER THE SKIN ONCE DAILY. 10/10/17  Yes Elayne Snare, MD  insulin regular (NOVOLIN R RELION) 100 units/mL injection Inject into the skin 2 (two) times daily before a meal. INJECT 10 UNITS UNDER THE SKIN AT BREAKFAST, AND 5 UNITS IN THE EVENING.   Yes [provider]  IRON PO Take 1 tablet by mouth 2 (two) times a day.    Yes [provider]  labetalol (NORMODYNE) 100 MG tablet Take 1 tablet (100 mg total) by mouth 2 (two) times daily. 05/05/18  Yes Elayne Snare, MD  omeprazole (PRILOSEC) 20 MG capsule TAKE 1 CAPSULE BY MOUTH ONCE DAILY (DUE  FOR  OFFICE  VISIT  THIS  MONTH) 05/28/18  Yes Elayne Snare, MD  Vitamin D, Ergocalciferol, (DRISDOL) 1.25 MG (50000 UT) CAPS capsule TAKE ONE CAPSULE BY MOUTH EVERY 7 DAYS Patient taking differently: Take 50,000 Units by mouth every 30 (thirty) days. Pt takes on 1st day of month 01/30/18  Yes Elayne Snare, MD  cilostazol (PLETAL) 100 MG tablet Take 1 tablet by mouth twice daily Patient taking differently: Take 100 mg by mouth 2 (two) times daily.  05/04/18   Waynetta Sandy, MD  diltiazem (CARDIZEM CD) 360 MG 24 hr capsule Take 1 capsule (360 mg total) by mouth daily. 03/14/18   Elayne Snare, MD  furosemide (LASIX) 80 MG tablet Take 1 tablet (80 mg total) by mouth daily for 30 days. 06/26/18 07/26/18  Damita Lack, MD  glucose blood (FREESTYLE LITE) test strip Use as instructed to check blood sugar 1 times a day dx code E11.65 03/14/18   Elayne Snare, MD  losartan (COZAAR) 25 MG tablet Take 1 tablet by mouth once daily. 07/18/18   Elayne Snare, MD  metFORMIN (GLUCOPHAGE-XR) 750 MG 24 hr tablet Take 1 tablet by mouth once daily 05/01/18   Elayne Snare, MD  pantoprazole (PROTONIX) 40 MG tablet Take 1 tablet (40 mg total) by mouth daily for 30 days. 06/26/18 07/26/18  Amin, Jeanella Flattery, MD  polyethylene glycol (MIRALAX / GLYCOLAX) 17 g packet Take 17 g by mouth daily as needed for moderate constipation.  06/25/18   Damita Lack, MD     Family History  Problem Relation Age of Onset   Cancer Mother    Heart disease Mother    Diabetes Father    Cancer Brother    Cancer Brother    Throat cancer Brother     Social History   Socioeconomic History   Marital status: Legally Separated    Spouse name: Not on file   Number of children: Not on file   Years of education: Not on file   Highest education level: Not on file  Occupational History   Not on file  Social Needs   Financial resource strain: Not on file   Food insecurity    Worry: Not on file    Inability: Not on file   Transportation needs  Medical: Not on file    Non-medical: Not on file  Tobacco Use   Smoking status: Current Every Day Smoker    Packs/day: 1.00    Years: 30.00    Pack years: 30.00    Types: Cigarettes   Smokeless tobacco: Never Used  Substance and Sexual Activity   Alcohol use: Yes    Comment: socially   Drug use: Not Currently    Types: Methylphenidate   Sexual activity: Never    Birth control/protection: Post-menopausal, Surgical    Comment: Hysterectomy  Lifestyle   Physical activity    Days per week: Not on file    Minutes per session: Not on file   Stress: Not on file  Relationships   Social connections    Talks on phone: Not on file    Gets together: Not on file    Attends religious service: Not on file    Active member of club or organization: Not on file    Attends meetings of clubs or organizations: Not on file    Relationship status: Not on file  Other Topics Concern   Not on file  Social History Narrative   Not on file     Review of Systems: A 12 point ROS discussed and pertinent positives are indicated in the HPI above.  All other systems are negative.  Review of Systems  Constitutional: Negative for appetite change, chills and fever.  Respiratory: Negative for cough and shortness of breath.   Cardiovascular: Negative for chest pain.    Gastrointestinal: Negative for abdominal pain, diarrhea, nausea and vomiting.  Genitourinary: Negative for dysuria and hematuria.  Musculoskeletal: Positive for back pain (Chronic).  Skin: Negative for rash.  Neurological: Negative for dizziness and syncope.    Vital Signs: BP (!) 173/59    Pulse (!) 59    Temp 98.7 F (37.1 C) (Oral)    Resp 16    Ht 5' 3.25" (1.607 m)    Wt 138 lb (62.6 kg)    SpO2 98%    BMI 24.25 kg/m   Physical Exam Vitals signs reviewed.  HENT:     Head: Normocephalic.  Cardiovascular:     Rate and Rhythm: Normal rate and regular rhythm.  Pulmonary:     Effort: Pulmonary effort is normal.     Breath sounds: Normal breath sounds.  Abdominal:     General: Bowel sounds are normal. There is no distension.     Palpations: Abdomen is soft.     Tenderness: There is no abdominal tenderness.  Skin:    General: Skin is warm and dry.  Neurological:     Mental Status: She is alert and oriented to person, place, and time.  Psychiatric:        Mood and Affect: Mood normal.        Behavior: Behavior normal.        Thought Content: Thought content normal.        Judgment: Judgment normal.      MD Evaluation Airway: WNL((+) top and bottom full dentures) Heart: WNL Abdomen: WNL Chest/ Lungs: WNL ASA  Classification: 3 Mallampati/Airway Score: Two   Imaging: No results found.  Labs:  CBC: Recent Labs    06/21/18 0731 06/22/18 0659 06/23/18 0446 07/30/18 0605  WBC 9.1 9.0 7.5 9.5  HGB 8.3* 8.0* 8.4* 11.2*  HCT 26.0* 25.4* 26.9* 36.5  PLT 326 305 312 279    COAGS: Recent Labs    07/30/18 0605  INR 1.1  BMP: Recent Labs    06/23/18 0446 06/24/18 0349 06/25/18 0240 07/30/18 0605  NA 137 137 133* 137  K 4.5 4.8 4.3 4.1  CL 106 104 98 106  CO2 19* 23 21* 21*  GLUCOSE 92 76 105* 76  BUN 33* 37* 40* 34*  CALCIUM 8.4* 8.6* 8.6* 9.2  CREATININE 2.86* 3.14* 3.08* 2.75*  GFRNONAA 17* 15* 15* 17*  GFRAA 19* 17* 18* 20*    LIVER  FUNCTION TESTS: Recent Labs    12/06/17 0935  03/11/18 0958 05/05/18 1110 06/21/18 0731 06/25/18 0240  BILITOT 0.2  --  0.2 0.2 0.4  --   AST 13  --  12 11 11*  --   ALT 11  --  11 8 9   --   ALKPHOS 95  --  108 110 82  --   PROT 6.7  --  6.6 6.6 5.6*  --   ALBUMIN 3.6   < > 3.5 3.4* 2.4* 2.7*   < > = values in this interval not displayed.    TUMOR MARKERS: No results for input(s): AFPTM, CEA, CA199, CHROMGRNA in the last 8760 hours.  Assessment and Plan:  68 y/o F with history of CKD recently admitted for CHF exacerbation for which she was aggressively diuresed. During this admission her creatinine increased to 3.1 from her baseline of 2.0 and she was seen by nephrology who recommended cessation of nephrotoxic drugs and outpatient follow up. She was seen by Dr. Johnney Ou in June and unfortunately her creatinine remained elevated above her baseline - request has been made to IR for random renal biopsy to further evaluate her declining kidney function.  Patient has been NPO since 8:30 last night, last does of pletal was Thursday 6/25. Afebrile, hgb 11.2, plt 279, INR 1.1, creatinine 2.75.  Risks and benefits discussed with the patient including, but not limited to bleeding, infection, damage to adjacent structures or low yield requiring additional tests.  All of the patient's questions were answered, patient is agreeable to proceed.  Consent signed and in chart. Thank you for this interesting consult.  I greatly enjoyed meeting Johan Antonacci Ayers-Farrar and look forward to participating in their care.  A copy of this report was sent to the requesting provider on this date.  Electronically Signed: Joaquim Nam, PA-C 07/30/2018, 7:30 AM   I spent a total of  30 Minutes  in face to face in clinical consultation, greater than 50% of which was counseling/coordinating care for random renal biopsy.

## 2018-07-30 NOTE — Procedures (Signed)
  Procedure: [place] [employer] renal biopsy 16g x2 EBL:   minimal Complications:  none immediate  See full dictation in [facility].  Dillard Cannon MD Main # [phone] Pager  [phone]

## 2018-07-30 NOTE — Discharge Instructions (Signed)

## 2018-07-31 DIAGNOSIS — I130 Hypertensive heart and chronic kidney disease with heart failure and stage 1 through stage 4 chronic kidney disease, or unspecified chronic kidney disease: Secondary | ICD-10-CM | POA: Diagnosis not present

## 2018-07-31 DIAGNOSIS — E1122 Type 2 diabetes mellitus with diabetic chronic kidney disease: Secondary | ICD-10-CM | POA: Diagnosis not present

## 2018-07-31 DIAGNOSIS — I5033 Acute on chronic diastolic (congestive) heart failure: Secondary | ICD-10-CM | POA: Diagnosis not present

## 2018-07-31 DIAGNOSIS — E1151 Type 2 diabetes mellitus with diabetic peripheral angiopathy without gangrene: Secondary | ICD-10-CM | POA: Diagnosis not present

## 2018-07-31 DIAGNOSIS — D631 Anemia in chronic kidney disease: Secondary | ICD-10-CM | POA: Diagnosis not present

## 2018-07-31 DIAGNOSIS — N183 Chronic kidney disease, stage 3 (moderate): Secondary | ICD-10-CM | POA: Diagnosis not present

## 2018-08-01 DIAGNOSIS — I5033 Acute on chronic diastolic (congestive) heart failure: Secondary | ICD-10-CM | POA: Diagnosis not present

## 2018-08-01 DIAGNOSIS — E1122 Type 2 diabetes mellitus with diabetic chronic kidney disease: Secondary | ICD-10-CM | POA: Diagnosis not present

## 2018-08-01 DIAGNOSIS — D631 Anemia in chronic kidney disease: Secondary | ICD-10-CM | POA: Diagnosis not present

## 2018-08-01 DIAGNOSIS — E1151 Type 2 diabetes mellitus with diabetic peripheral angiopathy without gangrene: Secondary | ICD-10-CM | POA: Diagnosis not present

## 2018-08-01 DIAGNOSIS — N183 Chronic kidney disease, stage 3 (moderate): Secondary | ICD-10-CM | POA: Diagnosis not present

## 2018-08-01 DIAGNOSIS — I130 Hypertensive heart and chronic kidney disease with heart failure and stage 1 through stage 4 chronic kidney disease, or unspecified chronic kidney disease: Secondary | ICD-10-CM | POA: Diagnosis not present

## 2018-08-04 NOTE — Progress Notes (Signed)
[facility] Summary Report / Loop Recorder 

## 2018-08-05 ENCOUNTER — Other Ambulatory Visit: Payer: Medicare Other

## 2018-08-07 NOTE — Progress Notes (Signed)
Patient ID: Julie Brewer, female   DOB: 19-Dec-1950, 68 y.o.   MRN: [id number]   Reason for Appointment: Follow-up of various problems, including hypertension  History of Present Illness   Today's office visit was provided via telemedicine using a telephone call to the patient Patient has been explained the limitations of evaluation and management by telemedicine and the availability of in person appointments.  The patient understood the limitations and agreed to proceed. Patient also understood that the telehealth visit is billable. . Location of the patient: Home . Location of the provider: Office Only the patient and myself were participating in the encounter   Diagnosis: Type 2 DIABETES MELITUS, date of diagnosis:  1985  Prior history: She has been on insulin since diagnosis and on Lantus previously Also at some point had been started on Glucophage several years ago also Because of insurance preference Lantus was changed to Levemir  She refuses to use analog rapid acting insulin because of cost and is using regular insulin for several years   Her blood sugars are generally well controlled and A1c usually under 7% Her A1c previously was higher with stopping metformin at 8%  Recent history:   Insulin regimen: Lantus insulin 16 U in the morning daily.  Regular insulin 30 minutes Before eating, 10 units a.m. and 5 ac supper  Oral hypoglycemic drugs: None  Her A1c in 03/2018 was 7.7 and is last 6.7 Also had an A1c done with PCP in June which was 6.9  Fructosamine previously 229  Current blood sugar patterns, management and problems:   She is getting her insulin measured in a syringe from her niece  Also now she is able to have her needs help her check her sugar in the morning before breakfast and after dinner  With her renal failure her metformin has been stopped  However she still appears to have low blood sugars occasionally fasting as low as 32 on her  meter  Again she has no symptoms of low sugars even at that level  She has only a few readings after meals and only once it was high  No readings after breakfast  However morning regular insulin was reduced on the last visit because of relatively low blood sugar in the lab after breakfast  Her last fasting lab glucose was 76  She appears to have lost weight compared to previously  Side effects from medications: None      Glucometer:  FreeStyle  FASTING range 32-149 with 2 low readings After dinner 116-251 with only one high reading   Meals:  usually 2 meals per day at 10 AM and 5 PM.     Mealtime protein sources:turkey, chicken.  Eating cereal for breakfast daily Avoiding sweet drinks  Physical activity: exercise: Some walking within the house              Wt Readings from Last 3 Encounters:  07/30/18 138 lb (62.6 kg)  06/25/18 143 lb 3.2 oz (65 kg)  06/19/18 160 lb (72.6 kg)   Lab Results  Component Value Date   HGBA1C 6.7 (A) 06/03/2018   HGBA1C 7.7 (H) 03/11/2018   HGBA1C 6.6 (H) 12/06/2017   Lab Results  Component Value Date   MICROALBUR 196.7 (H) 03/11/2018   LDLCALC 67 07/05/2017   CREATININE 2.75 (H) 07/30/2018      Lab Results  Component Value Date   FRUCTOSAMINE 218 03/11/2018   FRUCTOSAMINE 229 09/13/2017    Other active problems: See  review of systems    No visits with results within 1 Week(s) from this visit.  Latest known visit with results is:  Hospital Outpatient Visit on 07/30/2018  Component Date Value Ref Range Status  . WBC 07/30/2018 9.5  4.0 - 10.5 K/uL Final  . RBC 07/30/2018 4.56  3.87 - 5.11 MIL/uL Final  . Hemoglobin 07/30/2018 11.2* 12.0 - 15.0 g/dL Final  . HCT 07/30/2018 36.5  36.0 - 46.0 % Final  . MCV 07/30/2018 80.0  80.0 - 100.0 fL Final  . MCH 07/30/2018 24.6* 26.0 - 34.0 pg Final  . MCHC 07/30/2018 30.7  30.0 - 36.0 g/dL Final  . RDW 07/30/2018 17.7* 11.5 - 15.5 % Final  . Platelets 07/30/2018 279  150 - 400 K/uL  Final  . nRBC 07/30/2018 0.0  0.0 - 0.2 % Final   Performed at [facility], [place] [address], [place], [place] [address]  . Prothrombin Time 07/30/2018 14.4  11.4 - 15.2 seconds Final  . INR 07/30/2018 1.1  0.8 - 1.2 Final   Comment: (NOTE) INR goal varies based on device and disease states. Performed at [facility], [place] [address] [place], [place] [address]   . Sodium 07/30/2018 137  135 - 145 mmol/L Final  . Potassium 07/30/2018 4.1  3.5 - 5.1 mmol/L Final  . Chloride 07/30/2018 106  98 - 111 mmol/L Final  . CO2 07/30/2018 21* 22 - 32 mmol/L Final  . Glucose, Bld 07/30/2018 76  70 - 99 mg/dL Final  . BUN 07/30/2018 34* 8 - 23 mg/dL Final  . Creatinine, Ser 07/30/2018 2.75* 0.44 - 1.00 mg/dL Final  . Calcium 07/30/2018 9.2  8.9 - 10.3 mg/dL Final  . GFR calc non Af Amer 07/30/2018 17* >60 mL/min Final  . GFR calc Af Amer 07/30/2018 20* >60 mL/min Final  . Anion gap 07/30/2018 10  5 - 15 Final   Performed at [facility], [place] [address], [place], [place] [address]  . Glucose-Capillary 07/30/2018 73  70 - 99 mg/dL Final  . Comment 1 07/30/2018 Notify RN   Final    Allergies as of 08/08/2018      Reactions   Morphine And Related Other (See Comments)   Hallucenations    Penicillins Rash, Other (See Comments)   Swelling      Medication List       Accurate as of August 07, 2018  9:27 PM. If you have any questions, ask your nurse or doctor.        amLODipine 5 MG tablet Commonly known as: NORVASC Take 5 mg by mouth daily.   aspirin 325 MG EC tablet Take 325 mg by mouth daily.   atorvastatin 20 MG tablet Commonly known as: LIPITOR Take 1 tablet by mouth once daily   cilostazol 100 MG tablet Commonly known as: PLETAL Take 1 tablet by mouth twice daily   diltiazem 360 MG 24 hr capsule Commonly known as: CARDIZEM CD Take 1 capsule (360 mg total) by mouth daily.   docusate sodium 100 MG capsule Commonly known as: COLACE Take 100 mg by mouth 2 (two)  times daily.   furosemide 40 MG tablet Commonly known as: LASIX Take 40 mg by mouth daily.   furosemide 80 MG tablet Commonly known as: LASIX Take 1 tablet (80 mg total) by mouth daily for 30 days.   gabapentin 100 MG capsule Commonly known as: NEURONTIN Take 1 capsule by mouth twice daily  glucose blood test strip Commonly known as: FREESTYLE LITE Use as instructed to check blood sugar 1 times a day dx code E11.65   insulin glargine 100 UNIT/ML injection Commonly known as: LANTUS Inject 0.14 mLs (14 Units total) into the skin daily. What changed:   how much to take  additional instructions   IRON PO Take 1 tablet by mouth 2 (two) times a day.   labetalol 100 MG tablet Commonly known as: NORMODYNE Take 1 tablet (100 mg total) by mouth 2 (two) times daily.   losartan 25 MG tablet Commonly known as: COZAAR Take 1 tablet by mouth once daily.   metFORMIN 750 MG 24 hr tablet Commonly known as: GLUCOPHAGE-XR Take 1 tablet by mouth once daily   NovoLIN R ReliOn 100 units/mL injection Generic drug: insulin regular Inject into the skin 2 (two) times daily before a meal. INJECT 10 UNITS UNDER THE SKIN AT BREAKFAST, AND 5 UNITS IN THE EVENING.   omeprazole 20 MG capsule Commonly known as: PRILOSEC TAKE 1 CAPSULE BY MOUTH ONCE DAILY (DUE  FOR  OFFICE  VISIT  THIS  MONTH)   pantoprazole 40 MG tablet Commonly known as: PROTONIX Take 1 tablet (40 mg total) by mouth daily for 30 days.   polyethylene glycol 17 g packet Commonly known as: MIRALAX / GLYCOLAX Take 17 g by mouth daily as needed for moderate constipation.   Vitamin D (Ergocalciferol) 1.25 MG (50000 UT) Caps capsule Commonly known as: DRISDOL TAKE ONE CAPSULE BY MOUTH EVERY 7 DAYS What changed: See the new instructions.       Allergies:  Allergies  Allergen Reactions  . Morphine And Related Other (See Comments)    Hallucenations   . Penicillins Rash and Other (See Comments)    Swelling    Past  Medical History:  Diagnosis Date  . Asthma    No probnlems recently  . 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 Diabetes mellitus    Type II per Dr [name]  (patient said type I)  . Fibroid   . GERD (gastroesophageal reflux disease)   . Glaucoma   . Hyperlipidemia   . Hypertension   . Iron deficiency anemia 03/09/2016  . Peripheral vascular disease ([place])   . Pneumonia 2006  . Shortness of breath dyspnea    with exdrtion, "Walkling too fast"  . Stroke [facility])    no residual    Past Surgical History:  Procedure Laterality Date  . ABDOMINAL HYSTERECTOMY    . BIOPSY  06/10/2017   Procedure: BIOPSY;  Surgeon: Ronnette Juniper, MD;  Location: [place];  Service: Gastroenterology;;  . CERVICAL FUSION     with graft from hip  . COLONOSCOPY  July 09, 2012  . DIRECT LARYNGOSCOPY N/A 06/07/2014   Procedure: DIRECT LARYNGOSCOPY with BIOPSY and EXCISION VOLLECULAR CYST;  Surgeon: Ruby Cola, MD;  Location: [facility] OR;  Service: ENT;  Laterality: N/A;  . ESOPHAGOGASTRODUODENOSCOPY (EGD) WITH PROPOFOL Left 06/10/2017   Procedure: ESOPHAGOGASTRODUODENOSCOPY (EGD) WITH PROPOFOL;  Surgeon: Ronnette Juniper, MD;  Location: [place];  Service: Gastroenterology;  Laterality: Left;  . FLEXIBLE SIGMOIDOSCOPY Left 06/10/2017   Procedure: FLEXIBLE SIGMOIDOSCOPY;  Surgeon: Ronnette Juniper, MD;  Location: [place];  Service: Gastroenterology;  Laterality: Left;  . LOOP RECORDER INSERTION N/A 06/20/2016   Procedure: Loop Recorder Insertion;  Surgeon: [name], MD;  Location: [facility];  Service: Cardiovascular;  Laterality: N/A;  . REFRACTIVE SURGERY  Bilateral    both eyes  . SPINE SURGERY     lumbar    Family History  Problem Relation Age of Onset  . Cancer Mother   . Heart disease Mother   . Diabetes Father   . Cancer Brother   . Cancer Brother   . Throat cancer Brother     Social History:  reports that she has been smoking cigarettes. She  has a 30.00 pack-year smoking history. She has never used smokeless tobacco. She reports current alcohol use. She reports previous drug use. Drug: Methylphenidate.  Review of Systems:   HYPERTENSION:  Treated with diltiazem 360 mg, labetalol 100 mg twice daily and losartan 25 mg daily  Previously lisinopril was stopped because of mild hyperkalemia and worsening renal function  She has been managed by her nephrologist now Although she was supposed to increase labetalol on her last visit she does not know if this was increased Blood pressure appears to be variable She does not measure at home  BP Readings from Last 3 Encounters:  07/30/18 (!) 160/70  06/25/18 (!) 143/74  06/19/18 132/64     Visual loss: She has absent vision on the right side and can see silhouettes only on the left.     Renal function: Her creatinine is significantly higher now and she is just had a renal biopsy Also has proteinuria, reportedly all from diabetes   Lab Results  Component Value Date   CREATININE 2.75 (H) 07/30/2018   CREATININE 3.08 (H) 06/25/2018   CREATININE 3.14 (H) 06/24/2018    Electrolyte abnormalities: No problems with low sodium or hyperkalemia  Lab Results  Component Value Date   NA 137 07/30/2018   K 4.1 07/30/2018   CL 106 07/30/2018   CO2 21 (L) 07/30/2018     HYPERLIPIDEMIA: The lipid abnormality consists of elevated LDL controlled with Lipitor 20 mg .   Her baseline LDL was 202  Her last LDL done by PCP in June was 75  Lab Results  Component Value Date   CHOL 150 07/05/2017   HDL 52.90 07/05/2017   LDLCALC 67 07/05/2017   TRIG 155.0 (H) 07/05/2017   CHOLHDL 3 07/05/2017    Vitamin D: Taking 50,000 unit dosage once a month This is monitored by PCP    Lab Results  Component Value Date   VD25OH 40.22 09/13/2016       Last diabetic foot exam was done in 6/19  Takes Gabapentin 100mg  regularly twice daily with control of her lower leg leg pains, she says  that her pain will be worse in her feet if she does not take this regularly    Examination:   There were no vitals taken for this visit.  There is no height or weight on file to calculate BMI.     ASSESSMENT/ PLAN:   HYPERTENSION:   Her blood pressure is being managed by nephrologist It is more difficult now to control because of renal dysfunction She will continue to follow-up with nephrologist and also recommended home monitoring  Diabetes type 2 on insulin  She has had a couple of low sugar recorded fasting at home without symptoms, unclear whether these were accurate However because of her renal insufficiency likely she is going to need less insulin She will reduce her Lantus to 14 units and if still having low sugars go down to 12 units For now we will continue the same dose of regular insulin  She will follow-up in 3 months unless  having more problems  Duration of telephone encounter =7 minutes    There are no Patient Instructions on file for this visit.  Elayne Snare 08/07/2018, 9:27 PM     Note: This office note was prepared with Dragon voice recognition system technology. Any transcriptional errors that result from this process are unintentional.

## 2018-08-08 ENCOUNTER — Encounter: Payer: Self-pay | Admitting: Endocrinology

## 2018-08-08 ENCOUNTER — Ambulatory Visit (INDEPENDENT_AMBULATORY_CARE_PROVIDER_SITE_OTHER): Payer: Medicare Other | Admitting: Endocrinology

## 2018-08-08 ENCOUNTER — Other Ambulatory Visit: Payer: Self-pay

## 2018-08-08 DIAGNOSIS — Z794 Long term (current) use of insulin: Secondary | ICD-10-CM | POA: Diagnosis not present

## 2018-08-08 DIAGNOSIS — E1165 Type 2 diabetes mellitus with hyperglycemia: Secondary | ICD-10-CM | POA: Diagnosis not present

## 2018-08-11 ENCOUNTER — Encounter (HOSPITAL_COMMUNITY): Payer: Self-pay | Admitting: Internal Medicine

## 2018-08-13 ENCOUNTER — Other Ambulatory Visit: Payer: Self-pay

## 2018-08-13 ENCOUNTER — Ambulatory Visit (INDEPENDENT_AMBULATORY_CARE_PROVIDER_SITE_OTHER): Payer: Medicare Other | Admitting: Podiatry

## 2018-08-13 ENCOUNTER — Encounter: Payer: Self-pay | Admitting: Podiatry

## 2018-08-13 VITALS — Temp 97.3°F

## 2018-08-13 DIAGNOSIS — D689 Coagulation defect, unspecified: Secondary | ICD-10-CM

## 2018-08-13 DIAGNOSIS — B351 Tinea unguium: Secondary | ICD-10-CM

## 2018-08-13 DIAGNOSIS — E1151 Type 2 diabetes mellitus with diabetic peripheral angiopathy without gangrene: Secondary | ICD-10-CM | POA: Diagnosis not present

## 2018-08-13 DIAGNOSIS — Q828 Other specified congenital malformations of skin: Secondary | ICD-10-CM | POA: Diagnosis not present

## 2018-08-13 NOTE — Progress Notes (Signed)
Complaint:  Visit Type: Patient returns to my office for continued preventative foot care services. Complaint: Patient states" my nails have grown long and thick and become painful to walk and wear shoes" Patient has been diagnosed with DM with angiopathy.. The patient presents for preventative foot care services. No changes to ROS.  Patient is blind and taking pletal.  Podiatric Exam: Vascular: dorsalis pedis and posterior tibial pulses are weakly palpable bilateral. Capillary return is immediate. Temperature gradient is WNL. Skin turgor WNL  Sensorium: Normal Semmes Weinstein monofilament test. Normal tactile sensation bilaterally. Nail Exam: Pt has thick disfigured discolored nails with subungual debris noted bilateral entire nail hallux through fifth toenails Ulcer Exam: There is no evidence of ulcer or pre-ulcerative changes or infection. Orthopedic Exam: Muscle tone and strength are WNL. No limitations in general ROM. No crepitus or effusions noted. Foot type and digits show no abnormalities. Bony prominences are unremarkable. Skin:  Porokeratosis sub 2 right.. No infection or ulcers  Diagnosis:  Onychomycosis, , Pain in right toe, pain in left toes  Debride porokeratosis right foot  Treatment & Plan Procedures and Treatment: Consent by patient was obtained for treatment procedures.   Debridement of mycotic and hypertrophic toenails, 1 through 5 bilateral and clearing of subungual debris. No ulceration, no infection noted.  Return Visit-Office Procedure: Patient instructed to return to the office for a follow up visit 3 months for continued evaluation and treatment.    Gardiner Barefoot DPM

## 2018-08-18 DIAGNOSIS — E1122 Type 2 diabetes mellitus with diabetic chronic kidney disease: Secondary | ICD-10-CM | POA: Diagnosis not present

## 2018-08-18 DIAGNOSIS — E1151 Type 2 diabetes mellitus with diabetic peripheral angiopathy without gangrene: Secondary | ICD-10-CM | POA: Diagnosis not present

## 2018-08-18 DIAGNOSIS — I5033 Acute on chronic diastolic (congestive) heart failure: Secondary | ICD-10-CM | POA: Diagnosis not present

## 2018-08-18 DIAGNOSIS — D631 Anemia in chronic kidney disease: Secondary | ICD-10-CM | POA: Diagnosis not present

## 2018-08-18 DIAGNOSIS — N183 Chronic kidney disease, stage 3 (moderate): Secondary | ICD-10-CM | POA: Diagnosis not present

## 2018-08-18 DIAGNOSIS — I130 Hypertensive heart and chronic kidney disease with heart failure and stage 1 through stage 4 chronic kidney disease, or unspecified chronic kidney disease: Secondary | ICD-10-CM | POA: Diagnosis not present

## 2018-08-19 DIAGNOSIS — Z720 Tobacco use: Secondary | ICD-10-CM | POA: Diagnosis not present

## 2018-08-19 DIAGNOSIS — R809 Proteinuria, unspecified: Secondary | ICD-10-CM | POA: Diagnosis not present

## 2018-08-19 DIAGNOSIS — I5030 Unspecified diastolic (congestive) heart failure: Secondary | ICD-10-CM | POA: Diagnosis not present

## 2018-08-19 DIAGNOSIS — I129 Hypertensive chronic kidney disease with stage 1 through stage 4 chronic kidney disease, or unspecified chronic kidney disease: Secondary | ICD-10-CM | POA: Diagnosis not present

## 2018-08-19 DIAGNOSIS — E1122 Type 2 diabetes mellitus with diabetic chronic kidney disease: Secondary | ICD-10-CM | POA: Diagnosis not present

## 2018-08-19 DIAGNOSIS — D649 Anemia, unspecified: Secondary | ICD-10-CM | POA: Diagnosis not present

## 2018-08-19 DIAGNOSIS — N183 Chronic kidney disease, stage 3 (moderate): Secondary | ICD-10-CM | POA: Diagnosis not present

## 2018-08-26 DIAGNOSIS — N184 Chronic kidney disease, stage 4 (severe): Secondary | ICD-10-CM | POA: Diagnosis not present

## 2018-08-29 ENCOUNTER — Ambulatory Visit (INDEPENDENT_AMBULATORY_CARE_PROVIDER_SITE_OTHER): Payer: Medicare Other | Admitting: *Deleted

## 2018-08-29 DIAGNOSIS — R55 Syncope and collapse: Secondary | ICD-10-CM | POA: Diagnosis not present

## 2018-08-29 LAB — CUP PACEART REMOTE DEVICE CHECK
Date Time Interrogation Session: 20200731082033
Implantable Pulse Generator Implant Date: 20180523

## 2018-09-01 ENCOUNTER — Other Ambulatory Visit: Payer: Self-pay | Admitting: Endocrinology

## 2018-09-04 NOTE — Progress Notes (Signed)
[facility] Summary Report / Loop Recorder 

## 2018-09-06 ENCOUNTER — Other Ambulatory Visit: Payer: Self-pay | Admitting: Endocrinology

## 2018-09-08 ENCOUNTER — Other Ambulatory Visit: Payer: Self-pay | Admitting: Family Medicine

## 2018-09-08 DIAGNOSIS — Z1231 Encounter for screening mammogram for malignant neoplasm of breast: Secondary | ICD-10-CM

## 2018-09-09 ENCOUNTER — Other Ambulatory Visit: Payer: Self-pay

## 2018-09-09 DIAGNOSIS — I779 Disorder of arteries and arterioles, unspecified: Secondary | ICD-10-CM

## 2018-09-10 ENCOUNTER — Other Ambulatory Visit: Payer: Self-pay

## 2018-09-10 DIAGNOSIS — E1165 Type 2 diabetes mellitus with hyperglycemia: Secondary | ICD-10-CM

## 2018-09-10 DIAGNOSIS — Z794 Long term (current) use of insulin: Secondary | ICD-10-CM

## 2018-09-10 MED ORDER — FREESTYLE LITE TEST VI STRP: ORAL_STRIP | 1 refills | Status: DC

## 2018-09-11 ENCOUNTER — Ambulatory Visit: Payer: Medicare Other

## 2018-09-17 ENCOUNTER — Telehealth (HOSPITAL_COMMUNITY): Payer: Self-pay | Admitting: Rehabilitation

## 2018-09-17 NOTE — Telephone Encounter (Signed)

## 2018-09-18 ENCOUNTER — Ambulatory Visit: Payer: Medicare Other | Admitting: Family

## 2018-09-18 ENCOUNTER — Ambulatory Visit (HOSPITAL_COMMUNITY)
Admission: RE | Admit: 2018-09-18 | Discharge: 2018-09-18 | Disposition: A | Discharge status: Home / Self Care | Payer: Medicare Other | Source: Ambulatory Visit | Attending: Family | Admitting: Family

## 2018-09-18 ENCOUNTER — Other Ambulatory Visit: Payer: Self-pay

## 2018-09-18 DIAGNOSIS — I779 Disorder of arteries and arterioles, unspecified: Secondary | ICD-10-CM | POA: Diagnosis not present

## 2018-09-22 ENCOUNTER — Other Ambulatory Visit: Payer: Self-pay | Admitting: Endocrinology

## 2018-09-24 ENCOUNTER — Ambulatory Visit (INDEPENDENT_AMBULATORY_CARE_PROVIDER_SITE_OTHER): Payer: Medicare Other | Admitting: Family

## 2018-09-24 ENCOUNTER — Encounter: Payer: Self-pay | Admitting: Family

## 2018-09-24 ENCOUNTER — Other Ambulatory Visit: Payer: Self-pay

## 2018-09-24 DIAGNOSIS — I779 Disorder of arteries and arterioles, unspecified: Secondary | ICD-10-CM | POA: Diagnosis not present

## 2018-09-24 DIAGNOSIS — F17200 Nicotine dependence, unspecified, uncomplicated: Secondary | ICD-10-CM

## 2018-09-24 NOTE — Patient Instructions (Addendum)

## 2018-09-24 NOTE — Progress Notes (Signed)
Virtual Visit via Telephone Note   I connected with Julie Brewer on 09/24/2018 using the [email or web address] by telephone and verified that I was speaking with the correct person using two identifiers. Patient was located at her home and accompanied by herself. I am located at the [facility] office/clinic.   The limitations of evaluation and management by telemedicine and the availability of in person appointments have been previously discussed with the patient and are documented in the patients chart. The patient expressed understanding and consented to proceed.  PCP: [name], MD  Chief Complaint: follow up peripheral artery occlusive disease   History of Present Illness: Julie Brewer is a 68 y.o. female whomDr. 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  She returned in May 2020 with c/o right knee and calf pain and swelling that started the prior week. She reported worse dyspnea in the prior week, also worse at rest in the prior week.  She states she took coumadin for blood clots in her legs in 1978, states she was told it was due to her use of oral contraceptive.  She states she had no swelling or pain, no claudication in her legs up until a week prior.   When Dr. Trula Slade met her in 2011 she was having claudication in her left leg at approximately one block. We treated her with cilostazol, and she did much better.  She denies non healing wounds.  She states she had a TIA in about 2010 as manifested by slurred speech that resolved, denies hemiparesis, denies further loss of vision. She states that her loss of vision in both eyes is due to glaucoma.  She continues to be medically managed for hypertension and hyperlipidemia as well as her diabetes.   She continues on Pletal.  She has a loop recorder, was evaluated by Dr. Gwenlyn Found for syncope, states she has had no more syncope since early 2019.   Diabetic: Yes,her last A1C was 6.1 in May 2020  per pt  Tobacco use: smoker  (decreased to 1/2 ppd, was 1 ppd, started at age 64 yrs)  Pt meds include: Statin :Yes Betablocker: yes ASA: Yes, 325 mg Other anticoagulants/antiplatelets: no   Past Medical History:  Diagnosis Date  . Asthma    No probnlems recently  . 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 Diabetes mellitus    Type II per Dr [name]  (patient said type I)  . Fibroid   . GERD (gastroesophageal reflux disease)   . Glaucoma   . Hyperlipidemia   . Hypertension   . Iron deficiency anemia 03/09/2016  . Peripheral vascular disease ([place])   . Pneumonia 2006  . Shortness of breath dyspnea    with exdrtion, "Walkling too fast"  . Stroke [facility])    no residual    Past Surgical History:  Procedure Laterality Date  . ABDOMINAL HYSTERECTOMY    . BIOPSY  06/10/2017   Procedure: BIOPSY;  Surgeon: Ronnette Juniper, MD;  Location: [place];  Service: Gastroenterology;;  . CERVICAL FUSION     with graft from hip  . COLONOSCOPY  July 09, 2012  . DIRECT LARYNGOSCOPY N/A 06/07/2014   Procedure: DIRECT LARYNGOSCOPY with BIOPSY and EXCISION VOLLECULAR CYST;  Surgeon: Ruby Cola, MD;  Location: [facility] OR;  Service: ENT;  Laterality: N/A;  . ESOPHAGOGASTRODUODENOSCOPY (EGD) WITH PROPOFOL Left 06/10/2017   Procedure: ESOPHAGOGASTRODUODENOSCOPY (EGD) WITH PROPOFOL;  Surgeon: Ronnette Juniper,  MD;  Location: [place];  Service: Gastroenterology;  Laterality: Left;  . FLEXIBLE SIGMOIDOSCOPY Left 06/10/2017   Procedure: FLEXIBLE SIGMOIDOSCOPY;  Surgeon: Ronnette Juniper, MD;  Location: [place];  Service: Gastroenterology;  Laterality: Left;  . LOOP RECORDER INSERTION N/A 06/20/2016   Procedure: Loop Recorder Insertion;  Surgeon: [name], MD;  Location: [place] [facility];  Service: Cardiovascular;  Laterality: N/A;  . REFRACTIVE SURGERY Bilateral    both eyes  . SPINE SURGERY     lumbar    Current Meds  Medication Sig  . amLODipine  (NORVASC) 5 MG tablet Take 5 mg by mouth daily.  Marland Kitchen aspirin 325 MG EC tablet Take 325 mg by mouth daily.   Marland Kitchen atorvastatin (LIPITOR) 20 MG tablet Take 1 tablet by mouth once daily  . cilostazol (PLETAL) 100 MG tablet Take 1 tablet by mouth twice daily (Patient taking differently: Take 100 mg by mouth 2 (two) times daily. )  . diltiazem (CARDIZEM CD) 360 MG 24 hr capsule Take 1 capsule (360 mg total) by mouth daily.  Marland Kitchen docusate sodium (COLACE) 100 MG capsule Take 100 mg by mouth 2 (two) times daily.  . furosemide (LASIX) 40 MG tablet Take 40 mg by mouth daily.  Marland Kitchen gabapentin (NEURONTIN) 100 MG capsule Take 1 capsule by mouth twice daily  . glucose blood (FREESTYLE LITE) test strip USE AS INSTRUCTED TO CHECK BLOOD SUGAR ONCE DAILY; E11.8  . insulin glargine (LANTUS) 100 UNIT/ML injection Inject 0.14 mLs (14 Units total) into the skin daily. (Patient taking differently: Inject 16 Units into the skin daily. INJECT 16 UNITS UNDER THE SKIN ONCE DAILY.)  . insulin regular (NOVOLIN R RELION) 100 units/mL injection Inject into the skin 2 (two) times daily before a meal. INJECT 10 UNITS UNDER THE SKIN AT BREAKFAST, AND 5 UNITS IN THE EVENING.  . IRON PO Take 1 tablet by mouth 2 (two) times a day.   . labetalol (NORMODYNE) 100 MG tablet Take 1 tablet by mouth twice daily  . losartan (COZAAR) 25 MG tablet Take 1 tablet by mouth once daily.  . metFORMIN (GLUCOPHAGE-XR) 750 MG 24 hr tablet Take 1 tablet by mouth once daily  . omeprazole (PRILOSEC) 20 MG capsule TAKE 1 CAPSULE BY MOUTH ONCE DAILY ( DUE FOR OFFICE VISIT THIS MONTH)  . polyethylene glycol (MIRALAX / GLYCOLAX) 17 g packet Take 17 g by mouth daily as needed for moderate constipation.  . Vitamin D, Ergocalciferol, (DRISDOL) 1.25 MG (50000 UT) CAPS capsule TAKE ONE CAPSULE BY MOUTH EVERY 7 DAYS (Patient taking differently: Take 50,000 Units by mouth every 30 (thirty) days. Pt takes on 1st day of month)    12 system ROS was negative unless otherwise  noted in HPI   Observations/Objective:   Assessment and Plan: Pt has a history ofmild left calf claudication, but currently denies claudication symptoms. She was climbing and descends stairs several times/day, may not be walking much due to her inability to see, is blind. She therefore may not walk enough to elicit claudication symptoms.   Her atherosclerotic risk factors include currently controlled DM and active smoking. She states that her DM has come under control since she has been under the care of Dr. [name], endocrinologist. She states her DM was uncontrolled for a long time prior to this.  She does not walk much since she is almost blind. See Plan.  Shestates thatPletal likely helps prevent her claudication.  She has not been able to walk in [place] for exercise  due to the need for social distancing. She is blind and cannot walk by herself. She has been performing daily seated leg exercises.    DATA    ABI Findings (09-18-18): +---------+------------------+-----+---------+--------+ Right    Rt Pressure (mmHg)IndexWaveform Comment  +---------+------------------+-----+---------+--------+ Brachial 178                    triphasic         +---------+------------------+-----+---------+--------+ PTA      173               0.97 triphasic         +---------+------------------+-----+---------+--------+ DP       160               0.90 triphasic         +---------+------------------+-----+---------+--------+ Great Toe176               0.99 Normal            +---------+------------------+-----+---------+--------+  +---------+------------------+-----+----------+-------+ Left     Lt Pressure (mmHg)IndexWaveform  Comment +---------+------------------+-----+----------+-------+ Brachial 178                    triphasic         +---------+------------------+-----+----------+-------+ PTA      111               0.62 triphasic          +---------+------------------+-----+----------+-------+ DP       106               0.60 monophasic        +---------+------------------+-----+----------+-------+ Great Toe106               0.60 Abnormal          +---------+------------------+-----+----------+-------+  +-------+-----------+-----------+------------+------------+ ABI/TBIToday's ABIToday's TBIPrevious ABIPrevious TBI +-------+-----------+-----------+------------+------------+ Right  0.97       0.99       1.07        0.71         +-------+-----------+-----------+------------+------------+ Left   0.62       0.60       0.71        0.61         +-------+-----------+-----------+------------+------------+ Bilateral ABIs and TBIs appear essentially unchanged compared to prior study on 09/24/2017.   Summary: Right: Resting right ankle-brachial index is within normal range. No evidence of significant right lower extremity arterial disease. The right toe-brachial index is normal. RT great toe pressure = 176 mmHg. Left: Resting left ankle-brachial index indicates moderate left lower extremity arterial disease. The left toe-brachial index is abnormal. LT Great toe pressure = 106 mmHg.    Venous Duplex right leg (06-19-18): No DVT, no popliteal cyst, no superficial venous thrombosis.     Follow Up Instructions:  Follow up 1 year with ABI's.  Walk a total of at least 30 minutes daily in a safe environment.   Over 3 minutes was spent counseling patient re smoking cessation, and patient was given several free resources re smoking cessation.   I discussed the assessment and treatment plan with the patient. The patient was provided an opportunity to ask questions and all were answered. The patient agreed with the plan and demonstrated an understanding of the instructions.   The patient was advised to call back or seek an in-person evaluation if the symptoms worsen or if the condition fails to improve  as anticipated.  I spent 9 minutes with the patient via telephone encounter.  Gabrielle Dare Johnatha Zeidman [facility] Office: [phone]  09/24/2018, 2:01 PM

## 2018-10-01 ENCOUNTER — Other Ambulatory Visit: Payer: Self-pay | Admitting: Endocrinology

## 2018-10-01 ENCOUNTER — Ambulatory Visit (INDEPENDENT_AMBULATORY_CARE_PROVIDER_SITE_OTHER): Payer: Medicare Other | Admitting: *Deleted

## 2018-10-01 DIAGNOSIS — R55 Syncope and collapse: Secondary | ICD-10-CM

## 2018-10-02 LAB — CUP PACEART REMOTE DEVICE CHECK
Date Time Interrogation Session: 20200902113546
Implantable Pulse Generator Implant Date: 20180523

## 2018-10-16 DIAGNOSIS — Z23 Encounter for immunization: Secondary | ICD-10-CM | POA: Diagnosis not present

## 2018-10-16 NOTE — Progress Notes (Signed)
[facility] Summary Report / Loop Recorder 

## 2018-10-24 ENCOUNTER — Other Ambulatory Visit: Payer: Self-pay

## 2018-10-24 ENCOUNTER — Ambulatory Visit: Payer: Medicare Other | Admitting: Podiatry

## 2018-10-24 ENCOUNTER — Encounter: Payer: Self-pay | Admitting: Podiatry

## 2018-10-24 ENCOUNTER — Ambulatory Visit (INDEPENDENT_AMBULATORY_CARE_PROVIDER_SITE_OTHER): Payer: Medicare Other | Admitting: Podiatry

## 2018-10-24 DIAGNOSIS — M79672 Pain in left foot: Secondary | ICD-10-CM

## 2018-10-24 DIAGNOSIS — M79675 Pain in left toe(s): Secondary | ICD-10-CM | POA: Diagnosis not present

## 2018-10-24 DIAGNOSIS — B351 Tinea unguium: Secondary | ICD-10-CM | POA: Diagnosis not present

## 2018-10-24 DIAGNOSIS — M79674 Pain in right toe(s): Secondary | ICD-10-CM | POA: Diagnosis not present

## 2018-10-24 DIAGNOSIS — Q828 Other specified congenital malformations of skin: Secondary | ICD-10-CM | POA: Diagnosis not present

## 2018-10-24 DIAGNOSIS — I779 Disorder of arteries and arterioles, unspecified: Secondary | ICD-10-CM

## 2018-10-24 DIAGNOSIS — E1151 Type 2 diabetes mellitus with diabetic peripheral angiopathy without gangrene: Secondary | ICD-10-CM

## 2018-10-24 DIAGNOSIS — L600 Ingrowing nail: Secondary | ICD-10-CM | POA: Diagnosis not present

## 2018-10-24 NOTE — Progress Notes (Signed)
Subjective:  Patient ID: Julie Brewer, female    DOB: [birth year],  MRN: [id number]  Chief Complaint  Patient presents with  . Nail Problem    "Do my calluses on my right foot and trim my nails.  My big toenail is painful on the left foot."    68 y.o. female presents with the above complaint.  Patient states that her nails have been hurting for a while now.  She also has a secondary complaint of left hallux ingrown toenail painful in nature.  The toenail has been bothering her for a couple of years now but she has finally decided to have an intervention to have the toenail removed.  She denies any other acute complaints.  Today she is ambulating with a cane.   Review of Systems: Negative except as noted in the HPI. Denies N/V/F/Ch.  Past Medical History:  Diagnosis Date  . Asthma    No probnlems recently  . 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 Diabetes mellitus    Type II per Dr [name]  (patient said type I)  . Fibroid   . GERD (gastroesophageal reflux disease)   . Glaucoma   . Hyperlipidemia   . Hypertension   . Iron deficiency anemia 03/09/2016  . Peripheral vascular disease ([place])   . Pneumonia 2006  . Shortness of breath dyspnea    with exdrtion, "Walkling too fast"  . Stroke [facility])    no residual    Current Outpatient Medications:  .  amLODipine (NORVASC) 5 MG tablet, Take 5 mg by mouth daily., Disp: , Rfl:  .  aspirin 325 MG EC tablet, Take 325 mg by mouth daily. , Disp: , Rfl:  .  atorvastatin (LIPITOR) 20 MG tablet, Take 1 tablet by mouth once daily, Disp: 90 tablet, Rfl: 0 .  cilostazol (PLETAL) 100 MG tablet, Take 1 tablet by mouth twice daily (Patient taking differently: Take 100 mg by mouth 2 (two) times daily. ), Disp: 60 tablet, Rfl: 5 .  diltiazem (CARDIZEM CD) 360 MG 24 hr capsule, Take 1 capsule (360 mg total) by mouth daily., Disp: 30 capsule, Rfl: 3 .  docusate sodium (COLACE) 100 MG capsule, Take 100  mg by mouth 2 (two) times daily., Disp: , Rfl:  .  furosemide (LASIX) 40 MG tablet, Take 40 mg by mouth daily., Disp: , Rfl:  .  gabapentin (NEURONTIN) 100 MG capsule, Take 1 capsule by mouth twice daily, Disp: 180 capsule, Rfl: 0 .  glucose blood (FREESTYLE LITE) test strip, USE AS INSTRUCTED TO CHECK BLOOD SUGAR ONCE DAILY; E11.8, Disp: 50 each, Rfl: 1 .  insulin glargine (LANTUS) 100 UNIT/ML injection, Inject 0.14 mLs (14 Units total) into the skin daily. (Patient taking differently: Inject 16 Units into the skin daily. INJECT 16 UNITS UNDER THE SKIN ONCE DAILY.), Disp: 10 mL, Rfl: 3 .  insulin regular (NOVOLIN R RELION) 100 units/mL injection, Inject into the skin 2 (two) times daily before a meal. INJECT 10 UNITS UNDER THE SKIN AT BREAKFAST, AND 5 UNITS IN THE EVENING., Disp: , Rfl:  .  IRON PO, Take 1 tablet by mouth 2 (two) times a day. , Disp: , Rfl:  .  labetalol (NORMODYNE) 100 MG tablet, Take 1 tablet by mouth twice daily, Disp: 60 tablet, Rfl: 2 .  losartan (COZAAR) 25 MG tablet, Take 1 tablet by mouth once daily., Disp: 90 tablet, Rfl: 1 .  metFORMIN (GLUCOPHAGE-XR)  750 MG 24 hr tablet, Take 1 tablet by mouth once daily, Disp: 90 tablet, Rfl: 0 .  omeprazole (PRILOSEC) 20 MG capsule, TAKE 1 CAPSULE BY MOUTH ONCE DAILY ( DUE FOR OFFICE VISIT THIS MONTH), Disp: 90 capsule, Rfl: 0 .  pantoprazole (PROTONIX) 40 MG tablet, Take 1 tablet (40 mg total) by mouth daily for 30 days., Disp: 30 tablet, Rfl: 0 .  polyethylene glycol (MIRALAX / GLYCOLAX) 17 g packet, Take 17 g by mouth daily as needed for moderate constipation., Disp: 14 each, Rfl: 0 .  Vitamin D, Ergocalciferol, (DRISDOL) 1.25 MG (50000 UT) CAPS capsule, TAKE ONE CAPSULE BY MOUTH EVERY 7 DAYS (Patient taking differently: Take 50,000 Units by mouth every 30 (thirty) days. Pt takes on 1st day of month), Disp: 12 capsule, Rfl: 0  Social History   Tobacco Use  Smoking Status Current Every Day Smoker  . Packs/day: 1.00  . Years: 30.00   . Pack years: 30.00  . Types: Cigarettes  Smokeless Tobacco Never Used    Allergies  Allergen Reactions  . Morphine And Related Other (See Comments)    Hallucenations   . Penicillins Rash and Other (See Comments)    Swelling   Objective:  There were no vitals filed for this visit. There is no height or weight on file to calculate BMI. Constitutional Well developed. Well nourished.  Vascular Dorsalis pedis pulses palpable bilaterally. Posterior tibial pulses palpable bilaterally. Capillary refill normal to all digits.  No cyanosis or clubbing noted. Pedal hair growth normal.  Neurologic Normal speech. Oriented to person, place, and time. Epicritic sensation to light touch grossly present bilaterally.  Dermatologic Painful ingrowing nail at medial nail borders of the hallux nail left. No other open wounds. No skin lesions.  Orthopedic: Normal joint ROM without pain or crepitus bilaterally. No visible deformities. No bony tenderness.   Radiographs: None Assessment:   1. Onychomycosis   2. Porokeratosis   3. Diabetes mellitus type 2 with peripheral artery disease (HCC)   4. Pain due to onychomycosis of toenails of both feet   5. Ingrown toenail of left foot   6. Left foot pain    Plan:  Patient was evaluated and treated and all questions answered.  Ingrown Nail, left -Patient elects to proceed with minor surgery to remove ingrown toenail removal today. Consent reviewed and signed by patient. -Ingrown nail excised. See procedure note. -Educated on post-procedure care including soaking. Written instructions provided and reviewed. -Patient to follow up in 2 weeks for nail check.  Onychomycosis toenails x 10 -Thickened elongated nails were debrided x10 using a large nipper.  No complication was noted.  Porokeratosis -2 sub-met 4 or keratoses were debrided using a chisel down to healthy skin.  No complication was noted.  Procedure: Excision of Ingrown Toenail  Location: Left 1st toe medial nail borders. Anesthesia: Lidocaine 1% plain; 1.5 mL and Marcaine 0.5% plain; 1.5 mL, digital block. Skin Prep: Betadine. Dressing: Silvadene; telfa; dry, sterile, compression dressing. Technique: Following skin prep, the toe was exsanguinated and a tourniquet was secured at the base of the toe. The affected nail border was freed, split with a nail splitter, and excised. Chemical matrixectomy was then performed with phenol and irrigated out with alcohol. The tourniquet was then removed and sterile dressing applied. Disposition: Patient tolerated procedure well. Patient to return in 2 weeks for follow-up.   Return in about 2 weeks (around 11/07/2018).

## 2018-10-24 NOTE — Patient Instructions (Signed)

## 2018-10-27 ENCOUNTER — Encounter: Payer: Self-pay | Admitting: Podiatry

## 2018-10-27 ENCOUNTER — Ambulatory Visit
Admission: RE | Admit: 2018-10-27 | Discharge: 2018-10-27 | Disposition: A | Discharge status: Home / Self Care | Payer: Medicare Other | Source: Ambulatory Visit | Attending: Family Medicine | Admitting: Family Medicine

## 2018-10-27 ENCOUNTER — Other Ambulatory Visit: Payer: Self-pay

## 2018-10-27 DIAGNOSIS — Z1231 Encounter for screening mammogram for malignant neoplasm of breast: Secondary | ICD-10-CM

## 2018-10-28 ENCOUNTER — Other Ambulatory Visit: Payer: Self-pay | Admitting: Vascular Surgery

## 2018-11-03 ENCOUNTER — Ambulatory Visit (INDEPENDENT_AMBULATORY_CARE_PROVIDER_SITE_OTHER): Payer: Medicare Other | Admitting: *Deleted

## 2018-11-03 DIAGNOSIS — I509 Heart failure, unspecified: Secondary | ICD-10-CM | POA: Diagnosis not present

## 2018-11-03 DIAGNOSIS — R55 Syncope and collapse: Secondary | ICD-10-CM

## 2018-11-03 LAB — CUP PACEART REMOTE DEVICE CHECK
Date Time Interrogation Session: 20201005113634
Implantable Pulse Generator Implant Date: 20180523

## 2018-11-04 ENCOUNTER — Other Ambulatory Visit (INDEPENDENT_AMBULATORY_CARE_PROVIDER_SITE_OTHER): Payer: Medicare Other

## 2018-11-04 ENCOUNTER — Other Ambulatory Visit: Payer: Self-pay

## 2018-11-04 DIAGNOSIS — E1165 Type 2 diabetes mellitus with hyperglycemia: Secondary | ICD-10-CM

## 2018-11-04 DIAGNOSIS — Z794 Long term (current) use of insulin: Secondary | ICD-10-CM | POA: Diagnosis not present

## 2018-11-04 LAB — COMPREHENSIVE METABOLIC PANEL
ALT: 12 U/L (ref 0–35)
AST: 14 U/L (ref 0–37)
Albumin: 3.6 g/dL (ref 3.5–5.2)
Alkaline Phosphatase: 100 U/L (ref 39–117)
BUN: 34 mg/dL — ABNORMAL HIGH (ref 6–23)
CO2: 22 mEq/L (ref 19–32)
Calcium: 8.9 mg/dL (ref 8.4–10.5)
Chloride: 108 mEq/L (ref 96–112)
Creatinine, Ser: 2.96 mg/dL — ABNORMAL HIGH (ref 0.40–1.20)
GFR: 19.18 mL/min — ABNORMAL LOW (ref >60.00)
Glucose, Bld: 131 mg/dL — ABNORMAL HIGH (ref 70–99)
Potassium: 4.6 mEq/L (ref 3.5–5.1)
Sodium: 137 mEq/L (ref 135–145)
Total Bilirubin: 0.2 mg/dL (ref 0.2–1.2)
Total Protein: 6.6 g/dL (ref 6.0–8.3)

## 2018-11-04 LAB — HEMOGLOBIN A1C: Hgb A1c MFr Bld: 7.6 % — ABNORMAL HIGH (ref 4.6–6.5)

## 2018-11-07 ENCOUNTER — Ambulatory Visit (INDEPENDENT_AMBULATORY_CARE_PROVIDER_SITE_OTHER): Payer: Medicare Other | Admitting: Endocrinology

## 2018-11-07 ENCOUNTER — Other Ambulatory Visit: Payer: Self-pay

## 2018-11-07 ENCOUNTER — Encounter: Payer: Self-pay | Admitting: Endocrinology

## 2018-11-07 ENCOUNTER — Ambulatory Visit: Payer: Medicare Other | Admitting: Endocrinology

## 2018-11-07 VITALS — BP 144/60 | HR 78 | Ht 63.25 in | Wt 147.8 lb

## 2018-11-07 DIAGNOSIS — E1165 Type 2 diabetes mellitus with hyperglycemia: Secondary | ICD-10-CM

## 2018-11-07 DIAGNOSIS — E1121 Type 2 diabetes mellitus with diabetic nephropathy: Secondary | ICD-10-CM | POA: Diagnosis not present

## 2018-11-07 DIAGNOSIS — I779 Disorder of arteries and arterioles, unspecified: Secondary | ICD-10-CM | POA: Diagnosis not present

## 2018-11-07 DIAGNOSIS — Z794 Long term (current) use of insulin: Secondary | ICD-10-CM | POA: Diagnosis not present

## 2018-11-07 NOTE — Progress Notes (Signed)
Patient ID: Julie Brewer, female   DOB: 10-May-1950, 68 y.o.   MRN: [id number]   Reason for Appointment: Follow-up of various problems, including hypertension  History of Present Illness    Diagnosis: Type 2 DIABETES MELITUS, date of diagnosis:  1985  Prior history: She has been on insulin since diagnosis and on Lantus previously Also at some point had been started on Glucophage several years ago also Because of insurance preference Lantus was changed to Levemir  She refuses to use analog rapid acting insulin because of cost and is using regular insulin for several years   Her blood sugars are generally well controlled and A1c usually under 7% Her A1c previously was higher with stopping metformin at 8%  Recent history:   Insulin regimen: Lantus insulin 14U in the morning daily.  Regular insulin 30 minutes Before eating, 10 units a.m. and 5-7 ac supper  Oral hypoglycemic drugs: None   Also had an A1c done with PCP in June which was 6.9  Fructosamine previously 229  Current blood sugar patterns, management and problems:  Her A1c is 7.6 compared to 6.7   She is checking her sugar with the help of her niece but only checking readings in the mornings now before breakfast  No further hypoglycemia with reducing Lantus by 2 units  She does not remember the only one high reading that she had at bedtime which was 315  Her monitor time is off by about 6-7 hours  She is mostly eating raisin bran cereal in the morning  Today her blood sugar in the office after breakfast is over 200  She still think she is taking her regular insulin up to 30 minutes before eating  She does take her Lantus regularly  Side effects from medications: None      Glucometer:  FreeStyle  FASTING range 70-190, AVERAGE 101 Previous range: 32-149 with 2 low readings Bedtime 315   Meals:  usually 2 meals per day at 10 AM and 5 PM.     Mealtime protein sources:turkey, chicken.   Eating cereal for breakfast daily Avoiding sweet drinks  Physical activity: exercise: Some walking within the house              Wt Readings from Last 3 Encounters:  11/07/18 147 lb 12.8 oz (67 kg)  07/30/18 138 lb (62.6 kg)  06/25/18 143 lb 3.2 oz (65 kg)   Lab Results  Component Value Date   HGBA1C 7.6 (H) 11/04/2018   HGBA1C 6.7 (A) 06/03/2018   HGBA1C 7.7 (H) 03/11/2018   Lab Results  Component Value Date   MICROALBUR 196.7 (H) 03/11/2018   LDLCALC 67 07/05/2017   CREATININE 2.96 (H) 11/04/2018      Lab Results  Component Value Date   FRUCTOSAMINE 218 03/11/2018   FRUCTOSAMINE 229 09/13/2017    Other active problems: See review of systems    Lab on 11/04/2018  Component Date Value Ref Range Status  . Sodium 11/04/2018 137  135 - 145 mEq/L Final  . Potassium 11/04/2018 4.6  3.5 - 5.1 mEq/L Final  . Chloride 11/04/2018 108  96 - 112 mEq/L Final  . CO2 11/04/2018 22  19 - 32 mEq/L Final  . Glucose, Bld 11/04/2018 131* 70 - 99 mg/dL Final  . BUN 11/04/2018 34* 6 - 23 mg/dL Final  . Creatinine, Ser 11/04/2018 2.96* 0.40 - 1.20 mg/dL Final  . Total Bilirubin 11/04/2018 0.2  0.2 - 1.2 mg/dL Final  . Alkaline  Phosphatase 11/04/2018 100  39 - 117 U/L Final  . AST 11/04/2018 14  0 - 37 U/L Final  . ALT 11/04/2018 12  0 - 35 U/L Final  . Total Protein 11/04/2018 6.6  6.0 - 8.3 g/dL Final  . Albumin 11/04/2018 3.6  3.5 - 5.2 g/dL Final  . Calcium 11/04/2018 8.9  8.4 - 10.5 mg/dL Final  . GFR 11/04/2018 19.18* >60.00 mL/min Final  . Hgb A1c MFr Bld 11/04/2018 7.6* 4.6 - 6.5 % Final   Glycemic Control Guidelines for People with Diabetes:Non Diabetic:  <6%Goal of Therapy: <7%Additional Action Suggested:  >8%   Appointment on 11/03/2018  Component Date Value Ref Range Status  . Date Time Interrogation Session 11/03/2018 [id number]   Final  . Pulse Generator Manufacturer 11/03/2018 MERM   Final  . Pulse Gen Model 11/03/2018 FBP10 Reveal LINQ   Final  . Pulse Gen Serial  Number 11/03/2018 [id number]   Final  . Clinic Name 11/03/2018 [facility]   Final  . Implantable Pulse Generator Type 11/03/2018 ICM/ILR   Final  . Implantable Pulse Generator Implan* 11/03/2018 [id number]   Final    Allergies as of 11/07/2018      Reactions   Morphine And Related Other (See Comments)   Hallucenations    Penicillins Rash, Other (See Comments)   Swelling      Medication List       Accurate as of November 07, 2018 11:23 AM. If you have any questions, ask your nurse or doctor.        STOP taking these medications   metFORMIN 750 MG 24 hr tablet Commonly known as: GLUCOPHAGE-XR Stopped by: Elayne Snare, MD     TAKE these medications   amLODipine 5 MG tablet Commonly known as: NORVASC Take 5 mg by mouth daily.   aspirin 325 MG EC tablet Take 325 mg by mouth daily.   atorvastatin 20 MG tablet Commonly known as: LIPITOR Take 1 tablet by mouth once daily   cilostazol 100 MG tablet Commonly known as: PLETAL Take 1 tablet by mouth twice daily   diltiazem 360 MG 24 hr capsule Commonly known as: CARDIZEM CD Take 1 capsule (360 mg total) by mouth daily.   docusate sodium 100 MG capsule Commonly known as: COLACE Take 100 mg by mouth 2 (two) times daily.   FREESTYLE LITE test strip Generic drug: glucose blood USE AS INSTRUCTED TO CHECK BLOOD SUGAR ONCE DAILY; E11.8   furosemide 40 MG tablet Commonly known as: LASIX Take 40 mg by mouth daily.   gabapentin 100 MG capsule Commonly known as: NEURONTIN Take 1 capsule by mouth twice daily   insulin glargine 100 UNIT/ML injection Commonly known as: LANTUS Inject 0.14 mLs (14 Units total) into the skin daily. What changed: additional instructions   IRON PO Take 1 tablet by mouth 2 (two) times a day.   labetalol 100 MG tablet Commonly known as: NORMODYNE Take 1 tablet by mouth twice daily   losartan 25 MG tablet Commonly known as: COZAAR Take 1 tablet by mouth once daily.   NovoLIN R ReliOn 100  units/mL injection Generic drug: insulin regular Inject into the skin 2 (two) times daily before a meal. INJECT 10 UNITS UNDER THE SKIN AT BREAKFAST, AND 5-7 UNITS IN THE EVENING.   omeprazole 20 MG capsule Commonly known as: PRILOSEC TAKE 1 CAPSULE BY MOUTH ONCE DAILY ( DUE FOR OFFICE VISIT THIS MONTH)   pantoprazole 40 MG tablet Commonly known as: PROTONIX Take 1  tablet (40 mg total) by mouth daily for 30 days.   polyethylene glycol 17 g packet Commonly known as: MIRALAX / GLYCOLAX Take 17 g by mouth daily as needed for moderate constipation.   Vitamin D (Ergocalciferol) 1.25 MG (50000 UT) Caps capsule Commonly known as: DRISDOL TAKE ONE CAPSULE BY MOUTH EVERY 7 DAYS What changed: See the new instructions.       Allergies:  Allergies  Allergen Reactions  . Morphine And Related Other (See Comments)    Hallucenations   . Penicillins Rash and Other (See Comments)    Swelling    Past Medical History:  Diagnosis Date  . Asthma    No probnlems recently  . 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 Diabetes mellitus    Type II per Dr [name]  (patient said type I)  . Fibroid   . GERD (gastroesophageal reflux disease)   . Glaucoma   . Hyperlipidemia   . Hypertension   . Iron deficiency anemia 03/09/2016  . Peripheral vascular disease ([place])   . Pneumonia 2006  . Shortness of breath dyspnea    with exdrtion, "Walkling too fast"  . Stroke [facility])    no residual    Past Surgical History:  Procedure Laterality Date  . ABDOMINAL HYSTERECTOMY    . BIOPSY  06/10/2017   Procedure: BIOPSY;  Surgeon: Ronnette Juniper, MD;  Location: [place];  Service: Gastroenterology;;  . CERVICAL FUSION     with graft from hip  . COLONOSCOPY  July 09, 2012  . DIRECT LARYNGOSCOPY N/A 06/07/2014   Procedure: DIRECT LARYNGOSCOPY with BIOPSY and EXCISION VOLLECULAR CYST;  Surgeon: Ruby Cola, MD;  Location[facility];  Service: ENT;  Laterality: N/A;   . ESOPHAGOGASTRODUODENOSCOPY (EGD) WITH PROPOFOL Left 06/10/2017   Procedure: ESOPHAGOGASTRODUODENOSCOPY (EGD) WITH PROPOFOL;  Surgeon: Ronnette Juniper, MD;  Location: [place];  Service: Gastroenterology;  Laterality: Left;  . FLEXIBLE SIGMOIDOSCOPY Left 06/10/2017   Procedure: FLEXIBLE SIGMOIDOSCOPY;  Surgeon: Ronnette Juniper, MD;  Location: [place];  Service: Gastroenterology;  Laterality: Left;  . LOOP RECORDER INSERTION N/A 06/20/2016   Procedure: Loop Recorder Insertion;  Surgeon: [name], MD;  Location: [place] CV [facility];  Service: Cardiovascular;  Laterality: N/A;  . REFRACTIVE SURGERY Bilateral    both eyes  . SPINE SURGERY     lumbar    Family History  Problem Relation Age of Onset  . Cancer Mother   . Heart disease Mother   . Diabetes Father   . Cancer Brother   . Cancer Brother   . Throat cancer Brother     Social History:  reports that she has been smoking cigarettes. She has a 30.00 pack-year smoking history. She has never used smokeless tobacco. She reports current alcohol use. She reports previous drug use. Drug: Methylphenidate.  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     Renal function: Her creatinine is continuing to be high and followed by nephrologist every 4 months  Also has proteinuria, reportedly from diabetes and is still on losartan   Lab Results  Component Value Date    CREATININE 2.96 (H) 11/04/2018   CREATININE 2.75 (H) 07/30/2018   CREATININE 3.08 (H) 06/25/2018    Electrolyte abnormalities: No problems with low sodium or hyperkalemia  Lab Results  Component Value Date   NA 137 11/04/2018   K 4.6 11/04/2018   CL 108 11/04/2018   CO2 22 11/04/2018     HYPERLIPIDEMIA: The lipid abnormality consists of elevated LDL controlled with Lipitor 20 mg .   Her baseline LDL was 202  Her last LDL done by PCP in June 2020 was 102  Lab Results  Component Value Date   CHOL 150 07/05/2017   HDL 52.90 07/05/2017   LDLCALC 67 07/05/2017   TRIG 155.0 (H) 07/05/2017   CHOLHDL 3 07/05/2017    Vitamin D: Taking 50,000 unit dosage once a month This is monitored by PCP    Lab Results  Component Value Date   VD25OH 40.22 09/13/2016       Last diabetic foot exam was done in 7/20  Takes Gabapentin 100mg  twice daily for lower leg leg pains Has taken this long-term     Examination:   BP (!) 144/60 (BP Location: Left Arm, Patient Position: Sitting, Cuff Size: Normal)   Pulse 78   Ht 5' 3.25" (1.607 m)   Wt 147 lb 12.8 oz (67 kg)   SpO2 96%   BMI 25.98 kg/m   Body mass index is 25.98 kg/m.     ASSESSMENT/ PLAN:   HYPERTENSION:   Better controlled now and likely on amlodipine, low-dose losartan and labetalol although she is not sure  Her blood pressure is being managed by nephrologist   Diabetes type 2 on insulin See history of present illness for detailed discussion of current diabetes management, blood sugar patterns and problems identified Her A1c is 7.6 and higher than before  She is on low-dose basal insulin but still requiring relatively large amounts of mealtime insulin Because of her eating cereal in the morning she still has readings over 200 after breakfast despite using 10 units of regular insulin Unclear what her blood sugars are after dinner  She will need to go up to 12 units regular insulin before breakfast for eating  cereal unless she is eating other types of foods  For now we will continue the same dose of Lantus insulin  Discussed needing to rotate the times of her glucose monitoring and not just in the morning as she is doing now  She will follow-up in 3 months unless having more problems    CKD: This appears to be stable although creatinine is still about 3, to follow-up with nephrologist this month   Patient Instructions  12 Reg insulin in am   Elayne Snare 11/07/2018, 11:23 AM     Note: This office note was prepared with Dragon voice recognition system technology. Any transcriptional errors that result from this process are unintentional.

## 2018-11-07 NOTE — Patient Instructions (Addendum)
12 Reg insulin in am.  Check blood sugars on waking up 2-3 days a week  Also check blood sugars about 2 hours after meals and do this after different meals by rotation  Recommended blood sugar levels on waking up are 90-130 and about 2 hours after meal is 130-180  Please bring your blood sugar monitor to each visit, thank you

## 2018-11-10 ENCOUNTER — Ambulatory Visit (INDEPENDENT_AMBULATORY_CARE_PROVIDER_SITE_OTHER): Payer: Medicare Other | Admitting: Podiatry

## 2018-11-10 ENCOUNTER — Encounter: Payer: Self-pay | Admitting: Podiatry

## 2018-11-10 ENCOUNTER — Other Ambulatory Visit: Payer: Self-pay

## 2018-11-10 DIAGNOSIS — L600 Ingrowing nail: Secondary | ICD-10-CM

## 2018-11-10 DIAGNOSIS — E1151 Type 2 diabetes mellitus with diabetic peripheral angiopathy without gangrene: Secondary | ICD-10-CM

## 2018-11-10 NOTE — Progress Notes (Signed)
Subjective: Julie Brewer is a 68 y.o.  female returns to office today for follow up evaluation after having left Hallux medial permanent nail avulsion performed. Patient has been soaking using epsom salts and applying topical antibiotic covered with bandaid daily. Patient denies fevers, chills, nausea, vomiting. Denies any calf pain, chest pain, SOB.   Objective:  Vitals: Reviewed  General: Well developed, nourished, in no acute distress, alert and oriented x3   Dermatology: Skin is warm, dry and supple bilateral. Left medial hallux nail border appears to be clean, dry, with mild granular tissue and surrounding scab. There is no surrounding erythema, edema, drainage/purulence. The remaining nails appear unremarkable at this time. There are no other lesions or other signs of infection present.  Neurovascular status: Intact. No lower extremity swelling; No pain with calf compression bilateral.  Musculoskeletal: Decreased tenderness to palpation of the left medial hallux nail fold(s). Muscular strength within normal limits bilateral.   Assesement and Plan: S/p partial nail avulsion, doing well.   -Continue soaking in epsom salts twice a day followed by antibiotic ointment and a band-aid. Can leave uncovered at night. Continue this until completely healed.  -If the area has not healed in 2 weeks, call the office for follow-up appointment, or sooner if any problems arise.  -Monitor for any signs/symptoms of infection. Call the office immediately if any occur or go directly to the emergency room. Call with any questions/concerns.  Boneta Lucks, DPM

## 2018-11-10 NOTE — Progress Notes (Signed)
[facility] Summary Report / Loop Recorder 

## 2018-11-19 DIAGNOSIS — D649 Anemia, unspecified: Secondary | ICD-10-CM | POA: Diagnosis not present

## 2018-11-19 DIAGNOSIS — R809 Proteinuria, unspecified: Secondary | ICD-10-CM | POA: Diagnosis not present

## 2018-11-19 DIAGNOSIS — Z720 Tobacco use: Secondary | ICD-10-CM | POA: Diagnosis not present

## 2018-11-19 DIAGNOSIS — I129 Hypertensive chronic kidney disease with stage 1 through stage 4 chronic kidney disease, or unspecified chronic kidney disease: Secondary | ICD-10-CM | POA: Diagnosis not present

## 2018-11-19 DIAGNOSIS — I5030 Unspecified diastolic (congestive) heart failure: Secondary | ICD-10-CM | POA: Diagnosis not present

## 2018-11-19 DIAGNOSIS — N2581 Secondary hyperparathyroidism of renal origin: Secondary | ICD-10-CM | POA: Diagnosis not present

## 2018-11-19 DIAGNOSIS — E1122 Type 2 diabetes mellitus with diabetic chronic kidney disease: Secondary | ICD-10-CM | POA: Diagnosis not present

## 2018-11-19 DIAGNOSIS — N184 Chronic kidney disease, stage 4 (severe): Secondary | ICD-10-CM | POA: Diagnosis not present

## 2018-11-30 ENCOUNTER — Other Ambulatory Visit: Payer: Self-pay | Admitting: Vascular Surgery

## 2018-12-01 ENCOUNTER — Other Ambulatory Visit: Payer: Self-pay | Admitting: Endocrinology

## 2018-12-02 ENCOUNTER — Other Ambulatory Visit: Payer: Self-pay | Admitting: Endocrinology

## 2018-12-06 LAB — CUP PACEART REMOTE DEVICE CHECK
Date Time Interrogation Session: 20201107105959
Implantable Pulse Generator Implant Date: 20180523

## 2018-12-08 ENCOUNTER — Ambulatory Visit (INDEPENDENT_AMBULATORY_CARE_PROVIDER_SITE_OTHER): Payer: Medicare Other | Admitting: *Deleted

## 2018-12-08 DIAGNOSIS — R55 Syncope and collapse: Secondary | ICD-10-CM | POA: Diagnosis not present

## 2018-12-15 ENCOUNTER — Other Ambulatory Visit: Payer: Self-pay | Admitting: Endocrinology

## 2018-12-17 ENCOUNTER — Other Ambulatory Visit: Payer: Self-pay

## 2018-12-17 MED ORDER — BASAGLAR KWIKPEN 100 UNIT/ML ~~LOC~~ SOPN: 14.0000 [IU] | PEN_INJECTOR | Freq: Every day | SUBCUTANEOUS | 1 refills | Status: DC

## 2018-12-23 ENCOUNTER — Other Ambulatory Visit: Payer: Self-pay | Admitting: Endocrinology

## 2018-12-29 ENCOUNTER — Telehealth: Payer: Self-pay | Admitting: Cardiovascular Disease

## 2018-12-29 NOTE — Telephone Encounter (Signed)
Will route to MD. Visually impaired.

## 2018-12-29 NOTE — Telephone Encounter (Signed)
New message   Patient's daughter states that she will be coming with patient on 12/31/18 to visit  she states that the patient is visually impaired.

## 2018-12-31 ENCOUNTER — Encounter: Payer: Self-pay | Admitting: Cardiovascular Disease

## 2018-12-31 ENCOUNTER — Ambulatory Visit (INDEPENDENT_AMBULATORY_CARE_PROVIDER_SITE_OTHER): Payer: Medicare Other | Admitting: Cardiovascular Disease

## 2018-12-31 ENCOUNTER — Other Ambulatory Visit: Payer: Self-pay

## 2018-12-31 VITALS — BP 152/58 | HR 83 | Temp 96.0°F | Ht 63.25 in | Wt 149.6 lb

## 2018-12-31 DIAGNOSIS — E785 Hyperlipidemia, unspecified: Secondary | ICD-10-CM

## 2018-12-31 DIAGNOSIS — I10 Essential (primary) hypertension: Secondary | ICD-10-CM

## 2018-12-31 DIAGNOSIS — I779 Disorder of arteries and arterioles, unspecified: Secondary | ICD-10-CM | POA: Diagnosis not present

## 2018-12-31 DIAGNOSIS — R55 Syncope and collapse: Secondary | ICD-10-CM | POA: Diagnosis not present

## 2018-12-31 NOTE — Progress Notes (Signed)
12/31/2018 Julie Brewer   Mar 15, 1950  [id number]  Primary Physician [name], MD Primary Cardiologist: Lorretta Harp MD [name], [place]  HPI:  Julie Brewer is a 68 y.o.  moderately overweight separated African-American female mother of one child, grandmother 55 grandchildren referred by her PCP, [name], for evaluation of a recent episode of syncope.She is accompanied by herdaughter Petitesatoday.  I last saw her in the office 12/17/2017. Rk factors include 50-pack-years of tobacco abuse continued to smoke one pack per day. She has history of hypertension, diabetes and hyperlipidemia. She has never had a heart attack but does have a history of "TIAs. She denies chest pain but does get short of breath. She has peripheral arterial disease and is followed by Dr. Trula Slade for claudication which he is minimally symptomatic from. Approximately one week ago she was witnessed to have lost consciousness while sitting in a chair for 45 minutes. There was no loss of bowel or bladder control or tonic clonic activity. We do not know her serum glucose level at that time. She did have some nausea and vomiting after she woke up.She was seen by Kerin Ransom on 4/60/18 which was several weeks after a recurrent episode of syncope and also describes an episode of syncope several weeks ago after eating dinner. Her husband was there. She was noted to be diaphoretic. There are no other symptoms. A 2-D echo was performed 05/15/16 which was entirely normal.  She had a loop recorder implanted by Dr. Sallyanne Kuster 06/20/16. She's had no further episodes of syncope since that time.  Since I saw her a year ago she is remained stable.  She denies chest pain, shortness of breath or further episodes of syncope.  There have been no arrhythmias demonstrated on her loop recorder.   Current Meds  Medication Sig  . amLODipine (NORVASC) 5 MG tablet Take 5 mg by mouth daily.  Marland Kitchen aspirin 325 MG EC tablet  Take 325 mg by mouth daily.   Marland Kitchen atorvastatin (LIPITOR) 20 MG tablet Take 1 tablet by mouth once daily  . cilostazol (PLETAL) 100 MG tablet Take 1 tablet by mouth twice daily  . docusate sodium (COLACE) 100 MG capsule Take 100 mg by mouth 2 (two) times daily.  . furosemide (LASIX) 40 MG tablet Take 40 mg by mouth daily.  Marland Kitchen gabapentin (NEURONTIN) 100 MG capsule Take 1 capsule by mouth twice daily  . glucose blood (FREESTYLE LITE) test strip USE AS INSTRUCTED TO CHECK BLOOD SUGAR ONCE DAILY; E11.8  . Insulin Glargine (BASAGLAR KWIKPEN) 100 UNIT/ML SOPN Inject 0.14 mLs (14 Units total) into the skin daily. Inject 14 units under the skin once daily. *Replaces Lantus*  . insulin regular (NOVOLIN R RELION) 100 units/mL injection Inject into the skin 2 (two) times daily before a meal. INJECT 10 UNITS UNDER THE SKIN AT BREAKFAST, AND 5-7 UNITS IN THE EVENING.  . IRON PO Take 1 tablet by mouth 2 (two) times a day.   . labetalol (NORMODYNE) 100 MG tablet Take 1 tablet by mouth twice daily  . losartan (COZAAR) 25 MG tablet Take 1 tablet by mouth once daily.  Marland Kitchen omeprazole (PRILOSEC) 20 MG capsule TAKE 1 CAPSULE BY MOUTH ONCE DAILY ( DUE FOR OFFICE VISIT THIS MONTH)  . pantoprazole (PROTONIX) 40 MG tablet Take 1 tablet (40 mg total) by mouth daily for 30 days.  . polyethylene glycol (MIRALAX / GLYCOLAX) 17 g packet Take 17 g by mouth daily as needed  for moderate constipation.  . Vitamin D, Ergocalciferol, (DRISDOL) 1.25 MG (50000 UT) CAPS capsule TAKE ONE CAPSULE BY MOUTH EVERY 7 DAYS (Patient taking differently: Take 50,000 Units by mouth every 30 (thirty) days. Pt takes on 1st day of month)     Allergies  Allergen Reactions  . Morphine And Related Other (See Comments)    Hallucenations   . Penicillins Rash and Other (See Comments)    Swelling    Social History   Socioeconomic History  . Marital status: Legally Separated    Spouse name: Not on file  . Number of children: Not on file  . Years of  education: Not on file  . Highest education level: Not on file  Occupational History  . Not on file  Social Needs  . Financial resource strain: Not on file  . Food insecurity    Worry: Not on file    Inability: Not on file  . Transportation needs    Medical: Not on file    Non-medical: Not on file  Tobacco Use  . Smoking status: Current Every Day Smoker    Packs/day: 1.00    Years: 30.00    Pack years: 30.00    Types: Cigarettes  . Smokeless tobacco: Never Used  Substance and Sexual Activity  . Alcohol use: Yes    Comment: socially  . Drug use: Not Currently    Types: Methylphenidate  . Sexual activity: Never    Birth control/protection: Post-menopausal, Surgical    Comment: Hysterectomy  Lifestyle  . Physical activity    Days per week: Not on file    Minutes per session: Not on file  . Stress: Not on file  Relationships  . Social Herbalist on phone: Not on file    Gets together: Not on file    Attends religious service: Not on file    Active member of club or organization: Not on file    Attends meetings of clubs or organizations: Not on file    Relationship status: Not on file  . Intimate partner violence    Fear of current or ex partner: Not on file    Emotionally abused: Not on file    Physically abused: Not on file    Forced sexual activity: Not on file  Other Topics Concern  . Not on file  Social History Narrative  . Not on file     Review of Systems: General: negative for chills, fever, night sweats or weight changes.  Cardiovascular: negative for chest pain, dyspnea on exertion, edema, orthopnea, palpitations, paroxysmal nocturnal dyspnea or shortness of breath Dermatological: negative for rash Respiratory: negative for cough or wheezing Urologic: negative for hematuria Abdominal: negative for nausea, vomiting, diarrhea, bright red blood per rectum, melena, or hematemesis Neurologic: negative for visual changes, syncope, or dizziness All  other systems reviewed and are otherwise negative except as noted above.    Blood pressure (!) 152/58, pulse 83, temperature (!) 96 F (35.6 C), height 5' 3.25" (1.607 m), weight 149 lb 9.6 oz (67.9 kg), SpO2 99 %.  General appearance: alert and no distress Neck: no adenopathy, no carotid bruit, no JVD, supple, symmetrical, trachea midline and thyroid not enlarged, symmetric, no tenderness/mass/nodules Lungs: clear to auscultation bilaterally Heart: regular rate and rhythm, S1, S2 normal, no murmur, click, rub or gallop Extremities: extremities normal, atraumatic, no cyanosis or edema Pulses: 2+ and symmetric Skin: Skin color, texture, turgor normal. No rashes or lesions Neurologic: Alert and oriented X  3, normal strength and tone. Normal symmetric reflexes. Normal coordination and gait  EKG sinus rhythm at 81 with septal Q waves.  I personally reviewed this EKG.  ASSESSMENT AND PLAN:   Dyslipidemia History of dyslipidemia on statin therapy with lipid profile performed 07/02/2018 revealing total cholesterol 132, LDL 45 and HDL 68  Essential hypertension History of essential hypertension blood pressure measured today 152/58 she is on amlodipine, and labetalol as well as losartan.  Syncope and collapse History of syncope and collapse in the past status post loop recorder insertion by Dr. Sallyanne Kuster 06/20/2016 without evidence of arrhythmia.  She is had no recurrent symptoms of syncope.      Lorretta Harp MD FACP,FACC,FAHA, [facility] 12/31/2018 11:54 AM

## 2018-12-31 NOTE — Assessment & Plan Note (Signed)
History of essential hypertension blood pressure measured today 152/58 she is on amlodipine, and labetalol as well as losartan.

## 2018-12-31 NOTE — Patient Instructions (Signed)
Medication Instructions:  Your physician recommends that you continue on your current medications as directed. Please refer to the Current Medication list given to you today.  If you need a refill on your cardiac medications before your next appointment, please call your pharmacy.   Lab work: NONE  Testing/Procedures: NONE  Follow-Up: At [facility], you and your health needs are our priority.  As part of our continuing mission to provide you with exceptional heart care, we have created designated Provider Care Teams.  These Care Teams include your primary Cardiologist (physician) and Advanced Practice Providers (APPs -  Physician Assistants and Nurse Practitioners) who all work together to provide you with the care you need, when you need it. You may see Quay Burow, MD or one of the following Advanced Practice Providers on your designated Care Team:    Kerin Ransom, PA-C  [place], [place]  Coletta Memos, [place] Your physician wants you to follow-up as needed.

## 2018-12-31 NOTE — Assessment & Plan Note (Signed)
History of dyslipidemia on statin therapy with lipid profile performed 07/02/2018 revealing total cholesterol 132, LDL 45 and HDL 68

## 2018-12-31 NOTE — Assessment & Plan Note (Signed)
History of syncope and collapse in the past status post loop recorder insertion by Dr. Sallyanne Kuster 06/20/2016 without evidence of arrhythmia.  She is had no recurrent symptoms of syncope.

## 2019-01-03 NOTE — Progress Notes (Signed)
[facility] Summary Report / Loop Recorder 

## 2019-01-08 ENCOUNTER — Ambulatory Visit (INDEPENDENT_AMBULATORY_CARE_PROVIDER_SITE_OTHER): Payer: Medicare Other | Admitting: *Deleted

## 2019-01-08 DIAGNOSIS — Z8673 Personal history of transient ischemic attack (TIA), and cerebral infarction without residual deficits: Secondary | ICD-10-CM | POA: Diagnosis not present

## 2019-01-08 LAB — CUP PACEART REMOTE DEVICE CHECK
Date Time Interrogation Session: 20201210122807
Implantable Pulse Generator Implant Date: 20180523

## 2019-01-19 ENCOUNTER — Other Ambulatory Visit: Payer: Self-pay

## 2019-01-19 ENCOUNTER — Ambulatory Visit (INDEPENDENT_AMBULATORY_CARE_PROVIDER_SITE_OTHER): Payer: Medicare Other | Admitting: Podiatry

## 2019-01-19 DIAGNOSIS — B351 Tinea unguium: Secondary | ICD-10-CM | POA: Diagnosis not present

## 2019-01-19 DIAGNOSIS — L84 Corns and callosities: Secondary | ICD-10-CM | POA: Diagnosis not present

## 2019-01-19 DIAGNOSIS — M79674 Pain in right toe(s): Secondary | ICD-10-CM

## 2019-01-19 DIAGNOSIS — E1151 Type 2 diabetes mellitus with diabetic peripheral angiopathy without gangrene: Secondary | ICD-10-CM | POA: Diagnosis not present

## 2019-01-19 DIAGNOSIS — M79675 Pain in left toe(s): Secondary | ICD-10-CM | POA: Diagnosis not present

## 2019-01-20 ENCOUNTER — Encounter: Payer: Self-pay | Admitting: Podiatry

## 2019-01-20 NOTE — Progress Notes (Signed)
  Subjective:  Patient ID: Julie Brewer, female    DOB: 12-13-1950,  MRN: [id number]  Chief Complaint  Patient presents with  . Foot Pain    pt is here for routine foot care, pt is also looking to get a nail trim   68 y.o. female returns for the above complaint.  Patient is a type 2 diabetic with last A1c that is unknown.  She states that she is getting it checked soon.  Patient is here today for diabetic foot care with thickened elongated mycotic toenails x10.  Patient states is painful to walk on.  She states that it causes her pain because of the exterior.  Patient states that she is not able to cut it herself.  She also has secondary complaint of submet 3 calluses x2 bilaterally.  She states it caused pain to walk on them.  She has not tried any over-the-counter medications.  She would like to have them debrided down to give her some pain relief.  Objective:  There were no vitals filed for this visit. Podiatric Exam: Vascular: dorsalis pedis and posterior tibial pulses are palpable bilateral. Capillary return is immediate. Temperature gradient is WNL. Skin turgor WNL  Sensorium: Normal Semmes Weinstein monofilament test. Normal tactile sensation bilaterally. Nail Exam: Pt has thick disfigured discolored nails with subungual debris noted bilateral entire nail hallux through fifth toenails Ulcer Exam: There is no evidence of ulcer or pre-ulcerative changes or infection. Orthopedic Exam: Muscle tone and strength are WNL. No limitations in general ROM. No crepitus or effusions noted. HAV  B/L.  Hammer toes 2-5  B/L. Skin: No Porokeratosis. No infection or ulcers.  Submet 3 hyperkeratotic lesion/callus x2.  Pain on palpation.  Assessment & Plan:  Patient was evaluated and treated and all questions answered.  Bilateral submet 3 hyperkeratotic lesion/callus x2 -Using a chisel blade and a handle, hyperkeratotic lesions were aggressively debrided down to healthy striated tissue.  No  complications noted.  No pinpoint bleeding noted.  Onychomycosis with pain  -Nails palliatively debrided as below. -Educated on self-care  Procedure: Nail Debridement Rationale: pain  Type of Debridement: manual, sharp debridement. Instrumentation: Nail nipper, rotary burr. Number of Nails: 10  Procedures and Treatment: Consent by patient was obtained for treatment procedures. The patient understood the discussion of treatment and procedures well. All questions were answered thoroughly reviewed. Debridement of mycotic and hypertrophic toenails, 1 through 5 bilateral and clearing of subungual debris. No ulceration, no infection noted.  Return Visit-Office Procedure: Patient instructed to return to the office for a follow up visit 3 months for continued evaluation and treatment.  Boneta Lucks, DPM    No follow-ups on file.

## 2019-02-06 ENCOUNTER — Emergency Department (HOSPITAL_COMMUNITY): Payer: Medicare Other

## 2019-02-06 ENCOUNTER — Other Ambulatory Visit: Payer: Self-pay

## 2019-02-06 ENCOUNTER — Observation Stay (HOSPITAL_COMMUNITY): Payer: Medicare Other

## 2019-02-06 ENCOUNTER — Inpatient Hospital Stay (HOSPITAL_COMMUNITY)
Admission: EM | Admit: 2019-02-06 | Discharge: 2019-02-20 | DRG: 637 | Disposition: A | Discharge status: Home / Self Care | Payer: Medicare Other | Attending: Internal Medicine | Admitting: Internal Medicine

## 2019-02-06 DIAGNOSIS — R945 Abnormal results of liver function studies: Secondary | ICD-10-CM | POA: Diagnosis not present

## 2019-02-06 DIAGNOSIS — E1165 Type 2 diabetes mellitus with hyperglycemia: Secondary | ICD-10-CM | POA: Diagnosis not present

## 2019-02-06 DIAGNOSIS — R609 Edema, unspecified: Secondary | ICD-10-CM | POA: Diagnosis not present

## 2019-02-06 DIAGNOSIS — K219 Gastro-esophageal reflux disease without esophagitis: Secondary | ICD-10-CM | POA: Diagnosis present

## 2019-02-06 DIAGNOSIS — E785 Hyperlipidemia, unspecified: Secondary | ICD-10-CM | POA: Diagnosis present

## 2019-02-06 DIAGNOSIS — J9601 Acute respiratory failure with hypoxia: Secondary | ICD-10-CM

## 2019-02-06 DIAGNOSIS — D509 Iron deficiency anemia, unspecified: Secondary | ICD-10-CM | POA: Diagnosis present

## 2019-02-06 DIAGNOSIS — J069 Acute upper respiratory infection, unspecified: Secondary | ICD-10-CM | POA: Diagnosis not present

## 2019-02-06 DIAGNOSIS — J1282 Pneumonia due to coronavirus disease 2019: Secondary | ICD-10-CM | POA: Diagnosis present

## 2019-02-06 DIAGNOSIS — E875 Hyperkalemia: Secondary | ICD-10-CM | POA: Diagnosis not present

## 2019-02-06 DIAGNOSIS — E8770 Fluid overload, unspecified: Secondary | ICD-10-CM | POA: Diagnosis not present

## 2019-02-06 DIAGNOSIS — R0682 Tachypnea, not elsewhere classified: Secondary | ICD-10-CM | POA: Diagnosis not present

## 2019-02-06 DIAGNOSIS — E119 Type 2 diabetes mellitus without complications: Secondary | ICD-10-CM | POA: Diagnosis not present

## 2019-02-06 DIAGNOSIS — F17210 Nicotine dependence, cigarettes, uncomplicated: Secondary | ICD-10-CM | POA: Diagnosis present

## 2019-02-06 DIAGNOSIS — H02843 Edema of right eye, unspecified eyelid: Secondary | ICD-10-CM | POA: Diagnosis present

## 2019-02-06 DIAGNOSIS — R55 Syncope and collapse: Secondary | ICD-10-CM | POA: Diagnosis not present

## 2019-02-06 DIAGNOSIS — Z66 Do not resuscitate: Secondary | ICD-10-CM | POA: Diagnosis present

## 2019-02-06 DIAGNOSIS — E872 Acidosis: Secondary | ICD-10-CM | POA: Diagnosis not present

## 2019-02-06 DIAGNOSIS — Z6824 Body mass index (BMI) 24.0-24.9, adult: Secondary | ICD-10-CM

## 2019-02-06 DIAGNOSIS — E1122 Type 2 diabetes mellitus with diabetic chronic kidney disease: Secondary | ICD-10-CM | POA: Diagnosis present

## 2019-02-06 DIAGNOSIS — J9811 Atelectasis: Secondary | ICD-10-CM | POA: Diagnosis not present

## 2019-02-06 DIAGNOSIS — N179 Acute kidney failure, unspecified: Secondary | ICD-10-CM

## 2019-02-06 DIAGNOSIS — I129 Hypertensive chronic kidney disease with stage 1 through stage 4 chronic kidney disease, or unspecified chronic kidney disease: Secondary | ICD-10-CM | POA: Diagnosis present

## 2019-02-06 DIAGNOSIS — Z8701 Personal history of pneumonia (recurrent): Secondary | ICD-10-CM

## 2019-02-06 DIAGNOSIS — G4089 Other seizures: Secondary | ICD-10-CM | POA: Diagnosis not present

## 2019-02-06 DIAGNOSIS — E11649 Type 2 diabetes mellitus with hypoglycemia without coma: Principal | ICD-10-CM | POA: Diagnosis present

## 2019-02-06 DIAGNOSIS — E1169 Type 2 diabetes mellitus with other specified complication: Secondary | ICD-10-CM | POA: Diagnosis not present

## 2019-02-06 DIAGNOSIS — H543 Unqualified visual loss, both eyes: Secondary | ICD-10-CM | POA: Diagnosis present

## 2019-02-06 DIAGNOSIS — J439 Emphysema, unspecified: Secondary | ICD-10-CM | POA: Diagnosis present

## 2019-02-06 DIAGNOSIS — D631 Anemia in chronic kidney disease: Secondary | ICD-10-CM | POA: Diagnosis not present

## 2019-02-06 DIAGNOSIS — R42 Dizziness and giddiness: Secondary | ICD-10-CM | POA: Diagnosis not present

## 2019-02-06 DIAGNOSIS — R0602 Shortness of breath: Secondary | ICD-10-CM

## 2019-02-06 DIAGNOSIS — Z833 Family history of diabetes mellitus: Secondary | ICD-10-CM

## 2019-02-06 DIAGNOSIS — Z794 Long term (current) use of insulin: Secondary | ICD-10-CM | POA: Diagnosis not present

## 2019-02-06 DIAGNOSIS — R569 Unspecified convulsions: Secondary | ICD-10-CM | POA: Diagnosis not present

## 2019-02-06 DIAGNOSIS — Z981 Arthrodesis status: Secondary | ICD-10-CM

## 2019-02-06 DIAGNOSIS — H409 Unspecified glaucoma: Secondary | ICD-10-CM | POA: Diagnosis present

## 2019-02-06 DIAGNOSIS — Z90710 Acquired absence of both cervix and uterus: Secondary | ICD-10-CM

## 2019-02-06 DIAGNOSIS — E663 Overweight: Secondary | ICD-10-CM | POA: Diagnosis present

## 2019-02-06 DIAGNOSIS — E871 Hypo-osmolality and hyponatremia: Secondary | ICD-10-CM | POA: Diagnosis not present

## 2019-02-06 DIAGNOSIS — R6510 Systemic inflammatory response syndrome (SIRS) of non-infectious origin without acute organ dysfunction: Secondary | ICD-10-CM | POA: Diagnosis present

## 2019-02-06 DIAGNOSIS — U071 COVID-19: Secondary | ICD-10-CM | POA: Diagnosis present

## 2019-02-06 DIAGNOSIS — R000 Tachycardia, unspecified: Secondary | ICD-10-CM | POA: Diagnosis not present

## 2019-02-06 DIAGNOSIS — Z79899 Other long term (current) drug therapy: Secondary | ICD-10-CM

## 2019-02-06 DIAGNOSIS — E1151 Type 2 diabetes mellitus with diabetic peripheral angiopathy without gangrene: Secondary | ICD-10-CM | POA: Diagnosis present

## 2019-02-06 DIAGNOSIS — Z880 Allergy status to penicillin: Secondary | ICD-10-CM

## 2019-02-06 DIAGNOSIS — I10 Essential (primary) hypertension: Secondary | ICD-10-CM | POA: Diagnosis not present

## 2019-02-06 DIAGNOSIS — N184 Chronic kidney disease, stage 4 (severe): Secondary | ICD-10-CM | POA: Diagnosis present

## 2019-02-06 DIAGNOSIS — N183 Chronic kidney disease, stage 3 unspecified: Secondary | ICD-10-CM | POA: Diagnosis not present

## 2019-02-06 DIAGNOSIS — Z885 Allergy status to narcotic agent status: Secondary | ICD-10-CM

## 2019-02-06 DIAGNOSIS — R7989 Other specified abnormal findings of blood chemistry: Secondary | ICD-10-CM | POA: Diagnosis not present

## 2019-02-06 DIAGNOSIS — Z808 Family history of malignant neoplasm of other organs or systems: Secondary | ICD-10-CM

## 2019-02-06 DIAGNOSIS — E861 Hypovolemia: Secondary | ICD-10-CM | POA: Diagnosis not present

## 2019-02-06 DIAGNOSIS — N189 Chronic kidney disease, unspecified: Secondary | ICD-10-CM | POA: Diagnosis not present

## 2019-02-06 DIAGNOSIS — Z8249 Family history of ischemic heart disease and other diseases of the circulatory system: Secondary | ICD-10-CM

## 2019-02-06 DIAGNOSIS — Z8673 Personal history of transient ischemic attack (TIA), and cerebral infarction without residual deficits: Secondary | ICD-10-CM | POA: Diagnosis not present

## 2019-02-06 DIAGNOSIS — N2581 Secondary hyperparathyroidism of renal origin: Secondary | ICD-10-CM | POA: Diagnosis present

## 2019-02-06 DIAGNOSIS — Z7982 Long term (current) use of aspirin: Secondary | ICD-10-CM

## 2019-02-06 DIAGNOSIS — D649 Anemia, unspecified: Secondary | ICD-10-CM | POA: Diagnosis not present

## 2019-02-06 DIAGNOSIS — I499 Cardiac arrhythmia, unspecified: Secondary | ICD-10-CM | POA: Diagnosis not present

## 2019-02-06 LAB — CBC WITH DIFFERENTIAL/PLATELET
Abs Immature Granulocytes: 0.02 10*3/uL (ref 0.00–0.07)
Basophils Absolute: 0 10*3/uL (ref 0.0–0.1)
Basophils Relative: 0 %
Eosinophils Absolute: 0.8 10*3/uL — ABNORMAL HIGH (ref 0.0–0.5)
Eosinophils Relative: 11 %
HCT: 30.1 % — ABNORMAL LOW (ref 36.0–46.0)
Hemoglobin: 9.2 g/dL — ABNORMAL LOW (ref 12.0–15.0)
Immature Granulocytes: 0 %
Lymphocytes Relative: 6 %
Lymphs Abs: 0.4 10*3/uL — ABNORMAL LOW (ref 0.7–4.0)
MCH: 25.8 pg — ABNORMAL LOW (ref 26.0–34.0)
MCHC: 30.6 g/dL (ref 30.0–36.0)
MCV: 84.6 fL (ref 80.0–100.0)
Monocytes Absolute: 0.7 10*3/uL (ref 0.1–1.0)
Monocytes Relative: 11 %
Neutro Abs: 5 10*3/uL (ref 1.7–7.7)
Neutrophils Relative %: 72 %
Platelets: 224 10*3/uL (ref 150–400)
RBC: 3.56 MIL/uL — ABNORMAL LOW (ref 3.87–5.11)
RDW: 16.2 % — ABNORMAL HIGH (ref 11.5–15.5)
WBC: 6.9 10*3/uL (ref 4.0–10.5)
nRBC: 0 % (ref 0.0–0.2)

## 2019-02-06 LAB — COMPREHENSIVE METABOLIC PANEL
ALT: 19 U/L (ref 0–44)
AST: 15 U/L (ref 15–41)
Albumin: 3 g/dL — ABNORMAL LOW (ref 3.5–5.0)
Alkaline Phosphatase: 88 U/L (ref 38–126)
Anion gap: 10 (ref 5–15)
BUN: 56 mg/dL — ABNORMAL HIGH (ref 8–23)
CO2: 18 mmol/L — ABNORMAL LOW (ref 22–32)
Calcium: 8.4 mg/dL — ABNORMAL LOW (ref 8.9–10.3)
Chloride: 113 mmol/L — ABNORMAL HIGH (ref 98–111)
Creatinine, Ser: 4.34 mg/dL — ABNORMAL HIGH (ref 0.44–1.00)
GFR calc Af Amer: 12 mL/min — ABNORMAL LOW (ref >60)
GFR calc non Af Amer: 10 mL/min — ABNORMAL LOW (ref >60)
Glucose, Bld: 139 mg/dL — ABNORMAL HIGH (ref 70–99)
Potassium: 4.9 mmol/L (ref 3.5–5.1)
Sodium: 141 mmol/L (ref 135–145)
Total Bilirubin: 0.2 mg/dL — ABNORMAL LOW (ref 0.3–1.2)
Total Protein: 5.9 g/dL — ABNORMAL LOW (ref 6.5–8.1)

## 2019-02-06 LAB — SARS CORONAVIRUS 2 (TAT 6-24 HRS): SARS Coronavirus 2: POSITIVE — AB

## 2019-02-06 LAB — CBG MONITORING, ED: Glucose-Capillary: 229 mg/dL — ABNORMAL HIGH (ref 70–99)

## 2019-02-06 MED ORDER — ONDANSETRON HCL 4 MG PO TABS
4.0000 mg | ORAL_TABLET | Freq: Four times a day (QID) | ORAL | Status: DC | PRN
  Administered 2019-02-14: 4 mg via ORAL
  Filled 2019-02-06 (×2): qty 1

## 2019-02-06 MED ORDER — HEPARIN SODIUM (PORCINE) 5000 UNIT/ML IJ SOLN
5000.0000 [IU] | Freq: Three times a day (TID) | INTRAMUSCULAR | Status: DC
  Administered 2019-02-06 – 2019-02-19 (×39): 5000 [IU] via SUBCUTANEOUS
  Filled 2019-02-06 (×40): qty 1

## 2019-02-06 MED ORDER — ACETAMINOPHEN 325 MG PO TABS
650.0000 mg | ORAL_TABLET | Freq: Four times a day (QID) | ORAL | Status: DC | PRN
  Administered 2019-02-08 – 2019-02-13 (×5): 650 mg via ORAL
  Filled 2019-02-06 (×5): qty 2

## 2019-02-06 MED ORDER — LABETALOL HCL 100 MG PO TABS
100.0000 mg | ORAL_TABLET | Freq: Two times a day (BID) | ORAL | Status: DC
  Administered 2019-02-06 – 2019-02-07 (×2): 100 mg via ORAL
  Filled 2019-02-06 (×2): qty 1

## 2019-02-06 MED ORDER — ACETAMINOPHEN 650 MG RE SUPP: 650.0000 mg | Freq: Four times a day (QID) | RECTAL | Status: DC | PRN

## 2019-02-06 MED ORDER — POLYETHYLENE GLYCOL 3350 17 G PO PACK: 17.0000 g | PACK | Freq: Every day | ORAL | Status: DC | PRN

## 2019-02-06 MED ORDER — PANTOPRAZOLE SODIUM 40 MG PO TBEC
40.0000 mg | DELAYED_RELEASE_TABLET | Freq: Every day | ORAL | Status: DC
  Administered 2019-02-07 – 2019-02-20 (×14): 40 mg via ORAL
  Filled 2019-02-06 (×16): qty 1

## 2019-02-06 MED ORDER — CILOSTAZOL 100 MG PO TABS
100.0000 mg | ORAL_TABLET | Freq: Two times a day (BID) | ORAL | Status: DC
  Administered 2019-02-06 – 2019-02-20 (×28): 100 mg via ORAL
  Filled 2019-02-06 (×29): qty 1

## 2019-02-06 MED ORDER — SODIUM CHLORIDE 0.9 % IV BOLUS
500.0000 mL | Freq: Once | INTRAVENOUS | Status: AC
  Administered 2019-02-06: 500 mL via INTRAVENOUS

## 2019-02-06 MED ORDER — ASPIRIN EC 325 MG PO TBEC
325.0000 mg | DELAYED_RELEASE_TABLET | Freq: Every day | ORAL | Status: DC
  Administered 2019-02-07 – 2019-02-20 (×14): 325 mg via ORAL
  Filled 2019-02-06 (×15): qty 1

## 2019-02-06 MED ORDER — INSULIN GLARGINE 100 UNIT/ML ~~LOC~~ SOLN
14.0000 [IU] | Freq: Every day | SUBCUTANEOUS | Status: DC
  Administered 2019-02-07: 14 [IU] via SUBCUTANEOUS
  Filled 2019-02-06 (×2): qty 0.14

## 2019-02-06 MED ORDER — SODIUM CHLORIDE 0.9 % IV SOLN: INTRAVENOUS | Status: AC

## 2019-02-06 MED ORDER — ATORVASTATIN CALCIUM 10 MG PO TABS
20.0000 mg | ORAL_TABLET | Freq: Every day | ORAL | Status: DC
  Filled 2019-02-06: qty 2

## 2019-02-06 MED ORDER — GABAPENTIN 100 MG PO CAPS
100.0000 mg | ORAL_CAPSULE | Freq: Two times a day (BID) | ORAL | Status: DC
  Administered 2019-02-06 – 2019-02-20 (×28): 100 mg via ORAL
  Filled 2019-02-06 (×28): qty 1

## 2019-02-06 MED ORDER — ONDANSETRON HCL 4 MG/2ML IJ SOLN
4.0000 mg | Freq: Four times a day (QID) | INTRAMUSCULAR | Status: DC | PRN
  Administered 2019-02-10: 4 mg via INTRAVENOUS
  Filled 2019-02-06: qty 2

## 2019-02-06 MED ORDER — AMLODIPINE BESYLATE 5 MG PO TABS
5.0000 mg | ORAL_TABLET | Freq: Every day | ORAL | Status: DC
  Administered 2019-02-07: 5 mg via ORAL
  Filled 2019-02-06 (×2): qty 1

## 2019-02-06 MED ORDER — INSULIN ASPART 100 UNIT/ML ~~LOC~~ SOLN: 0.0000 [IU] | Freq: Three times a day (TID) | SUBCUTANEOUS | Status: DC

## 2019-02-06 NOTE — ED Provider Notes (Signed)
[facility] EMERGENCY DEPARTMENT Provider Note   CSN: [id number] Arrival date & time: 02/06/19  1248     History Chief Complaint  Patient presents with  . Loss of Consciousness  . Dizziness    Julie Brewer is a 69 y.o. female.  Patient brought in by EMS.  From home.  Patient was sitting at table eating lunch when family member noted patient was unresponsive.  It was reported that she was out approximately for 10 minutes.  Came patient came to and was alert.  EMS said patient was amatory at the scene.  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  Blood sugar was 157.  Vital signs were stable.  She had a little bit of swelling to her right upper eyelid.  No history of any fall or injury.  Patient upon arrival other than the visual problem is completely alert talkative denies any pain or complaints.  Moving all extremities.  No obvious focal neuro deficit.  Patient has a history of syncope in the past followed by cardiology Donnella Bi does have a loop recorder.  Last episode was in 2019.        Past Medical History:  Diagnosis Date  . Asthma    No probnlems recently  . 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 Diabetes mellitus    Type II per Dr [name]  (patient said type I)  . Fibroid   . GERD (gastroesophageal reflux disease)   . Glaucoma   . Hyperlipidemia   . Hypertension   . Iron deficiency anemia 03/09/2016  . Peripheral vascular disease ([place])   . Pneumonia 2006  . Shortness of breath dyspnea    with exdrtion, "Walkling too fast"  . Stroke [facility])    no residual    Patient Active Problem List   Diagnosis Date Noted  . AKI (acute kidney injury) ([place]) 06/20/2018  . Acute exacerbation of CHF (congestive heart failure) ([place]) 06/20/2018  . Acute respiratory failure with hypoxia ([place]) 06/20/2018  . Tobacco abuse 06/20/2018  . GERD (gastroesophageal reflux disease) 06/08/2017  . Guaiac positive  stools 06/08/2017  . CRI (chronic renal insufficiency), stage 3 (moderate) ([place]) 05/01/2016  . History of TIA (transient ischemic attack) 05/01/2016  . Blind 05/01/2016  . Iron deficiency anemia 03/09/2016  . Syncope and collapse 01/16/2016  . Dysphagia, pharyngoesophageal phase 05/05/2014  . Insulin dependent diabetes mellitus with complications ([place]) 40/98/1191  . Essential hypertension 10/06/2012  . Dyslipidemia 09/22/2012  . Peripheral vascular disease ([place]) 09/01/2012    Past Surgical History:  Procedure Laterality Date  . ABDOMINAL HYSTERECTOMY    . BIOPSY  06/10/2017   Procedure: BIOPSY;  Surgeon: Ronnette Juniper, MD;  Location: [facility];  Service: Gastroenterology;;  . CERVICAL FUSION     with graft from hip  . COLONOSCOPY  July 09, 2012  . DIRECT LARYNGOSCOPY N/A 06/07/2014   Procedure: DIRECT LARYNGOSCOPY with BIOPSY and EXCISION VOLLECULAR CYST;  Surgeon: Ruby Cola, MD;  Location: [facility] OR;  Service: ENT;  Laterality: N/A;  . ESOPHAGOGASTRODUODENOSCOPY (EGD) WITH PROPOFOL Left 06/10/2017   Procedure: ESOPHAGOGASTRODUODENOSCOPY (EGD) WITH PROPOFOL;  Surgeon: Ronnette Juniper, MD;  Location: [facility];  Service: Gastroenterology;  Laterality: Left;  . FLEXIBLE SIGMOIDOSCOPY Left 06/10/2017   Procedure: FLEXIBLE SIGMOIDOSCOPY;  Surgeon: Ronnette Juniper, MD;  Location: [facility];  Service: Gastroenterology;  Laterality: Left;  . LOOP RECORDER INSERTION N/A 06/20/2016   Procedure: Loop Recorder  Insertion;  Surgeon: [name], MD;  Location: [facility] LAB;  Service: Cardiovascular;  Laterality: N/A;  . REFRACTIVE SURGERY Bilateral    both eyes  . SPINE SURGERY     lumbar     OB History    Gravida  1   Para  1   Term  0   Preterm  0   AB  0   Living  1     SAB  0   TAB  0   Ectopic  0   Multiple  0   Live Births  0           Family History  Problem Relation Age of Onset  . Cancer Mother   . Heart disease Mother   . Diabetes Father   . Cancer  Brother   . Cancer Brother   . Throat cancer Brother     Social History   Tobacco Use  . Smoking status: Current Every Day Smoker    Packs/day: 1.00    Years: 30.00    Pack years: 30.00    Types: Cigarettes  . Smokeless tobacco: Never Used  Substance Use Topics  . Alcohol use: Yes    Comment: socially  . Drug use: Not Currently    Types: Methylphenidate    Home Medications Prior to Admission medications   Medication Sig Start Date End Date Taking? Authorizing Provider  amLODipine (NORVASC) 5 MG tablet Take 5 mg by mouth daily.    [provider]  aspirin 325 MG EC tablet Take 325 mg by mouth daily.     [provider]  atorvastatin (LIPITOR) 20 MG tablet Take 1 tablet by mouth once daily 12/01/18   Elayne Snare, MD  cilostazol (PLETAL) 100 MG tablet Take 1 tablet by mouth twice daily 12/01/18   Marty Heck, MD  docusate sodium (COLACE) 100 MG capsule Take 100 mg by mouth 2 (two) times daily.    [provider]  furosemide (LASIX) 40 MG tablet Take 40 mg by mouth daily.    [provider]  gabapentin (NEURONTIN) 100 MG capsule Take 1 capsule by mouth twice daily 12/23/18   Elayne Snare, MD  glucose blood (FREESTYLE LITE) test strip USE AS INSTRUCTED TO CHECK BLOOD SUGAR ONCE DAILY; E11.8 09/10/18   Elayne Snare, MD  Insulin Glargine (BASAGLAR KWIKPEN) 100 UNIT/ML SOPN Inject 0.14 mLs (14 Units total) into the skin daily. Inject 14 units under the skin once daily. *Replaces Lantus* 12/17/18   Elayne Snare, MD  insulin regular (NOVOLIN R RELION) 100 units/mL injection Inject into the skin 2 (two) times daily before a meal. INJECT 10 UNITS UNDER THE SKIN AT BREAKFAST, AND 5-7 UNITS IN THE EVENING.    [provider]  IRON PO Take 1 tablet by mouth 2 (two) times a day.     [provider]  labetalol (NORMODYNE) 100 MG tablet Take 1 tablet by mouth twice daily 10/02/18   Elayne Snare, MD  losartan (COZAAR) 25 MG tablet Take 1 tablet  by mouth once daily. 07/18/18   Elayne Snare, MD  omeprazole (PRILOSEC) 20 MG capsule TAKE 1 CAPSULE BY MOUTH ONCE DAILY ( DUE FOR OFFICE VISIT THIS MONTH) 12/02/18   Elayne Snare, MD  pantoprazole (PROTONIX) 40 MG tablet Take 1 tablet (40 mg total) by mouth daily for 30 days. 06/26/18 12/31/18  Amin, Jeanella Flattery, MD  polyethylene glycol (MIRALAX / GLYCOLAX) 17 g packet Take 17 g by mouth daily as  needed for moderate constipation. 06/25/18   Amin, Jeanella Flattery, MD  Vitamin D, Ergocalciferol, (DRISDOL) 1.25 MG (50000 UT) CAPS capsule TAKE ONE CAPSULE BY MOUTH EVERY 7 DAYS Patient taking differently: Take 50,000 Units by mouth every 30 (thirty) days. Pt takes on 1st day of month 01/30/18   Elayne Snare, MD    Allergies    Morphine and related and Penicillins  Review of Systems   Review of Systems  Constitutional: Negative for chills and fever.  HENT: Negative for congestion, rhinorrhea and sore throat.   Eyes: Positive for visual disturbance.  Respiratory: Negative for cough and shortness of breath.   Cardiovascular: Negative for chest pain and leg swelling.  Gastrointestinal: Negative for abdominal pain, diarrhea, nausea and vomiting.  Genitourinary: Negative for dysuria.  Musculoskeletal: Negative for back pain and neck pain.  Skin: Negative for rash.  Neurological: Positive for syncope. Negative for dizziness, light-headedness and headaches.  Hematological: Does not bruise/bleed easily.  Psychiatric/Behavioral: Negative for confusion.    Physical Exam Updated Vital Signs BP 132/66 (BP Location: Right Arm)   Pulse 72   Temp 97.9 F (36.6 C) (Oral)   Resp 13   Ht 1.6 m (5\' 3" )   Wt 63.5 kg   SpO2 98%   BMI 24.80 kg/m   Physical Exam Vitals and nursing note reviewed.  Constitutional:      General: She is not in acute distress.    Appearance: Normal appearance. She is well-developed.  HENT:     Head: Normocephalic and atraumatic.  Eyes:     Conjunctiva/sclera: Conjunctivae normal.      Comments: Some swelling to the lateral aspect of the right upper eyelid.  No swelling below no obvious bruising.  No evidence of any eye trauma.  Cardiovascular:     Rate and Rhythm: Normal rate and regular rhythm.     Heart sounds: No murmur.  Pulmonary:     Effort: Pulmonary effort is normal. No respiratory distress.     Breath sounds: Normal breath sounds.  Abdominal:     Palpations: Abdomen is soft.     Tenderness: There is no abdominal tenderness.  Musculoskeletal:        General: Normal range of motion.     Cervical back: Normal range of motion and neck supple.  Skin:    General: Skin is warm and dry.     Capillary Refill: Capillary refill takes less than 2 seconds.  Neurological:     General: No focal deficit present.     Mental Status: She is alert and oriented to person, place, and time.     Cranial Nerves: Cranial nerve deficit present.     Sensory: No sensory deficit.     Motor: No weakness.     Comments: Blind.     ED Results / Procedures / Treatments   Labs (all labs ordered are listed, but only abnormal results are displayed) Labs Reviewed  COMPREHENSIVE METABOLIC PANEL - Abnormal; Notable for the following components:      Result Value   Chloride 113 (*)    CO2 18 (*)    Glucose, Bld 139 (*)    BUN 56 (*)    Creatinine, Ser 4.34 (*)    Calcium 8.4 (*)    Total Protein 5.9 (*)    Albumin 3.0 (*)    Total Bilirubin 0.2 (*)    GFR calc non Af Amer 10 (*)    GFR calc Af Amer 12 (*)    All  other components within normal limits  CBC WITH DIFFERENTIAL/PLATELET - Abnormal; Notable for the following components:   RBC 3.56 (*)    Hemoglobin 9.2 (*)    HCT 30.1 (*)    MCH 25.8 (*)    RDW 16.2 (*)    Lymphs Abs 0.4 (*)    Eosinophils Absolute 0.8 (*)    All other components within normal limits  SARS CORONAVIRUS 2 (TAT 6-24 HRS)    EKG EKG Interpretation  Date/Time:  Friday February 06 2019 12:52:24 EST Ventricular Rate:  76 PR Interval:    QRS  Duration: 76 QT Interval:  404 QTC Calculation: 455 R Axis:   -4 Text Interpretation: Sinus rhythm Anteroseptal infarct, old Nonspecific T abnormalities, lateral leads Confirmed by Fredia Sorrow [phone]) on 02/06/2019 2:23:36 PM   Radiology DG Chest Port 1 View  Result Date: 02/06/2019 CLINICAL DATA:  Syncopal episode today. EXAM: PORTABLE CHEST 1 VIEW COMPARISON:  06/20/2018 FINDINGS: The heart size and mediastinal contours are within normal limits. Cardiac loop recorder seen overlying the heart. Aortic atherosclerosis incidentally noted. Pulmonary interstitial prominence again noted. Pleural effusions have resolved since prior study. Airspace opacity is seen in the retrocardiac left lung base, which is suspicious for pneumonia. IMPRESSION: Retrocardiac left lower lobe airspace disease, suspicious for pneumonia. If clinical signs/symptoms are consistent with pneumonia, recommend chest radiographic follow-up to confirm resolution. Alternatively, chest CT could be performed for further evaluation. Electronically Signed   By: Marlaine Hind M.D.   On: 02/06/2019 13:25    Procedures Procedures (including critical care time)  Medications Ordered in ED Medications - No data to display  ED Course  I have reviewed the triage vital signs and the nursing notes.  Pertinent labs & imaging results that were available during my care of the patient were reviewed by me and considered in my medical decision making (see chart for details).    MDM Rules/Calculators/A&P                     For the syncope.  It was reported that it was 10 minutes but patient here is completely asymptomatic.  She states syncope before.  Patient without any neuro deficit other than her pre-existing blindness.  Not able to completely explain the swelling to the right upper outer eyelid.  No tenderness so not a stye.  Could be some mild trauma.  But patient has a loop recorder asked for that to be interrogated.  But she has had  syncope many times before probably would not require admission for that alone.  But renal function showed a significant change in her BUN and creatinine.  Consistent with acute kidney injury.  Patient will require admission for that discussed with hospitalist.  They will admit.  Also ordered Covid testing and orthostatic vital signs.  Final Clinical Impression(s) / ED Diagnoses Final diagnoses:  Syncope, unspecified syncope type  AKI (acute kidney injury) [facility])    Rx / DC Orders ED Discharge Orders    None       Fredia Sorrow, MD 02/06/19 1702

## 2019-02-06 NOTE — ED Notes (Signed)
Call daughter Nysha Koplin at [phone]

## 2019-02-06 NOTE — ED Notes (Signed)
Pt to CT

## 2019-02-06 NOTE — ED Triage Notes (Signed)
Pt arrives via EMS from home. Was sitting at the table eating lunch when family member noted patient was unresponsive with tongue hanging out of mouth for approx 10 min. Came to and now alert, oriented x4. Also woke this morning with right eye swelling. No known injury. Denies recent fever. Ambulatory on scene. Blind in both eyes. Vss. cbg 157.

## 2019-02-06 NOTE — H&P (Signed)
History and Physical    Julie Brewer:[id number] DOB: 07/19/1950 DOA: 02/06/2019  PCP: [name], MD  Patient coming from: Home Have personally reviewed patient's old medical records in [place].  Chief Complaint: I passed out  HPI: Julie Brewer is a 69 y.o. female with medical history significant for hypertension, chronic kidney disease stage IV, type 2 diabetes and peripheral vascular disease.  She has been at her baseline until she passed out today.  She does not remember exactly what happened but recollects standing up to walk to her dining table only to pass out at the table.  She felt woozy while she was walking.  Unfortunately, at the time of documentation I do not have an eyewitness(her niece) account to give additional history.  She denies chest pain, headache, shortness of breath, tongue biting, diarrhea, vomiting or fever.  Patient has history of previous syncopal event  ED Course: At the ED, patient was hemodynamically stable and afebrile.  His EKG was devoid of any significant arrhythmia  Review of Systems: As per HPI otherwise 10 point review of systems negative.   Past Medical History:  Diagnosis Date  . Asthma    No probnlems recently  . 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 Diabetes mellitus    Type II per Dr [name]  (patient said type I)  . Fibroid   . GERD (gastroesophageal reflux disease)   . Glaucoma   . Hyperlipidemia   . Hypertension   . Iron deficiency anemia 03/09/2016  . Peripheral vascular disease ([place])   . Pneumonia 2006  . Shortness of breath dyspnea    with exdrtion, "Walkling too fast"  . Stroke [facility])    no residual    Past Surgical History:  Procedure Laterality Date  . ABDOMINAL HYSTERECTOMY    . BIOPSY  06/10/2017   Procedure: BIOPSY;  Surgeon: Ronnette Juniper, MD;  Location: [place];  Service: Gastroenterology;;  . CERVICAL FUSION     with graft from hip  .  COLONOSCOPY  July 09, 2012  . DIRECT LARYNGOSCOPY N/A 06/07/2014   Procedure: DIRECT LARYNGOSCOPY with BIOPSY and EXCISION VOLLECULAR CYST;  Surgeon: Ruby Cola, MD;  Location: [facility] OR;  Service: ENT;  Laterality: N/A;  . ESOPHAGOGASTRODUODENOSCOPY (EGD) WITH PROPOFOL Left 06/10/2017   Procedure: ESOPHAGOGASTRODUODENOSCOPY (EGD) WITH PROPOFOL;  Surgeon: Ronnette Juniper, MD;  Location: [place];  Service: Gastroenterology;  Laterality: Left;  . FLEXIBLE SIGMOIDOSCOPY Left 06/10/2017   Procedure: FLEXIBLE SIGMOIDOSCOPY;  Surgeon: Ronnette Juniper, MD;  Location: [place];  Service: Gastroenterology;  Laterality: Left;  . LOOP RECORDER INSERTION N/A 06/20/2016   Procedure: Loop Recorder Insertion;  Surgeon: [name], MD;  Location: [place] [facility];  Service: Cardiovascular;  Laterality: N/A;  . REFRACTIVE SURGERY Bilateral    both eyes  . SPINE SURGERY     lumbar     reports that she has been smoking cigarettes. She has a 30.00 pack-year smoking history. She has never used smokeless tobacco. She reports current alcohol use. She reports previous drug use. Drug: Methylphenidate.  Allergies  Allergen Reactions  . Morphine And Related Other (See Comments)    Hallucinations   . Penicillins Swelling and Rash    Throat swelling Did it involve swelling of the face/tongue/throat, SOB, or low BP? Yes Did it involve sudden or severe rash/hives, skin peeling, or any reaction on the inside of your mouth or nose? Yes Did  you need to seek medical attention at a hospital or doctor's office? Yes When did it last happen?young child If all above answers are "NO", may proceed with cephalosporin use.    Family History  Problem Relation Age of Onset  . Cancer Mother   . Heart disease Mother   . Diabetes Father   . Cancer Brother   . Cancer Brother   . Throat cancer Brother    Maternal history of heart disease.   Prior to Admission medications   Medication Sig Start Date End Date Taking?  Authorizing Provider  amLODipine (NORVASC) 5 MG tablet Take 5 mg by mouth daily.   Yes [provider]  aspirin 325 MG EC tablet Take 325 mg by mouth daily.    Yes [provider]  atorvastatin (LIPITOR) 20 MG tablet Take 1 tablet by mouth once daily Patient taking differently: Take 20 mg by mouth daily.  12/01/18  Yes Elayne Snare, MD  bisacodyl (DULCOLAX) 5 MG EC tablet Take 5 mg by mouth daily as needed for moderate constipation.   Yes [provider]  cilostazol (PLETAL) 100 MG tablet Take 1 tablet by mouth twice daily Patient taking differently: Take 100 mg by mouth 2 (two) times daily.  12/01/18  Yes Marty Heck, MD  docusate sodium (COLACE) 100 MG capsule Take 200 mg by mouth daily.    Yes [provider]  ferrous sulfate 325 (65 FE) MG tablet Take 650 mg by mouth daily.   Yes [provider]  furosemide (LASIX) 40 MG tablet Take 40 mg by mouth daily.   Yes [provider]  gabapentin (NEURONTIN) 100 MG capsule Take 1 capsule by mouth twice daily Patient taking differently: Take 100 mg by mouth 2 (two) times daily.  12/23/18  Yes Elayne Snare, MD  insulin glargine (LANTUS) 100 UNIT/ML injection Inject 14 Units into the skin daily.   Yes [provider]  insulin regular (NOVOLIN R RELION) 100 units/mL injection Inject 5-12 Units into the skin See admin instructions. Inject 12 units subcutaneously every morning and 5-7 units in the evening (5 units for regular meal, 7 units if eating potatoes)   Yes [provider]  labetalol (NORMODYNE) 100 MG tablet Take 1 tablet by mouth twice daily Patient taking differently: Take 100 mg by mouth 2 (two) times daily.  10/02/18  Yes Elayne Snare, MD  omeprazole (PRILOSEC) 20 MG capsule TAKE 1 CAPSULE BY MOUTH ONCE DAILY ( DUE FOR OFFICE VISIT THIS MONTH) Patient taking differently: Take 20 mg by mouth daily. Due for office visit this month 12/02/18  Yes Elayne Snare, MD  Vitamin D,  Ergocalciferol, (DRISDOL) 1.25 MG (50000 UT) CAPS capsule TAKE ONE CAPSULE BY MOUTH EVERY 7 DAYS Patient taking differently: Take 50,000 Units by mouth every 30 (thirty) days. On or about the 1st of each month 01/30/18  Yes Elayne Snare, MD  glucose blood (FREESTYLE LITE) test strip USE AS INSTRUCTED TO CHECK BLOOD SUGAR ONCE DAILY; E11.8 09/10/18   Elayne Snare, MD  pantoprazole (PROTONIX) 40 MG tablet Take 1 tablet (40 mg total) by mouth daily for 30 days. 06/26/18 12/31/18  Damita Lack, MD    Physical Exam: Vitals:   02/06/19 1251 02/06/19 1252  BP:  132/66  Pulse:  72  Resp:  13  Temp:  97.9 F (36.6 C)  TempSrc:  Oral  SpO2:  98%  Weight: 63.5 kg   Height: 5\' 3"  (1.6 m)     Constitutional: NAD,  calm, comfortable Vitals:   02/06/19 1251 02/06/19 1252  BP:  132/66  Pulse:  72  Resp:  13  Temp:  97.9 F (36.6 C)  TempSrc:  Oral  SpO2:  98%  Weight: 63.5 kg   Height: 5\' 3"  (1.6 m)    Eyes: PERRL, lids and conjunctivae normal ENMT: Mucous membranes are moist. Posterior pharynx clear of any exudate or lesions.Normal dentition.  Neck: normal, supple, no masses, no thyromegaly Respiratory: clear to auscultation bilaterally, no wheezing, no crackles. Normal respiratory effort. No accessory muscle use.  Cardiovascular: Regular rate and rhythm, no murmurs / rubs / gallops. No extremity edema. 2+ pedal pulses. No carotid bruits.  Abdomen: no tenderness, no masses palpated. No hepatosplenomegaly. Bowel sounds positive.  Musculoskeletal: no clubbing / cyanosis. No joint deformity upper and lower extremities. Good ROM, no contractures. Normal muscle tone.  Skin: no rashes, lesions, ulcers. No induration Neurologic: CN 2-12 grossly intact except for 4,5,6 was not assessed. Sensation intact, DTR normal. Strength 5/5 in all 4.  Psychiatric: Normal judgment and insight. Alert and oriented x 3. Normal mood.    Labs on Admission: I have personally reviewed following labs and imaging  studies  CBC: Recent Labs  Lab 02/06/19 1335  WBC 6.9  NEUTROABS 5.0  HGB 9.2*  HCT 30.1*  MCV 84.6  PLT 654   Basic Metabolic Panel: Recent Labs  Lab 02/06/19 1335  NA 141  K 4.9  CL 113*  CO2 18*  GLUCOSE 139*  BUN 56*  CREATININE 4.34*  CALCIUM 8.4*   GFR: Estimated Creatinine Clearance: 11.4 mL/min (A) (by C-G formula based on SCr of 4.34 mg/dL (H)). Liver Function Tests: Recent Labs  Lab 02/06/19 1335  AST 15  ALT 19  ALKPHOS 88  BILITOT 0.2*  PROT 5.9*  ALBUMIN 3.0*   No results for input(s): LIPASE, AMYLASE in the last 168 hours. No results for input(s): AMMONIA in the last 168 hours. Coagulation Profile: No results for input(s): INR, PROTIME in the last 168 hours. Cardiac Enzymes: No results for input(s): CKTOTAL, CKMB, CKMBINDEX, TROPONINI in the last 168 hours. BNP (last 3 results) No results for input(s): PROBNP in the last 8760 hours. HbA1C: No results for input(s): HGBA1C in the last 72 hours. CBG: No results for input(s): GLUCAP in the last 168 hours. Lipid Profile: No results for input(s): CHOL, HDL, LDLCALC, TRIG, CHOLHDL, LDLDIRECT in the last 72 hours. Thyroid Function Tests: No results for input(s): TSH, T4TOTAL, FREET4, T3FREE, THYROIDAB in the last 72 hours. Anemia Panel: No results for input(s): VITAMINB12, FOLATE, FERRITIN, TIBC, IRON, RETICCTPCT in the last 72 hours. Urine analysis:    Component Value Date/Time   COLORURINE STRAW (A) 06/20/2018 2127   APPEARANCEUR CLEAR 06/20/2018 2127   LABSPEC 1.008 06/20/2018 2127   LABSPEC 1.010 03/09/2016 1400   PHURINE 6.0 06/20/2018 2127   GLUCOSEU NEGATIVE 06/20/2018 2127   GLUCOSEU NEGATIVE 03/11/2018 0958   GLUCOSEU Negative 03/09/2016 1400   HGBUR NEGATIVE 06/20/2018 2127   BILIRUBINUR NEGATIVE 06/20/2018 2127   BILIRUBINUR Negative 03/09/2016 1400   KETONESUR NEGATIVE 06/20/2018 2127   PROTEINUR 100 (A) 06/20/2018 2127   UROBILINOGEN 0.2 03/11/2018 0958   UROBILINOGEN 0.2  03/09/2016 1400   NITRITE NEGATIVE 06/20/2018 2127   LEUKOCYTESUR NEGATIVE 06/20/2018 2127   LEUKOCYTESUR Negative 03/09/2016 1400    Radiological Exams on Admission: CT Chest Wo Contrast  Result Date: 02/06/2019 CLINICAL DATA:  Altered mental status. EXAM: CT CHEST WITHOUT CONTRAST TECHNIQUE: Multidetector CT imaging of the  chest was performed following the standard protocol without IV contrast. COMPARISON:  None. FINDINGS: Cardiovascular: There is moderate severity calcification of the thoracic aorta. Normal heart size. No pericardial effusion. Marked severity coronary artery calcification is seen. Mediastinum/Nodes: Lungs/Pleura: There is mild emphysematous lung disease. Mild atelectasis is seen along the posterior aspects of the bilateral lung bases. There is no evidence of a pleural effusion or pneumothorax. Upper Abdomen: Tiny gallstones are seen within the gallbladder lumen. Musculoskeletal: No chest wall mass or suspicious bone lesions identified. IMPRESSION: 1. Mild bibasilar atelectasis. 2. Mild emphysematous lung disease. Emphysema (ICD10-J43.9). Electronically Signed   By: Virgina Norfolk M.D.   On: 02/06/2019 17:06   DG Chest Port 1 View  Result Date: 02/06/2019 CLINICAL DATA:  Syncopal episode today. EXAM: PORTABLE CHEST 1 VIEW COMPARISON:  06/20/2018 FINDINGS: The heart size and mediastinal contours are within normal limits. Cardiac loop recorder seen overlying the heart. Aortic atherosclerosis incidentally noted. Pulmonary interstitial prominence again noted. Pleural effusions have resolved since prior study. Airspace opacity is seen in the retrocardiac left lung base, which is suspicious for pneumonia. IMPRESSION: Retrocardiac left lower lobe airspace disease, suspicious for pneumonia. If clinical signs/symptoms are consistent with pneumonia, recommend chest radiographic follow-up to confirm resolution. Alternatively, chest CT could be performed for further evaluation. Electronically  Signed   By: Marlaine Hind M.D.   On: 02/06/2019 13:25    EKG: Independently reviewed.  Normal sinus rhythm with no acute ischemic changes  Assessment/Plan Active Problems:   Syncope   1.  Syncope: Etiology is not clear but patient has a history of recurrent syncope.  Loop recorder interrogation to be done.  2.  Acute kidney injury on CKD stage IV: Avoid nephrotoxic agents including holding diuretics.  Patient will be cautiously given IV fluids.  If renal function will improve, we will consider nephrology consult.  3.  Type 2 diabetes: Continue insulin sliding scale coverage and long-acting insulin.  4.  Hyperlipidemia: Continue statin  5.  Hypertension: Monitor blood pressure profile.  Continue amlodipine and labetalol.  6.  Iron deficiency anemia: We will continue iron supplementation.  7.  Emphysema: Patient has history of chronic smoking.  Currently stable with no evidence of exacerbation.  Continue as needed bronchodilators  DVT prophylaxis: Heparin  Code Status: DNR Family Communication: Discussed with patient's daughter Jeanella Flattery  Disposition Plan: Anticipated discharge in the next 24 to 48 hours Consults called: We will consider nephrology consult if renal function does not improve tomorrow Admission status: Observation  Phineas Semen MD [facility] Pager [phone]  If 7PM-7AM, please contact night-coverage [email or web address] Password [facility]  02/06/2019, 5:39 PM

## 2019-02-07 ENCOUNTER — Other Ambulatory Visit: Payer: Self-pay

## 2019-02-07 DIAGNOSIS — E1169 Type 2 diabetes mellitus with other specified complication: Secondary | ICD-10-CM

## 2019-02-07 DIAGNOSIS — E1151 Type 2 diabetes mellitus with diabetic peripheral angiopathy without gangrene: Secondary | ICD-10-CM | POA: Diagnosis present

## 2019-02-07 DIAGNOSIS — Z880 Allergy status to penicillin: Secondary | ICD-10-CM | POA: Diagnosis not present

## 2019-02-07 DIAGNOSIS — R6510 Systemic inflammatory response syndrome (SIRS) of non-infectious origin without acute organ dysfunction: Secondary | ICD-10-CM | POA: Diagnosis present

## 2019-02-07 DIAGNOSIS — D649 Anemia, unspecified: Secondary | ICD-10-CM | POA: Diagnosis not present

## 2019-02-07 DIAGNOSIS — E11649 Type 2 diabetes mellitus with hypoglycemia without coma: Secondary | ICD-10-CM | POA: Diagnosis present

## 2019-02-07 DIAGNOSIS — N189 Chronic kidney disease, unspecified: Secondary | ICD-10-CM | POA: Diagnosis not present

## 2019-02-07 DIAGNOSIS — N179 Acute kidney failure, unspecified: Secondary | ICD-10-CM | POA: Diagnosis present

## 2019-02-07 DIAGNOSIS — J439 Emphysema, unspecified: Secondary | ICD-10-CM | POA: Diagnosis present

## 2019-02-07 DIAGNOSIS — E872 Acidosis: Secondary | ICD-10-CM | POA: Diagnosis present

## 2019-02-07 DIAGNOSIS — E875 Hyperkalemia: Secondary | ICD-10-CM | POA: Diagnosis not present

## 2019-02-07 DIAGNOSIS — F17210 Nicotine dependence, cigarettes, uncomplicated: Secondary | ICD-10-CM | POA: Diagnosis present

## 2019-02-07 DIAGNOSIS — I10 Essential (primary) hypertension: Secondary | ICD-10-CM | POA: Diagnosis not present

## 2019-02-07 DIAGNOSIS — D631 Anemia in chronic kidney disease: Secondary | ICD-10-CM | POA: Diagnosis not present

## 2019-02-07 DIAGNOSIS — E871 Hypo-osmolality and hyponatremia: Secondary | ICD-10-CM | POA: Diagnosis not present

## 2019-02-07 DIAGNOSIS — Z66 Do not resuscitate: Secondary | ICD-10-CM | POA: Diagnosis present

## 2019-02-07 DIAGNOSIS — R945 Abnormal results of liver function studies: Secondary | ICD-10-CM | POA: Diagnosis not present

## 2019-02-07 DIAGNOSIS — N184 Chronic kidney disease, stage 4 (severe): Secondary | ICD-10-CM | POA: Diagnosis present

## 2019-02-07 DIAGNOSIS — J9601 Acute respiratory failure with hypoxia: Secondary | ICD-10-CM | POA: Diagnosis not present

## 2019-02-07 DIAGNOSIS — N183 Chronic kidney disease, stage 3 unspecified: Secondary | ICD-10-CM | POA: Diagnosis not present

## 2019-02-07 DIAGNOSIS — Z8673 Personal history of transient ischemic attack (TIA), and cerebral infarction without residual deficits: Secondary | ICD-10-CM | POA: Diagnosis not present

## 2019-02-07 DIAGNOSIS — J1282 Pneumonia due to coronavirus disease 2019: Secondary | ICD-10-CM | POA: Diagnosis present

## 2019-02-07 DIAGNOSIS — Z794 Long term (current) use of insulin: Secondary | ICD-10-CM

## 2019-02-07 DIAGNOSIS — Z885 Allergy status to narcotic agent status: Secondary | ICD-10-CM | POA: Diagnosis not present

## 2019-02-07 DIAGNOSIS — E8770 Fluid overload, unspecified: Secondary | ICD-10-CM | POA: Diagnosis not present

## 2019-02-07 DIAGNOSIS — E1165 Type 2 diabetes mellitus with hyperglycemia: Secondary | ICD-10-CM | POA: Diagnosis not present

## 2019-02-07 DIAGNOSIS — D509 Iron deficiency anemia, unspecified: Secondary | ICD-10-CM | POA: Diagnosis present

## 2019-02-07 DIAGNOSIS — R0602 Shortness of breath: Secondary | ICD-10-CM | POA: Diagnosis not present

## 2019-02-07 DIAGNOSIS — H02843 Edema of right eye, unspecified eyelid: Secondary | ICD-10-CM | POA: Diagnosis present

## 2019-02-07 DIAGNOSIS — J069 Acute upper respiratory infection, unspecified: Secondary | ICD-10-CM | POA: Diagnosis not present

## 2019-02-07 DIAGNOSIS — E785 Hyperlipidemia, unspecified: Secondary | ICD-10-CM | POA: Diagnosis present

## 2019-02-07 DIAGNOSIS — R55 Syncope and collapse: Secondary | ICD-10-CM | POA: Diagnosis present

## 2019-02-07 DIAGNOSIS — K219 Gastro-esophageal reflux disease without esophagitis: Secondary | ICD-10-CM | POA: Diagnosis present

## 2019-02-07 DIAGNOSIS — U071 COVID-19: Secondary | ICD-10-CM

## 2019-02-07 DIAGNOSIS — R7989 Other specified abnormal findings of blood chemistry: Secondary | ICD-10-CM | POA: Diagnosis not present

## 2019-02-07 DIAGNOSIS — E119 Type 2 diabetes mellitus without complications: Secondary | ICD-10-CM | POA: Diagnosis not present

## 2019-02-07 DIAGNOSIS — I129 Hypertensive chronic kidney disease with stage 1 through stage 4 chronic kidney disease, or unspecified chronic kidney disease: Secondary | ICD-10-CM | POA: Diagnosis not present

## 2019-02-07 LAB — BASIC METABOLIC PANEL
Anion gap: 11 (ref 5–15)
BUN: 49 mg/dL — ABNORMAL HIGH (ref 8–23)
CO2: 16 mmol/L — ABNORMAL LOW (ref 22–32)
Calcium: 8.1 mg/dL — ABNORMAL LOW (ref 8.9–10.3)
Chloride: 113 mmol/L — ABNORMAL HIGH (ref 98–111)
Creatinine, Ser: 3.71 mg/dL — ABNORMAL HIGH (ref 0.44–1.00)
GFR calc Af Amer: 14 mL/min — ABNORMAL LOW (ref >60)
GFR calc non Af Amer: 12 mL/min — ABNORMAL LOW (ref >60)
Glucose, Bld: 86 mg/dL (ref 70–99)
Potassium: 4.5 mmol/L (ref 3.5–5.1)
Sodium: 140 mmol/L (ref 135–145)

## 2019-02-07 LAB — CBC
HCT: 29.7 % — ABNORMAL LOW (ref 36.0–46.0)
Hemoglobin: 9.2 g/dL — ABNORMAL LOW (ref 12.0–15.0)
MCH: 25.6 pg — ABNORMAL LOW (ref 26.0–34.0)
MCHC: 31 g/dL (ref 30.0–36.0)
MCV: 82.5 fL (ref 80.0–100.0)
Platelets: 228 10*3/uL (ref 150–400)
RBC: 3.6 MIL/uL — ABNORMAL LOW (ref 3.87–5.11)
RDW: 16.1 % — ABNORMAL HIGH (ref 11.5–15.5)
WBC: 6.5 10*3/uL (ref 4.0–10.5)
nRBC: 0 % (ref 0.0–0.2)

## 2019-02-07 LAB — GLUCOSE, CAPILLARY
Glucose-Capillary: 77 mg/dL (ref 70–99)
Glucose-Capillary: 83 mg/dL (ref 70–99)
Glucose-Capillary: 116 mg/dL — ABNORMAL HIGH (ref 70–99)
Glucose-Capillary: 100 mg/dL — ABNORMAL HIGH (ref 70–99)
Glucose-Capillary: 130 mg/dL — ABNORMAL HIGH (ref 70–99)
Glucose-Capillary: 125 mg/dL — ABNORMAL HIGH (ref 70–99)

## 2019-02-07 LAB — URINALYSIS, ROUTINE W REFLEX MICROSCOPIC
Bacteria, UA: NONE SEEN
Bilirubin Urine: NEGATIVE
Glucose, UA: NEGATIVE mg/dL
Hgb urine dipstick: NEGATIVE
Ketones, ur: NEGATIVE mg/dL
Leukocytes,Ua: NEGATIVE
Nitrite: NEGATIVE
Protein, ur: 100 mg/dL — AB
Specific Gravity, Urine: 1.012 (ref 1.005–1.030)
pH: 5 (ref 5.0–8.0)

## 2019-02-07 LAB — HEMOGLOBIN A1C
Hgb A1c MFr Bld: 7.1 % — ABNORMAL HIGH (ref 4.8–5.6)
Mean Plasma Glucose: 157.07 mg/dL

## 2019-02-07 LAB — LACTIC ACID, PLASMA
Lactic Acid, Venous: 0.8 mmol/L (ref 0.5–1.9)
Lactic Acid, Venous: 0.7 mmol/L (ref 0.5–1.9)

## 2019-02-07 LAB — MRSA PCR SCREENING: MRSA by PCR: NEGATIVE

## 2019-02-07 LAB — MAGNESIUM: Magnesium: 1.6 mg/dL — ABNORMAL LOW (ref 1.7–2.4)

## 2019-02-07 LAB — PHOSPHORUS: Phosphorus: 4 mg/dL (ref 2.5–4.6)

## 2019-02-07 MED ORDER — HYDRALAZINE HCL 20 MG/ML IJ SOLN: 10.0000 mg | Freq: Three times a day (TID) | INTRAMUSCULAR | Status: DC | PRN

## 2019-02-07 MED ORDER — MAGNESIUM SULFATE 2 GM/50ML IV SOLN
2.0000 g | Freq: Once | INTRAVENOUS | Status: AC
  Administered 2019-02-07: 2 g via INTRAVENOUS
  Filled 2019-02-07: qty 50

## 2019-02-07 MED ORDER — ATORVASTATIN CALCIUM 10 MG PO TABS: 20.0000 mg | ORAL_TABLET | Freq: Every day | ORAL | Status: DC

## 2019-02-07 MED ORDER — EYE WASH OPHTH SOLN: 1.0000 [drp] | Freq: Two times a day (BID) | OPHTHALMIC | Status: DC

## 2019-02-07 MED ORDER — ATORVASTATIN CALCIUM 10 MG PO TABS
20.0000 mg | ORAL_TABLET | Freq: Every day | ORAL | Status: DC
  Administered 2019-02-07 – 2019-02-19 (×13): 20 mg via ORAL
  Filled 2019-02-07 (×13): qty 2

## 2019-02-07 MED ORDER — POLYVINYL ALCOHOL 1.4 % OP SOLN
1.0000 [drp] | OPHTHALMIC | Status: DC | PRN
  Administered 2019-02-07: 1 [drp] via OPHTHALMIC
  Filled 2019-02-07: qty 15

## 2019-02-07 MED ORDER — AMLODIPINE BESYLATE 10 MG PO TABS
10.0000 mg | ORAL_TABLET | Freq: Every day | ORAL | Status: DC
  Administered 2019-02-08 – 2019-02-20 (×13): 10 mg via ORAL
  Filled 2019-02-07 (×13): qty 1

## 2019-02-07 NOTE — Progress Notes (Addendum)
PROGRESS NOTE    Julie Brewer    Code Status: DNR  PTW:[id number] DOB: Jul 11, 1950 DOA: 02/06/2019  PCP: [name], MD    Hospital Summary  This is a 69 year old female with past medical history of hypertension, CKD 4 with proteinuria, type 2 diabetes and follows with endocrinology, tobacco use, hypoglycemic episodes noted in the past, peripheral vascular disease, multiple syncopal episodes in the past with loop recorder placed 06/20/2016 without evidence of arrhythmia and follows with cardiology, who presented to the ED on 1/8 for syncopal episode of unclear etiology.  She was found to be COVID-19 positive on admission.  Also with AKI which had improvement with IV fluid. not started on any COVID-19 treatment as she remained hemodynamically stable on room air.    A & P   Active Problems:   Syncope   1. Syncopal episode with history of recurrent syncope of unknown etiology a. 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 vascular disease (Hx PVD and TIAs) vs. valvular disease (echo in May unremarkable) vs. Orthostatic (orthostatics negative after fluids)  b. Recurrent syncope with unremarkable work-up by cardio in the past, loop recorder placed 06/20/2016 by Dr. Sallyanne Kuster c. Loop recorder interrogated 02/06/2019, unremarkable d. Carotid ultrasound e. Limited echo f. Discontinue sliding scale insulin and monitor blood glucose q4h 2. AKI on CKD 4 a. Follows with nephro outpatient b. Status post renal biopsy 07/30/2018 c. Previously on ACE then ARB for proteinuria but discontinued in the past d. Improved with IV fluids e. Avoid nephrotoxic agents f. Consider nephro consult if renal function worsens 3. Type 2 diabetes a. Follows with endocrinology  outpatient, has been difficult to control outpatient b. Does not check blood sugar regularly at home, has had some concern for hypoglycemic unawareness in the past c. Off Metformin due to poor creatinine clearance d. HA1C 7.1 on 02/07/19 e. Glucose 77 this a.m., asymptomatic f. Discontinue sliding scale g. q4h blood glucose 4. COVID-19 a. Tolerating room air, No indication for treatment 5. Non-anion gap metabolic acidosis, Unknown etiology, possibly from AKI or diarrhea a. If bicarb continues to downtrend will replace 6. Hypomagnesemia a. Replace 7. Hyperlipidemia on statin 8. Hypertension, difficulty to control in the past a. Increase Amlodipine to 10 mg b. Discontinue labetalol for concern of hypoglycemia unawareness c. Start PRN hydralazine 9. PVD a. Continue cilostazol b. Carotid ultrasound 10. 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 continue iron supplement 11. Tobacco use with emphysema a. Smokes half pack per day b. Tobacco cessation provided 12. Nontender Right eye swelling, unknown etiology a. eyedrops  DVT prophylaxis: Heparin Diet: Carb control Family Communication: Patient's daughter and niece Vecchiarelli [phone]) has been updated  Disposition Plan: Admit for further syncopal work-up  Consultants  Discussed with neurology over the phone who does not believe this to be seizure-like in nature but more likely vascular  Procedures  None  Antibiotics   Anti-infectives (From admission, onward)   None           Subjective   Seen and examined at bedside no acute distress resting comfortably.  I had an extensive discussion with both the patient's daughter over the phone and patient's niece who witnessed patient's syncopal episode over the phone on 2 separate occasions.  According to the patient's niece, patient was downstairs eating  breakfast, got up from chair and walked upstairs became very short of breath sat down and had subsequent loss of consciousness for roughly 2 to  3 minutes.  Niece was present for this episode and noticed patient's tongue protruding.  This was different than her prior syncopal episodes where she noted bowel incontinence in the past either preceding or during syncopal episodes.  No history of seizures.  Following her syncopal episode, she had woken up without any change in mentation/postictal episode.  She had a presyncopal episode last Wednesday in which she did lose her bowel function.  Has had recurrent syncopal/presyncopal episodes in the past with extensive work-up by cardiology and has seen endocrine and nephrology as well.  Did not see her fall off chair or hit her eye, unknown etiology of right eye lid swelling  Niece states patient has had a cough over the past week and has been taking Coricidin HBP (Chlorpheniramine-Dextromethorp) over the past week.  Has had increased shortness of breath from baseline  Currently, patient denies any chest pain, shortness of breath, nausea, vomiting, diarrhea, constipation.  Denies palpitations.  Objective   Vitals:   02/07/19 0934 02/07/19 0935 02/07/19 1149 02/07/19 1357  BP: (!) 162/69 (!) 165/65 (!) 146/58 (!) 150/65  Pulse: 89 88  85  Resp: (!) 24 (!) 31 (!) 22 20  Temp:   97.7 F (36.5 C) 98 F (36.7 C)  TempSrc:      SpO2: 97% 92% 97% 95%  Weight:      Height:        Intake/Output Summary (Last 24 hours) at 02/07/2019 1427 Last data filed at 02/07/2019 1300 Gross per 24 hour  Intake 1957.5 ml  Output --  Net 1957.5 ml   Filed Weights   02/06/19 1251 02/07/19 0251  Weight: 63.5 kg 65 kg    Examination:  Physical Exam Vitals and nursing note reviewed.  Constitutional:      General: She is not in acute distress.    Appearance: Normal appearance. She is not toxic-appearing.  HENT:     Head: Normocephalic.  Eyes:     General:        Right eye: No discharge.        Left eye: No discharge.     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     Pulses: Normal pulses.  Pulmonary:     Effort: Pulmonary effort is normal. No respiratory distress.  Abdominal:     General: Abdomen is flat.  Musculoskeletal:        General: No swelling or tenderness.  Neurological:     Mental Status: She is alert. Mental status is at baseline.  Psychiatric:        Mood and Affect: Mood normal.        Behavior: Behavior normal.        Thought Content: Thought content normal.     Data Reviewed: I have personally reviewed following labs and imaging studies  CBC: Recent Labs  Lab 02/06/19 1335 02/07/19 0425  WBC 6.9 6.5  NEUTROABS 5.0  --   HGB 9.2* 9.2*  HCT 30.1* 29.7*  MCV 84.6 82.5  PLT 224 960   Basic Metabolic Panel: Recent Labs  Lab 02/06/19 1335 02/07/19 0425  NA 141 140  K 4.9 4.5  CL 113* 113*  CO2 18*  16*  GLUCOSE 139* 86  BUN 56* 49*  CREATININE 4.34* 3.71*  CALCIUM 8.4* 8.1*  MG  --  1.6*  PHOS  --  4.0   GFR: Estimated Creatinine Clearance: 13.5 mL/min (A) (by C-G formula based on SCr of 3.71 mg/dL (H)). Liver Function Tests: Recent Labs  Lab 02/06/19 1335  AST 15  ALT 19  ALKPHOS 88  BILITOT 0.2*  PROT 5.9*  ALBUMIN 3.0*   No results for input(s): LIPASE, AMYLASE in the last 168 hours. No results for input(s): AMMONIA in the last 168 hours. Coagulation Profile: No results for input(s): INR, PROTIME in the last 168 hours. Cardiac Enzymes: No results for input(s): CKTOTAL, CKMB, CKMBINDEX, TROPONINI in the last 168 hours. BNP (last 3 results) No results for input(s): PROBNP in the last 8760 hours. HbA1C: Recent Labs    02/07/19 0425  HGBA1C 7.1*   CBG: Recent Labs  Lab 02/06/19 2258 02/07/19 0448 02/07/19 0748 02/07/19 1107 02/07/19 1234  GLUCAP 229* 77 83 116* 100*   Lipid Profile: No results for input(s): CHOL, HDL, LDLCALC, TRIG, CHOLHDL, LDLDIRECT in the last 72  hours. Thyroid Function Tests: No results for input(s): TSH, T4TOTAL, FREET4, T3FREE, THYROIDAB in the last 72 hours. Anemia Panel: No results for input(s): VITAMINB12, FOLATE, FERRITIN, TIBC, IRON, RETICCTPCT in the last 72 hours. Sepsis Labs: Recent Labs  Lab 02/07/19 1059  LATICACIDVEN 0.8    Recent Results (from the past 240 hour(s))  SARS CORONAVIRUS 2 (TAT 6-24 HRS) Nasopharyngeal Nasopharyngeal Swab     Status: Abnormal   Collection Time: 02/06/19  3:17 PM   Specimen: Nasopharyngeal Swab  Result Value Ref Range Status   SARS Coronavirus 2 POSITIVE (A) NEGATIVE Final    Comment: RESULT CALLED TO, READ BACK BY AND VERIFIED WITH: J.ROMAS RN 2046 02/06/19 MCCORMICK K (NOTE) SARS-CoV-2 target nucleic acids are DETECTED. The SARS-CoV-2 RNA is generally detectable in upper and lower respiratory specimens during the acute phase of infection. Positive results are indicative of the presence of SARS-CoV-2 RNA. Clinical correlation with patient history and other diagnostic information is  necessary to determine patient infection status. Positive results do not rule out bacterial infection or co-infection with other viruses.  The expected result is Negative. Fact Sheet for Patients: SugarRoll.be Fact Sheet for Healthcare Providers: https://www.woods-mathews.com/ This test is not yet approved or cleared by the [place] FDA and  has been authorized for detection and/or diagnosis of SARS-CoV-2 by FDA under an Emergency Use Authorization (EUA). This EUA will remain  in effect (meaning this test can be used) for th e duration of the COVID-19 declaration under Section 564(b)(1) of the Act, 21 U.S.C. section 360bbb-3(b)(1), unless the authorization is terminated or revoked sooner. Performed at [facility], [place] [address], [place], [place] [address]   MRSA PCR Screening     Status: None   Collection Time: 02/07/19  2:48 AM   Specimen:  Nasal Mucosa; Nasopharyngeal  Result Value Ref Range Status   MRSA by PCR NEGATIVE NEGATIVE Final    Comment:        The GeneXpert MRSA Assay (FDA approved for NASAL specimens only), is one component of a comprehensive MRSA colonization surveillance program. It is not intended to diagnose MRSA infection nor to guide or monitor treatment for MRSA infections. Performed at [facility], [place] [address] [place], [place] [address]          Radiology Studies: CT HEAD WO CONTRAST  Result Date: 02/06/2019 CLINICAL  DATA:  Syncope. Episode of unresponsiveness. No reported injury. EXAM: CT HEAD WITHOUT CONTRAST TECHNIQUE: Contiguous axial images were obtained from the base of the skull through the vertex without intravenous contrast. COMPARISON:  07/17/2009 head CT. FINDINGS: Brain: No evidence of parenchymal hemorrhage or extra-axial fluid collection. No mass lesion, mass effect, or midline shift. No CT evidence of acute infarction. Nonspecific moderate subcortical and periventricular white matter hypodensity, most in keeping with chronic small vessel ischemic change. Cerebral volume is age appropriate. No ventriculomegaly. Vascular: No acute abnormality. Skull: No evidence of calvarial fracture. Sinuses/Orbits: No fluid levels. Mild partial opacification of the right ethmoidal air cells. Other:  The mastoid air cells are unopacified. IMPRESSION: 1.  No evidence of acute intracranial abnormality. 2. Moderate chronic small vessel ischemic changes in the cerebral white matter. Electronically Signed   By: Ilona Sorrel M.D.   On: 02/06/2019 18:28   CT Chest Wo Contrast  Result Date: 02/06/2019 CLINICAL DATA:  Altered mental status. EXAM: CT CHEST WITHOUT CONTRAST TECHNIQUE: Multidetector CT imaging of the chest was performed following the standard protocol without IV contrast. COMPARISON:  None. FINDINGS: Cardiovascular: There is moderate severity calcification of the thoracic aorta. Normal heart  size. No pericardial effusion. Marked severity coronary artery calcification is seen. Mediastinum/Nodes: Lungs/Pleura: There is mild emphysematous lung disease. Mild atelectasis is seen along the posterior aspects of the bilateral lung bases. There is no evidence of a pleural effusion or pneumothorax. Upper Abdomen: Tiny gallstones are seen within the gallbladder lumen. Musculoskeletal: No chest wall mass or suspicious bone lesions identified. IMPRESSION: 1. Mild bibasilar atelectasis. 2. Mild emphysematous lung disease. Emphysema (ICD10-J43.9). Electronically Signed   By: Virgina Norfolk M.D.   On: 02/06/2019 17:06   DG Chest Port 1 View  Result Date: 02/06/2019 CLINICAL DATA:  Syncopal episode today. EXAM: PORTABLE CHEST 1 VIEW COMPARISON:  06/20/2018 FINDINGS: The heart size and mediastinal contours are within normal limits. Cardiac loop recorder seen overlying the heart. Aortic atherosclerosis incidentally noted. Pulmonary interstitial prominence again noted. Pleural effusions have resolved since prior study. Airspace opacity is seen in the retrocardiac left lung base, which is suspicious for pneumonia. IMPRESSION: Retrocardiac left lower lobe airspace disease, suspicious for pneumonia. If clinical signs/symptoms are consistent with pneumonia, recommend chest radiographic follow-up to confirm resolution. Alternatively, chest CT could be performed for further evaluation. Electronically Signed   By: Marlaine Hind M.D.   On: 02/06/2019 13:25        Scheduled Meds: . amLODipine  5 mg Oral Daily  . aspirin  325 mg Oral Daily  . atorvastatin  20 mg Oral q1800  . cilostazol  100 mg Oral BID  . gabapentin  100 mg Oral BID  . heparin  5,000 Units Subcutaneous Q8H  . insulin glargine  14 Units Subcutaneous Daily  . labetalol  100 mg Oral BID  . pantoprazole  40 mg Oral Daily   Continuous Infusions:   LOS: 0 days    Time spent: 55 minutes with over 50% of the time coordinating the patient's  care    Harold Hedge, DO [facility] Pager [phone]  If 7PM-7AM, please contact night-coverage [email or web address] Password [email or web address] 02/07/2019, 2:27 PM

## 2019-02-07 NOTE — Evaluation (Signed)
Physical Therapy Evaluation and Discharge Patient Details Name: Julie Brewer MRN: [id number] DOB: 09-27-1950 Today's Date: 02/07/2019   History of Present Illness  69 y.o. female brought by EMS on 02/06/19 due to unresponsive x 10 minutes. PMHx includes blindness, DM, asthma, HTN, PVD, stroke, CHF, syncope with loop recorder placed 2018, CKD. Orthostatic vital signs normal. Pt admitted due to AKI on CKD.   Clinical Impression   Patient evaluated by Physical Therapy with no further acute PT needs identified. Patient walked 150 ft on room air with sats >93% throughout. No imbalance. Requires an arm to hold onto because she does not have her sight cane with her.  All education has been completed and the patient has no further questions. PT is signing off. Thank you for this referral.     Follow Up Recommendations No PT follow up    Equipment Recommendations  None recommended by PT    Recommendations for Other Services       Precautions / Restrictions Precautions Precautions: Other (comment) Precaution Comments: blind      Mobility  Bed Mobility Overal bed mobility: Independent                Transfers Overall transfer level: Independent Equipment used: None                Ambulation/Gait Ambulation/Gait assistance: Counsellor (Feet): 150 Feet Assistive device: 1 person hand held assist(pt held PTs arm due to blindness) Gait Pattern/deviations: Step-through pattern;Decreased stride length     General Gait Details: pt walks slower due to unfamiliar environment  Stairs            Wheelchair Mobility    Modified Rankin (Stroke Patients Only)       Balance Overall balance assessment: No apparent balance deficits (not formally assessed)                                           Pertinent Vitals/Pain Pain Assessment: No/denies pain    Home Living Family/patient expects to be discharged to:: Private  residence Living Arrangements: Other relatives(niece) Available Help at Discharge: Family;Available PRN/intermittently(niece works) Type of Home: House Home Access: Stairs to enter Entrance Stairs-Rails: Building surveyor of Steps: 4 Home Layout: One level Home Equipment: Other (comment)(sight/blind cane)      Prior Function Level of Independence: Independent with assistive device(s)   Gait / Transfers Assistance Needed: in home no device (familiar environment); outside uses sight cane  ADL's / Homemaking Assistance Needed: Independent with B/D        Hand Dominance        Extremity/Trunk Assessment   Upper Extremity Assessment Upper Extremity Assessment: Generalized weakness    Lower Extremity Assessment Lower Extremity Assessment: Generalized weakness    Cervical / Trunk Assessment Cervical / Trunk Assessment: Normal  Communication   Communication: No difficulties  Cognition Arousal/Alertness: Awake/alert Behavior During Therapy: WFL for tasks assessed/performed Overall Cognitive Status: Within Functional Limits for tasks assessed                                        General Comments General comments (skin integrity, edema, etc.): sats >93% on room air    Exercises     Assessment/Plan    PT Assessment Patent does not need  any further PT services  PT Problem List         PT Treatment Interventions      PT Goals (Current goals can be found in the Care Plan section)  Acute Rehab PT Goals Patient Stated Goal: keep her strength up  PT Goal Formulation: All assessment and education complete, DC therapy    Frequency     Barriers to discharge        Co-evaluation               AM-PAC PT "6 Clicks" Mobility  Outcome Measure Help needed turning from your back to your side while in a flat bed without using bedrails?: None Help needed moving from lying on your back to sitting on the side of a flat bed without using  bedrails?: None Help needed moving to and from a bed to a chair (including a wheelchair)?: A Little Help needed standing up from a chair using your arms (e.g., wheelchair or bedside chair)?: A Little Help needed to walk in hospital room?: A Little Help needed climbing 3-5 steps with a railing? : A Little 6 Click Score: 20    End of Session   Activity Tolerance: Patient tolerated treatment well Patient left: in chair;with call bell/phone within reach Nurse Communication: Mobility status;Other (comment)(no PT needed) PT Visit Diagnosis: Difficulty in walking, not elsewhere classified (R26.2)    Time: 7793-9030 PT Time Calculation (min) (ACUTE ONLY): 25 min   Charges:   PT Evaluation $PT Eval Low Complexity: 1 Low PT Treatments $Gait Training: 8-22 mins         Arby Barrette, PT Pager [phone]   Rexanne Mano 02/07/2019, 4:02 PM

## 2019-02-07 NOTE — Plan of Care (Signed)

## 2019-02-08 ENCOUNTER — Inpatient Hospital Stay (HOSPITAL_COMMUNITY): Payer: Medicare Other

## 2019-02-08 DIAGNOSIS — R55 Syncope and collapse: Secondary | ICD-10-CM

## 2019-02-08 LAB — BASIC METABOLIC PANEL
Anion gap: 6 (ref 5–15)
BUN: 42 mg/dL — ABNORMAL HIGH (ref 8–23)
CO2: 17 mmol/L — ABNORMAL LOW (ref 22–32)
Calcium: 8 mg/dL — ABNORMAL LOW (ref 8.9–10.3)
Chloride: 113 mmol/L — ABNORMAL HIGH (ref 98–111)
Creatinine, Ser: 3.66 mg/dL — ABNORMAL HIGH (ref 0.44–1.00)
GFR calc Af Amer: 14 mL/min — ABNORMAL LOW (ref >60)
GFR calc non Af Amer: 12 mL/min — ABNORMAL LOW (ref >60)
Glucose, Bld: 78 mg/dL (ref 70–99)
Potassium: 4.7 mmol/L (ref 3.5–5.1)
Sodium: 136 mmol/L (ref 135–145)

## 2019-02-08 LAB — PHOSPHORUS: Phosphorus: 3.8 mg/dL (ref 2.5–4.6)

## 2019-02-08 LAB — CBC
HCT: 28 % — ABNORMAL LOW (ref 36.0–46.0)
Hemoglobin: 9 g/dL — ABNORMAL LOW (ref 12.0–15.0)
MCH: 25.6 pg — ABNORMAL LOW (ref 26.0–34.0)
MCHC: 32.1 g/dL (ref 30.0–36.0)
MCV: 79.8 fL — ABNORMAL LOW (ref 80.0–100.0)
Platelets: 91 10*3/uL — ABNORMAL LOW (ref 150–400)
RBC: 3.51 MIL/uL — ABNORMAL LOW (ref 3.87–5.11)
RDW: 15.8 % — ABNORMAL HIGH (ref 11.5–15.5)
WBC: 5 10*3/uL (ref 4.0–10.5)
nRBC: 0 % (ref 0.0–0.2)

## 2019-02-08 LAB — ECHOCARDIOGRAM LIMITED
Height: 63 in
Weight: 2292.78 oz

## 2019-02-08 LAB — GLUCOSE, CAPILLARY
Glucose-Capillary: 124 mg/dL — ABNORMAL HIGH (ref 70–99)
Glucose-Capillary: 136 mg/dL — ABNORMAL HIGH (ref 70–99)
Glucose-Capillary: 175 mg/dL — ABNORMAL HIGH (ref 70–99)
Glucose-Capillary: 76 mg/dL (ref 70–99)
Glucose-Capillary: 77 mg/dL (ref 70–99)
Glucose-Capillary: 80 mg/dL (ref 70–99)

## 2019-02-08 LAB — MAGNESIUM: Magnesium: 1.9 mg/dL (ref 1.7–2.4)

## 2019-02-08 MED ORDER — INSULIN GLARGINE 100 UNIT/ML ~~LOC~~ SOLN
10.0000 [IU] | Freq: Every day | SUBCUTANEOUS | Status: DC
  Administered 2019-02-08 – 2019-02-09 (×2): 10 [IU] via SUBCUTANEOUS
  Filled 2019-02-08 (×2): qty 0.1

## 2019-02-08 MED ORDER — HYDRALAZINE HCL 10 MG PO TABS: 10.0000 mg | ORAL_TABLET | Freq: Three times a day (TID) | ORAL | Status: DC

## 2019-02-08 NOTE — Progress Notes (Signed)
PROGRESS NOTE    Julie Brewer    Code Status: DNR  LPF:[id number] DOB: 04-04-1950 DOA: 02/06/2019  PCP: [name], MD    Hospital Summary  This is a 69 year old female with past medical history of hypertension, CKD 4 with proteinuria, type 2 diabetes and follows with endocrinology, tobacco use, hypoglycemic episodes noted in the past, peripheral vascular disease, multiple syncopal episodes in the past with loop recorder placed 06/20/2016 without evidence of arrhythmia and follows with cardiology, who presented to the ED on 1/8 for syncopal episode of unclear etiology.  She was found to be COVID-19 positive on admission.  Also with AKI which had improvement with IV fluid. not started on any COVID-19 treatment as she remained hemodynamically stable on room air.    A & P   Active Problems:   Syncope   1. Syncopal episode with history of recurrent syncope of unknown etiology a. 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 vascular disease (Hx PVD and TIAs) vs. valvular disease (echo in May unremarkable)  b. Recurrent syncope with unremarkable work-up by cardio in the past, loop recorder placed 06/20/2016 by Dr. Sallyanne Kuster, interrogated 02/06/2019 - unremarkable c. Carotid ultrasound, preliminary report unremarkable d. Limited echo pending e. POC glucose has remained mainly in 70s on repeat checks, will decrease Lantus to 10 u QD and discontinue sliding scale 2. AKI on CKD 4 a. Cr 3.6, baseline 2.7-2.9 b. Follows with nephro outpatient c. 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 Previously on ACE then ARB for proteinuria but discontinued in the past e. Encourage PO intake 3. Type 2 diabetes a. Follows with endocrinology, concern for hypoglycemic unawareness in the past b. Does not check blood sugar regularly at home c. Off Metformin due to poor creatinine clearance d. HA1C 7.1 on 02/07/19 e. Glucose 77 this a.m., asymptomatic f. Insulin as above 4. COVID-19 a. Tolerating room air, No indication for treatment 5. Non-anion gap metabolic acidosis, possibly hyperchloremic acidosis a. Improving b. Increase p.o. intake 6. Hypomagnesemia a. Resolved 7. Hyperlipidemia on statin 8. Hypertension, difficulty to control in the past a. Increased Amlodipine to 10 mg b. Discontinued labetalol for concern of hypoglycemia unawareness 9. PVD a. Continue cilostazol b. Carotid ultrasound as above 10. Iron deficiency anemia a. continue iron supplement 11. Tobacco use with emphysema a. Smokes half pack per day b. Tobacco cessation provided 12. Nontender Right eye swelling, unknown etiology, nearly resolved a. Continue eyedrops  DVT prophylaxis: Heparin Diet: Carb control Family Communication: Patient's daughter and niece Le[place] [phone]) has been updated today Disposition Plan: monitor renal function, follow up echo, monitoring sugars. Diabetic coordinator consulted. Hopeful discharge in AM.   Consultants  Discussed with neurology over the phone who does not believe this to be seizure-like in nature but more likely vascular  Procedures  None  Antibiotics   Anti-infectives (From admission, onward)   None           Subjective   Seen and examined at bedside no acute distress resting comfortably.  On room air.  Reports minimal nonproductive cough but otherwise denies any shortness of breath or chest pain.  Nuys any pain in her eye and reports swelling has improved.  Denies any further  symptoms from presentation.  Objective   Vitals:   02/08/19 0400 02/08/19 0449 02/08/19 0512 02/08/19 1301  BP:  (!) 144/84  (!) 152/64  Pulse: 89 95 88   Resp: (!) 22 19 (!) 22   Temp:  97.9 F (36.6 C)  97.9 F (36.6 C)  TempSrc:  Oral  Axillary  SpO2: 93% 94% 94%   Weight:      Height:        Intake/Output Summary (Last 24 hours) at 02/08/2019 1424 Last data filed at 02/08/2019 0902 Gross per 24 hour  Intake 780 ml  Output --  Net 780 ml   Filed Weights   02/06/19 1251 02/07/19 0251  Weight: 63.5 kg 65 kg    Examination:  Physical Exam Vitals and nursing note reviewed.  Constitutional:      Appearance: Normal appearance.  HENT:     Head: Normocephalic and atraumatic.  Eyes:     General:        Right eye: No discharge.        Left eye: No discharge.     Comments: Right eyelid near normal  Cardiovascular:     Pulses: Normal pulses.  Pulmonary:     Effort: Pulmonary effort is normal.     Breath sounds: Normal breath sounds.  Abdominal:     General: Abdomen is flat.     Palpations: Abdomen is soft.  Musculoskeletal:        General: No swelling or tenderness.  Neurological:     Mental Status: She is alert. Mental status is at baseline.  Psychiatric:        Mood and Affect: Mood normal.        Behavior: Behavior normal.     Data Reviewed: I have personally reviewed following labs and imaging studies  CBC: Recent Labs  Lab 02/06/19 1335 02/07/19 0425 02/08/19 0250  WBC 6.9 6.5 5.0  NEUTROABS 5.0  --   --   HGB 9.2* 9.2* 9.0*  HCT 30.1* 29.7* 28.0*  MCV 84.6 82.5 79.8*  PLT 224 228 91*   Basic Metabolic Panel: Recent Labs  Lab 02/06/19 1335 02/07/19 0425 02/08/19 0250  NA 141 140 136  K 4.9 4.5 4.7  CL 113* 113* 113*  CO2 18* 16* 17*  GLUCOSE 139* 86 78  BUN 56* 49* 42*  CREATININE 4.34* 3.71* 3.66*  CALCIUM 8.4* 8.1* 8.0*  MG  --  1.6* 1.9  PHOS  --  4.0 3.8   GFR: Estimated Creatinine Clearance: 13.7 mL/min (A) (by C-G formula  based on SCr of 3.66 mg/dL (H)). Liver Function Tests: Recent Labs  Lab 02/06/19 1335  AST 15  ALT 19  ALKPHOS 88  BILITOT 0.2*  PROT 5.9*  ALBUMIN 3.0*   No results for input(s): LIPASE, AMYLASE in the last 168 hours. No results for input(s): AMMONIA in the last 168 hours. Coagulation Profile: No results for input(s): INR, PROTIME in the last 168 hours. Cardiac Enzymes: No results for input(s): CKTOTAL, CKMB, CKMBINDEX, TROPONINI in the last 168 hours. BNP (last 3 results) No results for input(s): PROBNP in the last 8760 hours. HbA1C: Recent Labs    02/07/19 0425  HGBA1C 7.1*   CBG: Recent Labs  Lab 02/07/19 2000 02/08/19 0013 02/08/19 0448 02/08/19 0749 02/08/19 1259  GLUCAP 125* 80 76 124* 77   Lipid Profile: No results for input(s): CHOL, HDL, LDLCALC, TRIG,  CHOLHDL, LDLDIRECT in the last 72 hours. Thyroid Function Tests: No results for input(s): TSH, T4TOTAL, FREET4, T3FREE, THYROIDAB in the last 72 hours. Anemia Panel: No results for input(s): VITAMINB12, FOLATE, FERRITIN, TIBC, IRON, RETICCTPCT in the last 72 hours. Sepsis Labs: Recent Labs  Lab 02/07/19 1059 02/07/19 1434  LATICACIDVEN 0.8 0.7    Recent Results (from the past 240 hour(s))  SARS CORONAVIRUS 2 (TAT 6-24 HRS) Nasopharyngeal Nasopharyngeal Swab     Status: Abnormal   Collection Time: 02/06/19  3:17 PM   Specimen: Nasopharyngeal Swab  Result Value Ref Range Status   SARS Coronavirus 2 POSITIVE (A) NEGATIVE Final    Comment: RESULT CALLED TO, READ BACK BY AND VERIFIED WITH: J.ROMAS RN 2046 02/06/19 MCCORMICK K (NOTE) SARS-CoV-2 target nucleic acids are DETECTED. The SARS-CoV-2 RNA is generally detectable in upper and lower respiratory specimens during the acute phase of infection. Positive results are indicative of the presence of SARS-CoV-2 RNA. Clinical correlation with patient history and other diagnostic information is  necessary to determine patient infection status. Positive  results do not rule out bacterial infection or co-infection with other viruses.  The expected result is Negative. Fact Sheet for Patients: SugarRoll.be Fact Sheet for Healthcare Providers: https://www.woods-mathews.com/ This test is not yet approved or cleared by the [place] FDA and  has been authorized for detection and/or diagnosis of SARS-CoV-2 by FDA under an Emergency Use Authorization (EUA). This EUA will remain  in effect (meaning this test can be used) for th e duration of the COVID-19 declaration under Section 564(b)(1) of the Act, 21 U.S.C. section 360bbb-3(b)(1), unless the authorization is terminated or revoked sooner. Performed at [facility], [place] [address], [place], [place] [address]   MRSA PCR Screening     Status: None   Collection Time: 02/07/19  2:48 AM   Specimen: Nasal Mucosa; Nasopharyngeal  Result Value Ref Range Status   MRSA by PCR NEGATIVE NEGATIVE Final    Comment:        The GeneXpert MRSA Assay (FDA approved for NASAL specimens only), is one component of a comprehensive MRSA colonization surveillance program. It is not intended to diagnose MRSA infection nor to guide or monitor treatment for MRSA infections. Performed at [facility], [place] [address] [place], [place] [address]          Radiology Studies: CT HEAD WO CONTRAST  Result Date: 02/06/2019 CLINICAL DATA:  Syncope. Episode of unresponsiveness. No reported injury. EXAM: CT HEAD WITHOUT CONTRAST TECHNIQUE: Contiguous axial images were obtained from the base of the skull through the vertex without intravenous contrast. COMPARISON:  07/17/2009 head CT. FINDINGS: Brain: No evidence of parenchymal hemorrhage or extra-axial fluid collection. No mass lesion, mass effect, or midline shift. No CT evidence of acute infarction. Nonspecific moderate subcortical and periventricular white matter hypodensity, most in keeping with chronic small  vessel ischemic change. Cerebral volume is age appropriate. No ventriculomegaly. Vascular: No acute abnormality. Skull: No evidence of calvarial fracture. Sinuses/Orbits: No fluid levels. Mild partial opacification of the right ethmoidal air cells. Other:  The mastoid air cells are unopacified. IMPRESSION: 1.  No evidence of acute intracranial abnormality. 2. Moderate chronic small vessel ischemic changes in the cerebral white matter. Electronically Signed   By: Ilona Sorrel M.D.   On: 02/06/2019 18:28   CT Chest Wo Contrast  Result Date: 02/06/2019 CLINICAL DATA:  Altered mental status. EXAM: CT CHEST WITHOUT CONTRAST TECHNIQUE: Multidetector CT imaging of the chest was performed following the  standard protocol without IV contrast. COMPARISON:  None. FINDINGS: Cardiovascular: There is moderate severity calcification of the thoracic aorta. Normal heart size. No pericardial effusion. Marked severity coronary artery calcification is seen. Mediastinum/Nodes: Lungs/Pleura: There is mild emphysematous lung disease. Mild atelectasis is seen along the posterior aspects of the bilateral lung bases. There is no evidence of a pleural effusion or pneumothorax. Upper Abdomen: Tiny gallstones are seen within the gallbladder lumen. Musculoskeletal: No chest wall mass or suspicious bone lesions identified. IMPRESSION: 1. Mild bibasilar atelectasis. 2. Mild emphysematous lung disease. Emphysema (ICD10-J43.9). Electronically Signed   By: Virgina Norfolk M.D.   On: 02/06/2019 17:06   VAS US CAROTID  Result Date: 02/08/2019 Carotid Arterial Duplex Study Indications: Syncope. Limitations  Today's exam was limited due to the high bifurcation of the carotid              and suboptimal tissue properties. Performing Technologist: Antonieta Pert RDMS, RVT  Examination Guidelines: A complete evaluation includes B-mode imaging, spectral Doppler, color Doppler, and power Doppler as needed of all accessible portions of each vessel.  Bilateral testing is considered an integral part of a complete examination. Limited examinations for reoccurring indications may be performed as noted.  Right Carotid Findings: +----------+--------+--------+--------+-------------------------------+--------+           PSV cm/sEDV cm/sStenosisPlaque Description             Comments +----------+--------+--------+--------+-------------------------------+--------+ CCA Prox  63      13                                                      +----------+--------+--------+--------+-------------------------------+--------+ CCA Mid   100     19              focal, hyperechoic and calcific         +----------+--------+--------+--------+-------------------------------+--------+ CCA Distal111     28                                                      +----------+--------+--------+--------+-------------------------------+--------+ ICA Prox  104     27                                                      +----------+--------+--------+--------+-------------------------------+--------+ ICA Mid   96      24                                                      +----------+--------+--------+--------+-------------------------------+--------+ ICA Distal72      24                                                      +----------+--------+--------+--------+-------------------------------+--------+ ECA       168  18                                                      +----------+--------+--------+--------+-------------------------------+--------+ +----------+--------+-------+--------+-------------------+           PSV cm/sEDV cmsDescribeArm Pressure (mmHG) +----------+--------+-------+--------+-------------------+ [id number]                                        +----------+--------+-------+--------+-------------------+ +---------+--------+--+--------+--+---------+ VertebralPSV cm/s86EDV cm/s17Antegrade  +---------+--------+--+--------+--+---------+ Tissue shadowing near bifrucation bilaterally. Left Carotid Findings: +----------+--------+--------+--------+------------------------+--------+           PSV cm/sEDV cm/sStenosisPlaque Description      Comments +----------+--------+--------+--------+------------------------+--------+ CCA Prox  94      21                                               +----------+--------+--------+--------+------------------------+--------+ CCA Mid                           focal and hyperechoic            +----------+--------+--------+--------+------------------------+--------+ CCA Distal94      22              diffuse and heterogenous         +----------+--------+--------+--------+------------------------+--------+ ICA Prox  110     23                                               +----------+--------+--------+--------+------------------------+--------+ ICA Mid   111     31                                               +----------+--------+--------+--------+------------------------+--------+ ICA Distal114     26                                               +----------+--------+--------+--------+------------------------+--------+ ECA       133     21                                               +----------+--------+--------+--------+------------------------+--------+ +----------+--------+--------+--------+-------------------+           PSV cm/sEDV cm/sDescribeArm Pressure (mmHG) +----------+--------+--------+--------+-------------------+ [id number]                                         +----------+--------+--------+--------+-------------------+ +---------+--------+--+--------+--+---------+ VertebralPSV cm/s69EDV cm/s21Antegrade +---------+--------+--+--------+--+---------+ Tissue shadowing near bifrucation bilaterally. Summary: Right Carotid: Velocities in the right ICA are consistent with a 1-39% stenosis.  Left Carotid: Velocities in the left ICA  are consistent with a 1-39% stenosis.               Multiple nodules noted in left thyroid. Vertebrals:  Bilateral vertebral arteries demonstrate antegrade flow. Subclavians: Normal flow hemodynamics were seen in bilateral subclavian              arteries. *See table(s) above for measurements and observations.     Preliminary         Scheduled Meds: . amLODipine  10 mg Oral Daily  . aspirin  325 mg Oral Daily  . atorvastatin  20 mg Oral q1800  . cilostazol  100 mg Oral BID  . gabapentin  100 mg Oral BID  . heparin  5,000 Units Subcutaneous Q8H  . insulin glargine  10 Units Subcutaneous Daily  . pantoprazole  40 mg Oral Daily   Continuous Infusions:   LOS: 1 day    Time spent: 25 minutes with over 50% of the time coordinating the patient's care    Harold Hedge, DO [facility] Pager [phone]  If 7PM-7AM, please contact night-coverage [email or web address] Password [facility] 02/08/2019, 2:24 PM

## 2019-02-08 NOTE — Progress Notes (Signed)
Carotid duplex complete. Please see CV Proc tab for preliminary results. [place], RVT 11:53 AM  02/08/2019

## 2019-02-08 NOTE — Progress Notes (Signed)
  Echocardiogram 2D Echocardiogram has been performed.  Julie Brewer 02/08/2019, 3:41 PM

## 2019-02-09 ENCOUNTER — Encounter (HOSPITAL_COMMUNITY): Payer: Self-pay | Admitting: Internal Medicine

## 2019-02-09 ENCOUNTER — Inpatient Hospital Stay (HOSPITAL_COMMUNITY): Payer: Medicare Other

## 2019-02-09 DIAGNOSIS — E872 Acidosis: Secondary | ICD-10-CM

## 2019-02-09 LAB — COMPREHENSIVE METABOLIC PANEL
ALT: 24 U/L (ref 0–44)
AST: 23 U/L (ref 15–41)
Albumin: 2.5 g/dL — ABNORMAL LOW (ref 3.5–5.0)
Alkaline Phosphatase: 78 U/L (ref 38–126)
Anion gap: 9 (ref 5–15)
BUN: 48 mg/dL — ABNORMAL HIGH (ref 8–23)
CO2: 16 mmol/L — ABNORMAL LOW (ref 22–32)
Calcium: 7.8 mg/dL — ABNORMAL LOW (ref 8.9–10.3)
Chloride: 111 mmol/L (ref 98–111)
Creatinine, Ser: 4.48 mg/dL — ABNORMAL HIGH (ref 0.44–1.00)
GFR calc Af Amer: 11 mL/min — ABNORMAL LOW (ref >60)
GFR calc non Af Amer: 10 mL/min — ABNORMAL LOW (ref >60)
Glucose, Bld: 112 mg/dL — ABNORMAL HIGH (ref 70–99)
Potassium: 5 mmol/L (ref 3.5–5.1)
Sodium: 136 mmol/L (ref 135–145)
Total Bilirubin: 0.4 mg/dL (ref 0.3–1.2)
Total Protein: 5.1 g/dL — ABNORMAL LOW (ref 6.5–8.1)

## 2019-02-09 LAB — CBC
HCT: 28.8 % — ABNORMAL LOW (ref 36.0–46.0)
Hemoglobin: 8.9 g/dL — ABNORMAL LOW (ref 12.0–15.0)
MCH: 25.5 pg — ABNORMAL LOW (ref 26.0–34.0)
MCHC: 30.9 g/dL (ref 30.0–36.0)
MCV: 82.5 fL (ref 80.0–100.0)
Platelets: 196 10*3/uL (ref 150–400)
RBC: 3.49 MIL/uL — ABNORMAL LOW (ref 3.87–5.11)
RDW: 15.8 % — ABNORMAL HIGH (ref 11.5–15.5)
WBC: 5.1 10*3/uL (ref 4.0–10.5)
nRBC: 0 % (ref 0.0–0.2)

## 2019-02-09 LAB — IRON AND TIBC
Iron: 20 ug/dL — ABNORMAL LOW (ref 28–170)
Saturation Ratios: 11 % (ref 10.4–31.8)
TIBC: 175 ug/dL — ABNORMAL LOW (ref 250–450)
UIBC: 155 ug/dL

## 2019-02-09 LAB — GLUCOSE, CAPILLARY
Glucose-Capillary: 104 mg/dL — ABNORMAL HIGH (ref 70–99)
Glucose-Capillary: 115 mg/dL — ABNORMAL HIGH (ref 70–99)
Glucose-Capillary: 126 mg/dL — ABNORMAL HIGH (ref 70–99)
Glucose-Capillary: 147 mg/dL — ABNORMAL HIGH (ref 70–99)
Glucose-Capillary: 166 mg/dL — ABNORMAL HIGH (ref 70–99)
Glucose-Capillary: 193 mg/dL — ABNORMAL HIGH (ref 70–99)

## 2019-02-09 LAB — FERRITIN: Ferritin: 101 ng/mL (ref 11–307)

## 2019-02-09 LAB — TSH: TSH: 1.044 u[IU]/mL (ref 0.350–4.500)

## 2019-02-09 LAB — MAGNESIUM: Magnesium: 1.9 mg/dL (ref 1.7–2.4)

## 2019-02-09 LAB — PHOSPHORUS: Phosphorus: 4.4 mg/dL (ref 2.5–4.6)

## 2019-02-09 MED ORDER — INSULIN GLARGINE 100 UNIT/ML ~~LOC~~ SOLN
8.0000 [IU] | Freq: Every day | SUBCUTANEOUS | Status: DC
  Filled 2019-02-09: qty 0.08

## 2019-02-09 MED ORDER — SODIUM CHLORIDE 0.9 % IV SOLN: INTRAVENOUS | Status: AC

## 2019-02-09 MED ORDER — DARBEPOETIN ALFA 40 MCG/0.4ML IJ SOSY
40.0000 ug | PREFILLED_SYRINGE | INTRAMUSCULAR | Status: DC
  Administered 2019-02-09: 40 ug via SUBCUTANEOUS
  Filled 2019-02-09: qty 0.4

## 2019-02-09 MED ORDER — DARBEPOETIN ALFA 60 MCG/0.3ML IJ SOSY
60.0000 ug | PREFILLED_SYRINGE | INTRAMUSCULAR | Status: DC
  Filled 2019-02-09: qty 0.3

## 2019-02-09 NOTE — Progress Notes (Signed)
   02/09/19 0010  MEWS Score  Resp (!) 21  ECG Heart Rate (!) 102  Pulse Rate (!) 102  SpO2 92 %  O2 Device Room Air  MEWS Score  MEWS RR 1  MEWS Pulse 1  MEWS Systolic 0  MEWS LOC 0  MEWS Temp 0  MEWS Score 2  MEWS Score Color Yellow  MEWS Assessment  Is this an acute change? Yes  Provider Notification  Provider Name/Title M. Sharlet Salina, NP  Date Provider Notified 02/09/19  Time Provider Notified 0007  Notification Type Page  Notification Reason Change in status  Response See new orders  Date of Provider Response 02/09/19  Time of Provider Response 0025 (NP called.)   BP 156/68. Temp earlier was 100.1. Tylenol PO given. Temp now 98.3. When patient was sleeping in bed, SpO2 as low as 88% on RA. Patient easily arousable. While awake, SpO2 92-94% on RA. HR was previously mostly 80s, now mostly high 90s-100s. MEWS yellow. Patient denies chest pain, SOB, dizziness. No distress noted. M. Sharlet Salina, NP notified of all of this information. Will apply O2 PRN to keep SpO2 >88%. Will continue to monitor.

## 2019-02-09 NOTE — Progress Notes (Signed)
Inpatient Diabetes Program Recommendations  AACE/ADA: New Consensus Statement on Inpatient Glycemic Control   Target Ranges:  Prepandial:   less than 140 mg/dL      Peak postprandial:   less than 180 mg/dL (1-2 hours)      Critically ill patients:  140 - 180 mg/dL   Results for Julie Brewer, Julie Brewer (MRN [id number]) as of 02/09/2019 10:08  Ref. Range 02/08/2019 04:48 02/08/2019 07:49 02/08/2019 12:59 02/08/2019 15:58 02/08/2019 20:31 02/09/2019 00:16 02/09/2019 03:34 02/09/2019 08:18  Glucose-Capillary Latest Ref Range: 70 - 99 mg/dL 76 124 (H)  Lantus 10 units 77 136 (H) 175 (H) 147 (H) 115 (H) 104 (H)   Review of Glycemic Control  Diabetes history: DM2 Outpatient Diabetes medications: Lantus 14 units daily, Regular 12 units with breakfast, Regular 5-7 units with supper Current orders for Inpatient glycemic control: Lantus 10 units daily  Inpatient Diabetes Program Recommendations:   Insulin - Basal: Please consider decreasing Lantus to 8 units daily.  Correction (SSI): While inpatient, please consider ordering Novolog 0-9 units TID with meals and Novolog 0-5 units QHS.  HgbA1C: A1C 7.1% on 02/07/19 indicating an average glucose of 157 mg/dl over the past 2-3 months. Patient is followed by Dr. [name] (Endocrinologist) and was last seen 11/07/18.  NOTE: Noted consult for Diabetes Coordinator regarding hypoglycemia unawareness. Spoke with patient over the phone regarding glycemic control and concern about hypoglycemia unawareness. Patient states that she can not see well and her family helps her with checking her glucose. Patient uses a FreeStyle Libre (glucose sensor) for glucose monitoring. Patient reports that her glucose usually runs in the 100's but goes up to 200's mg/dl at times depending on what she eats. Discussed current A1C of 7.1% on 02/07/19 indicating an average glucose of 157 mg/dl.   Patient reports that she is taking Lantus 14 units daily and Regular 12 units with breakfast and Regular  5-7 units with supper for DM control. Patient states that she has not been having any issues with hypoglycemia. She has experienced hypoglycemia in the past but her Lantus was decreased by a few units and she no longer has issues with hypoglycemia. Discussed hypoglycemia and reviewed symptoms of hypoglycemia. Patient denies any symptoms of hypoglycemia within the past few weeks. Patient states that she did not have any symptoms when glucose was 76 or 77 mg/dl yesterday. Discussed hypoglycemia unawareness and encouraged patient to be sure that Dr. [name] downloads her [facility] reader when she follows up so he will be able to see if her glucose is getting too low at any time.  Patient verbalized understanding of information discussed and she states that she has no questions at this time related to DM.  Thanks, Barnie Alderman, RN, MSN, CDE Diabetes Coordinator Inpatient Diabetes Program [phone] (Team Pager from 8am to 5pm)

## 2019-02-09 NOTE — Progress Notes (Signed)
MD notified of platelet count jumping from 91 to 196 overnight. Awaiting nephrology consult.

## 2019-02-09 NOTE — Progress Notes (Signed)
PROGRESS NOTE    Julie Brewer    Code Status: DNR  EQA:[id number] DOB: 1950/02/25 DOA: 02/06/2019  PCP: [name], MD    Hospital Summary  This is a 69 year old female with past medical history of hypertension, CKD 4 with proteinuria, type 2 diabetes and follows with endocrinology, tobacco use, hypoglycemic episodes noted in the past, peripheral vascular disease, multiple syncopal episodes in the past with loop recorder placed 06/20/2016 without evidence of arrhythmia and follows with cardiology, who presented to the ED on 1/8 for syncopal episode of unclear etiology.  She was found to be COVID-19 positive on admission.  Also with AKI which had improvement with IV fluid. not started on any COVID-19 treatment as she remained hemodynamically stable on room air.    A & P   Active Problems:   Syncope   1. Syncopal episode with history of recurrent syncope of unknown etiology a. 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 vascular disease (Hx PVD and TIAs) vs. valvular disease (echo in May unremarkable)  b. Recurrent syncope with unremarkable work-up by cardio in the past, loop recorder placed 06/20/2016 by Dr. Sallyanne Kuster, interrogated 02/06/2019 - unremarkable c. Carotid ultrasound, preliminary report unremarkable d. Limited echo unremarkable e. Insulin as below 2. AKI on CKD 4, worsened  a. baseline 2.7-2.9, Cr 9.7->9.8->>9.2->1.19 with metabolic acidosis b. 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 Previously on  ACE then ARB for proteinuria but discontinued in the past d. Nephrology Consult 3. Type 2 diabetes a. Follows with endocrinology, concern for hypoglycemic unawareness in the past b. Does not check blood sugar regularly at home c. Off Metformin due to poor creatinine clearance d. HA1C 7.1 on 02/07/19 e. Glucose 77 this a.m., asymptomatic f. Decrease lantus to 8 u 4. COVID-19 a. Low grade fevers now. Tolerating room air, No indication for treatment 5. Non-anion gap metabolic acidosis a. Nephrology consulted 6. Hypomagnesemia 7. Hyperlipidemia on statin 8. Hypertension, difficulty to control in the past a. Increased Amlodipine to 10 mg b. Discontinued labetalol for concern of hypoglycemia unawareness 9. PVD a. Continue cilostazol b. Carotid ultrasound as above 10. Iron deficiency anemia a. continue iron supplement 11. Tobacco use with emphysema a. Smokes half pack per day b. Tobacco cessation provided 12. Nontender Right eye swelling, unknown etiology, nearly resolved a. Continue eyedrops  DVT prophylaxis: Heparin Diet: Carb control Family Communication: Patient's daughter and niece Briere [phone]) has been updated today Disposition Plan: monitor renal function, follow up echo, monitoring sugars. Diabetic coordinator consulted. Hopeful discharge in AM.   Consultants  Discussed with neurology over the phone who does not believe this to be seizure-like in nature but more likely vascular  Procedures  None  Antibiotics   Anti-infectives (From admission, onward)   None           Subjective   Patient seen and examined at bedside no acute distress and resting comfortably.  No events overnight.  Tolerating diet.  Denies any chest pain, shortness of breath, fever, nausea, vomiting, urinary complaints.  Admits to dry cough. otherwise ROS negative   Objective   Vitals:   02/09/19 0554 02/09/19  2703 02/09/19 0654 02/09/19 0800  BP:  (!) 125/42  (!) 141/56  Pulse:  92 86 87 91  Resp: 16 20 20 19   Temp:  100 F (37.8 C)  98.6 F (37 C)  TempSrc:  Oral  Oral  SpO2: 95% 94% 94% 95%  Weight:      Height:        Intake/Output Summary (Last 24 hours) at 02/09/2019 1026 Last data filed at 02/09/2019 0800 Gross per 24 hour  Intake 350 ml  Output --  Net 350 ml   Filed Weights   02/06/19 1251 02/07/19 0251  Weight: 63.5 kg 65 kg    Examination:  Physical Exam Vitals and nursing note reviewed.  HENT:     Head: Normocephalic.     Mouth/Throat:     Mouth: Mucous membranes are moist.  Eyes:     Comments: Right eyelid at baseline  Cardiovascular:     Rate and Rhythm: Normal rate and regular rhythm.  Pulmonary:     Effort: Pulmonary effort is normal. No respiratory distress.     Comments: Dry cough Musculoskeletal:        General: No swelling or tenderness.  Neurological:     Mental Status: She is alert. Mental status is at baseline.  Psychiatric:        Mood and Affect: Mood normal.     Data Reviewed: I have personally reviewed following labs and imaging studies  CBC: Recent Labs  Lab 02/06/19 1335 02/07/19 0425 02/08/19 0250 02/09/19 0719  WBC 6.9 6.5 5.0 5.1  NEUTROABS 5.0  --   --   --   HGB 9.2* 9.2* 9.0* 8.9*  HCT 30.1* 29.7* 28.0* 28.8*  MCV 84.6 82.5 79.8* 82.5  PLT 224 228 91* 500   Basic Metabolic Panel: Recent Labs  Lab 02/06/19 1335 02/07/19 0425 02/08/19 0250 02/09/19 0719  NA 141 140 136 136  K 4.9 4.5 4.7 5.0  CL 113* 113* 113* 111  CO2 18* 16* 17* 16*  GLUCOSE 139* 86 78 112*  BUN 56* 49* 42* 48*  CREATININE 4.34* 3.71* 3.66* 4.48*  CALCIUM 8.4* 8.1* 8.0* 7.8*  MG  --  1.6* 1.9  --   PHOS  --  4.0 3.8  --    GFR: Estimated Creatinine Clearance: 11.2 mL/min (A) (by C-G formula based on SCr of 4.48 mg/dL (H)). Liver Function Tests: Recent Labs  Lab 02/06/19 1335 02/09/19 0719  AST 15 23  ALT 19 24  ALKPHOS 88 78  BILITOT 0.2* 0.4  PROT 5.9* 5.1*  ALBUMIN 3.0* 2.5*   No results for  input(s): LIPASE, AMYLASE in the last 168 hours. No results for input(s): AMMONIA in the last 168 hours. Coagulation Profile: No results for input(s): INR, PROTIME in the last 168 hours. Cardiac Enzymes: No results for input(s): CKTOTAL, CKMB, CKMBINDEX, TROPONINI in the last 168 hours. BNP (last 3 results) No results for input(s): PROBNP in the last 8760 hours. HbA1C: Recent Labs    02/07/19 0425  HGBA1C 7.1*   CBG: Recent Labs  Lab 02/08/19 1558 02/08/19 2031 02/09/19 0016 02/09/19 0334 02/09/19 0818  GLUCAP 136* 175* 147* 115* 104*   Lipid Profile: No results for input(s): CHOL, HDL, LDLCALC, TRIG, CHOLHDL, LDLDIRECT in the last 72 hours. Thyroid Function Tests: Recent Labs    02/09/19 0719  TSH 1.044   Anemia Panel: No results for input(s): VITAMINB12, FOLATE, FERRITIN, TIBC, IRON, RETICCTPCT in the last 72 hours. Sepsis Labs: Recent Labs  Lab 02/07/19 1059 02/07/19 1434  LATICACIDVEN 0.8 0.7    Recent Results (from the past 240 hour(s))  SARS CORONAVIRUS 2 (TAT 6-24 HRS) Nasopharyngeal Nasopharyngeal Swab     Status: Abnormal   Collection Time: 02/06/19  3:17 PM   Specimen: Nasopharyngeal Swab  Result Value Ref Range Status   SARS Coronavirus 2 POSITIVE (A) NEGATIVE Final    Comment: RESULT CALLED TO, READ BACK BY AND VERIFIED WITH: J.ROMAS RN 2046 02/06/19 MCCORMICK K (NOTE) SARS-CoV-2 target nucleic acids are DETECTED. The SARS-CoV-2 RNA is generally detectable in upper and lower respiratory specimens during the acute phase of infection. Positive results are indicative of the presence of SARS-CoV-2 RNA. Clinical correlation with patient history and other diagnostic information is  necessary to determine patient infection status. Positive results do not rule out bacterial infection or co-infection with other viruses.  The expected result is Negative. Fact Sheet for Patients: SugarRoll.be Fact Sheet for Healthcare  Providers: https://www.woods-mathews.com/ This test is not yet approved or cleared by the [place] FDA and  has been authorized for detection and/or diagnosis of SARS-CoV-2 by FDA under an Emergency Use Authorization (EUA). This EUA will remain  in effect (meaning this test can be used) for th e duration of the COVID-19 declaration under Section 564(b)(1) of the Act, 21 U.S.C. section 360bbb-3(b)(1), unless the authorization is terminated or revoked sooner. Performed at [facility], [place] [address], [place], [place] [address]   MRSA PCR Screening     Status: None   Collection Time: 02/07/19  2:48 AM   Specimen: Nasal Mucosa; Nasopharyngeal  Result Value Ref Range Status   MRSA by PCR NEGATIVE NEGATIVE Final    Comment:        The GeneXpert MRSA Assay (FDA approved for NASAL specimens only), is one component of a comprehensive MRSA colonization surveillance program. It is not intended to diagnose MRSA infection nor to guide or monitor treatment for MRSA infections. Performed at [facility], [place] [address], [place], [place] [address]          Radiology Studies: VAS US CAROTID  Result Date: 02/08/2019 Carotid Arterial Duplex Study Indications: Syncope. Limitations  Today's exam was limited due to the high bifurcation of the carotid              and suboptimal tissue properties. Performing Technologist: Antonieta Pert RDMS, RVT  Examination Guidelines: A complete evaluation includes B-mode imaging, spectral Doppler, color Doppler, and power Doppler as needed of all accessible portions of each vessel. Bilateral testing is considered an integral part of a complete examination. Limited examinations for reoccurring indications may be performed as noted.  Right Carotid Findings: +----------+--------+--------+--------+-------------------------------+--------+             PSV cm/s EDV cm/s Stenosis Plaque Description              Comments   +----------+--------+--------+--------+-------------------------------+--------+  CCA Prox   63       13                                                          +----------+--------+--------+--------+-------------------------------+--------+  CCA Mid    100      19                focal, hyperechoic and calcific           +----------+--------+--------+--------+-------------------------------+--------+  CCA Distal 111      28                                                          +----------+--------+--------+--------+-------------------------------+--------+  ICA Prox   104      27                                                          +----------+--------+--------+--------+-------------------------------+--------+  ICA Mid    96       24                                                          +----------+--------+--------+--------+-------------------------------+--------+  ICA Distal 72       24                                                          +----------+--------+--------+--------+-------------------------------+--------+  ECA        168      18                                                          +----------+--------+--------+--------+-------------------------------+--------+ +----------+--------+-------+--------+-------------------+             PSV cm/s EDV cms Describe Arm Pressure (mmHG)  +----------+--------+-------+--------+-------------------+  Subclavian 256                                            +----------+--------+-------+--------+-------------------+ +---------+--------+--+--------+--+---------+  Vertebral PSV cm/s 86 EDV cm/s 17 Antegrade  +---------+--------+--+--------+--+---------+ Tissue shadowing near bifrucation bilaterally. Left Carotid Findings: +----------+--------+--------+--------+------------------------+--------+             PSV cm/s EDV cm/s Stenosis Plaque Description       Comments  +----------+--------+--------+--------+------------------------+--------+  CCA Prox    94       21                                                   +----------+--------+--------+--------+------------------------+--------+  CCA Mid                               focal and hyperechoic              +----------+--------+--------+--------+------------------------+--------+  CCA Distal 94       22  diffuse and heterogenous           +----------+--------+--------+--------+------------------------+--------+  ICA Prox   110      23                                                   +----------+--------+--------+--------+------------------------+--------+  ICA Mid    111      31                                                   +----------+--------+--------+--------+------------------------+--------+  ICA Distal 114      26                                                   +----------+--------+--------+--------+------------------------+--------+  ECA        133      21                                                   +----------+--------+--------+--------+------------------------+--------+ +----------+--------+--------+--------+-------------------+             PSV cm/s EDV cm/s Describe Arm Pressure (mmHG)  +----------+--------+--------+--------+-------------------+  Subclavian 177                                             +----------+--------+--------+--------+-------------------+ +---------+--------+--+--------+--+---------+  Vertebral PSV cm/s 69 EDV cm/s 21 Antegrade  +---------+--------+--+--------+--+---------+ Tissue shadowing near bifrucation bilaterally. Summary: Right Carotid: Velocities in the right ICA are consistent with a 1-39% stenosis. Left Carotid: Velocities in the left ICA are consistent with a 1-39% stenosis.               Multiple nodules noted in left thyroid. Vertebrals:  Bilateral vertebral arteries demonstrate antegrade flow. Subclavians: Normal flow hemodynamics were seen in bilateral subclavian              arteries. *See table(s) above for measurements and  observations.  Electronically signed by Harold Barban MD on 02/08/2019 at 6:45:25 PM.    Final    ECHOCARDIOGRAM LIMITED  Result Date: 02/08/2019   ECHOCARDIOGRAM LIMITED REPORT   Patient Name:   LINSY EHRESMAN Date of Exam: 02/08/2019 Medical Rec #:  [id number]             Height:       63.0 in Accession #:    [id number]            Weight:       143.3 lb Date of Birth:  December 06, 1950              BSA:          1.68 m Patient Age:    82 years              BP:           152/64  mmHg Patient Gender: F                     HR:           86 bpm. Exam Location:  Inpatient  Procedure: Limited Color Doppler, Cardiac Doppler and Limited Echo Indications:    syncope 780.2  History:        Patient has prior history of Echocardiogram examinations, most                 recent 06/21/2018.  Sonographer:    Johny Chess Referring Phys: [id number] [place]  1. Left ventricular ejection fraction, by visual estimation, is 65 to 70%. The left ventricle has hyperdynamic function. There is mildly increased left ventricular wall thickness  2. Left ventricular diastolic parameters are indeterminate  3. Global right ventricle has normal systolc function.The right ventricular size is normal.  4. The mitral valve is normal in structure. No evidence of mitral valve regurgitation.  5. The tricuspid valve was normal in structure. Tricuspid valve regurgitation is trivial.  6. The aortic valve is tricuspid. Aortic valve regurgitation is not visualized. Mild aortic valve sclerosis is present, with no evidence of aortic valve stenosis.  7. The inferior vena cava is normal in size with greater than 50% respiratory variability, suggesting right atrial pressure of 3 mmHg.  8. TR signal is inadequate for assessing pulmonary artery systolic pressure. FINDINGS  Left Ventricle: Left ventricular ejection fraction, by visual estimation, is 65 to 70%. The left ventricle has hyperdynamic function. The left ventricle has no regional wall  motion abnormalities. There is mildly increased left ventricular wall thickness. Left ventricular diastolic parameters are indeterminate. Right Ventricle: The right ventricular size is normal. No increase in right ventricular wall thickness. Global RV systolic function is has normal systolic function. Left Atrium: Left atrial size was not assessed. Right Atrium: Right atrial size was not assessed. Right atrial pressure is estimated at 3 mmHg. Pericardium: There is no evidence of pericardial effusion is seen. There is no evidence of pericardial effusion. Mitral Valve: The mitral valve is normal in structure. MV Area by PHT, 3.48 cm. MV PHT, 63.22 msec. No evidence of mitral valve regurgitation. Tricuspid Valve: The tricuspid valve is normal in structure. Tricuspid valve regurgitation is trivial. Aortic Valve: The aortic valve is tricuspid. Aortic valve regurgitation is not visualized. Mild aortic valve sclerosis is present, with no evidence of aortic valve stenosis. Pulmonic Valve: The pulmonic valve was not well visualized. Pulmonic valve regurgitation is not visualized by color flow Doppler. Pulmonic regurgitation is not visualized by color flow Doppler. Venous: The inferior vena cava is normal in size with greater than 50% respiratory variability, suggesting right atrial pressure of 3 mmHg. Shunts: The interatrial septum was not well visualized.  LEFT VENTRICLE          Normals PLAX 2D LVIDd:         4.40 cm  3.6 cm   Diastology                 Normals LVIDs:         3.20 cm  1.7 cm   LV e' lateral:   7.83 cm/s 6.42 cm/s LV PW:         1.00 cm  1.4 cm   LV E/e' lateral: 10.9      15.4 LV IVS:        0.90 cm  1.3 cm   LV e' medial:  6.85 cm/s 6.96 cm/s LVOT diam:     1.80 cm  2.0 cm   LV E/e' medial:  12.5      6.96 LV SV:         47 ml    79 ml LV SV Index:   27.28    45 ml/m2 LVOT Area:     2.54 cm 3.14 cm2  LEFT ATRIUM         Index LA diam:    3.20 cm 1.91 cm/m  AORTIC VALVE             Normals LVOT Vmax:    119.00 cm/s LVOT Vmean:  76.900 cm/s 75 cm/s LVOT VTI:    0.215 m     25.3 cm  AORTA                 Normals Ao Root diam: 3.10 cm 31 mm MITRAL VALVE              Normals MV Area (PHT): 3.48 cm              SHUNTS MV PHT:        63.22 msec 55 ms      Systemic VTI:  0.22 m MV Decel Time: 218 msec   187 ms     Systemic Diam: 1.80 cm MV E velocity: 85.70 cm/s  103 cm/s MV A velocity: 122.00 cm/s 70.3 cm/s MV E/A ratio:  0.70        1.5  Oswaldo Milian MD Electronically signed by Oswaldo Milian MD Signature Date/Time: 02/08/2019/8:53:50 PMThe mitral valve is normal in structure.    Final         Scheduled Meds:  amLODipine  10 mg Oral Daily   aspirin  325 mg Oral Daily   atorvastatin  20 mg Oral q1800   cilostazol  100 mg Oral BID   gabapentin  100 mg Oral BID   heparin  5,000 Units Subcutaneous Q8H   insulin glargine  10 Units Subcutaneous Daily   pantoprazole  40 mg Oral Daily   Continuous Infusions:   LOS: 2 days    Time spent: 27 minutes with over 50% of the time coordinating the patient's care    Harold Hedge, DO [facility] Pager [phone]  If 7PM-7AM, please contact night-coverage [email or web address] Password [facility] 02/09/2019, 10:26 AM

## 2019-02-09 NOTE — Consult Note (Addendum)
[facility] Renal Consultation Note  Requesting MD: Dr.Segal Indication for Consultation: Acute on chronic renal failure  HPI:  Julie Brewer is a 69 y.o. female w/ PMH of blindness, CKD4, T2DM, CVA, HTN, PVD admit for syncope.  She was examined and evaluated at bedside this pm. She was observed resting comfortably at bedside chair. She states that she was in her usual state of health until yesterday when she 'passed out' and was brought in to the hospital. She mentions that she does not recall the inciting event but she only knows that she had a syncopal episode, which she states that she has had in the past. She follows with Dr.Kruska as her primary nephrologist and mentions that she takes furosemide 40mg  daily at home. She mentions that she has had prior conversations with Dr.Kruska regarding dialysis in the past and mentions that she would not want dialysis under any circumstances including for temporary period. Denies any fevers, chills, nausea, vomiting, lower extremity edema or respiratory distress.  On admission, she was noted to have an AKI and hypoglycemia. She was admitted for syncopal work-up and was found to be COVID-19 positive. Nephrology consulted for worsening AKI.  PMHx: Past Medical History:  Diagnosis Date  . Asthma    No probnlems recently  . 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 Diabetes mellitus    Type II per Dr [name]  (patient said type I)  . Fibroid   . GERD (gastroesophageal reflux disease)   . Glaucoma   . Hyperlipidemia   . Hypertension   . Iron deficiency anemia 03/09/2016  . Peripheral vascular disease ([place])   . Pneumonia 2006  . Shortness of breath dyspnea    with exdrtion, "Walkling too fast"  . Stroke [facility])    no residual    Past Surgical History:  Procedure Laterality Date  . ABDOMINAL HYSTERECTOMY    . BIOPSY  06/10/2017   Procedure: BIOPSY;  Surgeon: Ronnette Juniper, MD;   Location: [place];  Service: Gastroenterology;;  . CERVICAL FUSION     with graft from hip  . COLONOSCOPY  July 09, 2012  . DIRECT LARYNGOSCOPY N/A 06/07/2014   Procedure: DIRECT LARYNGOSCOPY with BIOPSY and EXCISION VOLLECULAR CYST;  Surgeon: Ruby Cola, MD;  Location: [facility] OR;  Service: ENT;  Laterality: N/A;  . ESOPHAGOGASTRODUODENOSCOPY (EGD) WITH PROPOFOL Left 06/10/2017   Procedure: ESOPHAGOGASTRODUODENOSCOPY (EGD) WITH PROPOFOL;  Surgeon: Ronnette Juniper, MD;  Location: [employer] [facility];  Service: Gastroenterology;  Laterality: Left;  . FLEXIBLE SIGMOIDOSCOPY Left 06/10/2017   Procedure: FLEXIBLE SIGMOIDOSCOPY;  Surgeon: Ronnette Juniper, MD;  Location: [place];  Service: Gastroenterology;  Laterality: Left;  . LOOP RECORDER INSERTION N/A 06/20/2016   Procedure: Loop Recorder Insertion;  Surgeon: [name], MD;  Location: [place] [facility];  Service: Cardiovascular;  Laterality: N/A;  . REFRACTIVE SURGERY Bilateral    both eyes  . SPINE SURGERY     lumbar    Family Hx:  Family History  Problem Relation Age of Onset  . Cancer Mother   . Heart disease Mother   . Diabetes Father   . Cancer Brother   . Cancer Brother   . Throat cancer Brother     Social History:  reports that she has been smoking cigarettes. She has a 30.00 pack-year smoking history. She has never used smokeless tobacco. She reports current alcohol use. She reports previous drug use. Drug: Methylphenidate.  Allergies:  Allergies  Allergen Reactions  . Morphine And Related Other (See Comments)    Hallucinations   . Penicillins Swelling and Rash    Throat swelling Did it involve swelling of the face/tongue/throat, SOB, or low BP? Yes Did it involve sudden or severe rash/hives, skin peeling, or any reaction on the inside of your mouth or nose? Yes Did you need to seek medical attention at a hospital or doctor's office? Yes When did it last happen?young child If all above answers are "NO", may proceed  with cephalosporin use.    Medications: Prior to Admission medications   Medication Sig Start Date End Date Taking? Authorizing Provider  amLODipine (NORVASC) 5 MG tablet Take 5 mg by mouth daily.   Yes [provider]  aspirin 325 MG EC tablet Take 325 mg by mouth daily.    Yes [provider]  atorvastatin (LIPITOR) 20 MG tablet Take 1 tablet by mouth once daily Patient taking differently: Take 20 mg by mouth daily.  12/01/18  Yes Elayne Snare, MD  bisacodyl (DULCOLAX) 5 MG EC tablet Take 5 mg by mouth daily as needed for moderate constipation.   Yes [provider]  cilostazol (PLETAL) 100 MG tablet Take 1 tablet by mouth twice daily Patient taking differently: Take 100 mg by mouth 2 (two) times daily.  12/01/18  Yes Marty Heck, MD  docusate sodium (COLACE) 100 MG capsule Take 200 mg by mouth daily.    Yes [provider]  ferrous sulfate 325 (65 FE) MG tablet Take 650 mg by mouth daily.   Yes [provider]  furosemide (LASIX) 40 MG tablet Take 40 mg by mouth daily.   Yes [provider]  gabapentin (NEURONTIN) 100 MG capsule Take 1 capsule by mouth twice daily Patient taking differently: Take 100 mg by mouth 2 (two) times daily.  12/23/18  Yes Elayne Snare, MD  insulin glargine (LANTUS) 100 UNIT/ML injection Inject 14 Units into the skin daily.   Yes [provider]  insulin regular (NOVOLIN R RELION) 100 units/mL injection Inject 5-12 Units into the skin See admin instructions. Inject 12 units subcutaneously every morning and 5-7 units in the evening (5 units for regular meal, 7 units if eating potatoes)   Yes [provider]  labetalol (NORMODYNE) 100 MG tablet Take 1 tablet by mouth twice daily Patient taking differently: Take 100 mg by mouth 2 (two) times daily.  10/02/18  Yes Elayne Snare, MD  omeprazole (PRILOSEC) 20 MG capsule TAKE 1 CAPSULE BY MOUTH ONCE DAILY ( DUE FOR OFFICE VISIT THIS MONTH) Patient  taking differently: Take 20 mg by mouth daily. Due for office visit this month 12/02/18  Yes Elayne Snare, MD  Vitamin D, Ergocalciferol, (DRISDOL) 1.25 MG (50000 UT) CAPS capsule TAKE ONE CAPSULE BY MOUTH EVERY 7 DAYS Patient taking differently: Take 50,000 Units by mouth every 30 (thirty) days. On or about the 1st of each month 01/30/18  Yes Elayne Snare, MD  glucose blood (FREESTYLE LITE) test strip USE AS INSTRUCTED TO CHECK BLOOD SUGAR ONCE DAILY; E11.8 09/10/18   Elayne Snare, MD  pantoprazole (PROTONIX) 40 MG tablet Take 1 tablet (40 mg total) by mouth daily for 30 days. 06/26/18 12/31/18  Damita Lack, MD   Labs:  CBC Latest Ref Rng & Units 02/09/2019 02/08/2019 02/07/2019  WBC 4.0 - 10.5 K/uL 5.1 5.0 6.5  Hemoglobin 12.0 - 15.0 g/dL 8.9(L) 9.0(L) 9.2(L)  Hematocrit 36.0 - 46.0 % 28.8(L) 28.0(L) 29.7(L)  Platelets  150 - 400 K/uL 196 91(L) 228   BMP Latest Ref Rng & Units 02/09/2019 02/08/2019 02/07/2019  Glucose 70 - 99 mg/dL 112(H) 78 86  BUN 8 - 23 mg/dL 48(H) 42(H) 49(H)  Creatinine 0.44 - 1.00 mg/dL 4.48(H) 3.66(H) 3.71(H)  Sodium 135 - 145 mmol/L 136 136 140  Potassium 3.5 - 5.1 mmol/L 5.0 4.7 4.5  Chloride 98 - 111 mmol/L 111 113(H) 113(H)  CO2 22 - 32 mmol/L 16(L) 17(L) 16(L)  Calcium 8.9 - 10.3 mg/dL 7.8(L) 8.0(L) 8.1(L)   ROS:  Pertinent items are noted in HPI.  Physical Exam: Vitals:   02/09/19 1200 02/09/19 1330  BP: (!) 159/56 135/64  Pulse: 92   Resp: 18   Temp: 98 F (36.7 C)   SpO2: 92%      General: Well-nourished, NAD, Pleasant HEENT: Bilateral blind eyes, no JVD Heart: RRR, S1 S2 wnl, no m/r/g Lungs: CTAB, no rales / no wheezes Abdomen: BS+, Soft, NTND Extremities: Peripheral pulses intact, no peripheral edema Skin: Dry, Warm, poor skin turgor Neuro: AAOx3  Assessment/Plan: 1.AKI on CKD: Acute (Bun 56, Creatinine 4.34) on chronic kidney disease 4 (Baseline BUN 34, Creatinine 2.96) admit after syncopal episode. Had interval improvement after 500cc bolus on  first day but is now worsening (4.34->3.71->3.66->4.48) Likely pre-renal in setting of syncope and COVID infection. Has not received additional fluids. Patient is blind and has difficulty with oral intake w/o assistance. Appear hypovolemic on exam. Could be at new baseline. Unclear urine output but having urinary output per patient.  - Renal ultrasound - Orthostatic vitals - Trend renal fx - Provide additional maintenance fluid overnight and re-check labs tomorrow am - Avoid nephrotoxic meds   Renal baseline is not great.  Has suffered some acute worsening in the setting of possible syncopal episode.  Does not appear wet on exam- is reasonable to give IVF and see where we are.  Pt does not seem symptomatic in the least and does not desire dialysis she says even in the short term    2. COVID pneumonia: Asymptomatic. Fever peak 100.1. O2 sat 99 on RA. Not receiving treatment.  3. Recurrent Syncope: Orthostatic vs hypoglycemic episodes. Recurrent issue with extensive work-up in the past. Echo w/o significant findings this admission. May need to f/u with endo outpatient for insulin dosing  3 Hypertension/volume  - BP 135/64  4. Anemia  - Stable at baseline. hgb 8.9. Borderline microcytic. Will check iron studies and provide Iron infusion if needed. Can also start Darbepoetin  Mosetta Anis 02/09/2019, 1:57 PM  PGY-2 [facility] Pager: [phone]  Attending attestation to follow  Patient seen and examined, agree with above note with above modifications. Pt with baseline CKD followed by [facility]-  Unclear what prompted ER visit but then was found to be covid positive and with worsening GFR- urine bland- likely hemodynamic.  Will try some fluids and also treat her anemia and watch.  If renal function not worse tomorrow and she is still clinically well possibly could go home  Corliss Parish, MD 02/09/2019

## 2019-02-09 NOTE — Plan of Care (Signed)

## 2019-02-09 NOTE — Progress Notes (Signed)
Pharmacy Consult - Aranesp  Anemia of chronic kidney disease HgB = 8.9  Plan: Aranesp 40 mcg sq Q Monday (approximately 0.5 mcg/kg) Pharmacy to sign off and follow peripherally for nursing charting/ hold if HgB > 11  Thank you Anette Guarneri, PharmD

## 2019-02-10 ENCOUNTER — Ambulatory Visit (INDEPENDENT_AMBULATORY_CARE_PROVIDER_SITE_OTHER): Payer: Medicare Other | Admitting: *Deleted

## 2019-02-10 ENCOUNTER — Other Ambulatory Visit: Payer: Medicare Other

## 2019-02-10 DIAGNOSIS — Z8673 Personal history of transient ischemic attack (TIA), and cerebral infarction without residual deficits: Secondary | ICD-10-CM | POA: Diagnosis not present

## 2019-02-10 DIAGNOSIS — E119 Type 2 diabetes mellitus without complications: Secondary | ICD-10-CM

## 2019-02-10 DIAGNOSIS — J069 Acute upper respiratory infection, unspecified: Secondary | ICD-10-CM

## 2019-02-10 LAB — GLUCOSE, CAPILLARY
Glucose-Capillary: 103 mg/dL — ABNORMAL HIGH (ref 70–99)
Glucose-Capillary: 133 mg/dL — ABNORMAL HIGH (ref 70–99)
Glucose-Capillary: 153 mg/dL — ABNORMAL HIGH (ref 70–99)
Glucose-Capillary: 222 mg/dL — ABNORMAL HIGH (ref 70–99)
Glucose-Capillary: 227 mg/dL — ABNORMAL HIGH (ref 70–99)
Glucose-Capillary: 350 mg/dL — ABNORMAL HIGH (ref 70–99)
Glucose-Capillary: 355 mg/dL — ABNORMAL HIGH (ref 70–99)
Glucose-Capillary: 408 mg/dL — ABNORMAL HIGH (ref 70–99)

## 2019-02-10 LAB — SODIUM, URINE, RANDOM: Sodium, Ur: 30 mmol/L

## 2019-02-10 LAB — CBC
HCT: 28.7 % — ABNORMAL LOW (ref 36.0–46.0)
Hemoglobin: 9 g/dL — ABNORMAL LOW (ref 12.0–15.0)
MCH: 25.4 pg — ABNORMAL LOW (ref 26.0–34.0)
MCHC: 31.4 g/dL (ref 30.0–36.0)
MCV: 81.1 fL (ref 80.0–100.0)
Platelets: 188 10*3/uL (ref 150–400)
RBC: 3.54 MIL/uL — ABNORMAL LOW (ref 3.87–5.11)
RDW: 15.5 % (ref 11.5–15.5)
WBC: 5.7 10*3/uL (ref 4.0–10.5)
nRBC: 0 % (ref 0.0–0.2)

## 2019-02-10 LAB — CUP PACEART REMOTE DEVICE CHECK
Date Time Interrogation Session: 20210112123034
Implantable Pulse Generator Implant Date: 20180523

## 2019-02-10 LAB — BASIC METABOLIC PANEL
Anion gap: 10 (ref 5–15)
BUN: 52 mg/dL — ABNORMAL HIGH (ref 8–23)
CO2: 13 mmol/L — ABNORMAL LOW (ref 22–32)
Calcium: 7.6 mg/dL — ABNORMAL LOW (ref 8.9–10.3)
Chloride: 111 mmol/L (ref 98–111)
Creatinine, Ser: 4.66 mg/dL — ABNORMAL HIGH (ref 0.44–1.00)
GFR calc Af Amer: 11 mL/min — ABNORMAL LOW (ref >60)
GFR calc non Af Amer: 9 mL/min — ABNORMAL LOW (ref >60)
Glucose, Bld: 128 mg/dL — ABNORMAL HIGH (ref 70–99)
Potassium: 4.9 mmol/L (ref 3.5–5.1)
Sodium: 134 mmol/L — ABNORMAL LOW (ref 135–145)

## 2019-02-10 LAB — URINALYSIS, ROUTINE W REFLEX MICROSCOPIC
Bilirubin Urine: NEGATIVE
Glucose, UA: NEGATIVE mg/dL
Ketones, ur: NEGATIVE mg/dL
Leukocytes,Ua: NEGATIVE
Nitrite: NEGATIVE
Protein, ur: 100 mg/dL — AB
Specific Gravity, Urine: 1.011 (ref 1.005–1.030)
pH: 5 (ref 5.0–8.0)

## 2019-02-10 LAB — C-REACTIVE PROTEIN: CRP: 6.3 mg/dL — ABNORMAL HIGH (ref <1.0)

## 2019-02-10 LAB — LACTIC ACID, PLASMA
Lactic Acid, Venous: 1 mmol/L (ref 0.5–1.9)
Lactic Acid, Venous: 1.2 mmol/L (ref 0.5–1.9)

## 2019-02-10 LAB — FERRITIN: Ferritin: 201 ng/mL (ref 11–307)

## 2019-02-10 LAB — D-DIMER, QUANTITATIVE: D-Dimer, Quant: 1.93 ug/mL-FEU — ABNORMAL HIGH (ref 0.00–0.50)

## 2019-02-10 LAB — CREATININE, URINE, RANDOM: Creatinine, Urine: 79.47 mg/dL

## 2019-02-10 LAB — PHOSPHORUS: Phosphorus: 3.9 mg/dL (ref 2.5–4.6)

## 2019-02-10 MED ORDER — DEXAMETHASONE SODIUM PHOSPHATE 10 MG/ML IJ SOLN
6.0000 mg | INTRAMUSCULAR | Status: AC
  Administered 2019-02-10 – 2019-02-19 (×10): 6 mg via INTRAVENOUS
  Filled 2019-02-10 (×10): qty 1

## 2019-02-10 MED ORDER — SODIUM BICARBONATE-DEXTROSE 150-5 MEQ/L-% IV SOLN
150.0000 meq | INTRAVENOUS | Status: AC
  Administered 2019-02-10 – 2019-02-11 (×2): 150 meq via INTRAVENOUS
  Filled 2019-02-10 (×3): qty 1000

## 2019-02-10 MED ORDER — SODIUM CHLORIDE 0.9 % IV SOLN
200.0000 mg | Freq: Once | INTRAVENOUS | Status: AC
  Administered 2019-02-10: 200 mg via INTRAVENOUS
  Filled 2019-02-10: qty 40

## 2019-02-10 MED ORDER — ASCORBIC ACID 500 MG PO TABS
500.0000 mg | ORAL_TABLET | Freq: Every day | ORAL | Status: DC
  Administered 2019-02-10 – 2019-02-20 (×11): 500 mg via ORAL
  Filled 2019-02-10 (×11): qty 1

## 2019-02-10 MED ORDER — INSULIN DETEMIR 100 UNIT/ML ~~LOC~~ SOLN
0.0750 [IU]/kg | Freq: Two times a day (BID) | SUBCUTANEOUS | Status: DC
  Administered 2019-02-10 – 2019-02-12 (×5): 5 [IU] via SUBCUTANEOUS
  Filled 2019-02-10 (×6): qty 0.05

## 2019-02-10 MED ORDER — ZINC SULFATE 220 (50 ZN) MG PO CAPS
220.0000 mg | ORAL_CAPSULE | Freq: Every day | ORAL | Status: DC
  Administered 2019-02-10 – 2019-02-20 (×11): 220 mg via ORAL
  Filled 2019-02-10 (×11): qty 1

## 2019-02-10 MED ORDER — SODIUM CHLORIDE 0.9 % IV SOLN
100.0000 mg | Freq: Every day | INTRAVENOUS | Status: AC
  Administered 2019-02-11 – 2019-02-14 (×4): 100 mg via INTRAVENOUS
  Filled 2019-02-10 (×4): qty 20

## 2019-02-10 MED ORDER — IPRATROPIUM-ALBUTEROL 20-100 MCG/ACT IN AERS
1.0000 | INHALATION_SPRAY | Freq: Four times a day (QID) | RESPIRATORY_TRACT | Status: DC
  Administered 2019-02-10 – 2019-02-11 (×4): 1 via RESPIRATORY_TRACT
  Filled 2019-02-10: qty 4

## 2019-02-10 MED ORDER — INSULIN ASPART 100 UNIT/ML ~~LOC~~ SOLN
0.0000 [IU] | SUBCUTANEOUS | Status: DC
  Administered 2019-02-10 (×2): 9 [IU] via SUBCUTANEOUS
  Administered 2019-02-10: 3 [IU] via SUBCUTANEOUS
  Administered 2019-02-11: 9 [IU] via SUBCUTANEOUS
  Administered 2019-02-11: 5 [IU] via SUBCUTANEOUS
  Administered 2019-02-11: 7 [IU] via SUBCUTANEOUS
  Administered 2019-02-11: 5 [IU] via SUBCUTANEOUS

## 2019-02-10 NOTE — Progress Notes (Signed)
Received call back from MD. No new orders at this time. Will recheck pt blood sugar at midnight. Will continue to monitor.

## 2019-02-10 NOTE — Progress Notes (Signed)
PROGRESS NOTE    Julie Brewer    Code Status: DNR  IPJ:[id number] DOB: 13-Apr-1950 DOA: 02/06/2019  PCP: [name], MD    Hospital Summary  This is a 69 year old female with past medical history of hypertension, CKD 4 with proteinuria, type 2 diabetes and follows with endocrinology, tobacco use, hypoglycemic episodes noted in the past, peripheral vascular disease, multiple syncopal episodes in the past with loop recorder placed 06/20/2016 without evidence of arrhythmia and follows with cardiology, who presented to the ED on 1/8 for syncopal episode of unclear etiology.  She was found to be COVID-19 positive on admission.  Also with AKI which had improvement with IV fluid.  Was not started on COVID-19 directed treatment until 1/12 as below.  1/11: Worsened AKI and metabolic acidosis, nephrology consulted started on IV fluids.  1/12: Febrile overnight, requiring O2 2 L/min nasal cannula.  Started on dexamethasone and remdesivir.  AKI worsened, started on bicarb drip.    A & P   Active Problems:   Syncope   1. 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 No recurrent episodes since insulin decreased and glucose remains stable b. 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 Recurrent syncope with unremarkable work-up by cardio in the past, loop recorder placed 06/20/2016 by Dr. Sallyanne Kuster, interrogated 02/06/2019 - unremarkable d. Carotid ultrasound unremarkable 1/10 e. Limited echo unremarkable 1/10 f. Now starting steroids, will start higher doses of insulin as below 2. AKI on CKD 4, worsened, COVID-19 GN? a. No improvement overnight with IV fluids b. 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 Previously on ACE then ARB for proteinuria but discontinued in the past d. Nephrology Consulted: ordered UA, urine sodium and creatinine 3. Metabolic acidosis a. Sodium sodium bicarb infusion 150 mEq in D5 75 cc/h per nephro 4. Type 2 diabetes a. Follows with endocrinology, concern for hypoglycemic unawareness in the past b. HA1C 7.1 on 02/07/19 c. Does not check blood sugar regularly at home d. Outpatient meds:Lantus 14 units daily, Regular 12 u with breakfast, Regular 5-7 u with dinner e. Tolerated Lantus 8 units monotherapy well while inpatient and could consider discharging on this regimen when ready f. Starting Decadron -> start COVID-19 hyperglycemic order set, starting with sensitive dosing given hypoglycemia and reduced renal function 5. SIRS secondary to COVID-19 a. Previously no O2 requirement and minimal symptoms therefore has not been treated b. 1/12 overnight: T max 103 F, tachycardic, tachypneic requiring 2 L/min O2, lactic acid negative c. Day 1/5 remdesivir d. Day 1/10 dexamethasone with insulin as above e. Check inflammatory markers 6. Hypomagnesemia 7. Hyperlipidemia on statin 8. Hypertension, difficulty to control in the past a. Increased Amlodipine to 10 mg b. Discontinued labetalol for concern of hypoglycemia unawareness 9. PVD a. Continue cilostazol b. Carotid ultrasound as above 10. Iron deficiency anemia a. continue iron supplement 11. Tobacco use with emphysema a. Smokes half pack per day b. Tobacco cessation provided 12. Atraumatic right eyelid swelling, unknown etiology, resolved a. Continue eyedrops  DVT prophylaxis: Heparin Diet: Carb control Family Communication: Patient's daughter and niece Julie Brewer [phone]) has  been updated today Disposition Plan: Now symptomatic Covid, starting COVID-19 treatment  Consultants  Discussed with neurology over the phone who does not believe this to be seizure-like in nature but more likely vascular  Nephrology  Procedures  None  Antibiotics   Anti-infectives (From admission, onward)   Start     Dose/Rate Route Frequency Ordered Stop   02/11/19 1000  remdesivir 100 mg in sodium chloride 0.9 % 100 mL IVPB     100 mg 200 mL/hr over 30 Minutes Intravenous Daily 02/10/19 0803 02/15/19 0959   02/10/19 1000  remdesivir 200 mg in sodium chloride 0.9% 250 mL IVPB     200 mg 580 mL/hr over 30 Minutes Intravenous Once 02/10/19 0803 02/10/19 1145           Subjective   Overnight patient spiked fever to 103 F, tachycardic and tachypneic and requiring 2 L/min O2 via nasal cannula.  Currently she states she feels okay, slightly fatigued with some persistent dry cough but denies any other complaints at this time.   Objective   Vitals:   02/10/19 0000 02/10/19 0623 02/10/19 0800 02/10/19 1026  BP: (!) 172/58 134/63 (!) 117/45   Pulse: (!) 111 (!) 107 95   Resp: (!) 25 20 (!) 22 18  Temp: 97.7 F (36.5 C) (!) 101.8 F (38.8 C)  98.2 F (36.8 C)  TempSrc: Oral Oral  Oral  SpO2: 91% 92% 96%   Weight:      Height:        Intake/Output Summary (Last 24 hours) at 02/10/2019 1359 Last data filed at 02/10/2019 1200 Gross per 24 hour  Intake 745.35 ml  Output --  Net 745.35 ml   Filed Weights   02/06/19 1251 02/07/19 0251  Weight: 63.5 kg 65 kg    Examination:  Physical Exam Vitals and nursing note reviewed.  HENT:     Head: Normocephalic.  Eyes:     Extraocular Movements: Extraocular movements intact.  Cardiovascular:     Rate and Rhythm: Normal rate and regular rhythm.  Pulmonary:     Effort: Pulmonary effort is normal. No respiratory distress.     Comments: Dry cough On O2 Neurological:     Mental Status: She is alert. Mental status is at  baseline.  Psychiatric:        Mood and Affect: Mood normal.        Behavior: Behavior normal.     Data Reviewed: I have personally reviewed following labs and imaging studies  CBC: Recent Labs  Lab 02/06/19 1335 02/07/19 0425 02/08/19 0250 02/09/19 0719 02/10/19 0446  WBC 6.9 6.5 5.0 5.1 5.7  NEUTROABS 5.0  --   --   --   --   HGB 9.2* 9.2* 9.0* 8.9* 9.0*  HCT 30.1* 29.7* 28.0* 28.8* 28.7*  MCV 84.6 82.5 79.8* 82.5 81.1  PLT 224 228 91* 196 378   Basic Metabolic Panel: Recent Labs  Lab 02/06/19 1335 02/07/19 0425 02/08/19 0250 02/09/19 0719 02/10/19 0446  NA 141 140 136 136 134*  K 4.9 4.5 4.7 5.0 4.9  CL 113* 113* 113* 111 111  CO2 18* 16* 17* 16* 13*  GLUCOSE 139* 86 78 112* 128*  BUN 56* 49* 42* 48* 52*  CREATININE 4.34* 3.71* 3.66* 4.48* 4.66*  CALCIUM 8.4* 8.1* 8.0* 7.8* 7.6*  MG  --  1.6* 1.9 1.9  --   PHOS  --  4.0 3.8 4.4 3.9   GFR: Estimated Creatinine Clearance: 10.8 mL/min (A) (by  C-G formula based on SCr of 4.66 mg/dL (H)). Liver Function Tests: Recent Labs  Lab 02/06/19 1335 02/09/19 0719  AST 15 23  ALT 19 24  ALKPHOS 88 78  BILITOT 0.2* 0.4  PROT 5.9* 5.1*  ALBUMIN 3.0* 2.5*   No results for input(s): LIPASE, AMYLASE in the last 168 hours. No results for input(s): AMMONIA in the last 168 hours. Coagulation Profile: No results for input(s): INR, PROTIME in the last 168 hours. Cardiac Enzymes: No results for input(s): CKTOTAL, CKMB, CKMBINDEX, TROPONINI in the last 168 hours. BNP (last 3 results) No results for input(s): PROBNP in the last 8760 hours. HbA1C: No results for input(s): HGBA1C in the last 72 hours. CBG: Recent Labs  Lab 02/10/19 0056 02/10/19 0321 02/10/19 0817 02/10/19 1144 02/10/19 1321  GLUCAP 153* 133* 103* 227* 222*   Lipid Profile: No results for input(s): CHOL, HDL, LDLCALC, TRIG, CHOLHDL, LDLDIRECT in the last 72 hours. Thyroid Function Tests: Recent Labs    02/09/19 0719  TSH 1.044   Anemia  Panel: Recent Labs    02/09/19 0719  FERRITIN 101  TIBC 175*  IRON 20*   Sepsis Labs: Recent Labs  Lab 02/07/19 1059 02/07/19 1434 02/10/19 1033  LATICACIDVEN 0.8 0.7 1.0    Recent Results (from the past 240 hour(s))  SARS CORONAVIRUS 2 (TAT 6-24 HRS) Nasopharyngeal Nasopharyngeal Swab     Status: Abnormal   Collection Time: 02/06/19  3:17 PM   Specimen: Nasopharyngeal Swab  Result Value Ref Range Status   SARS Coronavirus 2 POSITIVE (A) NEGATIVE Final    Comment: RESULT CALLED TO, READ BACK BY AND VERIFIED WITH: J.ROMAS RN 2046 02/06/19 MCCORMICK K (NOTE) SARS-CoV-2 target nucleic acids are DETECTED. The SARS-CoV-2 RNA is generally detectable in upper and lower respiratory specimens during the acute phase of infection. Positive results are indicative of the presence of SARS-CoV-2 RNA. Clinical correlation with patient history and other diagnostic information is  necessary to determine patient infection status. Positive results do not rule out bacterial infection or co-infection with other viruses.  The expected result is Negative. Fact Sheet for Patients: SugarRoll.be Fact Sheet for Healthcare Providers: https://www.woods-mathews.com/ This test is not yet approved or cleared by the [place] FDA and  has been authorized for detection and/or diagnosis of SARS-CoV-2 by FDA under an Emergency Use Authorization (EUA). This EUA will remain  in effect (meaning this test can be used) for th e duration of the COVID-19 declaration under Section 564(b)(1) of the Act, 21 U.S.C. section 360bbb-3(b)(1), unless the authorization is terminated or revoked sooner. Performed at [facility], [place] [address] [place], [place] [address]   MRSA PCR Screening     Status: None   Collection Time: 02/07/19  2:48 AM   Specimen: Nasal Mucosa; Nasopharyngeal  Result Value Ref Range Status   MRSA by PCR NEGATIVE NEGATIVE Final    Comment:         The GeneXpert MRSA Assay (FDA approved for NASAL specimens only), is one component of a comprehensive MRSA colonization surveillance program. It is not intended to diagnose MRSA infection nor to guide or monitor treatment for MRSA infections. Performed at [facility], [place] [address] [place], [place] [address]          Radiology Studies: US RENAL  Result Date: 02/09/2019 CLINICAL DATA:  Acute kidney injury. EXAM: RENAL / URINARY TRACT ULTRASOUND COMPLETE COMPARISON:  Renal ultrasound dated 06/22/2018 and CT scan of the abdomen dated 06/09/2017 FINDINGS: Right  Kidney: Renal measurements: 10.9 x 5.3 x 5.7 cm = volume: 170 mL. Increased echogenicity of the renal parenchyma. No hydronephrosis or mass lesion. Left Kidney: Renal measurements: 11.1 x 6.0 x 5.8 cm = volume: 204 mL. Increased echogenicity of the renal parenchyma. No hydronephrosis or mass lesion. Bladder: Appears normal for degree of bladder distention. Other: None. IMPRESSION: 1. No significant change in the appearance of the kidneys since the prior study. 2. Diffuse increased echogenicity of the otherwise normal appearing renal parenchyma bilaterally, consistent with renal medical disease. Electronically Signed   By: Lorriane Shire M.D.   On: 02/09/2019 15:58   ECHOCARDIOGRAM LIMITED  Result Date: 02/08/2019   ECHOCARDIOGRAM LIMITED REPORT   Patient Name:   Julie Brewer Date of Exam: 02/08/2019 Medical Rec #:  [id number]             Height:       63.0 in Accession #:    [id number]            Weight:       143.3 lb Date of Birth:  03/20/1950              BSA:          1.68 m Patient Age:    89 years              BP:           152/64 mmHg Patient Gender: F                     HR:           86 bpm. Exam Location:  Inpatient  Procedure: Limited Color Doppler, Cardiac Doppler and Limited Echo Indications:    syncope 780.2  History:        Patient has prior history of Echocardiogram examinations, most                  recent 06/21/2018.  Sonographer:    Johny Chess Referring Phys: [id number] [place]  1. Left ventricular ejection fraction, by visual estimation, is 65 to 70%. The left ventricle has hyperdynamic function. There is mildly increased left ventricular wall thickness  2. Left ventricular diastolic parameters are indeterminate  3. Global right ventricle has normal systolc function.The right ventricular size is normal.  4. The mitral valve is normal in structure. No evidence of mitral valve regurgitation.  5. The tricuspid valve was normal in structure. Tricuspid valve regurgitation is trivial.  6. The aortic valve is tricuspid. Aortic valve regurgitation is not visualized. Mild aortic valve sclerosis is present, with no evidence of aortic valve stenosis.  7. The inferior vena cava is normal in size with greater than 50% respiratory variability, suggesting right atrial pressure of 3 mmHg.  8. TR signal is inadequate for assessing pulmonary artery systolic pressure. FINDINGS  Left Ventricle: Left ventricular ejection fraction, by visual estimation, is 65 to 70%. The left ventricle has hyperdynamic function. The left ventricle has no regional wall motion abnormalities. There is mildly increased left ventricular wall thickness. Left ventricular diastolic parameters are indeterminate. Right Ventricle: The right ventricular size is normal. No increase in right ventricular wall thickness. Global RV systolic function is has normal systolic function. Left Atrium: Left atrial size was not assessed. Right Atrium: Right atrial size was not assessed. Right atrial pressure is estimated at 3 mmHg. Pericardium: There is no evidence of pericardial effusion is seen. There is no evidence of pericardial effusion.  Mitral Valve: The mitral valve is normal in structure. MV Area by PHT, 3.48 cm. MV PHT, 63.22 msec. No evidence of mitral valve regurgitation. Tricuspid Valve: The tricuspid valve is normal in structure.  Tricuspid valve regurgitation is trivial. Aortic Valve: The aortic valve is tricuspid. Aortic valve regurgitation is not visualized. Mild aortic valve sclerosis is present, with no evidence of aortic valve stenosis. Pulmonic Valve: The pulmonic valve was not well visualized. Pulmonic valve regurgitation is not visualized by color flow Doppler. Pulmonic regurgitation is not visualized by color flow Doppler. Venous: The inferior vena cava is normal in size with greater than 50% respiratory variability, suggesting right atrial pressure of 3 mmHg. Shunts: The interatrial septum was not well visualized.  LEFT VENTRICLE          Normals PLAX 2D LVIDd:         4.40 cm  3.6 cm   Diastology                 Normals LVIDs:         3.20 cm  1.7 cm   LV e' lateral:   7.83 cm/s 6.42 cm/s LV PW:         1.00 cm  1.4 cm   LV E/e' lateral: 10.9      15.4 LV IVS:        0.90 cm  1.3 cm   LV e' medial:    6.85 cm/s 6.96 cm/s LVOT diam:     1.80 cm  2.0 cm   LV E/e' medial:  12.5      6.96 LV SV:         47 ml    79 ml LV SV Index:   27.28    45 ml/m2 LVOT Area:     2.54 cm 3.14 cm2  LEFT ATRIUM         Index LA diam:    3.20 cm 1.91 cm/m  AORTIC VALVE             Normals LVOT Vmax:   119.00 cm/s LVOT Vmean:  76.900 cm/s 75 cm/s LVOT VTI:    0.215 m     25.3 cm  AORTA                 Normals Ao Root diam: 3.10 cm 31 mm MITRAL VALVE              Normals MV Area (PHT): 3.48 cm              SHUNTS MV PHT:        63.22 msec 55 ms      Systemic VTI:  0.22 m MV Decel Time: 218 msec   187 ms     Systemic Diam: 1.80 cm MV E velocity: 85.70 cm/s  103 cm/s MV A velocity: 122.00 cm/s 70.3 cm/s MV E/A ratio:  0.70        1.5  Oswaldo Milian MD Electronically signed by Oswaldo Milian MD Signature Date/Time: 02/08/2019/8:53:50 PMThe mitral valve is normal in structure.    Final         Scheduled Meds: . amLODipine  10 mg Oral Daily  . vitamin C  500 mg Oral Daily  . aspirin  325 mg Oral Daily  . atorvastatin  20 mg Oral  q1800  . cilostazol  100 mg Oral BID  . darbepoetin (ARANESP) injection - NON-DIALYSIS  40 mcg Subcutaneous Q Mon-1800  . dexamethasone (DECADRON) injection  6  mg Intravenous Q24H  . gabapentin  100 mg Oral BID  . heparin  5,000 Units Subcutaneous Q8H  . insulin aspart  0-9 Units Subcutaneous Q4H  . insulin detemir  0.075 Units/kg Subcutaneous BID  . Ipratropium-Albuterol  1 puff Inhalation Q6H  . pantoprazole  40 mg Oral Daily  . zinc sulfate  220 mg Oral Daily   Continuous Infusions: . [START ON 02/11/2019] remdesivir 100 mg in NS 100 mL    . sodium bicarbonate 150 mEq in dextrose 5% 1000 mL 150 mEq (02/10/19 1159)     LOS: 3 days    Time spent: 33 minutes with over 50% of the time coordinating the patient's care    Harold Hedge, DO [facility] Pager [phone]  If 7PM-7AM, please contact night-coverage [email or web address] Password [facility] 02/10/2019, 1:59 PM

## 2019-02-10 NOTE — Progress Notes (Signed)
ILR remote 

## 2019-02-10 NOTE — Progress Notes (Signed)
Pt CBG 408. Treated pt with 9 units of sliding scale. Informed pt that we will need to get a blood draw. Pt refused. Placed call to MD. Awaiting call back.

## 2019-02-10 NOTE — Progress Notes (Addendum)
[facility] Progress Note   Subjective:    Julie Brewer is a 69 yo F w/ PMH of blindness, CKD4, T2DM, CVA, HTN, PVD admit for COVID pneumonia on hospital day 3  Julie Brewer was examined and evaluated at bedside chair this am. She mentions overnight having febrile episodes with tachycardia and dyspnea. No UOP recorded - BUN and crt only slightly worse-  Bicarb down to 13 - lactate 1.0  Objective Vitals:   02/09/19 2344 02/09/19 2346 02/10/19 0000 02/10/19 0623  BP:   (!) 172/58 134/63  Pulse:  (!) 109 (!) 111 (!) 107  Resp:  (!) 24 (!) 25 20  Temp: (!) 103 F (39.4 C) (!) 101.2 F (38.4 C) 97.7 F (36.5 C) (!) 101.8 F (38.8 C)  TempSrc: Oral Oral Oral Oral  SpO2:  90% 91% 92%  Weight:      Height:       Physical Exam General: NAD, Well-appearing Heart: Tachycardic, regular rhythm, no m/r/g Lungs: CTAB, no rales no wheezes Abdomen: BS+, NTND, soft Extremities: Peripheral pulses present, no peripheral edema Dialysis Access:  N/A  Additional Objective Labs: Basic Metabolic Panel: Recent Labs  Lab 02/08/19 0250 02/09/19 0719 02/10/19 0446  NA 136 136 134*  K 4.7 5.0 4.9  CL 113* 111 111  CO2 17* 16* 13*  GLUCOSE 78 112* 128*  BUN 42* 48* 52*  CREATININE 3.66* 4.48* 4.66*  CALCIUM 8.0* 7.8* 7.6*  PHOS 3.8 4.4 3.9   Liver Function Tests: Recent Labs  Lab 02/06/19 1335 02/09/19 0719  AST 15 23  ALT 19 24  ALKPHOS 88 78  BILITOT 0.2* 0.4  PROT 5.9* 5.1*  ALBUMIN 3.0* 2.5*   No results for input(s): LIPASE, AMYLASE in the last 168 hours. CBC: Recent Labs  Lab 02/06/19 1335 02/07/19 0425 02/08/19 0250 02/09/19 0719 02/10/19 0446  WBC 6.9 6.5 5.0 5.1 5.7  NEUTROABS 5.0  --   --   --   --   HGB 9.2* 9.2* 9.0* 8.9* 9.0*  HCT 30.1* 29.7* 28.0* 28.8* 28.7*  MCV 84.6 82.5 79.8* 82.5 81.1  PLT 224 228 91* 196 188   Blood Culture    Component Value Date/Time   SDES BLOOD LEFT ANTECUBITAL 06/08/2017 1848   SPECREQUEST  06/08/2017  1848    BOTTLES DRAWN AEROBIC AND ANAEROBIC Blood Culture adequate volume   CULT  06/08/2017 1848    NO GROWTH 5 DAYS Performed at [facility] [address], [place], [place] [address]    REPTSTATUS 06/13/2017 FINAL 06/08/2017 1848    Cardiac Enzymes: No results for input(s): CKTOTAL, CKMB, CKMBINDEX, TROPONINI in the last 168 hours. CBG: Recent Labs  Lab 02/09/19 2007 02/09/19 2343 02/10/19 0056 02/10/19 0321 02/10/19 0817  GLUCAP 193* 166* 153* 133* 103*   Iron Studies:  Recent Labs    02/09/19 0719  IRON 20*  TIBC 175*  FERRITIN 101   @lablastinr3 @ Studies/Results: US RENAL  Result Date: 02/09/2019 CLINICAL DATA:  Acute kidney injury. EXAM: RENAL / URINARY TRACT ULTRASOUND COMPLETE COMPARISON:  Renal ultrasound dated 06/22/2018 and CT scan of the abdomen dated 06/09/2017 FINDINGS: Right Kidney: Renal measurements: 10.9 x 5.3 x 5.7 cm = volume: 170 mL. Increased echogenicity of the renal parenchyma. No hydronephrosis or mass lesion. Left Kidney: Renal measurements: 11.1 x 6.0 x 5.8 cm = volume: 204 mL. Increased echogenicity of the renal parenchyma. No hydronephrosis or mass lesion. Bladder: Appears normal for degree of bladder distention. Other: None. IMPRESSION: 1. No significant  change in the appearance of the kidneys since the prior study. 2. Diffuse increased echogenicity of the otherwise normal appearing renal parenchyma bilaterally, consistent with renal medical disease. Electronically Signed   By: Lorriane Shire M.D.   On: 02/09/2019 15:58   VAS US CAROTID  Result Date: 02/08/2019 Carotid Arterial Duplex Study Indications: Syncope. Limitations  Today's exam was limited due to the high bifurcation of the carotid              and suboptimal tissue properties. Performing Technologist: Antonieta Pert RDMS, RVT  Examination Guidelines: A complete evaluation includes B-mode imaging, spectral Doppler, color Doppler, and power Doppler as needed of all accessible  portions of each vessel. Bilateral testing is considered an integral part of a complete examination. Limited examinations for reoccurring indications may be performed as noted.  Right Carotid Findings: +----------+--------+--------+--------+-------------------------------+--------+           PSV cm/sEDV cm/sStenosisPlaque Description             Comments +----------+--------+--------+--------+-------------------------------+--------+ CCA Prox  63      13                                                      +----------+--------+--------+--------+-------------------------------+--------+ CCA Mid   100     19              focal, hyperechoic and calcific         +----------+--------+--------+--------+-------------------------------+--------+ CCA Distal111     28                                                      +----------+--------+--------+--------+-------------------------------+--------+ ICA Prox  104     27                                                      +----------+--------+--------+--------+-------------------------------+--------+ ICA Mid   96      24                                                      +----------+--------+--------+--------+-------------------------------+--------+ ICA Distal72      24                                                      +----------+--------+--------+--------+-------------------------------+--------+ ECA       168     18                                                      +----------+--------+--------+--------+-------------------------------+--------+ +----------+--------+-------+--------+-------------------+  PSV cm/sEDV cmsDescribeArm Pressure (mmHG) +----------+--------+-------+--------+-------------------+ [id number]                                        +----------+--------+-------+--------+-------------------+ +---------+--------+--+--------+--+---------+ VertebralPSV cm/s86EDV  cm/s17Antegrade +---------+--------+--+--------+--+---------+ Tissue shadowing near bifrucation bilaterally. Left Carotid Findings: +----------+--------+--------+--------+------------------------+--------+           PSV cm/sEDV cm/sStenosisPlaque Description      Comments +----------+--------+--------+--------+------------------------+--------+ CCA Prox  94      21                                               +----------+--------+--------+--------+------------------------+--------+ CCA Mid                           focal and hyperechoic            +----------+--------+--------+--------+------------------------+--------+ CCA Distal94      22              diffuse and heterogenous         +----------+--------+--------+--------+------------------------+--------+ ICA Prox  110     23                                               +----------+--------+--------+--------+------------------------+--------+ ICA Mid   111     31                                               +----------+--------+--------+--------+------------------------+--------+ ICA Distal114     26                                               +----------+--------+--------+--------+------------------------+--------+ ECA       133     21                                               +----------+--------+--------+--------+------------------------+--------+ +----------+--------+--------+--------+-------------------+           PSV cm/sEDV cm/sDescribeArm Pressure (mmHG) +----------+--------+--------+--------+-------------------+ [id number]                                         +----------+--------+--------+--------+-------------------+ +---------+--------+--+--------+--+---------+ VertebralPSV cm/s69EDV cm/s21Antegrade +---------+--------+--+--------+--+---------+ Tissue shadowing near bifrucation bilaterally. Summary: Right Carotid: Velocities in the right ICA are consistent with  a 1-39% stenosis. Left Carotid: Velocities in the left ICA are consistent with a 1-39% stenosis.               Multiple nodules noted in left thyroid. Vertebrals:  Bilateral vertebral arteries demonstrate antegrade flow. Subclavians: Normal flow hemodynamics were seen in bilateral subclavian              arteries. *See table(s) above for measurements and observations.  Electronically  signed by Harold Barban MD on 02/08/2019 at 6:45:25 PM.    Final    ECHOCARDIOGRAM LIMITED  Result Date: 02/08/2019   ECHOCARDIOGRAM LIMITED REPORT   Patient Name:   Julie Brewer Date of Exam: 02/08/2019 Medical Rec #:  [id number]             Height:       63.0 in Accession #:    [id number]            Weight:       143.3 lb Date of Birth:  September 06, 1950              BSA:          1.68 m Patient Age:    77 years              BP:           152/64 mmHg Patient Gender: F                     HR:           86 bpm. Exam Location:  Inpatient  Procedure: Limited Color Doppler, Cardiac Doppler and Limited Echo Indications:    syncope 780.2  History:        Patient has prior history of Echocardiogram examinations, most                 recent 06/21/2018.  Sonographer:    Johny Chess Referring Phys: [id number] [place]  1. Left ventricular ejection fraction, by visual estimation, is 65 to 70%. The left ventricle has hyperdynamic function. There is mildly increased left ventricular wall thickness  2. Left ventricular diastolic parameters are indeterminate  3. Global right ventricle has normal systolc function.The right ventricular size is normal.  4. The mitral valve is normal in structure. No evidence of mitral valve regurgitation.  5. The tricuspid valve was normal in structure. Tricuspid valve regurgitation is trivial.  6. The aortic valve is tricuspid. Aortic valve regurgitation is not visualized. Mild aortic valve sclerosis is present, with no evidence of aortic valve stenosis.  7. The inferior vena cava is normal in  size with greater than 50% respiratory variability, suggesting right atrial pressure of 3 mmHg.  8. TR signal is inadequate for assessing pulmonary artery systolic pressure. FINDINGS  Left Ventricle: Left ventricular ejection fraction, by visual estimation, is 65 to 70%. The left ventricle has hyperdynamic function. The left ventricle has no regional wall motion abnormalities. There is mildly increased left ventricular wall thickness. Left ventricular diastolic parameters are indeterminate. Right Ventricle: The right ventricular size is normal. No increase in right ventricular wall thickness. Global RV systolic function is has normal systolic function. Left Atrium: Left atrial size was not assessed. Right Atrium: Right atrial size was not assessed. Right atrial pressure is estimated at 3 mmHg. Pericardium: There is no evidence of pericardial effusion is seen. There is no evidence of pericardial effusion. Mitral Valve: The mitral valve is normal in structure. MV Area by PHT, 3.48 cm. MV PHT, 63.22 msec. No evidence of mitral valve regurgitation. Tricuspid Valve: The tricuspid valve is normal in structure. Tricuspid valve regurgitation is trivial. Aortic Valve: The aortic valve is tricuspid. Aortic valve regurgitation is not visualized. Mild aortic valve sclerosis is present, with no evidence of aortic valve stenosis. Pulmonic Valve: The pulmonic valve was not well visualized. Pulmonic valve regurgitation is not visualized by color flow Doppler. Pulmonic regurgitation is not visualized by  color flow Doppler. Venous: The inferior vena cava is normal in size with greater than 50% respiratory variability, suggesting right atrial pressure of 3 mmHg. Shunts: The interatrial septum was not well visualized.  LEFT VENTRICLE          Normals PLAX 2D LVIDd:         4.40 cm  3.6 cm   Diastology                 Normals LVIDs:         3.20 cm  1.7 cm   LV e' lateral:   7.83 cm/s 6.42 cm/s LV PW:         1.00 cm  1.4 cm   LV E/e'  lateral: 10.9      15.4 LV IVS:        0.90 cm  1.3 cm   LV e' medial:    6.85 cm/s 6.96 cm/s LVOT diam:     1.80 cm  2.0 cm   LV E/e' medial:  12.5      6.96 LV SV:         47 ml    79 ml LV SV Index:   27.28    45 ml/m2 LVOT Area:     2.54 cm 3.14 cm2  LEFT ATRIUM         Index LA diam:    3.20 cm 1.91 cm/m  AORTIC VALVE             Normals LVOT Vmax:   119.00 cm/s LVOT Vmean:  76.900 cm/s 75 cm/s LVOT VTI:    0.215 m     25.3 cm  AORTA                 Normals Ao Root diam: 3.10 cm 31 mm MITRAL VALVE              Normals MV Area (PHT): 3.48 cm              SHUNTS MV PHT:        63.22 msec 55 ms      Systemic VTI:  0.22 m MV Decel Time: 218 msec   187 ms     Systemic Diam: 1.80 cm MV E velocity: 85.70 cm/s  103 cm/s MV A velocity: 122.00 cm/s 70.3 cm/s MV E/A ratio:  0.70        1.5  Oswaldo Milian MD Electronically signed by Oswaldo Milian MD Signature Date/Time: 02/08/2019/8:53:50 PMThe mitral valve is normal in structure.    Final    Medications: . remdesivir 200 mg in sodium chloride 0.9% 250 mL IVPB     Followed by  . [START ON 02/11/2019] remdesivir 100 mg in NS 100 mL     . amLODipine  10 mg Oral Daily  . vitamin C  500 mg Oral Daily  . aspirin  325 mg Oral Daily  . atorvastatin  20 mg Oral q1800  . cilostazol  100 mg Oral BID  . darbepoetin (ARANESP) injection - NON-DIALYSIS  40 mcg Subcutaneous Q Mon-1800  . dexamethasone (DECADRON) injection  6 mg Intravenous Q24H  . gabapentin  100 mg Oral BID  . heparin  5,000 Units Subcutaneous Q8H  . insulin glargine  8 Units Subcutaneous Daily  . Ipratropium-Albuterol  1 puff Inhalation Q6H  . pantoprazole  40 mg Oral Daily  . zinc sulfate  220 mg Oral Daily    Dialysis Orders:  Assessment/Plan: Assessment/Plan: 1.AKI on CKD:  Received 1L fluids overnight w/o significant change. Had worsening COVID pneumonia symptoms w/ fevers, tachycardia, increasing oxygen requirement. Volume status appear similar to yesterday. Worsening renal  fx w/ no urine output recorded. Likely due to hemodynamic instability. 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 Renal US w/ increased echogenicity w/o hydronephrosis. AM labs: K 4.9, Bicarb 13, Bun 52, Creatinine 4.66, Phos 3.9 - Start Bicarb infusion 164mEq in D5 75cc/hr - Trend renal fx - Repeat UA/UrNa/UrCreat - Supportive care w/ treatment of COVID pneumonia - Avoid nephrotoxic meds   There are no current indications for HD- still hoping this will turn around but if really no UOP could become an issue  2. COVID pneumonia: Temp peak of 101.8 overnight with now 2L oxygen requirement. Started on dexamethasone and remdesivir per primary.  3. Recurrent Syncope: Orthostatic vs hypoglycemic episodes. Recurrent issue with extensive work-up in the past. Echo w/o significant findings this admission. May need to f/u with endo outpatient for insulin dosing to prevent hypoglycemia.  3 Hypertension/volume  - BP 134/63 this am.  4. Anemia  - Stable at baseline. hgb 8.9. Borderline microcytic. Iron sat 11%, Ferritin 101 (would expect higher in COVID infeciton). Will hold off on Iron infusion at the moment due to worsening infection but will need iron supplementation at discharge - C/w Weekly Aranesp - Trend cbc  Gilberto Better, MD PGY-2, [place] Pager: [phone]  Attending attestation to follow  Patient seen and examined, agree with above note with above modifications. Had fever and reportedly getting more symptomatic from her COVID overnight.  Renal function a little worse as well with worsening metabolic acidosis.  Have started IV bicarb and will continue to follow-  No indications for dialysis at present.   Corliss Parish, MD 02/10/2019

## 2019-02-10 NOTE — Progress Notes (Signed)
Inpatient Diabetes Program Recommendations  AACE/ADA: New Consensus Statement on Inpatient Glycemic Control   Target Ranges:  Prepandial:   less than 140 mg/dL      Peak postprandial:   less than 180 mg/dL (1-2 hours)      Critically ill patients:  140 - 180 mg/dL   Results for Julie Brewer, Julie Brewer (MRN [id number]) as of 02/10/2019 08:55  Ref. Range 02/09/2019 08:18 02/09/2019 11:54 02/09/2019 20:07 02/09/2019 23:43 02/10/2019 00:56 02/10/2019 03:21 02/10/2019 08:17  Glucose-Capillary Latest Ref Range: 70 - 99 mg/dL 104 (H) 126 (H) 193 (H) 166 (H) 153 (H) 133 (H) 103 (H)   Review of Glycemic Control  Diabetes history: DM2 Outpatient Diabetes medications: Lantus 14 units daily, Regular 12 units with breakfast, Regular 5-7 units with supper Current orders for Inpatient glycemic control: Lantus 8 units daily; Decadron 6 mg Q24H  Inpatient Diabetes Program Recommendations:   Correction (SSI): While inpatient, please consider ordering CBGs AC&HS, Novolog 0-9 units TID with meals and Novolog 0-5 units QHS.  NOTE: Noted Lantus was decreased and Decadron was ordered this morning. Anticipate glucose will increase with addition of steroids. Recommend ordering Novolog correction scale AC&HS. Will continue to follow glucose trends while inpatient.  Thanks, Barnie Alderman, RN, MSN, CDE Diabetes Coordinator Inpatient Diabetes Program [phone] (Team Pager from 8am to 5pm)

## 2019-02-10 NOTE — Progress Notes (Signed)
Remdesivir per Pharmacy  69 yo female diagnosed with COVID to start remdesivir. ALT 24.   Plan:  Remdesivir 200mg  IV x 1 , then 100mg  daily x 4 Pharmacy will sign off and follow peripherally

## 2019-02-10 NOTE — Plan of Care (Signed)

## 2019-02-11 DIAGNOSIS — N189 Chronic kidney disease, unspecified: Secondary | ICD-10-CM

## 2019-02-11 DIAGNOSIS — D649 Anemia, unspecified: Secondary | ICD-10-CM

## 2019-02-11 LAB — C-REACTIVE PROTEIN: CRP: 6.5 mg/dL — ABNORMAL HIGH

## 2019-02-11 LAB — BASIC METABOLIC PANEL
Anion gap: 12 (ref 5–15)
BUN: 61 mg/dL — ABNORMAL HIGH (ref 8–23)
CO2: 18 mmol/L — ABNORMAL LOW (ref 22–32)
Calcium: 7.8 mg/dL — ABNORMAL LOW (ref 8.9–10.3)
Chloride: 102 mmol/L (ref 98–111)
Creatinine, Ser: 5.04 mg/dL — ABNORMAL HIGH (ref 0.44–1.00)
GFR calc Af Amer: 10 mL/min — ABNORMAL LOW (ref >60)
GFR calc non Af Amer: 8 mL/min — ABNORMAL LOW (ref >60)
Glucose, Bld: 298 mg/dL — ABNORMAL HIGH (ref 70–99)
Potassium: 4.8 mmol/L (ref 3.5–5.1)
Sodium: 132 mmol/L — ABNORMAL LOW (ref 135–145)

## 2019-02-11 LAB — CBC
HCT: 28.7 % — ABNORMAL LOW (ref 36.0–46.0)
Hemoglobin: 9.1 g/dL — ABNORMAL LOW (ref 12.0–15.0)
MCH: 25.3 pg — ABNORMAL LOW (ref 26.0–34.0)
MCHC: 31.7 g/dL (ref 30.0–36.0)
MCV: 79.9 fL — ABNORMAL LOW (ref 80.0–100.0)
Platelets: 199 10*3/uL (ref 150–400)
RBC: 3.59 MIL/uL — ABNORMAL LOW (ref 3.87–5.11)
RDW: 15.3 % (ref 11.5–15.5)
WBC: 4.6 10*3/uL (ref 4.0–10.5)
nRBC: 0 % (ref 0.0–0.2)

## 2019-02-11 LAB — FERRITIN: Ferritin: 346 ng/mL — ABNORMAL HIGH (ref 11–307)

## 2019-02-11 LAB — GLUCOSE, CAPILLARY
Glucose-Capillary: 260 mg/dL — ABNORMAL HIGH (ref 70–99)
Glucose-Capillary: 282 mg/dL — ABNORMAL HIGH (ref 70–99)
Glucose-Capillary: 302 mg/dL — ABNORMAL HIGH (ref 70–99)
Glucose-Capillary: 357 mg/dL — ABNORMAL HIGH (ref 70–99)
Glucose-Capillary: 413 mg/dL — ABNORMAL HIGH (ref 70–99)
Glucose-Capillary: 458 mg/dL — ABNORMAL HIGH (ref 70–99)
Glucose-Capillary: 505 mg/dL (ref 70–99)

## 2019-02-11 LAB — D-DIMER, QUANTITATIVE: D-Dimer, Quant: 1.69 ug/mL-FEU — ABNORMAL HIGH (ref 0.00–0.50)

## 2019-02-11 LAB — HEPATIC FUNCTION PANEL
ALT: 35 U/L (ref 0–44)
AST: 32 U/L (ref 15–41)
Albumin: 2.3 g/dL — ABNORMAL LOW (ref 3.5–5.0)
Alkaline Phosphatase: 74 U/L (ref 38–126)
Bilirubin, Direct: <0.1 mg/dL (ref 0.0–0.2)
Total Bilirubin: 0.4 mg/dL (ref 0.3–1.2)
Total Protein: 5.5 g/dL — ABNORMAL LOW (ref 6.5–8.1)

## 2019-02-11 LAB — PHOSPHORUS: Phosphorus: 4.1 mg/dL (ref 2.5–4.6)

## 2019-02-11 LAB — MAGNESIUM: Magnesium: 1.8 mg/dL (ref 1.7–2.4)

## 2019-02-11 MED ORDER — ALBUTEROL SULFATE HFA 108 (90 BASE) MCG/ACT IN AERS
1.0000 | INHALATION_SPRAY | RESPIRATORY_TRACT | Status: DC | PRN
  Filled 2019-02-11: qty 6.7

## 2019-02-11 MED ORDER — INSULIN ASPART 100 UNIT/ML ~~LOC~~ SOLN
7.0000 [IU] | Freq: Once | SUBCUTANEOUS | Status: AC
  Administered 2019-02-11: 7 [IU] via SUBCUTANEOUS

## 2019-02-11 MED ORDER — INSULIN ASPART 100 UNIT/ML ~~LOC~~ SOLN
10.0000 [IU] | Freq: Once | SUBCUTANEOUS | Status: AC
  Administered 2019-02-11: 10 [IU] via SUBCUTANEOUS

## 2019-02-11 MED ORDER — SODIUM CHLORIDE 0.9 % IV SOLN
510.0000 mg | Freq: Once | INTRAVENOUS | Status: AC
  Administered 2019-02-11: 19:00:00 510 mg via INTRAVENOUS
  Filled 2019-02-11: qty 17

## 2019-02-11 MED ORDER — IPRATROPIUM-ALBUTEROL 20-100 MCG/ACT IN AERS
1.0000 | INHALATION_SPRAY | Freq: Three times a day (TID) | RESPIRATORY_TRACT | Status: DC
  Administered 2019-02-11 – 2019-02-16 (×17): 1 via RESPIRATORY_TRACT
  Filled 2019-02-11: qty 4

## 2019-02-11 MED ORDER — FUROSEMIDE 40 MG PO TABS
40.0000 mg | ORAL_TABLET | Freq: Every day | ORAL | Status: DC
  Administered 2019-02-12 – 2019-02-14 (×3): 40 mg via ORAL
  Filled 2019-02-11 (×3): qty 1

## 2019-02-11 MED ORDER — INSULIN ASPART 100 UNIT/ML ~~LOC~~ SOLN
5.0000 [IU] | Freq: Three times a day (TID) | SUBCUTANEOUS | Status: DC
  Administered 2019-02-11 – 2019-02-12 (×2): 5 [IU] via SUBCUTANEOUS

## 2019-02-11 MED ORDER — FUROSEMIDE 10 MG/ML IJ SOLN
40.0000 mg | Freq: Two times a day (BID) | INTRAMUSCULAR | Status: DC
  Administered 2019-02-11: 40 mg via INTRAVENOUS
  Filled 2019-02-11: qty 4

## 2019-02-11 MED ORDER — INSULIN ASPART 100 UNIT/ML ~~LOC~~ SOLN
0.0000 [IU] | SUBCUTANEOUS | Status: DC
  Administered 2019-02-11 (×2): 15 [IU] via SUBCUTANEOUS
  Administered 2019-02-12: 8 [IU] via SUBCUTANEOUS
  Administered 2019-02-12: 11 [IU] via SUBCUTANEOUS
  Administered 2019-02-12: 2 [IU] via SUBCUTANEOUS
  Administered 2019-02-12: 11 [IU] via SUBCUTANEOUS
  Administered 2019-02-12: 3 [IU] via SUBCUTANEOUS
  Administered 2019-02-13: 5 [IU] via SUBCUTANEOUS
  Administered 2019-02-13: 3 [IU] via SUBCUTANEOUS
  Administered 2019-02-13 (×2): 5 [IU] via SUBCUTANEOUS
  Administered 2019-02-13: 3 [IU] via SUBCUTANEOUS
  Administered 2019-02-14: 2 [IU] via SUBCUTANEOUS
  Administered 2019-02-14 (×2): 3 [IU] via SUBCUTANEOUS
  Administered 2019-02-14 (×2): 5 [IU] via SUBCUTANEOUS
  Administered 2019-02-15 (×3): 3 [IU] via SUBCUTANEOUS
  Administered 2019-02-16: 5 [IU] via SUBCUTANEOUS
  Administered 2019-02-16: 2 [IU] via SUBCUTANEOUS

## 2019-02-11 NOTE — Progress Notes (Addendum)
[facility] Progress Note   Subjective:    Julie Brewer is a 69 yo F w/ PMH of blindness, CKD4, T2DM, CVA, HTN, PVD admit for COVID pneumonia on hospital day 3  Julie Brewer was examined and evaluated at bedside chair this am. She was observed resting comfortably. She mentions that she is doing well and is making urine. She mentions she had no acute events overnight. Denies any chest pain, palpitations, dyspnea. She mentions noticing her legs seem slightly swollen.  She looks really good - on room air-     Objective Vitals:   02/10/19 2001 02/11/19 0000 02/11/19 0409 02/11/19 0527  BP: (!) 142/60 138/60 (!) 127/59   Pulse: 93 82 78   Resp: 16 16 15    Temp: 98.1 F (36.7 C) 97.8 F (36.6 C) 98.1 F (36.7 C)   TempSrc: Oral Oral Oral   SpO2: 95% 100% 95%   Weight:    72.1 kg  Height:       Physical Exam General: NAD, Well-appearing Heart: Tachycardic, regular rhythm, no m/r/g Lungs: CTAB, bibasilar rales, no wheezes Abdomen: BS+, NTND, soft Extremities: Peripheral pulses present, 1+ pitting ankle edema Dialysis Access:  N/A  Additional Objective Labs: Basic Metabolic Panel: Recent Labs  Lab 02/08/19 0250 02/09/19 0719 02/10/19 0446 02/11/19 0500  NA 136 136 134* 132*  K 4.7 5.0 4.9 4.8  CL 113* 111 111 102  CO2 17* 16* 13* 18*  GLUCOSE 78 112* 128* 298*  BUN 42* 48* 52* 61*  CREATININE 3.66* 4.48* 4.66* 5.04*  CALCIUM 8.0* 7.8* 7.6* 7.8*  PHOS 3.8 4.4 3.9  --    Liver Function Tests: Recent Labs  Lab 02/06/19 1335 02/09/19 0719  AST 15 23  ALT 19 24  ALKPHOS 88 78  BILITOT 0.2* 0.4  PROT 5.9* 5.1*  ALBUMIN 3.0* 2.5*   No results for input(s): LIPASE, AMYLASE in the last 168 hours. CBC: Recent Labs  Lab 02/06/19 1335 02/07/19 0425 02/08/19 0250 02/09/19 0719 02/10/19 0446 02/11/19 0500  WBC 6.9 6.5 5.0 5.1 5.7 4.6  NEUTROABS 5.0  --   --   --   --   --   HGB 9.2* 9.2* 9.0* 8.9* 9.0* 9.1*  HCT 30.1* 29.7* 28.0* 28.8* 28.7*  28.7*  MCV 84.6 82.5 79.8* 82.5 81.1 79.9*  PLT 224 228 91* 196 188 199   Blood Culture    Component Value Date/Time   SDES BLOOD LEFT ANTECUBITAL 06/08/2017 1848   SPECREQUEST  06/08/2017 1848    BOTTLES DRAWN AEROBIC AND ANAEROBIC Blood Culture adequate volume   CULT  06/08/2017 1848    NO GROWTH 5 DAYS Performed at [facility], [place] [address], [place], [place] [address]    REPTSTATUS 06/13/2017 FINAL 06/08/2017 1848    Cardiac Enzymes: No results for input(s): CKTOTAL, CKMB, CKMBINDEX, TROPONINI in the last 168 hours. CBG: Recent Labs  Lab 02/10/19 1643 02/10/19 2027 02/11/19 0043 02/11/19 0409 02/11/19 0737  GLUCAP 350* 408* 357* 282* 260*   Iron Studies:  Recent Labs    02/09/19 0719 02/11/19 0500  IRON 20*  --   TIBC 175*  --   FERRITIN 101 346*   @lablastinr3 @ Studies/Results: US RENAL  Result Date: 02/09/2019 CLINICAL DATA:  Acute kidney injury. EXAM: RENAL / URINARY TRACT ULTRASOUND COMPLETE COMPARISON:  Renal ultrasound dated 06/22/2018 and CT scan of the abdomen dated 06/09/2017 FINDINGS: Right Kidney: Renal measurements: 10.9 x 5.3 x 5.7 cm = volume: 170 mL. Increased echogenicity of the  renal parenchyma. No hydronephrosis or mass lesion. Left Kidney: Renal measurements: 11.1 x 6.0 x 5.8 cm = volume: 204 mL. Increased echogenicity of the renal parenchyma. No hydronephrosis or mass lesion. Bladder: Appears normal for degree of bladder distention. Other: None. IMPRESSION: 1. No significant change in the appearance of the kidneys since the prior study. 2. Diffuse increased echogenicity of the otherwise normal appearing renal parenchyma bilaterally, consistent with renal medical disease. Electronically Signed   By: Lorriane Shire M.D.   On: 02/09/2019 15:58   CUP PACEART REMOTE DEVICE CHECK  Result Date: 02/10/2019 [facility] summary report received. Battery status OK. Normal device function. No new symptom episodes, tachy episodes, brady, or pause episodes.  No new AF episodes. Monthly summary reports and ROV/PRN  Medications: . remdesivir 100 mg in NS 100 mL 100 mg (02/11/19 0825)  . sodium bicarbonate 150 mEq in dextrose 5% 1000 mL 75 mL/hr at 02/11/19 0500   . amLODipine  10 mg Oral Daily  . vitamin C  500 mg Oral Daily  . aspirin  325 mg Oral Daily  . atorvastatin  20 mg Oral q1800  . cilostazol  100 mg Oral BID  . darbepoetin (ARANESP) injection - NON-DIALYSIS  40 mcg Subcutaneous Q Mon-1800  . dexamethasone (DECADRON) injection  6 mg Intravenous Q24H  . gabapentin  100 mg Oral BID  . heparin  5,000 Units Subcutaneous Q8H  . insulin aspart  0-9 Units Subcutaneous Q4H  . insulin detemir  0.075 Units/kg Subcutaneous BID  . Ipratropium-Albuterol  1 puff Inhalation Q6H  . pantoprazole  40 mg Oral Daily  . zinc sulfate  220 mg Oral Daily    Dialysis Orders:  Assessment/Plan: Assessment/Plan: 1.AKI on CKD (Baseline BUN 34, Creatinine 3) : Oxygen requirement resolved. COVID symptoms resolving. Appear more hypervolemic this am. slight worsening of renal fx but non oliguric.  FENa 1.3% consistent w/ intrinsic renal disease. May be having progression of chronic renal disease after acute illness w/ COVID. AM labs: K 4.8, Bicarb 18, Bun 61, Creatinine 5.04, Phos 4.1 - Start furosemide 40mg  IV BID- - will quickly transition to oral home dose - 40 mg tomorrow - Trend renal fx - Supportive care w/ treatment of COVID pneumonia - Avoid nephrotoxic meds   Clinically looks good, does not seem uremic in the least- even though renal function trending worse there are no indications for HD.  Volume up-  Restarted lasix- hope to see plateau and trend down soon    2. COVID pneumonia: Afebrile overnight. Currently on Room Air. On dexamethasone and remdesivir per primary.  3. Recurrent Syncope: Orthostatic vs hypoglycemic episodes. Recurrent issue with extensive work-up in the past. Echo w/o significant findings this admission. May need to f/u with endo  outpatient for insulin dosing to prevent hypoglycemia.  4. Hypertension/volume  - BP 127/59 this am.  5. Microcytic Anemia  - Stable at baseline. hgb 9.1. MCV 79.9 Iron sat 11%, Ferritin 101 (would expect higher in COVID infeciton). Will hold off on Iron infusion at the moment due to worsening infection but will need iron supplementation at discharge-  Actually I think I am comfortable with iron repletion - will give feraheme - C/w Weekly Aranesp - Trend cbc   Gilberto Better, MD PGY-2, [facility] Pager: [phone]  Attending attestation to follow  Patient seen and examined, agree with above note with above modifications. BF with A on CRF in the setting of covid. Is non oliguric and not uremic.  No indications for  HD-  Cont supportive care.  Resume lasix-  Treating anemia.  Follows with Johnney Ou as OP  Corliss Parish, MD 02/11/2019

## 2019-02-11 NOTE — Progress Notes (Signed)
PROGRESS NOTE    Julie Brewer  TJQ:[id number] DOB: 04-19-1950 DOA: 02/06/2019 PCP: [name], MD   Brief Narrative:  The patient is a 69 year old female with past medical history of hypertension, CKD 4 with proteinuria, type 2 diabetes and follows with endocrinology, tobacco use, hypoglycemic episodes noted in the past, peripheral vascular disease, multiple syncopal episodes in the past with loop recorder placed 06/20/2016 without evidence of arrhythmia and follows with cardiology, who presented to the ED on 1/8 for syncopal episode of unclear etiology.  She was found to be COVID-19 positive on admission.  Also with AKI which had improvement with IV fluid.  Was not started on COVID-19 directed treatment until 1/12 as below.  1/11: Worsened AKI and metabolic acidosis, nephrology consulted started on IV fluids.  1/12: Febrile overnight, requiring O2 2 L/min nasal cannula.  Started on dexamethasone and remdesivir.  AKI worsened, started on bicarb drip.  1/13: She continues to be treated with remdesivir and dexamethasone.  Fevers improved slightly today.  Also started on Combivent and albuterol as needed.  Blood sugars are significantly uncontrolled now given steroid demargination.  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 Sallyanne Kuster, interrogated 02/06/2019 - unremarkable -Carotid ultrasound unremarkable 1/10 -Limited echo unremarkable  1/10 -Now starting steroids, will start higher doses of insulin as below  AKI on CKD 4, worsened, COVID-19 GN? 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 with her slight worsening of her renal function but she is nonoliguric -Urine lites calculated to female which was 1.3 consistent with intrinsic renal disease -Nephrology feels that this may be progression of her chronic renal disease after acute illness with Covid -IV fluids been stopped and started furosemide 40 mg IV twice daily and will likely quickly transition to oral home dose 40 mg tomorrow  -Recommend continuing to trend renal function with supportive care the treatment of Covid pneumonia  -Avoid nephrotoxic medications, contrast dyes, hypotension and renally adjust medications -Continue monitor trend and repeat CMP in a.m. -Nephrology feels that she is not uremic and even though that her renal function is trending worse there is no current indication for hemodialysis  Metabolic Acidosis -Sodium sodium bicarb infusion 150 mEq in D5 75 cc/h per nephro now stopped -Patient's CO2 is now 18, chloride level is 102, and anion gap is 12  Type 2 diabetes -Follows with endocrinology, concern for hypoglycemic unawareness in the past -HA1C 7.1 on 02/07/19 -Does not check blood sugar regularly at home -Outpatient meds:Lantus 14 units daily, Regular 12 u with breakfast, Regular 5-7 u with dinner -Tolerated Lantus 8 units monotherapy well while inpatient and could consider discharging on  this regimen when  ready -Starting Decadron -> start COVID-19 hyperglycemic order set, starting with sensitive dosing given hypoglycemia and reduced renal function -CBGs have now been ranging from 260-505 -NovoLog sliding scale insulin has been increased to moderate and have added 5 units 3 times daily with meals  SIRS secondary to COVID-19 -Previously no O2 requirement and minimal symptoms therefore has not been treated -1/12 overnight: T max 103 F, tachycardic, tachypneic requiring 2 L/min O2, lactic acid negative -Initial CXR showed "Asymmetric peripheral opacities in the left upper lobe and both lower lobes, concerning for multifocal pneumonia, including potential viral pneumonia" -Checked Baseline Inflammatory Markers and Trend Daily:  Recent Labs    02/09/19 0719 02/10/19 1500 02/10/19 1800 02/11/19 0500  DDIMER  --   --  1.93* 1.69*  FERRITIN 101 201  --  346*  CRP  --  6.3*  --  6.5*    Lab Results  Component Value Date   SARSCOV2NAA POSITIVE (A) 02/06/2019   [place] NEGATIVE 06/20/2018  -Started Treatment with 5 Days of Remdesivir and 10 Days of IV Decadron; Day 2 of Resmdesivir and Day 2/10 of Steroids  -C/w Antitussives with Guaifenesin-Dextromethorphan 10 mL po q4hprn Cough and Chlorpheniramine-Hydrocodone 5 mL po q12hprn -Check Blood Cx x2 and showed NGTD <24 hours  -C/w Zinc 220 mg po Daily and Vitamin C  -Airborne and Contact Precautions -C/w Combivent Scheduled 1 puff IH TID and Albuterol 1-2 puff IH q4hprn -Add Flutter Valve and Incentive Spirometry  -Continue to monitor and trend inflammatory markers and repeat chest x-ray imaging in the a.m.   Hypomagnesemia -Improved. Mag Level was 1.8 -Continue to Monitor and Replete as Necessary -Repeat Mag Level in the AM   Hyperlipidemia  -C/w Atorvastatin 20 mg po Daily   Hypertension, difficulty to control in the past -Increased Amlodipine to 10 mg -Discontinued labetalol for concern of hypoglycemia  unawareness  PVD -Continue Cilostazol 100 mg po BID -Carotid ultrasound as above  Iron deficiency anemia -Anemia panel done recently showed an iron level of 20, U IBC of 155, TIBC 175, saturation ratios of 11%, ferritin level of 101 -Continued iron supplement and received a dose of Ferumoxytol 510 mg per Nephrology and Darbepoetin Alpha 40 mcg on Monday  -Patient's Hb/HCt went from 8.9/28.8 -> 9.0/28.7 -> 9.1/28.7 -Continue to monitor for signs and symptoms of bleeding; currently no overt bleeding noted -Repeat CBC in AM   Tobacco use with Emphysema -Smokes half pack per day -Tobacco cessation provided  Atraumatic right eyelid swelling, unknown etiology, resolved -Continue eyedrops with Liquifilm Tears 1 drop Right Eye as Needed  GOC: DNR poA  DVT prophylaxis: Heparin 5,000 units sq q8h  Code Status: DO NOT RESUSCITATE Family Communication: Discussed with Daughter Comptroller) over the Telephone Disposition Plan: Pending Improvement in Respiratory Status, Renal Fxn, and completion of Remdesivir   Consultants:   Nephrology   Procedures: None   Antimicrobials:  Anti-infectives (From admission, onward)   Start     Dose/Rate Route Frequency Ordered Stop   02/11/19 1000  remdesivir 100 mg in sodium chloride 0.9 % 100 mL IVPB     100 mg 200 mL/hr over 30 Minutes Intravenous Daily 02/10/19 0803 02/15/19 0959   02/10/19 1000  remdesivir 200 mg in sodium chloride 0.9% 250 mL IVPB     200 mg 580 mL/hr over 30 Minutes Intravenous Once 02/10/19 0803 02/10/19 1145     Subjective: Seen and examined at bedside she is sitting in the chair and denies any chest pain, lightheadedness or dizziness.  Still has some shortness of breath and is coughing up some sputum.  No lightheadedness or dizziness.  No other concerns or complaints at this time.  Objective: Vitals:   02/11/19 0000 02/11/19 0409 02/11/19 0527 02/11/19 1253  BP: 138/60 (!) 127/59  140/64  Pulse: 82 78  88  Resp: 16 15   (!) 21  Temp: 97.8 F (36.6 C) 98.1 F (36.7 C)  (!) 97.3 F (36.3 C)  TempSrc: Oral Oral  Oral  SpO2: 100% 95%  93%  Weight:   72.1 kg   Height:        Intake/Output Summary (Last 24 hours) at 02/11/2019 1746 Last data filed at 02/11/2019 1500 Gross per 24 hour  Intake 1423.77 ml  Output 1200 ml  Net 223.77 ml   Filed Weights   02/06/19 1251 02/07/19 0251 02/11/19 0527  Weight: 63.5 kg 65 kg 72.1 kg   Examination: Physical Exam:  Constitutional: WN/WD overweight African-American female currently, NAD and appears calm and comfortable 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 Grossly normal hearing. Mucous membranes are moist.  Neck: Appears normal, supple, no cervical masses, normal ROM, no appreciable thyromegaly; no JVD Respiratory: Diminished to auscultation bilaterally with coarse breath sounds, no wheezing, rales, rhonchi or crackles. Normal respiratory effort and patient is not tachypenic. No accessory muscle use and is not wearing supplemental oxygen via nasal cannula Cardiovascular: RRR, no murmurs / rubs / gallops. S1 and S2 auscultated. 1+ extremity edema.  Abdomen: Soft, non-tender, Distended due to body habitus.  Bowel sounds positive x4.  GU: Deferred. Musculoskeletal: No clubbing / cyanosis of digits/nails. No joint deformity upper and lower extremities. Skin: No rashes, lesions, ulcers on a limited skin evaluation. No induration; Warm and dry.  Neurologic: CN 2-12 grossly intact with no focal deficits. Romberg sign and cerebellar reflexes not assessed.  Psychiatric: Normal judgment and insight. Alert and oriented x 3. Pleasant mood and appropriate affect.   Data Reviewed: I have personally reviewed following labs and imaging studies  CBC: Recent Labs  Lab 02/06/19 1335 02/07/19 0425 02/08/19 0250 02/09/19 0719 02/10/19 0446 02/11/19 0500  WBC 6.9 6.5 5.0 5.1 5.7 4.6  NEUTROABS 5.0  --   --   --   --   --   HGB 9.2* 9.2* 9.0* 8.9* 9.0*  9.1*  HCT 30.1* 29.7* 28.0* 28.8* 28.7* 28.7*  MCV 84.6 82.5 79.8* 82.5 81.1 79.9*  PLT 224 228 91* 196 188 433   Basic Metabolic Panel: Recent Labs  Lab 02/07/19 0425 02/08/19 0250 02/09/19 0719 02/10/19 0446 02/11/19 0500 02/11/19 1006  NA 140 136 136 134* 132*  --   K 4.5 4.7 5.0 4.9 4.8  --   CL 113* 113* 111 111 102  --   CO2 16* 17* 16* 13* 18*  --   GLUCOSE 86 78 112* 128* 298*  --   BUN 49* 42* 48* 52* 61*  --   CREATININE 3.71* 3.66* 4.48* 4.66* 5.04*  --   CALCIUM 8.1* 8.0* 7.8* 7.6* 7.8*  --   MG 1.6* 1.9 1.9  --   --  1.8  PHOS 4.0 3.8 4.4 3.9  --  4.1   GFR: Estimated Creatinine Clearance: 10.5 mL/min (A) (by C-G formula based on SCr of 5.04 mg/dL (H)). Liver Function Tests: Recent Labs  Lab 02/06/19 1335 02/09/19 0719 02/11/19 1006  AST 15 23 32  ALT 19 24 35  ALKPHOS 88 78 74  BILITOT 0.2* 0.4 0.4  PROT 5.9* 5.1* 5.5*  ALBUMIN 3.0* 2.5* 2.3*   No results for input(s): LIPASE, AMYLASE in the last 168 hours. No results for input(s): AMMONIA in the last 168 hours. Coagulation Profile: No results for input(s): INR, PROTIME in the last 168 hours. Cardiac Enzymes: No results for input(s): CKTOTAL, CKMB, CKMBINDEX, TROPONINI in the last 168 hours. BNP (last 3 results) No results for input(s): PROBNP in the last 8760 hours. HbA1C: No results for input(s): HGBA1C in the last 72 hours. CBG: Recent Labs  Lab 02/11/19 0043 02/11/19 0409 02/11/19 0737 02/11/19 1214 02/11/19 1717  GLUCAP 357* 282* 260* 302* 505*   Lipid Profile: No results for input(s): CHOL, HDL, LDLCALC, TRIG, CHOLHDL, LDLDIRECT in the last 72 hours. Thyroid Function Tests: Recent Labs    02/09/19 0719  TSH 1.044   Anemia Panel: Recent Labs    02/09/19 0719 02/10/19 1500 02/11/19 0500  FERRITIN 101 201 346*  TIBC 175*  --   --   IRON 20*  --   --    Sepsis Labs: Recent Labs  Lab 02/07/19 1059 02/07/19 1434 02/10/19 1033 02/10/19 1500  LATICACIDVEN 0.8 0.7 1.0 1.2     Recent Results (from the past 240 hour(s))  SARS CORONAVIRUS 2 (TAT 6-24 HRS) Nasopharyngeal Nasopharyngeal Swab     Status: Abnormal   Collection Time: 02/06/19  3:17 PM   Specimen: Nasopharyngeal Swab  Result Value Ref Range Status   SARS Coronavirus 2 POSITIVE (A) NEGATIVE Final    Comment: RESULT CALLED TO, READ BACK BY AND VERIFIED WITH: J.ROMAS RN 2046 02/06/19 MCCORMICK K (NOTE) SARS-CoV-2 target nucleic acids are DETECTED. The SARS-CoV-2 RNA is generally detectable in upper and lower respiratory specimens during the acute phase of infection. Positive results are indicative of the presence of SARS-CoV-2 RNA. Clinical correlation with patient history and other diagnostic information is  necessary to determine patient infection status. Positive results do not rule out bacterial infection or co-infection with other viruses.  The expected result is Negative. Fact Sheet for Patients: SugarRoll.be Fact Sheet for Healthcare Providers: https://www.woods-mathews.com/ This test is not yet approved or cleared by the [place] FDA and  has been authorized for detection and/or diagnosis of SARS-CoV-2 by FDA under an Emergency Use Authorization (EUA). This EUA will remain  in effect (meaning this test can be used) for th e duration of the COVID-19 declaration under Section 564(b)(1) of the Act, 21 U.S.C. section 360bbb-3(b)(1), unless the authorization is terminated or revoked sooner. Performed at [facility], [place] [address], [place], [place] [address]   MRSA PCR Screening     Status: None   Collection Time: 02/07/19  2:48 AM   Specimen: Nasal Mucosa; Nasopharyngeal  Result Value Ref Range Status   MRSA by PCR NEGATIVE NEGATIVE Final    Comment:        The GeneXpert MRSA Assay (FDA approved for NASAL specimens only), is one component of a comprehensive MRSA colonization surveillance program. It is not intended to diagnose  MRSA infection nor to guide or monitor treatment for MRSA infections. Performed at [facility], [place] [address] [place],  [address]   Culture, blood (routine x 2)     Status: None (Preliminary result)   Collection Time: 02/10/19 10:33 AM   Specimen: BLOOD  Result Value Ref Range Status   Specimen Description BLOOD RIGHT ANTECUBITAL  Final   Special Requests AEROBIC BOTTLE ONLY Blood Culture adequate volume  Final   Culture   Final  NO GROWTH < 24 HOURS Performed at [place] [address] [place], [place] [address]    Report Status PENDING  Incomplete  Culture, blood (routine x 2)     Status: None (Preliminary result)   Collection Time: 02/10/19 10:33 AM   Specimen: BLOOD  Result Value Ref Range Status   Specimen Description BLOOD RIGHT ANTECUBITAL  Final   Special Requests   Final    BOTTLES DRAWN AEROBIC AND ANAEROBIC Blood Culture adequate volume   Culture   Final    NO GROWTH < 24 HOURS Performed at [facility], [place] [address], [place], [place] [address]    Report Status PENDING  Incomplete    Radiology Studies: CUP PACEART REMOTE DEVICE CHECK  Result Date: 02/10/2019 Carelink summary report received. Battery status OK. Normal device function. No new symptom episodes, tachy episodes, brady, or pause episodes. No new AF episodes. Monthly summary reports and ROV/PRN   Scheduled Meds: . amLODipine  10 mg Oral Daily  . vitamin C  500 mg Oral Daily  . aspirin  325 mg Oral Daily  . atorvastatin  20 mg Oral q1800  . cilostazol  100 mg Oral BID  . darbepoetin (ARANESP) injection - NON-DIALYSIS  40 mcg Subcutaneous Q Mon-1800  . dexamethasone (DECADRON) injection  6 mg Intravenous Q24H  . [START ON 02/12/2019] furosemide  40 mg Oral Daily  . gabapentin  100 mg Oral BID  . heparin  5,000 Units Subcutaneous Q8H  . insulin aspart  0-15 Units Subcutaneous Q4H  . insulin aspart  5 Units Subcutaneous TID WC  . insulin detemir  0.075 Units/kg  Subcutaneous BID  . Ipratropium-Albuterol  1 puff Inhalation TID  . pantoprazole  40 mg Oral Daily  . zinc sulfate  220 mg Oral Daily   Continuous Infusions: . ferumoxytol    . remdesivir 100 mg in NS 100 mL 100 mg (02/11/19 0825)    LOS: 4 days   Kerney Elbe, DO [facility] PAGER is on AMION  If 7PM-7AM, please contact night-coverage [email or web address]

## 2019-02-11 NOTE — Progress Notes (Addendum)
Inpatient Diabetes Program Recommendations  AACE/ADA: New Consensus Statement on Inpatient Glycemic Control  Target Ranges:  Prepandial:   less than 140 mg/dL      Peak postprandial:   less than 180 mg/dL (1-2 hours)      Critically ill patients:  140 - 180 mg/dL   Results for Julie Brewer, Julie Brewer (MRN [id number]) as of 02/11/2019 14:12  Ref. Range 02/10/2019 08:17 02/10/2019 11:44 02/10/2019 13:21 02/10/2019 15:44 02/10/2019 16:43 02/10/2019 20:27 02/11/2019 00:43 02/11/2019 04:09 02/11/2019 07:37 02/11/2019 12:14  Glucose-Capillary Latest Ref Range: 70 - 99 mg/dL 103 (H) 227 (H) 222 (H) 355 (H) 350 (H) 408 (H) 357 (H) 282 (H) 260 (H) 302 (H)    Review of Glycemic Control  Diabetes history:DM2 Outpatient Diabetes medications:Lantus 14 units daily, Regular 12 units with breakfast, Regular 5-7 units with supper Current orders for Inpatient glycemic control:Levemir 5 units BID, Novolog 0-9 units Q4H; Decadron 6 mg Q24H   Inpatient Diabetes Program Recommendations:   Insulin-basal: Noted basal insulin changed to Levemir 5 units BID yesterday.  Insulin-Meal Coverage: If steroids are continued, please consider ordering Novolog 5 units TID with meals for meal coverage if patient eats at least 50% of meals.  Thanks, Barnie Alderman, RN, MSN, CDE Diabetes Coordinator Inpatient Diabetes Program [phone] (Team Pager from 8am to 5pm)

## 2019-02-12 ENCOUNTER — Inpatient Hospital Stay (HOSPITAL_COMMUNITY): Payer: Medicare Other

## 2019-02-12 LAB — CBC WITH DIFFERENTIAL/PLATELET
Abs Immature Granulocytes: 0.1 10*3/uL — ABNORMAL HIGH (ref 0.00–0.07)
Basophils Absolute: 0 10*3/uL (ref 0.0–0.1)
Basophils Relative: 0 %
Eosinophils Absolute: 0 10*3/uL (ref 0.0–0.5)
Eosinophils Relative: 0 %
HCT: 29 % — ABNORMAL LOW (ref 36.0–46.0)
Hemoglobin: 9.5 g/dL — ABNORMAL LOW (ref 12.0–15.0)
Immature Granulocytes: 1 %
Lymphocytes Relative: 5 %
Lymphs Abs: 0.6 10*3/uL — ABNORMAL LOW (ref 0.7–4.0)
MCH: 25.5 pg — ABNORMAL LOW (ref 26.0–34.0)
MCHC: 32.8 g/dL (ref 30.0–36.0)
MCV: 78 fL — ABNORMAL LOW (ref 80.0–100.0)
Monocytes Absolute: 0.9 10*3/uL (ref 0.1–1.0)
Monocytes Relative: 7 %
Neutro Abs: 11.9 10*3/uL — ABNORMAL HIGH (ref 1.7–7.7)
Neutrophils Relative %: 87 %
Platelets: 219 10*3/uL (ref 150–400)
RBC: 3.72 MIL/uL — ABNORMAL LOW (ref 3.87–5.11)
RDW: 15.1 % (ref 11.5–15.5)
WBC: 13.5 10*3/uL — ABNORMAL HIGH (ref 4.0–10.5)
nRBC: 0.1 % (ref 0.0–0.2)

## 2019-02-12 LAB — COMPREHENSIVE METABOLIC PANEL
ALT: 38 U/L (ref 0–44)
AST: 32 U/L (ref 15–41)
Albumin: 2.6 g/dL — ABNORMAL LOW (ref 3.5–5.0)
Alkaline Phosphatase: 80 U/L (ref 38–126)
Anion gap: 14 (ref 5–15)
BUN: 74 mg/dL — ABNORMAL HIGH (ref 8–23)
CO2: 17 mmol/L — ABNORMAL LOW (ref 22–32)
Calcium: 8.1 mg/dL — ABNORMAL LOW (ref 8.9–10.3)
Chloride: 100 mmol/L (ref 98–111)
Creatinine, Ser: 5.42 mg/dL — ABNORMAL HIGH (ref 0.44–1.00)
GFR calc Af Amer: 9 mL/min — ABNORMAL LOW (ref >60)
GFR calc non Af Amer: 8 mL/min — ABNORMAL LOW (ref >60)
Glucose, Bld: 195 mg/dL — ABNORMAL HIGH (ref 70–99)
Potassium: 4.8 mmol/L (ref 3.5–5.1)
Sodium: 131 mmol/L — ABNORMAL LOW (ref 135–145)
Total Bilirubin: 0.8 mg/dL (ref 0.3–1.2)
Total Protein: 5.8 g/dL — ABNORMAL LOW (ref 6.5–8.1)

## 2019-02-12 LAB — GLUCOSE, CAPILLARY
Glucose-Capillary: 144 mg/dL — ABNORMAL HIGH (ref 70–99)
Glucose-Capillary: 161 mg/dL — ABNORMAL HIGH (ref 70–99)
Glucose-Capillary: 262 mg/dL — ABNORMAL HIGH (ref 70–99)
Glucose-Capillary: 304 mg/dL — ABNORMAL HIGH (ref 70–99)
Glucose-Capillary: 314 mg/dL — ABNORMAL HIGH (ref 70–99)
Glucose-Capillary: 90 mg/dL (ref 70–99)

## 2019-02-12 LAB — LACTATE DEHYDROGENASE: LDH: 294 U/L — ABNORMAL HIGH (ref 98–192)

## 2019-02-12 LAB — PHOSPHORUS: Phosphorus: 4.1 mg/dL (ref 2.5–4.6)

## 2019-02-12 LAB — SEDIMENTATION RATE: Sed Rate: 50 mm/hr — ABNORMAL HIGH (ref 0–22)

## 2019-02-12 LAB — D-DIMER, QUANTITATIVE: D-Dimer, Quant: 1.66 ug/mL-FEU — ABNORMAL HIGH (ref 0.00–0.50)

## 2019-02-12 LAB — C-REACTIVE PROTEIN: CRP: 4 mg/dL — ABNORMAL HIGH (ref <1.0)

## 2019-02-12 LAB — MAGNESIUM: Magnesium: 1.9 mg/dL (ref 1.7–2.4)

## 2019-02-12 LAB — FIBRINOGEN: Fibrinogen: 494 mg/dL — ABNORMAL HIGH (ref 210–475)

## 2019-02-12 LAB — FERRITIN: Ferritin: 684 ng/mL — ABNORMAL HIGH (ref 11–307)

## 2019-02-12 MED ORDER — INSULIN DETEMIR 100 UNIT/ML ~~LOC~~ SOLN
10.0000 [IU] | Freq: Two times a day (BID) | SUBCUTANEOUS | Status: DC
  Administered 2019-02-12 – 2019-02-14 (×5): 10 [IU] via SUBCUTANEOUS
  Filled 2019-02-12 (×8): qty 0.1

## 2019-02-12 MED ORDER — INSULIN ASPART 100 UNIT/ML ~~LOC~~ SOLN
10.0000 [IU] | Freq: Three times a day (TID) | SUBCUTANEOUS | Status: DC
  Administered 2019-02-12 – 2019-02-13 (×5): 10 [IU] via SUBCUTANEOUS

## 2019-02-12 NOTE — Progress Notes (Addendum)
[facility] Progress Note   Subjective:    Julie Brewer is a 69 yo F w/ PMH of blindness, CKD4, T2DM, CVA, HTN, PVD admit for COVID pneumonia on hospital day 3  Julie Brewer was examined and evaluated at bedside chair this am. She mentions urinating frequently overnight. Denies any chest pain, palpitations, dyspnea. She states she feels well and endorsed no fever or dyspnea yesterday or overnight.  Making urine, does not seem to be uremic but BUN and crt cont to rise  Objective Vitals:   02/11/19 0527 02/11/19 1253 02/11/19 2100 02/12/19 0517  BP:  140/64 (!) 149/60 (!) 135/57  Pulse:  88 84 86  Resp:  (!) 21  14  Temp:  (!) 97.3 F (36.3 C) 97.8 F (36.6 C) 97.9 F (36.6 C)  TempSrc:  Oral Oral Oral  SpO2:  93%  90%  Weight: 72.1 kg     Height:       Physical Exam General: NAD, Well-appearing Heart: Tachycardic, regular rhythm, no m/r/g Lungs: CTAB, no rales, no wheezes Abdomen: BS+, NTND, soft Extremities: Peripheral pulses present, No peripheral edema Dialysis Access:  N/A  Additional Objective Labs: Basic Metabolic Panel: Recent Labs  Lab 02/10/19 0446 02/11/19 0500 02/11/19 1006 02/12/19 0251  NA 134* 132*  --  131*  K 4.9 4.8  --  4.8  CL 111 102  --  100  CO2 13* 18*  --  17*  GLUCOSE 128* 298*  --  195*  BUN 52* 61*  --  74*  CREATININE 4.66* 5.04*  --  5.42*  CALCIUM 7.6* 7.8*  --  8.1*  PHOS 3.9  --  4.1 4.1   Liver Function Tests: Recent Labs  Lab 02/09/19 0719 02/11/19 1006 02/12/19 0251  AST 23 32 32  ALT 24 35 38  ALKPHOS 78 74 80  BILITOT 0.4 0.4 0.8  PROT 5.1* 5.5* 5.8*  ALBUMIN 2.5* 2.3* 2.6*   No results for input(s): LIPASE, AMYLASE in the last 168 hours. CBC: Recent Labs  Lab 02/06/19 1335 02/07/19 0425 02/08/19 0250 02/08/19 0250 02/09/19 0719 02/09/19 0719 02/10/19 0446 02/11/19 0500 02/12/19 0251  WBC 6.9   < > 5.0   < > 5.1   < > 5.7 4.6 13.5*  NEUTROABS 5.0  --   --   --   --   --   --   --   11.9*  HGB 9.2*   < > 9.0*   < > 8.9*   < > 9.0* 9.1* 9.5*  HCT 30.1*   < > 28.0*   < > 28.8*   < > 28.7* 28.7* 29.0*  MCV 84.6   < > 79.8*  --  82.5  --  81.1 79.9* 78.0*  PLT 224   < > 91*   < > 196   < > 188 199 219   < > = values in this interval not displayed.   Blood Culture    Component Value Date/Time   SDES BLOOD RIGHT ANTECUBITAL 02/10/2019 1033   SDES BLOOD RIGHT ANTECUBITAL 02/10/2019 1033   SPECREQUEST AEROBIC BOTTLE ONLY Blood Culture adequate volume 02/10/2019 1033   SPECREQUEST  02/10/2019 1033    BOTTLES DRAWN AEROBIC AND ANAEROBIC Blood Culture adequate volume   CULT  02/10/2019 1033    NO GROWTH 2 DAYS Performed at [facility], [place] [address], [place], [place] [address]    CULT  02/10/2019 1033    NO GROWTH 2 DAYS Performed at  [facility], [place] [address], [place], [place] [address]    REPTSTATUS PENDING 02/10/2019 1033   REPTSTATUS PENDING 02/10/2019 1033    Cardiac Enzymes: No results for input(s): CKTOTAL, CKMB, CKMBINDEX, TROPONINI in the last 168 hours. CBG: Recent Labs  Lab 02/11/19 1839 02/11/19 2006 02/11/19 2355 02/12/19 0348 02/12/19 0731  GLUCAP 413* 458* 304* 161* 90   Iron Studies:  Recent Labs    02/12/19 0251  FERRITIN 684*   Studies/Results: CUP PACEART REMOTE DEVICE CHECK  Result Date: 02/10/2019 Carelink summary report received. Battery status OK. Normal device function. No new symptom episodes, tachy episodes, brady, or pause episodes. No new AF episodes. Monthly summary reports and ROV/PRN  Medications: . remdesivir 100 mg in NS 100 mL 100 mg (02/11/19 0825)   . amLODipine  10 mg Oral Daily  . vitamin C  500 mg Oral Daily  . aspirin  325 mg Oral Daily  . atorvastatin  20 mg Oral q1800  . cilostazol  100 mg Oral BID  . darbepoetin (ARANESP) injection - NON-DIALYSIS  40 mcg Subcutaneous Q Mon-1800  . dexamethasone (DECADRON) injection  6 mg Intravenous Q24H  . furosemide  40 mg Oral Daily  . gabapentin  100  mg Oral BID  . heparin  5,000 Units Subcutaneous Q8H  . insulin aspart  0-15 Units Subcutaneous Q4H  . insulin aspart  5 Units Subcutaneous TID WC  . insulin detemir  0.075 Units/kg Subcutaneous BID  . Ipratropium-Albuterol  1 puff Inhalation TID  . pantoprazole  40 mg Oral Daily  . zinc sulfate  220 mg Oral Daily    Dialysis Orders: N/A  Assessment/Plan: Assessment/Plan: 1.AKI on CKD (Baseline BUN 34, Creatinine 3) : Oxygen requirement resolved. COVID symptoms resolving. Responded to IV lasix. 900cc urine output. Appear euvolemic on exam this am. 1/12: FENa 1.3% consistent w/ intrinsic renal disease. May be having progression of chronic renal disease after acute illness w/ COVID. AM labs: K 4.8, Bicarb 17, Bun 74, Creatinine 5.42, Phos 4.1 - D/c IV furosemide, start home dose at oral furosemide 40mg  - Trend renal fx - hopefully will start to plateau or decrease - Supportive care w/ treatment of COVID pneumonia - Avoid nephrotoxic meds   2. COVID pneumonia: Afebrile overnight. Currently on Room Air. On dexamethasone and remdesivir per primary.  3. Recurrent Syncope: Orthostatic vs hypoglycemic episodes. Recurrent issue with extensive work-up in the past. Echo w/o significant findings this admission. May need to f/u with endo outpatient for insulin dosing to prevent hypoglycemia.  4. Hypertension/volume  - BP 135/57 this am.  5. Microcytic Anemia  - Stable at baseline. hgb 9.5. MCV 78 Iron sat 11%, Ferritin 101 (would expect higher in COVID infeciton). Received Feraheme 1/13 - C/w Weekly Aranesp - Trend cbc   Gilberto Better, MD PGY-2, [facility] Pager: [phone]  Attending attestation to follow  Patient seen and examined, agree with above note with above modifications. No complaints.  Pt is not uremic but BUN and crt cont to rise.  Need to continue to observe inpatient until we see some signs of plateau unfortunately  Corliss Parish, MD 02/12/2019

## 2019-02-12 NOTE — Progress Notes (Signed)
Inpatient Diabetes Program Recommendations  AACE/ADA: New Consensus Statement on Inpatient Glycemic Control   Target Ranges:  Prepandial:   less than 140 mg/dL      Peak postprandial:   less than 180 mg/dL (1-2 hours)      Critically ill patients:  140 - 180 mg/dL  Results for Julie Brewer, Julie Brewer (MRN [id number]) as of 02/12/2019 10:32  Ref. Range 02/11/2019 07:37 02/11/2019 12:14 02/11/2019 17:17 02/11/2019 18:39 02/11/2019 20:06 02/11/2019 23:55 02/12/2019 03:48 02/12/2019 07:31  Glucose-Capillary Latest Ref Range: 70 - 99 mg/dL 260 (H)  Novolog 5 units  Levemir 5 units 302 (H)  Novolog 7 units 505 (HH) 413 (H)  Novolog 27 units 458 (H)  Novolog 15 units @ 20:53  Novolog 10 units @ 21:12  Levemir 5 units 304 (H)  Novolog 11 units 161 (H)  Novolog 3 units 90  Novolog 5 units       Levemir 5 units    Review of Glycemic Control  Diabetes history: DM2 Outpatient Diabetes medications: Lantus 14 units daily, Regular 12 units with breakfast, Regular 5-7 units with supper Current orders for Inpatient glycemic control:Levemir 5 units BID, Novolog 0-15 units Q4H, Novolog 5 units TID with meals; Decadron 6 mg Q24H  Inpatient Diabetes Program Recommendations:   Insulin-Basal: If steroids are continued, please consider increasing Levemir to 10 units BID.  Insulin-Meal Coverage: If steroids are continued, please consider increasing meal coverage to Novolog 10 units TID with meals.  Thanks, Barnie Alderman, RN, MSN, CDE Diabetes Coordinator Inpatient Diabetes Program [phone] (Team Pager from 8am to 5pm)

## 2019-02-12 NOTE — Progress Notes (Signed)
PROGRESS NOTE    Julie Brewer  ZOX:[id number] DOB: 1950/11/08 DOA: 02/06/2019 PCP: [name], MD   Brief Narrative:  The patient is a 69 year old female with past medical history of hypertension, CKD 4 with proteinuria, type 2 diabetes and follows with endocrinology, tobacco use, hypoglycemic episodes noted in the past, peripheral vascular disease, multiple syncopal episodes in the past with loop recorder placed 06/20/2016 without evidence of arrhythmia and follows with cardiology, who presented to the ED on 1/8 for syncopal episode of unclear etiology.  She was found to be COVID-19 positive on admission.  Also with AKI which had improvement with IV fluid.  Was not started on COVID-19 directed treatment until 1/12 as below.  1/11: Worsened AKI and metabolic acidosis, nephrology consulted started on IV fluids.  1/12: Febrile overnight, requiring O2 2 L/min nasal cannula.  Started on dexamethasone and remdesivir.  AKI worsened, started on bicarb drip.  1/13: She continues to be treated with remdesivir and dexamethasone.  Fevers improved slightly today.  Also started on Combivent and albuterol as needed.  Blood sugars are significantly uncontrolled now given steroid demargination.  1/14: Urine creatinine still continue to rise but she has been diuresed and states that she is had good urine output.  She has been afebrile and chest x-ray is improved.  Inflammatory markers are trending down.  Blood sugars remain uncontrolled and will be making adjustments.  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 Sallyanne Kuster, interrogated 02/06/2019 - unremarkable -Carotid ultrasound unremarkable 1/10 -Limited echo unremarkable 1/10 -Now starting steroids, will start higher doses of insulin as below and will adjust  AKI on CKD 4, worsened, COVID-19 GN? 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 with her slight worsening of her renal function but she is nonoliguric -Urine lites calculated to female which was 1.3 consistent with intrinsic renal disease -Nephrology feels that this may be progression of her chronic renal disease after acute illness with Covid -IV fluids been stopped and started furosemide 40 mg IV twice daily yesterday and was quickly transitioned to oral home dose 40 mg today  -Recommend continuing to trend renal function with supportive care the treatment of Covid pneumonia  -Avoid nephrotoxic medications, contrast dyes, hypotension and renally adjust medications -Continue monitor trend and repeat CMP in a.m. -Nephrology feels that she is not uremic and even though that her renal function is trending worse there is no current indication for hemodialysis: They feel that they want to see a plateau before she can be safely discharged -Patient's BUN and creatinine has now worsened to 81/1.91  Metabolic Acidosis -Sodium sodium bicarb infusion 150 mEq in D5 75 cc/h  per nephro now  stopped -Patient's CO2 is now 17, chloride level is 100, and anion gap is 14  Type 2 Diabetes with Hyperglycemia now but likely had hypoglycemia previously -Follows with endocrinology, concern for hypoglycemic unawareness in the past -HA1C 7.1 on 02/07/19 -Does not check blood sugar regularly at home -Outpatient meds:Lantus 14 units daily, Regular 12 u with breakfast, Regular 5-7 u with dinner -Tolerated Lantus 8 units monotherapy well while inpatient and could consider discharging on this regimen when ready -Starting Decadron -> start COVID-19 hyperglycemic order set, starting with sensitive dosing given hypoglycemia and reduced renal function; her Levemir was increased to 10 units twice daily -CBGs have now been ranging from 161-458 but have now improved after making adjustments and are now 90-144 -NovoLog sliding scale insulin has been increased to moderate and have added 5 units 3 times daily with meals which is now increased to 10 units 3 times daily with meals  SIRS secondary to COVID-19 -Previously no O2 requirement and minimal symptoms therefore has not been treated -1/12 overnight: T max 103 F, tachycardic, tachypneic requiring 2 L/min O2, lactic acid negative -Initial CXR showed "Retrocardiac left lower lobe airspace disease, suspicious for pneumonia. If clinical signs/symptoms are consistent with pneumonia, recommend chest radiographic follow-up to confirm resolution. Alternatively, chest CT could be performed for further evaluation" -Checked Baseline Inflammatory Markers and Trend Daily:  Recent Labs    02/10/19 1500 02/10/19 1800 02/11/19 0500 02/12/19 0251  DDIMER  --  1.93* 1.69* 1.66*  FERRITIN 201  --  346* 684*  LDH  --   --   --  294*  CRP 6.3*  --  6.5* 4.0*  -ESR was 50 -Fibrinogen was 494 Lab Results  Component Value Date   SARSCOV2NAA POSITIVE (A) 02/06/2019   [place] NEGATIVE 06/20/2018  -SpO2: 97 % O2 Flow Rate (L/min): 2 L/min -Started Treatment  with 5 Days of Remdesivir and 10 Days of IV Decadron; Day 3 of Resmdesivir and Day 3/10 of Steroids  -C/w Antitussives with Guaifenesin-Dextromethorphan 10 mL po q4hprn Cough and Chlorpheniramine-Hydrocodone 5 mL po q12hprn -Check Blood Cx x2 and showed NGTD at 2 Days -C/w Zinc 220 mg po Daily and Vitamin C  -Airborne and Contact Precautions -C/w Combivent Scheduled 1 puff IH TID and Albuterol 1-2 puff IH q4hprn -Add Flutter Valve and Incentive Spirometry -Repeat CXR showed "No active disease." -Continue to monitor and trend inflammatory markers and repeat chest x-ray imaging in the a.m.   Hypomagnesemia -Improved. Mag Level was 1.9 -Continue to Monitor and Replete as Necessary -Repeat Mag Level in the AM   Hyperlipidemia  -C/w Atorvastatin 20 mg po Daily   Hypertension, difficulty to control in the past -Increased Amlodipine to 10 mg -Discontinued labetalol for concern of hypoglycemia unawareness -BP was 148/57  PVD -Continue Cilostazol 100 mg po BID and with ASA 325 mg po BID and Atorvastatin 20 mg po qHS -Carotid ultrasound as above  Iron deficiency anemia -Anemia panel done recently showed an iron level of 20, U IBC of 155, TIBC 175, saturation ratios of 11%, ferritin level of 101 -Continued iron supplement and received a dose of Ferumoxytol 510 mg  Yesterday per Nephrology and Darbepoetin Alpha 40 mcg on Monday  -Patient's Hb/HCt went from 8.9/28.8 -> 9.0/28.7 -> 9.1/28.7 -> 9.5/29.0 -Continue to monitor for signs and symptoms of bleeding; currently no overt bleeding noted -Repeat CBC in AM   Tobacco use with Emphysema -Smokes half pack per day -Tobacco cessation provided  Atraumatic right eyelid  swelling, unknown etiology, resolved -Continue eyedrops with Liquifilm Tears 1 drop Right Eye as Needed  GERD -C/w Pantoprazole 40 mg po Daily   GOC: DNR poA  DVT prophylaxis: Heparin 5,000 units sq q8h  Code Status: DO NOT RESUSCITATE Family Communication: Discussed  with Daughter Comptroller) over the Telephone Disposition Plan: Pending Improvement in Respiratory Status, Renal Fxn, and completion of Remdesivir   Consultants:   Nephrology   Procedures: None   Antimicrobials:  Anti-infectives (From admission, onward)   Start     Dose/Rate Route Frequency Ordered Stop   02/11/19 1000  remdesivir 100 mg in sodium chloride 0.9 % 100 mL IVPB     100 mg 200 mL/hr over 30 Minutes Intravenous Daily 02/10/19 0803 02/15/19 0959   02/10/19 1000  remdesivir 200 mg in sodium chloride 0.9% 250 mL IVPB     200 mg 580 mL/hr over 30 Minutes Intravenous Once 02/10/19 0803 02/10/19 1145     Subjective: Seen and examined at bedside she is sitting in the chair and states that she is feeling well.  Thinks that her legs are less tight and less swollen.  No nausea or vomiting.  Denies any chest pain, lightheadedness or dizziness.  No shortness breath.  No other concerns or complaints at this time.  Objective: Vitals:   02/11/19 1253 02/11/19 2100 02/12/19 0517 02/12/19 1440  BP: 140/64 (!) 149/60 (!) 135/57 (!) 148/57  Pulse: 88 84 86 92  Resp: (!) 21  14 (!) 21  Temp: (!) 97.3 F (36.3 C) 97.8 F (36.6 C) 97.9 F (36.6 C) 98.2 F (36.8 C)  TempSrc: Oral Oral Oral Oral  SpO2: 93%  90% 97%  Weight:      Height:        Intake/Output Summary (Last 24 hours) at 02/12/2019 1552 Last data filed at 02/12/2019 1400 Gross per 24 hour  Intake 820 ml  Output 700 ml  Net 120 ml   Filed Weights   02/06/19 1251 02/07/19 0251 02/11/19 0527  Weight: 63.5 kg 65 kg 72.1 kg   Examination: Physical Exam:  Constitutional: WN/WD OVERWEIGHT AFRICAN-AMERICAN FEMALE CURRENTLY IN NO ACUTE DISTRESS APPEARS CALM AND COMFORTABLE SITTING IN A CHAIR BEDSIDE 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 Grossly normal hearing. Mucous membranes are moist.  Neck: Appears normal, supple, no cervical masses, normal ROM, no appreciable thyromegaly; no JVD Respiratory:  Diminished to auscultation bilaterally, no wheezing, rales, rhonchi or crackles. Normal respiratory effort and patient is not tachypenic. No accessory muscle use.  Unlabored breathing without any supplemental oxygen via nasal cannula Cardiovascular: RRR, no murmurs / rubs / gallops. S1 and S2 auscultated.  Mild extremity edema. Abdomen: Soft, non-tender, diminished to body habitus. Bowel sounds positive x4.  GU: Deferred. Musculoskeletal: No clubbing / cyanosis of digits/nails. Normal strength and muscle tone.  Skin: No rashes, lesions, ulcers on limited skin evaluation. No induration; Warm and dry.  Neurologic: CN 2-12 grossly intact with no focal deficits. Romberg sign and cerebellar reflexes not assessed.  Psychiatric: Normal judgment and insight. Alert and oriented x 3. Pleasant mood and appropriate affect.   Data Reviewed: I have personally reviewed following labs and imaging studies  CBC: Recent Labs  Lab 02/06/19 1335 02/07/19 0425 02/08/19 0250 02/09/19 0719 02/10/19 0446 02/11/19 0500 02/12/19 0251  WBC 6.9   < > 5.0 5.1 5.7 4.6 13.5*  NEUTROABS 5.0  --   --   --   --   --  11.9*  HGB 9.2*   < >  9.0* 8.9* 9.0* 9.1* 9.5*  HCT 30.1*   < > 28.0* 28.8* 28.7* 28.7* 29.0*  MCV 84.6   < > 79.8* 82.5 81.1 79.9* 78.0*  PLT 224   < > 91* 196 188 199 219   < > = values in this interval not displayed.   Basic Metabolic Panel: Recent Labs  Lab 02/07/19 0425 02/07/19 0425 02/08/19 0250 02/09/19 0719 02/10/19 0446 02/11/19 0500 02/11/19 1006 02/12/19 0251  NA 140   < > 136 136 134* 132*  --  131*  K 4.5   < > 4.7 5.0 4.9 4.8  --  4.8  CL 113*   < > 113* 111 111 102  --  100  CO2 16*   < > 17* 16* 13* 18*  --  17*  GLUCOSE 86   < > 78 112* 128* 298*  --  195*  BUN 49*   < > 42* 48* 52* 61*  --  74*  CREATININE 3.71*   < > 3.66* 4.48* 4.66* 5.04*  --  5.42*  CALCIUM 8.1*   < > 8.0* 7.8* 7.6* 7.8*  --  8.1*  MG 1.6*  --  1.9 1.9  --   --  1.8 1.9  PHOS 4.0   < > 3.8 4.4 3.9   --  4.1 4.1   < > = values in this interval not displayed.   GFR: Estimated Creatinine Clearance: 9.7 mL/min (A) (by C-G formula based on SCr of 5.42 mg/dL (H)). Liver Function Tests: Recent Labs  Lab 02/06/19 1335 02/09/19 0719 02/11/19 1006 02/12/19 0251  AST 15 23 32 32  ALT 19 24 35 38  ALKPHOS 88 78 74 80  BILITOT 0.2* 0.4 0.4 0.8  PROT 5.9* 5.1* 5.5* 5.8*  ALBUMIN 3.0* 2.5* 2.3* 2.6*   No results for input(s): LIPASE, AMYLASE in the last 168 hours. No results for input(s): AMMONIA in the last 168 hours. Coagulation Profile: No results for input(s): INR, PROTIME in the last 168 hours. Cardiac Enzymes: No results for input(s): CKTOTAL, CKMB, CKMBINDEX, TROPONINI in the last 168 hours. BNP (last 3 results) No results for input(s): PROBNP in the last 8760 hours. HbA1C: No results for input(s): HGBA1C in the last 72 hours. CBG: Recent Labs  Lab 02/11/19 2006 02/11/19 2355 02/12/19 0348 02/12/19 0731 02/12/19 1208  GLUCAP 458* 304* 161* 90 144*   Lipid Profile: No results for input(s): CHOL, HDL, LDLCALC, TRIG, CHOLHDL, LDLDIRECT in the last 72 hours. Thyroid Function Tests: No results for input(s): TSH, T4TOTAL, FREET4, T3FREE, THYROIDAB in the last 72 hours. Anemia Panel: Recent Labs    02/11/19 0500 02/12/19 0251  FERRITIN 346* 684*   Sepsis Labs: Recent Labs  Lab 02/07/19 1059 02/07/19 1434 02/10/19 1033 02/10/19 1500  LATICACIDVEN 0.8 0.7 1.0 1.2    Recent Results (from the past 240 hour(s))  SARS CORONAVIRUS 2 (TAT 6-24 HRS) Nasopharyngeal Nasopharyngeal Swab     Status: Abnormal   Collection Time: 02/06/19  3:17 PM   Specimen: Nasopharyngeal Swab  Result Value Ref Range Status   SARS Coronavirus 2 POSITIVE (A) NEGATIVE Final    Comment: RESULT CALLED TO, READ BACK BY AND VERIFIED WITH: J.ROMAS RN 2046 02/06/19 MCCORMICK K (NOTE) SARS-CoV-2 target nucleic acids are DETECTED. The SARS-CoV-2 RNA is generally detectable in upper and  lower respiratory specimens during the acute phase of infection. Positive results are indicative of the presence of SARS-CoV-2 RNA. Clinical correlation with patient history and other diagnostic information is  necessary to determine patient infection status. Positive results do not rule out bacterial infection or co-infection with other viruses.  The expected result is Negative. Fact Sheet for Patients: SugarRoll.be Fact Sheet for Healthcare Providers: https://www.woods-mathews.com/ This test is not yet approved or cleared by the [place] FDA and  has been authorized for detection and/or diagnosis of SARS-CoV-2 by FDA under an Emergency Use Authorization (EUA). This EUA will remain  in effect (meaning this test can be used) for th e duration of the COVID-19 declaration under Section 564(b)(1) of the Act, 21 U.S.C. section 360bbb-3(b)(1), unless the authorization is terminated or revoked sooner. Performed at [facility], [place] [address] [place], [place] [address]   MRSA PCR Screening     Status: None   Collection Time: 02/07/19  2:48 AM   Specimen: Nasal Mucosa; Nasopharyngeal  Result Value Ref Range Status   MRSA by PCR NEGATIVE NEGATIVE Final    Comment:        The GeneXpert MRSA Assay (FDA approved for NASAL specimens only), is one component of a comprehensive MRSA colonization surveillance program. It is not intended to diagnose MRSA infection nor to guide or monitor treatment for MRSA infections. Performed at [facility], [place] [address] [place], [place] [address]   Culture, blood (routine x 2)     Status: None (Preliminary result)   Collection Time: 02/10/19 10:33 AM   Specimen: BLOOD  Result Value Ref Range Status   Specimen Description BLOOD RIGHT ANTECUBITAL  Final   Special Requests AEROBIC BOTTLE ONLY Blood Culture adequate volume  Final   Culture   Final    NO GROWTH 2 DAYS Performed at [facility], [place] [address], [place], [place] [address]    Report Status PENDING  Incomplete  Culture, blood (routine x 2)     Status: None (Preliminary result)   Collection Time: 02/10/19 10:33 AM   Specimen: BLOOD  Result Value Ref Range Status   Specimen Description BLOOD RIGHT ANTECUBITAL  Final   Special Requests   Final    BOTTLES DRAWN AEROBIC AND ANAEROBIC Blood Culture adequate volume   Culture   Final    NO GROWTH 2 DAYS Performed at [facility], [place] [address], [place], [place] [address]    Report Status PENDING  Incomplete    Radiology Studies: DG CHEST PORT 1 VIEW  Result Date: 02/12/2019 CLINICAL DATA:  Shortness of breath. COVID-19 virus infection. EXAM: PORTABLE CHEST 1 VIEW COMPARISON:  02/06/2019 FINDINGS: The heart size and mediastinal contours are within normal limits. Aortic atherosclerosis incidentally noted. Chronic pulmonary interstitial prominence is unchanged. Mild bilateral upper lobe scarring is also stable. No evidence of acute infiltrate or pleural effusion. Previously seen left lower lobe infiltrate has resolved since prior exam. Cardiac loop recorder is again noted. IMPRESSION: No active disease. Electronically Signed   By: Marlaine Hind M.D.   On: 02/12/2019 09:01    Scheduled Meds: . amLODipine  10 mg Oral Daily  . vitamin C  500 mg Oral Daily  . aspirin  325 mg Oral Daily  . atorvastatin  20 mg Oral q1800  . cilostazol  100 mg Oral BID  . darbepoetin (ARANESP) injection - NON-DIALYSIS  40 mcg Subcutaneous Q Mon-1800  . dexamethasone (DECADRON) injection  6 mg Intravenous Q24H  . furosemide  40 mg Oral Daily  . gabapentin  100 mg Oral BID  . heparin  5,000 Units Subcutaneous Q8H  . insulin  aspart  0-15 Units Subcutaneous Q4H  . insulin aspart  10 Units Subcutaneous TID WC  . insulin detemir  10 Units Subcutaneous BID  . Ipratropium-Albuterol  1 puff Inhalation TID  . pantoprazole  40 mg Oral Daily  . zinc sulfate  220 mg Oral Daily    Continuous Infusions: . remdesivir 100 mg in NS 100 mL 100 mg (02/12/19 0907)    LOS: 5 days   Kerney Elbe, DO [facility] PAGER is on [place]  If 7PM-7AM, please contact night-coverage [email or web address]

## 2019-02-13 ENCOUNTER — Ambulatory Visit: Payer: Medicare Other | Admitting: Endocrinology

## 2019-02-13 LAB — COMPREHENSIVE METABOLIC PANEL
ALT: 43 U/L (ref 0–44)
AST: 33 U/L (ref 15–41)
Albumin: 2.5 g/dL — ABNORMAL LOW (ref 3.5–5.0)
Alkaline Phosphatase: 78 U/L (ref 38–126)
Anion gap: 14 (ref 5–15)
BUN: 91 mg/dL — ABNORMAL HIGH (ref 8–23)
CO2: 17 mmol/L — ABNORMAL LOW (ref 22–32)
Calcium: 8 mg/dL — ABNORMAL LOW (ref 8.9–10.3)
Chloride: 97 mmol/L — ABNORMAL LOW (ref 98–111)
Creatinine, Ser: 6.09 mg/dL — ABNORMAL HIGH (ref 0.44–1.00)
GFR calc Af Amer: 8 mL/min — ABNORMAL LOW (ref >60)
GFR calc non Af Amer: 7 mL/min — ABNORMAL LOW (ref >60)
Glucose, Bld: 211 mg/dL — ABNORMAL HIGH (ref 70–99)
Potassium: 5.1 mmol/L (ref 3.5–5.1)
Sodium: 128 mmol/L — ABNORMAL LOW (ref 135–145)
Total Bilirubin: 0.6 mg/dL (ref 0.3–1.2)
Total Protein: 5.3 g/dL — ABNORMAL LOW (ref 6.5–8.1)

## 2019-02-13 LAB — CBC WITH DIFFERENTIAL/PLATELET
Abs Immature Granulocytes: 0.13 10*3/uL — ABNORMAL HIGH (ref 0.00–0.07)
Basophils Absolute: 0 10*3/uL (ref 0.0–0.1)
Basophils Relative: 0 %
Eosinophils Absolute: 0 10*3/uL (ref 0.0–0.5)
Eosinophils Relative: 0 %
HCT: 27.7 % — ABNORMAL LOW (ref 36.0–46.0)
Hemoglobin: 9.3 g/dL — ABNORMAL LOW (ref 12.0–15.0)
Immature Granulocytes: 1 %
Lymphocytes Relative: 4 %
Lymphs Abs: 0.6 10*3/uL — ABNORMAL LOW (ref 0.7–4.0)
MCH: 25.8 pg — ABNORMAL LOW (ref 26.0–34.0)
MCHC: 33.6 g/dL (ref 30.0–36.0)
MCV: 76.7 fL — ABNORMAL LOW (ref 80.0–100.0)
Monocytes Absolute: 0.9 10*3/uL (ref 0.1–1.0)
Monocytes Relative: 6 %
Neutro Abs: 12.9 10*3/uL — ABNORMAL HIGH (ref 1.7–7.7)
Neutrophils Relative %: 89 %
Platelets: 284 10*3/uL (ref 150–400)
RBC: 3.61 MIL/uL — ABNORMAL LOW (ref 3.87–5.11)
RDW: 14.8 % (ref 11.5–15.5)
WBC: 14.5 10*3/uL — ABNORMAL HIGH (ref 4.0–10.5)
nRBC: 0.2 % (ref 0.0–0.2)

## 2019-02-13 LAB — MAGNESIUM: Magnesium: 1.8 mg/dL (ref 1.7–2.4)

## 2019-02-13 LAB — OSMOLALITY, URINE: Osmolality, Ur: 218 mOsm/kg — ABNORMAL LOW (ref 300–900)

## 2019-02-13 LAB — GLUCOSE, CAPILLARY
Glucose-Capillary: 105 mg/dL — ABNORMAL HIGH (ref 70–99)
Glucose-Capillary: 162 mg/dL — ABNORMAL HIGH (ref 70–99)
Glucose-Capillary: 196 mg/dL — ABNORMAL HIGH (ref 70–99)
Glucose-Capillary: 211 mg/dL — ABNORMAL HIGH (ref 70–99)
Glucose-Capillary: 233 mg/dL — ABNORMAL HIGH (ref 70–99)
Glucose-Capillary: 241 mg/dL — ABNORMAL HIGH (ref 70–99)

## 2019-02-13 LAB — SODIUM, URINE, RANDOM: Sodium, Ur: 20 mmol/L

## 2019-02-13 LAB — FERRITIN: Ferritin: 524 ng/mL — ABNORMAL HIGH (ref 11–307)

## 2019-02-13 LAB — PHOSPHORUS: Phosphorus: 4.9 mg/dL — ABNORMAL HIGH (ref 2.5–4.6)

## 2019-02-13 LAB — OSMOLALITY: Osmolality: 298 mOsm/kg — ABNORMAL HIGH (ref 275–295)

## 2019-02-13 LAB — D-DIMER, QUANTITATIVE: D-Dimer, Quant: 1.37 ug/mL-FEU — ABNORMAL HIGH (ref 0.00–0.50)

## 2019-02-13 LAB — SEDIMENTATION RATE: Sed Rate: 60 mm/hr — ABNORMAL HIGH (ref 0–22)

## 2019-02-13 LAB — C-REACTIVE PROTEIN: CRP: 2.2 mg/dL — ABNORMAL HIGH (ref <1.0)

## 2019-02-13 LAB — LACTATE DEHYDROGENASE: LDH: 305 U/L — ABNORMAL HIGH (ref 98–192)

## 2019-02-13 LAB — FIBRINOGEN: Fibrinogen: 455 mg/dL (ref 210–475)

## 2019-02-13 MED ORDER — SENNOSIDES-DOCUSATE SODIUM 8.6-50 MG PO TABS
1.0000 | ORAL_TABLET | Freq: Two times a day (BID) | ORAL | Status: DC
  Administered 2019-02-13 – 2019-02-16 (×5): 1 via ORAL
  Filled 2019-02-13 (×6): qty 1

## 2019-02-13 MED ORDER — INSULIN ASPART 100 UNIT/ML ~~LOC~~ SOLN
13.0000 [IU] | Freq: Three times a day (TID) | SUBCUTANEOUS | Status: DC
  Administered 2019-02-14 (×3): 13 [IU] via SUBCUTANEOUS

## 2019-02-13 NOTE — Progress Notes (Signed)
PROGRESS NOTE    Julie Brewer  PIR:[id number] DOB: [birth year] DOA: 02/06/2019 PCP: [name], MD   Brief Narrative:  The patient is a 69 year old female with past medical history of hypertension, CKD 4 with proteinuria, type 2 diabetes and follows with endocrinology, tobacco use, hypoglycemic episodes noted in the past, peripheral vascular disease, multiple syncopal episodes in the past with loop recorder placed 06/20/2016 without evidence of arrhythmia and follows with cardiology, who presented to the ED on 1/8 for syncopal episode of unclear etiology.  She was found to be COVID-19 positive on admission.  Also with AKI which had improvement with IV fluid.  Was not started on COVID-19 directed treatment until 1/12 as below.  1/11: Worsened AKI and metabolic acidosis, nephrology consulted started on IV fluids.  1/12: Febrile overnight, requiring O2 2 L/min nasal cannula.  Started on dexamethasone and remdesivir.  AKI worsened, started on bicarb drip.  1/13: She continues to be treated with remdesivir and dexamethasone.  Fevers improved slightly today.  Also started on Combivent and albuterol as needed.  Blood sugars are significantly uncontrolled now given steroid demargination.  1/14: Urine creatinine still continue to rise but she has been diuresed and states that she is had good urine output.  She has been afebrile and chest x-ray is improved.  Inflammatory markers are trending down.  Blood sugars remain uncontrolled and will be making adjustments.  1/15: Patient's respiratory status is stable however she is having worsening renal function.  Nephrology recommending possible dialysis soon and are starting to work her up for her hyponatremia.  Patient has no other complaints at this time.  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 Sallyanne Kuster, interrogated 02/06/2019 - unremarkable -Carotid ultrasound unremarkable 1/10 -Limited echo unremarkable 1/10 -Now starting steroids, will start higher doses of insulin as below and will adjust  AKI on CKD 4, worsened, COVID-19 GN? 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 with her slight worsening of her renal function but she is nonoliguric -Urine lites calculated to female which was 1.3 consistent with intrinsic renal disease -Nephrology feels that this may be progression of her chronic renal disease after acute illness with Covid -IV fluids been stopped and started furosemide 40 mg IV twice daily yesterday and was quickly transitioned to oral home dose 40 mg today  -Recommend continuing to trend renal function with supportive care the treatment of Covid pneumonia  -Avoid nephrotoxic medications, contrast dyes, hypotension and renally adjust medications -Continue monitor trend and repeat CMP in a.m. -Nephrology feels that she is not uremic and even though that her renal function is trending worse there is no current indication  for hemodialysis: They feel that they want to see a plateau before she can be safely discharged -Patient's BUN and creatinine has now worsened further from 74/5.42 to 92/4.26  Metabolic Acidosis -Sodium sodium bicarb infusion 150 mEq in D5 75 cc/h per nephro now stopped -Patient's CO2 is now 17, chloride level of 97, and anion gap is 14   Type 2 Diabetes with Hyperglycemia now but likely had hypoglycemia previously -Follows with endocrinology, concern for hypoglycemic unawareness in the past -HA1C 7.1 on 02/07/19 -Does not check blood sugar regularly at home -Outpatient meds:Lantus 14 units daily, Regular 12 u with breakfast, Regular 5-7 u with dinner -Tolerated Lantus 8 units monotherapy well while inpatient and could consider discharging on this regimen when ready -Starting Decadron -> start COVID-19 hyperglycemic order set, starting with sensitive dosing given hypoglycemia and reduced renal function; her Levemir was increased to 10 units twice daily -CBGs have now ranging from 105-211  -NovoLog sliding scale insulin has been increased to moderate and have added 5 units 3 times daily with meals which is now increased to 10 units 3 times daily with meals but will now increase to 13 units TID  SIRS secondary to COVID-19 -Previously no O2 requirement and minimal symptoms therefore has not been treated -1/12 overnight: T max 103 F, tachycardic, tachypneic requiring 2 L/min O2, lactic acid negative -Initial CXR showed "Retrocardiac left lower lobe airspace disease, suspicious for pneumonia. If clinical signs/symptoms are consistent with pneumonia, recommend chest radiographic follow-up to confirm resolution. Alternatively, chest CT could be performed for further evaluation" -Checked Baseline Inflammatory Markers and Trend Daily:  Recent Labs    02/11/19 0500 02/12/19 0251 02/13/19 0249  DDIMER 1.69* 1.66* 1.37*  FERRITIN 346* 684* 524*  LDH  --  294* 305*  CRP 6.5* 4.0* 2.2*  -ESR was  50 and now worsened to 60 -Fibrinogen was 494 now is 455 Lab Results  Component Value Date   SARSCOV2NAA POSITIVE (A) 02/06/2019   [place] NEGATIVE 06/20/2018  -SpO2: 94 % O2 Flow Rate (L/min): 2 L/min -Started Treatment with 5 Days of Remdesivir and 10 Days of IV Decadron; Day 4 of Resmdesivir and Day 4/10 of Steroids  -C/w Antitussives with Guaifenesin-Dextromethorphan 10 mL po q4hprn Cough and Chlorpheniramine-Hydrocodone 5 mL po q12hprn -Check Blood Cx x2 and showed NGTD at 2 Days -C/w Zinc 220 mg po Daily and Vitamin C  -Airborne and Contact Precautions -C/w Combivent Scheduled 1 puff IH TID and Albuterol 1-2 puff IH q4hprn -Add Flutter Valve and Incentive Spirometry -Repeat CXR showed "No active disease." -Continue to monitor and trend inflammatory markers and repeat chest x-ray imaging in the a.m.   Hypomagnesemia -Improved. Mag Level was 1.8 -Continue to Monitor and Replete as Necessary -Repeat Mag Level in the AM   Hyperlipidemia  -C/w Atorvastatin 20 mg po Daily   Hypertension, difficulty to control in the past -Increased Amlodipine to 10 mg -Discontinued labetalol for concern of hypoglycemia unawareness -BP was 151/59  PVD -Continue Cilostazol 100 mg po BID and with ASA 325 mg po BID and Atorvastatin 20 mg po qHS -Carotid ultrasound as above  Iron Deficiency Anemia -Anemia panel done recently showed an iron level of 20, U IBC of 155, TIBC 175, saturation ratios of 11%, ferritin level of 101 -Continued iron supplement and received a dose of Ferumoxytol 510 mg  Yesterday per Nephrology and Darbepoetin Alpha 40 mcg on Monday  -Patient's Hb/HCt went from 8.9/28.8 -> 9.0/28.7 -> 9.1/28.7 -> 9.5/29.0 -> 9.3/27.7 -Continue to monitor  for signs and symptoms of bleeding; currently no overt bleeding noted -Repeat CBC in AM   Tobacco use with Emphysema -Smokes half pack per day -Tobacco cessation provided  Atraumatic right eyelid swelling, unknown etiology, resolved  -Continue eyedrops with Liquifilm Tears 1 drop Right Eye as Needed  GERD -C/w Pantoprazole 40 mg po Daily   Hyponatremia -Slightly worsened -Patient sodium was132 is now 75 -Nephrology is working this up and has ordered serum osmolality, urine osmolality, as well as urine sodium  Leukocytosis -Patient's WBC went from 4.6 -> 13.5 -> 14.5 -In the setting of steroid demargination -Continue to monitor for signs and symptoms of infection  -Repeat CBC in a.m.  GOC: DNR poA  DVT prophylaxis: Heparin 5,000 units sq q8h  Code Status: DO NOT RESUSCITATE Family Communication: Discussed with Daughter Comptroller) over the Telephone Disposition Plan: Pending Improvement in Respiratory Status, Renal Fxn, and completion of Remdesivir   Consultants:   Nephrology   Procedures: None   Antimicrobials:  Anti-infectives (From admission, onward)   Start     Dose/Rate Route Frequency Ordered Stop   02/11/19 1000  remdesivir 100 mg in sodium chloride 0.9 % 100 mL IVPB     100 mg 200 mL/hr over 30 Minutes Intravenous Daily 02/10/19 0803 02/15/19 0959   02/10/19 1000  remdesivir 200 mg in sodium chloride 0.9% 250 mL IVPB     200 mg 580 mL/hr over 30 Minutes Intravenous Once 02/10/19 0803 02/10/19 1145     Subjective: Seen and examined at bedside she is sitting in the chair and has no respiratory complaints.  Feeling well and still making urine but a little frustrated that her renal function is worsening.  No chest pain, lightheadedness or dizziness.  No other concerns or complaints at this time.  Objective: Vitals:   02/12/19 1440 02/12/19 2244 02/13/19 0542 02/13/19 1236  BP: (!) 148/57 128/67 (!) 156/63 (!) 151/59  Pulse: 92 90 90 92  Resp: (!) 21 16 19  (!) 24  Temp: 98.2 F (36.8 C) 98.5 F (36.9 C) 98.8 F (37.1 C) (!) 97.3 F (36.3 C)  TempSrc: Oral Oral Oral Oral  SpO2: 97% 91% 91% 94%  Weight:   70.6 kg   Height:        Intake/Output Summary (Last 24 hours) at 02/13/2019  1742 Last data filed at 02/13/2019 1400 Gross per 24 hour  Intake 520 ml  Output 1200 ml  Net -680 ml   Filed Weights   02/07/19 0251 02/11/19 0527 02/13/19 0542  Weight: 65 kg 72.1 kg 70.6 kg   Examination: Physical Exam:  Constitutional: WN/WD overweight African-American female currently in no acute distress appears calm and comfortable sitting in a chair bedside again 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 Grossly normal hearing. Mucous membranes are moist.  Neck: Appears normal, supple, no cervical masses, normal ROM, no appreciable thyromegaly; no JVD Respiratory: Diminished to auscultation bilaterally with slightly coarse breath sounds, no wheezing, rales, rhonchi or crackles. Normal respiratory effort and patient is not tachypenic. No accessory muscle use.  Unlabored breathing and is not using any supplemental oxygen via nasal cannula Cardiovascular: RRR, no murmurs / rubs / gallops. S1 and S2 auscultated.  Mild extremity edema.   Abdomen: Soft, non-tender, non-distended. Bowel sounds positive x4.  GU: Deferred. Musculoskeletal: No clubbing / cyanosis of digits/nails. No joint deformity upper and lower extremities.  Skin: No rashes, lesions, ulcers on a limited skin evaluation. No induration; Warm and dry.  Neurologic: CN  2-12 grossly intact with no focal deficits. Romberg sign and cerebellar reflexes not assessed.  Psychiatric: Normal judgment and insight. Alert and oriented x 3. Pleasant mood and appropriate affect.   Data Reviewed: I have personally reviewed following labs and imaging studies  CBC: Recent Labs  Lab 02/09/19 0719 02/10/19 0446 02/11/19 0500 02/12/19 0251 02/13/19 0249  WBC 5.1 5.7 4.6 13.5* 14.5*  NEUTROABS  --   --   --  11.9* 12.9*  HGB 8.9* 9.0* 9.1* 9.5* 9.3*  HCT 28.8* 28.7* 28.7* 29.0* 27.7*  MCV 82.5 81.1 79.9* 78.0* 76.7*  PLT 196 188 199 219 643   Basic Metabolic Panel: Recent Labs  Lab 02/08/19 0250 02/08/19  0250 02/09/19 0719 02/10/19 0446 02/11/19 0500 02/11/19 1006 02/12/19 0251 02/13/19 0249  NA 136   < > 136 134* 132*  --  131* 128*  K 4.7   < > 5.0 4.9 4.8  --  4.8 5.1  CL 113*   < > 111 111 102  --  100 97*  CO2 17*   < > 16* 13* 18*  --  17* 17*  GLUCOSE 78   < > 112* 128* 298*  --  195* 211*  BUN 42*   < > 48* 52* 61*  --  74* 91*  CREATININE 3.66*   < > 4.48* 4.66* 5.04*  --  5.42* 6.09*  CALCIUM 8.0*   < > 7.8* 7.6* 7.8*  --  8.1* 8.0*  MG 1.9  --  1.9  --   --  1.8 1.9 1.8  PHOS 3.8   < > 4.4 3.9  --  4.1 4.1 4.9*   < > = values in this interval not displayed.   GFR: Estimated Creatinine Clearance: 8.6 mL/min (A) (by C-G formula based on SCr of 6.09 mg/dL (H)). Liver Function Tests: Recent Labs  Lab 02/09/19 0719 02/11/19 1006 02/12/19 0251 02/13/19 0249  AST 23 32 32 33  ALT 24 35 38 43  ALKPHOS 78 74 80 78  BILITOT 0.4 0.4 0.8 0.6  PROT 5.1* 5.5* 5.8* 5.3*  ALBUMIN 2.5* 2.3* 2.6* 2.5*   No results for input(s): LIPASE, AMYLASE in the last 168 hours. No results for input(s): AMMONIA in the last 168 hours. Coagulation Profile: No results for input(s): INR, PROTIME in the last 168 hours. Cardiac Enzymes: No results for input(s): CKTOTAL, CKMB, CKMBINDEX, TROPONINI in the last 168 hours. BNP (last 3 results) No results for input(s): PROBNP in the last 8760 hours. HbA1C: No results for input(s): HGBA1C in the last 72 hours. CBG: Recent Labs  Lab 02/13/19 0028 02/13/19 0430 02/13/19 0836 02/13/19 1123 02/13/19 1652  GLUCAP 241* 162* 105* 211* 196*   Lipid Profile: No results for input(s): CHOL, HDL, LDLCALC, TRIG, CHOLHDL, LDLDIRECT in the last 72 hours. Thyroid Function Tests: No results for input(s): TSH, T4TOTAL, FREET4, T3FREE, THYROIDAB in the last 72 hours. Anemia Panel: Recent Labs    02/12/19 0251 02/13/19 0249  FERRITIN 684* 524*   Sepsis Labs: Recent Labs  Lab 02/07/19 1059 02/07/19 1434 02/10/19 1033 02/10/19 1500  LATICACIDVEN  0.8 0.7 1.0 1.2    Recent Results (from the past 240 hour(s))  SARS CORONAVIRUS 2 (TAT 6-24 HRS) Nasopharyngeal Nasopharyngeal Swab     Status: Abnormal   Collection Time: 02/06/19  3:17 PM   Specimen: Nasopharyngeal Swab  Result Value Ref Range Status   SARS Coronavirus 2 POSITIVE (A) NEGATIVE Final    Comment: RESULT CALLED TO, READ BACK  BY AND VERIFIED WITH: J.ROMAS RN 2046 02/06/19 MCCORMICK K (NOTE) SARS-CoV-2 target nucleic acids are DETECTED. The SARS-CoV-2 RNA is generally detectable in upper and lower respiratory specimens during the acute phase of infection. Positive results are indicative of the presence of SARS-CoV-2 RNA. Clinical correlation with patient history and other diagnostic information is  necessary to determine patient infection status. Positive results do not rule out bacterial infection or co-infection with other viruses.  The expected result is Negative. Fact Sheet for Patients: SugarRoll.be Fact Sheet for Healthcare Providers: https://www.woods-mathews.com/ This test is not yet approved or cleared by the [place] FDA and  has been authorized for detection and/or diagnosis of SARS-CoV-2 by FDA under an Emergency Use Authorization (EUA). This EUA will remain  in effect (meaning this test can be used) for th e duration of the COVID-19 declaration under Section 564(b)(1) of the Act, 21 U.S.C. section 360bbb-3(b)(1), unless the authorization is terminated or revoked sooner. Performed at [facility], [place] [address] [place], [place] [address]   MRSA PCR Screening     Status: None   Collection Time: 02/07/19  2:48 AM   Specimen: Nasal Mucosa; Nasopharyngeal  Result Value Ref Range Status   MRSA by PCR NEGATIVE NEGATIVE Final    Comment:        The GeneXpert MRSA Assay (FDA approved for NASAL specimens only), is one component of a comprehensive MRSA colonization surveillance program. It is not intended  to diagnose MRSA infection nor to guide or monitor treatment for MRSA infections. Performed at [facility], [place] [address] [place], [place] [address]   Culture, blood (routine x 2)     Status: None (Preliminary result)   Collection Time: 02/10/19 10:33 AM   Specimen: BLOOD  Result Value Ref Range Status   Specimen Description BLOOD RIGHT ANTECUBITAL  Final   Special Requests AEROBIC BOTTLE ONLY Blood Culture adequate volume  Final   Culture   Final    NO GROWTH 2 DAYS Performed at [facility], [place] [address], [place], [place] [address]    Report Status PENDING  Incomplete  Culture, blood (routine x 2)     Status: None (Preliminary result)   Collection Time: 02/10/19 10:33 AM   Specimen: BLOOD  Result Value Ref Range Status   Specimen Description BLOOD RIGHT ANTECUBITAL  Final   Special Requests   Final    BOTTLES DRAWN AEROBIC AND ANAEROBIC Blood Culture adequate volume   Culture   Final    NO GROWTH 2 DAYS Performed at [facility], [place] [address], [place], [place] [address]    Report Status PENDING  Incomplete    Radiology Studies: DG CHEST PORT 1 VIEW  Result Date: 02/12/2019 CLINICAL DATA:  Shortness of breath. COVID-19 virus infection. EXAM: PORTABLE CHEST 1 VIEW COMPARISON:  02/06/2019 FINDINGS: The heart size and mediastinal contours are within normal limits. Aortic atherosclerosis incidentally noted. Chronic pulmonary interstitial prominence is unchanged. Mild bilateral upper lobe scarring is also stable. No evidence of acute infiltrate or pleural effusion. Previously seen left lower lobe infiltrate has resolved since prior exam. Cardiac loop recorder is again noted. IMPRESSION: No active disease. Electronically Signed   By: Marlaine Hind M.D.   On: 02/12/2019 09:01    Scheduled Meds: . amLODipine  10 mg Oral Daily  . vitamin C  500 mg Oral Daily  . aspirin  325 mg Oral Daily  . atorvastatin  20 mg Oral q1800  .  cilostazol  100 mg Oral BID  .  darbepoetin (ARANESP) injection - NON-DIALYSIS  40 mcg Subcutaneous Q Mon-1800  . dexamethasone (DECADRON) injection  6 mg Intravenous Q24H  . furosemide  40 mg Oral Daily  . gabapentin  100 mg Oral BID  . heparin  5,000 Units Subcutaneous Q8H  . insulin aspart  0-15 Units Subcutaneous Q4H  . insulin aspart  10 Units Subcutaneous TID WC  . insulin detemir  10 Units Subcutaneous BID  . Ipratropium-Albuterol  1 puff Inhalation TID  . pantoprazole  40 mg Oral Daily  . senna-docusate  1 tablet Oral BID  . zinc sulfate  220 mg Oral Daily   Continuous Infusions: . remdesivir 100 mg in NS 100 mL 100 mg (02/13/19 0911)    LOS: 6 days   Kerney Elbe, DO [facility] PAGER is on [place]  If 7PM-7AM, please contact night-coverage [email or web address]

## 2019-02-13 NOTE — Progress Notes (Signed)
Inpatient Diabetes Program Recommendations  AACE/ADA: New Consensus Statement on Inpatient Glycemic Control   Target Ranges:  Prepandial:   less than 140 mg/dL      Peak postprandial:   less than 180 mg/dL (1-2 hours)      Critically ill patients:  140 - 180 mg/dL     Review of Glycemic Control Results for [name], Julie Brewer (MRN [id number]) as of 02/13/2019 12:43  Ref. Range 02/13/2019 08:36 02/13/2019 11:23  Glucose-Capillary Latest Ref Range: 70 - 99 mg/dL 105 (H)  Novolog 10 units given 211 (H)   Diabetes history: DM2 Outpatient Diabetes medications: Lantus 14 units daily, Regular 12 units with breakfast, Regular 5-7 units with supper Current orders for Inpatient glycemic control:Levemir 10 units BID, Novolog 0-15 units Q4H, Novolog 10 units TID with meals; Decadron 6 mg Q24H  Inpatient Diabetes Program Recommendations:   - If steroids are continued, please consider increasing meal coverage to Novolog 13 units TID with meals.  Thanks, Tama Headings RN, MSN, BC-ADM Inpatient Diabetes Coordinator Team Pager [phone] (8a-5p)

## 2019-02-13 NOTE — Progress Notes (Addendum)
[facility] Progress Note   Subjective:    Julie Brewer is a 69 yo F w/ PMH of blindness, CKD4, T2DM, CVA, HTN, PVD admit for COVID pneumonia on hospital day 6  Julie Brewer was examined and evaluated at bedside chair this am. She mentions having no complaints this am. Mentions feeling well and is hoping to be discharged soon. Denies any chest pain, palpitations, dyspnea  Objective Vitals:   02/12/19 1440 02/12/19 2244 02/13/19 0542 02/13/19 1236  BP: (!) 148/57 128/67 (!) 156/63 (!) 151/59  Pulse: 92 90 90 92  Resp: (!) 21 16 19  (!) 24  Temp: 98.2 F (36.8 C) 98.5 F (36.9 C) 98.8 F (37.1 C) (!) 97.3 F (36.3 C)  TempSrc: Oral Oral Oral Oral  SpO2: 97% 91% 91% 94%  Weight:   70.6 kg   Height:       Physical Exam General: NAD, Well-appearing Heart: Tachycardic, regular rhythm, no m/r/g Lungs: CTAB, no rales, no wheezes Abdomen: BS+, NTND, soft Extremities: Peripheral pulses present, No peripheral edema Dialysis Access:  N/A  Additional Objective Labs: Basic Metabolic Panel: Recent Labs  Lab 02/10/19 0446 02/11/19 0500 02/11/19 1006 02/12/19 0251 02/13/19 0249  NA  --  132*  --  131* 128*  K  --  4.8  --  4.8 5.1  CL  --  102  --  100 97*  CO2  --  18*  --  17* 17*  GLUCOSE  --  298*  --  195* 211*  BUN  --  61*  --  74* 91*  CREATININE  --  5.04*  --  5.42* 6.09*  CALCIUM  --  7.8*  --  8.1* 8.0*  PHOS   < >  --  4.1 4.1 4.9*   < > = values in this interval not displayed.   Liver Function Tests: Recent Labs  Lab 02/11/19 1006 02/12/19 0251 02/13/19 0249  AST 32 32 33  ALT 35 38 43  ALKPHOS 74 80 78  BILITOT 0.4 0.8 0.6  PROT 5.5* 5.8* 5.3*  ALBUMIN 2.3* 2.6* 2.5*   No results for input(s): LIPASE, AMYLASE in the last 168 hours. CBC: Recent Labs  Lab 02/06/19 1335 02/07/19 0425 02/09/19 0719 02/09/19 0719 02/10/19 0446 02/10/19 0446 02/11/19 0500 02/12/19 0251 02/13/19 0249  WBC 6.9   < > 5.1   < > 5.7   < > 4.6  13.5* 14.5*  NEUTROABS 5.0  --   --   --   --   --   --  11.9* 12.9*  HGB 9.2*   < > 8.9*   < > 9.0*   < > 9.1* 9.5* 9.3*  HCT 30.1*   < > 28.8*   < > 28.7*   < > 28.7* 29.0* 27.7*  MCV 84.6   < > 82.5  --  81.1  --  79.9* 78.0* 76.7*  PLT 224   < > 196   < > 188   < > 199 219 284   < > = values in this interval not displayed.   Blood Culture    Component Value Date/Time   SDES BLOOD RIGHT ANTECUBITAL 02/10/2019 1033   SDES BLOOD RIGHT ANTECUBITAL 02/10/2019 1033   SPECREQUEST AEROBIC BOTTLE ONLY Blood Culture adequate volume 02/10/2019 1033   SPECREQUEST  02/10/2019 1033    BOTTLES DRAWN AEROBIC AND ANAEROBIC Blood Culture adequate volume   CULT  02/10/2019 1033    NO GROWTH 2 DAYS Performed at [facility], [place] [address], [place], [place] [address]    CULT  02/10/2019 1033    NO GROWTH 2 DAYS Performed at [facility], [place] [address], [place],  [address]    REPTSTATUS PENDING 02/10/2019 1033   REPTSTATUS PENDING 02/10/2019 1033    Cardiac Enzymes: No results for input(s): CKTOTAL, CKMB, CKMBINDEX, TROPONINI in the last 168 hours. CBG: Recent Labs  Lab 02/12/19 1951 02/13/19 0028 02/13/19 0430 02/13/19 0836 02/13/19 1123  GLUCAP 314* 241* 162* 105* 211*   Iron Studies:  Recent Labs    02/13/19 0249  FERRITIN 524*   Studies/Results: DG CHEST PORT 1 VIEW  Result Date: 02/12/2019 CLINICAL DATA:  Shortness of breath. COVID-19 virus infection. EXAM: PORTABLE CHEST 1 VIEW COMPARISON:  02/06/2019 FINDINGS: The heart size and mediastinal contours are within normal limits. Aortic atherosclerosis incidentally noted. Chronic pulmonary interstitial prominence is unchanged. Mild bilateral upper lobe scarring is also stable. No evidence of acute infiltrate or pleural effusion. Previously seen left lower lobe infiltrate has resolved since prior exam. Cardiac loop recorder is again noted. IMPRESSION: No active disease. Electronically Signed   By: Marlaine Hind  M.D.   On: 02/12/2019 09:01   Medications: . remdesivir 100 mg in NS 100 mL 100 mg (02/13/19 0911)   . amLODipine  10 mg Oral Daily  . vitamin C  500 mg Oral Daily  . aspirin  325 mg Oral Daily  . atorvastatin  20 mg Oral q1800  . cilostazol  100 mg Oral BID  . darbepoetin (ARANESP) injection - NON-DIALYSIS  40 mcg Subcutaneous Q Mon-1800  . dexamethasone (DECADRON) injection  6 mg Intravenous Q24H  . furosemide  40 mg Oral Daily  . gabapentin  100 mg Oral BID  . heparin  5,000 Units Subcutaneous Q8H  . insulin aspart  0-15 Units Subcutaneous Q4H  . insulin aspart  10 Units Subcutaneous TID WC  . insulin detemir  10 Units Subcutaneous BID  . Ipratropium-Albuterol  1 puff Inhalation TID  . pantoprazole  40 mg Oral Daily  . senna-docusate  1 tablet Oral BID  . zinc sulfate  220 mg Oral Daily    Dialysis Orders: N/A  Assessment/Plan: Assessment/Plan:  1.AKI on CKD (Baseline BUN 34, Creatinine 3) : Currently on home furosemide and continuing to make urine but Bun/Creatinine worsening. 1/12: FENa 1.3% consistent w/ intrinsic renal disease. May be having progression of chronic renal disease after acute illness w/ COVID. AM labs: K 5.1, Bicarb 17, Bun 91, Creatinine 6.09, Phos 4.9 - Unfortunately renal fx continuing to decline, may need HD soon - Trend renal fx - Supportive care w/ treatment of COVID pneumonia - Avoid nephrotoxic meds   2. Hyponatremia: Worsening hyponatremia w/ Na from 132 ->128. Initially thought to be due to hypervolemia but appear euvolemic this am with worsening hyponatremia. Not on obvious meds that may cause SIADH.  - Serum osm, urine osm, urine na - Fluid restrict  2. COVID pneumonia: Afebrile overnight. Currently on Room Air. On dexamethasone and remdesivir per primary.  3. Recurrent Syncope: Orthostatic vs hypoglycemic episodes. Currently resolved.  4. Hypertension/volume  - BP 156/63 this am.  5. Microcytic Anemia  - Stable at baseline. hgb 9.5.  MCV 78 Iron sat 11%, Ferritin 101 (would expect higher in COVID infeciton). Received Feraheme 1/13 - C/w Weekly Aranesp - Trend cbc   Julie Better, MD PGY-2, [facility] Pager: [phone]  Attending attestation to follow  Patient seen and examined, agree  with above note with above modifications. Clinically looks well and is nonoliguric but BUN and crt cont to rise.  No low BPs recorded and no nephrotoxins so I cannot figure out why her renal function is getting worse..... keep hoping and praying that it will plateau and get Brewer.  Unfortunately are getting close to needing dialysis support.  If cont to trend worse over the weekend may need HD as early as Monday  Julie Parish, MD 02/13/2019

## 2019-02-14 LAB — RENAL FUNCTION PANEL
Albumin: 2.7 g/dL — ABNORMAL LOW (ref 3.5–5.0)
Anion gap: 16 — ABNORMAL HIGH (ref 5–15)
BUN: 103 mg/dL — ABNORMAL HIGH (ref 8–23)
CO2: 16 mmol/L — ABNORMAL LOW (ref 22–32)
Calcium: 7.7 mg/dL — ABNORMAL LOW (ref 8.9–10.3)
Chloride: 92 mmol/L — ABNORMAL LOW (ref 98–111)
Creatinine, Ser: 6.56 mg/dL — ABNORMAL HIGH (ref 0.44–1.00)
GFR calc Af Amer: 7 mL/min — ABNORMAL LOW (ref >60)
GFR calc non Af Amer: 6 mL/min — ABNORMAL LOW (ref >60)
Glucose, Bld: 160 mg/dL — ABNORMAL HIGH (ref 70–99)
Phosphorus: 5.1 mg/dL — ABNORMAL HIGH (ref 2.5–4.6)
Potassium: 5.2 mmol/L — ABNORMAL HIGH (ref 3.5–5.1)
Sodium: 124 mmol/L — ABNORMAL LOW (ref 135–145)

## 2019-02-14 LAB — CBC WITH DIFFERENTIAL/PLATELET
Abs Immature Granulocytes: 0.38 10*3/uL — ABNORMAL HIGH (ref 0.00–0.07)
Basophils Absolute: 0 10*3/uL (ref 0.0–0.1)
Basophils Relative: 0 %
Eosinophils Absolute: 0 10*3/uL (ref 0.0–0.5)
Eosinophils Relative: 0 %
HCT: 32.6 % — ABNORMAL LOW (ref 36.0–46.0)
Hemoglobin: 10.9 g/dL — ABNORMAL LOW (ref 12.0–15.0)
Immature Granulocytes: 3 %
Lymphocytes Relative: 5 %
Lymphs Abs: 0.8 10*3/uL (ref 0.7–4.0)
MCH: 25.5 pg — ABNORMAL LOW (ref 26.0–34.0)
MCHC: 33.4 g/dL (ref 30.0–36.0)
MCV: 76.2 fL — ABNORMAL LOW (ref 80.0–100.0)
Monocytes Absolute: 0.7 10*3/uL (ref 0.1–1.0)
Monocytes Relative: 4 %
Neutro Abs: 13.4 10*3/uL — ABNORMAL HIGH (ref 1.7–7.7)
Neutrophils Relative %: 88 %
Platelets: 353 10*3/uL (ref 150–400)
RBC: 4.28 MIL/uL (ref 3.87–5.11)
RDW: 14.7 % (ref 11.5–15.5)
WBC: 15.2 10*3/uL — ABNORMAL HIGH (ref 4.0–10.5)
nRBC: 0.5 % — ABNORMAL HIGH (ref 0.0–0.2)

## 2019-02-14 LAB — SODIUM, URINE, RANDOM: Sodium, Ur: 22 mmol/L

## 2019-02-14 LAB — SEDIMENTATION RATE: Sed Rate: 40 mm/hr — ABNORMAL HIGH (ref 0–22)

## 2019-02-14 LAB — C-REACTIVE PROTEIN: CRP: 1.2 mg/dL — ABNORMAL HIGH (ref <1.0)

## 2019-02-14 LAB — GLUCOSE, CAPILLARY
Glucose-Capillary: 124 mg/dL — ABNORMAL HIGH (ref 70–99)
Glucose-Capillary: 185 mg/dL — ABNORMAL HIGH (ref 70–99)
Glucose-Capillary: 186 mg/dL — ABNORMAL HIGH (ref 70–99)
Glucose-Capillary: 203 mg/dL — ABNORMAL HIGH (ref 70–99)
Glucose-Capillary: 214 mg/dL — ABNORMAL HIGH (ref 70–99)
Glucose-Capillary: 88 mg/dL (ref 70–99)

## 2019-02-14 LAB — OSMOLALITY, URINE: Osmolality, Ur: 244 mOsm/kg — ABNORMAL LOW (ref 300–900)

## 2019-02-14 LAB — FIBRINOGEN: Fibrinogen: 457 mg/dL (ref 210–475)

## 2019-02-14 LAB — D-DIMER, QUANTITATIVE: D-Dimer, Quant: 1.48 ug/mL-FEU — ABNORMAL HIGH (ref 0.00–0.50)

## 2019-02-14 LAB — FERRITIN: Ferritin: 817 ng/mL — ABNORMAL HIGH (ref 11–307)

## 2019-02-14 LAB — LACTATE DEHYDROGENASE: LDH: 372 U/L — ABNORMAL HIGH (ref 98–192)

## 2019-02-14 MED ORDER — SODIUM BICARBONATE 650 MG PO TABS
1300.0000 mg | ORAL_TABLET | Freq: Two times a day (BID) | ORAL | Status: DC
  Administered 2019-02-14 – 2019-02-20 (×13): 1300 mg via ORAL
  Filled 2019-02-14 (×13): qty 2

## 2019-02-14 MED ORDER — FUROSEMIDE 80 MG PO TABS
80.0000 mg | ORAL_TABLET | Freq: Two times a day (BID) | ORAL | Status: DC
  Administered 2019-02-14 – 2019-02-20 (×12): 80 mg via ORAL
  Filled 2019-02-14 (×12): qty 1

## 2019-02-14 NOTE — Progress Notes (Addendum)
Subjective:  Did not have chemistries checked today unfortunately.  Had great UOP 1950 -  BP is good and no events noted   Objective Vital signs in last 24 hours: Vitals:   02/13/19 1236 02/13/19 2011 02/14/19 0410 02/14/19 0812  BP: (!) 151/59 (!) 143/52 (!) 124/58 (!) 135/59  Pulse: 92 96 93 92  Resp: (!) 24 (!) 25 (!) 23 18  Temp: (!) 97.3 F (36.3 C) 97.6 F (36.4 C) 97.8 F (36.6 C) 98.4 F (36.9 C)  TempSrc: Oral Oral Oral Oral  SpO2: 94%  96% 94%  Weight:      Height:       Weight change:   Intake/Output Summary (Last 24 hours) at 02/14/2019 1034 Last data filed at 02/14/2019 0410 Gross per 24 hour  Intake 300 ml  Output 1250 ml  Net -950 ml    Assessment/ Plan: Pt is a 69 y.o. yo female with blindness, DM, HTN, PAD and CKD 4-  crt around 3- followed by [place] who was admitted on 02/06/2019 with COVID/syncope - has developed some A on CRF that is slow to resolve Assessment/Plan: 1. Renal-  Has CKD at baseline, pretty advanced.  Developed A on CRF in the setting of above.  No nephrotoxins and no obvious BP drops-  U/A indicative of hemodynamic change- min protein and no cells.  As of yesterday was not uremic but I did not like the trend with a BUN of close to 100.  I told her that if it does not turn around soon we may be looking at dialysis this admit.  She and I are both hoping of course we dont need to go there. No labs today , will order as well in the AM 2. HTN/vol-  Seems euvolemic-  On lasix 40 PO BID and amlodipine 10.  No changes today  3. Anemia- fairly stable -  Iron was low, gave feraheme on 1/13-  Will give another dose today .  Also on some low dose darbe 4. Secondary hyperparathyroidism- no recent PTH--  Phos is OK  5. COVID-  Per primary-  Has done well-  On RA   Addendum-  Saw labs-  Kidney function worse as well as sodium  Suspect it is hypervolemic hyponatremia in the setting of worsening renal function but will check urine osm- take results with a grain of  salt as is on lasix-  Will increase lasix  Louis Meckel    Labs: Basic Metabolic Panel: Recent Labs  Lab 02/12/19 0251 02/13/19 0249 02/14/19 0928  NA 131* 128* 124*  K 4.8 5.1 5.2*  CL 100 97* 92*  CO2 17* 17* 16*  GLUCOSE 195* 211* 160*  BUN 74* 91* 103*  CREATININE 5.42* 6.09* 6.56*  CALCIUM 8.1* 8.0* 7.7*  PHOS 4.1 4.9* 5.1*   Liver Function Tests: Recent Labs  Lab 02/11/19 1006 02/12/19 0251 02/13/19 0249  AST 32 32 33  ALT 35 38 43  ALKPHOS 74 80 78  BILITOT 0.4 0.8 0.6  PROT 5.5* 5.8* 5.3*  ALBUMIN 2.3* 2.6* 2.5*   No results for input(s): LIPASE, AMYLASE in the last 168 hours. No results for input(s): AMMONIA in the last 168 hours. CBC: Recent Labs  Lab 02/09/19 0719 02/09/19 0719 02/10/19 0446 02/10/19 0446 02/11/19 0500 02/12/19 0251 02/13/19 0249  WBC 5.1   < > 5.7   < > 4.6 13.5* 14.5*  NEUTROABS  --   --   --   --   --  11.9* 12.9*  HGB 8.9*   < > 9.0*   < > 9.1* 9.5* 9.3*  HCT 28.8*   < > 28.7*   < > 28.7* 29.0* 27.7*  MCV 82.5  --  81.1  --  79.9* 78.0* 76.7*  PLT 196   < > 188   < > 199 219 284   < > = values in this interval not displayed.   Cardiac Enzymes: No results for input(s): CKTOTAL, CKMB, CKMBINDEX, TROPONINI in the last 168 hours. CBG: Recent Labs  Lab 02/13/19 1652 02/13/19 2011 02/14/19 0007 02/14/19 0406 02/14/19 0848  GLUCAP 196* 233* 186* 124* 88    Iron Studies:  Recent Labs    02/13/19 0249  FERRITIN 524*   Studies/Results: No results found. Medications: Infusions: . remdesivir 100 mg in NS 100 mL 100 mg (02/13/19 0911)    Scheduled Medications: . amLODipine  10 mg Oral Daily  . vitamin C  500 mg Oral Daily  . aspirin  325 mg Oral Daily  . atorvastatin  20 mg Oral q1800  . cilostazol  100 mg Oral BID  . darbepoetin (ARANESP) injection - NON-DIALYSIS  40 mcg Subcutaneous Q Mon-1800  . dexamethasone (DECADRON) injection  6 mg Intravenous Q24H  . furosemide  40 mg Oral Daily  . gabapentin   100 mg Oral BID  . heparin  5,000 Units Subcutaneous Q8H  . insulin aspart  0-15 Units Subcutaneous Q4H  . insulin aspart  13 Units Subcutaneous TID WC  . insulin detemir  10 Units Subcutaneous BID  . Ipratropium-Albuterol  1 puff Inhalation TID  . pantoprazole  40 mg Oral Daily  . senna-docusate  1 tablet Oral BID  . zinc sulfate  220 mg Oral Daily    have reviewed scheduled and prn medications.  Physical Exam: Did not DON the PPE today -  I did see pt yesterday and she was alert, walking around in room- no signif edema on lasix 40 PO BID   02/14/2019,10:34 AM  LOS: 7 days

## 2019-02-14 NOTE — Progress Notes (Signed)
PROGRESS NOTE    Julie Brewer  AJG:[id number] DOB: [birth year] DOA: 02/06/2019 PCP: [name], MD   Brief Narrative:  The patient is a 69 year old female with past medical history of hypertension, CKD 4 with proteinuria, type 2 diabetes and follows with endocrinology, tobacco use, hypoglycemic episodes noted in the past, peripheral vascular disease, multiple syncopal episodes in the past with loop recorder placed 06/20/2016 without evidence of arrhythmia and follows with cardiology, who presented to the ED on 1/8 for syncopal episode of unclear etiology.  She was found to be COVID-19 positive on admission.  Also with AKI which had improvement with IV fluid.  Was not started on COVID-19 directed treatment until 1/12 as below.  1/11: Worsened AKI and metabolic acidosis, nephrology consulted started on IV fluids.  1/12: Febrile overnight, requiring O2 2 L/min nasal cannula.  Started on dexamethasone and remdesivir.  AKI worsened, started on bicarb drip.  1/13: She continues to be treated with remdesivir and dexamethasone.  Fevers improved slightly today.  Also started on Combivent and albuterol as needed.  Blood sugars are significantly uncontrolled now given steroid demargination.  1/14: Urine creatinine still continue to rise but she has been diuresed and states that she is had good urine output.  She has been afebrile and chest x-ray is improved.  Inflammatory markers are trending down.  Blood sugars remain uncontrolled and will be making adjustments.  1/15: Patient's respiratory status is stable however she is having worsening renal function.  Nephrology recommending possible dialysis soon and are starting to work her up for her hyponatremia.  Patient has no other complaints at this time.  1/16: Renal function continues to worsen but inflammatory markers have started trending down except ferritin and LDH which are slightly bumped.  Her sodium is also worsened to 124 and potassium is  elevated as well.  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 Sallyanne Kuster, interrogated 02/06/2019 - unremarkable -Carotid ultrasound unremarkable 1/10 -Limited echo unremarkable 1/10 -Now starting steroids, will start higher doses of insulin as below and will adjust  AKI on CKD 4, worsened, COVID-19 GN? 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 with her slight worsening of her renal function but she is nonoliguric -Urine lites calculated to female which was 1.3 consistent with intrinsic renal disease -Nephrology feels that this may be progression of her chronic renal disease after acute illness with Covid -IV fluids been stopped and started furosemide 40 mg IV twice daily yesterday and was quickly transitioned to oral home dose 40 mg p.o. twice daily and this was increased to  80 mg  twice daily per renal -Phosphorus level is now 5.1 -Recommend continuing to trend renal function with supportive care the treatment of Covid pneumonia  -Avoid nephrotoxic medications, contrast dyes, hypotension and renally adjust medications -Continue monitor trend and repeat CMP in a.m. -Nephrology feels that she is not uremic and even though that her renal function is trending worse there is no current indication for hemodialysis: They feel that they want to see a plateau before she can be safely discharged -Patient's BUN and creatinine has now worsened further from 74/5.42 -> 91/6.09 -> 124/5.80  Metabolic Acidosis -Sodium sodium bicarb infusion 150 mEq in D5 75 cc/h per nephro now stopped -Patient's CO2 is now 16, chloride level is 92, and anion gap is 16   Type 2 Diabetes with Hyperglycemia now but likely had hypoglycemia previously -Follows with endocrinology, concern for hypoglycemic unawareness in the past -HA1C 7.1 on 02/07/19 -Does not check blood sugar regularly at home -Outpatient meds:Lantus 14 units daily, Regular 12 u with breakfast, Regular 5-7 u with dinner -Tolerated Lantus 8 units monotherapy well while inpatient and could consider discharging on this regimen when ready -Starting Decadron -> start COVID-19 hyperglycemic order set, starting with sensitive dosing given hypoglycemia and reduced renal function; her Levemir was increased to 10 units twice daily -CBGs have now ranging from 124-233 -NovoLog sliding scale insulin has been increased to moderate and have added 5 units 3 times daily with meals which is now increased to 10 units 3 times daily with meals but will now increase to 13 units TID  SIRS secondary to COVID-19 -Previously no O2 requirement and minimal symptoms therefore has not been treated -1/12 overnight: T max 103 F, tachycardic, tachypneic requiring 2 L/min O2, lactic acid negative -Initial CXR showed "Retrocardiac left lower lobe airspace disease,  suspicious for pneumonia. If clinical signs/symptoms are consistent with pneumonia, recommend chest radiographic follow-up to confirm resolution. Alternatively, chest CT could be performed for further evaluation" -Checked Baseline Inflammatory Markers and Trend Daily:  Recent Labs    02/12/19 0251 02/13/19 0249 02/14/19 0928  DDIMER 1.66* 1.37* 1.48*  FERRITIN 684* 524* 817*  LDH 294* 305* 372*  CRP 4.0* 2.2* 1.2*  -ESR was 50 and now worsened to 60 but now slightly improved and is now 40 -Fibrinogen was 494 -> 455 -> 457 Lab Results  Component Value Date   SARSCOV2NAA POSITIVE (A) 02/06/2019   [place] NEGATIVE 06/20/2018  -SpO2: 99 % O2 Flow Rate (L/min): 2 L/min -Started Treatment with 5 Days of Remdesivir and 10 Days of IV Decadron; Day 4 of Resmdesivir and Day 4/10 of Steroids  -C/w Antitussives with Guaifenesin-Dextromethorphan 10 mL po q4hprn Cough and Chlorpheniramine-Hydrocodone 5 mL po q12hprn -Check Blood Cx x2 and showed NGTD at 4 Days -C/w Zinc 220 mg po Daily and Vitamin C  -Airborne and Contact Precautions -C/w Combivent Scheduled 1 puff IH TID and Albuterol 1-2 puff IH q4hprn -Add Flutter Valve and Incentive Spirometry -Repeat CXR showed "No active disease." -Continue to monitor and trend inflammatory markers and repeat chest x-ray imaging in the a.m. -Daughter had told me the patient was against dialysis altogether however when I spoke to the patient is morning she is open to temporary dialysis if needed   Hypomagnesemia -Improved. Mag Level was 1.8 but was not repeated today for some reason -Continue to Monitor and Replete as Necessary -Repeat Mag Level in the AM   Hyperlipidemia  -C/w Atorvastatin 20 mg po Daily   Hypertension, difficulty to control  in the past -Increased Amlodipine to 10 mg -Discontinued labetalol for concern of hypoglycemia unawareness -BP was 160/62  PVD -Continue Cilostazol 100 mg po BID and with ASA 325 mg po BID and  Atorvastatin 20 mg po qHS -Carotid ultrasound as above  Iron Deficiency Anemia -Anemia panel done recently showed an iron level of 20, U IBC of 155, TIBC 175, saturation ratios of 11%, ferritin level of 101 -Continued iron supplement and received a dose of Ferumoxytol 510 mg  Yesterday per Nephrology and Darbepoetin Alpha 40 mcg on Monday  -Patient's Hb/HCt went from 8.9/28.8 -> 9.0/28.7 -> 9.1/28.7 -> 9.5/29.0 -> 9.3/27.7; Not checked this AM so will add on a CBC -Continue to monitor for signs and symptoms of bleeding; currently no overt bleeding noted -Repeat CBC in AM   Tobacco use with Emphysema -Smokes half pack per day -Tobacco cessation provided  Atraumatic right eyelid swelling, unknown etiology, resolved -Continue eyedrops with Liquifilm Tears 1 drop Right Eye as Needed  GERD -C/w Pantoprazole 40 mg po Daily   Hyponatremia -Continues to worsen -Patient sodium was 132 -> 128 -> 124 -Nephrology is working this up and has ordered serum osmolality (298), urine osmolality (218), as well as urine sodium (20) -Dr. Moshe Cipro of renal suspects that the patient has hypervolemic hyponatremia in the setting of worsening renal function but I will also check a urine osmolality again and she is going to increase the Lasix from 40 mg p.o. twice daily to 80 mg p.o. twice daily  Leukocytosis -Patient's WBC went from 4.6 -> 13.5 -> 14.5 -In the setting of steroid demargination -Continue to monitor for signs and symptoms of infection  -Repeat CBC not done this a.m. so we will have added on another -Repeat CBC in a.m.  Hyperkalemia -In the setting of worsening renal function -Patient potassium is now 5.2  -Continue to monitor and trend and repeat CMP in a.m.  GOC: DNR poA  DVT prophylaxis: Heparin 5,000 units sq q8h  Code Status: DO NOT RESUSCITATE Family Communication: Discussed with Daughter Comptroller) over the Telephone Disposition Plan: Pending Improvement in Respiratory  Status, Renal Fxn, and completion of Remdesivir   Consultants:   Nephrology   Procedures: None   Antimicrobials:  Anti-infectives (From admission, onward)   Start     Dose/Rate Route Frequency Ordered Stop   02/11/19 1000  remdesivir 100 mg in sodium chloride 0.9 % 100 mL IVPB     100 mg 200 mL/hr over 30 Minutes Intravenous Daily 02/10/19 0803 02/14/19 1238   02/10/19 1000  remdesivir 200 mg in sodium chloride 0.9% 250 mL IVPB     200 mg 580 mL/hr over 30 Minutes Intravenous Once 02/10/19 0803 02/10/19 1145     Subjective: Seen and examined at bedside she was sitting in the chair and stated that her IV needed to be seen.  No nausea or vomiting.  States that she is urinating.  No lightheadedness or dizziness.  Hopeful that her kidney function recovers.  No other concerns complaints at this time and is agreeable for short-term hemodialysis if it is necessary.  Objective: Vitals:   02/13/19 2011 02/14/19 0410 02/14/19 0812 02/14/19 1342  BP: (!) 143/52 (!) 124/58 (!) 135/59 (!) 160/62  Pulse: 96 93 92 87  Resp: (!) 25 (!) 23 18 19   Temp: 97.6 F (36.4 C) 97.8 F (36.6 C) 98.4 F (36.9 C) (!) 97.3 F (36.3 C)  TempSrc: Oral Oral Oral Oral  SpO2:  96% 94% 99%  Weight:  Height:        Intake/Output Summary (Last 24 hours) at 02/14/2019 1541 Last data filed at 02/14/2019 1102 Gross per 24 hour  Intake 240 ml  Output 1350 ml  Net -1110 ml   Filed Weights   02/07/19 0251 02/11/19 0527 02/13/19 0542  Weight: 65 kg 72.1 kg 70.6 kg   Examination: Physical Exam:  Constitutional: WN/WD overweight African-American female currently in no acute distress sitting in the chair again at bedside 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 Grossly normal hearing. Mucous membranes are moist.  Neck: Appears normal, supple, no cervical masses, normal ROM, no appreciable thyromegaly; no JVD Respiratory: Diminished to auscultation bilaterally with slightly coarse  breath sounds, no wheezing, rales, rhonchi or crackles. Normal respiratory effort and patient is not tachypenic. No accessory muscle use.  She is unlabored breathing is not wearing any supplemental oxygen via nasal cannula Cardiovascular: RRR, no murmurs / rubs / gallops. S1 and S2 auscultated.  1+ lower extremity edema. 2+ pedal pulses. No carotid bruits.  Abdomen: Soft, non-tender, Distended 2/2 to body habitus. Bowel sounds positive x4.  GU: Deferred. Musculoskeletal: No clubbing / cyanosis of digits/nails. No joint deformity upper and lower extremities. Skin: No rashes, lesions, ulcers on a limited skin evaluation. No induration; Warm and dry.  Neurologic: CN 2-12 grossly intact with no focal deficits. Romberg sign and cerebellar reflexes not assessed.  Psychiatric: Normal judgment and insight. Alert and oriented x 3. Normal mood and appropriate affect.   Data Reviewed: I have personally reviewed following labs and imaging studies  CBC: Recent Labs  Lab 02/09/19 0719 02/10/19 0446 02/11/19 0500 02/12/19 0251 02/13/19 0249  WBC 5.1 5.7 4.6 13.5* 14.5*  NEUTROABS  --   --   --  11.9* 12.9*  HGB 8.9* 9.0* 9.1* 9.5* 9.3*  HCT 28.8* 28.7* 28.7* 29.0* 27.7*  MCV 82.5 81.1 79.9* 78.0* 76.7*  PLT 196 188 199 219 166   Basic Metabolic Panel: Recent Labs  Lab 02/08/19 0250 02/08/19 0250 02/09/19 0719 02/09/19 0719 02/10/19 0446 02/11/19 0500 02/11/19 1006 02/12/19 0251 02/13/19 0249 02/14/19 0928  NA 136   < > 136   < > 134* 132*  --  131* 128* 124*  K 4.7   < > 5.0   < > 4.9 4.8  --  4.8 5.1 5.2*  CL 113*   < > 111   < > 111 102  --  100 97* 92*  CO2 17*   < > 16*   < > 13* 18*  --  17* 17* 16*  GLUCOSE 78   < > 112*   < > 128* 298*  --  195* 211* 160*  BUN 42*   < > 48*   < > 52* 61*  --  74* 91* 103*  CREATININE 3.66*   < > 4.48*   < > 4.66* 5.04*  --  5.42* 6.09* 6.56*  CALCIUM 8.0*   < > 7.8*   < > 7.6* 7.8*  --  8.1* 8.0* 7.7*  MG 1.9  --  1.9  --   --   --  1.8 1.9 1.8   --   PHOS 3.8   < > 4.4   < > 3.9  --  4.1 4.1 4.9* 5.1*   < > = values in this interval not displayed.   GFR: Estimated Creatinine Clearance: 8 mL/min (A) (by C-G formula based on SCr of 6.56 mg/dL (H)). Liver Function Tests: Recent  Labs  Lab 02/09/19 0719 02/11/19 1006 02/12/19 0251 02/13/19 0249 02/14/19 0928  AST 23 32 32 33  --   ALT 24 35 38 43  --   ALKPHOS 78 74 80 78  --   BILITOT 0.4 0.4 0.8 0.6  --   PROT 5.1* 5.5* 5.8* 5.3*  --   ALBUMIN 2.5* 2.3* 2.6* 2.5* 2.7*   No results for input(s): LIPASE, AMYLASE in the last 168 hours. No results for input(s): AMMONIA in the last 168 hours. Coagulation Profile: No results for input(s): INR, PROTIME in the last 168 hours. Cardiac Enzymes: No results for input(s): CKTOTAL, CKMB, CKMBINDEX, TROPONINI in the last 168 hours. BNP (last 3 results) No results for input(s): PROBNP in the last 8760 hours. HbA1C: No results for input(s): HGBA1C in the last 72 hours. CBG: Recent Labs  Lab 02/13/19 2011 02/14/19 0007 02/14/19 0406 02/14/19 0848 02/14/19 1116  GLUCAP 233* 186* 124* 88 185*   Lipid Profile: No results for input(s): CHOL, HDL, LDLCALC, TRIG, CHOLHDL, LDLDIRECT in the last 72 hours. Thyroid Function Tests: No results for input(s): TSH, T4TOTAL, FREET4, T3FREE, THYROIDAB in the last 72 hours. Anemia Panel: Recent Labs    02/13/19 0249 02/14/19 0928  FERRITIN 524* 817*   Sepsis Labs: Recent Labs  Lab 02/10/19 1033 02/10/19 1500  LATICACIDVEN 1.0 1.2    Recent Results (from the past 240 hour(s))  SARS CORONAVIRUS 2 (TAT 6-24 HRS) Nasopharyngeal Nasopharyngeal Swab     Status: Abnormal   Collection Time: 02/06/19  3:17 PM   Specimen: Nasopharyngeal Swab  Result Value Ref Range Status   SARS Coronavirus 2 POSITIVE (A) NEGATIVE Final    Comment: RESULT CALLED TO, READ BACK BY AND VERIFIED WITH: J.ROMAS RN 2046 02/06/19 MCCORMICK K (NOTE) SARS-CoV-2 target nucleic acids are DETECTED. The SARS-CoV-2  RNA is generally detectable in upper and lower respiratory specimens during the acute phase of infection. Positive results are indicative of the presence of SARS-CoV-2 RNA. Clinical correlation with patient history and other diagnostic information is  necessary to determine patient infection status. Positive results do not rule out bacterial infection or co-infection with other viruses.  The expected result is Negative. Fact Sheet for Patients: SugarRoll.be Fact Sheet for Healthcare Providers: https://www.woods-mathews.com/ This test is not yet approved or cleared by the [place] FDA and  has been authorized for detection and/or diagnosis of SARS-CoV-2 by FDA under an Emergency Use Authorization (EUA). This EUA will remain  in effect (meaning this test can be used) for th e duration of the COVID-19 declaration under Section 564(b)(1) of the Act, 21 U.S.C. section 360bbb-3(b)(1), unless the authorization is terminated or revoked sooner. Performed at [facility], [place] [address] [place], [place] [address]   MRSA PCR Screening     Status: None   Collection Time: 02/07/19  2:48 AM   Specimen: Nasal Mucosa; Nasopharyngeal  Result Value Ref Range Status   MRSA by PCR NEGATIVE NEGATIVE Final    Comment:        The GeneXpert MRSA Assay (FDA approved for NASAL specimens only), is one component of a comprehensive MRSA colonization surveillance program. It is not intended to diagnose MRSA infection nor to guide or monitor treatment for MRSA infections. Performed at [facility], [place] [address] [place], [place] [address]   Culture, blood (routine x 2)     Status: None (Preliminary result)   Collection Time: 02/10/19 10:33 AM   Specimen: BLOOD  Result Value Ref  Range Status   Specimen Description BLOOD RIGHT ANTECUBITAL  Final   Special Requests AEROBIC BOTTLE ONLY Blood Culture adequate volume  Final   Culture   Final    NO  GROWTH 4 DAYS Performed at [facility], [address], [place], [place] [address]    Report Status PENDING  Incomplete  Culture, blood (routine x 2)     Status: None (Preliminary result)   Collection Time: 02/10/19 10:33 AM   Specimen: BLOOD  Result Value Ref Range Status   Specimen Description BLOOD RIGHT ANTECUBITAL  Final   Special Requests   Final    BOTTLES DRAWN AEROBIC AND ANAEROBIC Blood Culture adequate volume   Culture   Final    NO GROWTH 4 DAYS Performed at [facility], [place] [address], [place], [place] [address]    Report Status PENDING  Incomplete    Radiology Studies: No results found.  Scheduled Meds: . amLODipine  10 mg Oral Daily  . vitamin C  500 mg Oral Daily  . aspirin  325 mg Oral Daily  . atorvastatin  20 mg Oral q1800  . cilostazol  100 mg Oral BID  . darbepoetin (ARANESP) injection - NON-DIALYSIS  40 mcg Subcutaneous Q Mon-1800  . dexamethasone (DECADRON) injection  6 mg Intravenous Q24H  . furosemide  80 mg Oral BID  . gabapentin  100 mg Oral BID  . heparin  5,000 Units Subcutaneous Q8H  . insulin aspart  0-15 Units Subcutaneous Q4H  . insulin aspart  13 Units Subcutaneous TID WC  . insulin detemir  10 Units Subcutaneous BID  . Ipratropium-Albuterol  1 puff Inhalation TID  . pantoprazole  40 mg Oral Daily  . senna-docusate  1 tablet Oral BID  . sodium bicarbonate  1,300 mg Oral BID  . zinc sulfate  220 mg Oral Daily   Continuous Infusions:   LOS: 7 days   Kerney Elbe, DO [facility] PAGER is on [place]  If 7PM-7AM, please contact night-coverage [email or web address]

## 2019-02-15 LAB — RENAL FUNCTION PANEL
Albumin: 2.6 g/dL — ABNORMAL LOW (ref 3.5–5.0)
Anion gap: 14 (ref 5–15)
BUN: 112 mg/dL — ABNORMAL HIGH (ref 8–23)
CO2: 17 mmol/L — ABNORMAL LOW (ref 22–32)
Calcium: 7.7 mg/dL — ABNORMAL LOW (ref 8.9–10.3)
Chloride: 95 mmol/L — ABNORMAL LOW (ref 98–111)
Creatinine, Ser: 6.46 mg/dL — ABNORMAL HIGH (ref 0.44–1.00)
GFR calc Af Amer: 7 mL/min — ABNORMAL LOW (ref >60)
GFR calc non Af Amer: 6 mL/min — ABNORMAL LOW (ref >60)
Glucose, Bld: 56 mg/dL — ABNORMAL LOW (ref 70–99)
Phosphorus: 5 mg/dL — ABNORMAL HIGH (ref 2.5–4.6)
Potassium: 5 mmol/L (ref 3.5–5.1)
Sodium: 126 mmol/L — ABNORMAL LOW (ref 135–145)

## 2019-02-15 LAB — CBC WITH DIFFERENTIAL/PLATELET
Abs Immature Granulocytes: 0.55 10*3/uL — ABNORMAL HIGH (ref 0.00–0.07)
Basophils Absolute: 0.1 10*3/uL (ref 0.0–0.1)
Basophils Relative: 0 %
Eosinophils Absolute: 0 10*3/uL (ref 0.0–0.5)
Eosinophils Relative: 0 %
HCT: 29.7 % — ABNORMAL LOW (ref 36.0–46.0)
Hemoglobin: 10.4 g/dL — ABNORMAL LOW (ref 12.0–15.0)
Immature Granulocytes: 3 %
Lymphocytes Relative: 6 %
Lymphs Abs: 0.9 10*3/uL (ref 0.7–4.0)
MCH: 26.1 pg (ref 26.0–34.0)
MCHC: 35 g/dL (ref 30.0–36.0)
MCV: 74.6 fL — ABNORMAL LOW (ref 80.0–100.0)
Monocytes Absolute: 1.2 10*3/uL — ABNORMAL HIGH (ref 0.1–1.0)
Monocytes Relative: 7 %
Neutro Abs: 14.4 10*3/uL — ABNORMAL HIGH (ref 1.7–7.7)
Neutrophils Relative %: 84 %
Platelets: 355 10*3/uL (ref 150–400)
RBC: 3.98 MIL/uL (ref 3.87–5.11)
RDW: 14.3 % (ref 11.5–15.5)
WBC: 17.1 10*3/uL — ABNORMAL HIGH (ref 4.0–10.5)
nRBC: 0.6 % — ABNORMAL HIGH (ref 0.0–0.2)

## 2019-02-15 LAB — GLUCOSE, CAPILLARY
Glucose-Capillary: 112 mg/dL — ABNORMAL HIGH (ref 70–99)
Glucose-Capillary: 119 mg/dL — ABNORMAL HIGH (ref 70–99)
Glucose-Capillary: 157 mg/dL — ABNORMAL HIGH (ref 70–99)
Glucose-Capillary: 170 mg/dL — ABNORMAL HIGH (ref 70–99)
Glucose-Capillary: 187 mg/dL — ABNORMAL HIGH (ref 70–99)
Glucose-Capillary: 51 mg/dL — ABNORMAL LOW (ref 70–99)
Glucose-Capillary: 86 mg/dL (ref 70–99)

## 2019-02-15 LAB — D-DIMER, QUANTITATIVE: D-Dimer, Quant: 1.33 ug/mL-FEU — ABNORMAL HIGH (ref 0.00–0.50)

## 2019-02-15 LAB — C-REACTIVE PROTEIN: CRP: 0.9 mg/dL (ref <1.0)

## 2019-02-15 LAB — CULTURE, BLOOD (ROUTINE X 2)
Culture: NO GROWTH
Culture: NO GROWTH
Special Requests: ADEQUATE
Special Requests: ADEQUATE

## 2019-02-15 LAB — FIBRINOGEN: Fibrinogen: 416 mg/dL (ref 210–475)

## 2019-02-15 LAB — SEDIMENTATION RATE: Sed Rate: 40 mm/hr — ABNORMAL HIGH (ref 0–22)

## 2019-02-15 LAB — FERRITIN: Ferritin: 1021 ng/mL — ABNORMAL HIGH (ref 11–307)

## 2019-02-15 LAB — LACTATE DEHYDROGENASE: LDH: 368 U/L — ABNORMAL HIGH (ref 98–192)

## 2019-02-15 MED ORDER — INSULIN ASPART 100 UNIT/ML ~~LOC~~ SOLN
10.0000 [IU] | Freq: Three times a day (TID) | SUBCUTANEOUS | Status: DC
  Administered 2019-02-15 – 2019-02-16 (×5): 10 [IU] via SUBCUTANEOUS

## 2019-02-15 MED ORDER — INSULIN DETEMIR 100 UNIT/ML ~~LOC~~ SOLN
6.0000 [IU] | Freq: Two times a day (BID) | SUBCUTANEOUS | Status: DC
  Administered 2019-02-15 – 2019-02-18 (×7): 6 [IU] via SUBCUTANEOUS
  Filled 2019-02-15 (×8): qty 0.06

## 2019-02-15 MED ORDER — SODIUM CHLORIDE 0.9 % IV SOLN
510.0000 mg | Freq: Once | INTRAVENOUS | Status: AC
  Administered 2019-02-15: 510 mg via INTRAVENOUS
  Filled 2019-02-15: qty 17

## 2019-02-15 NOTE — Progress Notes (Signed)
PROGRESS NOTE    Julie Brewer  GGE:[id number] DOB: 07-Jan-1951 DOA: 02/06/2019 PCP: [name], MD   Brief Narrative:  The patient is a 69 year old female with past medical history of hypertension, CKD 4 with proteinuria, type 2 diabetes and follows with endocrinology, tobacco use, hypoglycemic episodes noted in the past, peripheral vascular disease, multiple syncopal episodes in the past with loop recorder placed 06/20/2016 without evidence of arrhythmia and follows with cardiology, who presented to the ED on 1/8 for syncopal episode of unclear etiology.  She was found to be COVID-19 positive on admission.  Also with AKI which had improvement with IV fluid.  Was not started on COVID-19 directed treatment until 1/12 as below.  1/11: Worsened AKI and metabolic acidosis, nephrology consulted started on IV fluids.  1/12: Febrile overnight, requiring O2 2 L/min nasal cannula.  Started on dexamethasone and remdesivir.  AKI worsened, started on bicarb drip.  1/13: She continues to be treated with remdesivir and dexamethasone.  Fevers improved slightly today.  Also started on Combivent and albuterol as needed.  Blood sugars are significantly uncontrolled now given steroid demargination.  1/14: Urine creatinine still continue to rise but she has been diuresed and states that she is had good urine output.  She has been afebrile and chest x-ray is improved.  Inflammatory markers are trending down.  Blood sugars remain uncontrolled and will be making adjustments.  1/15: Patient's respiratory status is stable however she is having worsening renal function.  Nephrology recommending possible dialysis soon and are starting to work her up for her hyponatremia.  Patient has no other complaints at this time.  1/16: Renal function continues to worsen but inflammatory markers have started trending down except ferritin and LDH which are slightly bumped.  Her sodium is also worsened to 124 and potassium is  elevated as well.  Nephrology recommending increasing Lasix as she believes this is hypervolemic hyponatremia  1/17: No function has stabilized and slightly improved.  Patient not complaining of any dyspnea now.  Blood sugars were little low this morning so we will adjust insulin regimen further.  Hoping to go home soon.  Patient agreeable to dialysis if needed but no current indications today per nephrology.  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 Sallyanne Kuster, interrogated 02/06/2019 - unremarkable -Carotid ultrasound unremarkable 1/10 -Limited echo unremarkable 1/10 -Now starting steroids, will start higher doses of insulin as below and will adjust  AKI on CKD 4, worsened, COVID-19 GN? 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 with her slight worsening of her renal function but she is  nonoliguric -Urine lites  calculated to female which was 1.3 consistent with intrinsic renal disease -Nephrology feels that this may be progression of her chronic renal disease after acute illness with Covid -IV fluids been stopped and started furosemide 40 mg IV twice daily and was quickly transitioned to oral home dose 40 mg p.o. twice daily and this was increased to 80 mg twice daily per renal yesterday and will be continued today at current dose -Phosphorus level is now 5.0 and down from yesterday is 5.1 -Recommend continuing to trend renal function with supportive care the treatment of Covid pneumonia  -Avoid nephrotoxic medications, contrast dyes, hypotension and renally adjust medications -Continue monitor trend and repeat CMP in a.m. -Nephrology feels that she is not uremic and even though that her renal function is trending worse there is no current indication for hemodialysis: They feel that they want to see a plateau before she can be safely discharged -Patient's BUN and creatinine has now worsened further from 74/5.42 -> 91/6.09 -> 103/6.56 -> 801/6.55  Metabolic Acidosis -Sodium sodium bicarb infusion 150 mEq in D5 75 cc/h per nephro now stopped -Patient's CO2 is now 17, chloride level is 95, anion gap is 14 -Continue with sodium bicarbonate tablets 30 mg p.o. twice daily -Continue to monitor and trend repeat CMP in the a.m.   Type 2 Diabetes with Hyperglycemia now but likely had hypoglycemia previously -Follows with endocrinology, concern for hypoglycemic unawareness in the past -HA1C 7.1 on 02/07/19 -Does not check blood sugar regularly at home -Outpatient meds:Lantus 14 units daily, Regular 12 u with breakfast, Regular 5-7 u with dinner -Tolerated Lantus 8 units monotherapy well while inpatient and could consider discharging on this regimen when ready -Starting Decadron -> start COVID-19 hyperglycemic order set, starting with sensitive dosing given hypoglycemia and reduced renal function; her Levemir was  increased to 10 units twice daily but now decreased back to 6 units twice daily given hypoglycemia -CBGs have now ranging from 51-203 -NovoLog sliding scale insulin has been increased to moderate and will continue this for today but will cut back on mealtime insulin and decrease down to 10 units 3 times daily with meals  SIRS secondary to COVID-19 -Previously no O2 requirement and minimal symptoms therefore has not been treated -1/12 overnight: T max 103 F, tachycardic, tachypneic requiring 2 L/min O2, lactic acid negative -Initial CXR showed "Retrocardiac left lower lobe airspace disease, suspicious for pneumonia. If clinical signs/symptoms are consistent with pneumonia, recommend chest radiographic follow-up to confirm resolution. Alternatively, chest CT could be performed for further evaluation" -Checked Baseline Inflammatory Markers and Trend Daily:  Recent Labs    02/13/19 0249 02/14/19 0928 02/15/19 0351  DDIMER 1.37* 1.48* 1.33*  FERRITIN 524* 817* 1,021*  LDH 305* 372* 368*  CRP 2.2* 1.2* 0.9  -ESR was 50 and now worsened to 60 but now slightly improved and is now 40 still today -Fibrinogen was 494 -> 455 -> 457 -> 416 Lab Results  Component Value Date   SARSCOV2NAA POSITIVE (A) 02/06/2019   [place] NEGATIVE 06/20/2018  -SpO2: 97 % O2 Flow Rate (L/min): 2 L/min -Started Treatment with 5 Days of Remdesivir and 10 Days of IV Decadron; Day 5 of Resmdesivir and will complete today and Day 5/10 of Steroids  -C/w Antitussives with Guaifenesin-Dextromethorphan 10 mL po q4hprn Cough and Chlorpheniramine-Hydrocodone 5 mL po q12hprn -Check Blood Cx x2 and showed NGTD at 4 Days -C/w Zinc 220 mg po Daily and Vitamin C  -Airborne and Contact Precautions -C/w Combivent  Scheduled 1 puff IH TID and Albuterol 1-2 puff IH q4hprn -Add Flutter Valve and Incentive Spirometry -Repeat CXR showed "No active disease." -Continue to monitor and trend inflammatory markers and repeat chest x-ray  imaging in the a.m. -Is open to temporary hemodialysis if necessary   Hypomagnesemia -Improved. Mag Level was 1.8 but was not repeated today for some reason -Continue to Monitor and Replete as Necessary -Repeat Mag Level in the AM   Hyperlipidemia  -C/w Atorvastatin 20 mg po Daily   Hypertension, difficulty to control in the past -Increased Amlodipine to 10 mg -Continue with furosemide 80 mg p.o. twice daily -Discontinued labetalol for concern of hypoglycemia unawareness -BP was 160/62  PVD -Continue Cilostazol 100 mg po BID and with ASA 325 mg po BID and Atorvastatin 20 mg po qHS -Carotid ultrasound as above  Iron Deficiency Anemia -Anemia panel done recently showed an iron level of 20, U IBC of 155, TIBC 175, saturation ratios of 11%, ferritin level of 101 -Continued iron supplement and received a dose of Ferumoxytol 510 mg  per Nephrology is to receive another dose today and Darbepoetin Alpha 40 mcg on Monday  -Patient's Hb/HCt went from 8.9/28.8 -> 9.0/28.7 -> 9.1/28.7 -> 9.5/29.0 -> 9.3/27.7 -> 10.9/32.6 -> 10.4/29.7 -Continue to monitor for signs and symptoms of bleeding; currently no overt bleeding noted -Repeat CBC in AM   Tobacco use with Emphysema -Smokes half pack per day -Tobacco cessation provided  Atraumatic right eyelid swelling, unknown etiology, resolved -Continue eyedrops with Liquifilm Tears 1 drop Right Eye as Needed  GERD -C/w Pantoprazole 40 mg po Daily   Hyponatremia -Continued to worsen but now improved  -Patient sodium was 132 -> 128 -> 124 -> 126 -Nephrology is working this up and has ordered serum osmolality (298), urine osmolality (218), as well as urine sodium (20) -Dr. Moshe Cipro of renal suspects that the patient has hypervolemic hyponatremia in the setting of worsening renal function but I will also check a urine osmolality again and she is going to increase the Lasix from 40 mg p.o. twice daily to 80 mg p.o. twice daily we will continue  -Continue monitor and trend and repeat CMP in the a.m.  Leukocytosis -Patient's WBC went from 4.6 -> 13.5 -> 14.5 -> 17.1 -In the setting of steroid demargination -Continue to monitor for signs and symptoms of infection  -Repeat CBC not done this a.m. so we will have added on another -Repeat CBC in a.m.  Hyperkalemia -In the setting of worsening renal function -Patient potassium was 5.2 yesterday is now 5.0 this morning -Continue to monitor and trend and repeat CMP in a.m.  GOC: DNR poA  DVT prophylaxis: Heparin 5,000 units sq q8h  Code Status: DO NOT RESUSCITATE Family Communication: Discussed with Daughter Comptroller) over the Telephone Disposition Plan: Pending Improvement in Respiratory Status, Renal Fxn, and completion of Remdesivir; Patient needs   Consultants:   Nephrology   Procedures: None   Antimicrobials:  Anti-infectives (From admission, onward)   Start     Dose/Rate Route Frequency Ordered Stop   02/11/19 1000  remdesivir 100 mg in sodium chloride 0.9 % 100 mL IVPB     100 mg 200 mL/hr over 30 Minutes Intravenous Daily 02/10/19 0803 02/14/19 1238   02/10/19 1000  remdesivir 200 mg in sodium chloride 0.9% 250 mL IVPB     200 mg 580 mL/hr over 30 Minutes Intravenous Once 02/10/19 0803 02/10/19 1145     Subjective: Seen and examined at bedside and was little  frustrated.  Happy that her renal function is a little bit better than yesterday.  No nausea or vomiting.  No chest pain, lightheadedness or dizziness.  No other concerns or complaints at this time.  Objective: Vitals:   02/14/19 1342 02/14/19 2030 02/14/19 2115 02/15/19 0450  BP: (!) 160/62 (!) 181/67 (!) 168/68 (!) 140/55  Pulse: 87 (!) 103  95  Resp: 19 (!) 22 (!) 22 18  Temp: (!) 97.3 F (36.3 C) 98 F (36.7 C)  98.2 F (36.8 C)  TempSrc: Oral Oral  Oral  SpO2: 99% 96%  97%  Weight:    76.1 kg  Height:        Intake/Output Summary (Last 24 hours) at 02/15/2019 0856 Last data filed at  02/14/2019 2034 Gross per 24 hour  Intake 240 ml  Output 950 ml  Net -710 ml   Filed Weights   02/11/19 0527 02/13/19 0542 02/15/19 0450  Weight: 72.1 kg 70.6 kg 76.1 kg   Examination: Physical Exam:  Constitutional: WN/WD overweight African-American female currently frustrated and wanting to go home but is in no acute distress Eyes: Lids and conjunctivae normal, sclerae anicteric  ENMT: External Ears, Nose appear normal. Grossly normal hearing. Mucous membranes are moist.  Neck: Appears normal, supple, no cervical masses, normal ROM, no appreciable thyromegaly; no JVD Respiratory: Diminished to auscultation bilaterally with coarse breath sounds, no wheezing, rales, rhonchi or crackles. Normal respiratory effort and patient is not tachypenic. No accessory muscle use.  Unlabored breathing is not wearing any supplemental oxygen via nasal cannula Cardiovascular: RRR, no murmurs / rubs / gallops. S1 and S2 auscultated. 1+ LE edema bilaterally..  Abdomen: Soft, non-tender, Distended 2/2 to body habitus. Bowel sounds positive x4.  GU: Deferred. Musculoskeletal: No clubbing / cyanosis of digits/nails. No joint deformity upper and lower extremities.  Skin: No rashes, lesions, ulcers on a limited skin evaluation. No induration; Warm and dry.  Neurologic: CN 2-12 grossly intact with no focal deficits. Sensation intact in all 4 Extremities, DTR normal. Strength 5/5 in all 4. Romberg sign cerebellar reflexes not assessed.  Psychiatric: Normal judgment and insight. Alert and oriented x 3. Frustrated mood and appropriate affect.   Data Reviewed: I have personally reviewed following labs and imaging studies  CBC: Recent Labs  Lab 02/10/19 0446 02/11/19 0500 02/12/19 0251 02/13/19 0249 02/14/19 1629  WBC 5.7 4.6 13.5* 14.5* 15.2*  NEUTROABS  --   --  11.9* 12.9* 13.4*  HGB 9.0* 9.1* 9.5* 9.3* 10.9*  HCT 28.7* 28.7* 29.0* 27.7* 32.6*  MCV 81.1 79.9* 78.0* 76.7* 76.2*  PLT 188 199 219 284 128    Basic Metabolic Panel: Recent Labs  Lab 02/09/19 0719 02/10/19 0446 02/11/19 0500 02/11/19 1006 02/12/19 0251 02/13/19 0249 02/14/19 0928 02/15/19 0351  NA 136   < > 132*  --  131* 128* 124* 126*  K 5.0   < > 4.8  --  4.8 5.1 5.2* 5.0  CL 111   < > 102  --  100 97* 92* 95*  CO2 16*   < > 18*  --  17* 17* 16* 17*  GLUCOSE 112*   < > 298*  --  195* 211* 160* 56*  BUN 48*   < > 61*  --  74* 91* 103* 112*  CREATININE 4.48*   < > 5.04*  --  5.42* 6.09* 6.56* 6.46*  CALCIUM 7.8*   < > 7.8*  --  8.1* 8.0* 7.7* 7.7*  MG 1.9  --   --  1.8 1.9 1.8  --   --   PHOS 4.4   < >  --  4.1 4.1 4.9* 5.1* 5.0*   < > = values in this interval not displayed.   GFR: Estimated Creatinine Clearance: 8.4 mL/min (A) (by C-G formula based on SCr of 6.46 mg/dL (H)). Liver Function Tests: Recent Labs  Lab 02/09/19 0719 02/09/19 0719 02/11/19 1006 02/12/19 0251 02/13/19 0249 02/14/19 0928 02/15/19 0351  AST 23  --  32 32 33  --   --   ALT 24  --  35 38 43  --   --   ALKPHOS 78  --  74 80 78  --   --   BILITOT 0.4  --  0.4 0.8 0.6  --   --   PROT 5.1*  --  5.5* 5.8* 5.3*  --   --   ALBUMIN 2.5*   < > 2.3* 2.6* 2.5* 2.7* 2.6*   < > = values in this interval not displayed.   No results for input(s): LIPASE, AMYLASE in the last 168 hours. No results for input(s): AMMONIA in the last 168 hours. Coagulation Profile: No results for input(s): INR, PROTIME in the last 168 hours. Cardiac Enzymes: No results for input(s): CKTOTAL, CKMB, CKMBINDEX, TROPONINI in the last 168 hours. BNP (last 3 results) No results for input(s): PROBNP in the last 8760 hours. HbA1C: No results for input(s): HGBA1C in the last 72 hours. CBG: Recent Labs  Lab 02/14/19 2021 02/15/19 0028 02/15/19 0444 02/15/19 0548 02/15/19 0835  GLUCAP 203* 112* 51* 119* 86   Lipid Profile: No results for input(s): CHOL, HDL, LDLCALC, TRIG, CHOLHDL, LDLDIRECT in the last 72 hours. Thyroid Function Tests: No results for input(s):  TSH, T4TOTAL, FREET4, T3FREE, THYROIDAB in the last 72 hours. Anemia Panel: Recent Labs    02/14/19 0928 02/15/19 0351  FERRITIN 817* 1,021*   Sepsis Labs: Recent Labs  Lab 02/10/19 1033 02/10/19 1500  LATICACIDVEN 1.0 1.2    Recent Results (from the past 240 hour(s))  SARS CORONAVIRUS 2 (TAT 6-24 HRS) Nasopharyngeal Nasopharyngeal Swab     Status: Abnormal   Collection Time: 02/06/19  3:17 PM   Specimen: Nasopharyngeal Swab  Result Value Ref Range Status   SARS Coronavirus 2 POSITIVE (A) NEGATIVE Final    Comment: RESULT CALLED TO, READ BACK BY AND VERIFIED WITH: J.ROMAS RN 2046 02/06/19 MCCORMICK K (NOTE) SARS-CoV-2 target nucleic acids are DETECTED. The SARS-CoV-2 RNA is generally detectable in upper and lower respiratory specimens during the acute phase of infection. Positive results are indicative of the presence of SARS-CoV-2 RNA. Clinical correlation with patient history and other diagnostic information is  necessary to determine patient infection status. Positive results do not rule out bacterial infection or co-infection with other viruses.  The expected result is Negative. Fact Sheet for Patients: SugarRoll.be Fact Sheet for Healthcare Providers: https://www.woods-mathews.com/ This test is not yet approved or cleared by the [place] FDA and  has been authorized for detection and/or diagnosis of SARS-CoV-2 by FDA under an Emergency Use Authorization (EUA). This EUA will remain  in effect (meaning this test can be used) for th e duration of the COVID-19 declaration under Section 564(b)(1) of the Act, 21 U.S.C. section 360bbb-3(b)(1), unless the authorization is terminated or revoked sooner. Performed at [facility], [place] [address] [place], [place] [address]   MRSA PCR Screening     Status: None   Collection Time: 02/07/19  2:48 AM   Specimen:  Nasal Mucosa; Nasopharyngeal  Result Value Ref Range Status    MRSA by PCR NEGATIVE NEGATIVE Final    Comment:        The GeneXpert MRSA Assay (FDA approved for NASAL specimens only), is one component of a comprehensive MRSA colonization surveillance program. It is not intended to diagnose MRSA infection nor to guide or monitor treatment for MRSA infections. Performed at [facility], [place] [address], [place], [place] [address]   Culture, blood (routine x 2)     Status: None   Collection Time: 02/10/19 10:33 AM   Specimen: BLOOD  Result Value Ref Range Status   Specimen Description BLOOD RIGHT ANTECUBITAL  Final   Special Requests AEROBIC BOTTLE ONLY Blood Culture adequate volume  Final   Culture   Final    NO GROWTH 5 DAYS Performed at [facility], [place] [address], [place], [place] [address]    Report Status 02/15/2019 FINAL  Final  Culture, blood (routine x 2)     Status: None   Collection Time: 02/10/19 10:33 AM   Specimen: BLOOD  Result Value Ref Range Status   Specimen Description BLOOD RIGHT ANTECUBITAL  Final   Special Requests   Final    BOTTLES DRAWN AEROBIC AND ANAEROBIC Blood Culture adequate volume   Culture   Final    NO GROWTH 5 DAYS Performed at [facility], [place] [address] [place], [place] [address]    Report Status 02/15/2019 FINAL  Final    Radiology Studies: No results found.  Scheduled Meds: . amLODipine  10 mg Oral Daily  . vitamin C  500 mg Oral Daily  . aspirin  325 mg Oral Daily  . atorvastatin  20 mg Oral q1800  . cilostazol  100 mg Oral BID  . darbepoetin (ARANESP) injection - NON-DIALYSIS  40 mcg Subcutaneous Q Mon-1800  . dexamethasone (DECADRON) injection  6 mg Intravenous Q24H  . furosemide  80 mg Oral BID  . gabapentin  100 mg Oral BID  . heparin  5,000 Units Subcutaneous Q8H  . insulin aspart  0-15 Units Subcutaneous Q4H  . insulin aspart  10 Units Subcutaneous TID WC  . insulin detemir  6 Units Subcutaneous BID  . Ipratropium-Albuterol  1 puff Inhalation TID  . pantoprazole   40 mg Oral Daily  . senna-docusate  1 tablet Oral BID  . sodium bicarbonate  1,300 mg Oral BID  . zinc sulfate  220 mg Oral Daily   Continuous Infusions:   LOS: 8 days   Kerney Elbe, DO [facility] PAGER is on Wing  If 7PM-7AM, please contact night-coverage [email or web address]

## 2019-02-15 NOTE — Progress Notes (Signed)
Subjective:  950 of UOP recorded.  She is not uremic - frustrated at her renal function    Objective Vital signs in last 24 hours: Vitals:   02/14/19 1342 02/14/19 2030 02/14/19 2115 02/15/19 0450  BP: (!) 160/62 (!) 181/67 (!) 168/68 (!) 140/55  Pulse: 87 (!) 103  95  Resp: 19 (!) 22 (!) 22 18  Temp: (!) 97.3 F (36.3 C) 98 F (36.7 C)  98.2 F (36.8 C)  TempSrc: Oral Oral  Oral  SpO2: 99% 96%  97%  Weight:    76.1 kg  Height:       Weight change:   Intake/Output Summary (Last 24 hours) at 02/15/2019 1135 Last data filed at 02/14/2019 2034 Gross per 24 hour  Intake --  Output 550 ml  Net -550 ml    Assessment/ Plan: Pt is a 69 y.o. yo female with blindness, DM, HTN, PAD and CKD 4-  crt around 3- followed by [place] who was admitted on 02/06/2019 with COVID/syncope - has developed some A on CRF that is slow to resolve Assessment/Plan: 1. Renal-  Has CKD at baseline, pretty advanced.  Developed A on CRF in the setting of above.  No nephrotoxins and no obvious BP drops-  U/A indicative of hemodynamic change- min protein and no cells.  Nonoliguric.  Today BUN up but crt down!!  Im hoping that means a plateau.  Is not uremic.  She would consent to dialysis if needed. No absolute need today  2. HTN/vol-  Seemed euvolemic-  But when sodium dropped I increased lasix with good results.  Urine osm in the 200's 3. Anemia- fairly stable -  Iron was low, gave feraheme on 1/13-  Will give another dose today .  Also on some low dose darbe 4. Secondary hyperparathyroidism- no recent PTH--  Phos is OK  5. COVID-  Per primary-  Has done well-  On RA  6. Hyponatremia-  Worsened yesterday-  I increased lasix with good results 7.  Metabolic acidosis -  Now on oral bicarb   Louis Meckel    Labs: Basic Metabolic Panel: Recent Labs  Lab 02/13/19 0249 02/14/19 0928 02/15/19 0351  NA 128* 124* 126*  K 5.1 5.2* 5.0  CL 97* 92* 95*  CO2 17* 16* 17*  GLUCOSE 211* 160* 56*  BUN 91*  103* 112*  CREATININE 6.09* 6.56* 6.46*  CALCIUM 8.0* 7.7* 7.7*  PHOS 4.9* 5.1* 5.0*   Liver Function Tests: Recent Labs  Lab 02/11/19 1006 02/11/19 1006 02/12/19 0251 02/12/19 0251 02/13/19 0249 02/14/19 0928 02/15/19 0351  AST 32  --  32  --  33  --   --   ALT 35  --  38  --  43  --   --   ALKPHOS 74  --  80  --  78  --   --   BILITOT 0.4  --  0.8  --  0.6  --   --   PROT 5.5*  --  5.8*  --  5.3*  --   --   ALBUMIN 2.3*   < > 2.6*   < > 2.5* 2.7* 2.6*   < > = values in this interval not displayed.   No results for input(s): LIPASE, AMYLASE in the last 168 hours. No results for input(s): AMMONIA in the last 168 hours. CBC: Recent Labs  Lab 02/11/19 0500 02/11/19 0500 02/12/19 0251 02/12/19 0251 02/13/19 0249 02/14/19 1629 02/15/19 0915  WBC 4.6   < >  13.5*   < > 14.5* 15.2* 17.1*  NEUTROABS  --   --  11.9*   < > 12.9* 13.4* 14.4*  HGB 9.1*   < > 9.5*   < > 9.3* 10.9* 10.4*  HCT 28.7*   < > 29.0*   < > 27.7* 32.6* 29.7*  MCV 79.9*  --  78.0*  --  76.7* 76.2* 74.6*  PLT 199   < > 219   < > 284 353 355   < > = values in this interval not displayed.   Cardiac Enzymes: No results for input(s): CKTOTAL, CKMB, CKMBINDEX, TROPONINI in the last 168 hours. CBG: Recent Labs  Lab 02/14/19 2021 02/15/19 0028 02/15/19 0444 02/15/19 0548 02/15/19 0835  GLUCAP 203* 112* 51* 119* 86    Iron Studies:  Recent Labs    02/15/19 0351  FERRITIN 1,021*   Studies/Results: No results found. Medications: Infusions:   Scheduled Medications: . amLODipine  10 mg Oral Daily  . vitamin C  500 mg Oral Daily  . aspirin  325 mg Oral Daily  . atorvastatin  20 mg Oral q1800  . cilostazol  100 mg Oral BID  . darbepoetin (ARANESP) injection - NON-DIALYSIS  40 mcg Subcutaneous Q Mon-1800  . dexamethasone (DECADRON) injection  6 mg Intravenous Q24H  . furosemide  80 mg Oral BID  . gabapentin  100 mg Oral BID  . heparin  5,000 Units Subcutaneous Q8H  . insulin aspart  0-15 Units  Subcutaneous Q4H  . insulin aspart  10 Units Subcutaneous TID WC  . insulin detemir  6 Units Subcutaneous BID  . Ipratropium-Albuterol  1 puff Inhalation TID  . pantoprazole  40 mg Oral Daily  . senna-docusate  1 tablet Oral BID  . sodium bicarbonate  1,300 mg Oral BID  . zinc sulfate  220 mg Oral Daily    have reviewed scheduled and prn medications.  Physical Exam: Gen-  NAD- up in bedside chair Lungs-  Mostly clear CV- RRR to tachy  abd- soft, non tender Ext-  Really no edema   02/15/2019,11:35 AM  LOS: 8 days

## 2019-02-16 LAB — FERRITIN: Ferritin: 1141 ng/mL — ABNORMAL HIGH (ref 11–307)

## 2019-02-16 LAB — CBC WITH DIFFERENTIAL/PLATELET
Abs Immature Granulocytes: 0.78 10*3/uL — ABNORMAL HIGH (ref 0.00–0.07)
Basophils Absolute: 0.1 10*3/uL (ref 0.0–0.1)
Basophils Relative: 0 %
Eosinophils Absolute: 0 10*3/uL (ref 0.0–0.5)
Eosinophils Relative: 0 %
HCT: 26.7 % — ABNORMAL LOW (ref 36.0–46.0)
Hemoglobin: 9.2 g/dL — ABNORMAL LOW (ref 12.0–15.0)
Immature Granulocytes: 5 %
Lymphocytes Relative: 6 %
Lymphs Abs: 1 10*3/uL (ref 0.7–4.0)
MCH: 25.8 pg — ABNORMAL LOW (ref 26.0–34.0)
MCHC: 34.5 g/dL (ref 30.0–36.0)
MCV: 75 fL — ABNORMAL LOW (ref 80.0–100.0)
Monocytes Absolute: 1.7 10*3/uL — ABNORMAL HIGH (ref 0.1–1.0)
Monocytes Relative: 11 %
Neutro Abs: 12.8 10*3/uL — ABNORMAL HIGH (ref 1.7–7.7)
Neutrophils Relative %: 78 %
Platelets: 315 10*3/uL (ref 150–400)
RBC: 3.56 MIL/uL — ABNORMAL LOW (ref 3.87–5.11)
RDW: 14.4 % (ref 11.5–15.5)
WBC: 16.4 10*3/uL — ABNORMAL HIGH (ref 4.0–10.5)
nRBC: 0.8 % — ABNORMAL HIGH (ref 0.0–0.2)

## 2019-02-16 LAB — COMPREHENSIVE METABOLIC PANEL
ALT: 75 U/L — ABNORMAL HIGH (ref 0–44)
AST: 53 U/L — ABNORMAL HIGH (ref 15–41)
Albumin: 2.4 g/dL — ABNORMAL LOW (ref 3.5–5.0)
Alkaline Phosphatase: 84 U/L (ref 38–126)
Anion gap: 16 — ABNORMAL HIGH (ref 5–15)
BUN: 112 mg/dL — ABNORMAL HIGH (ref 8–23)
CO2: 16 mmol/L — ABNORMAL LOW (ref 22–32)
Calcium: 7.5 mg/dL — ABNORMAL LOW (ref 8.9–10.3)
Chloride: 93 mmol/L — ABNORMAL LOW (ref 98–111)
Creatinine, Ser: 6.81 mg/dL — ABNORMAL HIGH (ref 0.44–1.00)
GFR calc Af Amer: 7 mL/min — ABNORMAL LOW (ref >60)
GFR calc non Af Amer: 6 mL/min — ABNORMAL LOW (ref >60)
Glucose, Bld: 158 mg/dL — ABNORMAL HIGH (ref 70–99)
Potassium: 4.9 mmol/L (ref 3.5–5.1)
Sodium: 125 mmol/L — ABNORMAL LOW (ref 135–145)
Total Bilirubin: 0.9 mg/dL (ref 0.3–1.2)
Total Protein: 5.1 g/dL — ABNORMAL LOW (ref 6.5–8.1)

## 2019-02-16 LAB — GLUCOSE, CAPILLARY
Glucose-Capillary: 137 mg/dL — ABNORMAL HIGH (ref 70–99)
Glucose-Capillary: 189 mg/dL — ABNORMAL HIGH (ref 70–99)
Glucose-Capillary: 197 mg/dL — ABNORMAL HIGH (ref 70–99)
Glucose-Capillary: 204 mg/dL — ABNORMAL HIGH (ref 70–99)
Glucose-Capillary: 280 mg/dL — ABNORMAL HIGH (ref 70–99)
Glucose-Capillary: 55 mg/dL — ABNORMAL LOW (ref 70–99)
Glucose-Capillary: 77 mg/dL (ref 70–99)
Glucose-Capillary: 96 mg/dL (ref 70–99)

## 2019-02-16 LAB — MAGNESIUM: Magnesium: 1.8 mg/dL (ref 1.7–2.4)

## 2019-02-16 LAB — SEDIMENTATION RATE: Sed Rate: 25 mm/hr — ABNORMAL HIGH (ref 0–22)

## 2019-02-16 LAB — D-DIMER, QUANTITATIVE: D-Dimer, Quant: 1.55 ug/mL-FEU — ABNORMAL HIGH (ref 0.00–0.50)

## 2019-02-16 LAB — PHOSPHORUS: Phosphorus: 6 mg/dL — ABNORMAL HIGH (ref 2.5–4.6)

## 2019-02-16 LAB — LACTATE DEHYDROGENASE: LDH: 376 U/L — ABNORMAL HIGH (ref 98–192)

## 2019-02-16 LAB — C-REACTIVE PROTEIN: CRP: <0.5 mg/dL (ref <1.0)

## 2019-02-16 LAB — FIBRINOGEN: Fibrinogen: 337 mg/dL (ref 210–475)

## 2019-02-16 MED ORDER — INSULIN ASPART 100 UNIT/ML ~~LOC~~ SOLN
0.0000 [IU] | Freq: Three times a day (TID) | SUBCUTANEOUS | Status: DC
  Administered 2019-02-16: 2 [IU] via SUBCUTANEOUS
  Administered 2019-02-17 – 2019-02-18 (×3): 3 [IU] via SUBCUTANEOUS
  Administered 2019-02-18: 2 [IU] via SUBCUTANEOUS
  Administered 2019-02-19: 5 [IU] via SUBCUTANEOUS
  Administered 2019-02-19: 7 [IU] via SUBCUTANEOUS
  Administered 2019-02-20: 2 [IU] via SUBCUTANEOUS
  Administered 2019-02-20: 3 [IU] via SUBCUTANEOUS

## 2019-02-16 MED ORDER — SENNOSIDES-DOCUSATE SODIUM 8.6-50 MG PO TABS
1.0000 | ORAL_TABLET | Freq: Every day | ORAL | Status: DC
  Administered 2019-02-17 – 2019-02-19 (×3): 1 via ORAL
  Filled 2019-02-16 (×3): qty 1

## 2019-02-16 MED ORDER — INSULIN ASPART 100 UNIT/ML ~~LOC~~ SOLN
0.0000 [IU] | Freq: Every day | SUBCUTANEOUS | Status: DC
  Administered 2019-02-17 – 2019-02-19 (×2): 3 [IU] via SUBCUTANEOUS

## 2019-02-16 MED ORDER — INSULIN ASPART 100 UNIT/ML ~~LOC~~ SOLN
7.0000 [IU] | Freq: Three times a day (TID) | SUBCUTANEOUS | Status: DC
  Administered 2019-02-17 – 2019-02-18 (×5): 7 [IU] via SUBCUTANEOUS

## 2019-02-16 NOTE — Progress Notes (Signed)
Subjective:    She wants to go home  SCr up to 6.8, BUN 112, K 4.9, HCO3 16, Na 125  UOP 1.7L  No sig uremia  Objective Vital signs in last 24 hours: Vitals:   02/15/19 0450 02/15/19 1345 02/15/19 2126 02/16/19 0523  BP: (!) 140/55 (!) 158/70 (!) 160/69 (!) 160/69  Pulse: 95 96 94 98  Resp: 18 16 19 18   Temp: 98.2 F (36.8 C) 97.7 F (36.5 C) 97.6 F (36.4 C) 98.1 F (36.7 C)  TempSrc: Oral Oral Oral Oral  SpO2: 97% 94% 94% 99%  Weight: 76.1 kg     Height:       Weight change:   Intake/Output Summary (Last 24 hours) at 02/16/2019 1444 Last data filed at 02/16/2019 0600 Gross per 24 hour  Intake 117 ml  Output 1700 ml  Net -1583 ml    Assessment/ Plan: Pt is a 69 y.o. yo female with blindness, DM, HTN, PAD and CKD 4-  crt around 3- followed by [place] who was admitted on 02/06/2019 with COVID/syncope - has developed some A on CRF that is slow to resolve Assessment/Plan: 1. Renal-  Has CKD at baseline, pretty advanced.  Developed A on CRF in the setting of above.  No nephrotoxins and no obvious BP drops-  U/A indicative of hemodynamic change- min protein and no cells.  Nonoliguric.  No major improvement in labs in past 24h but UOP up. Cont to trend daily labs/UOP. She would consent to dialysis if needed. No absolute need today  2. HTN/vol-  Seemed euvolemic-  Cont BID lasix for now 3. Anemia- fairly stable -  Iron was low, gave feraheme on 1/13 and 1/17Cont ESA 4. Secondary hyperparathyroidism- no recent PTH--  Phos is OK  5. COVID-  Per primary-  Has done well-  On RA  6. Hyponatremia-  CTM, ASx,  7.  Metabolic acidosis -  Cont NaHCO3   Rexene Agent    Labs: Basic Metabolic Panel: Recent Labs  Lab 02/14/19 0928 02/15/19 0351 02/16/19 0639  NA 124* 126* 125*  K 5.2* 5.0 4.9  CL 92* 95* 93*  CO2 16* 17* 16*  GLUCOSE 160* 56* 158*  BUN 103* 112* 112*  CREATININE 6.56* 6.46* 6.81*  CALCIUM 7.7* 7.7* 7.5*  PHOS 5.1* 5.0* 6.0*   Liver Function Tests: Recent  Labs  Lab 02/12/19 0251 02/12/19 0251 02/13/19 0249 02/13/19 0249 02/14/19 0928 02/15/19 0351 02/16/19 0639  AST 32  --  33  --   --   --  53*  ALT 38  --  43  --   --   --  75*  ALKPHOS 80  --  78  --   --   --  84  BILITOT 0.8  --  0.6  --   --   --  0.9  PROT 5.8*  --  5.3*  --   --   --  5.1*  ALBUMIN 2.6*   < > 2.5*   < > 2.7* 2.6* 2.4*   < > = values in this interval not displayed.   No results for input(s): LIPASE, AMYLASE in the last 168 hours. No results for input(s): AMMONIA in the last 168 hours. CBC: Recent Labs  Lab 02/12/19 0251 02/12/19 0251 02/13/19 0249 02/13/19 0249 02/14/19 1629 02/15/19 0915 02/16/19 0639  WBC 13.5*   < > 14.5*   < > 15.2* 17.1* 16.4*  NEUTROABS 11.9*   < > 12.9*   < >  13.4* 14.4* 12.8*  HGB 9.5*   < > 9.3*   < > 10.9* 10.4* 9.2*  HCT 29.0*   < > 27.7*   < > 32.6* 29.7* 26.7*  MCV 78.0*  --  76.7*  --  76.2* 74.6* 75.0*  PLT 219   < > 284   < > 353 355 315   < > = values in this interval not displayed.   Cardiac Enzymes: No results for input(s): CKTOTAL, CKMB, CKMBINDEX, TROPONINI in the last 168 hours. CBG: Recent Labs  Lab 02/16/19 0017 02/16/19 0449 02/16/19 0507 02/16/19 0755 02/16/19 1157  GLUCAP 96 55* 77 137* 204*    Iron Studies:  Recent Labs    02/16/19 0639  FERRITIN 1,141*   Studies/Results: No results found. Medications: Infusions:   Scheduled Medications: . amLODipine  10 mg Oral Daily  . vitamin C  500 mg Oral Daily  . aspirin  325 mg Oral Daily  . atorvastatin  20 mg Oral q1800  . cilostazol  100 mg Oral BID  . darbepoetin (ARANESP) injection - NON-DIALYSIS  40 mcg Subcutaneous Q Mon-1800  . dexamethasone (DECADRON) injection  6 mg Intravenous Q24H  . furosemide  80 mg Oral BID  . gabapentin  100 mg Oral BID  . heparin  5,000 Units Subcutaneous Q8H  . insulin aspart  0-15 Units Subcutaneous Q4H  . insulin aspart  10 Units Subcutaneous TID WC  . insulin detemir  6 Units Subcutaneous BID  .  Ipratropium-Albuterol  1 puff Inhalation TID  . pantoprazole  40 mg Oral Daily  . senna-docusate  1 tablet Oral BID  . sodium bicarbonate  1,300 mg Oral BID  . zinc sulfate  220 mg Oral Daily    have reviewed scheduled and prn medications.  Physical Exam: Gen-  NAD- up in bedside chair Lungs-  Mostly clear CV- RRR to tachy  abd- soft, non tender Ext-  Really no edema   02/16/2019,2:44 PM  LOS: 9 days

## 2019-02-16 NOTE — Progress Notes (Signed)
PROGRESS NOTE    Julie Brewer  OEU:[id number] DOB: January 12, 1951 DOA: 02/06/2019 PCP: [name], MD   Brief Narrative:  The patient is a 69 year old female with past medical history of hypertension, CKD 4 with proteinuria, type 2 diabetes and follows with endocrinology, tobacco use, hypoglycemic episodes noted in the past, peripheral vascular disease, multiple syncopal episodes in the past with loop recorder placed 06/20/2016 without evidence of arrhythmia and follows with cardiology, who presented to the ED on 1/8 for syncopal episode of unclear etiology.  She was found to be COVID-19 positive on admission.  Also with AKI which had improvement with IV fluid.  Was not started on COVID-19 directed treatment until 1/12 as below.  1/11: Worsened AKI and metabolic acidosis, nephrology consulted started on IV fluids.  1/12: Febrile overnight, requiring O2 2 L/min nasal cannula.  Started on dexamethasone and remdesivir.  AKI worsened, started on bicarb drip.  1/13: She continues to be treated with remdesivir and dexamethasone.  Fevers improved slightly today.  Also started on Combivent and albuterol as needed.  Blood sugars are significantly uncontrolled now given steroid demargination.  1/14: Urine creatinine still continue to rise but she has been diuresed and states that she is had good urine output.  She has been afebrile and chest x-ray is improved.  Inflammatory markers are trending down.  Blood sugars remain uncontrolled and will be making adjustments.  1/15: Patient's respiratory status is stable however she is having worsening renal function.  Nephrology recommending possible dialysis soon and are starting to work her up for her hyponatremia.  Patient has no other complaints at this time.  1/16: Renal function continues to worsen but inflammatory markers have started trending down except ferritin and LDH which are slightly bumped.  Her sodium is also worsened to 124 and potassium is  elevated as well.  Nephrology recommending increasing Lasix as she believes this is hypervolemic hyponatremia  1/17: No function has stabilized and slightly improved.  Patient not complaining of any dyspnea now.  Blood sugars were little low this morning so we will adjust insulin regimen further.  Hoping to go home soon.  Patient agreeable to dialysis if needed but no current indications today per nephrology.  Patient did receive another dose of Feraheme per nephrology  1/18: Renal function worsened but she is not significantly uremic and she is still making decent amount of urine that she made 1.7 L.  Nephrology recommending continue to trend labs and urine output daily and starting dialysis if needed but currently no absolute indication.  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 Sallyanne Kuster, interrogated 02/06/2019 - unremarkable -Carotid ultrasound unremarkable 1/10 -Limited echo unremarkable 1/10 -Now starting steroids, Initially started higher doses of insulin as below and will adjust and have decreased to sensitive NovoLog/scale before meals and at bedtime given her recent hypoglycemia  AKI on CKD 4, worsened, COVID-19 GN? 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,  diffuse severe tubulointerstitial scarring and focal  and segmental glomerular tuft scarring, no evidence of immune complex mediated or active glomerulonephritis  -Previously on ACE then ARB for proteinuria but discontinued in the past -Nephrology Consulted: ordered UA, urine sodium and creatinine -Urine Electrolytes calculated to female which was 1.3 consistent with intrinsic renal disease -Nephrology feels that this may be progression of her chronic renal disease after acute illness with Covid -IV fluids been stopped and started furosemide 40 mg IV twice daily and was quickly transitioned to oral home dose 40 mg p.o. twice daily and this was increased to 80 mg twice daily per Renal and will be continued today at current dose -Phosphorus level is now 5.0 and down from yesterday is 5.1 -Recommend continuing to trend renal function with supportive care the treatment of Covid pneumonia  -Avoid nephrotoxic medications, contrast dyes, hypotension and renally adjust medications -Continue monitor trend and repeat CMP in a.m. -Nephrology feels that she is not uremic and even though that her renal function is trending worse there is no current indication for hemodialysis: They feel that they want to see a plateau before she can be safely discharged -Patient's BUN and creatinine has now worsened further from 74/5.42 -> 91/6.09 -> 103/6.56 -> 112/6.46 -> 517/0.01  Metabolic Acidosis -Sodium sodium bicarb infusion 150 mEq in D5 75 cc/h per nephro now stopped -Patient's CO2 is now 16, chloride level is 93, anion gap is 16 -Continue with sodium bicarbonate tablets 30 mg p.o. twice daily -Continue to monitor and trend repeat CMP in the a.m.   Type 2 Diabetes; has had periods of hyperglycemia and hypoglycemia and currently was hypoglycemic last night -Follows with endocrinology, concern for hypoglycemic unawareness in the past -HA1C 7.1 on 02/07/19 -Does not check blood sugar regularly at home -Outpatient meds:Lantus 14 units daily, Regular 12 u with breakfast,  Regular 5-7 u with dinner -Tolerated Lantus 8 units monotherapy well while inpatient and could consider discharging on this regimen when ready -Starting Decadron -> start COVID-19 hyperglycemic order set, starting with sensitive dosing given hypoglycemia and reduced renal function; her Levemir was increased to 10 units twice daily but now decreased back to 6 units twice daily given hypoglycemia -CBGs have now ranging from 55-205 -Change NovoLog moderate sliding scale every 4 hours to sensitive NovoLog sliding scale before meals and at bedtime -We will reduce 10 units subcu 3 times daily with meals down to 7 units 3 times daily with meals  SIRS secondary to COVID-19 -Previously no O2 requirement and minimal symptoms therefore has not been treated -1/12 overnight: T max 103 F, tachycardic, tachypneic requiring 2 L/min O2, lactic acid negative -Initial CXR showed "Retrocardiac left lower lobe airspace disease, suspicious for pneumonia. If clinical signs/symptoms are consistent with pneumonia, recommend chest radiographic follow-up to confirm resolution. Alternatively, chest CT could be performed for further evaluation" -Checked Baseline Inflammatory Markers and Trend Daily:  Recent Labs    02/14/19 0928 02/15/19 0351 02/16/19 0639  DDIMER 1.48* 1.33* 1.55*  FERRITIN 817* 1,021* 1,141*  LDH 372* 368* 376*  CRP 1.2* 0.9 <0.5  -ESR now 25; Peaked to 60  -Fibrinogen was 494 -> 455 -> 457 -> 416 -> 337 Lab Results  Component Value Date   SARSCOV2NAA POSITIVE (A) 02/06/2019   [place] NEGATIVE 06/20/2018  -SpO2: 99 % O2 Flow Rate (L/min): 2 L/min -Started Treatment with 5 Days of Remdesivir and 10 Days of IV Decadron; completed remdesivir is now on day 6 of steroids -C/w Antitussives with Guaifenesin-Dextromethorphan  10 mL po q4hprn Cough and Chlorpheniramine-Hydrocodone 5 mL po q12hprn -Check Blood Cx x2 and showed NGTD at 5 Days -C/w Zinc 220 mg po Daily and Vitamin C  -Airborne and  Contact Precautions -C/w Combivent Scheduled 1 puff IH TID and Albuterol 1-2 puff IH q4hprn -Add Flutter Valve and Incentive Spirometry -Repeat CXR showed "No active disease." -Continue to monitor and trend inflammatory markers and repeat chest x-ray imaging in the a.m. -Is open to temporary hemodialysis if necessary and current no indication for now but she is worsening her renal function and currently not uremic and still having good urinary output   Hypomagnesemia -Improved. Mag Level was 1.8 this AM  -Continue to Monitor and Replete as Necessary -Repeat Mag Level in the AM   Hyperlipidemia  -C/w Atorvastatin 20 mg po Daily   Hypertension, difficulty to control in the past -Increased Amlodipine to 10 mg -Continue with furosemide 80 mg p.o. twice daily -Discontinued labetalol for concern of hypoglycemia unawareness -BP was 136/59  PVD -Continue Cilostazol 100 mg po BID and with ASA 325 mg po BID and Atorvastatin 20 mg po qHS -Carotid ultrasound as above  Iron Deficiency Anemia -Anemia panel done recently showed an iron level of 20, U IBC of 155, TIBC 175, saturation ratios of 11%, ferritin level of 101 -Continued iron supplement and received a dose of Ferumoxytol 510 mg  per Nephrology is to receive another dose today and Darbepoetin Alpha 40 mcg on Monday  -Patient's Hb/HCt went from 8.9/28.8 -> 9.0/28.7 -> 9.1/28.7 -> 9.5/29.0 -> 9.3/27.7 -> 10.9/32.6 -> 10.4/29.7 -> 9.2/26.7 -Continue to monitor for signs and symptoms of bleeding; currently no overt bleeding noted -Repeat CBC in AM   Tobacco use with Emphysema -Smokes half pack per day -Tobacco cessation provided  Atraumatic right eyelid swelling, unknown etiology, resolved -Continue eyedrops with Liquifilm Tears 1 drop Right Eye as Needed  GERD -C/w Pantoprazole 40 mg po Daily   Hyponatremia -Continued to worsen but now improved  -Patient sodium was 132 -> 128 -> 124 -> 126 -> 125 -Nephrology is working this up and  has ordered serum osmolality (298), urine osmolality (218), as well as urine sodium (20) -Dr. Moshe Cipro of renal suspects that the patient has hypervolemic hyponatremia in the setting of worsening renal function but I will also check a urine osmolality again and she is going to increase the Lasix from 40 mg p.o. twice daily to 80 mg p.o. twice daily we will continue per Nephrology Recc's -Continue monitor and trend and repeat CMP in the a.m.  Leukocytosis -Patient's WBC went from 4.6 -> 13.5 -> 14.5 -> 17.1 -> 16.4 -In the setting of steroid demargination -Continue to monitor for signs and symptoms of infection  -Repeat CBC not done this a.m. so we will have added on another -Repeat CBC in a.m.  Hyperkalemia -In the setting of worsening renal function -Patient potassium was 5.2 the day before yesterday is now 4.9 this morning -Continue to monitor and trend and repeat CMP in a.m.  GOC: DNR poA  DVT prophylaxis: Heparin 5,000 units sq q8h  Code Status: DO NOT RESUSCITATE Family Communication: No family present at bedside  Disposition Plan: Pending Improvement in Respiratory Status, Renal Fxn, and completion of Remdesivir; Patient needs   Consultants:   Nephrology   Procedures: None   Antimicrobials:  Anti-infectives (From admission, onward)   Start     Dose/Rate Route Frequency Ordered Stop   02/11/19 1000  remdesivir 100 mg in sodium chloride 0.9 %  100 mL IVPB     100 mg 200 mL/hr over 30 Minutes Intravenous Daily 02/10/19 0803 02/14/19 1238   02/10/19 1000  remdesivir 200 mg in sodium chloride 0.9% 250 mL IVPB     200 mg 580 mL/hr over 30 Minutes Intravenous Once 02/10/19 0803 02/10/19 1145     Subjective: Seen and examined at bedside and still wanting to go home today.  Frustrated about her kidney function.  No lightheadedness or dizziness.  Denies any shortness of breath, nausea or vomiting.  No other concerns complaints at this time and states that the bowel regimen  has helped.  Objective: Vitals:   02/15/19 0450 02/15/19 1345 02/15/19 2126 02/16/19 0523  BP: (!) 140/55 (!) 158/70 (!) 160/69 (!) 160/69  Pulse: 95 96 94 98  Resp: 18 16 19 18   Temp: 98.2 F (36.8 C) 97.7 F (36.5 C) 97.6 F (36.4 C) 98.1 F (36.7 C)  TempSrc: Oral Oral Oral Oral  SpO2: 97% 94% 94% 99%  Weight: 76.1 kg     Height:        Intake/Output Summary (Last 24 hours) at 02/16/2019 0913 Last data filed at 02/16/2019 0600 Gross per 24 hour  Intake 117 ml  Output 1700 ml  Net -1583 ml   Filed Weights   02/11/19 0527 02/13/19 0542 02/15/19 0450  Weight: 72.1 kg 70.6 kg 76.1 kg   Examination: Physical Exam:  Constitutional: WN/WD overweight African-American female who appears a little frustrated and still wants to go home but is in no acute distress and is breathing normally without any supplemental oxygen via nasal cannula Eyes: She is blind  ENMT: External Ears, Nose appear normal. Grossly normal hearing. Mucous membranes are moist.   Neck: Appears normal, supple, no cervical masses, normal ROM, no appreciable thyromegaly; no JVD Respiratory: Diminished to auscultation bilaterally with slightly coarse breath sounds, no wheezing, rales, rhonchi or crackles. Normal respiratory effort and patient is not tachypenic. No accessory muscle use.  Unlabored breathing is not wearing supplemental oxygen via nasal cannula Cardiovascular: RRR, no murmurs / rubs / gallops. S1 and S2 auscultated.  1+ extremity edema.  Abdomen: Soft, non-tender, Distended 2/2 to body habitus. Bowel sounds positive x4.  GU: Deferred. Musculoskeletal: No clubbing / cyanosis of digits/nails. No joint deformity upper and lower extremities.  Skin: No rashes, lesions, ulcers on a limited skin evaluation. No induration; Warm and dry.  Neurologic: CN 2-12 grossly intact with no focal deficits Romberg sign and cerebellar reflexes not assessed.  Psychiatric: Normal judgment and insight. Alert and oriented x 3.  Normal mood and appropriate affect.    Data Reviewed: I have personally reviewed following labs and imaging studies  CBC: Recent Labs  Lab 02/12/19 0251 02/13/19 0249 02/14/19 1629 02/15/19 0915 02/16/19 0639  WBC 13.5* 14.5* 15.2* 17.1* 16.4*  NEUTROABS 11.9* 12.9* 13.4* 14.4* 12.8*  HGB 9.5* 9.3* 10.9* 10.4* 9.2*  HCT 29.0* 27.7* 32.6* 29.7* 26.7*  MCV 78.0* 76.7* 76.2* 74.6* 75.0*  PLT 219 284 353 355 536   Basic Metabolic Panel: Recent Labs  Lab 02/11/19 0500 02/11/19 1006 02/12/19 0251 02/13/19 0249 02/14/19 0928 02/15/19 0351 02/16/19 0639  NA   < >  --  131* 128* 124* 126* 125*  K   < >  --  4.8 5.1 5.2* 5.0 4.9  CL   < >  --  100 97* 92* 95* 93*  CO2   < >  --  17* 17* 16* 17* 16*  GLUCOSE   < >  --  195* 211* 160* 56* 158*  BUN   < >  --  74* 91* 103* 112* 112*  CREATININE   < >  --  5.42* 6.09* 6.56* 6.46* 6.81*  CALCIUM   < >  --  8.1* 8.0* 7.7* 7.7* 7.5*  MG  --  1.8 1.9 1.8  --   --  1.8  PHOS   < > 4.1 4.1 4.9* 5.1* 5.0* 6.0*   < > = values in this interval not displayed.   GFR: Estimated Creatinine Clearance: 7.9 mL/min (A) (by C-G formula based on SCr of 6.81 mg/dL (H)). Liver Function Tests: Recent Labs  Lab 02/11/19 1006 02/11/19 1006 02/12/19 0251 02/13/19 0249 02/14/19 0928 02/15/19 0351 02/16/19 0639  AST 32  --  32 33  --   --  53*  ALT 35  --  38 43  --   --  75*  ALKPHOS 74  --  80 78  --   --  84  BILITOT 0.4  --  0.8 0.6  --   --  0.9  PROT 5.5*  --  5.8* 5.3*  --   --  5.1*  ALBUMIN 2.3*   < > 2.6* 2.5* 2.7* 2.6* 2.4*   < > = values in this interval not displayed.   No results for input(s): LIPASE, AMYLASE in the last 168 hours. No results for input(s): AMMONIA in the last 168 hours. Coagulation Profile: No results for input(s): INR, PROTIME in the last 168 hours. Cardiac Enzymes: No results for input(s): CKTOTAL, CKMB, CKMBINDEX, TROPONINI in the last 168 hours. BNP (last 3 results) No results for input(s): PROBNP in the  last 8760 hours. HbA1C: No results for input(s): HGBA1C in the last 72 hours. CBG: Recent Labs  Lab 02/15/19 2009 02/16/19 0017 02/16/19 0449 02/16/19 0507 02/16/19 0755  GLUCAP 170* 96 55* 77 137*   Lipid Profile: No results for input(s): CHOL, HDL, LDLCALC, TRIG, CHOLHDL, LDLDIRECT in the last 72 hours. Thyroid Function Tests: No results for input(s): TSH, T4TOTAL, FREET4, T3FREE, THYROIDAB in the last 72 hours. Anemia Panel: Recent Labs    02/15/19 0351 02/16/19 0639  FERRITIN 1,021* 1,141*   Sepsis Labs: Recent Labs  Lab 02/10/19 1033 02/10/19 1500  LATICACIDVEN 1.0 1.2    Recent Results (from the past 240 hour(s))  SARS CORONAVIRUS 2 (TAT 6-24 HRS) Nasopharyngeal Nasopharyngeal Swab     Status: Abnormal   Collection Time: 02/06/19  3:17 PM   Specimen: Nasopharyngeal Swab  Result Value Ref Range Status   SARS Coronavirus 2 POSITIVE (A) NEGATIVE Final    Comment: RESULT CALLED TO, READ BACK BY AND VERIFIED WITH: J.ROMAS RN 2046 02/06/19 MCCORMICK K (NOTE) SARS-CoV-2 target nucleic acids are DETECTED. The SARS-CoV-2 RNA is generally detectable in upper and lower respiratory specimens during the acute phase of infection. Positive results are indicative of the presence of SARS-CoV-2 RNA. Clinical correlation with patient history and other diagnostic information is  necessary to determine patient infection status. Positive results do not rule out bacterial infection or co-infection with other viruses.  The expected result is Negative. Fact Sheet for Patients: SugarRoll.be Fact Sheet for Healthcare Providers: https://www.woods-mathews.com/ This test is not yet approved or cleared by the [place] FDA and  has been authorized for detection and/or diagnosis of SARS-CoV-2 by FDA under an Emergency Use Authorization (EUA). This EUA will remain  in effect (meaning this test can be used) for th e duration of the COVID-19  declaration under Section  564(b)(1) of the Act, 21 U.S.C. section 360bbb-3(b)(1), unless the authorization is terminated or revoked sooner. Performed at [facility], [place] [address], [place], [place] [address]   MRSA PCR Screening     Status: None   Collection Time: 02/07/19  2:48 AM   Specimen: Nasal Mucosa; Nasopharyngeal  Result Value Ref Range Status   MRSA by PCR NEGATIVE NEGATIVE Final    Comment:        The GeneXpert MRSA Assay (FDA approved for NASAL specimens only), is one component of a comprehensive MRSA colonization surveillance program. It is not intended to diagnose MRSA infection nor to guide or monitor treatment for MRSA infections. Performed at [facility], [place] [address] [place], [place] [address]   Culture, blood (routine x 2)     Status: None   Collection Time: 02/10/19 10:33 AM   Specimen: BLOOD  Result Value Ref Range Status   Specimen Description BLOOD RIGHT ANTECUBITAL  Final   Special Requests AEROBIC BOTTLE ONLY Blood Culture adequate volume  Final   Culture   Final    NO GROWTH 5 DAYS Performed at [facility], [place] [address] [place], [place] [address]    Report Status 02/15/2019 FINAL  Final  Culture, blood (routine x 2)     Status: None   Collection Time: 02/10/19 10:33 AM   Specimen: BLOOD  Result Value Ref Range Status   Specimen Description BLOOD RIGHT ANTECUBITAL  Final   Special Requests   Final    BOTTLES DRAWN AEROBIC AND ANAEROBIC Blood Culture adequate volume   Culture   Final    NO GROWTH 5 DAYS Performed at [facility], [place] [address], [place],  [address]    Report Status 02/15/2019 FINAL  Final    Radiology Studies: No results found.  Scheduled Meds: . amLODipine  10 mg Oral Daily  . vitamin C  500 mg Oral Daily  . aspirin  325 mg Oral Daily  . atorvastatin  20 mg Oral q1800  . cilostazol  100 mg Oral BID  . darbepoetin (ARANESP) injection - NON-DIALYSIS  40 mcg Subcutaneous Q Mon-1800    . dexamethasone (DECADRON) injection  6 mg Intravenous Q24H  . furosemide  80 mg Oral BID  . gabapentin  100 mg Oral BID  . heparin  5,000 Units Subcutaneous Q8H  . insulin aspart  0-15 Units Subcutaneous Q4H  . insulin aspart  10 Units Subcutaneous TID WC  . insulin detemir  6 Units Subcutaneous BID  . Ipratropium-Albuterol  1 puff Inhalation TID  . pantoprazole  40 mg Oral Daily  . senna-docusate  1 tablet Oral BID  . sodium bicarbonate  1,300 mg Oral BID  . zinc sulfate  220 mg Oral Daily   Continuous Infusions:   LOS: 9 days   Kerney Elbe, DO [facility] PAGER is on AMION  If 7PM-7AM, please contact night-coverage [email or web address]

## 2019-02-17 ENCOUNTER — Inpatient Hospital Stay (HOSPITAL_COMMUNITY): Payer: Medicare Other

## 2019-02-17 LAB — COMPREHENSIVE METABOLIC PANEL
ALT: 98 U/L — ABNORMAL HIGH (ref 0–44)
AST: 53 U/L — ABNORMAL HIGH (ref 15–41)
Albumin: 3.1 g/dL — ABNORMAL LOW (ref 3.5–5.0)
Alkaline Phosphatase: 99 U/L (ref 38–126)
Anion gap: 15 (ref 5–15)
BUN: 116 mg/dL — ABNORMAL HIGH (ref 8–23)
CO2: 19 mmol/L — ABNORMAL LOW (ref 22–32)
Calcium: 8 mg/dL — ABNORMAL LOW (ref 8.9–10.3)
Chloride: 92 mmol/L — ABNORMAL LOW (ref 98–111)
Creatinine, Ser: 6.68 mg/dL — ABNORMAL HIGH (ref 0.44–1.00)
GFR calc Af Amer: 7 mL/min — ABNORMAL LOW (ref >60)
GFR calc non Af Amer: 6 mL/min — ABNORMAL LOW (ref >60)
Glucose, Bld: 107 mg/dL — ABNORMAL HIGH (ref 70–99)
Potassium: 5 mmol/L (ref 3.5–5.1)
Sodium: 126 mmol/L — ABNORMAL LOW (ref 135–145)
Total Bilirubin: 0.9 mg/dL (ref 0.3–1.2)
Total Protein: 6 g/dL — ABNORMAL LOW (ref 6.5–8.1)

## 2019-02-17 LAB — CBC WITH DIFFERENTIAL/PLATELET
Abs Immature Granulocytes: 1.07 10*3/uL — ABNORMAL HIGH (ref 0.00–0.07)
Basophils Absolute: 0.1 10*3/uL (ref 0.0–0.1)
Basophils Relative: 0 %
Eosinophils Absolute: 0 10*3/uL (ref 0.0–0.5)
Eosinophils Relative: 0 %
HCT: 30.6 % — ABNORMAL LOW (ref 36.0–46.0)
Hemoglobin: 10.3 g/dL — ABNORMAL LOW (ref 12.0–15.0)
Immature Granulocytes: 6 %
Lymphocytes Relative: 6 %
Lymphs Abs: 1.1 10*3/uL (ref 0.7–4.0)
MCH: 25.2 pg — ABNORMAL LOW (ref 26.0–34.0)
MCHC: 33.7 g/dL (ref 30.0–36.0)
MCV: 75 fL — ABNORMAL LOW (ref 80.0–100.0)
Monocytes Absolute: 1.7 10*3/uL — ABNORMAL HIGH (ref 0.1–1.0)
Monocytes Relative: 9 %
Neutro Abs: 14.5 10*3/uL — ABNORMAL HIGH (ref 1.7–7.7)
Neutrophils Relative %: 79 %
Platelets: 394 10*3/uL (ref 150–400)
RBC: 4.08 MIL/uL (ref 3.87–5.11)
RDW: 14.2 % (ref 11.5–15.5)
WBC: 18.4 10*3/uL — ABNORMAL HIGH (ref 4.0–10.5)
nRBC: 0.9 % — ABNORMAL HIGH (ref 0.0–0.2)

## 2019-02-17 LAB — FERRITIN: Ferritin: 1489 ng/mL — ABNORMAL HIGH (ref 11–307)

## 2019-02-17 LAB — MAGNESIUM: Magnesium: 2 mg/dL (ref 1.7–2.4)

## 2019-02-17 LAB — GLUCOSE, CAPILLARY
Glucose-Capillary: 104 mg/dL — ABNORMAL HIGH (ref 70–99)
Glucose-Capillary: 208 mg/dL — ABNORMAL HIGH (ref 70–99)
Glucose-Capillary: 222 mg/dL — ABNORMAL HIGH (ref 70–99)
Glucose-Capillary: 289 mg/dL — ABNORMAL HIGH (ref 70–99)

## 2019-02-17 LAB — HEPATITIS PANEL, ACUTE
HCV Ab: NONREACTIVE
Hep A IgM: NONREACTIVE
Hep B C IgM: NONREACTIVE
Hepatitis B Surface Ag: NONREACTIVE

## 2019-02-17 LAB — PHOSPHORUS: Phosphorus: 5.9 mg/dL — ABNORMAL HIGH (ref 2.5–4.6)

## 2019-02-17 LAB — D-DIMER, QUANTITATIVE: D-Dimer, Quant: 1.78 ug/mL-FEU — ABNORMAL HIGH (ref 0.00–0.50)

## 2019-02-17 LAB — LACTATE DEHYDROGENASE: LDH: 489 U/L — ABNORMAL HIGH (ref 98–192)

## 2019-02-17 NOTE — Progress Notes (Signed)
Subjective:    No interval events, called patient daughter on phone during exam, updated  No complaints.  Creatinine 6.68, stable from yesterday, K5.0 and bicarbonate 19.  1.2 L urine output yesterday  Objective Vital signs in last 24 hours: Vitals:   02/17/19 0547 02/17/19 0800 02/17/19 0934 02/17/19 1418  BP: (!) 166/72 (!) 149/70 (!) 149/70 (!) 158/59  Pulse:  (!) 105  98  Resp: (!) 23 15  17   Temp: (!) 97.5 F (36.4 C)   98.1 F (36.7 C)  TempSrc: Oral   Oral  SpO2: 91% 94%  93%  Weight:      Height:       Weight change:   Intake/Output Summary (Last 24 hours) at 02/17/2019 1443 Last data filed at 02/17/2019 0920 Gross per 24 hour  Intake 320 ml  Output 1800 ml  Net -1480 ml    Assessment/ Plan: Pt is a 69 y.o. yo female with blindness, DM, HTN, PAD and CKD 4-  crt around 3- followed by [place] who was admitted on 02/06/2019 with COVID/syncope - has developed some A on CRF that is slow to resolve Assessment/Plan: 1. Renal-  Has CKD at baseline, pretty advanced.  Developed A on CRF in the setting of above.  No nephrotoxins and no obvious BP drops-  U/A indicative of hemodynamic change- min protein and no cells.  Nonoliguric.  Stable labs in the past 24 hours, cont to trend daily labs/UOP.  No immediate indication for dialysis but we need to see this get better, she would consent to dialysis if needed.   2. HTN/vol-  Seemed euvolemic-  Cont BID lasix for now 3. Anemia- fairly stable -  Iron was low, gave feraheme on 1/13 and 1/17 Cont ESA 4. Secondary hyperparathyroidism- no recent PTH--  Phos is OK  5. COVID-  Per primary-  Has done well-  On RA  6. Hyponatremia-  CTM, ASx,  7.  Metabolic acidosis -  Cont NaHCO3   Rexene Agent    Labs: Basic Metabolic Panel: Recent Labs  Lab 02/15/19 0351 02/16/19 0639 02/17/19 0500  NA 126* 125* 126*  K 5.0 4.9 5.0  CL 95* 93* 92*  CO2 17* 16* 19*  GLUCOSE 56* 158* 107*  BUN 112* 112* 116*  CREATININE 6.46* 6.81* 6.68*   CALCIUM 7.7* 7.5* 8.0*  PHOS 5.0* 6.0* 5.9*   Liver Function Tests: Recent Labs  Lab 02/13/19 0249 02/14/19 0928 02/15/19 0351 02/16/19 0639 02/17/19 0500  AST 33  --   --  53* 53*  ALT 43  --   --  75* 98*  ALKPHOS 78  --   --  84 99  BILITOT 0.6  --   --  0.9 0.9  PROT 5.3*  --   --  5.1* 6.0*  ALBUMIN 2.5*   < > 2.6* 2.4* 3.1*   < > = values in this interval not displayed.   No results for input(s): LIPASE, AMYLASE in the last 168 hours. No results for input(s): AMMONIA in the last 168 hours. CBC: Recent Labs  Lab 02/13/19 0249 02/13/19 0249 02/14/19 1629 02/14/19 1629 02/15/19 0915 02/16/19 0639 02/17/19 0500  WBC 14.5*   < > 15.2*   < > 17.1* 16.4* 18.4*  NEUTROABS 12.9*   < > 13.4*   < > 14.4* 12.8* 14.5*  HGB 9.3*   < > 10.9*   < > 10.4* 9.2* 10.3*  HCT 27.7*   < > 32.6*   < >  29.7* 26.7* 30.6*  MCV 76.7*  --  76.2*  --  74.6* 75.0* 75.0*  PLT 284   < > 353   < > 355 315 394   < > = values in this interval not displayed.   Cardiac Enzymes: No results for input(s): CKTOTAL, CKMB, CKMBINDEX, TROPONINI in the last 168 hours. CBG: Recent Labs  Lab 02/16/19 1713 02/16/19 1958 02/16/19 2241 02/17/19 0811 02/17/19 1221  GLUCAP 189* 280* 197* 104* 208*    Iron Studies:  Recent Labs    02/17/19 1059  FERRITIN 1,489*   Studies/Results: No results found. Medications: Infusions:   Scheduled Medications: . amLODipine  10 mg Oral Daily  . vitamin C  500 mg Oral Daily  . aspirin  325 mg Oral Daily  . atorvastatin  20 mg Oral q1800  . cilostazol  100 mg Oral BID  . darbepoetin (ARANESP) injection - NON-DIALYSIS  40 mcg Subcutaneous Q Mon-1800  . dexamethasone (DECADRON) injection  6 mg Intravenous Q24H  . furosemide  80 mg Oral BID  . gabapentin  100 mg Oral BID  . heparin  5,000 Units Subcutaneous Q8H  . insulin aspart  0-5 Units Subcutaneous QHS  . insulin aspart  0-9 Units Subcutaneous TID WC  . insulin aspart  7 Units Subcutaneous TID WC  .  insulin detemir  6 Units Subcutaneous BID  . pantoprazole  40 mg Oral Daily  . senna-docusate  1 tablet Oral QHS  . sodium bicarbonate  1,300 mg Oral BID  . zinc sulfate  220 mg Oral Daily    have reviewed scheduled and prn medications.  Physical Exam: Gen-  NAD- up in bedside chair Lungs-  Mostly clear CV- RRR to tachy  abd- soft, non tender Ext-  Really no edema   02/17/2019,2:43 PM  LOS: 10 days

## 2019-02-17 NOTE — Progress Notes (Signed)
PROGRESS NOTE    Julie Brewer  RXV:[id number] DOB: May 11, 1950 DOA: 02/06/2019 PCP: [name], MD   Brief Narrative:  The patient is a 69 year old female with past medical history of hypertension, CKD 4 with proteinuria, type 2 diabetes and follows with endocrinology, tobacco use, hypoglycemic episodes noted in the past, peripheral vascular disease, multiple syncopal episodes in the past with loop recorder placed 06/20/2016 without evidence of arrhythmia and follows with cardiology, who presented to the ED on 1/8 for syncopal episode of unclear etiology.  She was found to be COVID-19 positive on admission.  Also with AKI which had improvement with IV fluid.  Was not started on COVID-19 directed treatment until 1/12 as below.  1/11: Worsened AKI and metabolic acidosis, nephrology consulted started on IV fluids.  1/12: Febrile overnight, requiring O2 2 L/min nasal cannula.  Started on dexamethasone and remdesivir.  AKI worsened, started on bicarb drip.  1/13: She continues to be treated with remdesivir and dexamethasone.  Fevers improved slightly today.  Also started on Combivent and albuterol as needed.  Blood sugars are significantly uncontrolled now given steroid demargination.  1/14: Urine creatinine still continue to rise but she has been diuresed and states that she is had good urine output.  She has been afebrile and chest x-ray is improved.  Inflammatory markers are trending down.  Blood sugars remain uncontrolled and will be making adjustments.  1/15: Patient's respiratory status is stable however she is having worsening renal function.  Nephrology recommending possible dialysis soon and are starting to work her up for her hyponatremia.  Patient has no other complaints at this time.  1/16: Renal function continues to worsen but inflammatory markers have started trending down except ferritin and LDH which are slightly bumped.  Her sodium is also worsened to 124 and potassium is  elevated as well.  Nephrology recommending increasing Lasix as she believes this is hypervolemic hyponatremia  1/17: No function has stabilized and slightly improved.  Patient not complaining of any dyspnea now.  Blood sugars were little low this morning so we will adjust insulin regimen further.  Hoping to go home soon.  Patient agreeable to dialysis if needed but no current indications today per nephrology.  Patient did receive another dose of Feraheme per nephrology  1/18: Renal function worsened but she is not significantly uremic and she is still making decent amount of urine that she made 1.7 L.  Nephrology recommending continue to trend labs and urine output daily and starting dialysis if needed but currently no absolute indication.  1/19: No current indication for dialysis however nephrology wants her renal function to be improved prior to discharge.  Patient still very frustrated patient's inflammatory markers have improved however slightly her LDH, ferritin and ESR slightly worsened.  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 Sallyanne Kuster, interrogated 02/06/2019 - unremarkable -Carotid ultrasound unremarkable 1/10 -  Limited echo unremarkable 1/10 -Now starting steroids, Initially started higher doses of insulin as below and will  adjust and have decreased to sensitive NovoLog/scale before meals and at bedtime given her recent hypoglycemia  AKI on CKD 4, worsened, COVID-19 GN? 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, diffuse severe tubulointerstitial scarring and focal and segmental glomerular tuft scarring, no evidence of immune complex mediated or active glomerulonephritis  -Previously on ACE then ARB for proteinuria but discontinued in the past -Nephrology Consulted: ordered UA, urine sodium and creatinine -Urine Electrolytes calculated to female which was 1.3 consistent with intrinsic renal disease -Nephrology feels that this may be progression of her chronic renal disease after acute illness with Covid -IV fluids been stopped and started furosemide 40 mg IV twice daily and was quickly transitioned to oral home dose 40 mg p.o. twice daily and this was increased to 80 mg twice daily per Renal and will be continued today at current dose; Patient is fluid restricted to 1500 mL -Phosphorus level is now 5.9 -Recommend continuing to trend renal function with supportive care the treatment of Covid pneumonia  -Avoid nephrotoxic medications, contrast dyes, hypotension and renally adjust medications -Continue monitor trend and repeat CMP in a.m. -Nephrology feels that she is not uremic and even though that her renal function is trending worse there is no current indication for hemodialysis: They feel that they want to see a plateau before she can be safely discharged and want her renal function somewhat improved -Patient's BUN/Creatinine has now worsened further from 74/5.42 -> 91/6.09 -> 103/6.56 -> 112/6.46 -> 112/6.81; Now mildly improved and is stable at 751/7.00  Metabolic Acidosis -Sodium sodium bicarb infusion 150 mEq in D5 75 cc/h per nephro now stopped -Patient's CO2 is now 19, chloride  level is 92, anion gap is 15 -Continue with sodium bicarbonate tablets 1300 mg p.o. twice daily -Continue to monitor and trend repeat CMP in the a.m.   Type 2 Diabetes; has had periods of hyperglycemia and hypoglycemia and currently was hypoglycemic last night -Follows with endocrinology, concern for hypoglycemic unawareness in the past -HA1C 7.1 on 02/07/19 -Does not check blood sugar regularly at home -Outpatient meds:Lantus 14 units daily, Regular 12 u with breakfast, Regular 5-7 u with dinner -Tolerated Lantus 8 units monotherapy well while inpatient and could consider discharging on this regimen when ready -Starting Decadron -> start COVID-19 hyperglycemic order set, starting with sensitive dosing given hypoglycemia and reduced renal function; her Levemir was increased to 10 units twice daily but now decreased back to 6 units twice daily given hypoglycemia -CBGs have now ranging from 96-208 -Changed NovoLog moderate sliding scale every 4 hours to sensitive NovoLog sliding scale before meals and at bedtime -We will reduce 10 units subcu 3 times daily with meals down to 7 units 3 times daily with meals -Reduction in insulin requirements has improved her blood sugars she did not have a hypoglycemic episode since  SIRS secondary to COVID-19 -Previously no O2 requirement and minimal symptoms therefore has not been treated -1/12 overnight: T max 103 F, tachycardic, tachypneic requiring 2 L/min O2, lactic acid negative -Initial CXR showed "Retrocardiac left lower lobe airspace disease, suspicious for pneumonia. If clinical signs/symptoms are consistent with pneumonia, recommend chest radiographic follow-up to confirm resolution. Alternatively, chest CT could be performed for further evaluation" -Checked Baseline Inflammatory Markers and Trend Daily:  Recent Labs    02/15/19 0351 02/16/19 0639 02/17/19 1059  DDIMER 1.33* 1.55* 1.78*  FERRITIN 1,021* 1,141* 1,489*  LDH 368* 376* 489*  CRP  0.9 <0.5  --   -ESR now 25; Peaked to 60 was not rechecked today -Fibrinogen was 494 -> 455 -> 457 -> 416 -> 337 -Her D-dimer, ferritin level and LDH have all started trending upwards Lab Results  Component Value Date   SARSCOV2NAA POSITIVE (A) 02/06/2019   [place] NEGATIVE 06/20/2018  -SpO2: 93 % O2 Flow Rate (L/min): 2 L/min; she is no longer requiring oxygen at this time but is coughing -Started Treatment with 5 Days of Remdesivir and 10 Days of IV Decadron; completed remdesivir is now on day 7 of steroids -C/w Antitussives with Guaifenesin-Dextromethorphan 10 mL po q4hprn Cough and Chlorpheniramine-Hydrocodone 5 mL po q12hprn -Check Blood Cx x2 and showed NGTD at 5 Days -C/w Zinc 220 mg po Daily and Vitamin C  -Airborne and Contact Precautions -C/w Combivent Scheduled 1 puff IH TID and Albuterol 1-2 puff IH q4hprn -Add Flutter Valve and Incentive Spirometry -Repeat CXR showed "No active disease." -Continue to monitor and trend inflammatory markers and repeat chest x-ray imaging in the a.m. -Is open to temporary hemodialysis if necessary and current no indication for now but she is worsening her renal function and currently not uremic and still having good urinary output   Hypomagnesemia -Improved. Mag Level was 2.0 this AM  -Continue to Monitor and Replete as Necessary -Repeat Mag Level in the AM   Hyperlipidemia  -C/w Atorvastatin 20 mg po Daily   Hypertension, difficulty to control in the past -Increased Amlodipine to 10 mg -Continue with Furosemide 80 mg p.o. twice daily -Discontinued labetalol for concern of hypoglycemia unawareness -BP was 158/59 this morning  PVD -Continue Cilostazol 100 mg po BID and with ASA 325 mg po BID and Atorvastatin 20 mg po qHS -Carotid ultrasound as above  Iron Deficiency Anemia -Anemia panel done recently showed an iron level of 20, U IBC of 155, TIBC 175, saturation ratios of 11%, ferritin level of 101 -Continued iron supplement and  received a dose of Ferumoxytol 510 mg  per Nephrology is to receive another dose today and Darbepoetin Alpha 40 mcg on Monday  -Patient's Hb/HCt went from 8.9/28.8 -> 9.0/28.7 -> 9.1/28.7 -> 9.5/29.0 -> 9.3/27.7 -> 10.9/32.6 -> 10.4/29.7 -> 9.2/26.7 -Continue to monitor for signs and symptoms of bleeding; currently no overt bleeding noted -Repeat CBC in AM   Tobacco use with Emphysema -Smokes half pack per day -Tobacco cessation provided  Atraumatic right eyelid swelling, unknown etiology, resolved -Continue eyedrops with Liquifilm Tears 1 drop Right Eye as Needed  GERD -C/w Pantoprazole 40 mg po Daily   Hyponatremia -Continued to worsen but now improved  -Patient sodium was 132 -> 128 -> 124 -> 126 -> 125 -> 126 -Nephrology is working this up and has ordered serum osmolality (298), urine osmolality (218), as well as urine sodium (20) -Renal suspects that the patient has hypervolemic hyponatremia in the setting of worsening renal function but I will also check a urine osmolality again and she is going to increase the Lasix from 40 mg p.o. twice daily to 80 mg p.o. twice daily we will continue per Nephrology Recc's -Continue monitor and trend and repeat CMP in the a.m.  Leukocytosis -Patient's WBC went from 4.6 -> 13.5 -> 14.5 -> 17.1 -> 16.4 -> 18.4 -In the setting of steroid demargination -Continue to monitor for signs and symptoms of infection  -Repeat CBC not done this a.m. so we will have  added on another -Repeat CBC in a.m.  Hyperkalemia -In the setting of worsening renal function -Patient potassium is now 5.0 -Continue to monitor and trend and repeat CMP in a.m.  GOC: DNR poA  DVT prophylaxis: Heparin 5,000 units sq q8h  Code Status: DO NOT RESUSCITATE Family Communication: No family present at bedside  Disposition Plan: Pending Improvement in Respiratory Status, Renal Fxn, and completion of Remdesivir; Patient needs improvement in her renal function and clearance by  nephrology  Consultants:   Nephrology   Procedures: None   Antimicrobials:  Anti-infectives (From admission, onward)   Start     Dose/Rate Route Frequency Ordered Stop   02/11/19 1000  remdesivir 100 mg in sodium chloride 0.9 % 100 mL IVPB     100 mg 200 mL/hr over 30 Minutes Intravenous Daily 02/10/19 0803 02/14/19 1238   02/10/19 1000  remdesivir 200 mg in sodium chloride 0.9% 250 mL IVPB     200 mg 580 mL/hr over 30 Minutes Intravenous Once 02/10/19 0803 02/10/19 1145     Subjective: Seen and examined at bedside she is frustrated and wanted to go home today.  No chest pain, lightheadedness or dizziness.  States that she still coughing some.  Denies any lightheadedness or dizziness.  No other concerns or complaints at this time.  Objective: Vitals:   02/17/19 0547 02/17/19 0800 02/17/19 0934 02/17/19 1418  BP: (!) 166/72 (!) 149/70 (!) 149/70 (!) 158/59  Pulse:  (!) 105  98  Resp: (!) 23 15  17   Temp: (!) 97.5 F (36.4 C)   98.1 F (36.7 C)  TempSrc: Oral   Oral  SpO2: 91% 94%  93%  Weight:      Height:        Intake/Output Summary (Last 24 hours) at 02/17/2019 1601 Last data filed at 02/17/2019 0920 Gross per 24 hour  Intake 320 ml  Output 1800 ml  Net -1480 ml   Filed Weights   02/13/19 0542 02/15/19 0450 02/17/19 0500  Weight: 70.6 kg 76.1 kg 70.4 kg   Examination: Physical Exam:  Constitutional: WN/W overweight AAF in NAD and appears a little frustrated  Eyes: Lids and conjunctivae normal, sclerae anicteric  ENMT: External Ears, Nose appear normal. Grossly normal hearing.  Neck: Appears normal, supple, no cervical masses, normal ROM, no appreciable thyromegaly; no JVD Respiratory: Diminished to auscultation bilaterally with slightly coarse breath sounds, no wheezing, rales, rhonchi or crackles. Normal respiratory effort and patient is not tachypenic.  She has unlabored breathing and is not wearing any supplemental oxygen via nasal cannula but is coughing  slightly. Cardiovascular: RRR, no murmurs / rubs / gallops. S1 and S2 auscultated.  1+ extremity edema still.  Abdomen: Soft, non-tender, distended secondary to body habitus. Bowel sounds positive x4.  GU: Deferred. Musculoskeletal: No clubbing / cyanosis of digits/nails. No joint deformity upper and lower extremities.  Skin: No rashes, lesions, ulcers on limited skin evaluation. No induration; Warm and dry.  Neurologic: CN 2-12 grossly intact with no focal deficits. Romberg sign and cerebellar reflexes not assessed.  Psychiatric: Normal judgment and insight. Alert and oriented x 3.  Frustrated mood and appropriate affect.   Data Reviewed: I have personally reviewed following labs and imaging studies  CBC: Recent Labs  Lab 02/13/19 0249 02/14/19 1629 02/15/19 0915 02/16/19 0639 02/17/19 0500  WBC 14.5* 15.2* 17.1* 16.4* 18.4*  NEUTROABS 12.9* 13.4* 14.4* 12.8* 14.5*  HGB 9.3* 10.9* 10.4* 9.2* 10.3*  HCT 27.7* 32.6* 29.7* 26.7* 30.6*  MCV  76.7* 76.2* 74.6* 75.0* 75.0*  PLT 284 353 355 315 497   Basic Metabolic Panel: Recent Labs  Lab 02/11/19 0500 02/11/19 1006 02/12/19 0251 02/12/19 0251 02/13/19 0249 02/14/19 0928 02/15/19 0351 02/16/19 0639 02/17/19 0500  NA   < >  --  131*   < > 128* 124* 126* 125* 126*  K   < >  --  4.8   < > 5.1 5.2* 5.0 4.9 5.0  CL   < >  --  100   < > 97* 92* 95* 93* 92*  CO2   < >  --  17*   < > 17* 16* 17* 16* 19*  GLUCOSE   < >  --  195*   < > 211* 160* 56* 158* 107*  BUN   < >  --  74*   < > 91* 103* 112* 112* 116*  CREATININE   < >  --  5.42*   < > 6.09* 6.56* 6.46* 6.81* 6.68*  CALCIUM   < >  --  8.1*   < > 8.0* 7.7* 7.7* 7.5* 8.0*  MG  --  1.8 1.9  --  1.8  --   --  1.8 2.0  PHOS   < > 4.1 4.1   < > 4.9* 5.1* 5.0* 6.0* 5.9*   < > = values in this interval not displayed.   GFR: Estimated Creatinine Clearance: 7.8 mL/min (A) (by C-G formula based on SCr of 6.68 mg/dL (H)). Liver Function Tests: Recent Labs  Lab 02/11/19 1006  02/11/19 1006 02/12/19 0251 02/12/19 0251 02/13/19 0249 02/14/19 0928 02/15/19 0351 02/16/19 0639 02/17/19 0500  AST 32  --  32  --  33  --   --  53* 53*  ALT 35  --  38  --  43  --   --  75* 98*  ALKPHOS 74  --  80  --  78  --   --  84 99  BILITOT 0.4  --  0.8  --  0.6  --   --  0.9 0.9  PROT 5.5*  --  5.8*  --  5.3*  --   --  5.1* 6.0*  ALBUMIN 2.3*   < > 2.6*   < > 2.5* 2.7* 2.6* 2.4* 3.1*   < > = values in this interval not displayed.   No results for input(s): LIPASE, AMYLASE in the last 168 hours. No results for input(s): AMMONIA in the last 168 hours. Coagulation Profile: No results for input(s): INR, PROTIME in the last 168 hours. Cardiac Enzymes: No results for input(s): CKTOTAL, CKMB, CKMBINDEX, TROPONINI in the last 168 hours. BNP (last 3 results) No results for input(s): PROBNP in the last 8760 hours. HbA1C: No results for input(s): HGBA1C in the last 72 hours. CBG: Recent Labs  Lab 02/16/19 1713 02/16/19 1958 02/16/19 2241 02/17/19 0811 02/17/19 1221  GLUCAP 189* 280* 197* 104* 208*   Lipid Profile: No results for input(s): CHOL, HDL, LDLCALC, TRIG, CHOLHDL, LDLDIRECT in the last 72 hours. Thyroid Function Tests: No results for input(s): TSH, T4TOTAL, FREET4, T3FREE, THYROIDAB in the last 72 hours. Anemia Panel: Recent Labs    02/16/19 0639 02/17/19 1059  FERRITIN 1,141* 1,489*   Sepsis Labs: No results for input(s): PROCALCITON, LATICACIDVEN in the last 168 hours.  Recent Results (from the past 240 hour(s))  Culture, blood (routine x 2)     Status: None   Collection Time: 02/10/19 10:33 AM   Specimen:  BLOOD  Result Value Ref Range Status   Specimen Description BLOOD RIGHT ANTECUBITAL  Final   Special Requests AEROBIC BOTTLE ONLY Blood Culture adequate volume  Final   Culture   Final    NO GROWTH 5 DAYS Performed at [facility], [address], [place], [place] [address]    Report Status 02/15/2019 FINAL  Final  Culture, blood  (routine x 2)     Status: None   Collection Time: 02/10/19 10:33 AM   Specimen: BLOOD  Result Value Ref Range Status   Specimen Description BLOOD RIGHT ANTECUBITAL  Final   Special Requests   Final    BOTTLES DRAWN AEROBIC AND ANAEROBIC Blood Culture adequate volume   Culture   Final    NO GROWTH 5 DAYS Performed at [facility], [place] [address] [place] [address]    Report Status 02/15/2019 FINAL  Final    Radiology Studies: No results found.  Scheduled Meds:  amLODipine  10 mg Oral Daily   vitamin C  500 mg Oral Daily   aspirin  325 mg Oral Daily   atorvastatin  20 mg Oral q1800   cilostazol  100 mg Oral BID   darbepoetin (ARANESP) injection - NON-DIALYSIS  40 mcg Subcutaneous Q Mon-1800   dexamethasone (DECADRON) injection  6 mg Intravenous Q24H   furosemide  80 mg Oral BID   gabapentin  100 mg Oral BID   heparin  5,000 Units Subcutaneous Q8H   insulin aspart  0-5 Units Subcutaneous QHS   insulin aspart  0-9 Units Subcutaneous TID WC   insulin aspart  7 Units Subcutaneous TID WC   insulin detemir  6 Units Subcutaneous BID   pantoprazole  40 mg Oral Daily   senna-docusate  1 tablet Oral QHS   sodium bicarbonate  1,300 mg Oral BID   zinc sulfate  220 mg Oral Daily   Continuous Infusions:   LOS: 10 days   Kerney Elbe, DO [facility] PAGER is on AMION  If 7PM-7AM, please contact night-coverage [email or web address]

## 2019-02-18 ENCOUNTER — Inpatient Hospital Stay (HOSPITAL_COMMUNITY): Payer: Medicare Other

## 2019-02-18 DIAGNOSIS — R0602 Shortness of breath: Secondary | ICD-10-CM

## 2019-02-18 LAB — COMPREHENSIVE METABOLIC PANEL
ALT: 94 U/L — ABNORMAL HIGH (ref 0–44)
AST: 41 U/L (ref 15–41)
Albumin: 2.7 g/dL — ABNORMAL LOW (ref 3.5–5.0)
Alkaline Phosphatase: 85 U/L (ref 38–126)
Anion gap: 16 — ABNORMAL HIGH (ref 5–15)
BUN: 117 mg/dL — ABNORMAL HIGH (ref 8–23)
CO2: 20 mmol/L — ABNORMAL LOW (ref 22–32)
Calcium: 7.8 mg/dL — ABNORMAL LOW (ref 8.9–10.3)
Chloride: 91 mmol/L — ABNORMAL LOW (ref 98–111)
Creatinine, Ser: 6.51 mg/dL — ABNORMAL HIGH (ref 0.44–1.00)
GFR calc Af Amer: 7 mL/min — ABNORMAL LOW (ref >60)
GFR calc non Af Amer: 6 mL/min — ABNORMAL LOW (ref >60)
Glucose, Bld: 102 mg/dL — ABNORMAL HIGH (ref 70–99)
Potassium: 4.8 mmol/L (ref 3.5–5.1)
Sodium: 127 mmol/L — ABNORMAL LOW (ref 135–145)
Total Bilirubin: 0.7 mg/dL (ref 0.3–1.2)
Total Protein: 5.2 g/dL — ABNORMAL LOW (ref 6.5–8.1)

## 2019-02-18 LAB — CBC WITH DIFFERENTIAL/PLATELET
Abs Immature Granulocytes: 0.2 10*3/uL — ABNORMAL HIGH (ref 0.00–0.07)
Basophils Absolute: 0 10*3/uL (ref 0.0–0.1)
Basophils Relative: 0 %
Eosinophils Absolute: 0 10*3/uL (ref 0.0–0.5)
Eosinophils Relative: 0 %
HCT: 27.8 % — ABNORMAL LOW (ref 36.0–46.0)
Hemoglobin: 9.4 g/dL — ABNORMAL LOW (ref 12.0–15.0)
Lymphocytes Relative: 4 %
Lymphs Abs: 0.6 10*3/uL — ABNORMAL LOW (ref 0.7–4.0)
MCH: 25.4 pg — ABNORMAL LOW (ref 26.0–34.0)
MCHC: 33.8 g/dL (ref 30.0–36.0)
MCV: 75.1 fL — ABNORMAL LOW (ref 80.0–100.0)
Monocytes Absolute: 1.9 10*3/uL — ABNORMAL HIGH (ref 0.1–1.0)
Monocytes Relative: 12 %
Myelocytes: 1 %
Neutro Abs: 13.4 10*3/uL — ABNORMAL HIGH (ref 1.7–7.7)
Neutrophils Relative %: 83 %
Platelets: 381 10*3/uL (ref 150–400)
RBC: 3.7 MIL/uL — ABNORMAL LOW (ref 3.87–5.11)
RDW: 14.4 % (ref 11.5–15.5)
WBC: 16.2 10*3/uL — ABNORMAL HIGH (ref 4.0–10.5)
nRBC: 0 /100 WBC
nRBC: 1.1 % — ABNORMAL HIGH (ref 0.0–0.2)

## 2019-02-18 LAB — FERRITIN: Ferritin: 1420 ng/mL — ABNORMAL HIGH (ref 11–307)

## 2019-02-18 LAB — GLUCOSE, CAPILLARY
Glucose-Capillary: 87 mg/dL (ref 70–99)
Glucose-Capillary: 199 mg/dL — ABNORMAL HIGH (ref 70–99)
Glucose-Capillary: 241 mg/dL — ABNORMAL HIGH (ref 70–99)
Glucose-Capillary: 195 mg/dL — ABNORMAL HIGH (ref 70–99)

## 2019-02-18 LAB — C-REACTIVE PROTEIN: CRP: <0.5 mg/dL (ref <1.0)

## 2019-02-18 LAB — FIBRINOGEN: Fibrinogen: 353 mg/dL (ref 210–475)

## 2019-02-18 LAB — MAGNESIUM: Magnesium: 1.8 mg/dL (ref 1.7–2.4)

## 2019-02-18 LAB — D-DIMER, QUANTITATIVE: D-Dimer, Quant: 1.42 ug/mL-FEU — ABNORMAL HIGH (ref 0.00–0.50)

## 2019-02-18 LAB — PHOSPHORUS: Phosphorus: 6 mg/dL — ABNORMAL HIGH (ref 2.5–4.6)

## 2019-02-18 LAB — SEDIMENTATION RATE: Sed Rate: 25 mm/hr — ABNORMAL HIGH (ref 0–22)

## 2019-02-18 LAB — LACTATE DEHYDROGENASE: LDH: 394 U/L — ABNORMAL HIGH (ref 98–192)

## 2019-02-18 MED ORDER — INSULIN ASPART 100 UNIT/ML ~~LOC~~ SOLN
4.0000 [IU] | Freq: Three times a day (TID) | SUBCUTANEOUS | Status: DC
  Administered 2019-02-18 – 2019-02-19 (×3): 4 [IU] via SUBCUTANEOUS

## 2019-02-18 MED ORDER — INSULIN DETEMIR 100 UNIT/ML ~~LOC~~ SOLN
8.0000 [IU] | Freq: Every day | SUBCUTANEOUS | Status: DC
  Administered 2019-02-19: 8 [IU] via SUBCUTANEOUS
  Filled 2019-02-18 (×3): qty 0.08

## 2019-02-18 NOTE — TOC Initial Note (Addendum)
Transition of Care [facility]) - Initial/Assessment Note    Patient Details  Name: Julie Brewer MRN: [id number] Date of Birth: 1950-12-11  Transition of Care [facility]) CM/SW Contact:    Maryclare Labrador, RN Phone Number: 02/18/2019, 1:30 PM  Clinical Narrative:  PTA independent from home with niece.   Pt informed CM that she is blind but still independent, only uses a cane when she goes outside the home.  Pt also informed CM that she is independent with mobility in the hospital too.   Pt confirms she has a PCP and denied barriers with paying for discharge medications.  Pt informed CM that transportation is not a barrier for her because her daughter doesn't work and can transport her if needed    Renal watching for potential HD need - but hopeful for recovery.   Pt denied barriers with [facility] needs.    TOC will continue to follow.         Expected Discharge Plan: Home/Self Care Barriers to Discharge: Continued Medical Work up(renal is barrier)   Patient Goals and CMS Choice Patient states their goals for this hospitalization and ongoing recovery are:: Pt states she is ready to get out of the hospital and she is staying prayed up that she doesnt need HD      Expected Discharge Plan and Services Expected Discharge Plan: Home/Self Care       Living arrangements for the past 2 months: Single Family Home                                      Prior Living Arrangements/Services Living arrangements for the past 2 months: Single Family Home Lives with:: Relatives(pt lives with her neice) Patient language and need for interpreter reviewed:: Yes        Need for Family Participation in Patient Care: Yes (Comment) Care giver support system in place?: Yes (comment) Current home services: DME(cane) Criminal Activity/Legal Involvement Pertinent to Current Situation/Hospitalization: No - Comment as needed  Activities of Daily Living Home Assistive Devices/Equipment: Bedside  commode/3-in-1, Dentures (specify type), Scales, Other (Comment), CBG Meter(Upper and lowers. Cane for the blind) ADL Screening (condition at time of admission) Patient's cognitive ability adequate to safely complete daily activities?: Yes Is the patient deaf or have difficulty hearing?: No Does the patient have difficulty seeing, even when wearing glasses/contacts?: Yes Does the patient have difficulty concentrating, remembering, or making decisions?: No Patient able to express need for assistance with ADLs?: Yes Does the patient have difficulty dressing or bathing?: No Independently performs ADLs?: Yes (appropriate for developmental age) Does the patient have difficulty walking or climbing stairs?: No Weakness of Legs: Both Weakness of Arms/Hands: Both  Permission Sought/Granted                  Emotional Assessment   Attitude/Demeanor/Rapport: Gracious, Charismatic, Self-Confident, Engaged Affect (typically observed): Accepting, Adaptable Orientation: : Oriented to Self, Oriented to Place, Oriented to  Time, Oriented to Situation   Psych Involvement: No (comment)  Admission diagnosis:  Syncope [R55] AKI (acute kidney injury) ([place]) [N17.9] Syncope, unspecified syncope type [R55] Patient Active Problem List   Diagnosis Date Noted  . Syncope 02/06/2019  . AKI (acute kidney injury) ([place]) 06/20/2018  . Acute exacerbation of CHF (congestive heart failure) ([place]) 06/20/2018  . Acute respiratory failure with hypoxia ([place]) 06/20/2018  . Tobacco abuse 06/20/2018  . GERD (gastroesophageal reflux disease) 06/08/2017  .  Guaiac positive stools 06/08/2017  . CRI (chronic renal insufficiency), stage 3 (moderate) ([place]) 05/01/2016  . History of TIA (transient ischemic attack) 05/01/2016  . Blind 05/01/2016  . Iron deficiency anemia 03/09/2016  . Syncope and collapse 01/16/2016  . Dysphagia, pharyngoesophageal phase 05/05/2014  . Insulin dependent diabetes mellitus with complications  ([place]) 15/94/5859  . Essential hypertension 10/06/2012  . Dyslipidemia 09/22/2012  . Peripheral vascular disease ([place]) 09/01/2012   PCP:  [name], MD Pharmacy:   [facility] ([place]), [place] - [address] [place] ([place]) [place] [address] Phone: [phone] Fax: [phone]     Social Determinants of Health (SDOH) Interventions    Readmission Risk Interventions Readmission Risk Prevention Plan 06/25/2018  Transportation Screening Complete  HRI or [place] Complete  Social Work Consult for [place] Planning/Counseling Complete  Palliative Care Screening Not Applicable  Medication Review (RN Care Manager) Referral to Pharmacy  Some recent data might be hidden

## 2019-02-18 NOTE — Progress Notes (Signed)
PROGRESS NOTE    Julie Brewer  NAT:[id number] DOB: 22-Nov-1950 DOA: 02/06/2019 PCP: [name], MD   Brief Narrative:  The patient is a 69 year old female with past medical history of hypertension, CKD 4 with  baseline creatinine of 3, type 2 diabetes, tobacco use, hypoglycemic episodes noted in the past, peripheral vascular disease, multiple syncopal episodes in the past with loop recorder placed 06/20/2016 without evidence of arrhythmia and follows with cardiology, who presented to the ED on 1/8 for syncopal episode of unclear etiology.  She was found to be COVID-19 positive on admission along with AKI. -  Was not started on COVID-19 directed treatment until 1/12 as below. 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 Sallyanne Kuster, interrogated 02/06/2019 - unremarkable -Limited echo unremarkable 1/10 -No syncopal episodes noted in the hospital   Type 2 Diabetes; has had periods of hyperglycemia and hypoglycemia and currently was hypoglycemic last night -Follows with endocrinology, concern for hypoglycemic unawareness in the past -HA1C 7.1 on 02/07/19 -Does not check blood sugar regularly at home -Outpatient meds:Lantus 14 units daily, Regular 12 u with breakfast, Regular 5-7 u with dinner, CBG stable on her lower dose of insulin, anticipate insulin requirement will go down further as she comes off Decadron after tomorrow's dose -Decrease meal coverage to 4 units, decrease Lantus dose to 8 units daily instead of 6 twice daily  SIRS secondary to COVID-19 -Initially did not have hypoxia or respiratory symptoms -On 1/12 developed fever, tachypnea, 2 L O2 requirement and chest x-ray which showed lower lobe airspace disease concerning for pneumonia -She was started on IV remdesivir and Decadron 1/12, completed 5-day course of remdesivir, on day 9/10 of Decadron -O2 requirements have improved, currently weaned off to room air   Hypomagnesemia -Improved, repleted  Hyperlipidemia  -C/w Atorvastatin 20 mg po Daily   Hypertension, difficulty to control in the past -Increased Amlodipine to 10 mg -Continue with Furosemide 80 mg p.o. twice daily -Discontinued labetalol for concern of hypoglycemia unawareness -Stable  PVD -Continue Cilostazol 100 mg po BID and with ASA 325 mg  po BID  and Atorvastatin 20 mg po qHS  Iron Deficiency Anemia -Anemia panel done recently showed an iron level of 20, U IBC of 155, TIBC 175, saturation ratios of 11%, ferritin level of 101 -Continued iron supplement, given IV iron x2 doses, last dose 1/19 -Continue Aranesp -Hemoglobin is slightly lower than baseline, monitor  Tobacco use with Emphysema -Smokes half pack per day -Tobacco cessation provided  Atraumatic right eyelid swelling, unknown etiology, resolved -Continue eyedrops with Liquifilm Tears 1 drop Right Eye as Needed  GERD -C/w Pantoprazole 40 mg po Daily   Hyperkalemia -Improved, renal diet  GOC: DNR poA  DVT prophylaxis: Heparin 5,000 units sq q8h  Code Status: DNR Family Communication: No family at bedside, will update daughter Disposition Plan: Pending improvement in kidney function  Consultants:   Nephrology   Procedures: None   Antimicrobials:  Anti-infectives (From admission, onward)   Start     Dose/Rate Route Frequency Ordered Stop   02/11/19 1000  remdesivir 100 mg in sodium chloride 0.9 % 100 mL IVPB     100 mg 200 mL/hr over 30 Minutes Intravenous Daily 02/10/19 0803 02/14/19 1238   02/10/19 1000  remdesivir 200 mg in sodium chloride 0.9% 250 mL IVPB     200 mg 580 mL/hr over 30 Minutes Intravenous Once 02/10/19 0803 02/10/19 1145     Subjective: -Feels okay, no complaints, no nausea vomiting, no dyspnea, wants to go home  Objective: Vitals:   02/17/19 1715 02/17/19 2044 02/18/19 0500 02/18/19 0815  BP: (!) 150/62  (!) 154/61 (!) 158/60  Pulse: (!) 101     Resp: 15   20  Temp:  97.6 F (36.4 C) 99.5 F (37.5 C)   TempSrc:  Oral Oral   SpO2: 94%   95%  Weight:      Height:        Intake/Output Summary (Last 24 hours) at 02/18/2019 1155 Last data filed at 02/18/2019 0943 Gross per 24 hour  Intake 540 ml  Output 0 ml  Net 540 ml   Filed Weights   02/13/19 0542 02/15/19 0450 02/17/19 0500  Weight: 70.6 kg 76.1 kg 70.4 kg     Examination:  Gen: Elderly pleasant blind female, sitting up in the recliner, AAO x3, no distress HEENT: Oral mucosa moist, no JVD  lungs: Clear CVS: RRR,No Gallops,Rubs or new Murmurs Abd: soft, Non tender, non distended, BS present Extremities: No edema Skin: no new rashes   Data Reviewed: I have personally reviewed following labs and imaging studies  CBC: Recent Labs  Lab 02/14/19 1629 02/15/19 0915 02/16/19 0639 02/17/19 0500 02/18/19 0619  WBC 15.2* 17.1* 16.4* 18.4* 16.2*  NEUTROABS 13.4* 14.4* 12.8* 14.5* 13.4*  HGB 10.9* 10.4* 9.2* 10.3* 9.4*  HCT 32.6* 29.7* 26.7* 30.6* 27.8*  MCV 76.2* 74.6* 75.0* 75.0* 75.1*  PLT 353 355 315 394 716   Basic Metabolic Panel: Recent Labs  Lab 02/12/19 0251 02/12/19 0251 02/13/19 0249 02/13/19 0249 02/14/19 0928 02/15/19 0351 02/16/19 0639 02/17/19 0500 02/18/19 0619  NA 131*   < > 128*   < > 124* 126* 125* 126* 127*  K 4.8   < > 5.1   < > 5.2* 5.0 4.9 5.0 4.8  CL 100   < > 97*   < > 92* 95* 93* 92* 91*  CO2 17*   < > 17*   < > 16* 17* 16* 19* 20*  GLUCOSE 195*   < > 211*   < > 160* 56* 158* 107*  102*  BUN 74*   < > 91*   < > 103* 112* 112* 116* 117*  CREATININE 5.42*   < > 6.09*   < > 6.56* 6.46* 6.81* 6.68* 6.51*  CALCIUM 8.1*   < > 8.0*   < > 7.7* 7.7* 7.5* 8.0* 7.8*  MG 1.9  --  1.8  --   --   --  1.8 2.0 1.8  PHOS 4.1   < > 4.9*   < > 5.1* 5.0* 6.0* 5.9* 6.0*   < > = values in this interval not displayed.   GFR: Estimated Creatinine Clearance: 8 mL/min (A) (by C-G formula based on SCr of 6.51 mg/dL (H)). Liver Function Tests: Recent Labs  Lab 02/12/19 0251 02/12/19 0251 02/13/19 0249 02/13/19 0249 02/14/19 0928 02/15/19 0351 02/16/19 0639 02/17/19 0500 02/18/19 0619  AST 32  --  33  --   --   --  53* 53* 41  ALT 38  --  43  --   --   --  75* 98* 94*  ALKPHOS 80  --  78  --   --   --  84 99 85  BILITOT 0.8  --  0.6  --   --   --  0.9 0.9 0.7  PROT 5.8*  --  5.3*  --   --   --  5.1* 6.0* 5.2*   ALBUMIN 2.6*   < > 2.5*   < > 2.7* 2.6* 2.4* 3.1* 2.7*   < > = values in this interval not displayed.   No results for input(s): LIPASE, AMYLASE in the last 168 hours. No results for input(s): AMMONIA in the last 168 hours. Coagulation Profile: No results for input(s): INR, PROTIME in the last 168 hours. Cardiac Enzymes: No results for input(s): CKTOTAL, CKMB, CKMBINDEX, TROPONINI in the last 168 hours. BNP (last 3 results) No results for input(s): PROBNP in the last 8760 hours. HbA1C: No results for input(s): HGBA1C in the last 72 hours. CBG: Recent Labs  Lab 02/17/19 1221 02/17/19 1720 02/17/19 2041 02/18/19 0735 02/18/19 1129  GLUCAP 208* 222* 289* 87 199*   Lipid Profile: No results for input(s): CHOL, HDL, LDLCALC, TRIG, CHOLHDL, LDLDIRECT in the last 72 hours. Thyroid Function Tests: No results for input(s): TSH, T4TOTAL, FREET4, T3FREE, THYROIDAB in the last 72 hours. Anemia Panel: Recent Labs    02/17/19 1059 02/18/19 0619  FERRITIN 1,489* 1,420*   Sepsis Labs: No results for input(s): PROCALCITON, LATICACIDVEN in the last 168 hours.  Recent Results (from the past 240 hour(s))  Culture, blood (routine x 2)     Status: None   Collection Time: 02/10/19 10:33 AM   Specimen: BLOOD  Result Value Ref Range Status   Specimen Description BLOOD RIGHT ANTECUBITAL  Final   Special Requests AEROBIC BOTTLE ONLY Blood Culture adequate volume  Final   Culture   Final    NO GROWTH 5 DAYS Performed at [facility], [address] [place], [place] [address]    Report Status 02/15/2019 FINAL  Final  Culture, blood (routine x 2)     Status: None   Collection Time: 02/10/19 10:33 AM   Specimen: BLOOD  Result Value Ref Range Status   Specimen Description BLOOD RIGHT ANTECUBITAL  Final   Special Requests   Final    BOTTLES DRAWN AEROBIC AND ANAEROBIC Blood Culture adequate volume   Culture   Final    NO GROWTH 5 DAYS Performed at [facility], [address], [place], [place] [address]    Report Status 02/15/2019 FINAL  Final    Radiology Studies: DG CHEST PORT 1 VIEW  Result Date: 02/18/2019 CLINICAL DATA:  Shortness of breath EXAM: PORTABLE CHEST 1 VIEW COMPARISON:  02/12/2019 FINDINGS: The heart size and mediastinal contours are within normal limits. Implantable loop recorder. Mild, diffuse interstitial pulmonary opacity, slightly improved compared to prior examination. No new or focal airspace opacity. Chronic scarring of the peripheral left upper lobe. The visualized skeletal structures are unremarkable. IMPRESSION: Mild, diffuse interstitial pulmonary opacity, slightly improved compared to prior examination likely reflecting mild edema and/or chronic interstitial change. No new or focal airspace opacity. Electronically Signed   By: Eddie Candle M.D.   On: 02/18/2019 08:24   US Abdomen Limited RUQ  Result Date: 02/17/2019 CLINICAL DATA:  Elevated liver function tests. EXAM: ULTRASOUND ABDOMEN LIMITED RIGHT UPPER QUADRANT COMPARISON:  06/09/2017 CT FINDINGS: Gallbladder: Stones at up to 6 mm. No wall thickening or pericholecystic fluid. Sonographic Murphy's sign was not elicited. Common bile duct: Diameter: Normal, 6 mm. Liver: No focal lesion identified. Within normal limits in parenchymal echogenicity. Portal vein is patent on color Doppler imaging with normal direction of blood flow towards the liver. Other: Small volume perihepatic ascites. Increased right renal echogenicity. IMPRESSION: 1. Cholelithiasis without acute cholecystitis or biliary duct dilatation. 2. Small volume perihepatic ascites. 3. Hyperechoic right kidney, consistent with medical renal disease. Electronically Signed   By: Abigail Miyamoto M.D.   On: 02/17/2019 19:35    Scheduled Meds: . amLODipine  10 mg Oral Daily  . vitamin C  500 mg Oral Daily  . aspirin  325 mg Oral Daily  . atorvastatin  20 mg Oral q1800  . cilostazol  100 mg Oral BID  . darbepoetin (ARANESP) injection -  NON-DIALYSIS  40 mcg Subcutaneous Q Mon-1800  . dexamethasone (DECADRON) injection  6 mg Intravenous Q24H  . furosemide  80 mg Oral BID  . gabapentin  100 mg Oral BID  . heparin  5,000 Units Subcutaneous Q8H  . insulin aspart  0-5 Units Subcutaneous QHS  . insulin aspart  0-9 Units Subcutaneous TID WC  . insulin aspart  7 Units Subcutaneous TID WC  . insulin detemir  6 Units Subcutaneous BID  . pantoprazole  40 mg Oral Daily  . senna-docusate  1 tablet Oral QHS  . sodium bicarbonate  1,300 mg Oral BID  . zinc sulfate  220 mg Oral Daily   Continuous Infusions:   LOS: 11 days   Rhyder Bratz,MD [facility]

## 2019-02-18 NOTE — Progress Notes (Signed)
Subjective:    No interval events, called patient daughter on phone during exam, updated  No complaints.   Creatinine slightly improved, BUN stable  On room air  0.6L UOP  No N/V/anorexia/SOB/LEE, hiccups, itching  Objective Vital signs in last 24 hours: Vitals:   02/17/19 1715 02/17/19 2044 02/18/19 0500 02/18/19 0815  BP: (!) 150/62  (!) 154/61 (!) 158/60  Pulse: (!) 101     Resp: 15   20  Temp:  97.6 F (36.4 C) 99.5 F (37.5 C)   TempSrc:  Oral Oral   SpO2: 94%   95%  Weight:      Height:       Weight change:   Intake/Output Summary (Last 24 hours) at 02/18/2019 1139 Last data filed at 02/18/2019 0814 Gross per 24 hour  Intake 540 ml  Output 0 ml  Net 540 ml    Assessment/ Plan: Pt is a 69 y.o. yo female with blindness, DM, HTN, PAD and CKD 4-  crt around 3- followed by [place] who was admitted on 02/06/2019 with COVID/syncope - has developed some A on CRF that is slow to resolve Assessment/Plan: 1. Renal-  Has CKD at baseline, pretty advanced.  Developed A on CRF in the setting of above.  No nephrotoxins and no obvious BP drops-  U/A indicative of hemodynamic change- min protein and no cells.  Nonoliguric.  Stable labs in the past 24 hours, cont to trend daily labs/UOP.  No immediate indication for dialysis but we need to see this get better, she would consent to dialysis if needed.   2. HTN/vol-  Seemed euvolemic-  Cont BID lasix for now  3. Anemia- fairly stable -  Iron was low, gave feraheme on 1/13 and 1/17 Cont ESA 4. Secondary hyperparathyroidism- no recent PTH--  Phos is OK  5. COVID-  Per primary-  Has done well-  On RA  6. Hyponatremia-  CTM, ASx,  7.  Metabolic acidosis -  Cont NaHCO3   Rexene Agent    Labs: Basic Metabolic Panel: Recent Labs  Lab 02/16/19 0639 02/17/19 0500 02/18/19 0619  NA 125* 126* 127*  K 4.9 5.0 4.8  CL 93* 92* 91*  CO2 16* 19* 20*  GLUCOSE 158* 107* 102*  BUN 112* 116* 117*  CREATININE 6.81* 6.68* 6.51*  CALCIUM  7.5* 8.0* 7.8*  PHOS 6.0* 5.9* 6.0*   Liver Function Tests: Recent Labs  Lab 02/16/19 0639 02/17/19 0500 02/18/19 0619  AST 53* 53* 41  ALT 75* 98* 94*  ALKPHOS 84 99 85  BILITOT 0.9 0.9 0.7  PROT 5.1* 6.0* 5.2*  ALBUMIN 2.4* 3.1* 2.7*   No results for input(s): LIPASE, AMYLASE in the last 168 hours. No results for input(s): AMMONIA in the last 168 hours. CBC: Recent Labs  Lab 02/14/19 1629 02/14/19 1629 02/15/19 0915 02/15/19 0915 02/16/19 0639 02/17/19 0500 02/18/19 0619  WBC 15.2*   < > 17.1*   < > 16.4* 18.4* 16.2*  NEUTROABS 13.4*   < > 14.4*   < > 12.8* 14.5* 13.4*  HGB 10.9*   < > 10.4*   < > 9.2* 10.3* 9.4*  HCT 32.6*   < > 29.7*   < > 26.7* 30.6* 27.8*  MCV 76.2*  --  74.6*  --  75.0* 75.0* 75.1*  PLT 353   < > 355   < > 315 394 381   < > = values in this interval not displayed.   Cardiac Enzymes: No results  for input(s): CKTOTAL, CKMB, CKMBINDEX, TROPONINI in the last 168 hours. CBG: Recent Labs  Lab 02/17/19 1221 02/17/19 1720 02/17/19 2041 02/18/19 0735 02/18/19 1129  GLUCAP 208* 222* 289* 87 199*    Iron Studies:  Recent Labs    02/18/19 0619  FERRITIN 1,420*   Studies/Results: DG CHEST PORT 1 VIEW  Result Date: 02/18/2019 CLINICAL DATA:  Shortness of breath EXAM: PORTABLE CHEST 1 VIEW COMPARISON:  02/12/2019 FINDINGS: The heart size and mediastinal contours are within normal limits. Implantable loop recorder. Mild, diffuse interstitial pulmonary opacity, slightly improved compared to prior examination. No new or focal airspace opacity. Chronic scarring of the peripheral left upper lobe. The visualized skeletal structures are unremarkable. IMPRESSION: Mild, diffuse interstitial pulmonary opacity, slightly improved compared to prior examination likely reflecting mild edema and/or chronic interstitial change. No new or focal airspace opacity. Electronically Signed   By: Eddie Candle M.D.   On: 02/18/2019 08:24   US Abdomen Limited RUQ  Result  Date: 02/17/2019 CLINICAL DATA:  Elevated liver function tests. EXAM: ULTRASOUND ABDOMEN LIMITED RIGHT UPPER QUADRANT COMPARISON:  06/09/2017 CT FINDINGS: Gallbladder: Stones at up to 6 mm. No wall thickening or pericholecystic fluid. Sonographic Murphy's sign was not elicited. Common bile duct: Diameter: Normal, 6 mm. Liver: No focal lesion identified. Within normal limits in parenchymal echogenicity. Portal vein is patent on color Doppler imaging with normal direction of blood flow towards the liver. Other: Small volume perihepatic ascites. Increased right renal echogenicity. IMPRESSION: 1. Cholelithiasis without acute cholecystitis or biliary duct dilatation. 2. Small volume perihepatic ascites. 3. Hyperechoic right kidney, consistent with medical renal disease. Electronically Signed   By: Abigail Miyamoto M.D.   On: 02/17/2019 19:35   Medications: Infusions:   Scheduled Medications: . amLODipine  10 mg Oral Daily  . vitamin C  500 mg Oral Daily  . aspirin  325 mg Oral Daily  . atorvastatin  20 mg Oral q1800  . cilostazol  100 mg Oral BID  . darbepoetin (ARANESP) injection - NON-DIALYSIS  40 mcg Subcutaneous Q Mon-1800  . dexamethasone (DECADRON) injection  6 mg Intravenous Q24H  . furosemide  80 mg Oral BID  . gabapentin  100 mg Oral BID  . heparin  5,000 Units Subcutaneous Q8H  . insulin aspart  0-5 Units Subcutaneous QHS  . insulin aspart  0-9 Units Subcutaneous TID WC  . insulin aspart  7 Units Subcutaneous TID WC  . insulin detemir  6 Units Subcutaneous BID  . pantoprazole  40 mg Oral Daily  . senna-docusate  1 tablet Oral QHS  . sodium bicarbonate  1,300 mg Oral BID  . zinc sulfate  220 mg Oral Daily    have reviewed scheduled and prn medications.  Physical Exam: Gen-  NAD- up in bedside chair Lungs-  Mostly clear CV- RRR to tachy  abd- soft, non tender Ext-  Really no edema   02/18/2019,11:39 AM  LOS: 11 days

## 2019-02-19 LAB — RENAL FUNCTION PANEL
Albumin: 2.6 g/dL — ABNORMAL LOW (ref 3.5–5.0)
Anion gap: 14 (ref 5–15)
BUN: 113 mg/dL — ABNORMAL HIGH (ref 8–23)
CO2: 21 mmol/L — ABNORMAL LOW (ref 22–32)
Calcium: 7.8 mg/dL — ABNORMAL LOW (ref 8.9–10.3)
Chloride: 93 mmol/L — ABNORMAL LOW (ref 98–111)
Creatinine, Ser: 5.73 mg/dL — ABNORMAL HIGH (ref 0.44–1.00)
GFR calc Af Amer: 8 mL/min — ABNORMAL LOW (ref >60)
GFR calc non Af Amer: 7 mL/min — ABNORMAL LOW (ref >60)
Glucose, Bld: 120 mg/dL — ABNORMAL HIGH (ref 70–99)
Phosphorus: 5.7 mg/dL — ABNORMAL HIGH (ref 2.5–4.6)
Potassium: 5.1 mmol/L (ref 3.5–5.1)
Sodium: 128 mmol/L — ABNORMAL LOW (ref 135–145)

## 2019-02-19 LAB — GLUCOSE, CAPILLARY
Glucose-Capillary: 111 mg/dL — ABNORMAL HIGH (ref 70–99)
Glucose-Capillary: 284 mg/dL — ABNORMAL HIGH (ref 70–99)
Glucose-Capillary: 330 mg/dL — ABNORMAL HIGH (ref 70–99)
Glucose-Capillary: 300 mg/dL — ABNORMAL HIGH (ref 70–99)
Glucose-Capillary: 277 mg/dL — ABNORMAL HIGH (ref 70–99)

## 2019-02-19 LAB — CBC
HCT: 25.9 % — ABNORMAL LOW (ref 36.0–46.0)
Hemoglobin: 8.8 g/dL — ABNORMAL LOW (ref 12.0–15.0)
MCH: 25.6 pg — ABNORMAL LOW (ref 26.0–34.0)
MCHC: 34 g/dL (ref 30.0–36.0)
MCV: 75.3 fL — ABNORMAL LOW (ref 80.0–100.0)
Platelets: 355 10*3/uL (ref 150–400)
RBC: 3.44 MIL/uL — ABNORMAL LOW (ref 3.87–5.11)
RDW: 14.3 % (ref 11.5–15.5)
WBC: 16.6 10*3/uL — ABNORMAL HIGH (ref 4.0–10.5)
nRBC: 1.5 % — ABNORMAL HIGH (ref 0.0–0.2)

## 2019-02-19 LAB — FERRITIN: Ferritin: 1438 ng/mL — ABNORMAL HIGH (ref 11–307)

## 2019-02-19 LAB — D-DIMER, QUANTITATIVE: D-Dimer, Quant: 1.29 ug/mL-FEU — ABNORMAL HIGH (ref 0.00–0.50)

## 2019-02-19 LAB — C-REACTIVE PROTEIN: CRP: <0.5 mg/dL (ref <1.0)

## 2019-02-19 MED ORDER — INSULIN ASPART 100 UNIT/ML ~~LOC~~ SOLN
2.0000 [IU] | Freq: Three times a day (TID) | SUBCUTANEOUS | Status: DC
  Administered 2019-02-19 – 2019-02-20 (×3): 2 [IU] via SUBCUTANEOUS

## 2019-02-19 NOTE — Progress Notes (Signed)
PROGRESS NOTE    Leyana Whidden Ayers-Farrar  ZSW:[id number] DOB: [birth year] DOA: 02/06/2019 PCP: [name], MD   Brief Narrative:  The patient is a 69 year old female with past medical history of hypertension, CKD 4 with  baseline creatinine of 3, type 2 diabetes, tobacco use, hypoglycemic episodes noted in the past, peripheral vascular disease, multiple syncopal episodes in the past with loop recorder placed 06/20/2016 without evidence of arrhythmia and follows with cardiology, who presented to the ED on 1/8 for syncopal episode of unclear etiology.  She was found to be COVID-19 positive on admission along with AKI. -  Was not started on COVID-19 directed treatment until 1/12 as below. 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 lasix 80mg  BID -Urine output is finally improving, 1900 hrs. 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 Sallyanne Kuster, interrogated 02/06/2019 - unremarkable -Limited echo unremarkable 1/10 -No syncopal episodes noted in the hospital   Type 2 Diabetes; has had periods of hyperglycemia and hypoglycemia and currently was hypoglycemic last night -Follows with endocrinology, concern for hypoglycemic unawareness in the past -HA1C 7.1 on 02/07/19 -Does not check blood sugar regularly at home -Outpatient meds:Lantus 14 units daily, Regular 12 u with breakfast, Regular 5-7 u with dinner, CBG stable on her lower dose of insulin, anticipate insulin requirement will go down further as she comes off Decadron after today -Decrease meal coverage to 4 units, decrease Lantus dose to 8 units daily   SIRS secondary to COVID-19 -Initially did not have hypoxia or respiratory symptoms -On 1/12 developed fever, tachypnea, 2 L O2 requirement and chest x-ray which showed lower lobe airspace disease concerning for pneumonia -She was started on IV remdesivir and Decadron 1/12, completed 5-day course of remdesivir, on day 10 of Decadron 1/21 -O2 requirements have improved, currently weaned off to room air   Hypomagnesemia -Improved, repleted  Hyperlipidemia  -C/w Atorvastatin 20 mg po Daily   Hypertension, difficulty to control in the past -Increased Amlodipine to 10 mg -Continue with Furosemide 80 mg p.o. twice daily -Discontinued labetalol for concern  of hypoglycemia  unawareness -Stable  PVD -Continue Cilostazol 100 mg po BID and with ASA 325 mg po BID and Atorvastatin 20 mg po qHS  Iron Deficiency Anemia -Anemia panel done recently showed an iron level of 20, U IBC of 155, TIBC 175, saturation ratios of 11%, ferritin level of 101 -Continued iron supplement, given IV iron x2 doses, last dose 1/19 -Continue Aranesp -Hemoglobin is slightly lower than baseline, monitor  Tobacco use with Emphysema -Smokes half pack per day -Tobacco cessation provided  Atraumatic right eyelid swelling, unknown etiology, resolved -Continue eyedrops with Liquifilm Tears 1 drop Right Eye as Needed  GERD -C/w Pantoprazole 40 mg po Daily   Hyperkalemia -Improved, renal diet  GOC: DNR poA  DVT prophylaxis: Heparin 5,000 units sq q8h  Code Status: DNR Family Communication: No family at bedside, updated daughter 1/21 Disposition Plan: Pending improvement in kidney function likely 1 to 2 days  Consultants:   Nephrology   Procedures: None   Antimicrobials:  Anti-infectives (From admission, onward)   Start     Dose/Rate Route Frequency Ordered Stop   02/11/19 1000  remdesivir 100 mg in sodium chloride 0.9 % 100 mL IVPB     100 mg 200 mL/hr over 30 Minutes Intravenous Daily 02/10/19 0803 02/14/19 1238   02/10/19 1000  remdesivir 200 mg in sodium chloride 0.9% 250 mL IVPB     200 mg 580 mL/hr over 30 Minutes Intravenous Once 02/10/19 0803 02/10/19 1145     Subjective: -Feels well, no complaints, anxious to go home  Objective: Vitals:   02/18/19 0815 02/18/19 1456 02/18/19 1900 02/19/19 0459  BP: (!) 158/60 (!) 154/60 (!) 162/59 (!) 142/66  Pulse:  (!) 103 (!) 104   Resp: 20  15   Temp:  (!) 97.5 F (36.4 C) (!) 97.3 F (36.3 C) 97.9 F (36.6 C)  TempSrc:  Oral Oral Oral  SpO2: 95% 96% 98%   Weight:      Height:        Intake/Output Summary (Last 24 hours) at 02/19/2019 1431 Last data filed at 02/19/2019 3419 Gross per 24 hour    Intake 360 ml  Output 2100 ml  Net -1740 ml   Filed Weights   02/13/19 0542 02/15/19 0450 02/17/19 0500  Weight: 70.6 kg 76.1 kg 70.4 kg   Examination:  Gen: 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 sitting up in a recliner, AAO x3, no distress HEENT: PERRLA, Neck supple, no JVD Lungs: Improving air movement CVS: RRR,No Gallops,Rubs or new Murmurs Abd: soft, Non tender, non distended, BS present Extremities: No edema Skin: no new rashes   Data Reviewed: I have personally reviewed following labs and imaging studies  CBC: Recent Labs  Lab 02/14/19 1629 02/14/19 1629 02/15/19 0915 02/16/19 0639 02/17/19 0500 02/18/19 0619 02/19/19 0543  WBC 15.2*   < > 17.1* 16.4* 18.4* 16.2* 16.6*  NEUTROABS 13.4*  --  14.4* 12.8* 14.5* 13.4*  --   HGB 10.9*   < > 10.4* 9.2* 10.3* 9.4* 8.8*  HCT 32.6*   < > 29.7* 26.7* 30.6* 27.8* 25.9*  MCV 76.2*   < > 74.6* 75.0* 75.0* 75.1* 75.3*  PLT 353   < > 355 315 394 381 355   < > = values in this interval not displayed.   Basic Metabolic Panel: Recent Labs  Lab 02/13/19 0249 02/14/19 0928 02/15/19 0351 02/16/19 0639 02/17/19 0500 02/18/19 0619 02/19/19 0543  NA 128*   < > 126* 125* 126* 127* 128*  K 5.1   < >  5.0 4.9 5.0 4.8 5.1  CL 97*   < > 95* 93* 92* 91* 93*  CO2 17*   < > 17* 16* 19* 20* 21*  GLUCOSE 211*   < > 56* 158* 107* 102* 120*  BUN 91*   < > 112* 112* 116* 117* 113*  CREATININE 6.09*   < > 6.46* 6.81* 6.68* 6.51* 5.73*  CALCIUM 8.0*   < > 7.7* 7.5* 8.0* 7.8* 7.8*  MG 1.8  --   --  1.8 2.0 1.8  --   PHOS 4.9*   < > 5.0* 6.0* 5.9* 6.0* 5.7*   < > = values in this interval not displayed.   GFR: Estimated Creatinine Clearance: 9.1 mL/min (A) (by C-G formula based on SCr of 5.73 mg/dL (H)). Liver Function Tests: Recent Labs  Lab 02/13/19 0249 02/14/19 0928 02/15/19 0351 02/16/19 0639 02/17/19 0500 02/18/19 0619 02/19/19 0543  AST 33  --   --  53* 53* 41  --   ALT 43  --   --  75* 98* 94*  --   ALKPHOS 78  --   --  84 99 85  --    BILITOT 0.6  --   --  0.9 0.9 0.7  --   PROT 5.3*  --   --  5.1* 6.0* 5.2*  --   ALBUMIN 2.5*   < > 2.6* 2.4* 3.1* 2.7* 2.6*   < > = values in this interval not displayed.   No results for input(s): LIPASE, AMYLASE in the last 168 hours. No results for input(s): AMMONIA in the last 168 hours. Coagulation Profile: No results for input(s): INR, PROTIME in the last 168 hours. Cardiac Enzymes: No results for input(s): CKTOTAL, CKMB, CKMBINDEX, TROPONINI in the last 168 hours. BNP (last 3 results) No results for input(s): PROBNP in the last 8760 hours. HbA1C: No results for input(s): HGBA1C in the last 72 hours. CBG: Recent Labs  Lab 02/18/19 1129 02/18/19 1533 02/18/19 2118 02/19/19 0808 02/19/19 1118  GLUCAP 199* 241* 195* 111* 284*   Lipid Profile: No results for input(s): CHOL, HDL, LDLCALC, TRIG, CHOLHDL, LDLDIRECT in the last 72 hours. Thyroid Function Tests: No results for input(s): TSH, T4TOTAL, FREET4, T3FREE, THYROIDAB in the last 72 hours. Anemia Panel: Recent Labs    02/18/19 0619 02/19/19 0543  FERRITIN 1,420* 1,438*   Sepsis Labs: No results for input(s): PROCALCITON, LATICACIDVEN in the last 168 hours.  Recent Results (from the past 240 hour(s))  Culture, blood (routine x 2)     Status: None   Collection Time: 02/10/19 10:33 AM   Specimen: BLOOD  Result Value Ref Range Status   Specimen Description BLOOD RIGHT ANTECUBITAL  Final   Special Requests AEROBIC BOTTLE ONLY Blood Culture adequate volume  Final   Culture   Final    NO GROWTH 5 DAYS Performed at [facility], [address] [place], [place] [address]    Report Status 02/15/2019 FINAL  Final  Culture, blood (routine x 2)     Status: None   Collection Time: 02/10/19 10:33 AM   Specimen: BLOOD  Result Value Ref Range Status   Specimen Description BLOOD RIGHT ANTECUBITAL  Final   Special Requests   Final    BOTTLES DRAWN AEROBIC AND ANAEROBIC Blood Culture adequate volume   Culture    Final    NO GROWTH 5 DAYS Performed at [facility], [place] [address], [place], [place] [address]    Report  Status 02/15/2019 FINAL  Final    Radiology Studies: DG CHEST PORT 1 VIEW  Result Date: 02/18/2019 CLINICAL DATA:  Shortness of breath EXAM: PORTABLE CHEST 1 VIEW COMPARISON:  02/12/2019 FINDINGS: The heart size and mediastinal contours are within normal limits. Implantable loop recorder. Mild, diffuse interstitial pulmonary opacity, slightly improved compared to prior examination. No new or focal airspace opacity. Chronic scarring of the peripheral left upper lobe. The visualized skeletal structures are unremarkable. IMPRESSION: Mild, diffuse interstitial pulmonary opacity, slightly improved compared to prior examination likely reflecting mild edema and/or chronic interstitial change. No new or focal airspace opacity. Electronically Signed   By: Eddie Candle M.D.   On: 02/18/2019 08:24   US Abdomen Limited RUQ  Result Date: 02/17/2019 CLINICAL DATA:  Elevated liver function tests. EXAM: ULTRASOUND ABDOMEN LIMITED RIGHT UPPER QUADRANT COMPARISON:  06/09/2017 CT FINDINGS: Gallbladder: Stones at up to 6 mm. No wall thickening or pericholecystic fluid. Sonographic Murphy's sign was not elicited. Common bile duct: Diameter: Normal, 6 mm. Liver: No focal lesion identified. Within normal limits in parenchymal echogenicity. Portal vein is patent on color Doppler imaging with normal direction of blood flow towards the liver. Other: Small volume perihepatic ascites. Increased right renal echogenicity. IMPRESSION: 1. Cholelithiasis without acute cholecystitis or biliary duct dilatation. 2. Small volume perihepatic ascites. 3. Hyperechoic right kidney, consistent with medical renal disease. Electronically Signed   By: Abigail Miyamoto M.D.   On: 02/17/2019 19:35    Scheduled Meds: . amLODipine  10 mg Oral Daily  . vitamin C  500 mg Oral Daily  . aspirin  325 mg Oral Daily  . atorvastatin  20 mg Oral  q1800  . cilostazol  100 mg Oral BID  . darbepoetin (ARANESP) injection - NON-DIALYSIS  40 mcg Subcutaneous Q Mon-1800  . furosemide  80 mg Oral BID  . gabapentin  100 mg Oral BID  . heparin  5,000 Units Subcutaneous Q8H  . insulin aspart  0-5 Units Subcutaneous QHS  . insulin aspart  0-9 Units Subcutaneous TID WC  . insulin aspart  4 Units Subcutaneous TID WC  . insulin detemir  8 Units Subcutaneous Daily  . pantoprazole  40 mg Oral Daily  . senna-docusate  1 tablet Oral QHS  . sodium bicarbonate  1,300 mg Oral BID  . zinc sulfate  220 mg Oral Daily   Continuous Infusions:   LOS: 12 days   Teofila Bowery,MD [facility]

## 2019-02-19 NOTE — Progress Notes (Signed)
Subjective:    SCr and BUN improved  Nearly 2L UOP yesterday  Cont to feel well, no c/o  Dtr updated by phone  Objective Vital signs in last 24 hours: Vitals:   02/18/19 0815 02/18/19 1456 02/18/19 1900 02/19/19 0459  BP: (!) 158/60 (!) 154/60 (!) 162/59 (!) 142/66  Pulse:  (!) 103 (!) 104   Resp: 20  15   Temp:  (!) 97.5 F (36.4 C) (!) 97.3 F (36.3 C) 97.9 F (36.6 C)  TempSrc:  Oral Oral Oral  SpO2: 95% 96% 98%   Weight:      Height:       Weight change:   Intake/Output Summary (Last 24 hours) at 02/19/2019 1030 Last data filed at 02/19/2019 0755 Gross per 24 hour  Intake 240 ml  Output 2750 ml  Net -2510 ml    Assessment/ Plan: Pt is a 69 y.o. yo female with blindness, DM, HTN, PAD and CKD 4-  crt around 3- followed by [place] who was admitted on 02/06/2019 with COVID/syncope - has developed some A on CRF that is slow to resolve Assessment/Plan: 1. Renal-  Has CKD at baseline, pretty advanced.  Developed A on CRF in the setting of above.  No nephrotoxins and no obvious BP drops-  U/A indicative of hemodynamic change- min protein and no cells.  UOP picking up and SCR/BUN imprved.  Apperas will recover, would watch x1 more day and if futher improved tomorrow ok for DC and outpt f/u at [place].    2. HTN/vol-  Seemed euvolemic-  Cont BID lasix for now  3. Anemia- fairly stable -  Iron was low, gave feraheme on 1/13 and 1/17 Cont ESA, CTM 4. Secondary hyperparathyroidism- no recent PTH--  Phos is OK in 5s 5. COVID-  Per primary-  Has done well-  On RA  6. Hyponatremia-  CTM, ASx, slowly improving 7.  Metabolic acidosis -  Cont NaHCO3   Rexene Agent    Labs: Basic Metabolic Panel: Recent Labs  Lab 02/17/19 0500 02/18/19 0619 02/19/19 0543  NA 126* 127* 128*  K 5.0 4.8 5.1  CL 92* 91* 93*  CO2 19* 20* 21*  GLUCOSE 107* 102* 120*  BUN 116* 117* 113*  CREATININE 6.68* 6.51* 5.73*  CALCIUM 8.0* 7.8* 7.8*  PHOS 5.9* 6.0* 5.7*   Liver Function Tests: Recent  Labs  Lab 02/16/19 0639 02/16/19 0639 02/17/19 0500 02/18/19 0619 02/19/19 0543  AST 53*  --  53* 41  --   ALT 75*  --  98* 94*  --   ALKPHOS 84  --  99 85  --   BILITOT 0.9  --  0.9 0.7  --   PROT 5.1*  --  6.0* 5.2*  --   ALBUMIN 2.4*   < > 3.1* 2.7* 2.6*   < > = values in this interval not displayed.   No results for input(s): LIPASE, AMYLASE in the last 168 hours. No results for input(s): AMMONIA in the last 168 hours. CBC: Recent Labs  Lab 02/15/19 0915 02/15/19 0915 02/16/19 4098 02/16/19 0639 02/17/19 0500 02/18/19 0619 02/19/19 0543  WBC 17.1*   < > 16.4*   < > 18.4* 16.2* 16.6*  NEUTROABS 14.4*   < > 12.8*  --  14.5* 13.4*  --   HGB 10.4*   < > 9.2*   < > 10.3* 9.4* 8.8*  HCT 29.7*   < > 26.7*   < > 30.6* 27.8* 25.9*  MCV  74.6*  --  75.0*  --  75.0* 75.1* 75.3*  PLT 355   < > 315   < > 394 381 355   < > = values in this interval not displayed.   Cardiac Enzymes: No results for input(s): CKTOTAL, CKMB, CKMBINDEX, TROPONINI in the last 168 hours. CBG: Recent Labs  Lab 02/18/19 0735 02/18/19 1129 02/18/19 1533 02/18/19 2118 02/19/19 0808  GLUCAP 87 199* 241* 195* 111*    Iron Studies:  Recent Labs    02/19/19 0543  FERRITIN 1,438*   Studies/Results: DG CHEST PORT 1 VIEW  Result Date: 02/18/2019 CLINICAL DATA:  Shortness of breath EXAM: PORTABLE CHEST 1 VIEW COMPARISON:  02/12/2019 FINDINGS: The heart size and mediastinal contours are within normal limits. Implantable loop recorder. Mild, diffuse interstitial pulmonary opacity, slightly improved compared to prior examination. No new or focal airspace opacity. Chronic scarring of the peripheral left upper lobe. The visualized skeletal structures are unremarkable. IMPRESSION: Mild, diffuse interstitial pulmonary opacity, slightly improved compared to prior examination likely reflecting mild edema and/or chronic interstitial change. No new or focal airspace opacity. Electronically Signed   By: Eddie Candle  M.D.   On: 02/18/2019 08:24   US Abdomen Limited RUQ  Result Date: 02/17/2019 CLINICAL DATA:  Elevated liver function tests. EXAM: ULTRASOUND ABDOMEN LIMITED RIGHT UPPER QUADRANT COMPARISON:  06/09/2017 CT FINDINGS: Gallbladder: Stones at up to 6 mm. No wall thickening or pericholecystic fluid. Sonographic Murphy's sign was not elicited. Common bile duct: Diameter: Normal, 6 mm. Liver: No focal lesion identified. Within normal limits in parenchymal echogenicity. Portal vein is patent on color Doppler imaging with normal direction of blood flow towards the liver. Other: Small volume perihepatic ascites. Increased right renal echogenicity. IMPRESSION: 1. Cholelithiasis without acute cholecystitis or biliary duct dilatation. 2. Small volume perihepatic ascites. 3. Hyperechoic right kidney, consistent with medical renal disease. Electronically Signed   By: Abigail Miyamoto M.D.   On: 02/17/2019 19:35   Medications: Infusions:   Scheduled Medications: . amLODipine  10 mg Oral Daily  . vitamin C  500 mg Oral Daily  . aspirin  325 mg Oral Daily  . atorvastatin  20 mg Oral q1800  . cilostazol  100 mg Oral BID  . darbepoetin (ARANESP) injection - NON-DIALYSIS  40 mcg Subcutaneous Q Mon-1800  . furosemide  80 mg Oral BID  . gabapentin  100 mg Oral BID  . heparin  5,000 Units Subcutaneous Q8H  . insulin aspart  0-5 Units Subcutaneous QHS  . insulin aspart  0-9 Units Subcutaneous TID WC  . insulin aspart  4 Units Subcutaneous TID WC  . insulin detemir  8 Units Subcutaneous Daily  . pantoprazole  40 mg Oral Daily  . senna-docusate  1 tablet Oral QHS  . sodium bicarbonate  1,300 mg Oral BID  . zinc sulfate  220 mg Oral Daily    have reviewed scheduled and prn medications.  Physical Exam: Gen-  NAD- up in bedside chair Lungs-  Mostly clear CV- RRR to tachy  abd- soft, non tender Ext-  Really no edema   02/19/2019,10:30 AM  LOS: 12 days

## 2019-02-20 DIAGNOSIS — J9601 Acute respiratory failure with hypoxia: Secondary | ICD-10-CM

## 2019-02-20 DIAGNOSIS — R945 Abnormal results of liver function studies: Secondary | ICD-10-CM

## 2019-02-20 LAB — CBC
HCT: 27.2 % — ABNORMAL LOW (ref 36.0–46.0)
Hemoglobin: 9.1 g/dL — ABNORMAL LOW (ref 12.0–15.0)
MCH: 25.8 pg — ABNORMAL LOW (ref 26.0–34.0)
MCHC: 33.5 g/dL (ref 30.0–36.0)
MCV: 77.1 fL — ABNORMAL LOW (ref 80.0–100.0)
Platelets: 338 10*3/uL (ref 150–400)
RBC: 3.53 MIL/uL — ABNORMAL LOW (ref 3.87–5.11)
RDW: 15.2 % (ref 11.5–15.5)
WBC: 16.6 10*3/uL — ABNORMAL HIGH (ref 4.0–10.5)
nRBC: 1.2 % — ABNORMAL HIGH (ref 0.0–0.2)

## 2019-02-20 LAB — RENAL FUNCTION PANEL
Albumin: 2.7 g/dL — ABNORMAL LOW (ref 3.5–5.0)
Anion gap: 17 — ABNORMAL HIGH (ref 5–15)
BUN: 109 mg/dL — ABNORMAL HIGH (ref 8–23)
CO2: 20 mmol/L — ABNORMAL LOW (ref 22–32)
Calcium: 7.8 mg/dL — ABNORMAL LOW (ref 8.9–10.3)
Chloride: 90 mmol/L — ABNORMAL LOW (ref 98–111)
Creatinine, Ser: 5.56 mg/dL — ABNORMAL HIGH (ref 0.44–1.00)
GFR calc Af Amer: 9 mL/min — ABNORMAL LOW (ref >60)
GFR calc non Af Amer: 7 mL/min — ABNORMAL LOW (ref >60)
Glucose, Bld: 227 mg/dL — ABNORMAL HIGH (ref 70–99)
Phosphorus: 4.8 mg/dL — ABNORMAL HIGH (ref 2.5–4.6)
Potassium: 5.1 mmol/L (ref 3.5–5.1)
Sodium: 127 mmol/L — ABNORMAL LOW (ref 135–145)

## 2019-02-20 LAB — GLUCOSE, CAPILLARY
Glucose-Capillary: 176 mg/dL — ABNORMAL HIGH (ref 70–99)
Glucose-Capillary: 216 mg/dL — ABNORMAL HIGH (ref 70–99)

## 2019-02-20 MED ORDER — ACETAMINOPHEN 325 MG PO TABS: 650.0000 mg | ORAL_TABLET | Freq: Four times a day (QID) | ORAL | Status: AC | PRN

## 2019-02-20 MED ORDER — FUROSEMIDE 80 MG PO TABS: 80.0000 mg | ORAL_TABLET | Freq: Two times a day (BID) | ORAL | 0 refills | Status: DC

## 2019-02-20 MED ORDER — INSULIN GLARGINE 100 UNIT/ML ~~LOC~~ SOLN: 8.0000 [IU] | Freq: Every day | SUBCUTANEOUS | 11 refills | Status: DC

## 2019-02-20 MED ORDER — INSULIN REGULAR HUMAN 100 UNIT/ML IJ SOLN: 3.0000 [IU] | Freq: Three times a day (TID) | INTRAMUSCULAR | Status: DC

## 2019-02-20 NOTE — Progress Notes (Signed)
Subjective:    SCr and BUN improved again today, K5.1, sodium 127  1.8 L urine output  No nausea/vomiting, tolerating food  No edema  Dtr updated by phone  Objective Vital signs in last 24 hours: Vitals:   02/20/19 0427 02/20/19 0551 02/20/19 0800 02/20/19 0914  BP:  (!) 141/67  (!) 165/64  Pulse:  (!) 106    Resp:  16 14 (!) 22  Temp:  98.6 F (37 C)    TempSrc:  Oral    SpO2:  96%  98%  Weight: 74 kg     Height:       Weight change:   Intake/Output Summary (Last 24 hours) at 02/20/2019 1054 Last data filed at 02/20/2019 0900 Gross per 24 hour  Intake 820 ml  Output 1400 ml  Net -580 ml    Assessment/ Plan: Pt is a 69 y.o. yo female with blindness, DM, HTN, PAD and CKD 4-  crt around 3- followed by [place] who was admitted on 02/06/2019 with COVID/syncope - has developed some A on CRF that is slow to resolve Assessment/Plan: 1. Renal-  Has CKD at baseline, pretty advanced.  Developed A on CRF in the setting of above.  No nephrotoxins and no obvious BP drops-  U/A indicative of hemodynamic change- min protein and no cells. Continues to show GFR recovery, slow but steady.  I think okay for discharge and I will arrange close follow-up outside of the 21-day window with [place] in the office with labs prior.   2. HTN/vol-  euvolemic-  Cont BID lasix for now upon discharge 3. Anemia- fairly stable -  Iron was low, gave feraheme on 1/13 and 1/17 Cont ESA, CTM 4. Secondary hyperparathyroidism- no recent PTH--  Phos is OK  5. COVID-  Per primary-  Has done well-  On RA  6. Hyponatremia-  CTM, ASx, slowly improving 7.  Metabolic acidosis -  Cont NaHCO3   Rexene Agent    Labs: Basic Metabolic Panel: Recent Labs  Lab 02/18/19 0619 02/19/19 0543 02/20/19 0459  NA 127* 128* 127*  K 4.8 5.1 5.1  CL 91* 93* 90*  CO2 20* 21* 20*  GLUCOSE 102* 120* 227*  BUN 117* 113* 109*  CREATININE 6.51* 5.73* 5.56*  CALCIUM 7.8* 7.8* 7.8*  PHOS 6.0* 5.7* 4.8*   Liver Function  Tests: Recent Labs  Lab 02/16/19 0639 02/16/19 0639 02/17/19 0500 02/17/19 0500 02/18/19 0619 02/19/19 0543 02/20/19 0459  AST 53*  --  53*  --  41  --   --   ALT 75*  --  98*  --  94*  --   --   ALKPHOS 84  --  99  --  85  --   --   BILITOT 0.9  --  0.9  --  0.7  --   --   PROT 5.1*  --  6.0*  --  5.2*  --   --   ALBUMIN 2.4*   < > 3.1*   < > 2.7* 2.6* 2.7*   < > = values in this interval not displayed.   No results for input(s): LIPASE, AMYLASE in the last 168 hours. No results for input(s): AMMONIA in the last 168 hours. CBC: Recent Labs  Lab 02/16/19 0639 02/16/19 0639 02/17/19 0500 02/17/19 0500 02/18/19 0619 02/19/19 0543 02/20/19 0459  WBC 16.4*   < > 18.4*   < > 16.2* 16.6* 16.6*  NEUTROABS 12.8*  --  14.5*  --  13.4*  --   --   HGB 9.2*   < > 10.3*   < > 9.4* 8.8* 9.1*  HCT 26.7*   < > 30.6*   < > 27.8* 25.9* 27.2*  MCV 75.0*  --  75.0*  --  75.1* 75.3* 77.1*  PLT 315   < > 394   < > 381 355 338   < > = values in this interval not displayed.   Cardiac Enzymes: No results for input(s): CKTOTAL, CKMB, CKMBINDEX, TROPONINI in the last 168 hours. CBG: Recent Labs  Lab 02/19/19 1118 02/19/19 1714 02/19/19 2146 02/19/19 2246 02/20/19 0739  GLUCAP 284* 330* 300* 277* 176*    Iron Studies:  Recent Labs    02/19/19 0543  FERRITIN 1,438*   Studies/Results: No results found. Medications: Infusions:   Scheduled Medications: . amLODipine  10 mg Oral Daily  . vitamin C  500 mg Oral Daily  . aspirin  325 mg Oral Daily  . atorvastatin  20 mg Oral q1800  . cilostazol  100 mg Oral BID  . darbepoetin (ARANESP) injection - NON-DIALYSIS  40 mcg Subcutaneous Q Mon-1800  . furosemide  80 mg Oral BID  . gabapentin  100 mg Oral BID  . heparin  5,000 Units Subcutaneous Q8H  . insulin aspart  0-5 Units Subcutaneous QHS  . insulin aspart  0-9 Units Subcutaneous TID WC  . insulin aspart  2 Units Subcutaneous TID WC  . insulin detemir  8 Units Subcutaneous Daily   . pantoprazole  40 mg Oral Daily  . senna-docusate  1 tablet Oral QHS  . sodium bicarbonate  1,300 mg Oral BID  . zinc sulfate  220 mg Oral Daily    have reviewed scheduled and prn medications.  Physical Exam: Gen-  NAD- up in bedside chair Lungs-  Mostly clear CV- RRR to tachy  abd- soft, non tender Ext-  Really no edema   02/20/2019,10:54 AM  LOS: 13 days

## 2019-02-20 NOTE — Discharge Summary (Signed)
Physician Discharge Summary  Julie Brewer QJJ:[id number] DOB: 1950-04-16 DOA: 02/06/2019  PCP: [name], MD  Admit date: 02/06/2019 Discharge date: 02/20/2019  Time spent: 35 minutes  Recommendations for Outpatient Follow-up:  1. [facility] Dr. Johnney Ou in 1 week with labs 2. Needs dialysis access planning 3. PCP in 1 week, please monitor CBGs and titrate insulin dose as needed   Discharge Diagnoses:  Acute kidney injury on CKD 4 Metabolic acidosis Syncope Type 2 diabetes mellitus COVID-19 pneumonia Hypertension Iron deficiency anemia Peripheral vascular disease COPD Tobacco abuse Dyslipidemia DO NOT RESUSCITATE  Discharge Condition: improved  Diet recommendation: Renal Diabetic  Filed Weights   02/15/19 0450 02/17/19 0500 02/20/19 0427  Weight: 76.1 kg 70.4 kg 74 kg    History of present illness:  69 year old female with past medical history of hypertension, CKD 4 with  baseline creatinine of 3, type 2 diabetes, tobacco use, hypoglycemic episodes noted in the past, peripheral vascular disease, multiple syncopal episodes in the past with loop recorder placed 06/20/2016 without evidence of arrhythmia and follows with cardiology, who presented to the ED on 1/8 for syncopal episode of unclear etiology. She was found to be COVID-19 positive on admission along with AKI.   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 lasix 80mg  BID -Urine output is finally improving considerably and has had no acute indications for dialysis during this hospitalization of over a week -Anticipate she will need dialysis in a few months, she is followed by Dr. Johnney Ou with nephrology -Creatinine is 5.5 at discharge she is euvolemic with good urine output and no symptoms of uremia -Per nephrology, she is felt to be stable for discharge and they will arrange close follow-up in the office with Dr. 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 Sallyanne Kuster, interrogated 02/06/2019 - unremarkable -Limited echo unremarkable1/10 -No syncopal episodes noted in the hospital -Insulin dose lowered from baseline   Type 2 Diabetes; has had periods of hyperglycemia and hypoglycemia and currently was hypoglycemic last night -Follows with endocrinology, concern for hypoglycemic unawareness in the past -HA1C 7.1 on 02/07/19 -Does not check blood sugar regularly at home -Outpatient meds:Lantus 14 units daily, Regular 12uwith breakfast, Regular 5-7uwithdinner, CBG stable on her lower dose of insulin, -Decrease meal coverage to 3 units, decrease Lantus dose to 8 units daily   SIRSsecondary to  COVID-19 -Initially did not have hypoxia or respiratory symptoms -  On 1/12 developed fever, tachypnea, 2 L O2 requirement and chest x-ray which showed lower lobe airspace disease concerning for pneumonia -She was started on IV remdesivir and Decadron 1/12, completed 5-day course of remdesivir, on day 10 of Decadron 1/21 -O2 requirements have improved, currently weaned off to room air  Hypomagnesemia -Improved, repleted  Hyperlipidemia  -C/w Atorvastatin 20 mg po Daily   Hypertension, difficulty to control in the past -Increased Amlodipine to 10 mg -Continue with Furosemide 80 mg p.o. twice daily -Discontinued labetalol for concern of hypoglycemia unawareness -Stable  PVD -Continue Cilostazol 100 mg po BID and with ASA 325 mg po BID and Atorvastatin 20 mg po qHS  Iron Deficiency Anemia -Anemia panel done recently showed an iron level of 20, U IBC of 155, TIBC 175, saturation ratios of 11%, ferritin level of 101 -Continued iron supplement, given IV iron x2 doses, last dose 1/19 -Continue Aranesp -Hemoglobin is slightly lower than baseline, monitor  Tobacco use with Emphysema -Smokes half pack per day -Tobacco cessation provided  Atraumatic righteyelidswelling, unknown etiology, resolved -Continue eyedrops with Liquifilm Tears 1 drop Right Eye as Needed  GERD -C/w Pantoprazole 40 mg po Daily   Hyperkalemia -Improved, renal diet  GOC: DNR poA  Code Status: DNR  Discharge Exam: Vitals:   02/20/19 0800 02/20/19 0914  BP:  (!) 165/64  Pulse:    Resp: 14 (!) 22  Temp:    SpO2:  98%    General: AAOx3 Cardiovascular: S1S2/RRR Respiratory: CTAB  Discharge Instructions   Discharge Instructions    Diet - low sodium heart healthy   Complete by: As directed    Diet Carb Modified   Complete by: As directed    Increase activity slowly   Complete by: As directed      Allergies as of 02/20/2019      Reactions   Morphine And Related Other (See Comments)    Hallucinations    Penicillins Swelling, Rash   Throat swelling Did it involve swelling of the face/tongue/throat, SOB, or low BP? Yes Did it involve sudden or severe rash/hives, skin peeling, or any reaction on the inside of your mouth or nose? Yes Did you need to seek medical attention at a hospital or doctor's office? Yes When did it last happen?young child If all above answers are "NO", may proceed with cephalosporin use.      Medication List    STOP taking these medications   labetalol 100 MG tablet Commonly known as: NORMODYNE     TAKE these medications   acetaminophen 325 MG tablet Commonly known as: TYLENOL Take 2 tablets (650 mg total) by mouth every 6 (six) hours as needed for mild pain (or Fever >/= 101).   amLODipine 5 MG tablet Commonly known as: NORVASC Take 5 mg by mouth daily.   aspirin 325 MG EC tablet Take 325 mg by mouth daily.   atorvastatin 20 MG tablet Commonly known as: LIPITOR Take 1 tablet by mouth once daily   bisacodyl 5 MG EC tablet Commonly known as: DULCOLAX Take 5 mg by mouth daily as needed for moderate constipation.   cilostazol 100 MG tablet Commonly known as: PLETAL Take 1 tablet by mouth twice daily   docusate sodium 100 MG capsule Commonly known as: COLACE Take 200 mg by mouth daily.   ferrous sulfate 325 (65 FE) MG tablet Take 650 mg by mouth daily.   FREESTYLE LITE test strip Generic drug: glucose blood USE AS INSTRUCTED TO CHECK BLOOD SUGAR ONCE  DAILY; E11.8   furosemide 80 MG tablet Commonly known as: LASIX Take 1 tablet (80 mg total) by mouth 2 (two) times daily. What changed:   medication strength  how much to take  when to take this   gabapentin 100 MG capsule Commonly known as: NEURONTIN Take 1 capsule by mouth twice daily   insulin glargine 100 UNIT/ML injection Commonly known as: LANTUS Inject 0.08 mLs (8 Units total) into the skin daily. What changed: how much to take   insulin regular 100  units/mL injection Commonly known as: NovoLIN R ReliOn Inject 0.03 mLs (3 Units total) into the skin 3 (three) times daily before mealsWhat changed:   how much to take  when to take this   omeprazole 20 MG capsule Commonly known as: PRILOSEC TAKE 1 CAPSULE BY MOUTH ONCE DAILY ( DUE FOR OFFICE VISIT THIS MONTH) What changed: See the new instructions.   pantoprazole 40 MG tablet Commonly known as: PROTONIX Take 1 tablet (40 mg total) by mouth daily for 30 days.   Vitamin D (Ergocalciferol) 1.25 MG (50000 UNIT) Caps capsule Commonly known as: DRISDOL TAKE ONE CAPSULE BY MOUTH EVERY 7 DAYS What changed: See the new instructions.      Allergies  Allergen Reactions  . Morphine And Related Other (See Comments)    Hallucinations   . Penicillins Swelling and Rash    Throat swelling Did it involve swelling of the face/tongue/throat, SOB, or low BP? Yes Did it involve sudden or severe rash/hives, skin peeling, or any reaction on the inside of your mouth or nose? Yes Did you need to seek medical attention at a hospital or doctor's office? Yes When did it last happen?young child If all above answers are "NO", may proceed with cephalosporin use.   Follow-up Information    Justin Mend, MD Follow up.   Specialty: Internal Medicine Why: We will call with an appointment and arrange labs prior Contact information: [place] [address] [phone]            The results of significant diagnostics from this hospitalization (including imaging, microbiology, ancillary and laboratory) are listed below for reference.    Significant Diagnostic Studies: CT HEAD WO CONTRAST  Result Date: 02/06/2019 CLINICAL DATA:  Syncope. Episode of unresponsiveness. No reported injury. EXAM: CT HEAD WITHOUT CONTRAST TECHNIQUE: Contiguous axial images were obtained from the base of the skull through the vertex without intravenous contrast. COMPARISON:  07/17/2009 head CT. FINDINGS:  Brain: No evidence of parenchymal hemorrhage or extra-axial fluid collection. No mass lesion, mass effect, or midline shift. No CT evidence of acute infarction. Nonspecific moderate subcortical and periventricular white matter hypodensity, most in keeping with chronic small vessel ischemic change. Cerebral volume is age appropriate. No ventriculomegaly. Vascular: No acute abnormality. Skull: No evidence of calvarial fracture. Sinuses/Orbits: No fluid levels. Mild partial opacification of the right ethmoidal air cells. Other:  The mastoid air cells are unopacified. IMPRESSION: 1.  No evidence of acute intracranial abnormality. 2. Moderate chronic small vessel ischemic changes in the cerebral white matter. Electronically Signed   By: Ilona Sorrel M.D.   On: 02/06/2019 18:28   CT Chest Wo Contrast  Result Date: 02/06/2019 CLINICAL DATA:  Altered mental status. EXAM: CT CHEST WITHOUT CONTRAST TECHNIQUE: Multidetector CT imaging of the chest was performed following the standard protocol without IV contrast. COMPARISON:  None. FINDINGS: Cardiovascular: There is moderate severity calcification of the thoracic aorta. Normal heart size. No pericardial effusion. Marked severity coronary artery  calcification is seen. Mediastinum/Nodes: Lungs/Pleura: There is mild emphysematous lung disease. Mild atelectasis is seen along the posterior aspects of the bilateral lung bases. There is no evidence of a pleural effusion or pneumothorax. Upper Abdomen: Tiny gallstones are seen within the gallbladder lumen. Musculoskeletal: No chest wall mass or suspicious bone lesions identified. IMPRESSION: 1. Mild bibasilar atelectasis. 2. Mild emphysematous lung disease. Emphysema (ICD10-J43.9). Electronically Signed   By: Virgina Norfolk M.D.   On: 02/06/2019 17:06   US RENAL  Result Date: 02/09/2019 CLINICAL DATA:  Acute kidney injury. EXAM: RENAL / URINARY TRACT ULTRASOUND COMPLETE COMPARISON:  Renal ultrasound dated 06/22/2018 and CT  scan of the abdomen dated 06/09/2017 FINDINGS: Right Kidney: Renal measurements: 10.9 x 5.3 x 5.7 cm = volume: 170 mL. Increased echogenicity of the renal parenchyma. No hydronephrosis or mass lesion. Left Kidney: Renal measurements: 11.1 x 6.0 x 5.8 cm = volume: 204 mL. Increased echogenicity of the renal parenchyma. No hydronephrosis or mass lesion. Bladder: Appears normal for degree of bladder distention. Other: None. IMPRESSION: 1. No significant change in the appearance of the kidneys since the prior study. 2. Diffuse increased echogenicity of the otherwise normal appearing renal parenchyma bilaterally, consistent with renal medical disease. Electronically Signed   By: Lorriane Shire M.D.   On: 02/09/2019 15:58   DG CHEST PORT 1 VIEW  Result Date: 02/18/2019 CLINICAL DATA:  Shortness of breath EXAM: PORTABLE CHEST 1 VIEW COMPARISON:  02/12/2019 FINDINGS: The heart size and mediastinal contours are within normal limits. Implantable loop recorder. Mild, diffuse interstitial pulmonary opacity, slightly improved compared to prior examination. No new or focal airspace opacity. Chronic scarring of the peripheral left upper lobe. The visualized skeletal structures are unremarkable. IMPRESSION: Mild, diffuse interstitial pulmonary opacity, slightly improved compared to prior examination likely reflecting mild edema and/or chronic interstitial change. No new or focal airspace opacity. Electronically Signed   By: Eddie Candle M.D.   On: 02/18/2019 08:24   DG CHEST PORT 1 VIEW  Result Date: 02/12/2019 CLINICAL DATA:  Shortness of breath. COVID-19 virus infection. EXAM: PORTABLE CHEST 1 VIEW COMPARISON:  02/06/2019 FINDINGS: The heart size and mediastinal contours are within normal limits. Aortic atherosclerosis incidentally noted. Chronic pulmonary interstitial prominence is unchanged. Mild bilateral upper lobe scarring is also stable. No evidence of acute infiltrate or pleural effusion. Previously seen left lower  lobe infiltrate has resolved since prior exam. Cardiac loop recorder is again noted. IMPRESSION: No active disease. Electronically Signed   By: Marlaine Hind M.D.   On: 02/12/2019 09:01   DG Chest Port 1 View  Result Date: 02/06/2019 CLINICAL DATA:  Syncopal episode today. EXAM: PORTABLE CHEST 1 VIEW COMPARISON:  06/20/2018 FINDINGS: The heart size and mediastinal contours are within normal limits. Cardiac loop recorder seen overlying the heart. Aortic atherosclerosis incidentally noted. Pulmonary interstitial prominence again noted. Pleural effusions have resolved since prior study. Airspace opacity is seen in the retrocardiac left lung base, which is suspicious for pneumonia. IMPRESSION: Retrocardiac left lower lobe airspace disease, suspicious for pneumonia. If clinical signs/symptoms are consistent with pneumonia, recommend chest radiographic follow-up to confirm resolution. Alternatively, chest CT could be performed for further evaluation. Electronically Signed   By: Marlaine Hind M.D.   On: 02/06/2019 13:25   CUP PACEART REMOTE DEVICE CHECK  Result Date: 02/10/2019 [facility] summary report received. Battery status OK. Normal device function. No new symptom episodes, tachy episodes, brady, or pause episodes. No new AF episodes. Monthly summary reports and ROV/PRN  VAS US CAROTID  Result Date: 02/08/2019 Carotid Arterial Duplex Study Indications: Syncope. Limitations  Today's exam was limited due to the high bifurcation of the carotid              and suboptimal tissue properties. Performing Technologist: Antonieta Pert RDMS, RVT  Examination Guidelines: A complete evaluation includes B-mode imaging, spectral Doppler, color Doppler, and power Doppler as needed of all accessible portions of each vessel. Bilateral testing is considered an integral part of a complete examination. Limited examinations for reoccurring indications may be performed as noted.  Right Carotid Findings:  +----------+--------+--------+--------+-------------------------------+--------+           PSV cm/sEDV cm/sStenosisPlaque Description             Comments +----------+--------+--------+--------+-------------------------------+--------+ CCA Prox  63      13                                                      +----------+--------+--------+--------+-------------------------------+--------+ CCA Mid   100     19              focal, hyperechoic and calcific         +----------+--------+--------+--------+-------------------------------+--------+ CCA Distal111     28                                                      +----------+--------+--------+--------+-------------------------------+--------+ ICA Prox  104     27                                                      +----------+--------+--------+--------+-------------------------------+--------+ ICA Mid   96      24                                                      +----------+--------+--------+--------+-------------------------------+--------+ ICA Distal72      24                                                      +----------+--------+--------+--------+-------------------------------+--------+ ECA       168     18                                                      +----------+--------+--------+--------+-------------------------------+--------+ +----------+--------+-------+--------+-------------------+           PSV cm/sEDV cmsDescribeArm Pressure (mmHG) +----------+--------+-------+--------+-------------------+ [id number]                                        +----------+--------+-------+--------+-------------------+ +---------+--------+--+--------+--+---------+  VertebralPSV cm/s86EDV cm/s17Antegrade +---------+--------+--+--------+--+---------+ Tissue shadowing near bifrucation bilaterally. Left Carotid Findings:  +----------+--------+--------+--------+------------------------+--------+           PSV cm/sEDV cm/sStenosisPlaque Description      Comments +----------+--------+--------+--------+------------------------+--------+ CCA Prox  94      21                                               +----------+--------+--------+--------+------------------------+--------+ CCA Mid                           focal and hyperechoic            +----------+--------+--------+--------+------------------------+--------+ CCA Distal94      22              diffuse and heterogenous         +----------+--------+--------+--------+------------------------+--------+ ICA Prox  110     23                                               +----------+--------+--------+--------+------------------------+--------+ ICA Mid   111     31                                               +----------+--------+--------+--------+------------------------+--------+ ICA Distal114     26                                               +----------+--------+--------+--------+------------------------+--------+ ECA       133     21                                               +----------+--------+--------+--------+------------------------+--------+ +----------+--------+--------+--------+-------------------+           PSV cm/sEDV cm/sDescribeArm Pressure (mmHG) +----------+--------+--------+--------+-------------------+ [id number]                                         +----------+--------+--------+--------+-------------------+ +---------+--------+--+--------+--+---------+ VertebralPSV cm/s69EDV cm/s21Antegrade +---------+--------+--+--------+--+---------+ Tissue shadowing near bifrucation bilaterally. Summary: Right Carotid: Velocities in the right ICA are consistent with a 1-39% stenosis. Left Carotid: Velocities in the left ICA are consistent with a 1-39% stenosis.               Multiple nodules noted  in left thyroid. Vertebrals:  Bilateral vertebral arteries demonstrate antegrade flow. Subclavians: Normal flow hemodynamics were seen in bilateral subclavian              arteries. *See table(s) above for measurements and observations.  Electronically signed by Harold Barban MD on 02/08/2019 at 6:45:25 PM.    Final    ECHOCARDIOGRAM LIMITED  Result Date: 02/08/2019   ECHOCARDIOGRAM LIMITED REPORT   Patient Name:   Julie Brewer Date of Exam: 02/08/2019 Medical Rec #:  [id number]  Height:       63.0 in Accession #:    [id number]            Weight:       143.3 lb Date of Birth:  [birth year]              BSA:          1.68 m Patient Age:    67 years              BP:           152/64 mmHg Patient Gender: F                     HR:           86 bpm. Exam Location:  Inpatient  Procedure: Limited Color Doppler, Cardiac Doppler and Limited Echo Indications:    syncope 780.2  History:        Patient has prior history of Echocardiogram examinations, most                 recent 06/21/2018.  Sonographer:    Johny Chess Referring Phys: [id number] [place]  1. Left ventricular ejection fraction, by visual estimation, is 65 to 70%. The left ventricle has hyperdynamic function. There is mildly increased left ventricular wall thickness  2. Left ventricular diastolic parameters are indeterminate  3. Global right ventricle has normal systolc function.The right ventricular size is normal.  4. The mitral valve is normal in structure. No evidence of mitral valve regurgitation.  5. The tricuspid valve was normal in structure. Tricuspid valve regurgitation is trivial.  6. The aortic valve is tricuspid. Aortic valve regurgitation is not visualized. Mild aortic valve sclerosis is present, with no evidence of aortic valve stenosis.  7. The inferior vena cava is normal in size with greater than 50% respiratory variability, suggesting right atrial pressure of 3 mmHg.  8. TR signal is inadequate for assessing  pulmonary artery systolic pressure. FINDINGS  Left Ventricle: Left ventricular ejection fraction, by visual estimation, is 65 to 70%. The left ventricle has hyperdynamic function. The left ventricle has no regional wall motion abnormalities. There is mildly increased left ventricular wall thickness. Left ventricular diastolic parameters are indeterminate. Right Ventricle: The right ventricular size is normal. No increase in right ventricular wall thickness. Global RV systolic function is has normal systolic function. Left Atrium: Left atrial size was not assessed. Right Atrium: Right atrial size was not assessed. Right atrial pressure is estimated at 3 mmHg. Pericardium: There is no evidence of pericardial effusion is seen. There is no evidence of pericardial effusion. Mitral Valve: The mitral valve is normal in structure. MV Area by PHT, 3.48 cm. MV PHT, 63.22 msec. No evidence of mitral valve regurgitation. Tricuspid Valve: The tricuspid valve is normal in structure. Tricuspid valve regurgitation is trivial. Aortic Valve: The aortic valve is tricuspid. Aortic valve regurgitation is not visualized. Mild aortic valve sclerosis is present, with no evidence of aortic valve stenosis. Pulmonic Valve: The pulmonic valve was not well visualized. Pulmonic valve regurgitation is not visualized by color flow Doppler. Pulmonic regurgitation is not visualized by color flow Doppler. Venous: The inferior vena cava is normal in size with greater than 50% respiratory variability, suggesting right atrial pressure of 3 mmHg. Shunts: The interatrial septum was not well visualized.  LEFT VENTRICLE          Normals PLAX 2D LVIDd:         4.40  cm  3.6 cm   Diastology                 Normals LVIDs:         3.20 cm  1.7 cm   LV e' lateral:   7.83 cm/s 6.42 cm/s LV PW:         1.00 cm  1.4 cm   LV E/e' lateral: 10.9      15.4 LV IVS:        0.90 cm  1.3 cm   LV e' medial:    6.85 cm/s 6.96 cm/s LVOT diam:     1.80 cm  2.0 cm   LV E/e'  medial:  12.5      6.96 LV SV:         47 ml    79 ml LV SV Index:   27.28    45 ml/m2 LVOT Area:     2.54 cm 3.14 cm2  LEFT ATRIUM         Index LA diam:    3.20 cm 1.91 cm/m  AORTIC VALVE             Normals LVOT Vmax:   119.00 cm/s LVOT Vmean:  76.900 cm/s 75 cm/s LVOT VTI:    0.215 m     25.3 cm  AORTA                 Normals Ao Root diam: 3.10 cm 31 mm MITRAL VALVE              Normals MV Area (PHT): 3.48 cm              SHUNTS MV PHT:        63.22 msec 55 ms      Systemic VTI:  0.22 m MV Decel Time: 218 msec   187 ms     Systemic Diam: 1.80 cm MV E velocity: 85.70 cm/s  103 cm/s MV A velocity: 122.00 cm/s 70.3 cm/s MV E/A ratio:  0.70        1.5  Oswaldo Milian MD Electronically signed by Oswaldo Milian MD Signature Date/Time: 02/08/2019/8:53:50 PMThe mitral valve is normal in structure.    Final    US Abdomen Limited RUQ  Result Date: 02/17/2019 CLINICAL DATA:  Elevated liver function tests. EXAM: ULTRASOUND ABDOMEN LIMITED RIGHT UPPER QUADRANT COMPARISON:  06/09/2017 CT FINDINGS: Gallbladder: Stones at up to 6 mm. No wall thickening or pericholecystic fluid. Sonographic Murphy's sign was not elicited. Common bile duct: Diameter: Normal, 6 mm. Liver: No focal lesion identified. Within normal limits in parenchymal echogenicity. Portal vein is patent on color Doppler imaging with normal direction of blood flow towards the liver. Other: Small volume perihepatic ascites. Increased right renal echogenicity. IMPRESSION: 1. Cholelithiasis without acute cholecystitis or biliary duct dilatation. 2. Small volume perihepatic ascites. 3. Hyperechoic right kidney, consistent with medical renal disease. Electronically Signed   By: Abigail Miyamoto M.D.   On: 02/17/2019 19:35    Microbiology: No results found for this or any previous visit (from the past 240 hour(s)).   Labs: Basic Metabolic Panel: Recent Labs  Lab 02/16/19 0639 02/17/19 0500 02/18/19 0619 02/19/19 0543 02/20/19 0459  NA 125*  126* 127* 128* 127*  K 4.9 5.0 4.8 5.1 5.1  CL 93* 92* 91* 93* 90*  CO2 16* 19* 20* 21* 20*  GLUCOSE 158* 107* 102* 120* 227*  BUN 112* 116* 117* 113* 109*  CREATININE 6.81* 6.68* 6.51* 5.73* 5.56*  CALCIUM 7.5* 8.0* 7.8* 7.8* 7.8*  MG 1.8 2.0 1.8  --   --   PHOS 6.0* 5.9* 6.0* 5.7* 4.8*   Liver Function Tests: Recent Labs  Lab 02/16/19 0639 02/17/19 0500 02/18/19 0619 02/19/19 0543 02/20/19 0459  AST 53* 53* 41  --   --   ALT 75* 98* 94*  --   --   ALKPHOS 84 99 85  --   --   BILITOT 0.9 0.9 0.7  --   --   PROT 5.1* 6.0* 5.2*  --   --   ALBUMIN 2.4* 3.1* 2.7* 2.6* 2.7*   No results for input(s): LIPASE, AMYLASE in the last 168 hours. No results for input(s): AMMONIA in the last 168 hours. CBC: Recent Labs  Lab 02/14/19 1629 02/14/19 1629 02/15/19 0915 02/15/19 0915 02/16/19 0639 02/17/19 0500 02/18/19 0619 02/19/19 0543 02/20/19 0459  WBC 15.2*   < > 17.1*   < > 16.4* 18.4* 16.2* 16.6* 16.6*  NEUTROABS 13.4*  --  14.4*  --  12.8* 14.5* 13.4*  --   --   HGB 10.9*   < > 10.4*   < > 9.2* 10.3* 9.4* 8.8* 9.1*  HCT 32.6*   < > 29.7*   < > 26.7* 30.6* 27.8* 25.9* 27.2*  MCV 76.2*   < > 74.6*   < > 75.0* 75.0* 75.1* 75.3* 77.1*  PLT 353   < > 355   < > 315 394 381 355 338   < > = values in this interval not displayed.   Cardiac Enzymes: No results for input(s): CKTOTAL, CKMB, CKMBINDEX, TROPONINI in the last 168 hours. BNP: BNP (last 3 results) Recent Labs    06/20/18 1723 06/21/18 1533  BNP 170.8* 93.4    ProBNP (last 3 results) No results for input(s): PROBNP in the last 8760 hours.  CBG: Recent Labs  Lab 02/19/19 1118 02/19/19 1714 02/19/19 2146 02/19/19 2246 02/20/19 0739  GLUCAP 284* 330* 300* 277* 176*       Signed:  Domenic Polite MD.  [facility] 02/20/2019, 11:31 AM

## 2019-02-20 NOTE — Care Management (Addendum)
CM confirms with with attending that discharge order should state home not SNF.  CM made first available appt with pts PCP Dr [name] per pts request - office informed visit needs to be telehealth secondary to covid - See AVS for appt. CM requested MD to fix discharge order.  NO CM needs determined - no TOC consults outstanding - CM signing off

## 2019-02-26 DIAGNOSIS — Z09 Encounter for follow-up examination after completed treatment for conditions other than malignant neoplasm: Secondary | ICD-10-CM | POA: Diagnosis not present

## 2019-02-26 DIAGNOSIS — Z8616 Personal history of COVID-19: Secondary | ICD-10-CM | POA: Diagnosis not present

## 2019-02-27 DIAGNOSIS — N184 Chronic kidney disease, stage 4 (severe): Secondary | ICD-10-CM | POA: Diagnosis not present

## 2019-03-03 DIAGNOSIS — I120 Hypertensive chronic kidney disease with stage 5 chronic kidney disease or end stage renal disease: Secondary | ICD-10-CM | POA: Diagnosis not present

## 2019-03-03 DIAGNOSIS — D649 Anemia, unspecified: Secondary | ICD-10-CM | POA: Diagnosis not present

## 2019-03-03 DIAGNOSIS — H548 Legal blindness, as defined in USA: Secondary | ICD-10-CM | POA: Diagnosis not present

## 2019-03-03 DIAGNOSIS — N2581 Secondary hyperparathyroidism of renal origin: Secondary | ICD-10-CM | POA: Diagnosis not present

## 2019-03-03 DIAGNOSIS — Z66 Do not resuscitate: Secondary | ICD-10-CM | POA: Diagnosis not present

## 2019-03-03 DIAGNOSIS — E1122 Type 2 diabetes mellitus with diabetic chronic kidney disease: Secondary | ICD-10-CM | POA: Diagnosis not present

## 2019-03-03 DIAGNOSIS — N185 Chronic kidney disease, stage 5: Secondary | ICD-10-CM | POA: Diagnosis not present

## 2019-03-05 ENCOUNTER — Other Ambulatory Visit: Payer: Self-pay | Admitting: Endocrinology

## 2019-03-05 DIAGNOSIS — Z794 Long term (current) use of insulin: Secondary | ICD-10-CM

## 2019-03-05 DIAGNOSIS — E1165 Type 2 diabetes mellitus with hyperglycemia: Secondary | ICD-10-CM

## 2019-03-11 ENCOUNTER — Other Ambulatory Visit: Payer: Self-pay | Admitting: Endocrinology

## 2019-03-11 DIAGNOSIS — N186 End stage renal disease: Secondary | ICD-10-CM | POA: Diagnosis not present

## 2019-03-11 DIAGNOSIS — Z992 Dependence on renal dialysis: Secondary | ICD-10-CM | POA: Diagnosis not present

## 2019-03-13 ENCOUNTER — Other Ambulatory Visit: Payer: Self-pay | Admitting: Endocrinology

## 2019-03-13 ENCOUNTER — Ambulatory Visit (INDEPENDENT_AMBULATORY_CARE_PROVIDER_SITE_OTHER): Payer: Medicare Other | Admitting: *Deleted

## 2019-03-13 DIAGNOSIS — R55 Syncope and collapse: Secondary | ICD-10-CM

## 2019-03-13 LAB — CUP PACEART REMOTE DEVICE CHECK
Date Time Interrogation Session: 20210212124455
Implantable Pulse Generator Implant Date: 20180523

## 2019-03-14 NOTE — Progress Notes (Signed)
ILR remote 

## 2019-03-15 ENCOUNTER — Other Ambulatory Visit: Payer: Self-pay | Admitting: Endocrinology

## 2019-03-18 DIAGNOSIS — E785 Hyperlipidemia, unspecified: Secondary | ICD-10-CM | POA: Insufficient documentation

## 2019-03-18 DIAGNOSIS — N189 Chronic kidney disease, unspecified: Secondary | ICD-10-CM | POA: Insufficient documentation

## 2019-03-18 DIAGNOSIS — R52 Pain, unspecified: Secondary | ICD-10-CM | POA: Insufficient documentation

## 2019-03-18 DIAGNOSIS — U071 COVID-19: Secondary | ICD-10-CM | POA: Insufficient documentation

## 2019-03-18 DIAGNOSIS — D631 Anemia in chronic kidney disease: Secondary | ICD-10-CM | POA: Insufficient documentation

## 2019-03-18 DIAGNOSIS — N186 End stage renal disease: Secondary | ICD-10-CM | POA: Insufficient documentation

## 2019-03-18 DIAGNOSIS — N185 Chronic kidney disease, stage 5: Secondary | ICD-10-CM | POA: Diagnosis not present

## 2019-03-18 DIAGNOSIS — R0602 Shortness of breath: Secondary | ICD-10-CM | POA: Insufficient documentation

## 2019-03-18 DIAGNOSIS — Z4901 Encounter for fitting and adjustment of extracorporeal dialysis catheter: Secondary | ICD-10-CM | POA: Insufficient documentation

## 2019-03-18 DIAGNOSIS — N2581 Secondary hyperparathyroidism of renal origin: Secondary | ICD-10-CM | POA: Insufficient documentation

## 2019-03-18 DIAGNOSIS — D689 Coagulation defect, unspecified: Secondary | ICD-10-CM | POA: Insufficient documentation

## 2019-03-18 DIAGNOSIS — L299 Pruritus, unspecified: Secondary | ICD-10-CM | POA: Insufficient documentation

## 2019-03-18 DIAGNOSIS — I5030 Unspecified diastolic (congestive) heart failure: Secondary | ICD-10-CM | POA: Insufficient documentation

## 2019-03-18 DIAGNOSIS — R197 Diarrhea, unspecified: Secondary | ICD-10-CM | POA: Insufficient documentation

## 2019-03-19 DIAGNOSIS — D509 Iron deficiency anemia, unspecified: Secondary | ICD-10-CM | POA: Diagnosis not present

## 2019-03-19 DIAGNOSIS — Z992 Dependence on renal dialysis: Secondary | ICD-10-CM | POA: Diagnosis not present

## 2019-03-19 DIAGNOSIS — N2581 Secondary hyperparathyroidism of renal origin: Secondary | ICD-10-CM | POA: Diagnosis not present

## 2019-03-19 DIAGNOSIS — N186 End stage renal disease: Secondary | ICD-10-CM | POA: Diagnosis not present

## 2019-03-19 DIAGNOSIS — Z23 Encounter for immunization: Secondary | ICD-10-CM | POA: Diagnosis not present

## 2019-03-19 DIAGNOSIS — D631 Anemia in chronic kidney disease: Secondary | ICD-10-CM | POA: Diagnosis not present

## 2019-03-19 DIAGNOSIS — E1129 Type 2 diabetes mellitus with other diabetic kidney complication: Secondary | ICD-10-CM | POA: Diagnosis not present

## 2019-03-21 DIAGNOSIS — D631 Anemia in chronic kidney disease: Secondary | ICD-10-CM | POA: Diagnosis not present

## 2019-03-21 DIAGNOSIS — D509 Iron deficiency anemia, unspecified: Secondary | ICD-10-CM | POA: Diagnosis not present

## 2019-03-21 DIAGNOSIS — Z992 Dependence on renal dialysis: Secondary | ICD-10-CM | POA: Diagnosis not present

## 2019-03-21 DIAGNOSIS — N2581 Secondary hyperparathyroidism of renal origin: Secondary | ICD-10-CM | POA: Diagnosis not present

## 2019-03-21 DIAGNOSIS — Z23 Encounter for immunization: Secondary | ICD-10-CM | POA: Diagnosis not present

## 2019-03-21 DIAGNOSIS — N186 End stage renal disease: Secondary | ICD-10-CM | POA: Diagnosis not present

## 2019-03-24 DIAGNOSIS — Z23 Encounter for immunization: Secondary | ICD-10-CM | POA: Diagnosis not present

## 2019-03-24 DIAGNOSIS — N186 End stage renal disease: Secondary | ICD-10-CM | POA: Diagnosis not present

## 2019-03-24 DIAGNOSIS — D509 Iron deficiency anemia, unspecified: Secondary | ICD-10-CM | POA: Diagnosis not present

## 2019-03-24 DIAGNOSIS — N2581 Secondary hyperparathyroidism of renal origin: Secondary | ICD-10-CM | POA: Diagnosis not present

## 2019-03-24 DIAGNOSIS — Z992 Dependence on renal dialysis: Secondary | ICD-10-CM | POA: Diagnosis not present

## 2019-03-24 DIAGNOSIS — D631 Anemia in chronic kidney disease: Secondary | ICD-10-CM | POA: Diagnosis not present

## 2019-03-26 DIAGNOSIS — N2581 Secondary hyperparathyroidism of renal origin: Secondary | ICD-10-CM | POA: Diagnosis not present

## 2019-03-26 DIAGNOSIS — N186 End stage renal disease: Secondary | ICD-10-CM | POA: Diagnosis not present

## 2019-03-26 DIAGNOSIS — Z23 Encounter for immunization: Secondary | ICD-10-CM | POA: Diagnosis not present

## 2019-03-26 DIAGNOSIS — Z992 Dependence on renal dialysis: Secondary | ICD-10-CM | POA: Diagnosis not present

## 2019-03-26 DIAGNOSIS — E1129 Type 2 diabetes mellitus with other diabetic kidney complication: Secondary | ICD-10-CM | POA: Diagnosis not present

## 2019-03-26 DIAGNOSIS — D631 Anemia in chronic kidney disease: Secondary | ICD-10-CM | POA: Diagnosis not present

## 2019-03-26 DIAGNOSIS — D509 Iron deficiency anemia, unspecified: Secondary | ICD-10-CM | POA: Diagnosis not present

## 2019-03-28 DIAGNOSIS — Z992 Dependence on renal dialysis: Secondary | ICD-10-CM | POA: Diagnosis not present

## 2019-03-28 DIAGNOSIS — Z23 Encounter for immunization: Secondary | ICD-10-CM | POA: Diagnosis not present

## 2019-03-28 DIAGNOSIS — N2581 Secondary hyperparathyroidism of renal origin: Secondary | ICD-10-CM | POA: Diagnosis not present

## 2019-03-28 DIAGNOSIS — D509 Iron deficiency anemia, unspecified: Secondary | ICD-10-CM | POA: Diagnosis not present

## 2019-03-28 DIAGNOSIS — N186 End stage renal disease: Secondary | ICD-10-CM | POA: Diagnosis not present

## 2019-03-28 DIAGNOSIS — D631 Anemia in chronic kidney disease: Secondary | ICD-10-CM | POA: Diagnosis not present

## 2019-03-30 DIAGNOSIS — Z992 Dependence on renal dialysis: Secondary | ICD-10-CM | POA: Diagnosis not present

## 2019-03-30 DIAGNOSIS — N186 End stage renal disease: Secondary | ICD-10-CM | POA: Diagnosis not present

## 2019-03-30 DIAGNOSIS — E1122 Type 2 diabetes mellitus with diabetic chronic kidney disease: Secondary | ICD-10-CM | POA: Diagnosis not present

## 2019-03-31 DIAGNOSIS — E1129 Type 2 diabetes mellitus with other diabetic kidney complication: Secondary | ICD-10-CM | POA: Diagnosis not present

## 2019-03-31 DIAGNOSIS — N186 End stage renal disease: Secondary | ICD-10-CM | POA: Diagnosis not present

## 2019-03-31 DIAGNOSIS — Z992 Dependence on renal dialysis: Secondary | ICD-10-CM | POA: Diagnosis not present

## 2019-03-31 DIAGNOSIS — N2581 Secondary hyperparathyroidism of renal origin: Secondary | ICD-10-CM | POA: Diagnosis not present

## 2019-03-31 DIAGNOSIS — D509 Iron deficiency anemia, unspecified: Secondary | ICD-10-CM | POA: Diagnosis not present

## 2019-03-31 DIAGNOSIS — D631 Anemia in chronic kidney disease: Secondary | ICD-10-CM | POA: Diagnosis not present

## 2019-04-01 ENCOUNTER — Other Ambulatory Visit: Payer: Self-pay

## 2019-04-01 ENCOUNTER — Ambulatory Visit (INDEPENDENT_AMBULATORY_CARE_PROVIDER_SITE_OTHER): Payer: Medicare Other | Admitting: Podiatry

## 2019-04-01 DIAGNOSIS — E1151 Type 2 diabetes mellitus with diabetic peripheral angiopathy without gangrene: Secondary | ICD-10-CM | POA: Diagnosis not present

## 2019-04-01 DIAGNOSIS — L84 Corns and callosities: Secondary | ICD-10-CM

## 2019-04-01 DIAGNOSIS — B351 Tinea unguium: Secondary | ICD-10-CM | POA: Diagnosis not present

## 2019-04-01 DIAGNOSIS — M79675 Pain in left toe(s): Secondary | ICD-10-CM | POA: Diagnosis not present

## 2019-04-01 DIAGNOSIS — M79674 Pain in right toe(s): Secondary | ICD-10-CM | POA: Diagnosis not present

## 2019-04-02 ENCOUNTER — Encounter: Payer: Self-pay | Admitting: Podiatry

## 2019-04-02 DIAGNOSIS — D631 Anemia in chronic kidney disease: Secondary | ICD-10-CM | POA: Diagnosis not present

## 2019-04-02 DIAGNOSIS — N186 End stage renal disease: Secondary | ICD-10-CM | POA: Diagnosis not present

## 2019-04-02 DIAGNOSIS — N2581 Secondary hyperparathyroidism of renal origin: Secondary | ICD-10-CM | POA: Diagnosis not present

## 2019-04-02 DIAGNOSIS — D509 Iron deficiency anemia, unspecified: Secondary | ICD-10-CM | POA: Diagnosis not present

## 2019-04-02 DIAGNOSIS — Z992 Dependence on renal dialysis: Secondary | ICD-10-CM | POA: Diagnosis not present

## 2019-04-02 DIAGNOSIS — E1129 Type 2 diabetes mellitus with other diabetic kidney complication: Secondary | ICD-10-CM | POA: Diagnosis not present

## 2019-04-02 NOTE — Progress Notes (Signed)
  Subjective:  Patient ID: Julie Brewer, female    DOB: [birth year],  MRN: [id number]  Chief Complaint  Patient presents with  . Nail Problem    pt is here for routine foot care.   69 y.o. female returns for the above complaint.  Patient is a type II diabetic whose A1c is 7.1.  Patient presents with thickened elongated mycotic toenails x10.  Patient states that she is not able to cut it herself.  She also has secondary complaints of mid 3 calluses bilaterally x2.  She states it causes pain to walk on them.  She has not tried any over-the-counter medication for these calluses.  She would like to know if this could be debrided down as well.  Objective:  There were no vitals filed for this visit. Podiatric Exam: Vascular: dorsalis pedis and posterior tibial pulses are palpable bilateral. Capillary return is immediate. Temperature gradient is WNL. Skin turgor WNL  Sensorium: Normal Semmes Weinstein monofilament test. Normal tactile sensation bilaterally. Nail Exam: Pt has thick disfigured discolored nails with subungual debris noted bilateral entire nail hallux through fifth toenails Ulcer Exam: There is no evidence of ulcer or pre-ulcerative changes or infection. Orthopedic Exam: Muscle tone and strength are WNL. No limitations in general ROM. No crepitus or effusions noted. HAV  B/L.  Hammer toes 2-5  B/L. Skin: No Porokeratosis. No infection or ulcers.  Submet 3 hyperkeratotic lesion/callus x2.  Pain on palpation.  Assessment & Plan:  Patient was evaluated and treated and all questions answered.  Bilateral submet 3 hyperkeratotic lesion/callus x2 -Using a chisel blade and a handle, hyperkeratotic lesions were aggressively debrided down to healthy striated tissue.  No complications noted.  No pinpoint bleeding noted.  Onychomycosis with pain  -Nails palliatively debrided as below. -Educated on self-care  Procedure: Nail Debridement Rationale: pain  Type of Debridement: manual,  sharp debridement. Instrumentation: Nail nipper, rotary burr. Number of Nails: 10  Procedures and Treatment: Consent by patient was obtained for treatment procedures. The patient understood the discussion of treatment and procedures well. All questions were answered thoroughly reviewed. Debridement of mycotic and hypertrophic toenails, 1 through 5 bilateral and clearing of subungual debris. No ulceration, no infection noted.  Return Visit-Office Procedure: Patient instructed to return to the office for a follow up visit 3 months for continued evaluation and treatment.  Boneta Lucks, DPM    No follow-ups on file.

## 2019-04-04 DIAGNOSIS — Z992 Dependence on renal dialysis: Secondary | ICD-10-CM | POA: Diagnosis not present

## 2019-04-04 DIAGNOSIS — N186 End stage renal disease: Secondary | ICD-10-CM | POA: Diagnosis not present

## 2019-04-04 DIAGNOSIS — D631 Anemia in chronic kidney disease: Secondary | ICD-10-CM | POA: Diagnosis not present

## 2019-04-04 DIAGNOSIS — D509 Iron deficiency anemia, unspecified: Secondary | ICD-10-CM | POA: Diagnosis not present

## 2019-04-04 DIAGNOSIS — E1129 Type 2 diabetes mellitus with other diabetic kidney complication: Secondary | ICD-10-CM | POA: Diagnosis not present

## 2019-04-04 DIAGNOSIS — N2581 Secondary hyperparathyroidism of renal origin: Secondary | ICD-10-CM | POA: Diagnosis not present

## 2019-04-05 ENCOUNTER — Other Ambulatory Visit: Payer: Self-pay | Admitting: Endocrinology

## 2019-04-07 DIAGNOSIS — D631 Anemia in chronic kidney disease: Secondary | ICD-10-CM | POA: Diagnosis not present

## 2019-04-07 DIAGNOSIS — N186 End stage renal disease: Secondary | ICD-10-CM | POA: Diagnosis not present

## 2019-04-07 DIAGNOSIS — D509 Iron deficiency anemia, unspecified: Secondary | ICD-10-CM | POA: Diagnosis not present

## 2019-04-07 DIAGNOSIS — Z992 Dependence on renal dialysis: Secondary | ICD-10-CM | POA: Diagnosis not present

## 2019-04-07 DIAGNOSIS — N2581 Secondary hyperparathyroidism of renal origin: Secondary | ICD-10-CM | POA: Diagnosis not present

## 2019-04-07 DIAGNOSIS — E1129 Type 2 diabetes mellitus with other diabetic kidney complication: Secondary | ICD-10-CM | POA: Diagnosis not present

## 2019-04-09 DIAGNOSIS — Z992 Dependence on renal dialysis: Secondary | ICD-10-CM | POA: Diagnosis not present

## 2019-04-09 DIAGNOSIS — E1129 Type 2 diabetes mellitus with other diabetic kidney complication: Secondary | ICD-10-CM | POA: Diagnosis not present

## 2019-04-09 DIAGNOSIS — N186 End stage renal disease: Secondary | ICD-10-CM | POA: Diagnosis not present

## 2019-04-09 DIAGNOSIS — D631 Anemia in chronic kidney disease: Secondary | ICD-10-CM | POA: Diagnosis not present

## 2019-04-09 DIAGNOSIS — N2581 Secondary hyperparathyroidism of renal origin: Secondary | ICD-10-CM | POA: Diagnosis not present

## 2019-04-09 DIAGNOSIS — D509 Iron deficiency anemia, unspecified: Secondary | ICD-10-CM | POA: Diagnosis not present

## 2019-04-11 DIAGNOSIS — N2581 Secondary hyperparathyroidism of renal origin: Secondary | ICD-10-CM | POA: Diagnosis not present

## 2019-04-11 DIAGNOSIS — D631 Anemia in chronic kidney disease: Secondary | ICD-10-CM | POA: Diagnosis not present

## 2019-04-11 DIAGNOSIS — Z992 Dependence on renal dialysis: Secondary | ICD-10-CM | POA: Diagnosis not present

## 2019-04-11 DIAGNOSIS — D509 Iron deficiency anemia, unspecified: Secondary | ICD-10-CM | POA: Diagnosis not present

## 2019-04-11 DIAGNOSIS — N186 End stage renal disease: Secondary | ICD-10-CM | POA: Diagnosis not present

## 2019-04-11 DIAGNOSIS — E1129 Type 2 diabetes mellitus with other diabetic kidney complication: Secondary | ICD-10-CM | POA: Diagnosis not present

## 2019-04-13 ENCOUNTER — Ambulatory Visit (INDEPENDENT_AMBULATORY_CARE_PROVIDER_SITE_OTHER): Payer: Medicare Other | Admitting: *Deleted

## 2019-04-13 DIAGNOSIS — R55 Syncope and collapse: Secondary | ICD-10-CM

## 2019-04-13 LAB — CUP PACEART REMOTE DEVICE CHECK
Date Time Interrogation Session: 20210315135305
Implantable Pulse Generator Implant Date: 20180523

## 2019-04-14 DIAGNOSIS — D631 Anemia in chronic kidney disease: Secondary | ICD-10-CM | POA: Diagnosis not present

## 2019-04-14 DIAGNOSIS — E1129 Type 2 diabetes mellitus with other diabetic kidney complication: Secondary | ICD-10-CM | POA: Diagnosis not present

## 2019-04-14 DIAGNOSIS — N186 End stage renal disease: Secondary | ICD-10-CM | POA: Diagnosis not present

## 2019-04-14 DIAGNOSIS — N2581 Secondary hyperparathyroidism of renal origin: Secondary | ICD-10-CM | POA: Diagnosis not present

## 2019-04-14 DIAGNOSIS — Z992 Dependence on renal dialysis: Secondary | ICD-10-CM | POA: Diagnosis not present

## 2019-04-14 DIAGNOSIS — D509 Iron deficiency anemia, unspecified: Secondary | ICD-10-CM | POA: Diagnosis not present

## 2019-04-14 NOTE — Progress Notes (Signed)
ILR Remote 

## 2019-04-16 DIAGNOSIS — D631 Anemia in chronic kidney disease: Secondary | ICD-10-CM | POA: Diagnosis not present

## 2019-04-16 DIAGNOSIS — N2581 Secondary hyperparathyroidism of renal origin: Secondary | ICD-10-CM | POA: Diagnosis not present

## 2019-04-16 DIAGNOSIS — D509 Iron deficiency anemia, unspecified: Secondary | ICD-10-CM | POA: Diagnosis not present

## 2019-04-16 DIAGNOSIS — N186 End stage renal disease: Secondary | ICD-10-CM | POA: Diagnosis not present

## 2019-04-16 DIAGNOSIS — E1129 Type 2 diabetes mellitus with other diabetic kidney complication: Secondary | ICD-10-CM | POA: Diagnosis not present

## 2019-04-16 DIAGNOSIS — Z992 Dependence on renal dialysis: Secondary | ICD-10-CM | POA: Diagnosis not present

## 2019-04-17 ENCOUNTER — Telehealth (HOSPITAL_COMMUNITY): Payer: Self-pay

## 2019-04-17 NOTE — Telephone Encounter (Signed)

## 2019-04-18 DIAGNOSIS — D631 Anemia in chronic kidney disease: Secondary | ICD-10-CM | POA: Diagnosis not present

## 2019-04-18 DIAGNOSIS — N2581 Secondary hyperparathyroidism of renal origin: Secondary | ICD-10-CM | POA: Diagnosis not present

## 2019-04-18 DIAGNOSIS — N186 End stage renal disease: Secondary | ICD-10-CM | POA: Diagnosis not present

## 2019-04-18 DIAGNOSIS — Z992 Dependence on renal dialysis: Secondary | ICD-10-CM | POA: Diagnosis not present

## 2019-04-18 DIAGNOSIS — E1129 Type 2 diabetes mellitus with other diabetic kidney complication: Secondary | ICD-10-CM | POA: Diagnosis not present

## 2019-04-18 DIAGNOSIS — D509 Iron deficiency anemia, unspecified: Secondary | ICD-10-CM | POA: Diagnosis not present

## 2019-04-20 ENCOUNTER — Ambulatory Visit (HOSPITAL_COMMUNITY)
Admission: RE | Admit: 2019-04-20 | Discharge: 2019-04-20 | Disposition: A | Discharge status: Home / Self Care | Payer: Medicare Other | Source: Ambulatory Visit | Attending: Surgery | Admitting: Surgery

## 2019-04-20 ENCOUNTER — Other Ambulatory Visit: Payer: Self-pay | Admitting: *Deleted

## 2019-04-20 ENCOUNTER — Ambulatory Visit (INDEPENDENT_AMBULATORY_CARE_PROVIDER_SITE_OTHER): Payer: Medicare Other | Admitting: Surgery

## 2019-04-20 ENCOUNTER — Other Ambulatory Visit: Payer: Self-pay

## 2019-04-20 ENCOUNTER — Ambulatory Visit (INDEPENDENT_AMBULATORY_CARE_PROVIDER_SITE_OTHER)
Admission: RE | Admit: 2019-04-20 | Discharge: 2019-04-20 | Disposition: A | Discharge status: Home / Self Care | Payer: Medicare Other | Source: Ambulatory Visit | Attending: Surgery | Admitting: Surgery

## 2019-04-20 ENCOUNTER — Encounter: Payer: Self-pay | Admitting: Surgery

## 2019-04-20 VITALS — BP 175/86 | HR 90 | Temp 97.3°F | Resp 20 | Ht 63.0 in | Wt 163.0 lb

## 2019-04-20 DIAGNOSIS — N186 End stage renal disease: Secondary | ICD-10-CM

## 2019-04-20 DIAGNOSIS — I70212 Atherosclerosis of native arteries of extremities with intermittent claudication, left leg: Secondary | ICD-10-CM | POA: Diagnosis not present

## 2019-04-20 DIAGNOSIS — Z992 Dependence on renal dialysis: Secondary | ICD-10-CM | POA: Diagnosis not present

## 2019-04-20 NOTE — Progress Notes (Signed)
[facility]  Patient name: Julie Brewer MRN: [id number] DOB: 1950/04/11 Sex: female   REASON FOR VISIT:    Follow up  HISOTRY OF PRESENT ILLNESS:    Julie Brewer is a 69 y.o. female who I initially saw in 2011 for claudication.  At that time she was having symptoms approximately 1 block.  She was started on cilostazol and had significant improvement  She is here today to discuss dialysis access.  She is on Tuesday Thursday Saturday dialysis via a catheter.  She is right-handed.  Patient is medically managed for hypertension.  She takes a statin for hypercholesterolemia.  She is a diabetic and former smoker.  She has a history of DVT in the 1970s, thought to be secondary to birth control.  She is blind.   PAST MEDICAL HISTORY:   Past Medical History:  Diagnosis Date  . Asthma    No probnlems recently  . 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 Diabetes mellitus    Type II per Dr [name]  (patient said type I)  . Fibroid   . GERD (gastroesophageal reflux disease)   . Glaucoma   . Hyperlipidemia   . Hypertension   . Iron deficiency anemia 03/09/2016  . Peripheral vascular disease ([place])   . Pneumonia 2006  . Shortness of breath dyspnea    with exdrtion, "Walkling too fast"  . Stroke [facility])    no residual     FAMILY HISTORY:   Family History  Problem Relation Age of Onset  . Cancer Mother   . Heart disease Mother   . Diabetes Father   . Cancer Brother   . Cancer Brother   . Throat cancer Brother     SOCIAL HISTORY:   Social History   Tobacco Use  . Smoking status: Current Every Day Smoker    Packs/day: 1.00    Years: 30.00    Pack years: 30.00    Types: Cigarettes  . Smokeless tobacco: Never Used  Substance Use Topics  . Alcohol use: Yes    Comment: socially     ALLERGIES:   Allergies  Allergen Reactions  . Morphine And  Related Other (See Comments)    Hallucinations   . Penicillins Swelling and Rash    Throat swelling Did it involve swelling of the face/tongue/throat, SOB, or low BP? Yes Did it involve sudden or severe rash/hives, skin peeling, or any reaction on the inside of your mouth or nose? Yes Did you need to seek medical attention at a hospital or doctor's office? Yes When did it last happen?young child If all above answers are "NO", may proceed with cephalosporin use.     CURRENT MEDICATIONS:   Current Outpatient Medications  Medication Sig Dispense Refill  . acetaminophen (TYLENOL) 325 MG tablet Take 2 tablets (650 mg total) by mouth every 6 (six) hours as needed for mild pain (or Fever >/= 101).    Marland Kitchen amLODipine (NORVASC) 5 MG tablet Take 5 mg by mouth daily.    Marland Kitchen aspirin 325 MG EC tablet Take 325 mg by mouth daily.     Marland Kitchen atorvastatin (LIPITOR) 20 MG tablet Take 1 tablet by mouth once daily 90 tablet 0  . bisacodyl (DULCOLAX) 5 MG EC tablet Take 5 mg by mouth daily as needed for moderate constipation.    . cilostazol (PLETAL) 100 MG tablet Take 1 tablet by mouth twice  daily (Patient taking differently: Take 100 mg by mouth 2 (two) times daily. ) 60 tablet 5  . docusate sodium (COLACE) 100 MG capsule Take 200 mg by mouth daily.     . ferrous sulfate 325 (65 FE) MG tablet Take 650 mg by mouth daily.    . furosemide (LASIX) 80 MG tablet Take 1 tablet (80 mg total) by mouth 2 (two) times daily. 60 tablet 0  . gabapentin (NEURONTIN) 100 MG capsule Take 1 capsule twice daily as needed.  Make appointment for full refill 60 capsule 0  . glucose blood (FREESTYLE LITE) test strip USE AS INSTRUCTED TO CHECK BLOOD SUGAR ONCE DAILY. 50 each 0  . insulin glargine (LANTUS) 100 UNIT/ML injection Inject 0.08 mLs (8 Units total) into the skin daily. 10 mL 11  . insulin regular (NOVOLIN R RELION) 100 units/mL injection Inject 0.03 mLs (3 Units total) into the skin 3 (three) times daily before meals. Inject  12 units subcutaneously every morning and 5-7 units in the evening (5 units for regular meal, 7 units if eating potatoes)    . omeprazole (PRILOSEC) 20 MG capsule TAKE 1 CAPSULE BY MOUTH ONCE DAILY ( DUE FOR OFFICE VISIT THIS MONTH) 90 capsule 0  . ondansetron (ZOFRAN) 4 MG tablet SMARTSIG:1 Tablet(s) By Mouth As Needed    . pantoprazole (PROTONIX) 40 MG tablet Take 1 tablet (40 mg total) by mouth daily for 30 days. 30 tablet 0  . Vitamin D, Ergocalciferol, (DRISDOL) 1.25 MG (50000 UNIT) CAPS capsule Take 1 capsule by mouth once a week 12 capsule 0   No current facility-administered medications for this visit.    REVIEW OF SYSTEMS:   [X]  denotes positive finding, [ ]  denotes negative finding Cardiac  Comments:  Chest pain or chest pressure:    Shortness of breath upon exertion:    Short of breath when lying flat:    Irregular heart rhythm:        Vascular    Pain in calf, thigh, or hip brought on by ambulation:    Pain in feet at night that wakes you up from your sleep:     Blood clot in your veins:    Leg swelling:         Pulmonary    Oxygen at home:    Productive cough:     Wheezing:         Neurologic    Sudden weakness in arms or legs:     Sudden numbness in arms or legs:     Sudden onset of difficulty speaking or slurred speech:    Temporary loss of vision in one eye:     Problems with dizziness:         Gastrointestinal    Blood in stool:     Vomited blood:         Genitourinary    Burning when urinating:     Blood in urine:        Psychiatric    Major depression:         Hematologic    Bleeding problems:    Problems with blood clotting too easily:        Skin    Rashes or ulcers:        Constitutional    Fever or chills:      PHYSICAL EXAM:   There were no vitals filed for this visit.  GENERAL: The patient is a well-nourished female, in no acute distress. The vital signs  are documented above. CARDIAC: There is a regular rate and rhythm.    VASCULAR: Palpable radial pulses bilaterally PULMONARY: Non-labored respirations MUSCULOSKELETAL: There are no major deformities or cyanosis. NEUROLOGIC: No focal weakness or paresthesias are detected.  Blind SKIN: There are no ulcers or rashes noted. PSYCHIATRIC: The patient has a normal affect.  STUDIES:   I have reviewed the following ultrasound studies: Arterial: Right: Patent brachial, radial, and ulnar arteries.  Left: Patent brachial, radial, and ulnar arteries.   Right Cephalic  Diameter (cm)Depth (cm)      Findings         +-----------------+-------------+----------+------------------------------+   Shoulder       0.08                           +-----------------+-------------+----------+------------------------------+   Prox upper arm    0.13                           +-----------------+-------------+----------+------------------------------+   Mid upper arm    0.06            Chronic Thrombus       +-----------------+-------------+----------+------------------------------+   Dist upper arm    0.07            Chronic Thrombus       +-----------------+-------------+----------+------------------------------+   Antecubital fossa  0.11        branching and Chronic  Thrombus  +-----------------+-------------+----------+------------------------------+    +-----------------+-------------+----------+---------+  Right Basilic  Diameter (cm)Depth (cm)Findings   +-----------------+-------------+----------+---------+  Prox upper arm    0.15               +-----------------+-------------+----------+---------+  Mid upper arm    0.15               +-----------------+-------------+----------+---------+  Dist upper arm    0.23                +-----------------+-------------+----------+---------+  Antecubital fossa  0.17        branching  +-----------------+-------------+----------+---------+   +-----------------+-------------+----------+------------------------------+   Left Cephalic  Diameter (cm)Depth (cm)      Findings         +-----------------+-------------+----------+------------------------------+   Shoulder       0.10                           +-----------------+-------------+----------+------------------------------+   Prox upper arm    0.12                           +-----------------+-------------+----------+------------------------------+   Mid upper arm    0.08                           +-----------------+-------------+----------+------------------------------+   Dist upper arm    0.11                           +-----------------+-------------+----------+------------------------------+   Antecubital fossa  0.14        Chronic Thrombus and  branching  +-----------------+-------------+----------+------------------------------+   Prox forearm     0.07             branching         +-----------------+-------------+----------+------------------------------+    +-----------------+-------------+----------+---------+  Left Basilic   Diameter (cm)Depth (cm)Findings   +-----------------+-------------+----------+---------+  Prox upper arm    0.13               +-----------------+-------------+----------+---------+  Mid upper arm    0.19        branching  +-----------------+-------------+----------+---------+  Dist upper arm    0.14        branching  +-----------------+-------------+----------+---------+  Antecubital fossa  0.11                +-----------------+-------------+----------+---------+   MEDICAL ISSUES:   End-stage renal disease: Vein mapping shows small surface veins.  I have recommended placing a graft.  This will most likely need to be an upper arm graft, which is what the patient prefers.  I discussed the details of the procedure including the risk of steal, graft failure, and the need for future interventions.  All of her questions were answered.  She would like to get this done as soon as possible.  Therefore this will need to be scheduled with one of my partners next week on a nondialysis day.  I Minna hold her cilostazol for 5 days prior to her surgery.  All of her questions were answered.  She is here today with her daughter.    Leia Alf, MD, FACS [facility] [phone] Pager [phone]

## 2019-04-21 DIAGNOSIS — D631 Anemia in chronic kidney disease: Secondary | ICD-10-CM | POA: Diagnosis not present

## 2019-04-21 DIAGNOSIS — D509 Iron deficiency anemia, unspecified: Secondary | ICD-10-CM | POA: Diagnosis not present

## 2019-04-21 DIAGNOSIS — N2581 Secondary hyperparathyroidism of renal origin: Secondary | ICD-10-CM | POA: Diagnosis not present

## 2019-04-21 DIAGNOSIS — Z992 Dependence on renal dialysis: Secondary | ICD-10-CM | POA: Diagnosis not present

## 2019-04-21 DIAGNOSIS — E1129 Type 2 diabetes mellitus with other diabetic kidney complication: Secondary | ICD-10-CM | POA: Diagnosis not present

## 2019-04-21 DIAGNOSIS — N186 End stage renal disease: Secondary | ICD-10-CM | POA: Diagnosis not present

## 2019-04-22 ENCOUNTER — Other Ambulatory Visit: Payer: Self-pay

## 2019-04-23 DIAGNOSIS — Z992 Dependence on renal dialysis: Secondary | ICD-10-CM | POA: Diagnosis not present

## 2019-04-23 DIAGNOSIS — N2581 Secondary hyperparathyroidism of renal origin: Secondary | ICD-10-CM | POA: Diagnosis not present

## 2019-04-23 DIAGNOSIS — D631 Anemia in chronic kidney disease: Secondary | ICD-10-CM | POA: Diagnosis not present

## 2019-04-23 DIAGNOSIS — E1129 Type 2 diabetes mellitus with other diabetic kidney complication: Secondary | ICD-10-CM | POA: Diagnosis not present

## 2019-04-23 DIAGNOSIS — D509 Iron deficiency anemia, unspecified: Secondary | ICD-10-CM | POA: Diagnosis not present

## 2019-04-23 DIAGNOSIS — N186 End stage renal disease: Secondary | ICD-10-CM | POA: Diagnosis not present

## 2019-04-24 ENCOUNTER — Encounter: Payer: Self-pay | Admitting: Endocrinology

## 2019-04-24 ENCOUNTER — Inpatient Hospital Stay (HOSPITAL_COMMUNITY)
Admission: RE | Admit: 2019-04-24 | Discharge: 2019-04-24 | Disposition: A | Discharge status: Home / Self Care | Payer: Medicare Other | Source: Ambulatory Visit

## 2019-04-24 ENCOUNTER — Other Ambulatory Visit: Payer: Self-pay

## 2019-04-24 ENCOUNTER — Ambulatory Visit (INDEPENDENT_AMBULATORY_CARE_PROVIDER_SITE_OTHER): Payer: Medicare Other | Admitting: Endocrinology

## 2019-04-24 ENCOUNTER — Encounter (HOSPITAL_COMMUNITY): Payer: Self-pay | Admitting: Vascular Surgery

## 2019-04-24 VITALS — BP 164/64 | HR 78 | Ht 63.0 in | Wt 139.2 lb

## 2019-04-24 DIAGNOSIS — E1165 Type 2 diabetes mellitus with hyperglycemia: Secondary | ICD-10-CM

## 2019-04-24 DIAGNOSIS — I70212 Atherosclerosis of native arteries of extremities with intermittent claudication, left leg: Secondary | ICD-10-CM

## 2019-04-24 DIAGNOSIS — E782 Mixed hyperlipidemia: Secondary | ICD-10-CM

## 2019-04-24 DIAGNOSIS — Z794 Long term (current) use of insulin: Secondary | ICD-10-CM

## 2019-04-24 LAB — LIPID PANEL
Cholesterol: 137 mg/dL (ref 0–200)
HDL: 44 mg/dL (ref >39.00)
LDL Cholesterol: 70 mg/dL (ref 0–99)
NonHDL: 93.23
Total CHOL/HDL Ratio: 3
Triglycerides: 115 mg/dL (ref 0.0–149.0)
VLDL: 23 mg/dL (ref 0.0–40.0)

## 2019-04-24 LAB — ALT: ALT: 14 U/L (ref 0–35)

## 2019-04-24 NOTE — H&P (View-Only) (Signed)
Patient ID: Julie Brewer, female   DOB: 1950/10/23, 69 y.o.   MRN: [id number]   Reason for Appointment: Follow-up of various problems, including hypertension  History of Present Illness    Diagnosis: Type 2 DIABETES MELITUS, date of diagnosis:  1985  Prior history: She has been on insulin since diagnosis and on Lantus previously Also at some point had been started on Glucophage several years ago also Because of insurance preference Lantus was changed to Levemir  She refuses to use analog rapid acting insulin because of cost and is using regular insulin for several years   Her blood sugars are generally well controlled and A1c usually under 7% Her A1c previously was higher with stopping metformin at 8%  Recent history:   Insulin regimen: Lantus insulin 14U in the morning daily.  Regular insulin 30 minutes Before eating, 10 units a.m. and 5-7 ac supper  Oral hypoglycemic drugs: None   She has not been seen in follow-up since 10/2018  Current blood sugar patterns, management and problems:  Her A1c is reportedly 6.5 at the dialysis center but no report available On her last visit it was 7.6   She is not checking her sugar much at all and has only a very few readings; highest reading in the morning was 190 but not sure if her niece has been using her meter also for herself  Although today her blood sugar was 156 when she woke up it was 170 in the office  She was previously told to take 12 units of regular insulin for breakfast when she is eating cereal but since she is eating oatmeal now she is still taking only 10 units  She did not have any readings after meals  She thinks her diet has been fairly good but she appears to be eating a larger breakfast especially since she is going for dialysis later in the morning  Trying to take her regular insulin up to 30 minutes before eating  She does take her Lantus in the morning before breakfast and also  Unclear if  she has gained or lost any weight as it appears to be fluctuating significantly, previously on her visit was 147  Side effects from medications: None      Glucometer:  FreeStyle   PRE-MEAL Fasting Lunch Dinner Bedtime Overall  Glucose range: 90-190      Mean/median:          Previous FASTING range 70-190, AVERAGE 101   Meals:  usually 2 meals per day at 10 AM and 5 PM.     Mealtime protein sources:turkey, chicken.  Eating cereal for breakfast daily Avoiding sweet drinks  Physical activity: exercise: Some walking within the house              Wt Readings from Last 3 Encounters:  04/24/19 139 lb 3.2 oz (63.1 kg)  04/20/19 163 lb (73.9 kg)  02/20/19 163 lb 2.3 oz (74 kg)   Lab Results  Component Value Date   HGBA1C 7.1 (H) 02/07/2019   HGBA1C 7.6 (H) 11/04/2018   HGBA1C 6.7 (A) 06/03/2018   Lab Results  Component Value Date   MICROALBUR 196.7 (H) 03/11/2018   LDLCALC 67 07/05/2017   CREATININE 5.56 (H) 02/20/2019      Lab Results  Component Value Date   FRUCTOSAMINE 218 03/11/2018   FRUCTOSAMINE 229 09/13/2017    Other active problems: See review of systems      Allergies as of 04/24/2019  Reactions   Morphine And Related Other (See Comments)   Hallucinations    Penicillins Swelling, Rash   Throat swelling Did it involve swelling of the face/tongue/throat, SOB, or low BP? Yes Did it involve sudden or severe rash/hives, skin peeling, or any reaction on the inside of your mouth or nose? Yes Did you need to seek medical attention at a hospital or doctor's office? Yes When did it last happen?young child If all above answers are "NO", may proceed with cephalosporin use.      Medication List       Accurate as of April 24, 2019  9:25 AM. If you have any questions, ask your nurse or doctor.        STOP taking these medications   furosemide 80 MG tablet Commonly known as: LASIX Stopped by: Elayne Snare, MD   ondansetron 4 MG tablet Commonly  known as: ZOFRAN Stopped by: Elayne Snare, MD   pantoprazole 40 MG tablet Commonly known as: PROTONIX Stopped by: Elayne Snare, MD     TAKE these medications   acetaminophen 325 MG tablet Commonly known as: TYLENOL Take 2 tablets (650 mg total) by mouth every 6 (six) hours as needed for mild pain (or Fever >/= 101).   amLODipine 5 MG tablet Commonly known as: NORVASC Take 5 mg by mouth daily.   aspirin 325 MG EC tablet Take 325 mg by mouth daily.   atorvastatin 20 MG tablet Commonly known as: LIPITOR Take 1 tablet by mouth once daily   bisacodyl 5 MG EC tablet Commonly known as: DULCOLAX Take 5 mg by mouth daily as needed for moderate constipation.   cilostazol 100 MG tablet Commonly known as: PLETAL Take 1 tablet by mouth twice daily   docusate sodium 100 MG capsule Commonly known as: COLACE Take 200 mg by mouth daily.   ferrous sulfate 325 (65 FE) MG tablet Take 650 mg by mouth daily.   FREESTYLE LITE test strip Generic drug: glucose blood USE AS INSTRUCTED TO CHECK BLOOD SUGAR ONCE DAILY.   gabapentin 100 MG capsule Commonly known as: NEURONTIN Take 1 capsule twice daily as needed.  Make appointment for full refill   Lantus 100 UNIT/ML injection Generic drug: insulin glargine Inject 14 Units into the skin daily. Inject 14 units under the skin once daily. What changed: Another medication with the same name was removed. Continue taking this medication, and follow the directions you see here. Changed by: Elayne Snare, MD   NovoLIN R 100 units/mL injection Generic drug: insulin regular Inject into the skin 2 (two) times daily before a meal. Inject 10 units under the skin before breakfast and 5 units before dinner. What changed: Another medication with the same name was removed. Continue taking this medication, and follow the directions you see here. Changed by: Elayne Snare, MD   omeprazole 20 MG capsule Commonly known as: PRILOSEC TAKE 1 CAPSULE BY MOUTH ONCE  DAILY ( DUE FOR OFFICE VISIT THIS MONTH)   Vitamin D (Ergocalciferol) 1.25 MG (50000 UNIT) Caps capsule Commonly known as: DRISDOL Take 1 capsule by mouth once a week       Allergies:  Allergies  Allergen Reactions  . Morphine And Related Other (See Comments)    Hallucinations   . Penicillins Swelling and Rash    Throat swelling Did it involve swelling of the face/tongue/throat, SOB, or low BP? Yes Did it involve sudden or severe rash/hives, skin peeling, or any reaction on the inside of your mouth or nose?  Yes Did you need to seek medical attention at a hospital or doctor's office? Yes When did it last happen?young child If all above answers are "NO", may proceed with cephalosporin use.    Past Medical History:  Diagnosis Date  . Asthma    No probnlems recently  . 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 Chronic kidney disease   . Diabetes mellitus    Type II per Dr [name]  (patient said type I)  . Fibroid   . GERD (gastroesophageal reflux disease)   . Glaucoma   . Hyperlipidemia   . Hypertension   . Iron deficiency anemia 03/09/2016  . Peripheral vascular disease ([place])   . Pneumonia 2006  . Shortness of breath dyspnea    with exdrtion, "Walkling too fast"  . Stroke [facility])    no residual    Past Surgical History:  Procedure Laterality Date  . ABDOMINAL HYSTERECTOMY    . BIOPSY  06/10/2017   Procedure: BIOPSY;  Surgeon: Ronnette Juniper, MD;  Location: [place];  Service: Gastroenterology;;  . CERVICAL FUSION     with graft from hip  . COLONOSCOPY  July 09, 2012  . DIRECT LARYNGOSCOPY N/A 06/07/2014   Procedure: DIRECT LARYNGOSCOPY with BIOPSY and EXCISION VOLLECULAR CYST;  Surgeon: Ruby Cola, MD;  Location: [facility];  Service: ENT;  Laterality: N/A;  . ESOPHAGOGASTRODUODENOSCOPY (EGD) WITH PROPOFOL Left 06/10/2017   Procedure: ESOPHAGOGASTRODUODENOSCOPY (EGD) WITH PROPOFOL;  Surgeon: Ronnette Juniper, MD;  Location: [place];  Service: Gastroenterology;  Laterality: Left;  . FLEXIBLE SIGMOIDOSCOPY Left 06/10/2017   Procedure: FLEXIBLE SIGMOIDOSCOPY;  Surgeon: Ronnette Juniper, MD;  Location: [place];  Service: Gastroenterology;  Laterality: Left;  . LOOP RECORDER INSERTION N/A 06/20/2016   Procedure: Loop Recorder Insertion;  Surgeon: [name], MD;  Location: [place] CV LAB;  Service: Cardiovascular;  Laterality: N/A;  . REFRACTIVE SURGERY Bilateral    both eyes  . SPINE SURGERY     lumbar    Family History  Problem Relation Age of Onset  . Cancer Mother   . Heart disease Mother   . Diabetes Father   . Cancer Brother   . Cancer Brother   . Throat cancer Brother     Social History:  reports that she has been smoking cigarettes. She has a 30.00 pack-year smoking history. She has never used smokeless tobacco. She reports current alcohol use. She reports previous drug use. Drug: Methylphenidate.  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     HYPERLIPIDEMIA: The lipid abnormality consists of elevated LDL controlled with Lipitor 20 mg .   Her baseline LDL was 202  No recent lab results available  Lab Results  Component Value Date   CHOL 150 07/05/2017   HDL 52.90 07/05/2017   LDLCALC 67 07/05/2017   TRIG 155.0 (H) 07/05/2017   CHOLHDL 3 07/05/2017    Vitamin D: Taking 50,000 unit dosage once a month This is monitored by PCP   Lab Results  Component Value Date   VD25OH 40.22 09/13/2016       Last diabetic foot exam  was done in 7/20  Takes Gabapentin 100mg  twice daily for lower leg leg pains Has taken this long-term     Examination:   BP (!)  164/64 (BP Location: Left Arm, Patient Position: Sitting, Cuff Size: Normal)   Pulse 78   Ht 5\' 3"  (1.6 m)   Wt 139 lb 3.2 oz (63.1 kg)   SpO2 96%   BMI 24.66 kg/m   Body mass index is 24.66 kg/m.     ASSESSMENT/ PLAN:     Diabetes type 2 on insulin See history of present illness for detailed discussion of current diabetes management, blood sugar patterns and problems identified  Her A1c is reportedly 6.5, previously 7.6 and was 7.1 earlier this year  A1c may not be completely reliable because of her anemia and renal failure  She has minimal glucose monitoring at home and only a few readings in the mornings which are variable Also her meter is being used by another family member sometimes No hypoglycemia reported but she also has history of hypoglycemia unawareness Her diet appears to be fairly good with small portions and eating less cereal at breakfast now  Since no consistent pattern of high fasting blood sugars are identified will not change her basal insulin as yet Also discussed importance of checking her sugars after dinner and sometimes after breakfast to help adjust her regular insulin May also benefit from taking fructosamine to correlate with her A1c on the next visit   HYPERLIPIDEMIA: Has been on 20 mg Lipitor and needs follow-up labs and ALT level  Hypertension followed by nephrologist now   Patient Instructions  Check blood sugars on waking up days a week  Also check blood sugars about 2 hours after meals and do this after different meals by rotation  Recommended blood sugar levels on waking up are 90-130 and about 2 hours after meal is 130-180  Please bring your blood sugar monitor to each visit, thank you     Elayne Snare 04/24/2019, 9:25 AM     Note: This office note was prepared with Dragon voice recognition system technology. Any transcriptional errors that result from this process are unintentional.

## 2019-04-24 NOTE — Patient Instructions (Addendum)
Check blood sugars on waking up days a week  Also check blood sugars about 2 hours after meals and do this after different meals by rotation  Recommended blood sugar levels on waking up are 90-130 and about 2 hours after meal is 130-180  Please bring your blood sugar monitor to each visit, thank you  Lantus 15 units and if am sugar > 150 go up to 16 units

## 2019-04-24 NOTE — Progress Notes (Signed)
Patient ID: Julie Brewer, female   DOB: 1950/11/06, 69 y.o.   MRN: [id number]   Reason for Appointment: Follow-up of various problems, including hypertension  History of Present Illness    Diagnosis: Type 2 DIABETES MELITUS, date of diagnosis:  1985  Prior history: She has been on insulin since diagnosis and on Lantus previously Also at some point had been started on Glucophage several years ago also Because of insurance preference Lantus was changed to Levemir  She refuses to use analog rapid acting insulin because of cost and is using regular insulin for several years   Her blood sugars are generally well controlled and A1c usually under 7% Her A1c previously was higher with stopping metformin at 8%  Recent history:   Insulin regimen: Lantus insulin 14U in the morning daily.  Regular insulin 30 minutes Before eating, 10 units a.m. and 5-7 ac supper  Oral hypoglycemic drugs: None   She has not been seen in follow-up since 10/2018  Current blood sugar patterns, management and problems:  Her A1c is reportedly 6.5 at the dialysis center but no report available On her last visit it was 7.6   She is not checking her sugar much at all and has only a very few readings; highest reading in the morning was 190 but not sure if her niece has been using her meter also for herself  Although today her blood sugar was 156 when she woke up it was 170 in the office  She was previously told to take 12 units of regular insulin for breakfast when she is eating cereal but since she is eating oatmeal now she is still taking only 10 units  She did not have any readings after meals  She thinks her diet has been fairly good but she appears to be eating a larger breakfast especially since she is going for dialysis later in the morning  Trying to take her regular insulin up to 30 minutes before eating  She does take her Lantus in the morning before breakfast and also  Unclear if  she has gained or lost any weight as it appears to be fluctuating significantly, previously on her visit was 147  Side effects from medications: None      Glucometer:  FreeStyle   PRE-MEAL Fasting Lunch Dinner Bedtime Overall  Glucose range: 90-190      Mean/median:          Previous FASTING range 70-190, AVERAGE 101   Meals:  usually 2 meals per day at 10 AM and 5 PM.     Mealtime protein sources:turkey, chicken.  Eating cereal for breakfast daily Avoiding sweet drinks  Physical activity: exercise: Some walking within the house              Wt Readings from Last 3 Encounters:  04/24/19 139 lb 3.2 oz (63.1 kg)  04/20/19 163 lb (73.9 kg)  02/20/19 163 lb 2.3 oz (74 kg)   Lab Results  Component Value Date   HGBA1C 7.1 (H) 02/07/2019   HGBA1C 7.6 (H) 11/04/2018   HGBA1C 6.7 (A) 06/03/2018   Lab Results  Component Value Date   MICROALBUR 196.7 (H) 03/11/2018   LDLCALC 67 07/05/2017   CREATININE 5.56 (H) 02/20/2019      Lab Results  Component Value Date   FRUCTOSAMINE 218 03/11/2018   FRUCTOSAMINE 229 09/13/2017    Other active problems: See review of systems      Allergies as of 04/24/2019  Reactions   Morphine And Related Other (See Comments)   Hallucinations    Penicillins Swelling, Rash   Throat swelling Did it involve swelling of the face/tongue/throat, SOB, or low BP? Yes Did it involve sudden or severe rash/hives, skin peeling, or any reaction on the inside of your mouth or nose? Yes Did you need to seek medical attention at a hospital or doctor's office? Yes When did it last happen?young child If all above answers are NO, may proceed with cephalosporin use.      Medication List       Accurate as of April 24, 2019  9:25 AM. If you have any questions, ask your nurse or doctor.        STOP taking these medications   furosemide 80 MG tablet Commonly known as: LASIX Stopped by: Elayne Snare, MD   ondansetron 4 MG tablet Commonly  known as: ZOFRAN Stopped by: Elayne Snare, MD   pantoprazole 40 MG tablet Commonly known as: PROTONIX Stopped by: Elayne Snare, MD     TAKE these medications   acetaminophen 325 MG tablet Commonly known as: TYLENOL Take 2 tablets (650 mg total) by mouth every 6 (six) hours as needed for mild pain (or Fever >/= 101).   amLODipine 5 MG tablet Commonly known as: NORVASC Take 5 mg by mouth daily.   aspirin 325 MG EC tablet Take 325 mg by mouth daily.   atorvastatin 20 MG tablet Commonly known as: LIPITOR Take 1 tablet by mouth once daily   bisacodyl 5 MG EC tablet Commonly known as: DULCOLAX Take 5 mg by mouth daily as needed for moderate constipation.   cilostazol 100 MG tablet Commonly known as: PLETAL Take 1 tablet by mouth twice daily   docusate sodium 100 MG capsule Commonly known as: COLACE Take 200 mg by mouth daily.   ferrous sulfate 325 (65 FE) MG tablet Take 650 mg by mouth daily.   FREESTYLE LITE test strip Generic drug: glucose blood USE AS INSTRUCTED TO CHECK BLOOD SUGAR ONCE DAILY.   gabapentin 100 MG capsule Commonly known as: NEURONTIN Take 1 capsule twice daily as needed.  Make appointment for full refill   Lantus 100 UNIT/ML injection Generic drug: insulin glargine Inject 14 Units into the skin daily. Inject 14 units under the skin once daily. What changed: Another medication with the same name was removed. Continue taking this medication, and follow the directions you see here. Changed by: Elayne Snare, MD   NovoLIN R 100 units/mL injection Generic drug: insulin regular Inject into the skin 2 (two) times daily before a meal. Inject 10 units under the skin before breakfast and 5 units before dinner. What changed: Another medication with the same name was removed. Continue taking this medication, and follow the directions you see here. Changed by: Elayne Snare, MD   omeprazole 20 MG capsule Commonly known as: PRILOSEC TAKE 1 CAPSULE BY MOUTH ONCE  DAILY ( DUE FOR OFFICE VISIT THIS MONTH)   Vitamin D (Ergocalciferol) 1.25 MG (50000 UNIT) Caps capsule Commonly known as: DRISDOL Take 1 capsule by mouth once a week       Allergies:  Allergies  Allergen Reactions   Morphine And Related Other (See Comments)    Hallucinations    Penicillins Swelling and Rash    Throat swelling Did it involve swelling of the face/tongue/throat, SOB, or low BP? Yes Did it involve sudden or severe rash/hives, skin peeling, or any reaction on the inside of your mouth or nose?  Yes Did you need to seek medical attention at a hospital or doctor's office? Yes When did it last happen?young child If all above answers are NO, may proceed with cephalosporin use.    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   Chronic kidney disease    Diabetes mellitus    Type II per Dr [name]  (patient said type I)   Fibroid    GERD (gastroesophageal reflux disease)    Glaucoma    Hyperlipidemia    Hypertension    Iron deficiency anemia 03/09/2016   Peripheral vascular disease ([place])    Pneumonia 2006   Shortness of breath dyspnea    with exdrtion, "Walkling too fast"   Stroke [facility])    no residual    Past Surgical History:  Procedure Laterality Date   ABDOMINAL HYSTERECTOMY     BIOPSY  06/10/2017   Procedure: BIOPSY;  Surgeon: Ronnette Juniper, MD;  Location: [facility] ENDOSCOPY;  Service: Gastroenterology;;   CERVICAL FUSION     with graft from hip   COLONOSCOPY  July 09, 2012   DIRECT LARYNGOSCOPY N/A 06/07/2014   Procedure: DIRECT LARYNGOSCOPY with BIOPSY and EXCISION VOLLECULAR CYST;  Surgeon: Ruby Cola, MD;  Location: [place];  Service: ENT;  Laterality: N/A;   ESOPHAGOGASTRODUODENOSCOPY (EGD) WITH PROPOFOL Left 06/10/2017   Procedure: ESOPHAGOGASTRODUODENOSCOPY (EGD) WITH PROPOFOL;  Surgeon: Ronnette Juniper, MD;  Location: [place];  Service: Gastroenterology;  Laterality: Left;   FLEXIBLE SIGMOIDOSCOPY Left 06/10/2017   Procedure: FLEXIBLE SIGMOIDOSCOPY;  Surgeon: Ronnette Juniper, MD;  Location: [place];  Service: Gastroenterology;  Laterality: Left;   LOOP RECORDER INSERTION N/A 06/20/2016   Procedure: Loop Recorder Insertion;  Surgeon: [name], MD;  Location: [place] CV LAB;  Service: Cardiovascular;  Laterality: N/A;   REFRACTIVE SURGERY Bilateral    both eyes   SPINE SURGERY     lumbar    Family History  Problem Relation Age of Onset   Cancer Mother    Heart disease Mother    Diabetes Father    Cancer Brother    Cancer Brother    Throat cancer Brother     Social History:  reports that she has been smoking cigarettes. She has a 30.00 pack-year smoking history. She has never used smokeless tobacco. She reports current alcohol use. She reports previous drug use. Drug: Methylphenidate.  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     HYPERLIPIDEMIA: The lipid abnormality consists of elevated LDL controlled with Lipitor 20 mg .   Her baseline LDL was 202  No recent lab results available  Lab Results  Component Value Date   CHOL 150 07/05/2017   HDL 52.90 07/05/2017   LDLCALC 67 07/05/2017   TRIG 155.0 (H) 07/05/2017   CHOLHDL 3 07/05/2017    Vitamin D: Taking 50,000 unit dosage once a month This is monitored by PCP   Lab Results  Component Value Date   VD25OH 40.22 09/13/2016       Last diabetic foot exam  was done in 7/20  Takes Gabapentin 100mg  twice daily for lower leg leg pains Has taken this long-term     Examination:   BP (!)  164/64 (BP Location: Left Arm, Patient Position: Sitting, Cuff Size: Normal)    Pulse 78    Ht 5\' 3"  (1.6 m)    Wt 139 lb 3.2 oz (63.1 kg)    SpO2 96%    BMI 24.66 kg/m   Body mass index is 24.66 kg/m.     ASSESSMENT/ PLAN:     Diabetes type 2 on insulin See history of present illness for detailed discussion of current diabetes management, blood sugar patterns and problems identified  Her A1c is reportedly 6.5, previously 7.6 and was 7.1 earlier this year  A1c may not be completely reliable because of her anemia and renal failure  She has minimal glucose monitoring at home and only a few readings in the mornings which are variable Also her meter is being used by another family member sometimes No hypoglycemia reported but she also has history of hypoglycemia unawareness Her diet appears to be fairly good with small portions and eating less cereal at breakfast now  Since no consistent pattern of high fasting blood sugars are identified will not change her basal insulin as yet Also discussed importance of checking her sugars after dinner and sometimes after breakfast to help adjust her regular insulin May also benefit from taking fructosamine to correlate with her A1c on the next visit   HYPERLIPIDEMIA: Has been on 20 mg Lipitor and needs follow-up labs and ALT level  Hypertension followed by nephrologist now   Patient Instructions  Check blood sugars on waking up days a week  Also check blood sugars about 2 hours after meals and do this after different meals by rotation  Recommended blood sugar levels on waking up are 90-130 and about 2 hours after meal is 130-180  Please bring your blood sugar monitor to each visit, thank you     Elayne Snare 04/24/2019, 9:25 AM     Note: This office note was prepared with Dragon voice recognition system technology. Any transcriptional errors that result from this process are unintentional.

## 2019-04-24 NOTE — Progress Notes (Signed)
Pt tested positive for covid-19 on 02/06/19. This results falls in the 90-day, no re-test window and appointment will be cancelled for today. Pt is aware and verbalizes understanding.    Status:  Final result  Visible to patient:  No (inaccessible in MyChart)  Next appt:  Today at 11:05 AM in [facility] ([facility]) Specimen Information: Nasopharyngeal Swab      Ref Range & Units 2 mo ago  SARS Coronavirus 2 NEGATIVE POSITIVEAbnormal    Comment: RESULT CALLED TO, READ BACK BY AND VERIFIED WITH:  J.ROMAS RN 2046 02/06/19 MCCORMICK K  (NOTE)  SARS-CoV-2 target nucleic acids are DETECTED.  The SARS-CoV-2 RNA is generally detectable in upper and lower  respiratory specimens during the acute phase of infection. Positive  results are indicative of the presence of SARS-CoV-2 RNA. Clinical  correlation with patient history and other diagnostic information is  necessary to determine patient infection status. Positive results do  not rule out bacterial infection or co-infection with other viruses.  The expected result is Negative.  Fact Sheet for Patients:  SugarRoll.be  Fact Sheet for Healthcare Providers:  https://www.woods-mathews.com/  This test is not yet approved or cleared by the [place] FDA and  has been authorized for detection and/or diagnosis of SARS-CoV-2 by  FDA under an Emergency Use Authorization (EUA). This EUA will remain  in effect (meaning this test can be used) for the duration of the  COVID-19 declaration under Section 564(b)(1) of the Act, 21 U.S.C.  section 360bbb-3(b)(1), unless the authorization is terminated or  revoked sooner.  Performed at [facility], [place] [address] [place], [place]  [address]   Resulting Agency  [facility]      Specimen Collected: 02/06/19 15:17 Last Resulted: 02/06/19 20:48         Jacqlyn Larsen, RN

## 2019-04-24 NOTE — Progress Notes (Signed)
Anesthesia Chart Review:  Pt is a same day work up   Case: [id number] Date/Time: 04/27/19 0913   Procedure: INSERTION OF ARTERIOVENOUS (AV) GORE-TEX GRAFT ARM (Left )   Anesthesia type: Monitor Anesthesia Care   Pre-op diagnosis: EMD STAGE RENAL DISEASE   Location: [facility] OR ROOM 11 / [facility]   Surgeons: Angelia Mould, MD      DISCUSSION:  Pt is 69 year old with hx CHF, stroke, PAD (uses pletal for claudication), HTN, DM, asthma, iron deficiency anemia, ESRD (currently hemodialysis TTS via catheter), blind. Hx recurrent syncope- has loop recorder (placed 06/20/16- no arrhythmias identified). Current smoker.   Pt hospitalized 1/8-1/22/21 for syncopal episode; upon admission, found to have COVID-19 (received remdesivir and decadron; required 2L O2) and an acute kidney injury. Discharge summary indicates syncopal episode may have been related to hypoglycemia.   Pt to hold pletal 5 days prior to procedure.     PROVIDERS: - PCP is [name], MD - Cardiologist is Quay Burow, MD. Last office visit 01/18/19 - Endocrinologist is Elayne Snare, MD. Last office visit 04/24/19 - Nephrologist is Jannifer Hick, MD who referred pt for procedure   LABS: Will be obtained day of surgery    IMAGES:  1 view CXR 02/18/19:  - Mild, diffuse interstitial pulmonary opacity, slightly improved compared to prior examination likely reflecting mild edema and/or chronic interstitial change.  - No new or focal airspace opacity.   CT chest 02/06/19:  1. Mild bibasilar atelectasis. 2. Mild emphysematous lung disease.   CT head 02/06/19 (for syncope): 1.  No evidence of acute intracranial abnormality. 2. Moderate chronic small vessel ischemic changes in the cerebral white matter.  EKG 02/06/19: Sinus rhythm. Anteroseptal infarct, old. Nonspecific T abnormalities, lateral leads   CV:  Echo 02/08/19:  1.LV EF, by visual estimation, is 65 to 70%. LV has hyperdynamic function. There is mildly increased LV  wall thickness  2. Left ventricular diastolic parameters are indeterminate  3. Global right ventricle has normal systolc function. RV size is normal.  4. The mitral valve is normal in structure. No evidence of mitral valve regurgitation.  5. The tricuspid valve was normal in structure. Tricuspid valve regurgitation is trivial.  6. The aortic valve is tricuspid. Aortic valve regurgitation is not visualized. Mild aortic valve sclerosis is present, with no evidence of aortic valve stenosis.  7. The inferior vena cava is normal in size with greater than 50% respiratory variability, suggesting right atrial pressure of 3 mmHg.  8. TR signal is inadequate for assessing pulmonary artery systolic pressure.  Carotid duplex 02/08/19:  - Right Carotid: Velocities in the right ICA are consistent with a 1-39%  stenosis.  - Left Carotid: Velocities in the left ICA are consistent with a 1-39%  stenosis. Multiple nodules noted in left thyroid.  - Vertebrals: Bilateral vertebral arteries demonstrate antegrade flow.  - Subclavians: Normal flow hemodynamics were seen in bilateral subclavian arteries.    Past Medical History:  Diagnosis Date  . Asthma    No probnlems recently  . 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 CHF (congestive heart failure) ([place])   . Chronic kidney disease    Tu/Th/Sa  . Diabetes mellitus    Type II per Dr [name]  (patient said type I)  . Fibroid   . GERD (gastroesophageal reflux disease)   . Glaucoma   . Hyperlipidemia   . Hypertension   . Iron  deficiency anemia 03/09/2016  . Peripheral vascular disease ([place])   . Pneumonia 2006  . PONV (postoperative nausea and vomiting)   . Shortness of breath dyspnea    with exdrtion, "Walkling too fast"  . Stroke [facility])    no residual    Past Surgical History:  Procedure Laterality Date  . ABDOMINAL HYSTERECTOMY    . BIOPSY  06/10/2017   Procedure: BIOPSY;  Surgeon: Ronnette Juniper, MD;   Location: [place];  Service: Gastroenterology;;  . CERVICAL FUSION     with graft from hip  . COLONOSCOPY  July 09, 2012  . DIRECT LARYNGOSCOPY N/A 06/07/2014   Procedure: DIRECT LARYNGOSCOPY with BIOPSY and EXCISION VOLLECULAR CYST;  Surgeon: Ruby Cola, MD;  Location: [facility] OR;  Service: ENT;  Laterality: N/A;  . ESOPHAGOGASTRODUODENOSCOPY (EGD) WITH PROPOFOL Left 06/10/2017   Procedure: ESOPHAGOGASTRODUODENOSCOPY (EGD) WITH PROPOFOL;  Surgeon: Ronnette Juniper, MD;  Location: [place];  Service: Gastroenterology;  Laterality: Left;  . FLEXIBLE SIGMOIDOSCOPY Left 06/10/2017   Procedure: FLEXIBLE SIGMOIDOSCOPY;  Surgeon: Ronnette Juniper, MD;  Location: [place];  Service: Gastroenterology;  Laterality: Left;  . LOOP RECORDER INSERTION N/A 06/20/2016   Procedure: Loop Recorder Insertion;  Surgeon: [name], MD;  Location: [place] CV LAB;  Service: Cardiovascular;  Laterality: N/A;  . REFRACTIVE SURGERY Bilateral    both eyes  . SPINE SURGERY     lumbar    MEDICATIONS: No current facility-administered medications for this encounter.   Marland Kitchen acetaminophen (TYLENOL) 325 MG tablet  . amLODipine (NORVASC) 5 MG tablet  . aspirin 325 MG EC tablet  . atorvastatin (LIPITOR) 20 MG tablet  . bisacodyl (DULCOLAX) 5 MG EC tablet  . cilostazol (PLETAL) 100 MG tablet  . docusate sodium (COLACE) 100 MG capsule  . ferrous sulfate 325 (65 FE) MG tablet  . gabapentin (NEURONTIN) 100 MG capsule  . glucose blood (FREESTYLE LITE) test strip  . insulin glargine (LANTUS) 100 UNIT/ML injection  . insulin regular (NOVOLIN R) 100 units/mL injection  . omeprazole (PRILOSEC) 20 MG capsule  . Vitamin D, Ergocalciferol, (DRISDOL) 1.25 MG (50000 UNIT) CAPS capsule   Pt to hold pletal 5 days prior to procedure.    If labs acceptable day of surgery, I anticipate pt can proceed with surgery as scheduled.  Bular Hickok, FNP-BC [facility] Short Stay Surgical Center/Anesthesiology Phone:  [phone] 04/24/2019 1:20 PM

## 2019-04-24 NOTE — Progress Notes (Signed)
Spoke with pt's daughter, Julie Brewer for pre-op call. Pt is blind. Daughter states pt has hx of CHF, but denies any other heart problems. Pt is a type 2 Diabetic. Saw Dr. [name] this morning, he adjusted her Lantus and Novolin insulin today. Daughter states that pt's fasting blood sugar can run from 156-200. Pt's A1C today was 7.6 per Dr. Ronnie Derby notes. Instructed Julie Brewer to have pt take 1/2 of her regular dose of Lantus insulin Monday AM, pt will take 7 units and not to take any of her Novolin insulin. Instructed daughter to have pt check her blood sugar when she gets up Monday AM. If blood sugar is 70 or below, treat with 1/2 cup of clear juice (apple or cranberry) and recheck blood sugar 15 minutes after drinking juice. If blood sugar continues to be 70 or below, call the Short Stay department and ask to speak to a nurse. Julie Brewer voiced understanding.   Pt tested positive for Covid in January (02/06/19) and does not need a Covid test at this time.   Pt's daughter will accompany her to Short Stay due to the fact pt is blind and daughter will need to sign consent.  Pt has a loop recorder due to have syncope. She sees Dr. Gwenlyn Found and has not had any recent syncopal episodes. Per Dr. Kennon Holter note on 12/31/18 there have been no arrhythmias noted on the loop recorder.   Sent chart for Anesthesia review.

## 2019-04-25 DIAGNOSIS — E1129 Type 2 diabetes mellitus with other diabetic kidney complication: Secondary | ICD-10-CM | POA: Diagnosis not present

## 2019-04-25 DIAGNOSIS — N186 End stage renal disease: Secondary | ICD-10-CM | POA: Diagnosis not present

## 2019-04-25 DIAGNOSIS — N2581 Secondary hyperparathyroidism of renal origin: Secondary | ICD-10-CM | POA: Diagnosis not present

## 2019-04-25 DIAGNOSIS — D509 Iron deficiency anemia, unspecified: Secondary | ICD-10-CM | POA: Diagnosis not present

## 2019-04-25 DIAGNOSIS — D631 Anemia in chronic kidney disease: Secondary | ICD-10-CM | POA: Diagnosis not present

## 2019-04-25 DIAGNOSIS — Z992 Dependence on renal dialysis: Secondary | ICD-10-CM | POA: Diagnosis not present

## 2019-04-27 ENCOUNTER — Ambulatory Visit (HOSPITAL_COMMUNITY)
Admission: RE | Admit: 2019-04-27 | Discharge: 2019-04-27 | Disposition: A | Discharge status: Home / Self Care | Payer: Medicare Other | Source: Ambulatory Visit | Attending: Vascular Surgery | Admitting: Vascular Surgery

## 2019-04-27 ENCOUNTER — Ambulatory Visit (HOSPITAL_COMMUNITY): Payer: Medicare Other | Admitting: Emergency Medicine

## 2019-04-27 ENCOUNTER — Other Ambulatory Visit: Payer: Self-pay

## 2019-04-27 ENCOUNTER — Encounter (HOSPITAL_COMMUNITY)
Admission: RE | Disposition: A | Discharge status: Home / Self Care | Payer: Self-pay | Source: Ambulatory Visit | Attending: Vascular Surgery

## 2019-04-27 ENCOUNTER — Encounter (HOSPITAL_COMMUNITY): Payer: Self-pay | Admitting: Vascular Surgery

## 2019-04-27 DIAGNOSIS — Z880 Allergy status to penicillin: Secondary | ICD-10-CM | POA: Diagnosis not present

## 2019-04-27 DIAGNOSIS — Z885 Allergy status to narcotic agent status: Secondary | ICD-10-CM | POA: Insufficient documentation

## 2019-04-27 DIAGNOSIS — K219 Gastro-esophageal reflux disease without esophagitis: Secondary | ICD-10-CM | POA: Diagnosis not present

## 2019-04-27 DIAGNOSIS — N185 Chronic kidney disease, stage 5: Secondary | ICD-10-CM | POA: Diagnosis not present

## 2019-04-27 DIAGNOSIS — J45909 Unspecified asthma, uncomplicated: Secondary | ICD-10-CM | POA: Insufficient documentation

## 2019-04-27 DIAGNOSIS — H42 Glaucoma in diseases classified elsewhere: Secondary | ICD-10-CM | POA: Diagnosis not present

## 2019-04-27 DIAGNOSIS — E785 Hyperlipidemia, unspecified: Secondary | ICD-10-CM | POA: Diagnosis not present

## 2019-04-27 DIAGNOSIS — E1139 Type 2 diabetes mellitus with other diabetic ophthalmic complication: Secondary | ICD-10-CM | POA: Insufficient documentation

## 2019-04-27 DIAGNOSIS — Z79899 Other long term (current) drug therapy: Secondary | ICD-10-CM | POA: Diagnosis not present

## 2019-04-27 DIAGNOSIS — I509 Heart failure, unspecified: Secondary | ICD-10-CM | POA: Diagnosis not present

## 2019-04-27 DIAGNOSIS — Z7982 Long term (current) use of aspirin: Secondary | ICD-10-CM | POA: Insufficient documentation

## 2019-04-27 DIAGNOSIS — N186 End stage renal disease: Secondary | ICD-10-CM | POA: Diagnosis not present

## 2019-04-27 DIAGNOSIS — E1151 Type 2 diabetes mellitus with diabetic peripheral angiopathy without gangrene: Secondary | ICD-10-CM | POA: Diagnosis not present

## 2019-04-27 DIAGNOSIS — E1122 Type 2 diabetes mellitus with diabetic chronic kidney disease: Secondary | ICD-10-CM | POA: Insufficient documentation

## 2019-04-27 DIAGNOSIS — H409 Unspecified glaucoma: Secondary | ICD-10-CM | POA: Insufficient documentation

## 2019-04-27 DIAGNOSIS — Z794 Long term (current) use of insulin: Secondary | ICD-10-CM | POA: Insufficient documentation

## 2019-04-27 DIAGNOSIS — Z8673 Personal history of transient ischemic attack (TIA), and cerebral infarction without residual deficits: Secondary | ICD-10-CM | POA: Diagnosis not present

## 2019-04-27 DIAGNOSIS — Z992 Dependence on renal dialysis: Secondary | ICD-10-CM | POA: Insufficient documentation

## 2019-04-27 DIAGNOSIS — I132 Hypertensive heart and chronic kidney disease with heart failure and with stage 5 chronic kidney disease, or end stage renal disease: Secondary | ICD-10-CM | POA: Insufficient documentation

## 2019-04-27 HISTORY — PX: AV FISTULA PLACEMENT: SHX1204

## 2019-04-27 HISTORY — DX: Heart failure, unspecified: I50.9

## 2019-04-27 HISTORY — DX: Other specified postprocedural states: Z98.890

## 2019-04-27 HISTORY — DX: Nausea with vomiting, unspecified: R11.2

## 2019-04-27 LAB — POCT I-STAT, CHEM 8
BUN: 24 mg/dL — ABNORMAL HIGH (ref 8–23)
Calcium, Ion: 0.94 mmol/L — ABNORMAL LOW (ref 1.15–1.40)
Chloride: 104 mmol/L (ref 98–111)
Creatinine, Ser: 3.1 mg/dL — ABNORMAL HIGH (ref 0.44–1.00)
Glucose, Bld: 128 mg/dL — ABNORMAL HIGH (ref 70–99)
HCT: 38 % (ref 36.0–46.0)
Hemoglobin: 12.9 g/dL (ref 12.0–15.0)
Potassium: 3.5 mmol/L (ref 3.5–5.1)
Sodium: 138 mmol/L (ref 135–145)
TCO2: 27 mmol/L (ref 22–32)

## 2019-04-27 LAB — GLUCOSE, CAPILLARY
Glucose-Capillary: 161 mg/dL — ABNORMAL HIGH (ref 70–99)
Glucose-Capillary: 114 mg/dL — ABNORMAL HIGH (ref 70–99)

## 2019-04-27 SURGERY — INSERTION OF ARTERIOVENOUS (AV) GORE-TEX GRAFT ARM
Anesthesia: Monitor Anesthesia Care | Laterality: Left

## 2019-04-27 MED ORDER — FENTANYL CITRATE (PF) 100 MCG/2ML IJ SOLN: 25.0000 ug | INTRAMUSCULAR | Status: DC | PRN

## 2019-04-27 MED ORDER — PHENYLEPHRINE HCL-NACL 10-0.9 MG/250ML-% IV SOLN
INTRAVENOUS | Status: DC | PRN
  Administered 2019-04-27: 25 ug/min via INTRAVENOUS

## 2019-04-27 MED ORDER — HEMOSTATIC AGENTS (NO CHARGE) OPTIME
TOPICAL | Status: DC | PRN
  Administered 2019-04-27: 1 via TOPICAL

## 2019-04-27 MED ORDER — SODIUM CHLORIDE 0.9 % IV SOLN
INTRAVENOUS | Status: DC | PRN
  Administered 2019-04-27: 500 mL

## 2019-04-27 MED ORDER — HEPARIN SODIUM (PORCINE) 1000 UNIT/ML IJ SOLN
INTRAMUSCULAR | Status: AC
  Filled 2019-04-27: qty 1

## 2019-04-27 MED ORDER — SODIUM CHLORIDE 0.9 % IV SOLN: INTRAVENOUS | Status: DC

## 2019-04-27 MED ORDER — LIDOCAINE HCL (PF) 1 % IJ SOLN
INTRAMUSCULAR | Status: AC
  Filled 2019-04-27: qty 30

## 2019-04-27 MED ORDER — PROTAMINE SULFATE 10 MG/ML IV SOLN
INTRAVENOUS | Status: AC
  Filled 2019-04-27: qty 25

## 2019-04-27 MED ORDER — CHLORHEXIDINE GLUCONATE 4 % EX LIQD: 60.0000 mL | Freq: Once | CUTANEOUS | Status: DC

## 2019-04-27 MED ORDER — MIDAZOLAM HCL 5 MG/5ML IJ SOLN
INTRAMUSCULAR | Status: DC | PRN
  Administered 2019-04-27: .5 mg via INTRAVENOUS
  Administered 2019-04-27: 1 mg via INTRAVENOUS
  Administered 2019-04-27: .5 mg via INTRAVENOUS

## 2019-04-27 MED ORDER — PROPOFOL 500 MG/50ML IV EMUL
INTRAVENOUS | Status: DC | PRN
  Administered 2019-04-27: 35 ug/kg/min via INTRAVENOUS

## 2019-04-27 MED ORDER — OXYCODONE HCL 5 MG PO TABS: 5.0000 mg | ORAL_TABLET | ORAL | 0 refills | Status: DC | PRN

## 2019-04-27 MED ORDER — LIDOCAINE-EPINEPHRINE 1 %-1:100000 IJ SOLN
INTRAMUSCULAR | Status: AC
  Filled 2019-04-27: qty 1

## 2019-04-27 MED ORDER — FENTANYL CITRATE (PF) 250 MCG/5ML IJ SOLN
INTRAMUSCULAR | Status: AC
  Filled 2019-04-27: qty 5

## 2019-04-27 MED ORDER — 0.9 % SODIUM CHLORIDE (POUR BTL) OPTIME
TOPICAL | Status: DC | PRN
  Administered 2019-04-27: 1000 mL

## 2019-04-27 MED ORDER — FENTANYL CITRATE (PF) 100 MCG/2ML IJ SOLN
INTRAMUSCULAR | Status: DC | PRN
  Administered 2019-04-27 (×4): 25 ug via INTRAVENOUS

## 2019-04-27 MED ORDER — PROTAMINE SULFATE 10 MG/ML IV SOLN
INTRAVENOUS | Status: DC | PRN
  Administered 2019-04-27: 40 mg via INTRAVENOUS

## 2019-04-27 MED ORDER — VANCOMYCIN HCL IN DEXTROSE 1-5 GM/200ML-% IV SOLN
1000.0000 mg | INTRAVENOUS | Status: AC
  Administered 2019-04-27: 1000 mg via INTRAVENOUS
  Filled 2019-04-27: qty 200

## 2019-04-27 MED ORDER — HEPARIN SODIUM (PORCINE) 1000 UNIT/ML IJ SOLN
INTRAMUSCULAR | Status: DC | PRN
  Administered 2019-04-27: 6000 [IU] via INTRAVENOUS

## 2019-04-27 MED ORDER — LIDOCAINE-EPINEPHRINE (PF) 1 %-1:200000 IJ SOLN
INTRAMUSCULAR | Status: DC | PRN
  Administered 2019-04-27: 20 mL

## 2019-04-27 MED ORDER — MIDAZOLAM HCL 2 MG/2ML IJ SOLN
INTRAMUSCULAR | Status: AC
  Filled 2019-04-27: qty 2

## 2019-04-27 MED ORDER — SODIUM CHLORIDE 0.9 % IV SOLN
INTRAVENOUS | Status: AC
  Filled 2019-04-27: qty 1.2

## 2019-04-27 MED ORDER — PROPOFOL 10 MG/ML IV BOLUS
INTRAVENOUS | Status: AC
  Filled 2019-04-27: qty 20

## 2019-04-27 MED ORDER — PAPAVERINE HCL 30 MG/ML IJ SOLN
INTRAMUSCULAR | Status: DC | PRN
  Administered 2019-04-27: 60 mg

## 2019-04-27 MED ORDER — PAPAVERINE HCL 30 MG/ML IJ SOLN
INTRAMUSCULAR | Status: AC
  Filled 2019-04-27: qty 2

## 2019-04-27 MED ORDER — LIDOCAINE HCL (PF) 1 % IJ SOLN
INTRAMUSCULAR | Status: DC | PRN
  Administered 2019-04-27: 13 mL

## 2019-04-27 SURGICAL SUPPLY — 40 items
ADH SKN CLS APL DERMABOND .7 (GAUZE/BANDAGES/DRESSINGS) ×1
ARMBAND PINK RESTRICT EXTREMIT (MISCELLANEOUS) ×3 IMPLANT
CANISTER SUCT 3000ML PPV (MISCELLANEOUS) ×2 IMPLANT
CANNULA VESSEL 3MM 2 BLNT TIP (CANNULA) ×3 IMPLANT
CLIP VESOCCLUDE MED 6/CT (CLIP) ×2 IMPLANT
CLIP VESOCCLUDE SM WIDE 6/CT (CLIP) ×3 IMPLANT
COVER WAND RF STERILE (DRAPES) ×1 IMPLANT
DECANTER SPIKE VIAL GLASS SM (MISCELLANEOUS) ×1 IMPLANT
DERMABOND ADVANCED (GAUZE/BANDAGES/DRESSINGS) ×1
DERMABOND ADVANCED .7 DNX12 (GAUZE/BANDAGES/DRESSINGS) ×1 IMPLANT
ELECT REM PT RETURN 9FT ADLT (ELECTROSURGICAL) ×2
ELECTRODE REM PT RTRN 9FT ADLT (ELECTROSURGICAL) ×1 IMPLANT
GLOVE BIO SURGEON STRL SZ 6.5 (GLOVE) ×1 IMPLANT
GLOVE BIO SURGEON STRL SZ7.5 (GLOVE) ×2 IMPLANT
GLOVE BIOGEL PI IND STRL 6.5 (GLOVE) IMPLANT
GLOVE BIOGEL PI IND STRL 8 (GLOVE) ×1 IMPLANT
GLOVE BIOGEL PI INDICATOR 6.5 (GLOVE) ×4
GLOVE BIOGEL PI INDICATOR 8 (GLOVE) ×1
GOWN STRL REUS W/ TWL LRG LVL3 (GOWN DISPOSABLE) ×3 IMPLANT
GOWN STRL REUS W/TWL LRG LVL3 (GOWN DISPOSABLE) ×6
GRAFT GORETEX STRT 4-7X45 (Vascular Products) ×1 IMPLANT
KIT BASIN OR (CUSTOM PROCEDURE TRAY) ×2 IMPLANT
KIT TURNOVER KIT B (KITS) ×2 IMPLANT
NDL 18GX1X1/2 (RX/OR ONLY) (NEEDLE) IMPLANT
NEEDLE 18GX1X1/2 (RX/OR ONLY) (NEEDLE) ×2 IMPLANT
NS IRRIG 1000ML POUR BTL (IV SOLUTION) ×2 IMPLANT
PACK CV ACCESS (CUSTOM PROCEDURE TRAY) ×2 IMPLANT
PAD ARMBOARD 7.5X6 YLW CONV (MISCELLANEOUS) ×4 IMPLANT
SPONGE LAP 18X18 RF (DISPOSABLE) ×1 IMPLANT
SPONGE SURGIFOAM ABS GEL 100 (HEMOSTASIS) IMPLANT
SUT PROLENE 6 0 BV (SUTURE) ×6 IMPLANT
SUT VIC AB 3-0 SH 27 (SUTURE) ×4
SUT VIC AB 3-0 SH 27X BRD (SUTURE) ×2 IMPLANT
SUT VICRYL 4-0 PS2 18IN ABS (SUTURE) ×4 IMPLANT
SYR 20ML LL LF (SYRINGE) ×1 IMPLANT
SYR 3ML LL SCALE MARK (SYRINGE) ×1 IMPLANT
SYR TOOMEY 50ML (SYRINGE) IMPLANT
TOWEL GREEN STERILE (TOWEL DISPOSABLE) ×2 IMPLANT
UNDERPAD 30X30 (UNDERPADS AND DIAPERS) ×2 IMPLANT
WATER STERILE IRR 1000ML POUR (IV SOLUTION) ×2 IMPLANT

## 2019-04-27 NOTE — Transfer of Care (Signed)
Immediate Anesthesia Transfer of Care Note  Patient: Julie Brewer  Procedure(s) Performed: INSERTION OF ARTERIOVENOUS (AV) GORE-TEX GRAFT LEFT UPPER ARM (Left )  Patient Location: PACU  Anesthesia Type:MAC  Level of Consciousness: awake, alert , oriented and sedated  Airway & Oxygen Therapy: Patient Spontanous Breathing and Patient connected to nasal cannula oxygen  Post-op Assessment: Report given to RN, Post -op Vital signs reviewed and stable and Patient moving all extremities  Post vital signs: Reviewed and stable  Last Vitals:  Vitals Value Taken Time  BP 133/60 04/27/19 1152  Temp    Pulse 72 04/27/19 1154  Resp 21 04/27/19 1154  SpO2 95 % 04/27/19 1154  Vitals shown include unvalidated device data.  Last Pain:  Vitals:   04/27/19 0729  TempSrc:   PainSc: 0-No pain      Patients Stated Pain Goal: 1 (01/22/48 7530)  Complications: No apparent anesthesia complications

## 2019-04-27 NOTE — Anesthesia Preprocedure Evaluation (Signed)
Anesthesia Evaluation  Patient identified by MRN, date of birth, ID band Patient awake    Reviewed: Allergy & Precautions, NPO status , Patient's Chart, lab work & pertinent test results  History of Anesthesia Complications (+) PONV  Airway Mallampati: II  TM Distance: >3 FB Neck ROM: Full    Dental  (+) Dental Advisory Given   Pulmonary asthma , Current Smoker and Patient abstained from smoking.,    breath sounds clear to auscultation       Cardiovascular hypertension, + Peripheral Vascular Disease and +CHF   Rhythm:Regular Rate:Normal     Neuro/Psych CVA    GI/Hepatic Neg liver ROS, GERD  ,  Endo/Other  diabetes  Renal/GU ESRF and DialysisRenal disease     Musculoskeletal   Abdominal   Peds  Hematology negative hematology ROS (+)   Anesthesia Other Findings   Reproductive/Obstetrics                             Lab Results  Component Value Date   WBC 16.6 (H) 02/20/2019   HGB 12.9 04/27/2019   HCT 38.0 04/27/2019   MCV 77.1 (L) 02/20/2019   PLT 338 02/20/2019   Lab Results  Component Value Date   CREATININE 3.10 (H) 04/27/2019   BUN 24 (H) 04/27/2019   NA 138 04/27/2019   K 3.5 04/27/2019   CL 104 04/27/2019   CO2 20 (L) 02/20/2019    Anesthesia Physical Anesthesia Plan  ASA: III  Anesthesia Plan: MAC   Post-op Pain Management:    Induction: Intravenous  PONV Risk Score and Plan: 2 and Propofol infusion, Ondansetron and Treatment may vary due to age or medical condition  Airway Management Planned: Natural Airway and Simple Face Mask  Additional Equipment:   Intra-op Plan:   Post-operative Plan:   Informed Consent: I have reviewed the patients History and Physical, chart, labs and discussed the procedure including the risks, benefits and alternatives for the proposed anesthesia with the patient or authorized representative who has indicated his/her  understanding and acceptance.       Plan Discussed with: CRNA  Anesthesia Plan Comments:         Anesthesia Quick Evaluation

## 2019-04-27 NOTE — Interval H&P Note (Signed)
History and Physical Interval Note:  04/27/2019 9:28 AM  Julie Brewer  has presented today for surgery, with the diagnosis of EMD STAGE RENAL DISEASE.  The various methods of treatment have been discussed with the patient and family. After consideration of risks, benefits and other options for treatment, the patient has consented to  Procedure(s): INSERTION OF ARTERIOVENOUS (AV) GORE-TEX GRAFT ARM (Left) as a surgical intervention.  The patient's history has been reviewed, patient examined, no change in status, stable for surgery.  I have reviewed the patient's chart and labs.  Questions were answered to the patient's satisfaction.     Deitra Mayo

## 2019-04-27 NOTE — Anesthesia Postprocedure Evaluation (Signed)
Anesthesia Post Note  Patient: Julie Brewer  Procedure(s) Performed: INSERTION OF ARTERIOVENOUS (AV) GORE-TEX GRAFT LEFT UPPER ARM (Left )     Patient location during evaluation: PACU Anesthesia Type: MAC Level of consciousness: awake and alert Pain management: pain level controlled Vital Signs Assessment: post-procedure vital signs reviewed and stable Respiratory status: spontaneous breathing, nonlabored ventilation, respiratory function stable and patient connected to nasal cannula oxygen Cardiovascular status: stable and blood pressure returned to baseline Postop Assessment: no apparent nausea or vomiting Anesthetic complications: no    Last Vitals:  Vitals:   04/27/19 1230 04/27/19 1240  BP:  (!) 166/66  Pulse: 73 73  Resp: 16 18  Temp:  36.5 C  SpO2: 93% 93%    Last Pain:  Vitals:   04/27/19 1230  TempSrc:   PainSc: 0-No pain                 Tiajuana Amass

## 2019-04-27 NOTE — Progress Notes (Signed)
Pt. Arrived to short stay with a dialysis catheter in right chest. Katy CRNA accessed pt. Catheter.

## 2019-04-27 NOTE — Op Note (Signed)
    NAME: STUART GUILLEN    MRN: [id number] DOB: 1950/02/13    DATE OF OPERATION: 04/27/2019  PREOP DIAGNOSIS:    End-stage renal disease  POSTOP DIAGNOSIS:    Same  PROCEDURE:    New left upper arm AV graft (4-7 mm PTFE graft)  SURGEON: Judeth Cornfield. Scot Dock, MD  ASSIST: [place] Cellar, RNFA  ANESTHESIA: Local with sedation  EBL: Minimal  INDICATIONS:    Julie Brewer is a 69 y.o. female who presents for new access.  FINDINGS:   Excellent thrill in the upper arm graft at the completion of the procedure.  Palpable left radial pulse.  TECHNIQUE:   The patient was taken to the operating room and sedated by anesthesia.  The left upper extremity was prepped and draped in usual sterile fashion.  After the skin was anesthetized with 1% lidocaine, a longitudinal incision was made over the brachial artery just above the antecubital level.  Here the artery was dissected free.  It was about a 3 millimeter artery.  A separate longitudinal incision was made beneath the axilla after the skin was anesthetized.  The vein was very small but I dissected deeper and identified the larger brachial vein.  A 4-7 mm PTFE graft was tunneled between the 2 incisions and the patient was heparinized.  The brachial artery was clamped proximally distally and a longitudinal arteriotomy was made.  Only a short segment of the 4 mm end of the graft was excised, the graft spatulated and sewn end-to-side to the artery using continuous 6-0 Prolene suture.  The graft was then for the appropriate length for anastomosis to the high brachial vein.  The vein was ligated distally and spatulated proximally.  The graft was spatulated, cut to the appropriate length and sewn into into the vein using continuous 6-0 Prolene suture.  At the completion was an excellent thrill in the fistula.  There was a palpable radial pulse.  There was a good radial and ulnar signal with a Doppler.  The heparin was partially reversed  with protamine.  Hemostasis was obtained in the wounds and the wounds were each closed with a deep layer of 3-0 Vicryl and the skin closed with 4-0 Vicryl.  Dermabond was applied.  The patient tolerated the procedure well and was transferred to the recovery room in stable condition.  All needle and sponge counts were correct.  Deitra Mayo, MD, FACS [facility]  DATE OF DICTATION:   04/27/2019

## 2019-04-28 ENCOUNTER — Encounter: Payer: Self-pay | Admitting: *Deleted

## 2019-04-28 DIAGNOSIS — D509 Iron deficiency anemia, unspecified: Secondary | ICD-10-CM | POA: Diagnosis not present

## 2019-04-28 DIAGNOSIS — N2581 Secondary hyperparathyroidism of renal origin: Secondary | ICD-10-CM | POA: Diagnosis not present

## 2019-04-28 DIAGNOSIS — E1129 Type 2 diabetes mellitus with other diabetic kidney complication: Secondary | ICD-10-CM | POA: Diagnosis not present

## 2019-04-28 DIAGNOSIS — N186 End stage renal disease: Secondary | ICD-10-CM | POA: Diagnosis not present

## 2019-04-28 DIAGNOSIS — D631 Anemia in chronic kidney disease: Secondary | ICD-10-CM | POA: Diagnosis not present

## 2019-04-28 DIAGNOSIS — Z992 Dependence on renal dialysis: Secondary | ICD-10-CM | POA: Diagnosis not present

## 2019-04-30 DIAGNOSIS — D631 Anemia in chronic kidney disease: Secondary | ICD-10-CM | POA: Diagnosis not present

## 2019-04-30 DIAGNOSIS — E876 Hypokalemia: Secondary | ICD-10-CM | POA: Diagnosis not present

## 2019-04-30 DIAGNOSIS — N2581 Secondary hyperparathyroidism of renal origin: Secondary | ICD-10-CM | POA: Diagnosis not present

## 2019-04-30 DIAGNOSIS — Z992 Dependence on renal dialysis: Secondary | ICD-10-CM | POA: Diagnosis not present

## 2019-04-30 DIAGNOSIS — D509 Iron deficiency anemia, unspecified: Secondary | ICD-10-CM | POA: Diagnosis not present

## 2019-04-30 DIAGNOSIS — E1129 Type 2 diabetes mellitus with other diabetic kidney complication: Secondary | ICD-10-CM | POA: Diagnosis not present

## 2019-04-30 DIAGNOSIS — Z23 Encounter for immunization: Secondary | ICD-10-CM | POA: Diagnosis not present

## 2019-04-30 DIAGNOSIS — E1122 Type 2 diabetes mellitus with diabetic chronic kidney disease: Secondary | ICD-10-CM | POA: Diagnosis not present

## 2019-04-30 DIAGNOSIS — N186 End stage renal disease: Secondary | ICD-10-CM | POA: Diagnosis not present

## 2019-05-02 DIAGNOSIS — Z992 Dependence on renal dialysis: Secondary | ICD-10-CM | POA: Diagnosis not present

## 2019-05-02 DIAGNOSIS — E1129 Type 2 diabetes mellitus with other diabetic kidney complication: Secondary | ICD-10-CM | POA: Diagnosis not present

## 2019-05-02 DIAGNOSIS — N186 End stage renal disease: Secondary | ICD-10-CM | POA: Diagnosis not present

## 2019-05-02 DIAGNOSIS — D509 Iron deficiency anemia, unspecified: Secondary | ICD-10-CM | POA: Diagnosis not present

## 2019-05-02 DIAGNOSIS — E876 Hypokalemia: Secondary | ICD-10-CM | POA: Diagnosis not present

## 2019-05-02 DIAGNOSIS — N2581 Secondary hyperparathyroidism of renal origin: Secondary | ICD-10-CM | POA: Diagnosis not present

## 2019-05-05 DIAGNOSIS — D509 Iron deficiency anemia, unspecified: Secondary | ICD-10-CM | POA: Diagnosis not present

## 2019-05-05 DIAGNOSIS — Z992 Dependence on renal dialysis: Secondary | ICD-10-CM | POA: Diagnosis not present

## 2019-05-05 DIAGNOSIS — N186 End stage renal disease: Secondary | ICD-10-CM | POA: Diagnosis not present

## 2019-05-05 DIAGNOSIS — E1129 Type 2 diabetes mellitus with other diabetic kidney complication: Secondary | ICD-10-CM | POA: Diagnosis not present

## 2019-05-05 DIAGNOSIS — N2581 Secondary hyperparathyroidism of renal origin: Secondary | ICD-10-CM | POA: Diagnosis not present

## 2019-05-05 DIAGNOSIS — E876 Hypokalemia: Secondary | ICD-10-CM | POA: Diagnosis not present

## 2019-05-07 ENCOUNTER — Other Ambulatory Visit: Payer: Self-pay | Admitting: Endocrinology

## 2019-05-07 DIAGNOSIS — N2581 Secondary hyperparathyroidism of renal origin: Secondary | ICD-10-CM | POA: Diagnosis not present

## 2019-05-07 DIAGNOSIS — D509 Iron deficiency anemia, unspecified: Secondary | ICD-10-CM | POA: Diagnosis not present

## 2019-05-07 DIAGNOSIS — E441 Mild protein-calorie malnutrition: Secondary | ICD-10-CM | POA: Insufficient documentation

## 2019-05-07 DIAGNOSIS — E1129 Type 2 diabetes mellitus with other diabetic kidney complication: Secondary | ICD-10-CM | POA: Diagnosis not present

## 2019-05-07 DIAGNOSIS — N186 End stage renal disease: Secondary | ICD-10-CM | POA: Diagnosis not present

## 2019-05-07 DIAGNOSIS — E876 Hypokalemia: Secondary | ICD-10-CM | POA: Diagnosis not present

## 2019-05-07 DIAGNOSIS — Z992 Dependence on renal dialysis: Secondary | ICD-10-CM | POA: Diagnosis not present

## 2019-05-09 DIAGNOSIS — Z992 Dependence on renal dialysis: Secondary | ICD-10-CM | POA: Diagnosis not present

## 2019-05-09 DIAGNOSIS — D509 Iron deficiency anemia, unspecified: Secondary | ICD-10-CM | POA: Diagnosis not present

## 2019-05-09 DIAGNOSIS — E1129 Type 2 diabetes mellitus with other diabetic kidney complication: Secondary | ICD-10-CM | POA: Diagnosis not present

## 2019-05-09 DIAGNOSIS — E876 Hypokalemia: Secondary | ICD-10-CM | POA: Diagnosis not present

## 2019-05-09 DIAGNOSIS — N2581 Secondary hyperparathyroidism of renal origin: Secondary | ICD-10-CM | POA: Diagnosis not present

## 2019-05-09 DIAGNOSIS — N186 End stage renal disease: Secondary | ICD-10-CM | POA: Diagnosis not present

## 2019-05-11 DIAGNOSIS — E876 Hypokalemia: Secondary | ICD-10-CM | POA: Insufficient documentation

## 2019-05-12 ENCOUNTER — Telehealth: Payer: Self-pay | Admitting: Endocrinology

## 2019-05-12 ENCOUNTER — Other Ambulatory Visit: Payer: Self-pay

## 2019-05-12 DIAGNOSIS — E1165 Type 2 diabetes mellitus with hyperglycemia: Secondary | ICD-10-CM

## 2019-05-12 MED ORDER — FREESTYLE LITE TEST VI STRP: ORAL_STRIP | 3 refills | Status: AC

## 2019-05-12 NOTE — Telephone Encounter (Signed)
Have you contacted your pharmacy to initiate the refill request? Yes           If no then please ask them to call the pharmacy to start the request but if they insist proceed to next questions  What is the name of the medication/medications? gabapentin         If it is for test strips or lancets please confirm the brand  Is this for a 90 day? yes  What is the name and location of the pharmacy you would like to use?    [place] ([place]), [place] - [address] Phone:  [phone]  Fax:  [phone]

## 2019-05-12 NOTE — Telephone Encounter (Signed)
Rx sent 

## 2019-05-13 ENCOUNTER — Other Ambulatory Visit: Payer: Self-pay

## 2019-05-13 MED ORDER — GABAPENTIN 100 MG PO CAPS: ORAL_CAPSULE | ORAL | 2 refills | Status: DC

## 2019-05-13 NOTE — Telephone Encounter (Signed)
Patients family called to advise that the medication has not been received by the pharmacy.  They are requesting that the Gabapentin be resent to the pharmacy and they would like a call when this is done at [phone]

## 2019-05-13 NOTE — Telephone Encounter (Signed)
Rx was sent and successfully received by pharmacy on 04/05/2019 AEB receipt confirmation after Rx was sent. Information copied below from Rx submission to pharmacy. gabapentin (NEURONTIN) 100 MG capsule 60 capsule 0 04/05/2019    Sig: Take 1 capsule twice daily as needed.  Make appointment for full refill   Patient taking differently: Take 100 mg by mouth 2 (two) times daily. Take 1 capsule twice daily  Make appointment for full refill       Sent to pharmacy as: gabapentin (NEURONTIN) 100 MG capsule   E-Prescribing Status: Receipt confirmed by pharmacy (04/05/2019  2:07 PM EST)     However, Rx will be sent again and pt will be called and notified.

## 2019-05-13 NOTE — Telephone Encounter (Signed)
New submission Rx informaion copied below. gabapentin (NEURONTIN) 100 MG capsule 60 capsule 2 05/13/2019    Sig: Take 1 capsule twice daily as needed.   Sent to pharmacy as: gabapentin (NEURONTIN) 100 MG capsule   E-Prescribing Status: Receipt confirmed by pharmacy (05/13/2019  1:36 PM EDT)     Called pt and informed her that this Rx has been sent and it does show that it was successfully received by the pharmacy. Pt verbalized understanding.

## 2019-05-14 DIAGNOSIS — N186 End stage renal disease: Secondary | ICD-10-CM | POA: Diagnosis not present

## 2019-05-14 DIAGNOSIS — D509 Iron deficiency anemia, unspecified: Secondary | ICD-10-CM | POA: Diagnosis not present

## 2019-05-14 DIAGNOSIS — N2581 Secondary hyperparathyroidism of renal origin: Secondary | ICD-10-CM | POA: Diagnosis not present

## 2019-05-14 DIAGNOSIS — Z992 Dependence on renal dialysis: Secondary | ICD-10-CM | POA: Diagnosis not present

## 2019-05-14 DIAGNOSIS — E1129 Type 2 diabetes mellitus with other diabetic kidney complication: Secondary | ICD-10-CM | POA: Diagnosis not present

## 2019-05-14 DIAGNOSIS — E876 Hypokalemia: Secondary | ICD-10-CM | POA: Diagnosis not present

## 2019-05-16 DIAGNOSIS — N2581 Secondary hyperparathyroidism of renal origin: Secondary | ICD-10-CM | POA: Diagnosis not present

## 2019-05-16 DIAGNOSIS — E1129 Type 2 diabetes mellitus with other diabetic kidney complication: Secondary | ICD-10-CM | POA: Diagnosis not present

## 2019-05-16 DIAGNOSIS — Z992 Dependence on renal dialysis: Secondary | ICD-10-CM | POA: Diagnosis not present

## 2019-05-16 DIAGNOSIS — E876 Hypokalemia: Secondary | ICD-10-CM | POA: Diagnosis not present

## 2019-05-16 DIAGNOSIS — N186 End stage renal disease: Secondary | ICD-10-CM | POA: Diagnosis not present

## 2019-05-16 DIAGNOSIS — D509 Iron deficiency anemia, unspecified: Secondary | ICD-10-CM | POA: Diagnosis not present

## 2019-05-19 DIAGNOSIS — E876 Hypokalemia: Secondary | ICD-10-CM | POA: Diagnosis not present

## 2019-05-19 DIAGNOSIS — Z992 Dependence on renal dialysis: Secondary | ICD-10-CM | POA: Diagnosis not present

## 2019-05-19 DIAGNOSIS — D509 Iron deficiency anemia, unspecified: Secondary | ICD-10-CM | POA: Diagnosis not present

## 2019-05-19 DIAGNOSIS — N2581 Secondary hyperparathyroidism of renal origin: Secondary | ICD-10-CM | POA: Diagnosis not present

## 2019-05-19 DIAGNOSIS — N186 End stage renal disease: Secondary | ICD-10-CM | POA: Diagnosis not present

## 2019-05-19 DIAGNOSIS — E1129 Type 2 diabetes mellitus with other diabetic kidney complication: Secondary | ICD-10-CM | POA: Diagnosis not present

## 2019-05-21 DIAGNOSIS — N2581 Secondary hyperparathyroidism of renal origin: Secondary | ICD-10-CM | POA: Diagnosis not present

## 2019-05-21 DIAGNOSIS — N186 End stage renal disease: Secondary | ICD-10-CM | POA: Diagnosis not present

## 2019-05-21 DIAGNOSIS — E1129 Type 2 diabetes mellitus with other diabetic kidney complication: Secondary | ICD-10-CM | POA: Diagnosis not present

## 2019-05-21 DIAGNOSIS — D509 Iron deficiency anemia, unspecified: Secondary | ICD-10-CM | POA: Diagnosis not present

## 2019-05-21 DIAGNOSIS — Z992 Dependence on renal dialysis: Secondary | ICD-10-CM | POA: Diagnosis not present

## 2019-05-21 DIAGNOSIS — E876 Hypokalemia: Secondary | ICD-10-CM | POA: Diagnosis not present

## 2019-05-23 DIAGNOSIS — E876 Hypokalemia: Secondary | ICD-10-CM | POA: Diagnosis not present

## 2019-05-23 DIAGNOSIS — Z992 Dependence on renal dialysis: Secondary | ICD-10-CM | POA: Diagnosis not present

## 2019-05-23 DIAGNOSIS — N2581 Secondary hyperparathyroidism of renal origin: Secondary | ICD-10-CM | POA: Diagnosis not present

## 2019-05-23 DIAGNOSIS — D509 Iron deficiency anemia, unspecified: Secondary | ICD-10-CM | POA: Diagnosis not present

## 2019-05-23 DIAGNOSIS — E1129 Type 2 diabetes mellitus with other diabetic kidney complication: Secondary | ICD-10-CM | POA: Diagnosis not present

## 2019-05-23 DIAGNOSIS — N186 End stage renal disease: Secondary | ICD-10-CM | POA: Diagnosis not present

## 2019-05-25 ENCOUNTER — Other Ambulatory Visit: Payer: Self-pay | Admitting: Vascular Surgery

## 2019-05-26 DIAGNOSIS — E1129 Type 2 diabetes mellitus with other diabetic kidney complication: Secondary | ICD-10-CM | POA: Diagnosis not present

## 2019-05-26 DIAGNOSIS — Z992 Dependence on renal dialysis: Secondary | ICD-10-CM | POA: Diagnosis not present

## 2019-05-26 DIAGNOSIS — D509 Iron deficiency anemia, unspecified: Secondary | ICD-10-CM | POA: Diagnosis not present

## 2019-05-26 DIAGNOSIS — E876 Hypokalemia: Secondary | ICD-10-CM | POA: Diagnosis not present

## 2019-05-26 DIAGNOSIS — N186 End stage renal disease: Secondary | ICD-10-CM | POA: Diagnosis not present

## 2019-05-26 DIAGNOSIS — N2581 Secondary hyperparathyroidism of renal origin: Secondary | ICD-10-CM | POA: Diagnosis not present

## 2019-05-28 DIAGNOSIS — N186 End stage renal disease: Secondary | ICD-10-CM | POA: Diagnosis not present

## 2019-05-28 DIAGNOSIS — N2581 Secondary hyperparathyroidism of renal origin: Secondary | ICD-10-CM | POA: Diagnosis not present

## 2019-05-28 DIAGNOSIS — E1129 Type 2 diabetes mellitus with other diabetic kidney complication: Secondary | ICD-10-CM | POA: Diagnosis not present

## 2019-05-28 DIAGNOSIS — E876 Hypokalemia: Secondary | ICD-10-CM | POA: Diagnosis not present

## 2019-05-28 DIAGNOSIS — Z992 Dependence on renal dialysis: Secondary | ICD-10-CM | POA: Diagnosis not present

## 2019-05-28 DIAGNOSIS — D509 Iron deficiency anemia, unspecified: Secondary | ICD-10-CM | POA: Diagnosis not present

## 2019-05-30 DIAGNOSIS — Z992 Dependence on renal dialysis: Secondary | ICD-10-CM | POA: Diagnosis not present

## 2019-05-30 DIAGNOSIS — E1129 Type 2 diabetes mellitus with other diabetic kidney complication: Secondary | ICD-10-CM | POA: Diagnosis not present

## 2019-05-30 DIAGNOSIS — N2581 Secondary hyperparathyroidism of renal origin: Secondary | ICD-10-CM | POA: Diagnosis not present

## 2019-05-30 DIAGNOSIS — N186 End stage renal disease: Secondary | ICD-10-CM | POA: Diagnosis not present

## 2019-05-30 DIAGNOSIS — D509 Iron deficiency anemia, unspecified: Secondary | ICD-10-CM | POA: Diagnosis not present

## 2019-05-30 DIAGNOSIS — E1122 Type 2 diabetes mellitus with diabetic chronic kidney disease: Secondary | ICD-10-CM | POA: Diagnosis not present

## 2019-05-30 DIAGNOSIS — D631 Anemia in chronic kidney disease: Secondary | ICD-10-CM | POA: Diagnosis not present

## 2019-05-30 DIAGNOSIS — Z23 Encounter for immunization: Secondary | ICD-10-CM | POA: Diagnosis not present

## 2019-06-02 DIAGNOSIS — D509 Iron deficiency anemia, unspecified: Secondary | ICD-10-CM | POA: Diagnosis not present

## 2019-06-02 DIAGNOSIS — N186 End stage renal disease: Secondary | ICD-10-CM | POA: Diagnosis not present

## 2019-06-02 DIAGNOSIS — Z992 Dependence on renal dialysis: Secondary | ICD-10-CM | POA: Diagnosis not present

## 2019-06-02 DIAGNOSIS — E1129 Type 2 diabetes mellitus with other diabetic kidney complication: Secondary | ICD-10-CM | POA: Diagnosis not present

## 2019-06-02 DIAGNOSIS — Z23 Encounter for immunization: Secondary | ICD-10-CM | POA: Diagnosis not present

## 2019-06-02 DIAGNOSIS — N2581 Secondary hyperparathyroidism of renal origin: Secondary | ICD-10-CM | POA: Diagnosis not present

## 2019-06-04 DIAGNOSIS — N186 End stage renal disease: Secondary | ICD-10-CM | POA: Diagnosis not present

## 2019-06-04 DIAGNOSIS — Z992 Dependence on renal dialysis: Secondary | ICD-10-CM | POA: Diagnosis not present

## 2019-06-04 DIAGNOSIS — D509 Iron deficiency anemia, unspecified: Secondary | ICD-10-CM | POA: Diagnosis not present

## 2019-06-04 DIAGNOSIS — N2581 Secondary hyperparathyroidism of renal origin: Secondary | ICD-10-CM | POA: Diagnosis not present

## 2019-06-04 DIAGNOSIS — E1129 Type 2 diabetes mellitus with other diabetic kidney complication: Secondary | ICD-10-CM | POA: Diagnosis not present

## 2019-06-04 DIAGNOSIS — Z23 Encounter for immunization: Secondary | ICD-10-CM | POA: Diagnosis not present

## 2019-06-06 DIAGNOSIS — N2581 Secondary hyperparathyroidism of renal origin: Secondary | ICD-10-CM | POA: Diagnosis not present

## 2019-06-06 DIAGNOSIS — Z23 Encounter for immunization: Secondary | ICD-10-CM | POA: Diagnosis not present

## 2019-06-06 DIAGNOSIS — D509 Iron deficiency anemia, unspecified: Secondary | ICD-10-CM | POA: Diagnosis not present

## 2019-06-06 DIAGNOSIS — E1129 Type 2 diabetes mellitus with other diabetic kidney complication: Secondary | ICD-10-CM | POA: Diagnosis not present

## 2019-06-06 DIAGNOSIS — Z992 Dependence on renal dialysis: Secondary | ICD-10-CM | POA: Diagnosis not present

## 2019-06-06 DIAGNOSIS — N186 End stage renal disease: Secondary | ICD-10-CM | POA: Diagnosis not present

## 2019-06-09 DIAGNOSIS — E1129 Type 2 diabetes mellitus with other diabetic kidney complication: Secondary | ICD-10-CM | POA: Diagnosis not present

## 2019-06-09 DIAGNOSIS — D509 Iron deficiency anemia, unspecified: Secondary | ICD-10-CM | POA: Diagnosis not present

## 2019-06-09 DIAGNOSIS — N2581 Secondary hyperparathyroidism of renal origin: Secondary | ICD-10-CM | POA: Diagnosis not present

## 2019-06-09 DIAGNOSIS — Z23 Encounter for immunization: Secondary | ICD-10-CM | POA: Diagnosis not present

## 2019-06-09 DIAGNOSIS — Z992 Dependence on renal dialysis: Secondary | ICD-10-CM | POA: Diagnosis not present

## 2019-06-09 DIAGNOSIS — N186 End stage renal disease: Secondary | ICD-10-CM | POA: Diagnosis not present

## 2019-06-11 DIAGNOSIS — N2581 Secondary hyperparathyroidism of renal origin: Secondary | ICD-10-CM | POA: Diagnosis not present

## 2019-06-11 DIAGNOSIS — E1129 Type 2 diabetes mellitus with other diabetic kidney complication: Secondary | ICD-10-CM | POA: Diagnosis not present

## 2019-06-11 DIAGNOSIS — N186 End stage renal disease: Secondary | ICD-10-CM | POA: Diagnosis not present

## 2019-06-11 DIAGNOSIS — Z992 Dependence on renal dialysis: Secondary | ICD-10-CM | POA: Diagnosis not present

## 2019-06-11 DIAGNOSIS — D509 Iron deficiency anemia, unspecified: Secondary | ICD-10-CM | POA: Diagnosis not present

## 2019-06-11 DIAGNOSIS — Z23 Encounter for immunization: Secondary | ICD-10-CM | POA: Diagnosis not present

## 2019-06-12 ENCOUNTER — Other Ambulatory Visit: Payer: Self-pay | Admitting: Endocrinology

## 2019-06-13 DIAGNOSIS — N2581 Secondary hyperparathyroidism of renal origin: Secondary | ICD-10-CM | POA: Diagnosis not present

## 2019-06-13 DIAGNOSIS — N186 End stage renal disease: Secondary | ICD-10-CM | POA: Diagnosis not present

## 2019-06-13 DIAGNOSIS — D509 Iron deficiency anemia, unspecified: Secondary | ICD-10-CM | POA: Diagnosis not present

## 2019-06-13 DIAGNOSIS — Z992 Dependence on renal dialysis: Secondary | ICD-10-CM | POA: Diagnosis not present

## 2019-06-13 DIAGNOSIS — E1129 Type 2 diabetes mellitus with other diabetic kidney complication: Secondary | ICD-10-CM | POA: Diagnosis not present

## 2019-06-13 DIAGNOSIS — Z23 Encounter for immunization: Secondary | ICD-10-CM | POA: Diagnosis not present

## 2019-06-16 DIAGNOSIS — D509 Iron deficiency anemia, unspecified: Secondary | ICD-10-CM | POA: Diagnosis not present

## 2019-06-16 DIAGNOSIS — Z992 Dependence on renal dialysis: Secondary | ICD-10-CM | POA: Diagnosis not present

## 2019-06-16 DIAGNOSIS — Z23 Encounter for immunization: Secondary | ICD-10-CM | POA: Diagnosis not present

## 2019-06-16 DIAGNOSIS — N2581 Secondary hyperparathyroidism of renal origin: Secondary | ICD-10-CM | POA: Diagnosis not present

## 2019-06-16 DIAGNOSIS — E1129 Type 2 diabetes mellitus with other diabetic kidney complication: Secondary | ICD-10-CM | POA: Diagnosis not present

## 2019-06-16 DIAGNOSIS — N186 End stage renal disease: Secondary | ICD-10-CM | POA: Diagnosis not present

## 2019-06-18 DIAGNOSIS — Z992 Dependence on renal dialysis: Secondary | ICD-10-CM | POA: Diagnosis not present

## 2019-06-18 DIAGNOSIS — Z23 Encounter for immunization: Secondary | ICD-10-CM | POA: Diagnosis not present

## 2019-06-18 DIAGNOSIS — E1129 Type 2 diabetes mellitus with other diabetic kidney complication: Secondary | ICD-10-CM | POA: Diagnosis not present

## 2019-06-18 DIAGNOSIS — N186 End stage renal disease: Secondary | ICD-10-CM | POA: Diagnosis not present

## 2019-06-18 DIAGNOSIS — D509 Iron deficiency anemia, unspecified: Secondary | ICD-10-CM | POA: Diagnosis not present

## 2019-06-18 DIAGNOSIS — N2581 Secondary hyperparathyroidism of renal origin: Secondary | ICD-10-CM | POA: Diagnosis not present

## 2019-06-20 DIAGNOSIS — N186 End stage renal disease: Secondary | ICD-10-CM | POA: Diagnosis not present

## 2019-06-20 DIAGNOSIS — N2581 Secondary hyperparathyroidism of renal origin: Secondary | ICD-10-CM | POA: Diagnosis not present

## 2019-06-20 DIAGNOSIS — D509 Iron deficiency anemia, unspecified: Secondary | ICD-10-CM | POA: Diagnosis not present

## 2019-06-20 DIAGNOSIS — Z992 Dependence on renal dialysis: Secondary | ICD-10-CM | POA: Diagnosis not present

## 2019-06-20 DIAGNOSIS — E1129 Type 2 diabetes mellitus with other diabetic kidney complication: Secondary | ICD-10-CM | POA: Diagnosis not present

## 2019-06-20 DIAGNOSIS — Z23 Encounter for immunization: Secondary | ICD-10-CM | POA: Diagnosis not present

## 2019-06-22 ENCOUNTER — Other Ambulatory Visit: Payer: Self-pay

## 2019-06-23 DIAGNOSIS — N186 End stage renal disease: Secondary | ICD-10-CM | POA: Diagnosis not present

## 2019-06-23 DIAGNOSIS — E1129 Type 2 diabetes mellitus with other diabetic kidney complication: Secondary | ICD-10-CM | POA: Diagnosis not present

## 2019-06-23 DIAGNOSIS — Z992 Dependence on renal dialysis: Secondary | ICD-10-CM | POA: Diagnosis not present

## 2019-06-23 DIAGNOSIS — N2581 Secondary hyperparathyroidism of renal origin: Secondary | ICD-10-CM | POA: Diagnosis not present

## 2019-06-23 DIAGNOSIS — D509 Iron deficiency anemia, unspecified: Secondary | ICD-10-CM | POA: Diagnosis not present

## 2019-06-23 DIAGNOSIS — Z23 Encounter for immunization: Secondary | ICD-10-CM | POA: Diagnosis not present

## 2019-06-24 ENCOUNTER — Encounter: Payer: Self-pay | Admitting: Endocrinology

## 2019-06-24 ENCOUNTER — Other Ambulatory Visit: Payer: Self-pay

## 2019-06-24 ENCOUNTER — Ambulatory Visit (INDEPENDENT_AMBULATORY_CARE_PROVIDER_SITE_OTHER): Payer: Medicare Other | Admitting: Endocrinology

## 2019-06-24 VITALS — BP 122/60 | HR 101 | Ht 63.0 in | Wt 141.0 lb

## 2019-06-24 DIAGNOSIS — E1165 Type 2 diabetes mellitus with hyperglycemia: Secondary | ICD-10-CM

## 2019-06-24 DIAGNOSIS — Z794 Long term (current) use of insulin: Secondary | ICD-10-CM | POA: Diagnosis not present

## 2019-06-24 DIAGNOSIS — I10 Essential (primary) hypertension: Secondary | ICD-10-CM

## 2019-06-24 DIAGNOSIS — E559 Vitamin D deficiency, unspecified: Secondary | ICD-10-CM

## 2019-06-24 DIAGNOSIS — I70212 Atherosclerosis of native arteries of extremities with intermittent claudication, left leg: Secondary | ICD-10-CM | POA: Diagnosis not present

## 2019-06-24 LAB — POCT GLYCOSYLATED HEMOGLOBIN (HGB A1C): Hemoglobin A1C: 6.1 % — AB (ref 4.0–5.6)

## 2019-06-24 NOTE — Patient Instructions (Addendum)
Check blood sugars on waking up days a week  Also check blood sugars about 2 hours after meals and do this after different meals by rotation  Recommended blood sugar levels on waking up are 90-130 and about 2 hours after meal is 130-160  Please bring your blood sugar monitor to each visit, thank you  Stop Vitamin D

## 2019-06-24 NOTE — Progress Notes (Signed)
Patient ID: Julie Brewer, female   DOB: 1950-08-27, 69 y.o.   MRN: [id number]   Reason for Appointment: Follow-up of various problems  History of Present Illness    Diagnosis: Type 2 DIABETES MELITUS, date of diagnosis:  1985  Prior history: She has been on insulin since diagnosis and on Lantus previously Also at some point had been started on Glucophage several years ago also Because of insurance preference Lantus was changed to Levemir  She refuses to use analog rapid acting insulin because of cost and is using regular insulin for several years   Her blood sugars are generally well controlled and A1c usually under 7% Her A1c previously was higher with stopping metformin at 8%  Recent history:   Insulin regimen: Lantus insulin 14U in the morning daily.  Regular insulin 30 minutes Before eating, 10 units a.m. and 5-7 ac supper  Oral hypoglycemic drugs: None   Current blood sugar patterns, management and problems:  Her A1c is now 6.1, previously reported to be 6.5 at the dialysis center in March    She is only checking some blood sugars in the mornings and not clear how often, did not bring her meter at this time  She forgets to check readings after meals  Her blood sugars in the mornings are in the low 100 range and no low blood sugars reported  As before she usually does not have symptoms of hypoglycemia  She does try to adjust her evening regular insulin dose based on how many carbohydrates she is getting  She thinks her sugars slightly higher this morning from eating sweet potato last night  Otherwise diet is usually not excessive in carbohydrate or fat  Lantus was not changed on her last visit and she takes this every morning  No recent labs available for confirmation of her blood sugars  Her weight is about the same; her daughter thinks that her appetite has improved  Side effects from medications: None      Glucometer:  FreeStyle  Blood  sugars by recall:  PRE-MEAL Fasting Lunch Dinner Bedtime Overall  Glucose range:  119-141   ?   Mean/median:          Meals:  usually 2 meals per day at 10 AM and 5 PM.     Mealtime protein sources:turkey, chicken.  Eating cereal for breakfast periodically Avoiding sweet drinks  Physical activity: exercise: Some walking within the house              Wt Readings from Last 3 Encounters:  06/24/19 141 lb (64 kg)  04/27/19 139 lb (63 kg)  04/24/19 139 lb 3.2 oz (63.1 kg)   Lab Results  Component Value Date   HGBA1C 6.1 (A) 06/24/2019   HGBA1C 7.1 (H) 02/07/2019   HGBA1C 7.6 (H) 11/04/2018   Lab Results  Component Value Date   MICROALBUR 196.7 (H) 03/11/2018   LDLCALC 70 04/24/2019   CREATININE 3.10 (H) 04/27/2019      Lab Results  Component Value Date   FRUCTOSAMINE 218 03/11/2018   FRUCTOSAMINE 229 09/13/2017    Other active problems: See review of systems      Allergies as of 06/24/2019      Reactions   Morphine And Related Other (See Comments)   Hallucinations    Penicillins Swelling, Rash   Throat swelling Did it involve swelling of the face/tongue/throat, SOB, or low BP? Yes Did it involve sudden or severe rash/hives, skin peeling, or any  reaction on the inside of your mouth or nose? Yes Did you need to seek medical attention at a hospital or doctor's office? Yes When did it last happen?young child If all above answers are "NO", may proceed with cephalosporin use.      Medication List       Accurate as of Jun 24, 2019 11:10 AM. If you have any questions, ask your nurse or doctor.        acetaminophen 325 MG tablet Commonly known as: TYLENOL Take 2 tablets (650 mg total) by mouth every 6 (six) hours as needed for mild pain (or Fever >/= 101).   aspirin 325 MG EC tablet Take 325 mg by mouth daily.   atorvastatin 20 MG tablet Commonly known as: LIPITOR Take 1 tablet by mouth once daily   bisacodyl 5 MG EC tablet Commonly known as:  DULCOLAX Take 5 mg by mouth daily as needed for moderate constipation.   cilostazol 100 MG tablet Commonly known as: PLETAL Take 1 tablet by mouth twice daily   docusate sodium 100 MG capsule Commonly known as: COLACE Take 200 mg by mouth daily.   ferrous sulfate 325 (65 FE) MG tablet Take 650 mg by mouth daily.   FREESTYLE LITE test strip Generic drug: glucose blood USE AS INSTRUCTED TO CHECK BLOOD SUGAR ONCE DAILY.   gabapentin 100 MG capsule Commonly known as: NEURONTIN Take 1 capsule twice daily as needed.   Lantus 100 UNIT/ML injection Generic drug: insulin glargine Inject 14 Units into the skin daily. Inject 14 units under the skin once daily.   NovoLIN R 100 units/mL injection Generic drug: insulin regular Inject into the skin 2 (two) times daily before a meal. Inject 10 units under the skin before breakfast and 5 units before dinner.   omeprazole 20 MG capsule Commonly known as: PRILOSEC TAKE 1 CAPSULE BY MOUTH ONCE DAILY ( DUE FOR OFFICE VISIT THIS MONTH)   oxyCODONE 5 MG immediate release tablet Commonly known as: Roxicodone Take 1 tablet (5 mg total) by mouth every 4 (four) hours as needed.   Vitamin D (Ergocalciferol) 1.25 MG (50000 UNIT) Caps capsule Commonly known as: DRISDOL Take 1 capsule by mouth once a week       Allergies:  Allergies  Allergen Reactions  . Morphine And Related Other (See Comments)    Hallucinations   . Penicillins Swelling and Rash    Throat swelling Did it involve swelling of the face/tongue/throat, SOB, or low BP? Yes Did it involve sudden or severe rash/hives, skin peeling, or any reaction on the inside of your mouth or nose? Yes Did you need to seek medical attention at a hospital or doctor's office? Yes When did it last happen?young child If all above answers are "NO", may proceed with cephalosporin use.    Past Medical History:  Diagnosis Date  . Asthma    No probnlems recently  . 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 CHF (congestive heart failure) ([place])   . Chronic kidney disease    Tu/Th/Sa  . Diabetes mellitus    Type II per Dr [name]  (patient said type I)  . Fibroid   . GERD (gastroesophageal reflux disease)   . Glaucoma   . Hyperlipidemia   . Hypertension   . Iron deficiency anemia 03/09/2016  . Peripheral vascular disease ([place])   . Pneumonia 2006  . PONV (postoperative nausea and vomiting)   .  Shortness of breath dyspnea    with exdrtion, "Walkling too fast"  . Stroke [facility])    no residual    Past Surgical History:  Procedure Laterality Date  . ABDOMINAL HYSTERECTOMY    . AV FISTULA PLACEMENT Left 04/27/2019   Procedure: INSERTION OF ARTERIOVENOUS (AV) GORE-TEX GRAFT LEFT UPPER ARM;  Surgeon: Angelia Mould, MD;  Location: [place];  Service: Vascular;  Laterality: Left;  . BIOPSY  06/10/2017   Procedure: BIOPSY;  Surgeon: Ronnette Juniper, MD;  Location: [place];  Service: Gastroenterology;;  . CERVICAL FUSION     with graft from hip  . COLONOSCOPY  July 09, 2012  . DIRECT LARYNGOSCOPY N/A 06/07/2014   Procedure: DIRECT LARYNGOSCOPY with BIOPSY and EXCISION VOLLECULAR CYST;  Surgeon: Ruby Cola, MD;  Location[facility] OR;  Service: ENT;  Laterality: N/A;  . ESOPHAGOGASTRODUODENOSCOPY (EGD) WITH PROPOFOL Left 06/10/2017   Procedure: ESOPHAGOGASTRODUODENOSCOPY (EGD) WITH PROPOFOL;  Surgeon: Ronnette Juniper, MD;  Location: [place];  Service: Gastroenterology;  Laterality: Left;  . FLEXIBLE SIGMOIDOSCOPY Left 06/10/2017   Procedure: FLEXIBLE SIGMOIDOSCOPY;  Surgeon: Ronnette Juniper, MD;  Location: [place];  Service: Gastroenterology;  Laterality: Left;  . LOOP RECORDER INSERTION N/A 06/20/2016   Procedure: Loop Recorder Insertion;  Surgeon: [name], MD;  Location: [place] [facility];  Service: Cardiovascular;  Laterality: N/A;  . REFRACTIVE SURGERY Bilateral    both eyes  . SPINE SURGERY     lumbar    Family History   Problem Relation Age of Onset  . Cancer Mother   . Heart disease Mother   . Diabetes Father   . Cancer Brother   . Cancer Brother   . Throat cancer Brother     Social History:  reports that she has been smoking cigarettes. She has a 30.00 pack-year smoking history. She has never used smokeless tobacco. She reports previous alcohol use. She reports previous drug use. Drug: Methylphenidate.  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     HYPERLIPIDEMIA: The lipid abnormality consists of elevated LDL controlled with Lipitor 20 mg .   Her baseline LDL was 202  She is taking this regularly Recent ALT was also normal  Lab Results  Component Value Date   CHOL 137 04/24/2019   HDL 44.00 04/24/2019   LDLCALC 70 04/24/2019   TRIG 115.0 04/24/2019   CHOLHDL 3 04/24/2019    Vitamin D: Taking 50,000 unit dosage once a month This previously had been monitored by PCP However now she is on calcitriol from the nephrologist   Lab Results  Component Value Date   VD25OH 40.22 09/13/2016       Last diabetic foot exam was done in 7/20  Has been taking gabapentin 100mg  twice daily for lower leg leg pains long-term with relief  She has had her Covid vaccines   Examination:   BP 122/60 (BP Location: Right Arm, Patient Position: Sitting, Cuff Size: Normal)   Pulse Marland Kitchen)  101   Ht 5\' 3"  (1.6 m)   Wt 141 lb (64 kg)   SpO2 96%   BMI 24.98 kg/m   Body mass index is 24.98 kg/m.     ASSESSMENT/  PLAN:     Diabetes type 2 on insulin See history of present illness for detailed discussion of current diabetes management, blood sugar patterns and problems identified  Her A1c is excellent at 6.1  A1c may not be accurate because of her anemia and renal failure  Blood sugars are reportedly fairly good at home fasting and still is on relatively low doses of 14 units Lantus once a day in the morning She is also regular with taking her mealtime insulin 30 units before meals, cannot afford brand-name analog insulins as before  Although A1c is quite good discussed that we need to make sure her sugars are neither too high nor too low after either her breakfast or dinner; only eating 2 meals a day usually  Not clear how often she is monitoring as she is checking twice a day and relying on her daughter to check her sugars as before  No change in diet and her appetite is normal, she understands the need to increase suppertime dose when she is eating low carbohydrate  We will also check fructosamine to correlate with her A1c on the next visit Lantus and regular insulin will be continued unchanged   HYPERLIPIDEMIA: Has been on 20 mg Lipitor with good control  Hypertension followed by nephrologist   History of vitamin D deficiency: Since she has significant CKD and is on calcitriol can stop her vitamin D supplement now    There are no Patient Instructions on file for this visit.  Elayne Snare 06/24/2019, 11:10 AM     Note: This office note was prepared with Dragon voice recognition system technology. Any transcriptional errors that result from this process are unintentional.

## 2019-06-25 ENCOUNTER — Encounter (HOSPITAL_COMMUNITY): Payer: Self-pay

## 2019-06-25 ENCOUNTER — Emergency Department (HOSPITAL_COMMUNITY)
Admission: EM | Admit: 2019-06-25 | Discharge: 2019-06-25 | Discharge status: Against Medical Advice | Payer: Medicare Other | Attending: Emergency Medicine | Admitting: Emergency Medicine

## 2019-06-25 ENCOUNTER — Other Ambulatory Visit: Payer: Self-pay

## 2019-06-25 DIAGNOSIS — N2581 Secondary hyperparathyroidism of renal origin: Secondary | ICD-10-CM | POA: Diagnosis not present

## 2019-06-25 DIAGNOSIS — R29810 Facial weakness: Secondary | ICD-10-CM | POA: Diagnosis not present

## 2019-06-25 DIAGNOSIS — Z23 Encounter for immunization: Secondary | ICD-10-CM | POA: Diagnosis not present

## 2019-06-25 DIAGNOSIS — Z5321 Procedure and treatment not carried out due to patient leaving prior to being seen by health care provider: Secondary | ICD-10-CM | POA: Insufficient documentation

## 2019-06-25 DIAGNOSIS — D509 Iron deficiency anemia, unspecified: Secondary | ICD-10-CM | POA: Diagnosis not present

## 2019-06-25 DIAGNOSIS — E1129 Type 2 diabetes mellitus with other diabetic kidney complication: Secondary | ICD-10-CM | POA: Diagnosis not present

## 2019-06-25 DIAGNOSIS — Z992 Dependence on renal dialysis: Secondary | ICD-10-CM | POA: Diagnosis not present

## 2019-06-25 DIAGNOSIS — R531 Weakness: Secondary | ICD-10-CM | POA: Diagnosis not present

## 2019-06-25 DIAGNOSIS — I959 Hypotension, unspecified: Secondary | ICD-10-CM | POA: Diagnosis not present

## 2019-06-25 DIAGNOSIS — W19XXXA Unspecified fall, initial encounter: Secondary | ICD-10-CM | POA: Diagnosis not present

## 2019-06-25 DIAGNOSIS — R55 Syncope and collapse: Secondary | ICD-10-CM | POA: Diagnosis not present

## 2019-06-25 DIAGNOSIS — N186 End stage renal disease: Secondary | ICD-10-CM | POA: Diagnosis not present

## 2019-06-25 LAB — BASIC METABOLIC PANEL
Anion gap: 15 (ref 5–15)
BUN: 16 mg/dL (ref 8–23)
CO2: 25 mmol/L (ref 22–32)
Calcium: 8.1 mg/dL — ABNORMAL LOW (ref 8.9–10.3)
Chloride: 95 mmol/L — ABNORMAL LOW (ref 98–111)
Creatinine, Ser: 2.44 mg/dL — ABNORMAL HIGH (ref 0.44–1.00)
GFR calc Af Amer: 23 mL/min — ABNORMAL LOW (ref >60)
GFR calc non Af Amer: 20 mL/min — ABNORMAL LOW (ref >60)
Glucose, Bld: 270 mg/dL — ABNORMAL HIGH (ref 70–99)
Potassium: 3.3 mmol/L — ABNORMAL LOW (ref 3.5–5.1)
Sodium: 135 mmol/L (ref 135–145)

## 2019-06-25 LAB — CBC
HCT: 35.7 % — ABNORMAL LOW (ref 36.0–46.0)
Hemoglobin: 11 g/dL — ABNORMAL LOW (ref 12.0–15.0)
MCH: 25.2 pg — ABNORMAL LOW (ref 26.0–34.0)
MCHC: 30.8 g/dL (ref 30.0–36.0)
MCV: 81.7 fL (ref 80.0–100.0)
Platelets: 231 10*3/uL (ref 150–400)
RBC: 4.37 MIL/uL (ref 3.87–5.11)
RDW: 17.4 % — ABNORMAL HIGH (ref 11.5–15.5)
WBC: 11.9 10*3/uL — ABNORMAL HIGH (ref 4.0–10.5)
nRBC: 0 % (ref 0.0–0.2)

## 2019-06-25 MED ORDER — SODIUM CHLORIDE 0.9% FLUSH: 3.0000 mL | Freq: Once | INTRAVENOUS | Status: DC

## 2019-06-25 NOTE — ED Notes (Signed)
Pt feels better and wants to leave.  Assisted pt to call daughter.  Assisted pt to car once daughter arrived.

## 2019-06-25 NOTE — ED Triage Notes (Signed)
Per [facility] pt from home with a syncopal episode, fell down her stairs after arriving home from dialysis, three brick stairs, +LOC, abrasion to Right side of head, A/O x4, pt began c/o SOB after arriving to ED    BP 60/30 initially 110/42 after 250 NS bolus HR 80 96% RA CBG 238  22g RFA

## 2019-06-27 DIAGNOSIS — Z992 Dependence on renal dialysis: Secondary | ICD-10-CM | POA: Diagnosis not present

## 2019-06-27 DIAGNOSIS — Z23 Encounter for immunization: Secondary | ICD-10-CM | POA: Diagnosis not present

## 2019-06-27 DIAGNOSIS — N2581 Secondary hyperparathyroidism of renal origin: Secondary | ICD-10-CM | POA: Diagnosis not present

## 2019-06-27 DIAGNOSIS — D509 Iron deficiency anemia, unspecified: Secondary | ICD-10-CM | POA: Diagnosis not present

## 2019-06-27 DIAGNOSIS — N186 End stage renal disease: Secondary | ICD-10-CM | POA: Diagnosis not present

## 2019-06-27 DIAGNOSIS — E1129 Type 2 diabetes mellitus with other diabetic kidney complication: Secondary | ICD-10-CM | POA: Diagnosis not present

## 2019-06-29 ENCOUNTER — Other Ambulatory Visit: Payer: Self-pay | Admitting: Endocrinology

## 2019-06-30 DIAGNOSIS — N186 End stage renal disease: Secondary | ICD-10-CM | POA: Diagnosis not present

## 2019-06-30 DIAGNOSIS — N2581 Secondary hyperparathyroidism of renal origin: Secondary | ICD-10-CM | POA: Diagnosis not present

## 2019-06-30 DIAGNOSIS — E1122 Type 2 diabetes mellitus with diabetic chronic kidney disease: Secondary | ICD-10-CM | POA: Diagnosis not present

## 2019-06-30 DIAGNOSIS — D631 Anemia in chronic kidney disease: Secondary | ICD-10-CM | POA: Diagnosis not present

## 2019-06-30 DIAGNOSIS — Z992 Dependence on renal dialysis: Secondary | ICD-10-CM | POA: Diagnosis not present

## 2019-06-30 DIAGNOSIS — E1129 Type 2 diabetes mellitus with other diabetic kidney complication: Secondary | ICD-10-CM | POA: Diagnosis not present

## 2019-06-30 DIAGNOSIS — D509 Iron deficiency anemia, unspecified: Secondary | ICD-10-CM | POA: Diagnosis not present

## 2019-06-30 NOTE — Telephone Encounter (Signed)
Noted. Will be forwarded.

## 2019-06-30 NOTE — Telephone Encounter (Signed)
Send to Dr. Justin Mend

## 2019-06-30 NOTE — Telephone Encounter (Signed)
Would you like to refill or send to PCP?

## 2019-07-02 ENCOUNTER — Other Ambulatory Visit: Payer: Self-pay | Admitting: Endocrinology

## 2019-07-02 DIAGNOSIS — N2581 Secondary hyperparathyroidism of renal origin: Secondary | ICD-10-CM | POA: Diagnosis not present

## 2019-07-02 DIAGNOSIS — E1129 Type 2 diabetes mellitus with other diabetic kidney complication: Secondary | ICD-10-CM | POA: Diagnosis not present

## 2019-07-02 DIAGNOSIS — D631 Anemia in chronic kidney disease: Secondary | ICD-10-CM | POA: Diagnosis not present

## 2019-07-02 DIAGNOSIS — N186 End stage renal disease: Secondary | ICD-10-CM | POA: Diagnosis not present

## 2019-07-02 DIAGNOSIS — Z992 Dependence on renal dialysis: Secondary | ICD-10-CM | POA: Diagnosis not present

## 2019-07-02 DIAGNOSIS — D509 Iron deficiency anemia, unspecified: Secondary | ICD-10-CM | POA: Diagnosis not present

## 2019-07-07 DIAGNOSIS — N186 End stage renal disease: Secondary | ICD-10-CM | POA: Diagnosis not present

## 2019-07-07 DIAGNOSIS — D631 Anemia in chronic kidney disease: Secondary | ICD-10-CM | POA: Diagnosis not present

## 2019-07-07 DIAGNOSIS — N2581 Secondary hyperparathyroidism of renal origin: Secondary | ICD-10-CM | POA: Diagnosis not present

## 2019-07-07 DIAGNOSIS — D509 Iron deficiency anemia, unspecified: Secondary | ICD-10-CM | POA: Diagnosis not present

## 2019-07-07 DIAGNOSIS — E1129 Type 2 diabetes mellitus with other diabetic kidney complication: Secondary | ICD-10-CM | POA: Diagnosis not present

## 2019-07-07 DIAGNOSIS — Z992 Dependence on renal dialysis: Secondary | ICD-10-CM | POA: Diagnosis not present

## 2019-07-08 ENCOUNTER — Other Ambulatory Visit: Payer: Self-pay

## 2019-07-08 ENCOUNTER — Ambulatory Visit (INDEPENDENT_AMBULATORY_CARE_PROVIDER_SITE_OTHER): Payer: Medicare Other | Admitting: Podiatry

## 2019-07-08 ENCOUNTER — Encounter: Payer: Self-pay | Admitting: Podiatry

## 2019-07-08 DIAGNOSIS — B351 Tinea unguium: Secondary | ICD-10-CM | POA: Diagnosis not present

## 2019-07-08 DIAGNOSIS — E1151 Type 2 diabetes mellitus with diabetic peripheral angiopathy without gangrene: Secondary | ICD-10-CM

## 2019-07-08 DIAGNOSIS — M79674 Pain in right toe(s): Secondary | ICD-10-CM | POA: Diagnosis not present

## 2019-07-08 DIAGNOSIS — M79675 Pain in left toe(s): Secondary | ICD-10-CM

## 2019-07-08 NOTE — Progress Notes (Signed)
  Subjective:  Patient ID: Julie Brewer, female    DOB: 1950/10/19,  MRN: [id number]  Chief Complaint  Patient presents with  . Nail Problem    pt is here for 3 month routine foot care, as well as a possible nail trim   69 y.o. female returns for the above complaint.  Patient is a type II diabetic whose A1c is 7.1.  Patient presents with thickened elongated mycotic toenails x10.  Patient states that she is not able to cut it herself.  She also has secondary complaints of mid 3 calluses bilaterally x2.  She states it causes pain to walk on them.  She has not tried any over-the-counter medication for these calluses.  She would like to know if this could be debrided down as well.  Objective:  There were no vitals filed for this visit. Podiatric Exam: Vascular: dorsalis pedis and posterior tibial pulses are palpable bilateral. Capillary return is immediate. Temperature gradient is WNL. Skin turgor WNL  Sensorium: Normal Semmes Weinstein monofilament test. Normal tactile sensation bilaterally. Nail Exam: Pt has thick disfigured discolored nails with subungual debris noted bilateral entire nail hallux through fifth toenails Ulcer Exam: There is no evidence of ulcer or pre-ulcerative changes or infection. Orthopedic Exam: Muscle tone and strength are WNL. No limitations in general ROM. No crepitus or effusions noted. HAV  B/L.  Hammer toes 2-5  B/L. Skin: No Porokeratosis. No infection or ulcers.  Submet 3 hyperkeratotic lesion/callus x2.  Pain on palpation.  Assessment & Plan:  Patient was evaluated and treated and all questions answered.  Bilateral submet 3 hyperkeratotic lesion/callus x2 -Resolving  Onychomycosis with pain  -Nails palliatively debrided as below. -Educated on self-care  Procedure: Nail Debridement Rationale: pain  Type of Debridement: manual, sharp debridement. Instrumentation: Nail nipper, rotary burr. Number of Nails: 10  Procedures and Treatment: Consent by  patient was obtained for treatment procedures. The patient understood the discussion of treatment and procedures well. All questions were answered thoroughly reviewed. Debridement of mycotic and hypertrophic toenails, 1 through 5 bilateral and clearing of subungual debris. No ulceration, no infection noted.  Return Visit-Office Procedure: Patient instructed to return to the office for a follow up visit 3 months for continued evaluation and treatment.  Boneta Lucks, DPM    No follow-ups on file.

## 2019-07-09 DIAGNOSIS — N186 End stage renal disease: Secondary | ICD-10-CM | POA: Diagnosis not present

## 2019-07-09 DIAGNOSIS — E1129 Type 2 diabetes mellitus with other diabetic kidney complication: Secondary | ICD-10-CM | POA: Diagnosis not present

## 2019-07-09 DIAGNOSIS — D509 Iron deficiency anemia, unspecified: Secondary | ICD-10-CM | POA: Diagnosis not present

## 2019-07-09 DIAGNOSIS — N2581 Secondary hyperparathyroidism of renal origin: Secondary | ICD-10-CM | POA: Diagnosis not present

## 2019-07-09 DIAGNOSIS — Z992 Dependence on renal dialysis: Secondary | ICD-10-CM | POA: Diagnosis not present

## 2019-07-09 DIAGNOSIS — D631 Anemia in chronic kidney disease: Secondary | ICD-10-CM | POA: Diagnosis not present

## 2019-07-10 DIAGNOSIS — N186 End stage renal disease: Secondary | ICD-10-CM | POA: Diagnosis not present

## 2019-07-10 DIAGNOSIS — Z992 Dependence on renal dialysis: Secondary | ICD-10-CM | POA: Diagnosis not present

## 2019-07-10 DIAGNOSIS — T82858A Stenosis of vascular prosthetic devices, implants and grafts, initial encounter: Secondary | ICD-10-CM | POA: Diagnosis not present

## 2019-07-10 DIAGNOSIS — Z452 Encounter for adjustment and management of vascular access device: Secondary | ICD-10-CM | POA: Diagnosis not present

## 2019-07-10 DIAGNOSIS — I871 Compression of vein: Secondary | ICD-10-CM | POA: Diagnosis not present

## 2019-07-11 DIAGNOSIS — E1129 Type 2 diabetes mellitus with other diabetic kidney complication: Secondary | ICD-10-CM | POA: Diagnosis not present

## 2019-07-11 DIAGNOSIS — N2581 Secondary hyperparathyroidism of renal origin: Secondary | ICD-10-CM | POA: Diagnosis not present

## 2019-07-11 DIAGNOSIS — Z992 Dependence on renal dialysis: Secondary | ICD-10-CM | POA: Diagnosis not present

## 2019-07-11 DIAGNOSIS — D509 Iron deficiency anemia, unspecified: Secondary | ICD-10-CM | POA: Diagnosis not present

## 2019-07-11 DIAGNOSIS — D631 Anemia in chronic kidney disease: Secondary | ICD-10-CM | POA: Diagnosis not present

## 2019-07-11 DIAGNOSIS — N186 End stage renal disease: Secondary | ICD-10-CM | POA: Diagnosis not present

## 2019-07-13 ENCOUNTER — Other Ambulatory Visit: Payer: Self-pay

## 2019-07-13 ENCOUNTER — Telehealth: Payer: Self-pay | Admitting: Endocrinology

## 2019-07-13 NOTE — Telephone Encounter (Signed)
Called pt and gave her MD message. Pt verbalized understanding. 

## 2019-07-13 NOTE — Telephone Encounter (Signed)
This is not on med list. Should this be refilled?

## 2019-07-13 NOTE — Telephone Encounter (Signed)
Blood pressure medications should be refilled by her nephrologist now

## 2019-07-13 NOTE — Telephone Encounter (Signed)
Patient's daughter [name] called to requests the following RX be sent asap as patient is leaving town 07/15/19 [name] states she called this request in last week on 07/09/19):  Medication Refill Request  Did you call your pharmacy and request this refill first? Yes-PHARM stated they did not received the Rx  If patient has not contacted pharmacy first, instruct them to do so for future refills.   Remind them that contacting the pharmacy for their refill is the quickest method to get the refill.   Refill policy also stated that it will take anywhere between 24-72 hours to receive the refill.    Name of medication? Amlodipine 5 mg  Is this a 90 day supply? Yes  Name and location of pharmacy?   [facility] ([place]), [place] - [address] Phone:  [phone]  Fax:  [phone]       Is the request for diabetes test strips? No  If yes, what brand? N/A

## 2019-07-14 DIAGNOSIS — N2581 Secondary hyperparathyroidism of renal origin: Secondary | ICD-10-CM | POA: Diagnosis not present

## 2019-07-14 DIAGNOSIS — Z992 Dependence on renal dialysis: Secondary | ICD-10-CM | POA: Diagnosis not present

## 2019-07-14 DIAGNOSIS — D631 Anemia in chronic kidney disease: Secondary | ICD-10-CM | POA: Diagnosis not present

## 2019-07-14 DIAGNOSIS — N186 End stage renal disease: Secondary | ICD-10-CM | POA: Diagnosis not present

## 2019-07-14 DIAGNOSIS — E1129 Type 2 diabetes mellitus with other diabetic kidney complication: Secondary | ICD-10-CM | POA: Diagnosis not present

## 2019-07-14 DIAGNOSIS — D509 Iron deficiency anemia, unspecified: Secondary | ICD-10-CM | POA: Diagnosis not present

## 2019-07-16 DIAGNOSIS — Z992 Dependence on renal dialysis: Secondary | ICD-10-CM | POA: Diagnosis not present

## 2019-07-16 DIAGNOSIS — N186 End stage renal disease: Secondary | ICD-10-CM | POA: Diagnosis not present

## 2019-07-18 DIAGNOSIS — N186 End stage renal disease: Secondary | ICD-10-CM | POA: Diagnosis not present

## 2019-07-18 DIAGNOSIS — Z992 Dependence on renal dialysis: Secondary | ICD-10-CM | POA: Diagnosis not present

## 2019-07-21 DIAGNOSIS — N186 End stage renal disease: Secondary | ICD-10-CM | POA: Diagnosis not present

## 2019-07-21 DIAGNOSIS — Z992 Dependence on renal dialysis: Secondary | ICD-10-CM | POA: Diagnosis not present

## 2019-07-25 DIAGNOSIS — Z992 Dependence on renal dialysis: Secondary | ICD-10-CM | POA: Diagnosis not present

## 2019-07-25 DIAGNOSIS — N186 End stage renal disease: Secondary | ICD-10-CM | POA: Diagnosis not present

## 2019-07-25 DIAGNOSIS — D509 Iron deficiency anemia, unspecified: Secondary | ICD-10-CM | POA: Diagnosis not present

## 2019-07-25 DIAGNOSIS — D631 Anemia in chronic kidney disease: Secondary | ICD-10-CM | POA: Diagnosis not present

## 2019-07-25 DIAGNOSIS — N2581 Secondary hyperparathyroidism of renal origin: Secondary | ICD-10-CM | POA: Diagnosis not present

## 2019-07-25 DIAGNOSIS — E1129 Type 2 diabetes mellitus with other diabetic kidney complication: Secondary | ICD-10-CM | POA: Diagnosis not present

## 2019-07-28 DIAGNOSIS — Z992 Dependence on renal dialysis: Secondary | ICD-10-CM | POA: Diagnosis not present

## 2019-07-28 DIAGNOSIS — D509 Iron deficiency anemia, unspecified: Secondary | ICD-10-CM | POA: Diagnosis not present

## 2019-07-28 DIAGNOSIS — D631 Anemia in chronic kidney disease: Secondary | ICD-10-CM | POA: Diagnosis not present

## 2019-07-28 DIAGNOSIS — E1129 Type 2 diabetes mellitus with other diabetic kidney complication: Secondary | ICD-10-CM | POA: Diagnosis not present

## 2019-07-28 DIAGNOSIS — N2581 Secondary hyperparathyroidism of renal origin: Secondary | ICD-10-CM | POA: Diagnosis not present

## 2019-07-28 DIAGNOSIS — N186 End stage renal disease: Secondary | ICD-10-CM | POA: Diagnosis not present

## 2019-07-30 DIAGNOSIS — N2581 Secondary hyperparathyroidism of renal origin: Secondary | ICD-10-CM | POA: Diagnosis not present

## 2019-07-30 DIAGNOSIS — D631 Anemia in chronic kidney disease: Secondary | ICD-10-CM | POA: Diagnosis not present

## 2019-07-30 DIAGNOSIS — Z992 Dependence on renal dialysis: Secondary | ICD-10-CM | POA: Diagnosis not present

## 2019-07-30 DIAGNOSIS — N186 End stage renal disease: Secondary | ICD-10-CM | POA: Diagnosis not present

## 2019-07-30 DIAGNOSIS — E1129 Type 2 diabetes mellitus with other diabetic kidney complication: Secondary | ICD-10-CM | POA: Diagnosis not present

## 2019-07-30 DIAGNOSIS — E1122 Type 2 diabetes mellitus with diabetic chronic kidney disease: Secondary | ICD-10-CM | POA: Diagnosis not present

## 2019-07-30 DIAGNOSIS — D509 Iron deficiency anemia, unspecified: Secondary | ICD-10-CM | POA: Diagnosis not present

## 2019-07-31 ENCOUNTER — Other Ambulatory Visit: Payer: Self-pay | Admitting: *Deleted

## 2019-07-31 MED ORDER — CILOSTAZOL 100 MG PO TABS: 100.0000 mg | ORAL_TABLET | Freq: Two times a day (BID) | ORAL | 1 refills | Status: DC

## 2019-07-31 NOTE — Progress Notes (Signed)
Opened in error

## 2019-08-01 DIAGNOSIS — D509 Iron deficiency anemia, unspecified: Secondary | ICD-10-CM | POA: Diagnosis not present

## 2019-08-01 DIAGNOSIS — N186 End stage renal disease: Secondary | ICD-10-CM | POA: Diagnosis not present

## 2019-08-01 DIAGNOSIS — Z992 Dependence on renal dialysis: Secondary | ICD-10-CM | POA: Diagnosis not present

## 2019-08-01 DIAGNOSIS — E1129 Type 2 diabetes mellitus with other diabetic kidney complication: Secondary | ICD-10-CM | POA: Diagnosis not present

## 2019-08-01 DIAGNOSIS — D631 Anemia in chronic kidney disease: Secondary | ICD-10-CM | POA: Diagnosis not present

## 2019-08-01 DIAGNOSIS — N2581 Secondary hyperparathyroidism of renal origin: Secondary | ICD-10-CM | POA: Diagnosis not present

## 2019-08-04 DIAGNOSIS — N2581 Secondary hyperparathyroidism of renal origin: Secondary | ICD-10-CM | POA: Diagnosis not present

## 2019-08-04 DIAGNOSIS — D509 Iron deficiency anemia, unspecified: Secondary | ICD-10-CM | POA: Diagnosis not present

## 2019-08-04 DIAGNOSIS — D631 Anemia in chronic kidney disease: Secondary | ICD-10-CM | POA: Diagnosis not present

## 2019-08-04 DIAGNOSIS — Z992 Dependence on renal dialysis: Secondary | ICD-10-CM | POA: Diagnosis not present

## 2019-08-04 DIAGNOSIS — E1129 Type 2 diabetes mellitus with other diabetic kidney complication: Secondary | ICD-10-CM | POA: Diagnosis not present

## 2019-08-04 DIAGNOSIS — N186 End stage renal disease: Secondary | ICD-10-CM | POA: Diagnosis not present

## 2019-08-06 DIAGNOSIS — E1129 Type 2 diabetes mellitus with other diabetic kidney complication: Secondary | ICD-10-CM | POA: Diagnosis not present

## 2019-08-06 DIAGNOSIS — D631 Anemia in chronic kidney disease: Secondary | ICD-10-CM | POA: Diagnosis not present

## 2019-08-06 DIAGNOSIS — N186 End stage renal disease: Secondary | ICD-10-CM | POA: Diagnosis not present

## 2019-08-06 DIAGNOSIS — Z992 Dependence on renal dialysis: Secondary | ICD-10-CM | POA: Diagnosis not present

## 2019-08-06 DIAGNOSIS — N2581 Secondary hyperparathyroidism of renal origin: Secondary | ICD-10-CM | POA: Diagnosis not present

## 2019-08-06 DIAGNOSIS — D509 Iron deficiency anemia, unspecified: Secondary | ICD-10-CM | POA: Diagnosis not present

## 2019-08-07 ENCOUNTER — Other Ambulatory Visit: Payer: Self-pay | Admitting: Endocrinology

## 2019-08-08 DIAGNOSIS — D631 Anemia in chronic kidney disease: Secondary | ICD-10-CM | POA: Diagnosis not present

## 2019-08-08 DIAGNOSIS — N186 End stage renal disease: Secondary | ICD-10-CM | POA: Diagnosis not present

## 2019-08-08 DIAGNOSIS — N2581 Secondary hyperparathyroidism of renal origin: Secondary | ICD-10-CM | POA: Diagnosis not present

## 2019-08-08 DIAGNOSIS — E1129 Type 2 diabetes mellitus with other diabetic kidney complication: Secondary | ICD-10-CM | POA: Diagnosis not present

## 2019-08-08 DIAGNOSIS — D509 Iron deficiency anemia, unspecified: Secondary | ICD-10-CM | POA: Diagnosis not present

## 2019-08-08 DIAGNOSIS — Z992 Dependence on renal dialysis: Secondary | ICD-10-CM | POA: Diagnosis not present

## 2019-08-11 DIAGNOSIS — E1129 Type 2 diabetes mellitus with other diabetic kidney complication: Secondary | ICD-10-CM | POA: Diagnosis not present

## 2019-08-11 DIAGNOSIS — N186 End stage renal disease: Secondary | ICD-10-CM | POA: Diagnosis not present

## 2019-08-11 DIAGNOSIS — D509 Iron deficiency anemia, unspecified: Secondary | ICD-10-CM | POA: Diagnosis not present

## 2019-08-11 DIAGNOSIS — N2581 Secondary hyperparathyroidism of renal origin: Secondary | ICD-10-CM | POA: Diagnosis not present

## 2019-08-11 DIAGNOSIS — Z992 Dependence on renal dialysis: Secondary | ICD-10-CM | POA: Diagnosis not present

## 2019-08-11 DIAGNOSIS — D631 Anemia in chronic kidney disease: Secondary | ICD-10-CM | POA: Diagnosis not present

## 2019-08-13 DIAGNOSIS — N2581 Secondary hyperparathyroidism of renal origin: Secondary | ICD-10-CM | POA: Diagnosis not present

## 2019-08-13 DIAGNOSIS — D631 Anemia in chronic kidney disease: Secondary | ICD-10-CM | POA: Diagnosis not present

## 2019-08-13 DIAGNOSIS — Z992 Dependence on renal dialysis: Secondary | ICD-10-CM | POA: Diagnosis not present

## 2019-08-13 DIAGNOSIS — D509 Iron deficiency anemia, unspecified: Secondary | ICD-10-CM | POA: Diagnosis not present

## 2019-08-13 DIAGNOSIS — E1129 Type 2 diabetes mellitus with other diabetic kidney complication: Secondary | ICD-10-CM | POA: Diagnosis not present

## 2019-08-13 DIAGNOSIS — N186 End stage renal disease: Secondary | ICD-10-CM | POA: Diagnosis not present

## 2019-08-15 DIAGNOSIS — D631 Anemia in chronic kidney disease: Secondary | ICD-10-CM | POA: Diagnosis not present

## 2019-08-15 DIAGNOSIS — N186 End stage renal disease: Secondary | ICD-10-CM | POA: Diagnosis not present

## 2019-08-15 DIAGNOSIS — E1129 Type 2 diabetes mellitus with other diabetic kidney complication: Secondary | ICD-10-CM | POA: Diagnosis not present

## 2019-08-15 DIAGNOSIS — Z992 Dependence on renal dialysis: Secondary | ICD-10-CM | POA: Diagnosis not present

## 2019-08-15 DIAGNOSIS — N2581 Secondary hyperparathyroidism of renal origin: Secondary | ICD-10-CM | POA: Diagnosis not present

## 2019-08-15 DIAGNOSIS — D509 Iron deficiency anemia, unspecified: Secondary | ICD-10-CM | POA: Diagnosis not present

## 2019-08-17 DIAGNOSIS — I10 Essential (primary) hypertension: Secondary | ICD-10-CM | POA: Diagnosis not present

## 2019-08-17 DIAGNOSIS — E785 Hyperlipidemia, unspecified: Secondary | ICD-10-CM | POA: Diagnosis not present

## 2019-08-17 DIAGNOSIS — Z0000 Encounter for general adult medical examination without abnormal findings: Secondary | ICD-10-CM | POA: Diagnosis not present

## 2019-08-18 DIAGNOSIS — N2581 Secondary hyperparathyroidism of renal origin: Secondary | ICD-10-CM | POA: Diagnosis not present

## 2019-08-18 DIAGNOSIS — N186 End stage renal disease: Secondary | ICD-10-CM | POA: Diagnosis not present

## 2019-08-18 DIAGNOSIS — Z992 Dependence on renal dialysis: Secondary | ICD-10-CM | POA: Diagnosis not present

## 2019-08-18 DIAGNOSIS — D631 Anemia in chronic kidney disease: Secondary | ICD-10-CM | POA: Diagnosis not present

## 2019-08-18 DIAGNOSIS — D509 Iron deficiency anemia, unspecified: Secondary | ICD-10-CM | POA: Diagnosis not present

## 2019-08-18 DIAGNOSIS — E1129 Type 2 diabetes mellitus with other diabetic kidney complication: Secondary | ICD-10-CM | POA: Diagnosis not present

## 2019-08-20 DIAGNOSIS — E1129 Type 2 diabetes mellitus with other diabetic kidney complication: Secondary | ICD-10-CM | POA: Diagnosis not present

## 2019-08-20 DIAGNOSIS — N186 End stage renal disease: Secondary | ICD-10-CM | POA: Diagnosis not present

## 2019-08-20 DIAGNOSIS — D631 Anemia in chronic kidney disease: Secondary | ICD-10-CM | POA: Diagnosis not present

## 2019-08-20 DIAGNOSIS — D509 Iron deficiency anemia, unspecified: Secondary | ICD-10-CM | POA: Diagnosis not present

## 2019-08-20 DIAGNOSIS — N2581 Secondary hyperparathyroidism of renal origin: Secondary | ICD-10-CM | POA: Diagnosis not present

## 2019-08-20 DIAGNOSIS — Z992 Dependence on renal dialysis: Secondary | ICD-10-CM | POA: Diagnosis not present

## 2019-08-22 DIAGNOSIS — N186 End stage renal disease: Secondary | ICD-10-CM | POA: Diagnosis not present

## 2019-08-22 DIAGNOSIS — Z992 Dependence on renal dialysis: Secondary | ICD-10-CM | POA: Diagnosis not present

## 2019-08-22 DIAGNOSIS — N2581 Secondary hyperparathyroidism of renal origin: Secondary | ICD-10-CM | POA: Diagnosis not present

## 2019-08-22 DIAGNOSIS — E1129 Type 2 diabetes mellitus with other diabetic kidney complication: Secondary | ICD-10-CM | POA: Diagnosis not present

## 2019-08-22 DIAGNOSIS — D509 Iron deficiency anemia, unspecified: Secondary | ICD-10-CM | POA: Diagnosis not present

## 2019-08-22 DIAGNOSIS — D631 Anemia in chronic kidney disease: Secondary | ICD-10-CM | POA: Diagnosis not present

## 2019-08-25 DIAGNOSIS — D631 Anemia in chronic kidney disease: Secondary | ICD-10-CM | POA: Diagnosis not present

## 2019-08-25 DIAGNOSIS — N2581 Secondary hyperparathyroidism of renal origin: Secondary | ICD-10-CM | POA: Diagnosis not present

## 2019-08-25 DIAGNOSIS — Z992 Dependence on renal dialysis: Secondary | ICD-10-CM | POA: Diagnosis not present

## 2019-08-25 DIAGNOSIS — N186 End stage renal disease: Secondary | ICD-10-CM | POA: Diagnosis not present

## 2019-08-25 DIAGNOSIS — E1129 Type 2 diabetes mellitus with other diabetic kidney complication: Secondary | ICD-10-CM | POA: Diagnosis not present

## 2019-08-25 DIAGNOSIS — D509 Iron deficiency anemia, unspecified: Secondary | ICD-10-CM | POA: Diagnosis not present

## 2019-08-27 DIAGNOSIS — E1129 Type 2 diabetes mellitus with other diabetic kidney complication: Secondary | ICD-10-CM | POA: Diagnosis not present

## 2019-08-27 DIAGNOSIS — D509 Iron deficiency anemia, unspecified: Secondary | ICD-10-CM | POA: Diagnosis not present

## 2019-08-27 DIAGNOSIS — N2581 Secondary hyperparathyroidism of renal origin: Secondary | ICD-10-CM | POA: Diagnosis not present

## 2019-08-27 DIAGNOSIS — Z992 Dependence on renal dialysis: Secondary | ICD-10-CM | POA: Diagnosis not present

## 2019-08-27 DIAGNOSIS — N186 End stage renal disease: Secondary | ICD-10-CM | POA: Diagnosis not present

## 2019-08-27 DIAGNOSIS — D631 Anemia in chronic kidney disease: Secondary | ICD-10-CM | POA: Diagnosis not present

## 2019-08-29 DIAGNOSIS — N186 End stage renal disease: Secondary | ICD-10-CM | POA: Diagnosis not present

## 2019-08-29 DIAGNOSIS — D509 Iron deficiency anemia, unspecified: Secondary | ICD-10-CM | POA: Diagnosis not present

## 2019-08-29 DIAGNOSIS — N2581 Secondary hyperparathyroidism of renal origin: Secondary | ICD-10-CM | POA: Diagnosis not present

## 2019-08-29 DIAGNOSIS — D631 Anemia in chronic kidney disease: Secondary | ICD-10-CM | POA: Diagnosis not present

## 2019-08-29 DIAGNOSIS — E1129 Type 2 diabetes mellitus with other diabetic kidney complication: Secondary | ICD-10-CM | POA: Diagnosis not present

## 2019-08-29 DIAGNOSIS — Z992 Dependence on renal dialysis: Secondary | ICD-10-CM | POA: Diagnosis not present

## 2019-08-30 DIAGNOSIS — N186 End stage renal disease: Secondary | ICD-10-CM | POA: Diagnosis not present

## 2019-08-30 DIAGNOSIS — Z992 Dependence on renal dialysis: Secondary | ICD-10-CM | POA: Diagnosis not present

## 2019-08-30 DIAGNOSIS — E1122 Type 2 diabetes mellitus with diabetic chronic kidney disease: Secondary | ICD-10-CM | POA: Diagnosis not present

## 2019-09-01 DIAGNOSIS — N186 End stage renal disease: Secondary | ICD-10-CM | POA: Diagnosis not present

## 2019-09-01 DIAGNOSIS — D509 Iron deficiency anemia, unspecified: Secondary | ICD-10-CM | POA: Diagnosis not present

## 2019-09-01 DIAGNOSIS — Z23 Encounter for immunization: Secondary | ICD-10-CM | POA: Diagnosis not present

## 2019-09-01 DIAGNOSIS — Z992 Dependence on renal dialysis: Secondary | ICD-10-CM | POA: Diagnosis not present

## 2019-09-01 DIAGNOSIS — N2581 Secondary hyperparathyroidism of renal origin: Secondary | ICD-10-CM | POA: Diagnosis not present

## 2019-09-01 DIAGNOSIS — D631 Anemia in chronic kidney disease: Secondary | ICD-10-CM | POA: Diagnosis not present

## 2019-09-01 DIAGNOSIS — E1129 Type 2 diabetes mellitus with other diabetic kidney complication: Secondary | ICD-10-CM | POA: Diagnosis not present

## 2019-09-03 DIAGNOSIS — E1129 Type 2 diabetes mellitus with other diabetic kidney complication: Secondary | ICD-10-CM | POA: Diagnosis not present

## 2019-09-03 DIAGNOSIS — N186 End stage renal disease: Secondary | ICD-10-CM | POA: Diagnosis not present

## 2019-09-03 DIAGNOSIS — D631 Anemia in chronic kidney disease: Secondary | ICD-10-CM | POA: Diagnosis not present

## 2019-09-03 DIAGNOSIS — N2581 Secondary hyperparathyroidism of renal origin: Secondary | ICD-10-CM | POA: Diagnosis not present

## 2019-09-03 DIAGNOSIS — Z992 Dependence on renal dialysis: Secondary | ICD-10-CM | POA: Diagnosis not present

## 2019-09-03 DIAGNOSIS — D509 Iron deficiency anemia, unspecified: Secondary | ICD-10-CM | POA: Diagnosis not present

## 2019-09-05 DIAGNOSIS — N2581 Secondary hyperparathyroidism of renal origin: Secondary | ICD-10-CM | POA: Diagnosis not present

## 2019-09-05 DIAGNOSIS — D631 Anemia in chronic kidney disease: Secondary | ICD-10-CM | POA: Diagnosis not present

## 2019-09-05 DIAGNOSIS — D509 Iron deficiency anemia, unspecified: Secondary | ICD-10-CM | POA: Diagnosis not present

## 2019-09-05 DIAGNOSIS — Z992 Dependence on renal dialysis: Secondary | ICD-10-CM | POA: Diagnosis not present

## 2019-09-05 DIAGNOSIS — E1129 Type 2 diabetes mellitus with other diabetic kidney complication: Secondary | ICD-10-CM | POA: Diagnosis not present

## 2019-09-05 DIAGNOSIS — N186 End stage renal disease: Secondary | ICD-10-CM | POA: Diagnosis not present

## 2019-09-08 DIAGNOSIS — N2581 Secondary hyperparathyroidism of renal origin: Secondary | ICD-10-CM | POA: Diagnosis not present

## 2019-09-08 DIAGNOSIS — N186 End stage renal disease: Secondary | ICD-10-CM | POA: Diagnosis not present

## 2019-09-08 DIAGNOSIS — D509 Iron deficiency anemia, unspecified: Secondary | ICD-10-CM | POA: Diagnosis not present

## 2019-09-08 DIAGNOSIS — Z992 Dependence on renal dialysis: Secondary | ICD-10-CM | POA: Diagnosis not present

## 2019-09-08 DIAGNOSIS — E1129 Type 2 diabetes mellitus with other diabetic kidney complication: Secondary | ICD-10-CM | POA: Diagnosis not present

## 2019-09-08 DIAGNOSIS — D631 Anemia in chronic kidney disease: Secondary | ICD-10-CM | POA: Diagnosis not present

## 2019-09-09 ENCOUNTER — Other Ambulatory Visit: Payer: Self-pay | Admitting: Endocrinology

## 2019-09-10 ENCOUNTER — Other Ambulatory Visit: Payer: Self-pay | Admitting: Endocrinology

## 2019-09-10 DIAGNOSIS — Z992 Dependence on renal dialysis: Secondary | ICD-10-CM | POA: Diagnosis not present

## 2019-09-10 DIAGNOSIS — N2581 Secondary hyperparathyroidism of renal origin: Secondary | ICD-10-CM | POA: Diagnosis not present

## 2019-09-10 DIAGNOSIS — D631 Anemia in chronic kidney disease: Secondary | ICD-10-CM | POA: Diagnosis not present

## 2019-09-10 DIAGNOSIS — E1129 Type 2 diabetes mellitus with other diabetic kidney complication: Secondary | ICD-10-CM | POA: Diagnosis not present

## 2019-09-10 DIAGNOSIS — D509 Iron deficiency anemia, unspecified: Secondary | ICD-10-CM | POA: Diagnosis not present

## 2019-09-10 DIAGNOSIS — N186 End stage renal disease: Secondary | ICD-10-CM | POA: Diagnosis not present

## 2019-09-12 DIAGNOSIS — N2581 Secondary hyperparathyroidism of renal origin: Secondary | ICD-10-CM | POA: Diagnosis not present

## 2019-09-12 DIAGNOSIS — D509 Iron deficiency anemia, unspecified: Secondary | ICD-10-CM | POA: Diagnosis not present

## 2019-09-12 DIAGNOSIS — Z992 Dependence on renal dialysis: Secondary | ICD-10-CM | POA: Diagnosis not present

## 2019-09-12 DIAGNOSIS — E1129 Type 2 diabetes mellitus with other diabetic kidney complication: Secondary | ICD-10-CM | POA: Diagnosis not present

## 2019-09-12 DIAGNOSIS — N186 End stage renal disease: Secondary | ICD-10-CM | POA: Diagnosis not present

## 2019-09-12 DIAGNOSIS — D631 Anemia in chronic kidney disease: Secondary | ICD-10-CM | POA: Diagnosis not present

## 2019-09-15 DIAGNOSIS — D509 Iron deficiency anemia, unspecified: Secondary | ICD-10-CM | POA: Diagnosis not present

## 2019-09-15 DIAGNOSIS — E1129 Type 2 diabetes mellitus with other diabetic kidney complication: Secondary | ICD-10-CM | POA: Diagnosis not present

## 2019-09-15 DIAGNOSIS — Z992 Dependence on renal dialysis: Secondary | ICD-10-CM | POA: Diagnosis not present

## 2019-09-15 DIAGNOSIS — D631 Anemia in chronic kidney disease: Secondary | ICD-10-CM | POA: Diagnosis not present

## 2019-09-15 DIAGNOSIS — N186 End stage renal disease: Secondary | ICD-10-CM | POA: Diagnosis not present

## 2019-09-15 DIAGNOSIS — N2581 Secondary hyperparathyroidism of renal origin: Secondary | ICD-10-CM | POA: Diagnosis not present

## 2019-09-17 DIAGNOSIS — D631 Anemia in chronic kidney disease: Secondary | ICD-10-CM | POA: Diagnosis not present

## 2019-09-17 DIAGNOSIS — N2581 Secondary hyperparathyroidism of renal origin: Secondary | ICD-10-CM | POA: Diagnosis not present

## 2019-09-17 DIAGNOSIS — E1129 Type 2 diabetes mellitus with other diabetic kidney complication: Secondary | ICD-10-CM | POA: Diagnosis not present

## 2019-09-17 DIAGNOSIS — D509 Iron deficiency anemia, unspecified: Secondary | ICD-10-CM | POA: Diagnosis not present

## 2019-09-17 DIAGNOSIS — N186 End stage renal disease: Secondary | ICD-10-CM | POA: Diagnosis not present

## 2019-09-17 DIAGNOSIS — Z992 Dependence on renal dialysis: Secondary | ICD-10-CM | POA: Diagnosis not present

## 2019-09-19 DIAGNOSIS — Z992 Dependence on renal dialysis: Secondary | ICD-10-CM | POA: Diagnosis not present

## 2019-09-19 DIAGNOSIS — D631 Anemia in chronic kidney disease: Secondary | ICD-10-CM | POA: Diagnosis not present

## 2019-09-19 DIAGNOSIS — E1129 Type 2 diabetes mellitus with other diabetic kidney complication: Secondary | ICD-10-CM | POA: Diagnosis not present

## 2019-09-19 DIAGNOSIS — D509 Iron deficiency anemia, unspecified: Secondary | ICD-10-CM | POA: Diagnosis not present

## 2019-09-19 DIAGNOSIS — N186 End stage renal disease: Secondary | ICD-10-CM | POA: Diagnosis not present

## 2019-09-19 DIAGNOSIS — N2581 Secondary hyperparathyroidism of renal origin: Secondary | ICD-10-CM | POA: Diagnosis not present

## 2019-09-22 DIAGNOSIS — E1129 Type 2 diabetes mellitus with other diabetic kidney complication: Secondary | ICD-10-CM | POA: Diagnosis not present

## 2019-09-22 DIAGNOSIS — N186 End stage renal disease: Secondary | ICD-10-CM | POA: Diagnosis not present

## 2019-09-22 DIAGNOSIS — D631 Anemia in chronic kidney disease: Secondary | ICD-10-CM | POA: Diagnosis not present

## 2019-09-22 DIAGNOSIS — Z992 Dependence on renal dialysis: Secondary | ICD-10-CM | POA: Diagnosis not present

## 2019-09-22 DIAGNOSIS — D509 Iron deficiency anemia, unspecified: Secondary | ICD-10-CM | POA: Diagnosis not present

## 2019-09-22 DIAGNOSIS — N2581 Secondary hyperparathyroidism of renal origin: Secondary | ICD-10-CM | POA: Diagnosis not present

## 2019-09-23 ENCOUNTER — Other Ambulatory Visit: Payer: Self-pay

## 2019-09-23 ENCOUNTER — Ambulatory Visit (INDEPENDENT_AMBULATORY_CARE_PROVIDER_SITE_OTHER): Payer: Medicare Other | Admitting: Podiatry

## 2019-09-23 DIAGNOSIS — M2042 Other hammer toe(s) (acquired), left foot: Secondary | ICD-10-CM | POA: Diagnosis not present

## 2019-09-23 DIAGNOSIS — M2041 Other hammer toe(s) (acquired), right foot: Secondary | ICD-10-CM | POA: Diagnosis not present

## 2019-09-23 DIAGNOSIS — B351 Tinea unguium: Secondary | ICD-10-CM | POA: Diagnosis not present

## 2019-09-23 DIAGNOSIS — M79675 Pain in left toe(s): Secondary | ICD-10-CM

## 2019-09-23 DIAGNOSIS — E1151 Type 2 diabetes mellitus with diabetic peripheral angiopathy without gangrene: Secondary | ICD-10-CM | POA: Diagnosis not present

## 2019-09-23 DIAGNOSIS — M79674 Pain in right toe(s): Secondary | ICD-10-CM | POA: Diagnosis not present

## 2019-09-24 DIAGNOSIS — D631 Anemia in chronic kidney disease: Secondary | ICD-10-CM | POA: Diagnosis not present

## 2019-09-24 DIAGNOSIS — Z992 Dependence on renal dialysis: Secondary | ICD-10-CM | POA: Diagnosis not present

## 2019-09-24 DIAGNOSIS — E1129 Type 2 diabetes mellitus with other diabetic kidney complication: Secondary | ICD-10-CM | POA: Diagnosis not present

## 2019-09-24 DIAGNOSIS — N2581 Secondary hyperparathyroidism of renal origin: Secondary | ICD-10-CM | POA: Diagnosis not present

## 2019-09-24 DIAGNOSIS — N186 End stage renal disease: Secondary | ICD-10-CM | POA: Diagnosis not present

## 2019-09-24 DIAGNOSIS — D509 Iron deficiency anemia, unspecified: Secondary | ICD-10-CM | POA: Diagnosis not present

## 2019-09-25 ENCOUNTER — Encounter: Payer: Self-pay | Admitting: Podiatry

## 2019-09-25 ENCOUNTER — Other Ambulatory Visit: Payer: Medicare Other

## 2019-09-25 NOTE — Progress Notes (Signed)
  Subjective:  Patient ID: Julie Brewer, female    DOB: Jul 17, 1950,  MRN: [id number]  Chief Complaint  Patient presents with  . Nail Problem    Nail trim 1-5 bilateral  . Callouses    Bilateral plantar callous trim/check   69 y.o. female returns for the above complaint.  Patient is a type II diabetic whose A1c is 6.1.  Patient presents with thickened elongated mycotic toenails x10.  Patient states that she is not able to cut it herself. She also has secondary complaint of hyperkeratotic lesion secondary to hammertoe contractures. She states that she would like to discuss diabetic shoes as she has worn the previous pair out and has been greater than 1 year. She has recently seen her primary care physician as well. She denies any other acute complaints.  Objective:  There were no vitals filed for this visit. Podiatric Exam: Vascular: dorsalis pedis and posterior tibial pulses are palpable bilateral. Capillary return is immediate. Temperature gradient is WNL. Skin turgor WNL  Sensorium: Normal Semmes Weinstein monofilament test. Normal tactile sensation bilaterally. Nail Exam: Pt has thick disfigured discolored nails with subungual debris noted bilateral entire nail hallux through fifth toenails Ulcer Exam: There is no evidence of ulcer or pre-ulcerative changes or infection. Orthopedic Exam: Muscle tone and strength are WNL. No limitations in general ROM. No crepitus or effusions noted. HAV  B/L.  Hammer toes 2-5  B/L. Skin: No Porokeratosis. No infection or ulcers.  Submet 3 hyperkeratotic lesion/callus x2.  Pain on palpation.  Assessment & Plan:  Patient was evaluated and treated and all questions answered.  Hammertoe contractures bilateral two through five -I explained to patient the etiology of hammertoe contracture as well as treatment options were discussed. Given that patient has underlying diabetes with contractures of the hammertoes I believe patient will benefit from diabetic  shoes to prevent ulceration leading to amputation. Patient agrees with the plan. -She will be scheduled see rec for diabetic shoes  Bilateral submet 3 hyperkeratotic lesion/callus x2 -Resolving  Onychomycosis with pain  -Nails palliatively debrided as below. -Educated on self-care  Procedure: Nail Debridement Rationale: pain  Type of Debridement: manual, sharp debridement. Instrumentation: Nail nipper, rotary burr. Number of Nails: 10  Procedures and Treatment: Consent by patient was obtained for treatment procedures. The patient understood the discussion of treatment and procedures well. All questions were answered thoroughly reviewed. Debridement of mycotic and hypertrophic toenails, 1 through 5 bilateral and clearing of subungual debris. No ulceration, no infection noted.  Return Visit-Office Procedure: Patient instructed to return to the office for a follow up visit 3 months for continued evaluation and treatment.  Boneta Lucks, DPM    No follow-ups on file.

## 2019-09-26 DIAGNOSIS — D509 Iron deficiency anemia, unspecified: Secondary | ICD-10-CM | POA: Diagnosis not present

## 2019-09-26 DIAGNOSIS — D631 Anemia in chronic kidney disease: Secondary | ICD-10-CM | POA: Diagnosis not present

## 2019-09-26 DIAGNOSIS — N2581 Secondary hyperparathyroidism of renal origin: Secondary | ICD-10-CM | POA: Diagnosis not present

## 2019-09-26 DIAGNOSIS — Z992 Dependence on renal dialysis: Secondary | ICD-10-CM | POA: Diagnosis not present

## 2019-09-26 DIAGNOSIS — N186 End stage renal disease: Secondary | ICD-10-CM | POA: Diagnosis not present

## 2019-09-26 DIAGNOSIS — E1129 Type 2 diabetes mellitus with other diabetic kidney complication: Secondary | ICD-10-CM | POA: Diagnosis not present

## 2019-09-28 ENCOUNTER — Other Ambulatory Visit: Payer: Self-pay | Admitting: Family Medicine

## 2019-09-28 ENCOUNTER — Other Ambulatory Visit: Payer: Self-pay

## 2019-09-28 ENCOUNTER — Other Ambulatory Visit (INDEPENDENT_AMBULATORY_CARE_PROVIDER_SITE_OTHER): Payer: Medicare Other

## 2019-09-28 DIAGNOSIS — Z794 Long term (current) use of insulin: Secondary | ICD-10-CM

## 2019-09-28 DIAGNOSIS — E1165 Type 2 diabetes mellitus with hyperglycemia: Secondary | ICD-10-CM | POA: Diagnosis not present

## 2019-09-28 DIAGNOSIS — Z1231 Encounter for screening mammogram for malignant neoplasm of breast: Secondary | ICD-10-CM

## 2019-09-28 LAB — HEMOGLOBIN A1C: Hgb A1c MFr Bld: 6.5 % (ref 4.6–6.5)

## 2019-09-28 LAB — GLUCOSE, RANDOM: Glucose, Bld: 166 mg/dL — ABNORMAL HIGH (ref 70–99)

## 2019-09-29 DIAGNOSIS — N2581 Secondary hyperparathyroidism of renal origin: Secondary | ICD-10-CM | POA: Diagnosis not present

## 2019-09-29 DIAGNOSIS — Z992 Dependence on renal dialysis: Secondary | ICD-10-CM | POA: Diagnosis not present

## 2019-09-29 DIAGNOSIS — N186 End stage renal disease: Secondary | ICD-10-CM | POA: Diagnosis not present

## 2019-09-29 DIAGNOSIS — E1129 Type 2 diabetes mellitus with other diabetic kidney complication: Secondary | ICD-10-CM | POA: Diagnosis not present

## 2019-09-29 DIAGNOSIS — D509 Iron deficiency anemia, unspecified: Secondary | ICD-10-CM | POA: Diagnosis not present

## 2019-09-29 DIAGNOSIS — D631 Anemia in chronic kidney disease: Secondary | ICD-10-CM | POA: Diagnosis not present

## 2019-09-29 LAB — FRUCTOSAMINE: Fructosamine: 293 umol/L — ABNORMAL HIGH (ref 0–285)

## 2019-09-30 ENCOUNTER — Ambulatory Visit (INDEPENDENT_AMBULATORY_CARE_PROVIDER_SITE_OTHER): Payer: Medicare Other | Admitting: Endocrinology

## 2019-09-30 ENCOUNTER — Other Ambulatory Visit: Payer: Self-pay | Admitting: Endocrinology

## 2019-09-30 ENCOUNTER — Other Ambulatory Visit: Payer: Self-pay

## 2019-09-30 ENCOUNTER — Encounter: Payer: Self-pay | Admitting: Endocrinology

## 2019-09-30 VITALS — BP 120/56 | HR 92 | Ht 63.0 in | Wt 137.2 lb

## 2019-09-30 DIAGNOSIS — N186 End stage renal disease: Secondary | ICD-10-CM | POA: Diagnosis not present

## 2019-09-30 DIAGNOSIS — E1122 Type 2 diabetes mellitus with diabetic chronic kidney disease: Secondary | ICD-10-CM | POA: Diagnosis not present

## 2019-09-30 DIAGNOSIS — I70212 Atherosclerosis of native arteries of extremities with intermittent claudication, left leg: Secondary | ICD-10-CM

## 2019-09-30 DIAGNOSIS — Z992 Dependence on renal dialysis: Secondary | ICD-10-CM | POA: Diagnosis not present

## 2019-09-30 DIAGNOSIS — Z794 Long term (current) use of insulin: Secondary | ICD-10-CM | POA: Diagnosis not present

## 2019-09-30 DIAGNOSIS — E782 Mixed hyperlipidemia: Secondary | ICD-10-CM | POA: Diagnosis not present

## 2019-09-30 DIAGNOSIS — E1165 Type 2 diabetes mellitus with hyperglycemia: Secondary | ICD-10-CM

## 2019-09-30 NOTE — Progress Notes (Signed)
Patient ID: Julie Brewer, female   DOB: [birth year], 69 y.o.   MRN: [id number]   Reason for Appointment: Follow-up of various problems  History of Present Illness    Diagnosis: Type 2 DIABETES MELITUS, date of diagnosis:  1985  Prior history: She has been on insulin since diagnosis and on Lantus previously Also at some point had been started on Glucophage several years ago also Because of insurance preference Lantus was changed to Levemir  She refuses to use analog rapid acting insulin because of cost and is using regular insulin for several years   Her blood sugars are generally well controlled and A1c usually under 7% Her A1c previously was higher with stopping metformin at 8%  Recent history:   Insulin regimen: Lantus insulin 14U in the morning daily.  Regular insulin 30 minutes Before eating, 10 units a.m. and 5-7 ac supper  Oral hypoglycemic drugs: None   Current blood sugar patterns, management and problems:  Her A1c is now 6.5, was 6.1 Fructosamine 293, previously has been lower   She depends on her daughter to check her blood sugars and has checked them only very sporadically and only on 3 days last month  Not clear if her fasting readings are consistently high, lab glucose was 166 and she had one good reading of 121  She had one unusually high reading of 377 after eating cookies  As before she frequently will not have symptoms with hypoglycemia and not clear if she is having any episodes  Her weight is somewhat lower but generally fluctuates  Overall her diet is about the same  She is only doing some walking within the house and no formal exercise  Usually consistent with taking her insulin as directed  Suppertime dose will be 7 units if she is eating more starchy foods  Side effects from medications: None      Glucometer:  FreeStyle  Blood sugars by review of download:  PRE-MEAL Fasting Lunch Dinner Bedtime Overall  Glucose range:   121, 173   99, 377 ?   Mean/median:          Meals:  usually 2 meals per day at 10 AM and 5 PM.     Mealtime protein sources:turkey, chicken.  Eating cereal for breakfast periodically Avoiding sweet drinks  Physical activity: exercise: Some walking within the house              Wt Readings from Last 3 Encounters:  09/30/19 137 lb 3.2 oz (62.2 kg)  06/24/19 141 lb (64 kg)  04/27/19 139 lb (63 kg)   Lab Results  Component Value Date   HGBA1C 6.5 09/28/2019   HGBA1C 6.1 (A) 06/24/2019   HGBA1C 7.1 (H) 02/07/2019   Lab Results  Component Value Date   MICROALBUR 196.7 (H) 03/11/2018   LDLCALC 70 04/24/2019   CREATININE 2.44 (H) 06/25/2019      Lab Results  Component Value Date   FRUCTOSAMINE 293 (H) 09/28/2019   FRUCTOSAMINE 218 03/11/2018   FRUCTOSAMINE 229 09/13/2017    Other active problems: See review of systems      Allergies as of 09/30/2019      Reactions   Morphine And Related Other (See Comments)   Hallucinations    Penicillins Swelling, Rash   Throat swelling Did it involve swelling of the face/tongue/throat, SOB, or low BP? Yes Did it involve sudden or severe rash/hives, skin peeling, or any reaction on the inside of your mouth or nose?  Yes Did you need to seek medical attention at a hospital or doctor's office? Yes When did it last happen?young child If all above answers are "NO", may proceed with cephalosporin use.      Medication List       Accurate as of September 30, 2019  8:38 PM. If you have any questions, ask your nurse or doctor.        acetaminophen 325 MG tablet Commonly known as: TYLENOL Take 2 tablets (650 mg total) by mouth every 6 (six) hours as needed for mild pain (or Fever >/= 101).   amLODipine 10 MG tablet Commonly known as: NORVASC SMARTSIG:.5 Tablet(s) By Mouth Daily   aspirin 325 MG EC tablet Take 325 mg by mouth daily.   atorvastatin 20 MG tablet Commonly known as: LIPITOR Take 1 tablet by mouth once daily    bisacodyl 5 MG EC tablet Commonly known as: DULCOLAX Take 5 mg by mouth daily as needed for moderate constipation.   calcitRIOL 0.5 MCG capsule Commonly known as: ROCALTROL Take by mouth.   cilostazol 100 MG tablet Commonly known as: PLETAL Take 1 tablet (100 mg total) by mouth 2 (two) times daily.   docusate sodium 100 MG capsule Commonly known as: COLACE Take 200 mg by mouth daily.   ferrous sulfate 325 (65 FE) MG tablet Take 650 mg by mouth daily.   FREESTYLE LITE test strip Generic drug: glucose blood USE AS INSTRUCTED TO CHECK BLOOD SUGAR ONCE DAILY.   gabapentin 100 MG capsule Commonly known as: NEURONTIN TAKE 1 CAPSULE BY MOUTH TWICE DAILY AS NEEDED   Lantus 100 UNIT/ML injection Generic drug: insulin glargine Inject 14 Units into the skin daily. Inject 14 units under the skin once daily.   lidocaine 2 % injection Commonly known as: XYLOCAINE Inject into the skin.   lidocaine-prilocaine cream Commonly known as: EMLA SMARTSIG:Sparingly Topical   MIRCERA IJ Mircera   NovoLIN R 100 units/mL injection Generic drug: insulin regular Inject into the skin 2 (two) times daily before a meal. Inject 10 units under the skin before breakfast and 5 units before dinner.   omeprazole 20 MG capsule Commonly known as: PRILOSEC TAKE 1 CAPSULE BY MOUTH ONCE DAILY ( DUE FOR OFFICE VISIT THIS MONTH)   oxyCODONE 5 MG immediate release tablet Commonly known as: Roxicodone Take 1 tablet (5 mg total) by mouth every 4 (four) hours as needed.   Vitamin D (Ergocalciferol) 1.25 MG (50000 UNIT) Caps capsule Commonly known as: DRISDOL Take 1 capsule by mouth once a week       Allergies:  Allergies  Allergen Reactions  . Morphine And Related Other (See Comments)    Hallucinations   . Penicillins Swelling and Rash    Throat swelling Did it involve swelling of the face/tongue/throat, SOB, or low BP? Yes Did it involve sudden or severe rash/hives, skin peeling, or any  reaction on the inside of your mouth or nose? Yes Did you need to seek medical attention at a hospital or doctor's office? Yes When did it last happen?young child If all above answers are "NO", may proceed with cephalosporin use.    Past Medical History:  Diagnosis Date  . Asthma    No probnlems recently  . 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 CHF (congestive heart failure) ([place])   . Chronic kidney disease    Tu/Th/Sa  . Diabetes mellitus    Type II per  Dr [name]  (patient said type I)  . Fibroid   . GERD (gastroesophageal reflux disease)   . Glaucoma   . Hyperlipidemia   . Hypertension   . Iron deficiency anemia 03/09/2016  . Peripheral vascular disease ([place])   . Pneumonia 2006  . PONV (postoperative nausea and vomiting)   . Shortness of breath dyspnea    with exdrtion, "Walkling too fast"  . Stroke [facility])    no residual    Past Surgical History:  Procedure Laterality Date  . ABDOMINAL HYSTERECTOMY    . AV FISTULA PLACEMENT Left 04/27/2019   Procedure: INSERTION OF ARTERIOVENOUS (AV) GORE-TEX GRAFT LEFT UPPER ARM;  Surgeon: Angelia Mould, MD;  Location: [place];  Service: Vascular;  Laterality: Left;  . BIOPSY  06/10/2017   Procedure: BIOPSY;  Surgeon: Ronnette Juniper, MD;  Location: [place];  Service: Gastroenterology;;  . CERVICAL FUSION     with graft from hip  . COLONOSCOPY  July 09, 2012  . DIRECT LARYNGOSCOPY N/A 06/07/2014   Procedure: DIRECT LARYNGOSCOPY with BIOPSY and EXCISION VOLLECULAR CYST;  Surgeon: Ruby Cola, MD;  Location: [facility] OR;  Service: ENT;  Laterality: N/A;  . ESOPHAGOGASTRODUODENOSCOPY (EGD) WITH PROPOFOL Left 06/10/2017   Procedure: ESOPHAGOGASTRODUODENOSCOPY (EGD) WITH PROPOFOL;  Surgeon: Ronnette Juniper, MD;  Location: [place];  Service: Gastroenterology;  Laterality: Left;  . FLEXIBLE SIGMOIDOSCOPY Left 06/10/2017   Procedure: FLEXIBLE SIGMOIDOSCOPY;  Surgeon: Ronnette Juniper, MD;   Location: [place];  Service: Gastroenterology;  Laterality: Left;  . LOOP RECORDER INSERTION N/A 06/20/2016   Procedure: Loop Recorder Insertion;  Surgeon: [name], MD;  Location: [place] [facility] LAB;  Service: Cardiovascular;  Laterality: N/A;  . REFRACTIVE SURGERY Bilateral    both eyes  . SPINE SURGERY     lumbar    Family History  Problem Relation Age of Onset  . Cancer Mother   . Heart disease Mother   . Diabetes Father   . Cancer Brother   . Cancer Brother   . Throat cancer Brother     Social History:  reports that she has been smoking cigarettes. She has a 30.00 pack-year smoking history. She has never used smokeless tobacco. She reports previous alcohol use. She reports previous drug use. Drug: Methylphenidate.  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     HYPERLIPIDEMIA: The lipid abnormality consists of elevated LDL controlled with Lipitor 20 mg .   Her baseline LDL was 202  She is taking this regularly Recent ALT was also normal  Lab Results  Component Value Date   CHOL 137 04/24/2019   HDL 44.00 04/24/2019   LDLCALC 70 04/24/2019   TRIG 115.0 04/24/2019   CHOLHDL 3 04/24/2019    Vitamin D: Off 50,000 unit dosage     However now she is on calcitriol from the nephrologist   Lab Results  Component Value Date   VD25OH 40.22 09/13/2016  Last diabetic foot exam was done in 9/21  Has been taking gabapentin 100mg   twice daily for lower leg leg pains long-term with relief  She has had her Covid vaccines   Examination:   BP (!) 120/56 (BP Location: Right Arm, Patient Position: Sitting, Cuff Size: Normal)   Pulse 92   Ht 5\' 3"  (1.6 m)   Wt 137 lb 3.2 oz (62.2 kg)   SpO2 92%   BMI 24.30 kg/m   Body mass index is 24.3 kg/m.    Diabetic Foot Exam - Simple   Simple Foot Form Diabetic Foot exam was performed with the following findings: Yes   Visual Inspection No deformities, no ulcerations, no other skin breakdown bilaterally: Yes Sensation Testing Intact to touch and monofilament testing bilaterally: Yes Pulse Check Posterior Tibialis and Dorsalis pulse intact bilaterally: Yes See comments: Yes Comments Pulses absent on the left foot     ASSESSMENT/ PLAN:     Diabetes type 2 on insulin See history of present illness for detailed discussion of current diabetes management, blood sugar patterns and problems identified  Her A1c is still fairly good at 6.5 Fructosamine is 293 and higher than her past measurements However considering her limitations for monitoring, types of insulin being dependent on her family for care she is doing fairly well  A1c may not be accurate because of her anemia and renal failure  She has only one unusually high reading of 377 after eating a lot of cookies otherwise did not see any consistently high readings Now we will continue her regimen unchanged Her daughter was present and was reminded about the need to check blood sugars periodically and let us know if fasting readings are consistently high   HYPERLIPIDEMIA: Has been on 20 mg Lipitor with good control and will be rechecked on the next visit  Hypertension: Only on amlodipine now, followed by nephrologist        Patient Instructions  Check blood sugars on waking up 2-3 days a week  Also check blood sugars about 2 hours after meals and do this after different meals by rotation  Recommended  blood sugar levels on waking up are 90-130 and about 2 hours after meal is 130-180  Please bring your blood sugar monitor to each visit, thank you     Elayne Snare 09/30/2019, 8:38 PM     Note: This office note was prepared with Dragon voice recognition system technology. Any transcriptional errors that result from this process are unintentional.

## 2019-09-30 NOTE — Patient Instructions (Signed)
Check blood sugars on waking up 2-3 days a week  Also check blood sugars about 2 hours after meals and do this after different meals by rotation  Recommended blood sugar levels on waking up are 90-130 and about 2 hours after meal is 130-180  Please bring your blood sugar monitor to each visit, thank you   

## 2019-10-01 DIAGNOSIS — N186 End stage renal disease: Secondary | ICD-10-CM | POA: Diagnosis not present

## 2019-10-01 DIAGNOSIS — E1129 Type 2 diabetes mellitus with other diabetic kidney complication: Secondary | ICD-10-CM | POA: Diagnosis not present

## 2019-10-01 DIAGNOSIS — D509 Iron deficiency anemia, unspecified: Secondary | ICD-10-CM | POA: Diagnosis not present

## 2019-10-01 DIAGNOSIS — Z992 Dependence on renal dialysis: Secondary | ICD-10-CM | POA: Diagnosis not present

## 2019-10-01 DIAGNOSIS — N2581 Secondary hyperparathyroidism of renal origin: Secondary | ICD-10-CM | POA: Diagnosis not present

## 2019-10-01 DIAGNOSIS — D631 Anemia in chronic kidney disease: Secondary | ICD-10-CM | POA: Diagnosis not present

## 2019-10-03 DIAGNOSIS — N2581 Secondary hyperparathyroidism of renal origin: Secondary | ICD-10-CM | POA: Diagnosis not present

## 2019-10-03 DIAGNOSIS — Z992 Dependence on renal dialysis: Secondary | ICD-10-CM | POA: Diagnosis not present

## 2019-10-03 DIAGNOSIS — D509 Iron deficiency anemia, unspecified: Secondary | ICD-10-CM | POA: Diagnosis not present

## 2019-10-03 DIAGNOSIS — E1129 Type 2 diabetes mellitus with other diabetic kidney complication: Secondary | ICD-10-CM | POA: Diagnosis not present

## 2019-10-03 DIAGNOSIS — N186 End stage renal disease: Secondary | ICD-10-CM | POA: Diagnosis not present

## 2019-10-03 DIAGNOSIS — D631 Anemia in chronic kidney disease: Secondary | ICD-10-CM | POA: Diagnosis not present

## 2019-10-06 DIAGNOSIS — E1129 Type 2 diabetes mellitus with other diabetic kidney complication: Secondary | ICD-10-CM | POA: Diagnosis not present

## 2019-10-06 DIAGNOSIS — D509 Iron deficiency anemia, unspecified: Secondary | ICD-10-CM | POA: Diagnosis not present

## 2019-10-06 DIAGNOSIS — N2581 Secondary hyperparathyroidism of renal origin: Secondary | ICD-10-CM | POA: Diagnosis not present

## 2019-10-06 DIAGNOSIS — N186 End stage renal disease: Secondary | ICD-10-CM | POA: Diagnosis not present

## 2019-10-06 DIAGNOSIS — Z992 Dependence on renal dialysis: Secondary | ICD-10-CM | POA: Diagnosis not present

## 2019-10-06 DIAGNOSIS — D631 Anemia in chronic kidney disease: Secondary | ICD-10-CM | POA: Diagnosis not present

## 2019-10-07 ENCOUNTER — Other Ambulatory Visit: Payer: Self-pay | Admitting: Endocrinology

## 2019-10-09 ENCOUNTER — Other Ambulatory Visit: Payer: Self-pay | Admitting: Endocrinology

## 2019-10-09 ENCOUNTER — Other Ambulatory Visit: Payer: Medicare Other | Admitting: Orthotics

## 2019-10-10 DIAGNOSIS — N2581 Secondary hyperparathyroidism of renal origin: Secondary | ICD-10-CM | POA: Diagnosis not present

## 2019-10-10 DIAGNOSIS — Z992 Dependence on renal dialysis: Secondary | ICD-10-CM | POA: Diagnosis not present

## 2019-10-10 DIAGNOSIS — E1129 Type 2 diabetes mellitus with other diabetic kidney complication: Secondary | ICD-10-CM | POA: Diagnosis not present

## 2019-10-10 DIAGNOSIS — D509 Iron deficiency anemia, unspecified: Secondary | ICD-10-CM | POA: Diagnosis not present

## 2019-10-10 DIAGNOSIS — D631 Anemia in chronic kidney disease: Secondary | ICD-10-CM | POA: Diagnosis not present

## 2019-10-10 DIAGNOSIS — N186 End stage renal disease: Secondary | ICD-10-CM | POA: Diagnosis not present

## 2019-10-13 DIAGNOSIS — N2581 Secondary hyperparathyroidism of renal origin: Secondary | ICD-10-CM | POA: Diagnosis not present

## 2019-10-13 DIAGNOSIS — D509 Iron deficiency anemia, unspecified: Secondary | ICD-10-CM | POA: Diagnosis not present

## 2019-10-13 DIAGNOSIS — N186 End stage renal disease: Secondary | ICD-10-CM | POA: Diagnosis not present

## 2019-10-13 DIAGNOSIS — Z992 Dependence on renal dialysis: Secondary | ICD-10-CM | POA: Diagnosis not present

## 2019-10-13 DIAGNOSIS — D631 Anemia in chronic kidney disease: Secondary | ICD-10-CM | POA: Diagnosis not present

## 2019-10-13 DIAGNOSIS — E1129 Type 2 diabetes mellitus with other diabetic kidney complication: Secondary | ICD-10-CM | POA: Diagnosis not present

## 2019-10-15 DIAGNOSIS — D509 Iron deficiency anemia, unspecified: Secondary | ICD-10-CM | POA: Diagnosis not present

## 2019-10-15 DIAGNOSIS — N2581 Secondary hyperparathyroidism of renal origin: Secondary | ICD-10-CM | POA: Diagnosis not present

## 2019-10-15 DIAGNOSIS — D631 Anemia in chronic kidney disease: Secondary | ICD-10-CM | POA: Diagnosis not present

## 2019-10-15 DIAGNOSIS — Z992 Dependence on renal dialysis: Secondary | ICD-10-CM | POA: Diagnosis not present

## 2019-10-15 DIAGNOSIS — E1129 Type 2 diabetes mellitus with other diabetic kidney complication: Secondary | ICD-10-CM | POA: Diagnosis not present

## 2019-10-15 DIAGNOSIS — N186 End stage renal disease: Secondary | ICD-10-CM | POA: Diagnosis not present

## 2019-10-17 DIAGNOSIS — N2581 Secondary hyperparathyroidism of renal origin: Secondary | ICD-10-CM | POA: Diagnosis not present

## 2019-10-17 DIAGNOSIS — E1129 Type 2 diabetes mellitus with other diabetic kidney complication: Secondary | ICD-10-CM | POA: Diagnosis not present

## 2019-10-17 DIAGNOSIS — D631 Anemia in chronic kidney disease: Secondary | ICD-10-CM | POA: Diagnosis not present

## 2019-10-17 DIAGNOSIS — D509 Iron deficiency anemia, unspecified: Secondary | ICD-10-CM | POA: Diagnosis not present

## 2019-10-17 DIAGNOSIS — N186 End stage renal disease: Secondary | ICD-10-CM | POA: Diagnosis not present

## 2019-10-17 DIAGNOSIS — Z992 Dependence on renal dialysis: Secondary | ICD-10-CM | POA: Diagnosis not present

## 2019-10-20 DIAGNOSIS — D509 Iron deficiency anemia, unspecified: Secondary | ICD-10-CM | POA: Diagnosis not present

## 2019-10-20 DIAGNOSIS — N2581 Secondary hyperparathyroidism of renal origin: Secondary | ICD-10-CM | POA: Diagnosis not present

## 2019-10-20 DIAGNOSIS — Z992 Dependence on renal dialysis: Secondary | ICD-10-CM | POA: Diagnosis not present

## 2019-10-20 DIAGNOSIS — D631 Anemia in chronic kidney disease: Secondary | ICD-10-CM | POA: Diagnosis not present

## 2019-10-20 DIAGNOSIS — E1129 Type 2 diabetes mellitus with other diabetic kidney complication: Secondary | ICD-10-CM | POA: Diagnosis not present

## 2019-10-20 DIAGNOSIS — N186 End stage renal disease: Secondary | ICD-10-CM | POA: Diagnosis not present

## 2019-10-22 DIAGNOSIS — E1129 Type 2 diabetes mellitus with other diabetic kidney complication: Secondary | ICD-10-CM | POA: Diagnosis not present

## 2019-10-22 DIAGNOSIS — D631 Anemia in chronic kidney disease: Secondary | ICD-10-CM | POA: Diagnosis not present

## 2019-10-22 DIAGNOSIS — N2581 Secondary hyperparathyroidism of renal origin: Secondary | ICD-10-CM | POA: Diagnosis not present

## 2019-10-22 DIAGNOSIS — Z992 Dependence on renal dialysis: Secondary | ICD-10-CM | POA: Diagnosis not present

## 2019-10-22 DIAGNOSIS — N186 End stage renal disease: Secondary | ICD-10-CM | POA: Diagnosis not present

## 2019-10-22 DIAGNOSIS — D509 Iron deficiency anemia, unspecified: Secondary | ICD-10-CM | POA: Diagnosis not present

## 2019-10-24 DIAGNOSIS — Z992 Dependence on renal dialysis: Secondary | ICD-10-CM | POA: Diagnosis not present

## 2019-10-24 DIAGNOSIS — D631 Anemia in chronic kidney disease: Secondary | ICD-10-CM | POA: Diagnosis not present

## 2019-10-24 DIAGNOSIS — N186 End stage renal disease: Secondary | ICD-10-CM | POA: Diagnosis not present

## 2019-10-24 DIAGNOSIS — N2581 Secondary hyperparathyroidism of renal origin: Secondary | ICD-10-CM | POA: Diagnosis not present

## 2019-10-24 DIAGNOSIS — D509 Iron deficiency anemia, unspecified: Secondary | ICD-10-CM | POA: Diagnosis not present

## 2019-10-24 DIAGNOSIS — E1129 Type 2 diabetes mellitus with other diabetic kidney complication: Secondary | ICD-10-CM | POA: Diagnosis not present

## 2019-10-26 ENCOUNTER — Other Ambulatory Visit: Payer: Medicare Other | Admitting: Orthotics

## 2019-10-26 ENCOUNTER — Other Ambulatory Visit: Payer: Self-pay | Admitting: *Deleted

## 2019-10-26 DIAGNOSIS — I70212 Atherosclerosis of native arteries of extremities with intermittent claudication, left leg: Secondary | ICD-10-CM

## 2019-10-27 DIAGNOSIS — E1129 Type 2 diabetes mellitus with other diabetic kidney complication: Secondary | ICD-10-CM | POA: Diagnosis not present

## 2019-10-27 DIAGNOSIS — D631 Anemia in chronic kidney disease: Secondary | ICD-10-CM | POA: Diagnosis not present

## 2019-10-27 DIAGNOSIS — N2581 Secondary hyperparathyroidism of renal origin: Secondary | ICD-10-CM | POA: Diagnosis not present

## 2019-10-27 DIAGNOSIS — N186 End stage renal disease: Secondary | ICD-10-CM | POA: Diagnosis not present

## 2019-10-27 DIAGNOSIS — Z992 Dependence on renal dialysis: Secondary | ICD-10-CM | POA: Diagnosis not present

## 2019-10-27 DIAGNOSIS — D509 Iron deficiency anemia, unspecified: Secondary | ICD-10-CM | POA: Diagnosis not present

## 2019-10-29 DIAGNOSIS — N2581 Secondary hyperparathyroidism of renal origin: Secondary | ICD-10-CM | POA: Diagnosis not present

## 2019-10-29 DIAGNOSIS — N186 End stage renal disease: Secondary | ICD-10-CM | POA: Diagnosis not present

## 2019-10-29 DIAGNOSIS — E1129 Type 2 diabetes mellitus with other diabetic kidney complication: Secondary | ICD-10-CM | POA: Diagnosis not present

## 2019-10-29 DIAGNOSIS — Z992 Dependence on renal dialysis: Secondary | ICD-10-CM | POA: Diagnosis not present

## 2019-10-29 DIAGNOSIS — D509 Iron deficiency anemia, unspecified: Secondary | ICD-10-CM | POA: Diagnosis not present

## 2019-10-29 DIAGNOSIS — D631 Anemia in chronic kidney disease: Secondary | ICD-10-CM | POA: Diagnosis not present

## 2019-10-30 ENCOUNTER — Other Ambulatory Visit: Payer: Self-pay

## 2019-10-30 ENCOUNTER — Ambulatory Visit
Admission: RE | Admit: 2019-10-30 | Discharge: 2019-10-30 | Disposition: A | Discharge status: Home / Self Care | Payer: Medicare Other | Source: Ambulatory Visit | Attending: Family Medicine | Admitting: Family Medicine

## 2019-10-30 DIAGNOSIS — Z1231 Encounter for screening mammogram for malignant neoplasm of breast: Secondary | ICD-10-CM

## 2019-10-30 DIAGNOSIS — N186 End stage renal disease: Secondary | ICD-10-CM | POA: Diagnosis not present

## 2019-10-30 DIAGNOSIS — Z992 Dependence on renal dialysis: Secondary | ICD-10-CM | POA: Diagnosis not present

## 2019-10-30 DIAGNOSIS — E1122 Type 2 diabetes mellitus with diabetic chronic kidney disease: Secondary | ICD-10-CM | POA: Diagnosis not present

## 2019-10-31 DIAGNOSIS — N186 End stage renal disease: Secondary | ICD-10-CM | POA: Diagnosis not present

## 2019-10-31 DIAGNOSIS — D631 Anemia in chronic kidney disease: Secondary | ICD-10-CM | POA: Diagnosis not present

## 2019-10-31 DIAGNOSIS — D509 Iron deficiency anemia, unspecified: Secondary | ICD-10-CM | POA: Diagnosis not present

## 2019-10-31 DIAGNOSIS — N2581 Secondary hyperparathyroidism of renal origin: Secondary | ICD-10-CM | POA: Diagnosis not present

## 2019-10-31 DIAGNOSIS — Z992 Dependence on renal dialysis: Secondary | ICD-10-CM | POA: Diagnosis not present

## 2019-10-31 DIAGNOSIS — E1129 Type 2 diabetes mellitus with other diabetic kidney complication: Secondary | ICD-10-CM | POA: Diagnosis not present

## 2019-11-03 DIAGNOSIS — D631 Anemia in chronic kidney disease: Secondary | ICD-10-CM | POA: Diagnosis not present

## 2019-11-03 DIAGNOSIS — N2581 Secondary hyperparathyroidism of renal origin: Secondary | ICD-10-CM | POA: Diagnosis not present

## 2019-11-03 DIAGNOSIS — N186 End stage renal disease: Secondary | ICD-10-CM | POA: Diagnosis not present

## 2019-11-03 DIAGNOSIS — D509 Iron deficiency anemia, unspecified: Secondary | ICD-10-CM | POA: Diagnosis not present

## 2019-11-03 DIAGNOSIS — E1129 Type 2 diabetes mellitus with other diabetic kidney complication: Secondary | ICD-10-CM | POA: Diagnosis not present

## 2019-11-03 DIAGNOSIS — Z992 Dependence on renal dialysis: Secondary | ICD-10-CM | POA: Diagnosis not present

## 2019-11-05 DIAGNOSIS — N186 End stage renal disease: Secondary | ICD-10-CM | POA: Diagnosis not present

## 2019-11-05 DIAGNOSIS — D631 Anemia in chronic kidney disease: Secondary | ICD-10-CM | POA: Diagnosis not present

## 2019-11-05 DIAGNOSIS — Z992 Dependence on renal dialysis: Secondary | ICD-10-CM | POA: Diagnosis not present

## 2019-11-05 DIAGNOSIS — D509 Iron deficiency anemia, unspecified: Secondary | ICD-10-CM | POA: Diagnosis not present

## 2019-11-05 DIAGNOSIS — N2581 Secondary hyperparathyroidism of renal origin: Secondary | ICD-10-CM | POA: Diagnosis not present

## 2019-11-05 DIAGNOSIS — E1129 Type 2 diabetes mellitus with other diabetic kidney complication: Secondary | ICD-10-CM | POA: Diagnosis not present

## 2019-11-07 DIAGNOSIS — D631 Anemia in chronic kidney disease: Secondary | ICD-10-CM | POA: Diagnosis not present

## 2019-11-07 DIAGNOSIS — D509 Iron deficiency anemia, unspecified: Secondary | ICD-10-CM | POA: Diagnosis not present

## 2019-11-07 DIAGNOSIS — Z992 Dependence on renal dialysis: Secondary | ICD-10-CM | POA: Diagnosis not present

## 2019-11-07 DIAGNOSIS — N186 End stage renal disease: Secondary | ICD-10-CM | POA: Diagnosis not present

## 2019-11-07 DIAGNOSIS — N2581 Secondary hyperparathyroidism of renal origin: Secondary | ICD-10-CM | POA: Diagnosis not present

## 2019-11-07 DIAGNOSIS — E1129 Type 2 diabetes mellitus with other diabetic kidney complication: Secondary | ICD-10-CM | POA: Diagnosis not present

## 2019-11-09 ENCOUNTER — Ambulatory Visit (INDEPENDENT_AMBULATORY_CARE_PROVIDER_SITE_OTHER): Payer: Medicare Other | Admitting: Physician Assistant

## 2019-11-09 ENCOUNTER — Other Ambulatory Visit: Payer: Self-pay

## 2019-11-09 ENCOUNTER — Ambulatory Visit (HOSPITAL_COMMUNITY)
Admission: RE | Admit: 2019-11-09 | Discharge: 2019-11-09 | Disposition: A | Discharge status: Home / Self Care | Payer: Medicare Other | Source: Ambulatory Visit | Attending: Surgery | Admitting: Surgery

## 2019-11-09 VITALS — BP 165/77 | HR 100 | Temp 98.0°F | Resp 20 | Ht 63.0 in | Wt 140.0 lb

## 2019-11-09 DIAGNOSIS — I779 Disorder of arteries and arterioles, unspecified: Secondary | ICD-10-CM

## 2019-11-09 DIAGNOSIS — I70212 Atherosclerosis of native arteries of extremities with intermittent claudication, left leg: Secondary | ICD-10-CM | POA: Diagnosis not present

## 2019-11-09 NOTE — Progress Notes (Signed)
Office Note     CC:  follow up Requesting Provider:  [name], MD  HPI: Julie Brewer is a 69 y.o. (July 30, 1950) female who presents annual follow-up of peripheral arterial disease as exhibited by left calf claudication initially in 2011.  She was treated with cilostazol with improvement.  She has had no prior lower extremity intervention.  Her daughter accompanies her today.  She denies lower extremity pain with exercise and denies rest pain.  History of end-stage renal disease status post right upper extremity AV graft by Dr. Scot Dock April 27, 2019.  The patient states she had to undergo balloon angioplasty of her graft and her daughter believes it was performed at [facility].  The patient denies any difficulty with her graft currently.  Patient is medically managed for hypertension.  She takes a statin for hypercholesterolemia.  She is a diabetic and continues to smoke proximately 8 cigarettes daily..  She has a history of DVT in the 1970s, thought to be secondary to birth control.  She is blind. Past Medical History:  Diagnosis Date  . Asthma    No probnlems recently  . 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 CHF (congestive heart failure) ([place])   . Chronic kidney disease    Tu/Th/Sa  . Diabetes mellitus    Type II per Dr [name]  (patient said type I)  . Fibroid   . GERD (gastroesophageal reflux disease)   . Glaucoma   . Hyperlipidemia   . Hypertension   . Iron deficiency anemia 03/09/2016  . Peripheral vascular disease ([place])   . Pneumonia 2006  . PONV (postoperative nausea and vomiting)   . Shortness of breath dyspnea    with exdrtion, "Walkling too fast"  . Stroke [facility])    no residual    Past Surgical History:  Procedure Laterality Date  . ABDOMINAL HYSTERECTOMY    . AV FISTULA PLACEMENT Left 04/27/2019   Procedure: INSERTION OF ARTERIOVENOUS (AV) GORE-TEX GRAFT LEFT UPPER ARM;  Surgeon: Angelia Mould, MD;  Location: [place];  Service: Vascular;  Laterality: Left;  . BIOPSY  06/10/2017   Procedure: BIOPSY;  Surgeon: Ronnette Juniper, MD;  Location: [place];  Service: Gastroenterology;;  . BREAST BIOPSY Right   . CERVICAL FUSION     with graft from hip  . COLONOSCOPY  July 09, 2012  . DIRECT LARYNGOSCOPY N/A 06/07/2014   Procedure: DIRECT LARYNGOSCOPY with BIOPSY and EXCISION VOLLECULAR CYST;  Surgeon: Ruby Cola, MD;  [facility];  Service: ENT;  Laterality: N/A;  . ESOPHAGOGASTRODUODENOSCOPY (EGD) WITH PROPOFOL Left 06/10/2017   Procedure: ESOPHAGOGASTRODUODENOSCOPY (EGD) WITH PROPOFOL;  Surgeon: Ronnette Juniper, MD;  Location: [place];  Service: Gastroenterology;  Laterality: Left;  . FLEXIBLE SIGMOIDOSCOPY Left 06/10/2017   Procedure: FLEXIBLE SIGMOIDOSCOPY;  Surgeon: Ronnette Juniper, MD;  Location: [place];  Service: Gastroenterology;  Laterality: Left;  . LOOP RECORDER INSERTION N/A 06/20/2016   Procedure: Loop Recorder Insertion;  Surgeon: [name], MD;  Location: [place] [facility];  Service: Cardiovascular;  Laterality: N/A;  . REFRACTIVE SURGERY Bilateral    both eyes  . SPINE SURGERY     lumbar    Social History   Socioeconomic History  . Marital status: Legally Separated    Spouse name: Not on file  . Number of children: Not on file  . Years of education: Not on file  . Highest education level: Not on  file  Occupational History  . Not on file  Tobacco Use  . Smoking status: Current Every Day Smoker    Packs/day: 1.00    Years: 30.00    Pack years: 30.00    Types: Cigarettes  . Smokeless tobacco: Never Used  Vaping Use  . Vaping Use: Never used  Substance and Sexual Activity  . Alcohol use: Not Currently    Comment: socially  . Drug use: Not Currently    Types: Methylphenidate  . Sexual activity: Never    Birth control/protection: Post-menopausal, Surgical    Comment: Hysterectomy  Other Topics Concern  . Not on file  Social History  Narrative  . Not on file   Social Determinants of Health   Financial Resource Strain:   . Difficulty of Paying Living Expenses: Not on file  Food Insecurity:   . Worried About Charity fundraiser in the Last Year: Not on file  . Ran Out of Food in the Last Year: Not on file  Transportation Needs:   . Lack of Transportation (Medical): Not on file  . Lack of Transportation (Non-Medical): Not on file  Physical Activity:   . Days of Exercise per Week: Not on file  . Minutes of Exercise per Session: Not on file  Stress:   . Feeling of Stress : Not on file  Social Connections:   . Frequency of Communication with Friends and Family: Not on file  . Frequency of Social Gatherings with Friends and Family: Not on file  . Attends Religious Services: Not on file  . Active Member of Clubs or Organizations: Not on file  . Attends Archivist Meetings: Not on file  . Marital Status: Not on file  Intimate Partner Violence:   . Fear of Current or Ex-Partner: Not on file  . Emotionally Abused: Not on file  . Physically Abused: Not on file  . Sexually Abused: Not on file   Family History  Problem Relation Age of Onset  . Cancer Mother   . Heart disease Mother   . Diabetes Father   . Cancer Brother   . Cancer Brother   . Throat cancer Brother     Current Outpatient Medications  Medication Sig Dispense Refill  . acetaminophen (TYLENOL) 325 MG tablet Take 2 tablets (650 mg total) by mouth every 6 (six) hours as needed for mild pain (or Fever >/= 101).    Marland Kitchen amLODipine (NORVASC) 10 MG tablet SMARTSIG:.5 Tablet(s) By Mouth Daily    . aspirin 325 MG EC tablet Take 325 mg by mouth daily.     Marland Kitchen atorvastatin (LIPITOR) 20 MG tablet Take 1 tablet by mouth once daily 90 tablet 0  . bisacodyl (DULCOLAX) 5 MG EC tablet Take 5 mg by mouth daily as needed for moderate constipation.     . calcitRIOL (ROCALTROL) 0.5 MCG capsule Take by mouth.    . cilostazol (PLETAL) 100 MG tablet Take 1 tablet  (100 mg total) by mouth 2 (two) times daily. 180 tablet 1  . docusate sodium (COLACE) 100 MG capsule Take 200 mg by mouth daily.     . ferrous sulfate 325 (65 FE) MG tablet Take 650 mg by mouth daily.    Marland Kitchen gabapentin (NEURONTIN) 100 MG capsule TAKE 1 CAPSULE BY MOUTH TWICE DAILY AS NEEDED 180 capsule 0  . glucose blood (FREESTYLE LITE) test strip USE AS INSTRUCTED TO CHECK BLOOD SUGAR ONCE DAILY. 50 each 3  . insulin glargine (LANTUS) 100 UNIT/ML injection  Inject 14 Units into the skin daily. Inject 14 units under the skin once daily.    . insulin regular (NOVOLIN R) 100 units/mL injection Inject into the skin 2 (two) times daily before a meal. Inject 10 units under the skin before breakfast and 5 units before dinner.    . lidocaine (XYLOCAINE) 2 % injection Inject into the skin.     Marland Kitchen lidocaine-prilocaine (EMLA) cream SMARTSIG:Sparingly Topical    . Methoxy PEG-Epoetin Beta (MIRCERA IJ) Mircera    . omeprazole (PRILOSEC) 20 MG capsule TAKE 1 CAPSULE BY MOUTH ONCE DAILY ( DUE FOR OFFICE VISIT THIS MONTH) 90 capsule 0  . oxyCODONE (ROXICODONE) 5 MG immediate release tablet Take 1 tablet (5 mg total) by mouth every 4 (four) hours as needed. 15 tablet 0  . Vitamin D, Ergocalciferol, (DRISDOL) 1.25 MG (50000 UNIT) CAPS capsule Take 1 capsule by mouth once a week 12 capsule 0   No current facility-administered medications for this visit.    Allergies  Allergen Reactions  . Morphine And Related Other (See Comments)    Hallucinations   . Penicillins Swelling and Rash    Throat swelling Did it involve swelling of the face/tongue/throat, SOB, or low BP? Yes Did it involve sudden or severe rash/hives, skin peeling, or any reaction on the inside of your mouth or nose? Yes Did you need to seek medical attention at a hospital or doctor's office? Yes When did it last happen?young child If all above answers are "NO", may proceed with cephalosporin use.     REVIEW OF SYSTEMS:   [X]  denotes  positive finding, [ ]  denotes negative finding Cardiac  Comments:  Chest pain or chest pressure:    Shortness of breath upon exertion:    Short of breath when lying flat:    Irregular heart rhythm:        Vascular    Pain in calf, thigh, or hip brought on by ambulation:    Pain in feet at night that wakes you up from your sleep:     Blood clot in your veins:    Leg swelling:         Pulmonary    Oxygen at home:    Productive cough:     Wheezing:         Neurologic    Sudden weakness in arms or legs:     Sudden numbness in arms or legs:     Sudden onset of difficulty speaking or slurred speech:    Temporary loss of vision in one eye:     Problems with dizziness:         Gastrointestinal    Blood in stool:     Vomited blood:         Genitourinary    Burning when urinating:     Blood in urine:        Psychiatric    Major depression:         Hematologic    Bleeding problems:    Problems with blood clotting too easily:        Skin    Rashes or ulcers:        Constitutional    Fever or chills:      PHYSICAL EXAMINATION:  Vitals:   11/09/19 1132  BP: (!) 165/77  Pulse: 100  Resp: 20  Temp: 98 F (36.7 C)  TempSrc: Temporal  SpO2: 97%  Weight: 140 lb (63.5 kg)  Height: 5\' 3"  (1.6 m)  General:  WDWN in NAD; vital signs documented above Gait: Not observed HENT: WNL, normocephalic Pulmonary: normal non-labored breathing , without Rales, rhonchi,  wheezing Cardiac: regular HR, without  Murmurs without carotid bruit Abdomen: soft, NT, no masses Skin: without rashes Vascular Exam/Pulses: The patient has 2+ brachial, radial, posterior tibial and dorsalis pedis pulses bilaterally.  Unable to palpate her popliteal pulses. Extremities: without ischemic changes, without Gangrene , without cellulitis; without open wounds;  Musculoskeletal: no muscle wasting or atrophy  Neurologic: A&O X 3;  No focal weakness or paresthesias are detected Psychiatric:  The pt has  Normal affect.   Non-Invasive Vascular Imaging:   November 09, 2019 ABI/TBIToday's ABIToday's TBIPrevious ABIPrevious TBI  +-------+-----------+-----------+------------+------------+  Right 1.06    0.81    0.97    0.99      +-------+-----------+-----------+------------+------------+  Left  0.72    0.61    0.62    0.60      +-------+-----------+-----------+------------+------------+  The patient's right great toe pressure is 145 and her waveforms are biphasic and triphasic. Her left great toe pressure is 109 and her waveforms are biphasic.   ASSESSMENT/PLAN:: 69 y.o. female here for follow up for peripheral arterial disease.  She has moderate left lower extremity arterial disease, however her ABIs are slightly improved today as compared to August 2021. She is asymptomatic on cilostazol.  Her pedal pulses are palpable.  She is compliant with her statin and aspirin.  She states her diabetes is well controlled.  She continues to work on smoking cessation.  Encourage continue walking program.  Follow-up in 1 year with repeat ABIs.  I have encouraged her to call our office sooner should she develop lower extremity pain or nonhealing skin issues.   Barbie Banner, PA-C [facility] [phone]  Clinic MD:   Trula Slade

## 2019-11-10 DIAGNOSIS — N186 End stage renal disease: Secondary | ICD-10-CM | POA: Diagnosis not present

## 2019-11-10 DIAGNOSIS — Z992 Dependence on renal dialysis: Secondary | ICD-10-CM | POA: Diagnosis not present

## 2019-11-10 DIAGNOSIS — D509 Iron deficiency anemia, unspecified: Secondary | ICD-10-CM | POA: Diagnosis not present

## 2019-11-10 DIAGNOSIS — N2581 Secondary hyperparathyroidism of renal origin: Secondary | ICD-10-CM | POA: Diagnosis not present

## 2019-11-10 DIAGNOSIS — D631 Anemia in chronic kidney disease: Secondary | ICD-10-CM | POA: Diagnosis not present

## 2019-11-10 DIAGNOSIS — E1129 Type 2 diabetes mellitus with other diabetic kidney complication: Secondary | ICD-10-CM | POA: Diagnosis not present

## 2019-11-12 DIAGNOSIS — D509 Iron deficiency anemia, unspecified: Secondary | ICD-10-CM | POA: Diagnosis not present

## 2019-11-12 DIAGNOSIS — E1129 Type 2 diabetes mellitus with other diabetic kidney complication: Secondary | ICD-10-CM | POA: Diagnosis not present

## 2019-11-12 DIAGNOSIS — N186 End stage renal disease: Secondary | ICD-10-CM | POA: Diagnosis not present

## 2019-11-12 DIAGNOSIS — N2581 Secondary hyperparathyroidism of renal origin: Secondary | ICD-10-CM | POA: Diagnosis not present

## 2019-11-12 DIAGNOSIS — Z992 Dependence on renal dialysis: Secondary | ICD-10-CM | POA: Diagnosis not present

## 2019-11-12 DIAGNOSIS — D631 Anemia in chronic kidney disease: Secondary | ICD-10-CM | POA: Diagnosis not present

## 2019-11-14 DIAGNOSIS — E1129 Type 2 diabetes mellitus with other diabetic kidney complication: Secondary | ICD-10-CM | POA: Diagnosis not present

## 2019-11-14 DIAGNOSIS — N2581 Secondary hyperparathyroidism of renal origin: Secondary | ICD-10-CM | POA: Diagnosis not present

## 2019-11-14 DIAGNOSIS — N186 End stage renal disease: Secondary | ICD-10-CM | POA: Diagnosis not present

## 2019-11-14 DIAGNOSIS — Z992 Dependence on renal dialysis: Secondary | ICD-10-CM | POA: Diagnosis not present

## 2019-11-14 DIAGNOSIS — D631 Anemia in chronic kidney disease: Secondary | ICD-10-CM | POA: Diagnosis not present

## 2019-11-14 DIAGNOSIS — D509 Iron deficiency anemia, unspecified: Secondary | ICD-10-CM | POA: Diagnosis not present

## 2019-11-17 DIAGNOSIS — N2581 Secondary hyperparathyroidism of renal origin: Secondary | ICD-10-CM | POA: Diagnosis not present

## 2019-11-17 DIAGNOSIS — D509 Iron deficiency anemia, unspecified: Secondary | ICD-10-CM | POA: Diagnosis not present

## 2019-11-17 DIAGNOSIS — E1129 Type 2 diabetes mellitus with other diabetic kidney complication: Secondary | ICD-10-CM | POA: Diagnosis not present

## 2019-11-17 DIAGNOSIS — N186 End stage renal disease: Secondary | ICD-10-CM | POA: Diagnosis not present

## 2019-11-17 DIAGNOSIS — Z992 Dependence on renal dialysis: Secondary | ICD-10-CM | POA: Diagnosis not present

## 2019-11-17 DIAGNOSIS — D631 Anemia in chronic kidney disease: Secondary | ICD-10-CM | POA: Diagnosis not present

## 2019-11-19 DIAGNOSIS — E1129 Type 2 diabetes mellitus with other diabetic kidney complication: Secondary | ICD-10-CM | POA: Diagnosis not present

## 2019-11-19 DIAGNOSIS — D509 Iron deficiency anemia, unspecified: Secondary | ICD-10-CM | POA: Diagnosis not present

## 2019-11-19 DIAGNOSIS — N186 End stage renal disease: Secondary | ICD-10-CM | POA: Diagnosis not present

## 2019-11-19 DIAGNOSIS — Z23 Encounter for immunization: Secondary | ICD-10-CM | POA: Diagnosis not present

## 2019-11-19 DIAGNOSIS — D631 Anemia in chronic kidney disease: Secondary | ICD-10-CM | POA: Diagnosis not present

## 2019-11-19 DIAGNOSIS — N2581 Secondary hyperparathyroidism of renal origin: Secondary | ICD-10-CM | POA: Diagnosis not present

## 2019-11-19 DIAGNOSIS — Z992 Dependence on renal dialysis: Secondary | ICD-10-CM | POA: Diagnosis not present

## 2019-11-21 DIAGNOSIS — D509 Iron deficiency anemia, unspecified: Secondary | ICD-10-CM | POA: Diagnosis not present

## 2019-11-21 DIAGNOSIS — Z992 Dependence on renal dialysis: Secondary | ICD-10-CM | POA: Diagnosis not present

## 2019-11-21 DIAGNOSIS — N186 End stage renal disease: Secondary | ICD-10-CM | POA: Diagnosis not present

## 2019-11-21 DIAGNOSIS — N2581 Secondary hyperparathyroidism of renal origin: Secondary | ICD-10-CM | POA: Diagnosis not present

## 2019-11-21 DIAGNOSIS — E1129 Type 2 diabetes mellitus with other diabetic kidney complication: Secondary | ICD-10-CM | POA: Diagnosis not present

## 2019-11-21 DIAGNOSIS — D631 Anemia in chronic kidney disease: Secondary | ICD-10-CM | POA: Diagnosis not present

## 2019-11-24 DIAGNOSIS — N2581 Secondary hyperparathyroidism of renal origin: Secondary | ICD-10-CM | POA: Diagnosis not present

## 2019-11-24 DIAGNOSIS — N186 End stage renal disease: Secondary | ICD-10-CM | POA: Diagnosis not present

## 2019-11-24 DIAGNOSIS — D631 Anemia in chronic kidney disease: Secondary | ICD-10-CM | POA: Diagnosis not present

## 2019-11-24 DIAGNOSIS — Z992 Dependence on renal dialysis: Secondary | ICD-10-CM | POA: Diagnosis not present

## 2019-11-24 DIAGNOSIS — E1129 Type 2 diabetes mellitus with other diabetic kidney complication: Secondary | ICD-10-CM | POA: Diagnosis not present

## 2019-11-24 DIAGNOSIS — D509 Iron deficiency anemia, unspecified: Secondary | ICD-10-CM | POA: Diagnosis not present

## 2019-11-25 ENCOUNTER — Other Ambulatory Visit: Payer: Medicare Other | Admitting: Orthotics

## 2019-11-26 DIAGNOSIS — Z992 Dependence on renal dialysis: Secondary | ICD-10-CM | POA: Diagnosis not present

## 2019-11-26 DIAGNOSIS — E1129 Type 2 diabetes mellitus with other diabetic kidney complication: Secondary | ICD-10-CM | POA: Diagnosis not present

## 2019-11-26 DIAGNOSIS — D509 Iron deficiency anemia, unspecified: Secondary | ICD-10-CM | POA: Diagnosis not present

## 2019-11-26 DIAGNOSIS — N2581 Secondary hyperparathyroidism of renal origin: Secondary | ICD-10-CM | POA: Diagnosis not present

## 2019-11-26 DIAGNOSIS — D631 Anemia in chronic kidney disease: Secondary | ICD-10-CM | POA: Diagnosis not present

## 2019-11-26 DIAGNOSIS — N186 End stage renal disease: Secondary | ICD-10-CM | POA: Diagnosis not present

## 2019-11-30 DIAGNOSIS — E1122 Type 2 diabetes mellitus with diabetic chronic kidney disease: Secondary | ICD-10-CM | POA: Diagnosis not present

## 2019-11-30 DIAGNOSIS — Z992 Dependence on renal dialysis: Secondary | ICD-10-CM | POA: Diagnosis not present

## 2019-11-30 DIAGNOSIS — N186 End stage renal disease: Secondary | ICD-10-CM | POA: Diagnosis not present

## 2019-12-01 DIAGNOSIS — Z992 Dependence on renal dialysis: Secondary | ICD-10-CM | POA: Diagnosis not present

## 2019-12-01 DIAGNOSIS — N186 End stage renal disease: Secondary | ICD-10-CM | POA: Diagnosis not present

## 2019-12-01 DIAGNOSIS — E1129 Type 2 diabetes mellitus with other diabetic kidney complication: Secondary | ICD-10-CM | POA: Diagnosis not present

## 2019-12-01 DIAGNOSIS — D509 Iron deficiency anemia, unspecified: Secondary | ICD-10-CM | POA: Diagnosis not present

## 2019-12-01 DIAGNOSIS — N2581 Secondary hyperparathyroidism of renal origin: Secondary | ICD-10-CM | POA: Diagnosis not present

## 2019-12-03 DIAGNOSIS — N186 End stage renal disease: Secondary | ICD-10-CM | POA: Diagnosis not present

## 2019-12-03 DIAGNOSIS — Z992 Dependence on renal dialysis: Secondary | ICD-10-CM | POA: Diagnosis not present

## 2019-12-03 DIAGNOSIS — E1129 Type 2 diabetes mellitus with other diabetic kidney complication: Secondary | ICD-10-CM | POA: Diagnosis not present

## 2019-12-03 DIAGNOSIS — N2581 Secondary hyperparathyroidism of renal origin: Secondary | ICD-10-CM | POA: Diagnosis not present

## 2019-12-03 DIAGNOSIS — D509 Iron deficiency anemia, unspecified: Secondary | ICD-10-CM | POA: Diagnosis not present

## 2019-12-04 DIAGNOSIS — Z23 Encounter for immunization: Secondary | ICD-10-CM | POA: Diagnosis not present

## 2019-12-05 DIAGNOSIS — R42 Dizziness and giddiness: Secondary | ICD-10-CM | POA: Diagnosis not present

## 2019-12-05 DIAGNOSIS — R55 Syncope and collapse: Secondary | ICD-10-CM | POA: Diagnosis not present

## 2019-12-05 DIAGNOSIS — R000 Tachycardia, unspecified: Secondary | ICD-10-CM | POA: Diagnosis not present

## 2019-12-05 DIAGNOSIS — R569 Unspecified convulsions: Secondary | ICD-10-CM | POA: Diagnosis not present

## 2019-12-05 DIAGNOSIS — R0902 Hypoxemia: Secondary | ICD-10-CM | POA: Diagnosis not present

## 2019-12-07 ENCOUNTER — Other Ambulatory Visit: Payer: Medicare Other | Admitting: Orthotics

## 2019-12-08 ENCOUNTER — Other Ambulatory Visit: Payer: Self-pay | Admitting: Endocrinology

## 2019-12-08 DIAGNOSIS — Z992 Dependence on renal dialysis: Secondary | ICD-10-CM | POA: Diagnosis not present

## 2019-12-08 DIAGNOSIS — N186 End stage renal disease: Secondary | ICD-10-CM | POA: Diagnosis not present

## 2019-12-08 DIAGNOSIS — D509 Iron deficiency anemia, unspecified: Secondary | ICD-10-CM | POA: Diagnosis not present

## 2019-12-08 DIAGNOSIS — N2581 Secondary hyperparathyroidism of renal origin: Secondary | ICD-10-CM | POA: Diagnosis not present

## 2019-12-08 DIAGNOSIS — E1129 Type 2 diabetes mellitus with other diabetic kidney complication: Secondary | ICD-10-CM | POA: Diagnosis not present

## 2019-12-10 DIAGNOSIS — E1129 Type 2 diabetes mellitus with other diabetic kidney complication: Secondary | ICD-10-CM | POA: Diagnosis not present

## 2019-12-10 DIAGNOSIS — D509 Iron deficiency anemia, unspecified: Secondary | ICD-10-CM | POA: Diagnosis not present

## 2019-12-10 DIAGNOSIS — Z992 Dependence on renal dialysis: Secondary | ICD-10-CM | POA: Diagnosis not present

## 2019-12-10 DIAGNOSIS — N186 End stage renal disease: Secondary | ICD-10-CM | POA: Diagnosis not present

## 2019-12-10 DIAGNOSIS — N2581 Secondary hyperparathyroidism of renal origin: Secondary | ICD-10-CM | POA: Diagnosis not present

## 2019-12-12 DIAGNOSIS — D509 Iron deficiency anemia, unspecified: Secondary | ICD-10-CM | POA: Diagnosis not present

## 2019-12-12 DIAGNOSIS — N2581 Secondary hyperparathyroidism of renal origin: Secondary | ICD-10-CM | POA: Diagnosis not present

## 2019-12-12 DIAGNOSIS — N186 End stage renal disease: Secondary | ICD-10-CM | POA: Diagnosis not present

## 2019-12-12 DIAGNOSIS — Z992 Dependence on renal dialysis: Secondary | ICD-10-CM | POA: Diagnosis not present

## 2019-12-12 DIAGNOSIS — E1129 Type 2 diabetes mellitus with other diabetic kidney complication: Secondary | ICD-10-CM | POA: Diagnosis not present

## 2019-12-14 ENCOUNTER — Other Ambulatory Visit: Payer: Self-pay

## 2019-12-14 ENCOUNTER — Ambulatory Visit: Payer: Medicare Other | Admitting: Orthotics

## 2019-12-14 DIAGNOSIS — E1151 Type 2 diabetes mellitus with diabetic peripheral angiopathy without gangrene: Secondary | ICD-10-CM

## 2019-12-14 DIAGNOSIS — L84 Corns and callosities: Secondary | ICD-10-CM

## 2019-12-14 DIAGNOSIS — M2042 Other hammer toe(s) (acquired), left foot: Secondary | ICD-10-CM

## 2019-12-14 DIAGNOSIS — M2041 Other hammer toe(s) (acquired), right foot: Secondary | ICD-10-CM

## 2019-12-14 NOTE — Progress Notes (Signed)

## 2019-12-17 DIAGNOSIS — D509 Iron deficiency anemia, unspecified: Secondary | ICD-10-CM | POA: Diagnosis not present

## 2019-12-17 DIAGNOSIS — N2581 Secondary hyperparathyroidism of renal origin: Secondary | ICD-10-CM | POA: Diagnosis not present

## 2019-12-17 DIAGNOSIS — N186 End stage renal disease: Secondary | ICD-10-CM | POA: Diagnosis not present

## 2019-12-17 DIAGNOSIS — E1129 Type 2 diabetes mellitus with other diabetic kidney complication: Secondary | ICD-10-CM | POA: Diagnosis not present

## 2019-12-17 DIAGNOSIS — Z992 Dependence on renal dialysis: Secondary | ICD-10-CM | POA: Diagnosis not present

## 2019-12-19 DIAGNOSIS — N186 End stage renal disease: Secondary | ICD-10-CM | POA: Diagnosis not present

## 2019-12-19 DIAGNOSIS — Z992 Dependence on renal dialysis: Secondary | ICD-10-CM | POA: Diagnosis not present

## 2019-12-19 DIAGNOSIS — N2581 Secondary hyperparathyroidism of renal origin: Secondary | ICD-10-CM | POA: Diagnosis not present

## 2019-12-19 DIAGNOSIS — D509 Iron deficiency anemia, unspecified: Secondary | ICD-10-CM | POA: Diagnosis not present

## 2019-12-19 DIAGNOSIS — E1129 Type 2 diabetes mellitus with other diabetic kidney complication: Secondary | ICD-10-CM | POA: Diagnosis not present

## 2019-12-21 DIAGNOSIS — Z992 Dependence on renal dialysis: Secondary | ICD-10-CM | POA: Diagnosis not present

## 2019-12-21 DIAGNOSIS — N2581 Secondary hyperparathyroidism of renal origin: Secondary | ICD-10-CM | POA: Diagnosis not present

## 2019-12-21 DIAGNOSIS — D509 Iron deficiency anemia, unspecified: Secondary | ICD-10-CM | POA: Diagnosis not present

## 2019-12-21 DIAGNOSIS — N186 End stage renal disease: Secondary | ICD-10-CM | POA: Diagnosis not present

## 2019-12-21 DIAGNOSIS — E1129 Type 2 diabetes mellitus with other diabetic kidney complication: Secondary | ICD-10-CM | POA: Diagnosis not present

## 2019-12-23 DIAGNOSIS — N2581 Secondary hyperparathyroidism of renal origin: Secondary | ICD-10-CM | POA: Diagnosis not present

## 2019-12-23 DIAGNOSIS — Z992 Dependence on renal dialysis: Secondary | ICD-10-CM | POA: Diagnosis not present

## 2019-12-23 DIAGNOSIS — D509 Iron deficiency anemia, unspecified: Secondary | ICD-10-CM | POA: Diagnosis not present

## 2019-12-23 DIAGNOSIS — N186 End stage renal disease: Secondary | ICD-10-CM | POA: Diagnosis not present

## 2019-12-23 DIAGNOSIS — E1129 Type 2 diabetes mellitus with other diabetic kidney complication: Secondary | ICD-10-CM | POA: Diagnosis not present

## 2019-12-26 DIAGNOSIS — E1129 Type 2 diabetes mellitus with other diabetic kidney complication: Secondary | ICD-10-CM | POA: Diagnosis not present

## 2019-12-26 DIAGNOSIS — N186 End stage renal disease: Secondary | ICD-10-CM | POA: Diagnosis not present

## 2019-12-26 DIAGNOSIS — N2581 Secondary hyperparathyroidism of renal origin: Secondary | ICD-10-CM | POA: Diagnosis not present

## 2019-12-26 DIAGNOSIS — D509 Iron deficiency anemia, unspecified: Secondary | ICD-10-CM | POA: Diagnosis not present

## 2019-12-26 DIAGNOSIS — Z992 Dependence on renal dialysis: Secondary | ICD-10-CM | POA: Diagnosis not present

## 2019-12-29 DIAGNOSIS — N186 End stage renal disease: Secondary | ICD-10-CM | POA: Diagnosis not present

## 2019-12-29 DIAGNOSIS — Z992 Dependence on renal dialysis: Secondary | ICD-10-CM | POA: Diagnosis not present

## 2019-12-29 DIAGNOSIS — E1129 Type 2 diabetes mellitus with other diabetic kidney complication: Secondary | ICD-10-CM | POA: Diagnosis not present

## 2019-12-29 DIAGNOSIS — D509 Iron deficiency anemia, unspecified: Secondary | ICD-10-CM | POA: Diagnosis not present

## 2019-12-29 DIAGNOSIS — N2581 Secondary hyperparathyroidism of renal origin: Secondary | ICD-10-CM | POA: Diagnosis not present

## 2019-12-30 ENCOUNTER — Ambulatory Visit: Payer: Medicare Other | Admitting: Podiatry

## 2019-12-30 DIAGNOSIS — N186 End stage renal disease: Secondary | ICD-10-CM | POA: Diagnosis not present

## 2019-12-30 DIAGNOSIS — Z992 Dependence on renal dialysis: Secondary | ICD-10-CM | POA: Diagnosis not present

## 2019-12-30 DIAGNOSIS — E1122 Type 2 diabetes mellitus with diabetic chronic kidney disease: Secondary | ICD-10-CM | POA: Diagnosis not present

## 2019-12-31 DIAGNOSIS — E1129 Type 2 diabetes mellitus with other diabetic kidney complication: Secondary | ICD-10-CM | POA: Diagnosis not present

## 2019-12-31 DIAGNOSIS — Z992 Dependence on renal dialysis: Secondary | ICD-10-CM | POA: Diagnosis not present

## 2019-12-31 DIAGNOSIS — D509 Iron deficiency anemia, unspecified: Secondary | ICD-10-CM | POA: Diagnosis not present

## 2019-12-31 DIAGNOSIS — N186 End stage renal disease: Secondary | ICD-10-CM | POA: Diagnosis not present

## 2019-12-31 DIAGNOSIS — N2581 Secondary hyperparathyroidism of renal origin: Secondary | ICD-10-CM | POA: Diagnosis not present

## 2020-01-01 ENCOUNTER — Other Ambulatory Visit: Payer: Self-pay | Admitting: Endocrinology

## 2020-01-02 DIAGNOSIS — E1129 Type 2 diabetes mellitus with other diabetic kidney complication: Secondary | ICD-10-CM | POA: Diagnosis not present

## 2020-01-02 DIAGNOSIS — Z992 Dependence on renal dialysis: Secondary | ICD-10-CM | POA: Diagnosis not present

## 2020-01-02 DIAGNOSIS — N186 End stage renal disease: Secondary | ICD-10-CM | POA: Diagnosis not present

## 2020-01-02 DIAGNOSIS — D509 Iron deficiency anemia, unspecified: Secondary | ICD-10-CM | POA: Diagnosis not present

## 2020-01-02 DIAGNOSIS — N2581 Secondary hyperparathyroidism of renal origin: Secondary | ICD-10-CM | POA: Diagnosis not present

## 2020-01-04 ENCOUNTER — Other Ambulatory Visit: Payer: Self-pay | Admitting: Endocrinology

## 2020-01-05 DIAGNOSIS — Z992 Dependence on renal dialysis: Secondary | ICD-10-CM | POA: Diagnosis not present

## 2020-01-05 DIAGNOSIS — N2581 Secondary hyperparathyroidism of renal origin: Secondary | ICD-10-CM | POA: Diagnosis not present

## 2020-01-05 DIAGNOSIS — D509 Iron deficiency anemia, unspecified: Secondary | ICD-10-CM | POA: Diagnosis not present

## 2020-01-05 DIAGNOSIS — E1129 Type 2 diabetes mellitus with other diabetic kidney complication: Secondary | ICD-10-CM | POA: Diagnosis not present

## 2020-01-05 DIAGNOSIS — N186 End stage renal disease: Secondary | ICD-10-CM | POA: Diagnosis not present

## 2020-01-07 DIAGNOSIS — D509 Iron deficiency anemia, unspecified: Secondary | ICD-10-CM | POA: Diagnosis not present

## 2020-01-07 DIAGNOSIS — N2581 Secondary hyperparathyroidism of renal origin: Secondary | ICD-10-CM | POA: Diagnosis not present

## 2020-01-07 DIAGNOSIS — Z992 Dependence on renal dialysis: Secondary | ICD-10-CM | POA: Diagnosis not present

## 2020-01-07 DIAGNOSIS — N186 End stage renal disease: Secondary | ICD-10-CM | POA: Diagnosis not present

## 2020-01-07 DIAGNOSIS — E1129 Type 2 diabetes mellitus with other diabetic kidney complication: Secondary | ICD-10-CM | POA: Diagnosis not present

## 2020-01-08 ENCOUNTER — Other Ambulatory Visit: Payer: Self-pay | Admitting: Vascular Surgery

## 2020-01-09 DIAGNOSIS — E1129 Type 2 diabetes mellitus with other diabetic kidney complication: Secondary | ICD-10-CM | POA: Diagnosis not present

## 2020-01-09 DIAGNOSIS — D509 Iron deficiency anemia, unspecified: Secondary | ICD-10-CM | POA: Diagnosis not present

## 2020-01-09 DIAGNOSIS — N186 End stage renal disease: Secondary | ICD-10-CM | POA: Diagnosis not present

## 2020-01-09 DIAGNOSIS — N2581 Secondary hyperparathyroidism of renal origin: Secondary | ICD-10-CM | POA: Diagnosis not present

## 2020-01-09 DIAGNOSIS — Z992 Dependence on renal dialysis: Secondary | ICD-10-CM | POA: Diagnosis not present

## 2020-01-12 DIAGNOSIS — E1129 Type 2 diabetes mellitus with other diabetic kidney complication: Secondary | ICD-10-CM | POA: Diagnosis not present

## 2020-01-12 DIAGNOSIS — N186 End stage renal disease: Secondary | ICD-10-CM | POA: Diagnosis not present

## 2020-01-12 DIAGNOSIS — N2581 Secondary hyperparathyroidism of renal origin: Secondary | ICD-10-CM | POA: Diagnosis not present

## 2020-01-12 DIAGNOSIS — Z992 Dependence on renal dialysis: Secondary | ICD-10-CM | POA: Diagnosis not present

## 2020-01-12 DIAGNOSIS — D509 Iron deficiency anemia, unspecified: Secondary | ICD-10-CM | POA: Diagnosis not present

## 2020-01-14 DIAGNOSIS — N2581 Secondary hyperparathyroidism of renal origin: Secondary | ICD-10-CM | POA: Diagnosis not present

## 2020-01-14 DIAGNOSIS — D509 Iron deficiency anemia, unspecified: Secondary | ICD-10-CM | POA: Diagnosis not present

## 2020-01-14 DIAGNOSIS — N186 End stage renal disease: Secondary | ICD-10-CM | POA: Diagnosis not present

## 2020-01-14 DIAGNOSIS — Z992 Dependence on renal dialysis: Secondary | ICD-10-CM | POA: Diagnosis not present

## 2020-01-14 DIAGNOSIS — E1129 Type 2 diabetes mellitus with other diabetic kidney complication: Secondary | ICD-10-CM | POA: Diagnosis not present

## 2020-01-15 ENCOUNTER — Other Ambulatory Visit: Payer: Self-pay

## 2020-01-15 ENCOUNTER — Ambulatory Visit (INDEPENDENT_AMBULATORY_CARE_PROVIDER_SITE_OTHER): Payer: Medicare Other | Admitting: Podiatry

## 2020-01-15 DIAGNOSIS — B351 Tinea unguium: Secondary | ICD-10-CM

## 2020-01-15 DIAGNOSIS — M79674 Pain in right toe(s): Secondary | ICD-10-CM

## 2020-01-15 DIAGNOSIS — M79675 Pain in left toe(s): Secondary | ICD-10-CM | POA: Diagnosis not present

## 2020-01-15 DIAGNOSIS — E1151 Type 2 diabetes mellitus with diabetic peripheral angiopathy without gangrene: Secondary | ICD-10-CM

## 2020-01-16 DIAGNOSIS — N2581 Secondary hyperparathyroidism of renal origin: Secondary | ICD-10-CM | POA: Diagnosis not present

## 2020-01-16 DIAGNOSIS — Z992 Dependence on renal dialysis: Secondary | ICD-10-CM | POA: Diagnosis not present

## 2020-01-16 DIAGNOSIS — N186 End stage renal disease: Secondary | ICD-10-CM | POA: Diagnosis not present

## 2020-01-16 DIAGNOSIS — E1129 Type 2 diabetes mellitus with other diabetic kidney complication: Secondary | ICD-10-CM | POA: Diagnosis not present

## 2020-01-16 DIAGNOSIS — D509 Iron deficiency anemia, unspecified: Secondary | ICD-10-CM | POA: Diagnosis not present

## 2020-01-19 ENCOUNTER — Encounter: Payer: Self-pay | Admitting: Podiatry

## 2020-01-19 DIAGNOSIS — Z992 Dependence on renal dialysis: Secondary | ICD-10-CM | POA: Diagnosis not present

## 2020-01-19 DIAGNOSIS — N186 End stage renal disease: Secondary | ICD-10-CM | POA: Diagnosis not present

## 2020-01-19 DIAGNOSIS — D509 Iron deficiency anemia, unspecified: Secondary | ICD-10-CM | POA: Diagnosis not present

## 2020-01-19 DIAGNOSIS — E1129 Type 2 diabetes mellitus with other diabetic kidney complication: Secondary | ICD-10-CM | POA: Diagnosis not present

## 2020-01-19 DIAGNOSIS — N2581 Secondary hyperparathyroidism of renal origin: Secondary | ICD-10-CM | POA: Diagnosis not present

## 2020-01-19 NOTE — Progress Notes (Signed)
  Subjective:  Patient ID: LAMONA EIMER, female    DOB: 19-Dec-1950,  MRN: [id number]  Chief Complaint  Patient presents with  . Diabetes    Routine nail care needed    69 y.o. female returns for the above complaint.  Patient is a type II diabetic whose A1c is 6.1.  Patient presents with thickened elongated mycotic toenails x10.  Patient states that she is not able to cut it herself.  She would like to bring [place] to debride them down.  She denies any other acute complaints.  Objective:  There were no vitals filed for this visit. Podiatric Exam: Vascular: dorsalis pedis and posterior tibial pulses are palpable bilateral. Capillary return is immediate. Temperature gradient is WNL. Skin turgor WNL  Sensorium: Normal Semmes Weinstein monofilament test. Normal tactile sensation bilaterally. Nail Exam: Pt has thick disfigured discolored nails with subungual debris noted bilateral entire nail hallux through fifth toenails Ulcer Exam: There is no evidence of ulcer or pre-ulcerative changes or infection. Orthopedic Exam: Muscle tone and strength are WNL. No limitations in general ROM. No crepitus or effusions noted. HAV  B/L.  Hammer toes 2-5  B/L. Skin: No Porokeratosis. No infection or ulcers.  Submet 3 hyperkeratotic lesion/callus x2.  Pain on palpation.  Assessment & Plan:  Patient was evaluated and treated and all questions answered.  Hammertoe contractures bilateral two through five -I explained to patient the etiology of hammertoe contracture as well as treatment options were discussed. Given that patient has underlying diabetes with contractures of the hammertoes I believe patient will benefit from diabetic shoes to prevent ulceration leading to amputation. Patient agrees with the plan. -Patient is currently wearing diabetic shoes.  No acute complaints noted.  Bilateral submet 3 hyperkeratotic lesion/callus x2 -Resolving  Onychomycosis with pain  -Nails palliatively debrided as  below. -Educated on self-care  Procedure: Nail Debridement Rationale: pain  Type of Debridement: manual, sharp debridement. Instrumentation: Nail nipper, rotary burr. Number of Nails: 10  Procedures and Treatment: Consent by patient was obtained for treatment procedures. The patient understood the discussion of treatment and procedures well. All questions were answered thoroughly reviewed. Debridement of mycotic and hypertrophic toenails, 1 through 5 bilateral and clearing of subungual debris. No ulceration, no infection noted.  Return Visit-Office Procedure: Patient instructed to return to the office for a follow up visit 3 months for continued evaluation and treatment.  Boneta Lucks, DPM    No follow-ups on file.

## 2020-01-21 DIAGNOSIS — N186 End stage renal disease: Secondary | ICD-10-CM | POA: Diagnosis not present

## 2020-01-21 DIAGNOSIS — N2581 Secondary hyperparathyroidism of renal origin: Secondary | ICD-10-CM | POA: Diagnosis not present

## 2020-01-21 DIAGNOSIS — E1129 Type 2 diabetes mellitus with other diabetic kidney complication: Secondary | ICD-10-CM | POA: Diagnosis not present

## 2020-01-21 DIAGNOSIS — Z992 Dependence on renal dialysis: Secondary | ICD-10-CM | POA: Diagnosis not present

## 2020-01-21 DIAGNOSIS — D509 Iron deficiency anemia, unspecified: Secondary | ICD-10-CM | POA: Diagnosis not present

## 2020-01-24 DIAGNOSIS — E1129 Type 2 diabetes mellitus with other diabetic kidney complication: Secondary | ICD-10-CM | POA: Diagnosis not present

## 2020-01-24 DIAGNOSIS — D509 Iron deficiency anemia, unspecified: Secondary | ICD-10-CM | POA: Diagnosis not present

## 2020-01-24 DIAGNOSIS — N186 End stage renal disease: Secondary | ICD-10-CM | POA: Diagnosis not present

## 2020-01-24 DIAGNOSIS — Z992 Dependence on renal dialysis: Secondary | ICD-10-CM | POA: Diagnosis not present

## 2020-01-24 DIAGNOSIS — N2581 Secondary hyperparathyroidism of renal origin: Secondary | ICD-10-CM | POA: Diagnosis not present

## 2020-01-26 DIAGNOSIS — N2581 Secondary hyperparathyroidism of renal origin: Secondary | ICD-10-CM | POA: Diagnosis not present

## 2020-01-26 DIAGNOSIS — Z992 Dependence on renal dialysis: Secondary | ICD-10-CM | POA: Diagnosis not present

## 2020-01-26 DIAGNOSIS — E1129 Type 2 diabetes mellitus with other diabetic kidney complication: Secondary | ICD-10-CM | POA: Diagnosis not present

## 2020-01-26 DIAGNOSIS — D509 Iron deficiency anemia, unspecified: Secondary | ICD-10-CM | POA: Diagnosis not present

## 2020-01-26 DIAGNOSIS — N186 End stage renal disease: Secondary | ICD-10-CM | POA: Diagnosis not present

## 2020-01-28 DIAGNOSIS — D509 Iron deficiency anemia, unspecified: Secondary | ICD-10-CM | POA: Diagnosis not present

## 2020-01-28 DIAGNOSIS — Z992 Dependence on renal dialysis: Secondary | ICD-10-CM | POA: Diagnosis not present

## 2020-01-28 DIAGNOSIS — N2581 Secondary hyperparathyroidism of renal origin: Secondary | ICD-10-CM | POA: Diagnosis not present

## 2020-01-28 DIAGNOSIS — E1129 Type 2 diabetes mellitus with other diabetic kidney complication: Secondary | ICD-10-CM | POA: Diagnosis not present

## 2020-01-28 DIAGNOSIS — N186 End stage renal disease: Secondary | ICD-10-CM | POA: Diagnosis not present

## 2020-01-30 DIAGNOSIS — N186 End stage renal disease: Secondary | ICD-10-CM | POA: Diagnosis not present

## 2020-01-30 DIAGNOSIS — E1122 Type 2 diabetes mellitus with diabetic chronic kidney disease: Secondary | ICD-10-CM | POA: Diagnosis not present

## 2020-01-30 DIAGNOSIS — Z992 Dependence on renal dialysis: Secondary | ICD-10-CM | POA: Diagnosis not present

## 2020-01-31 DIAGNOSIS — N2581 Secondary hyperparathyroidism of renal origin: Secondary | ICD-10-CM | POA: Diagnosis not present

## 2020-01-31 DIAGNOSIS — E1129 Type 2 diabetes mellitus with other diabetic kidney complication: Secondary | ICD-10-CM | POA: Diagnosis not present

## 2020-01-31 DIAGNOSIS — N186 End stage renal disease: Secondary | ICD-10-CM | POA: Diagnosis not present

## 2020-01-31 DIAGNOSIS — D509 Iron deficiency anemia, unspecified: Secondary | ICD-10-CM | POA: Diagnosis not present

## 2020-01-31 DIAGNOSIS — Z992 Dependence on renal dialysis: Secondary | ICD-10-CM | POA: Diagnosis not present

## 2020-02-01 ENCOUNTER — Other Ambulatory Visit: Payer: Medicare Other

## 2020-02-02 DIAGNOSIS — E1129 Type 2 diabetes mellitus with other diabetic kidney complication: Secondary | ICD-10-CM | POA: Diagnosis not present

## 2020-02-02 DIAGNOSIS — N186 End stage renal disease: Secondary | ICD-10-CM | POA: Diagnosis not present

## 2020-02-02 DIAGNOSIS — Z992 Dependence on renal dialysis: Secondary | ICD-10-CM | POA: Diagnosis not present

## 2020-02-02 DIAGNOSIS — D509 Iron deficiency anemia, unspecified: Secondary | ICD-10-CM | POA: Diagnosis not present

## 2020-02-02 DIAGNOSIS — N2581 Secondary hyperparathyroidism of renal origin: Secondary | ICD-10-CM | POA: Diagnosis not present

## 2020-02-03 ENCOUNTER — Other Ambulatory Visit (INDEPENDENT_AMBULATORY_CARE_PROVIDER_SITE_OTHER): Payer: Medicare Other

## 2020-02-03 ENCOUNTER — Other Ambulatory Visit: Payer: Self-pay

## 2020-02-03 DIAGNOSIS — E782 Mixed hyperlipidemia: Secondary | ICD-10-CM | POA: Diagnosis not present

## 2020-02-03 DIAGNOSIS — Z794 Long term (current) use of insulin: Secondary | ICD-10-CM | POA: Diagnosis not present

## 2020-02-03 DIAGNOSIS — R55 Syncope and collapse: Secondary | ICD-10-CM | POA: Diagnosis not present

## 2020-02-03 DIAGNOSIS — E1165 Type 2 diabetes mellitus with hyperglycemia: Secondary | ICD-10-CM

## 2020-02-03 LAB — HEMOGLOBIN A1C: Hgb A1c MFr Bld: 7.7 % — ABNORMAL HIGH (ref 4.6–6.5)

## 2020-02-03 LAB — ALT: ALT: 17 U/L (ref 0–35)

## 2020-02-03 LAB — GLUCOSE, RANDOM: Glucose, Bld: 184 mg/dL — ABNORMAL HIGH (ref 70–99)

## 2020-02-03 LAB — LIPID PANEL
Cholesterol: 145 mg/dL (ref 0–200)
HDL: 46.2 mg/dL (ref >39.00)
LDL Cholesterol: 63 mg/dL (ref 0–99)
NonHDL: 98.56
Total CHOL/HDL Ratio: 3
Triglycerides: 177 mg/dL — ABNORMAL HIGH (ref 0.0–149.0)
VLDL: 35.4 mg/dL (ref 0.0–40.0)

## 2020-02-04 DIAGNOSIS — N2581 Secondary hyperparathyroidism of renal origin: Secondary | ICD-10-CM | POA: Diagnosis not present

## 2020-02-04 DIAGNOSIS — N186 End stage renal disease: Secondary | ICD-10-CM | POA: Diagnosis not present

## 2020-02-04 DIAGNOSIS — D509 Iron deficiency anemia, unspecified: Secondary | ICD-10-CM | POA: Diagnosis not present

## 2020-02-04 DIAGNOSIS — E1129 Type 2 diabetes mellitus with other diabetic kidney complication: Secondary | ICD-10-CM | POA: Diagnosis not present

## 2020-02-04 DIAGNOSIS — Z992 Dependence on renal dialysis: Secondary | ICD-10-CM | POA: Diagnosis not present

## 2020-02-04 LAB — FRUCTOSAMINE: Fructosamine: 368 umol/L — ABNORMAL HIGH (ref 0–285)

## 2020-02-05 ENCOUNTER — Ambulatory Visit (INDEPENDENT_AMBULATORY_CARE_PROVIDER_SITE_OTHER): Payer: Medicare Other | Admitting: Neurology

## 2020-02-05 ENCOUNTER — Encounter: Payer: Medicare Other | Attending: Endocrinology | Admitting: Dietician

## 2020-02-05 ENCOUNTER — Encounter: Payer: Self-pay | Admitting: Endocrinology

## 2020-02-05 ENCOUNTER — Ambulatory Visit (INDEPENDENT_AMBULATORY_CARE_PROVIDER_SITE_OTHER): Payer: Medicare Other | Admitting: Endocrinology

## 2020-02-05 ENCOUNTER — Encounter: Payer: Self-pay | Admitting: Dietician

## 2020-02-05 ENCOUNTER — Other Ambulatory Visit: Payer: Self-pay

## 2020-02-05 ENCOUNTER — Encounter: Payer: Self-pay | Admitting: Neurology

## 2020-02-05 ENCOUNTER — Other Ambulatory Visit: Payer: Self-pay | Admitting: *Deleted

## 2020-02-05 VITALS — BP 121/72 | HR 101 | Resp 18 | Wt 138.0 lb

## 2020-02-05 VITALS — BP 118/62 | HR 88 | Ht 63.0 in | Wt 137.2 lb

## 2020-02-05 DIAGNOSIS — E1165 Type 2 diabetes mellitus with hyperglycemia: Secondary | ICD-10-CM | POA: Diagnosis not present

## 2020-02-05 DIAGNOSIS — Z794 Long term (current) use of insulin: Secondary | ICD-10-CM | POA: Diagnosis not present

## 2020-02-05 DIAGNOSIS — E782 Mixed hyperlipidemia: Secondary | ICD-10-CM

## 2020-02-05 DIAGNOSIS — R55 Syncope and collapse: Secondary | ICD-10-CM

## 2020-02-05 MED ORDER — FREESTYLE FREEDOM KIT: PACK | 1 refills | Status: AC

## 2020-02-05 NOTE — Progress Notes (Signed)
Patient ID: Julie Brewer, female   DOB: [birth year], 70 y.o.   MRN: [id number]   Reason for Appointment: Follow-up of various problems  History of Present Illness    Diagnosis: Type 2 DIABETES MELITUS, date of diagnosis:  1985  Prior history: She has been on insulin since diagnosis and on Lantus previously Also at some point had been started on Glucophage several years ago also Because of insurance preference Lantus was changed to Levemir  She refuses to use analog rapid acting insulin because of cost and is using regular insulin for several years   Her blood sugars are generally well controlled and A1c usually under 7% Her A1c previously was higher with stopping metformin at 8%  Recent history:   Insulin regimen: Lantus insulin 14U in the morning daily.  Regular insulin 30 minutes Before eating, 10 units a.m. and 5-7 ac supper  Oral hypoglycemic drugs: None   Current blood sugar patterns, management and problems:  Her A1c is now 7.7 compared to 6.5 Fructosamine is higher at 368 compared to 293   She is likely not checking her sugars much at all, her daughter does not remember when the last time she checked her sugar  She claims that her blood sugars are not high in the morning when checked and usually not over 150  However the lab blood sugar was 184 and blood sugar today fasting again was 194  Not clear why her blood sugars are higher  Her weight is down 3 pounds since her last visit  She has not changed her diet  She is usually getting meat for protein in the morning at breakfast and recently not eating much.  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     Mealtime protein sources:turkey, chicken.  Eating cereal for breakfast Or oatmeal Avoiding sweet drinks  Physical activity: exercise: Some walking within the house              Wt Readings from Last 3 Encounters:  02/05/20 137 lb 3.2 oz (62.2 kg)  02/05/20 138 lb (62.6 kg)  11/09/19 140 lb (63.5 kg)   Lab Results  Component Value Date   HGBA1C 7.7 (H) 02/03/2020   HGBA1C 6.5 09/28/2019   HGBA1C 6.1 (A) 06/24/2019   Lab Results  Component Value Date   MICROALBUR 196.7 (H) 03/11/2018   LDLCALC 63 02/03/2020   CREATININE 2.44 (H) 06/25/2019      Lab Results  Component Value Date   FRUCTOSAMINE 368 (H) 02/03/2020   FRUCTOSAMINE 293 (H) 09/28/2019   FRUCTOSAMINE 218 03/11/2018   FRUCTOSAMINE 229 09/13/2017    Other active problems: See review of systems      Allergies as of 02/05/2020      Reactions   Morphine And Related Other (See Comments)   Hallucinations    Penicillins Swelling, Rash   Throat swelling Did it involve swelling of the face/tongue/throat, SOB, or low BP? Yes Did it involve sudden or severe rash/hives, skin peeling, or any reaction on the inside of your mouth or nose? Yes Did  you need to seek medical attention at a hospital or doctor's office? Yes When did it last happen?young child If all above answers are "NO", may proceed with cephalosporin use.      Medication List       Accurate as of February 05, 2020 10:16 AM. If you have any questions, ask your nurse or doctor.        acetaminophen 325 MG tablet Commonly known as: TYLENOL Take 2 tablets (650 mg total) by mouth every 6 (six) hours as needed for mild pain (or Fever >/= 101).   amLODipine 10 MG tablet Commonly known as: NORVASC SMARTSIG:.5 Tablet(s) By Mouth Daily   aspirin 325 MG EC tablet Take 325 mg by mouth daily.   atorvastatin 20 MG tablet Commonly known as: LIPITOR Take 1 tablet by  mouth once daily   bisacodyl 5 MG EC tablet Commonly known as: DULCOLAX Take 5 mg by mouth daily as needed for moderate constipation.   calcitRIOL 0.5 MCG capsule Commonly known as: ROCALTROL Take by mouth.   cilostazol 100 MG tablet Commonly known as: PLETAL Take 1 tablet by mouth twice daily   docusate sodium 100 MG capsule Commonly known as: COLACE Take 200 mg by mouth daily.   ferrous sulfate 325 (65 FE) MG tablet Take 650 mg by mouth daily.   FREESTYLE LITE test strip Generic drug: glucose blood USE AS INSTRUCTED TO CHECK BLOOD SUGAR ONCE DAILY.   gabapentin 100 MG capsule Commonly known as: NEURONTIN TAKE 1 CAPSULE BY MOUTH TWICE DAILY AS NEEDED   insulin glargine 100 UNIT/ML injection Commonly known as: LANTUS Inject 14 Units into the skin daily. Inject 14 units under the skin once daily.   insulin regular 100 units/mL injection Commonly known as: NOVOLIN R Inject into the skin 2 (two) times daily before a meal. Inject 10 units under the skin before breakfast and 5 units before dinner.   lidocaine 2 % injection Commonly known as: XYLOCAINE Inject into the skin.   lidocaine-prilocaine cream Commonly known as: EMLA SMARTSIG:Sparingly Topical   MIRCERA IJ Mircera   omeprazole 20 MG capsule Commonly known as: PRILOSEC TAKE 1 CAPSULE BY MOUTH ONCE DAILY ( DUE FOR OFFICE VISIT THIS MONTH)   oxyCODONE 5 MG immediate release tablet Commonly known as: Roxicodone Take 1 tablet (5 mg total) by mouth every 4 (four) hours as needed.   Vitamin D (Ergocalciferol) 1.25 MG (50000 UNIT) Caps capsule Commonly known as: DRISDOL Take 1 capsule by mouth once a week       Allergies:  Allergies  Allergen Reactions  . Morphine And Related Other (See Comments)    Hallucinations   . Penicillins Swelling and Rash    Throat swelling Did it involve swelling of the face/tongue/throat, SOB, or low BP? Yes Did it involve sudden or severe rash/hives, skin peeling, or any  reaction on the inside of your mouth or nose? Yes Did you need to seek medical attention at a hospital or doctor's office? Yes When did it last happen?young child If all above answers are "NO", may proceed with cephalosporin use.    Past Medical History:  Diagnosis Date  . Asthma    No probnlems recently  . 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 CHF (congestive heart failure) ([place])   . Chronic kidney disease    Tu/Th/Sa  . Diabetes mellitus    Type II per Dr [name]  (patient said type  I)  . Fibroid   . GERD (gastroesophageal reflux disease)   . Glaucoma   . Hyperlipidemia   . Hypertension   . Iron deficiency anemia 03/09/2016  . Peripheral vascular disease ([place])   . Pneumonia 2006  . PONV (postoperative nausea and vomiting)   . Shortness of breath dyspnea    with exdrtion, "Walkling too fast"  . Stroke [facility])    no residual    Past Surgical History:  Procedure Laterality Date  . ABDOMINAL HYSTERECTOMY    . AV FISTULA PLACEMENT Left 04/27/2019   Procedure: INSERTION OF ARTERIOVENOUS (AV) GORE-TEX GRAFT LEFT UPPER ARM;  Surgeon: Angelia Mould, MD;  Location: [place];  Service: Vascular;  Laterality: Left;  . BIOPSY  06/10/2017   Procedure: BIOPSY;  Surgeon: Ronnette Juniper, MD;  Location: [place];  Service: Gastroenterology;;  . BREAST BIOPSY Right   . CERVICAL FUSION     with graft from hip  . COLONOSCOPY  July 09, 2012  . DIRECT LARYNGOSCOPY N/A 06/07/2014   Procedure: DIRECT LARYNGOSCOPY with BIOPSY and EXCISION VOLLECULAR CYST;  Surgeon: Ruby Cola, MD;  [facility] OR;  Service: ENT;  Laterality: N/A;  . ESOPHAGOGASTRODUODENOSCOPY (EGD) WITH PROPOFOL Left 06/10/2017   Procedure: ESOPHAGOGASTRODUODENOSCOPY (EGD) WITH PROPOFOL;  Surgeon: Ronnette Juniper, MD;  Location: [place];  Service: Gastroenterology;  Laterality: Left;  . FLEXIBLE SIGMOIDOSCOPY Left 06/10/2017   Procedure: FLEXIBLE SIGMOIDOSCOPY;   Surgeon: Ronnette Juniper, MD;  Location: [place];  Service: Gastroenterology;  Laterality: Left;  . LOOP RECORDER INSERTION N/A 06/20/2016   Procedure: Loop Recorder Insertion;  Surgeon: [name], MD;  Location: [place] CV LAB;  Service: Cardiovascular;  Laterality: N/A;  . REFRACTIVE SURGERY Bilateral    both eyes  . SPINE SURGERY     lumbar    Family History  Problem Relation Age of Onset  . Cancer Mother   . Heart disease Mother   . Diabetes Father   . Cancer Brother   . Cancer Brother   . Throat cancer Brother     Social History:  reports that she has been smoking cigarettes. She has a 30.00 pack-year smoking history. She has never used smokeless tobacco. She reports previous alcohol use. She reports previous drug use. Drug: Methylphenidate.  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     HYPERLIPIDEMIA: The lipid abnormality consists of elevated LDL controlled with Lipitor 20 mg .   Her baseline LDL was 202  She is taking this daily .  Lab Results  Component Value Date   CHOL 145 02/03/2020   HDL 46.20 02/03/2020   LDLCALC 63 02/03/2020   TRIG 177.0 (H) 02/03/2020   CHOLHDL 3 02/03/2020       Last diabetic foot exam was done in 9/21  Has been taking gabapentin 100mg  twice daily for lower leg leg pains long-term with relief  She has had her Covid vaccines   Examination:   BP 118/62   Pulse 88   Ht 5\' 3"  (1.6 m)   Wt 137 lb 3.2 oz (62.2 kg)   SpO2 96%   BMI 24.30 kg/m   Body mass index is 24.3 kg/m.    ASSESSMENT/ PLAN:  Diabetes type 2 on insulin See history of present illness for detailed discussion of current diabetes management, blood sugar patterns and problems  identified  Her A1c is higher than usual at 7.7  Fructosamine is also higher indicating recent high sugars also  Her daughter is not checking her blood sugars monitoring likely may have high readings both fasting and after meals Needs more evaluation of her blood sugar patterns to help adjust her insulin doses Diet has not changed  Recommendations:  She will be given a new FreeStyle meter in case her home meter is reading falsely low  She will make sure she is not using expired test strips  Her daughter was reminded to make sure she checks her sugars regularly a few times a week alternating fasting and after meals including after dinner  Make sure she has some protein with breakfast daily  Professional Freestyle libre sensor will be applied when available to evaluate her blood sugar patterns  Reviewed freestyle libre results in 2 weeks  In the meantime increase Lantus to 16 units  HYPERLIPIDEMIA: Has been on 20 mg Lipitor with good control.  We will continue same dose    There are no Patient Instructions on file for this visit.  Elayne Snare 02/05/2020, 10:16 AM     Note: This office note was prepared with Dragon voice recognition system technology. Any transcriptional errors that result from this process are unintentional.

## 2020-02-05 NOTE — Patient Instructions (Addendum)
1. Schedule 1-hour EEG. If normal, we will do a 24-hour EEG  2. We will contact Dr. Kennon Holter office and see about the loop recorder and if we can do a brain MRI  3. Follow-up after tests, call for any changes

## 2020-02-05 NOTE — Patient Instructions (Signed)
lantus 16 units daily  Check blood sugars on waking up 2-3 days a week  Also check blood sugars about 2 hours after meals and do this after different meals by rotation  Recommended blood sugar levels on waking up are 90-130 and about 2 hours after meal is 130-160  Please bring your blood sugar monitor to each visit, thank you

## 2020-02-05 NOTE — Progress Notes (Signed)
Freestyle Libre Pro Placement   Julie Brewer was here today with her daughter for placement of the [employer] Pro     -Inserted sensor (   R -  WNL)  Lot [id number] A SN [id number] Expiration 05/28/20 Reader used to start sensor:  [id number]  Instructed patient's daughter to fill out the daily log (provided) and bring this and the sensor to her next appointment with Dr.Kumar in 2 weeks.   Discussed that she can shower as usual and be careful not to accidentally remove sensor.  If this happens she should call, save senor in a bag and bring with him to his appointment along with the daily log. Discussed to avoid using over 1000 mg tylenol daily or vitamin C supplements more than 500 mg as these can cause discrepancies in the values.  Darrol Jump, RD 02/05/2020 4:11 PM. '

## 2020-02-05 NOTE — Progress Notes (Signed)
NEUROLOGY CONSULTATION NOTE  [name] MRN: [id number] DOB: [birth year]  Referring provider: Dr. [name] Primary care provider: Dr. [name]  Reason for consult:  Seizure-like activity  Dear Dr Justin Mend:  Thank you for your kind referral of Julie Brewer for consultation of the above symptoms. Although her history is well known to you, please allow me to reiterate it for the purpose of our medical record. The patient was accompanied to the clinic by her daughter Verdon Brewer who also provides collateral information. Records and images were personally reviewed where available.   HISTORY OF PRESENT ILLNESS: This is a pleasant 70 year old right-handed woman with a history of hypertension, hyperlipidemia, diabetes, CKD on dialysis, legally blind, stroke in 2010 with residual mild left-sided weakness, presenting for evaluation of recurrent episodes of loss of consciousness. The episodes started around 2-3 years ago. There is no prior warning, she notes that she is eating when they happen most of the time. She has been living with her daughter Verdon Brewer since March 2021, and they report episodes around once a month, however she had 2 in December, last episode was 01/23/20. [name] reports they occur either when eating breakfast or dinner, she would make a noise like a growl, then starts falling over to the right side. She is staring with eyes open, not responding, and would be out for a minute. No jerking/convulsive activity seen. She would be a little disoriented and clammy after. She feels tired and would feel the urge to have a bowel movement. She has had urinary incontinence with some of the episodes. They report a bigger episode in January 2021 when she was more confused and did not recognize her family after, she was brought to [facility], there was note of episodes of hypoglycemia in the past, she had a loop recorder due to these episodes which was normal on interrogation. She denies any  olfactory/gustatory hallucinations, deja vu, rising epigastric sensation, focal numbness/tingling/weakness, myoclonic jerks. She denies any significant headaches, dizziness, dysarthria/dysphagia. She lost her vision in 2011. She has dialysis T-Th-F. Her mother used to have seizures and was found to have a brain tumor. Otherwise she had a normal birth and early development.  There is no history of febrile convulsions, CNS infections such as meningitis/encephalitis, significant traumatic brain injury, neurosurgical procedures.   PAST MEDICAL HISTORY: Past Medical History:  Diagnosis Date  . Asthma    No probnlems recently  . 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 CHF (congestive heart failure) ([place])   . Chronic kidney disease    Tu/Th/Sa  . Diabetes mellitus    Type II per Dr [name]  (patient said type I)  . Fibroid   . GERD (gastroesophageal reflux disease)   . Glaucoma   . Hyperlipidemia   . Hypertension   . Iron deficiency anemia 03/09/2016  . Peripheral vascular disease ([place])   . Pneumonia 2006  . PONV (postoperative nausea and vomiting)   . Shortness of breath dyspnea    with exdrtion, "Walkling too fast"  . Stroke [facility])    no residual    PAST SURGICAL HISTORY: Past Surgical History:  Procedure Laterality Date  . ABDOMINAL HYSTERECTOMY    . AV FISTULA PLACEMENT Left 04/27/2019   Procedure: INSERTION OF ARTERIOVENOUS (AV) GORE-TEX GRAFT LEFT UPPER ARM;  Surgeon: Angelia Mould, MD;  Location: [place];  Service: Vascular;  Laterality: Left;  . BIOPSY  06/10/2017  Procedure: BIOPSY;  Surgeon: Ronnette Juniper, MD;  Location: [place];  Service: Gastroenterology;;  . BREAST BIOPSY Right   . CERVICAL FUSION     with graft from hip  . COLONOSCOPY  July 09, 2012  . DIRECT LARYNGOSCOPY N/A 06/07/2014   Procedure: DIRECT LARYNGOSCOPY with BIOPSY and EXCISION VOLLECULAR CYST;  Surgeon: Ruby Cola, MD;  Location: [facility] OR;  Service:  ENT;  Laterality: N/A;  . ESOPHAGOGASTRODUODENOSCOPY (EGD) WITH PROPOFOL Left 06/10/2017   Procedure: ESOPHAGOGASTRODUODENOSCOPY (EGD) WITH PROPOFOL;  Surgeon: Ronnette Juniper, MD;  Location: [place];  Service: Gastroenterology;  Laterality: Left;  . FLEXIBLE SIGMOIDOSCOPY Left 06/10/2017   Procedure: FLEXIBLE SIGMOIDOSCOPY;  Surgeon: Ronnette Juniper, MD;  Location: [place];  Service: Gastroenterology;  Laterality: Left;  . LOOP RECORDER INSERTION N/A 06/20/2016   Procedure: Loop Recorder Insertion;  Surgeon: [name], MD;  Location: [place] CV LAB;  Service: Cardiovascular;  Laterality: N/A;  . REFRACTIVE SURGERY Bilateral    both eyes  . SPINE SURGERY     lumbar    MEDICATIONS: Current Outpatient Medications on File Prior to Visit  Medication Sig Dispense Refill  . acetaminophen (TYLENOL) 325 MG tablet Take 2 tablets (650 mg total) by mouth every 6 (six) hours as needed for mild pain (or Fever >/= 101).    Marland Kitchen amLODipine (NORVASC) 10 MG tablet SMARTSIG:.5 Tablet(s) By Mouth Daily    . aspirin 325 MG EC tablet Take 325 mg by mouth daily.     Marland Kitchen atorvastatin (LIPITOR) 20 MG tablet Take 1 tablet by mouth once daily 90 tablet 0  . bisacodyl (DULCOLAX) 5 MG EC tablet Take 5 mg by mouth daily as needed for moderate constipation.     . calcitRIOL (ROCALTROL) 0.5 MCG capsule Take by mouth.    . cilostazol (PLETAL) 100 MG tablet Take 1 tablet by mouth twice daily 180 tablet 0  . docusate sodium (COLACE) 100 MG capsule Take 200 mg by mouth daily.     . ferrous sulfate 325 (65 FE) MG tablet Take 650 mg by mouth daily.    Marland Kitchen gabapentin (NEURONTIN) 100 MG capsule TAKE 1 CAPSULE BY MOUTH TWICE DAILY AS NEEDED 180 capsule 1  . glucose blood (FREESTYLE LITE) test strip USE AS INSTRUCTED TO CHECK BLOOD SUGAR ONCE DAILY. 50 each 3  . insulin glargine (LANTUS) 100 UNIT/ML injection Inject 14 Units into the skin daily. Inject 14 units under the skin once daily.    . insulin regular (NOVOLIN R) 100  units/mL injection Inject into the skin 2 (two) times daily before a meal. Inject 10 units under the skin before breakfast and 5 units before dinner.    . lidocaine (XYLOCAINE) 2 % injection Inject into the skin.     Marland Kitchen lidocaine-prilocaine (EMLA) cream SMARTSIG:Sparingly Topical    . Methoxy PEG-Epoetin Beta (MIRCERA IJ) Mircera    . omeprazole (PRILOSEC) 20 MG capsule TAKE 1 CAPSULE BY MOUTH ONCE DAILY ( DUE FOR OFFICE VISIT THIS MONTH) 90 capsule 0  . Vitamin D, Ergocalciferol, (DRISDOL) 1.25 MG (50000 UNIT) CAPS capsule Take 1 capsule by mouth once a week 12 capsule 0  . oxyCODONE (ROXICODONE) 5 MG immediate release tablet Take 1 tablet (5 mg total) by mouth every 4 (four) hours as needed. (Patient not taking: Reported on 02/05/2020) 15 tablet 0   No current facility-administered medications on file prior to visit.    ALLERGIES: Allergies  Allergen Reactions  . Morphine And Related Other (See Comments)    Hallucinations   .  Penicillins Swelling and Rash    Throat swelling Did it involve swelling of the face/tongue/throat, SOB, or low BP? Yes Did it involve sudden or severe rash/hives, skin peeling, or any reaction on the inside of your mouth or nose? Yes Did you need to seek medical attention at a hospital or doctor's office? Yes When did it last happen?young child If all above answers are "NO", may proceed with cephalosporin use.    FAMILY HISTORY: Family History  Problem Relation Age of Onset  . Cancer Mother   . Heart disease Mother   . Diabetes Father   . Cancer Brother   . Cancer Brother   . Throat cancer Brother     SOCIAL HISTORY: Social History   Socioeconomic History  . Marital status: Legally Separated    Spouse name: Not on file  . Number of children: Not on file  . Years of education: Not on file  . Highest education level: Not on file  Occupational History  . Not on file  Tobacco Use  . Smoking status: Current Every Day Smoker    Packs/day: 1.00     Years: 30.00    Pack years: 30.00    Types: Cigarettes  . Smokeless tobacco: Never Used  Vaping Use  . Vaping Use: Never used  Substance and Sexual Activity  . Alcohol use: Not Currently    Comment: socially  . Drug use: Not Currently    Types: Methylphenidate  . Sexual activity: Never    Birth control/protection: Post-menopausal, Surgical    Comment: Hysterectomy  Other Topics Concern  . Not on file  Social History Narrative   Right handed   Lives with daughter in a one story home   Drinks caffeine   Social Determinants of Health   Financial Resource Strain: Not on file  Food Insecurity: Not on file  Transportation Needs: Not on file  Physical Activity: Not on file  Stress: Not on file  Social Connections: Not on file  Intimate Partner Violence: Not on file     PHYSICAL EXAM: Vitals:   02/05/20 0847  BP: 121/72  Pulse: (!) 101  Resp: 18  SpO2: 98%   General: No acute distress Head:  Normocephalic/atraumatic Skin/Extremities: No rash, no edema Neurological Exam: Mental status: alert and awake, no dysarthria or aphasia, Fund of knowledge is appropriate.  Recent and remote memory are intact.  Attention and concentration are normal.     Cranial nerves: CN I: not tested CN II: pupils equal, round and reactive to light. Legally blind CN III, IV, VI:  full range of motion, no nystagmus, no ptosis CN V: facial sensation intact CN VII: upper and lower face symmetric CN VIII: hearing intact to conversation Bulk & Tone: normal, no fasciculations. Motor: 5/5 throughout excpet for 4/5 left hip flexion, no pronator drift. Sensation: intact to light touch, cold, pin on both UE, decreased pin on left LE, decreased vibration to right knee.  Deep Tendon Reflexes: +2 on both UE, unable to elicit on both LE Cerebellar: no incoordination on finger to nose testing Gait: slow and cautious, slightly wide-based, no ataxia Tremor: none   IMPRESSION: This is a pleasant 70 year  old right-handed woman with a history of hypertension, hyperlipidemia, diabetes, CKD on dialysis, legally blind, stroke in 2010 with residual mild left-sided weakness, presenting for evaluation of recurrent episodes of loss of consciousness. She has had an unremarkable cardiac evaluation and has had a loop recorder since 2018, episodes have not shown any  significant arrhythmia. On prior hospitalization, hypoglycemia was also considered as potential cause. She is being referred for evaluation of possible seizures, we discussed doing an MRI brain with and without contrast and 1-hour EEG to assess for focal abnormalities that increase risk for recurrent seizures. If normal, a 24-hour EEG will be ordered. She does not drive. Continue close supervision. Follow-up after tests, they know to call for any changes.   Thank you for allowing me to participate in the care of this patient. Please do not hesitate to call for any questions or concerns.   Ellouise Newer, M.D.  CC: Dr. Justin Mend

## 2020-02-06 DIAGNOSIS — N2581 Secondary hyperparathyroidism of renal origin: Secondary | ICD-10-CM | POA: Diagnosis not present

## 2020-02-06 DIAGNOSIS — E1129 Type 2 diabetes mellitus with other diabetic kidney complication: Secondary | ICD-10-CM | POA: Diagnosis not present

## 2020-02-06 DIAGNOSIS — Z992 Dependence on renal dialysis: Secondary | ICD-10-CM | POA: Diagnosis not present

## 2020-02-06 DIAGNOSIS — D509 Iron deficiency anemia, unspecified: Secondary | ICD-10-CM | POA: Diagnosis not present

## 2020-02-06 DIAGNOSIS — N186 End stage renal disease: Secondary | ICD-10-CM | POA: Diagnosis not present

## 2020-02-08 ENCOUNTER — Ambulatory Visit: Payer: Medicare Other | Admitting: Neurology

## 2020-02-09 ENCOUNTER — Other Ambulatory Visit: Payer: Self-pay | Admitting: Neurology

## 2020-02-09 DIAGNOSIS — Z992 Dependence on renal dialysis: Secondary | ICD-10-CM | POA: Diagnosis not present

## 2020-02-09 DIAGNOSIS — E1129 Type 2 diabetes mellitus with other diabetic kidney complication: Secondary | ICD-10-CM | POA: Diagnosis not present

## 2020-02-09 DIAGNOSIS — N2581 Secondary hyperparathyroidism of renal origin: Secondary | ICD-10-CM | POA: Diagnosis not present

## 2020-02-09 DIAGNOSIS — N186 End stage renal disease: Secondary | ICD-10-CM | POA: Diagnosis not present

## 2020-02-09 DIAGNOSIS — D509 Iron deficiency anemia, unspecified: Secondary | ICD-10-CM | POA: Diagnosis not present

## 2020-02-09 DIAGNOSIS — R55 Syncope and collapse: Secondary | ICD-10-CM

## 2020-02-11 DIAGNOSIS — N2581 Secondary hyperparathyroidism of renal origin: Secondary | ICD-10-CM | POA: Diagnosis not present

## 2020-02-11 DIAGNOSIS — D509 Iron deficiency anemia, unspecified: Secondary | ICD-10-CM | POA: Diagnosis not present

## 2020-02-11 DIAGNOSIS — Z992 Dependence on renal dialysis: Secondary | ICD-10-CM | POA: Diagnosis not present

## 2020-02-11 DIAGNOSIS — E1129 Type 2 diabetes mellitus with other diabetic kidney complication: Secondary | ICD-10-CM | POA: Diagnosis not present

## 2020-02-11 DIAGNOSIS — N186 End stage renal disease: Secondary | ICD-10-CM | POA: Diagnosis not present

## 2020-02-13 DIAGNOSIS — N2581 Secondary hyperparathyroidism of renal origin: Secondary | ICD-10-CM | POA: Diagnosis not present

## 2020-02-13 DIAGNOSIS — N186 End stage renal disease: Secondary | ICD-10-CM | POA: Diagnosis not present

## 2020-02-13 DIAGNOSIS — Z992 Dependence on renal dialysis: Secondary | ICD-10-CM | POA: Diagnosis not present

## 2020-02-13 DIAGNOSIS — D509 Iron deficiency anemia, unspecified: Secondary | ICD-10-CM | POA: Diagnosis not present

## 2020-02-13 DIAGNOSIS — E1129 Type 2 diabetes mellitus with other diabetic kidney complication: Secondary | ICD-10-CM | POA: Diagnosis not present

## 2020-02-15 ENCOUNTER — Ambulatory Visit: Payer: Medicare Other | Admitting: Endocrinology

## 2020-02-15 ENCOUNTER — Other Ambulatory Visit: Payer: Medicare Other

## 2020-02-16 DIAGNOSIS — D509 Iron deficiency anemia, unspecified: Secondary | ICD-10-CM | POA: Diagnosis not present

## 2020-02-16 DIAGNOSIS — N2581 Secondary hyperparathyroidism of renal origin: Secondary | ICD-10-CM | POA: Diagnosis not present

## 2020-02-16 DIAGNOSIS — Z992 Dependence on renal dialysis: Secondary | ICD-10-CM | POA: Diagnosis not present

## 2020-02-16 DIAGNOSIS — E1129 Type 2 diabetes mellitus with other diabetic kidney complication: Secondary | ICD-10-CM | POA: Diagnosis not present

## 2020-02-16 DIAGNOSIS — N186 End stage renal disease: Secondary | ICD-10-CM | POA: Diagnosis not present

## 2020-02-17 ENCOUNTER — Ambulatory Visit (INDEPENDENT_AMBULATORY_CARE_PROVIDER_SITE_OTHER): Payer: Medicare Other | Admitting: Endocrinology

## 2020-02-17 ENCOUNTER — Encounter: Payer: Self-pay | Admitting: Endocrinology

## 2020-02-17 ENCOUNTER — Other Ambulatory Visit: Payer: Self-pay

## 2020-02-17 ENCOUNTER — Ambulatory Visit (INDEPENDENT_AMBULATORY_CARE_PROVIDER_SITE_OTHER): Payer: Medicare Other | Admitting: Neurology

## 2020-02-17 VITALS — BP 138/74 | HR 95 | Ht 63.0 in | Wt 137.6 lb

## 2020-02-17 DIAGNOSIS — E1165 Type 2 diabetes mellitus with hyperglycemia: Secondary | ICD-10-CM | POA: Diagnosis not present

## 2020-02-17 DIAGNOSIS — R55 Syncope and collapse: Secondary | ICD-10-CM | POA: Diagnosis not present

## 2020-02-17 DIAGNOSIS — Z794 Long term (current) use of insulin: Secondary | ICD-10-CM

## 2020-02-17 MED ORDER — INSULIN PEN NEEDLE 31G X 5 MM MISC: 1 refills | Status: AC

## 2020-02-17 MED ORDER — TOUJEO SOLOSTAR 300 UNIT/ML ~~LOC~~ SOPN: 15.0000 [IU] | PEN_INJECTOR | Freq: Every day | SUBCUTANEOUS | 3 refills | Status: DC

## 2020-02-17 NOTE — Patient Instructions (Addendum)
12 [place] in am daily  Am sugar between 100-150, if not adjust by 2 units  Take 10 R insulin before supper and drink shake right after meal

## 2020-02-17 NOTE — Progress Notes (Signed)
Patient ID: Julie Brewer, female   DOB: 1950/12/28, 70 y.o.   MRN: [id number]   Reason for Appointment: Follow-up of various problems  History of Present Illness    Diagnosis: Type 2 DIABETES MELITUS, date of diagnosis:  1985  Prior history: She has been on insulin since diagnosis and on Lantus previously Also at some point had been started on Glucophage several years ago also Because of insurance preference Lantus was changed to Levemir  She refuses to use analog rapid acting insulin because of cost and is using regular insulin for several years   Her blood sugars are generally well controlled and A1c usually under 7% Her A1c previously was higher with stopping metformin at 8%  Recent history:   Insulin regimen: Lantus insulin 16 U in the morning daily.  Regular insulin 20-30 minutes Before eating, 10 units a.m. and 5-7 ac supper  Oral hypoglycemic drugs: None   Current blood sugar patterns, management and problems:  Her A1c is last 7.7 compared to 6.5 Fructosamine is higher at 368 compared to 293   She is here for review of her freestyle libre professional sensor for the last 2 weeks  She has had tendency to hypoglycemia overnight mostly even though on her last visit her fasting blood sugars were high late morning  This may be related to dawn phenomenon plus inconsistent action of Lantus insulin over 24 hours  She also appears to have significant hyperglycemia with her protein shake that she has mid afternoon daily, not clear what the contents are of this  Also sometimes will have high readings after dinner and variably high readings after breakfast  Most of her high readings after breakfast from rebounding low sugars  Highest blood sugars in the evenings were with eating out [place] food  She has not received her new meter and has not checked her sugar at home herself  Her daughter is drawing up her insulin and the syringe  No hypoglycemic  symptoms, 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   Side effects from medications: None      Glucometer:  FreeStyle   Professional CONTINUOUS GLUCOSE MONITORING RECORD INTERPRETATION    Dates of Recording: 1/7 through 02/17/2020  Sensor description: [employer] professional  Results statistics:   CGM use % of time  97  Average and GV  177+/-43  Time in range      46%  % Time Above 180  31  % Time above 250  16  % Time Below target 7    PRE-MEAL Fasting Lunch Dinner Bedtime Overall  Glucose range:       Mean/median:  120   183  210  177   POST-MEAL PC Breakfast PC Lunch PC Dinner  Glucose range:     Mean/median:  215   254    Glycemic patterns summary: Blood sugars are showing a sinusoid pattern with high readings averaging 180 at midnight, declining to normal or low levels by morning, rising again midday and again increasing after 3 PM HIGHEST blood sugars on an average between 7-10 PM  Hyperglycemic episodes are occurring frequently late morning, early afternoon and variably late evening  Hypoglycemic episodes occurred overnight, early morning once early afternoon and another episode after 11 PM  Overnight periods: Blood sugars are quite variable with on an average high readings late at night They are then declining significantly with low readings occurring early morning almost daily  Preprandial periods: Blood sugars are mildly increased at breakfast time  which is 9:45 AM and also moderately increased around 180 average before dinner  Postprandial periods:   After breakfast: Blood sugars are rising variably after breakfast averaging 215 after eating After lunch:   This he does not have lunch After dinner: Average blood sugar after dinner is 254 which is between 8-10 PM but is quite variable   Meals:  usually 2 meals per day at 10 AM and 5 PM.     Mealtime protein sources:turkey, chicken.  Eating cereal for breakfast Or oatmeal Avoiding sweet  drinks  Physical activity: exercise: Some walking within the house              Wt Readings from Last 3 Encounters:  02/17/20 137 lb 9.6 oz (62.4 kg)  02/05/20 137 lb 3.2 oz (62.2 kg)  02/05/20 138 lb (62.6 kg)   Lab Results  Component Value Date   HGBA1C 7.7 (H) 02/03/2020   HGBA1C 6.5 09/28/2019   HGBA1C 6.1 (A) 06/24/2019   Lab Results  Component Value Date   MICROALBUR 196.7 (H) 03/11/2018   LDLCALC 63 02/03/2020   CREATININE 2.44 (H) 06/25/2019      Lab Results  Component Value Date   FRUCTOSAMINE 368 (H) 02/03/2020   FRUCTOSAMINE 293 (H) 09/28/2019   FRUCTOSAMINE 218 03/11/2018   FRUCTOSAMINE 229 09/13/2017    Other active problems: See review of systems      Allergies as of 02/17/2020      Reactions   Morphine And Related Other (See Comments)   Hallucinations    Penicillins Swelling, Rash   Throat swelling Did it involve swelling of the face/tongue/throat, SOB, or low BP? Yes Did it involve sudden or severe rash/hives, skin peeling, or any reaction on the inside of your mouth or nose? Yes Did you need to seek medical attention at a hospital or doctor's office? Yes When did it last happen?young child If all above answers are "NO", may proceed with cephalosporin use.      Medication List       Accurate as of February 17, 2020 10:32 AM. If you have any questions, ask your nurse or doctor.        acetaminophen 325 MG tablet Commonly known as: TYLENOL Take 2 tablets (650 mg total) by mouth every 6 (six) hours as needed for mild pain (or Fever >/= 101).   amLODipine 10 MG tablet Commonly known as: NORVASC SMARTSIG:.5 Tablet(s) By Mouth Daily   aspirin 325 MG EC tablet Take 325 mg by mouth daily.   atorvastatin 20 MG tablet Commonly known as: LIPITOR Take 1 tablet by mouth once daily   bisacodyl 5 MG EC tablet Commonly known as: DULCOLAX Take 5 mg by mouth daily as needed for moderate constipation.   calcitRIOL 0.5 MCG capsule Commonly  known as: ROCALTROL Take by mouth.   cilostazol 100 MG tablet Commonly known as: PLETAL Take 1 tablet by mouth twice daily   docusate sodium 100 MG capsule Commonly known as: COLACE Take 200 mg by mouth daily.   ferrous sulfate 325 (65 FE) MG tablet Take 650 mg by mouth daily.   FreeStyle Freedom Kit Use to check blood sugar once a day dx code   FREESTYLE LITE test strip Generic drug: glucose blood USE AS INSTRUCTED TO CHECK BLOOD SUGAR ONCE DAILY.   gabapentin 100 MG capsule Commonly known as: NEURONTIN TAKE 1 CAPSULE BY MOUTH TWICE DAILY AS NEEDED   insulin glargine 100 UNIT/ML injection Commonly known as: LANTUS Inject 16 Units  into the skin daily. Inject 16 units under the skin once daily.   insulin regular 100 units/mL injection Commonly known as: NOVOLIN R Inject into the skin 2 (two) times daily before a meal. Inject 10 units under the skin before breakfast and 5-7 units before dinner.   lidocaine 2 % injection Commonly known as: XYLOCAINE Inject into the skin.   lidocaine-prilocaine cream Commonly known as: EMLA SMARTSIG:Sparingly Topical   MIRCERA IJ   omeprazole 20 MG capsule Commonly known as: PRILOSEC TAKE 1 CAPSULE BY MOUTH ONCE DAILY ( DUE FOR OFFICE VISIT THIS MONTH)   oxyCODONE 5 MG immediate release tablet Commonly known as: Roxicodone Take 1 tablet (5 mg total) by mouth every 4 (four) hours as needed.   Vitamin D (Ergocalciferol) 1.25 MG (50000 UNIT) Caps capsule Commonly known as: DRISDOL Take 1 capsule by mouth once a week       Allergies:  Allergies  Allergen Reactions  . Morphine And Related Other (See Comments)    Hallucinations   . Penicillins Swelling and Rash    Throat swelling Did it involve swelling of the face/tongue/throat, SOB, or low BP? Yes Did it involve sudden or severe rash/hives, skin peeling, or any reaction on the inside of your mouth or nose? Yes Did you need to seek medical attention at a hospital or doctor's  office? Yes When did it last happen?young child If all above answers are "NO", may proceed with cephalosporin use.    Past Medical History:  Diagnosis Date  . Asthma    No probnlems recently  . 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 CHF (congestive heart failure) ([place])   . Chronic kidney disease    Tu/Th/Sa  . Diabetes mellitus    Type II per Dr [name]  (patient said type I)  . Fibroid   . GERD (gastroesophageal reflux disease)   . Glaucoma   . Hyperlipidemia   . Hypertension   . Iron deficiency anemia 03/09/2016  . Peripheral vascular disease ([place])   . Pneumonia 2006  . PONV (postoperative nausea and vomiting)   . Shortness of breath dyspnea    with exdrtion, "Walkling too fast"  . Stroke [facility])    no residual    Past Surgical History:  Procedure Laterality Date  . ABDOMINAL HYSTERECTOMY    . AV FISTULA PLACEMENT Left 04/27/2019   Procedure: INSERTION OF ARTERIOVENOUS (AV) GORE-TEX GRAFT LEFT UPPER ARM;  Surgeon: Angelia Mould, MD;  Location: [place] [facility];  Service: Vascular;  Laterality: Left;  . BIOPSY  06/10/2017   Procedure: BIOPSY;  Surgeon: Ronnette Juniper, MD;  Location: [place];  Service: Gastroenterology;;  . BREAST BIOPSY Right   . CERVICAL FUSION     with graft from hip  . COLONOSCOPY  July 09, 2012  . DIRECT LARYNGOSCOPY N/A 06/07/2014   Procedure: DIRECT LARYNGOSCOPY with BIOPSY and EXCISION VOLLECULAR CYST;  Surgeon: Ruby Cola, MD;  Location: [facility] OR;  Service: ENT;  Laterality: N/A;  . ESOPHAGOGASTRODUODENOSCOPY (EGD) WITH PROPOFOL Left 06/10/2017   Procedure: ESOPHAGOGASTRODUODENOSCOPY (EGD) WITH PROPOFOL;  Surgeon: Ronnette Juniper, MD;  Location: [place];  Service: Gastroenterology;  Laterality: Left;  . FLEXIBLE SIGMOIDOSCOPY Left 06/10/2017   Procedure: FLEXIBLE SIGMOIDOSCOPY;  Surgeon: Ronnette Juniper, MD;  Location: [place];  Service: Gastroenterology;  Laterality: Left;  . LOOP RECORDER  INSERTION N/A 06/20/2016   Procedure: Loop Recorder Insertion;  Surgeon: [name], MD;  Location: [place]  CV LAB;  Service: Cardiovascular;  Laterality: N/A;  . REFRACTIVE SURGERY Bilateral    both eyes  . SPINE SURGERY     lumbar    Family History  Problem Relation Age of Onset  . Cancer Mother   . Heart disease Mother   . Diabetes Father   . Cancer Brother   . Cancer Brother   . Throat cancer Brother     Social History:  reports that she has been smoking cigarettes. She has a 30.00 pack-year smoking history. She has never used smokeless tobacco. She reports previous alcohol use. She reports previous drug use. Drug: Methylphenidate.  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     HYPERLIPIDEMIA: The lipid abnormality consists of elevated LDL controlled with Lipitor 20 mg .   Her baseline LDL was 202  She is taking this daily .  Lab Results  Component Value Date   CHOL 145 02/03/2020   HDL 46.20 02/03/2020   LDLCALC 63 02/03/2020   TRIG 177.0 (H) 02/03/2020   CHOLHDL 3 02/03/2020       Last diabetic foot exam was done in 9/21  Has been taking gabapentin 160m twice daily for lower leg leg pains long-term with relief  She has had her Covid vaccines   Examination:   BP 138/74   Pulse 95   Ht 5' 3"  (1.6 m)   Wt 137 lb 9.6 oz (62.4 kg)   SpO2 97%   BMI 24.37 kg/m   Body mass index is 24.37 kg/m.    ASSESSMENT/ PLAN:     Diabetes type 2 on insulin See history of present illness for detailed discussion of current diabetes management, blood sugar patterns and problems identified  Her A1c is last higher than usual at 7.7 along with  fructosamine  She is on basal bolus insulin  As discussed above with the freestyle libre analysis she has excessive postprandial hyperglycemia and overnight hypoglycemia Likely can do better with analog basal insulin instead of regular insulin but not clear if she can afford this Also may do better with [place] currently having difficulties affording co-pay for high cost items Still using syringes which her daughter is drawing up but is capable of using the pen  Recommendations:  She will start using the freestyle libre sensor if available through her insurance and affordable cost; paperwork was filled out and faxed to [facility]  Discussed needing to check blood sugars 4 times a day with this and her daughter will help doing this  She will also periodically check blood sugars with her FreeStyle meter which she is now able to use  Switch Lantus to TOUJEO  Basal insulin will be titrated based on fasting blood sugars and her daughter will help do that, currently needs reduced dose to avoid overnight hypoglycemia  She will start with 12 units TOUJEO and go up or down 2 units every 3 to 4 days until morning sugars are consistently between 100 and 150  She will stop drinking the protein shake in the afternoon and only have it right after dinner  Take at least 10 units of regular insulin for her evening  meal when she is using the protein shake and may need more  Continue same dose for breakfast  Again need to take regular insulin 30-minute before eating  Consider patient assistance program for getting Toujeo and Apidra  Follow-up in 6 weeks      There are no Patient Instructions on file for this visit.  Elayne Snare 02/17/2020, 10:32 AM     Note: This office note was prepared with Dragon voice recognition system technology. Any transcriptional errors that result from this process are unintentional.

## 2020-02-18 DIAGNOSIS — D509 Iron deficiency anemia, unspecified: Secondary | ICD-10-CM | POA: Diagnosis not present

## 2020-02-18 DIAGNOSIS — N186 End stage renal disease: Secondary | ICD-10-CM | POA: Diagnosis not present

## 2020-02-18 DIAGNOSIS — E1129 Type 2 diabetes mellitus with other diabetic kidney complication: Secondary | ICD-10-CM | POA: Diagnosis not present

## 2020-02-18 DIAGNOSIS — Z992 Dependence on renal dialysis: Secondary | ICD-10-CM | POA: Diagnosis not present

## 2020-02-18 DIAGNOSIS — N2581 Secondary hyperparathyroidism of renal origin: Secondary | ICD-10-CM | POA: Diagnosis not present

## 2020-02-19 ENCOUNTER — Other Ambulatory Visit: Payer: Self-pay

## 2020-02-19 DIAGNOSIS — R55 Syncope and collapse: Secondary | ICD-10-CM

## 2020-02-20 DIAGNOSIS — N2581 Secondary hyperparathyroidism of renal origin: Secondary | ICD-10-CM | POA: Diagnosis not present

## 2020-02-20 DIAGNOSIS — D509 Iron deficiency anemia, unspecified: Secondary | ICD-10-CM | POA: Diagnosis not present

## 2020-02-20 DIAGNOSIS — Z992 Dependence on renal dialysis: Secondary | ICD-10-CM | POA: Diagnosis not present

## 2020-02-20 DIAGNOSIS — N186 End stage renal disease: Secondary | ICD-10-CM | POA: Diagnosis not present

## 2020-02-20 DIAGNOSIS — E1129 Type 2 diabetes mellitus with other diabetic kidney complication: Secondary | ICD-10-CM | POA: Diagnosis not present

## 2020-02-23 DIAGNOSIS — N2581 Secondary hyperparathyroidism of renal origin: Secondary | ICD-10-CM | POA: Diagnosis not present

## 2020-02-23 DIAGNOSIS — N186 End stage renal disease: Secondary | ICD-10-CM | POA: Diagnosis not present

## 2020-02-23 DIAGNOSIS — D509 Iron deficiency anemia, unspecified: Secondary | ICD-10-CM | POA: Diagnosis not present

## 2020-02-23 DIAGNOSIS — E1129 Type 2 diabetes mellitus with other diabetic kidney complication: Secondary | ICD-10-CM | POA: Diagnosis not present

## 2020-02-23 DIAGNOSIS — Z992 Dependence on renal dialysis: Secondary | ICD-10-CM | POA: Diagnosis not present

## 2020-02-23 NOTE — Procedures (Signed)
ELECTROENCEPHALOGRAM REPORT  Date of Study: 02/17/2020  Patient's Name: Julie Brewer MRN: [id number] Date of Birth: 1950/05/21  Referring Provider: Dr. Ellouise Newer  Clinical History: This is a 70 year old woman with recurrent episodes of loss of consciousness. EEG for classification.  Medications: TYLENOL 325 MG tablet NORVASC 10 MG tablet aspirin 325 MG EC tablet LIPITOR 20 MG tablet DULCOLAX 5 MG EC tablet ROCALTROL 0.5 MCG capsule PLETAL 100 MG tablet COLACE 100 MG capsule 65 FE MG tablet NEURONTIN 100 MG capsule LANTUS 100 UNIT/ML injection NOVOLIN R) 1I00 units/mL injection XYLOCAINE 2 % injection EMLA cream PRILOSEC 20 MG capsule  Technical Summary: A multichannel digital 1-hour EEG recording measured by the international 10-20 system with electrodes applied with paste and impedances below 5000 ohms performed in our laboratory with EKG monitoring in an awake and drowsy patient.  Hyperventilation was not performed. Photic stimulation was performed.  The digital EEG was referentially recorded, reformatted, and digitally filtered in a variety of bipolar and referential montages for optimal display.    Description: The patient is awake and drowsy during the recording.  During maximal wakefulness, there is a symmetric, medium voltage 8 Hz posterior dominant rhythm that attenuates with eye opening.  The record is symmetric.  During drowsiness, there is an increase in theta slowing of the background.  Sleep was not captured. Photic stimulation did not elicit any abnormalities.  There were no epileptiform discharges or electrographic seizures seen.    EKG lead was unremarkable.  Impression: This 1-hour awake and drowsy EEG is normal.    Clinical Correlation: A normal EEG does not exclude a clinical diagnosis of epilepsy.  If further clinical questions remain, prolonged EEG may be helpful.  Clinical correlation is advised.   Ellouise Newer, M.D.

## 2020-02-24 ENCOUNTER — Telehealth: Payer: Self-pay

## 2020-02-24 NOTE — Telephone Encounter (Signed)
-----   Message from Cameron Sprang, MD sent at 02/23/2020  4:22 PM EST ----- Pls let her know the EEG was normal, proceed with prolonged EEG as discussed. Thanks

## 2020-02-24 NOTE — Telephone Encounter (Signed)
Spoke with pt daughter informed her that EEG was normal, proceed with prolonged EEG as discussed

## 2020-02-25 ENCOUNTER — Telehealth: Payer: Self-pay | Admitting: Endocrinology

## 2020-02-25 DIAGNOSIS — N186 End stage renal disease: Secondary | ICD-10-CM | POA: Diagnosis not present

## 2020-02-25 DIAGNOSIS — D509 Iron deficiency anemia, unspecified: Secondary | ICD-10-CM | POA: Diagnosis not present

## 2020-02-25 DIAGNOSIS — Z992 Dependence on renal dialysis: Secondary | ICD-10-CM | POA: Diagnosis not present

## 2020-02-25 DIAGNOSIS — E1129 Type 2 diabetes mellitus with other diabetic kidney complication: Secondary | ICD-10-CM | POA: Diagnosis not present

## 2020-02-25 DIAGNOSIS — N2581 Secondary hyperparathyroidism of renal origin: Secondary | ICD-10-CM | POA: Diagnosis not present

## 2020-02-25 NOTE — Telephone Encounter (Signed)
Insurance called asking if Dr could add some things to pt's Office Visit 1/19 clinical notes.    - add the word "Addendum" -add pt is injecting 3x daily -pt is self adjusting -electric sign & date  Fax# [phone]  Any questions call# [phone]

## 2020-02-26 ENCOUNTER — Other Ambulatory Visit: Payer: Medicare Other

## 2020-02-26 NOTE — Telephone Encounter (Signed)
Please see below.

## 2020-02-27 DIAGNOSIS — N2581 Secondary hyperparathyroidism of renal origin: Secondary | ICD-10-CM | POA: Diagnosis not present

## 2020-02-27 DIAGNOSIS — N186 End stage renal disease: Secondary | ICD-10-CM | POA: Diagnosis not present

## 2020-02-27 DIAGNOSIS — D509 Iron deficiency anemia, unspecified: Secondary | ICD-10-CM | POA: Diagnosis not present

## 2020-02-27 DIAGNOSIS — E1129 Type 2 diabetes mellitus with other diabetic kidney complication: Secondary | ICD-10-CM | POA: Diagnosis not present

## 2020-02-27 DIAGNOSIS — Z992 Dependence on renal dialysis: Secondary | ICD-10-CM | POA: Diagnosis not present

## 2020-02-28 NOTE — Telephone Encounter (Signed)
Done

## 2020-02-29 ENCOUNTER — Other Ambulatory Visit: Payer: Self-pay

## 2020-02-29 ENCOUNTER — Ambulatory Visit (INDEPENDENT_AMBULATORY_CARE_PROVIDER_SITE_OTHER): Payer: Medicare Other | Admitting: Orthotics

## 2020-02-29 DIAGNOSIS — E1151 Type 2 diabetes mellitus with diabetic peripheral angiopathy without gangrene: Secondary | ICD-10-CM

## 2020-02-29 DIAGNOSIS — M2041 Other hammer toe(s) (acquired), right foot: Secondary | ICD-10-CM

## 2020-02-29 DIAGNOSIS — M2042 Other hammer toe(s) (acquired), left foot: Secondary | ICD-10-CM

## 2020-02-29 DIAGNOSIS — L84 Corns and callosities: Secondary | ICD-10-CM

## 2020-02-29 NOTE — Progress Notes (Signed)

## 2020-03-01 DIAGNOSIS — E1129 Type 2 diabetes mellitus with other diabetic kidney complication: Secondary | ICD-10-CM | POA: Diagnosis not present

## 2020-03-01 DIAGNOSIS — N2581 Secondary hyperparathyroidism of renal origin: Secondary | ICD-10-CM | POA: Diagnosis not present

## 2020-03-01 DIAGNOSIS — D509 Iron deficiency anemia, unspecified: Secondary | ICD-10-CM | POA: Diagnosis not present

## 2020-03-01 DIAGNOSIS — N186 End stage renal disease: Secondary | ICD-10-CM | POA: Diagnosis not present

## 2020-03-01 DIAGNOSIS — Z992 Dependence on renal dialysis: Secondary | ICD-10-CM | POA: Diagnosis not present

## 2020-03-01 DIAGNOSIS — E1122 Type 2 diabetes mellitus with diabetic chronic kidney disease: Secondary | ICD-10-CM | POA: Diagnosis not present

## 2020-03-03 DIAGNOSIS — E1129 Type 2 diabetes mellitus with other diabetic kidney complication: Secondary | ICD-10-CM | POA: Diagnosis not present

## 2020-03-03 DIAGNOSIS — N2581 Secondary hyperparathyroidism of renal origin: Secondary | ICD-10-CM | POA: Diagnosis not present

## 2020-03-03 DIAGNOSIS — Z992 Dependence on renal dialysis: Secondary | ICD-10-CM | POA: Diagnosis not present

## 2020-03-03 DIAGNOSIS — D509 Iron deficiency anemia, unspecified: Secondary | ICD-10-CM | POA: Diagnosis not present

## 2020-03-03 DIAGNOSIS — N186 End stage renal disease: Secondary | ICD-10-CM | POA: Diagnosis not present

## 2020-03-05 DIAGNOSIS — N186 End stage renal disease: Secondary | ICD-10-CM | POA: Diagnosis not present

## 2020-03-05 DIAGNOSIS — E1129 Type 2 diabetes mellitus with other diabetic kidney complication: Secondary | ICD-10-CM | POA: Diagnosis not present

## 2020-03-05 DIAGNOSIS — N2581 Secondary hyperparathyroidism of renal origin: Secondary | ICD-10-CM | POA: Diagnosis not present

## 2020-03-05 DIAGNOSIS — Z992 Dependence on renal dialysis: Secondary | ICD-10-CM | POA: Diagnosis not present

## 2020-03-05 DIAGNOSIS — D509 Iron deficiency anemia, unspecified: Secondary | ICD-10-CM | POA: Diagnosis not present

## 2020-03-07 ENCOUNTER — Other Ambulatory Visit: Payer: Self-pay | Admitting: Endocrinology

## 2020-03-08 DIAGNOSIS — Z992 Dependence on renal dialysis: Secondary | ICD-10-CM | POA: Diagnosis not present

## 2020-03-08 DIAGNOSIS — D509 Iron deficiency anemia, unspecified: Secondary | ICD-10-CM | POA: Diagnosis not present

## 2020-03-08 DIAGNOSIS — N186 End stage renal disease: Secondary | ICD-10-CM | POA: Diagnosis not present

## 2020-03-08 DIAGNOSIS — E1129 Type 2 diabetes mellitus with other diabetic kidney complication: Secondary | ICD-10-CM | POA: Diagnosis not present

## 2020-03-08 DIAGNOSIS — N2581 Secondary hyperparathyroidism of renal origin: Secondary | ICD-10-CM | POA: Diagnosis not present

## 2020-03-10 DIAGNOSIS — D509 Iron deficiency anemia, unspecified: Secondary | ICD-10-CM | POA: Diagnosis not present

## 2020-03-10 DIAGNOSIS — E1129 Type 2 diabetes mellitus with other diabetic kidney complication: Secondary | ICD-10-CM | POA: Diagnosis not present

## 2020-03-10 DIAGNOSIS — Z992 Dependence on renal dialysis: Secondary | ICD-10-CM | POA: Diagnosis not present

## 2020-03-10 DIAGNOSIS — N2581 Secondary hyperparathyroidism of renal origin: Secondary | ICD-10-CM | POA: Diagnosis not present

## 2020-03-10 DIAGNOSIS — N186 End stage renal disease: Secondary | ICD-10-CM | POA: Diagnosis not present

## 2020-03-12 DIAGNOSIS — E1129 Type 2 diabetes mellitus with other diabetic kidney complication: Secondary | ICD-10-CM | POA: Diagnosis not present

## 2020-03-12 DIAGNOSIS — D509 Iron deficiency anemia, unspecified: Secondary | ICD-10-CM | POA: Diagnosis not present

## 2020-03-12 DIAGNOSIS — N186 End stage renal disease: Secondary | ICD-10-CM | POA: Diagnosis not present

## 2020-03-12 DIAGNOSIS — N2581 Secondary hyperparathyroidism of renal origin: Secondary | ICD-10-CM | POA: Diagnosis not present

## 2020-03-12 DIAGNOSIS — Z992 Dependence on renal dialysis: Secondary | ICD-10-CM | POA: Diagnosis not present

## 2020-03-14 ENCOUNTER — Other Ambulatory Visit: Payer: Self-pay

## 2020-03-14 ENCOUNTER — Ambulatory Visit (INDEPENDENT_AMBULATORY_CARE_PROVIDER_SITE_OTHER): Payer: Medicare Other | Admitting: Neurology

## 2020-03-14 DIAGNOSIS — R55 Syncope and collapse: Secondary | ICD-10-CM | POA: Diagnosis not present

## 2020-03-15 DIAGNOSIS — N2581 Secondary hyperparathyroidism of renal origin: Secondary | ICD-10-CM | POA: Diagnosis not present

## 2020-03-15 DIAGNOSIS — N186 End stage renal disease: Secondary | ICD-10-CM | POA: Diagnosis not present

## 2020-03-15 DIAGNOSIS — E1129 Type 2 diabetes mellitus with other diabetic kidney complication: Secondary | ICD-10-CM | POA: Diagnosis not present

## 2020-03-15 DIAGNOSIS — Z992 Dependence on renal dialysis: Secondary | ICD-10-CM | POA: Diagnosis not present

## 2020-03-15 DIAGNOSIS — D509 Iron deficiency anemia, unspecified: Secondary | ICD-10-CM | POA: Diagnosis not present

## 2020-03-16 ENCOUNTER — Ambulatory Visit
Admission: RE | Admit: 2020-03-16 | Discharge: 2020-03-16 | Disposition: A | Discharge status: Home / Self Care | Payer: Medicare Other | Source: Ambulatory Visit | Attending: Neurology | Admitting: Neurology

## 2020-03-16 ENCOUNTER — Other Ambulatory Visit: Payer: Self-pay

## 2020-03-16 ENCOUNTER — Other Ambulatory Visit: Payer: Self-pay | Admitting: Neurology

## 2020-03-16 DIAGNOSIS — R55 Syncope and collapse: Secondary | ICD-10-CM

## 2020-03-16 DIAGNOSIS — I6782 Cerebral ischemia: Secondary | ICD-10-CM | POA: Diagnosis not present

## 2020-03-17 DIAGNOSIS — D509 Iron deficiency anemia, unspecified: Secondary | ICD-10-CM | POA: Diagnosis not present

## 2020-03-17 DIAGNOSIS — Z992 Dependence on renal dialysis: Secondary | ICD-10-CM | POA: Diagnosis not present

## 2020-03-17 DIAGNOSIS — E1129 Type 2 diabetes mellitus with other diabetic kidney complication: Secondary | ICD-10-CM | POA: Diagnosis not present

## 2020-03-17 DIAGNOSIS — N186 End stage renal disease: Secondary | ICD-10-CM | POA: Diagnosis not present

## 2020-03-17 DIAGNOSIS — N2581 Secondary hyperparathyroidism of renal origin: Secondary | ICD-10-CM | POA: Diagnosis not present

## 2020-03-18 ENCOUNTER — Ambulatory Visit: Payer: Medicare Other | Admitting: Podiatry

## 2020-03-19 DIAGNOSIS — D509 Iron deficiency anemia, unspecified: Secondary | ICD-10-CM | POA: Diagnosis not present

## 2020-03-19 DIAGNOSIS — E1129 Type 2 diabetes mellitus with other diabetic kidney complication: Secondary | ICD-10-CM | POA: Diagnosis not present

## 2020-03-19 DIAGNOSIS — N186 End stage renal disease: Secondary | ICD-10-CM | POA: Diagnosis not present

## 2020-03-19 DIAGNOSIS — N2581 Secondary hyperparathyroidism of renal origin: Secondary | ICD-10-CM | POA: Diagnosis not present

## 2020-03-19 DIAGNOSIS — Z992 Dependence on renal dialysis: Secondary | ICD-10-CM | POA: Diagnosis not present

## 2020-03-22 ENCOUNTER — Encounter: Payer: Self-pay | Admitting: Neurology

## 2020-03-22 ENCOUNTER — Ambulatory Visit (INDEPENDENT_AMBULATORY_CARE_PROVIDER_SITE_OTHER): Payer: Medicare Other | Admitting: Neurology

## 2020-03-22 ENCOUNTER — Other Ambulatory Visit: Payer: Self-pay

## 2020-03-22 VITALS — BP 146/74 | HR 65 | Ht 63.0 in | Wt 138.0 lb

## 2020-03-22 DIAGNOSIS — N2581 Secondary hyperparathyroidism of renal origin: Secondary | ICD-10-CM | POA: Diagnosis not present

## 2020-03-22 DIAGNOSIS — N186 End stage renal disease: Secondary | ICD-10-CM | POA: Diagnosis not present

## 2020-03-22 DIAGNOSIS — R404 Transient alteration of awareness: Secondary | ICD-10-CM

## 2020-03-22 DIAGNOSIS — Z992 Dependence on renal dialysis: Secondary | ICD-10-CM | POA: Diagnosis not present

## 2020-03-22 DIAGNOSIS — E1129 Type 2 diabetes mellitus with other diabetic kidney complication: Secondary | ICD-10-CM | POA: Diagnosis not present

## 2020-03-22 DIAGNOSIS — D509 Iron deficiency anemia, unspecified: Secondary | ICD-10-CM | POA: Diagnosis not present

## 2020-03-22 DIAGNOSIS — R55 Syncope and collapse: Secondary | ICD-10-CM | POA: Diagnosis not present

## 2020-03-22 MED ORDER — LAMOTRIGINE 25 MG PO TABS
ORAL_TABLET | ORAL | 6 refills | Status: DC
Start: 2020-03-22 — End: 2020-06-03

## 2020-03-22 NOTE — Patient Instructions (Signed)
1. Start Lamotrigine 25mg : take 1 tablet twice a day for 2 weeks, then increase to 2 tablets twice a day  2. Keep a calendar of the episodes  3. Follow-up in 4 months, call for any changes

## 2020-03-22 NOTE — Progress Notes (Signed)
NEUROLOGY FOLLOW UP OFFICE NOTE  Julie Brewer [id number] 1951-01-18  HISTORY OF PRESENT ILLNESS: I had the pleasure of seeing Julie Brewer in follow-up in the neurology clinic on 03/22/2020.  The patient was last seen 6 weeks ago for recurrent episodes of loss of consciousness. She is again accompanied by her daughter Julie Brewer who helps supplement the history today.  Her daughter reported episodes where she would make a noise like a growl, then start falling over to the right side. She would be staring with eyes open, not responding for a minute. She is a little disoriented and clammy after. She has had a loop recorder since 2018 with no changes during events.   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 Her routine and 24-hour EEGs were normal, EKG showed irregular rhythm, typical events were not captured. She and her daughter deny any events since December 2021. She was having them around once a month. She denies any headaches, dizziness, no falls.   History on Initial Assessment 02/05/2020: This is a pleasant 70 year old right-handed woman with a history of hypertension, hyperlipidemia, diabetes, CKD on dialysis, legally blind, stroke in 2010 with residual mild left-sided weakness, presenting for evaluation of recurrent episodes of loss of consciousness. The episodes started around 2-3 years ago. There is no prior warning, she notes that she is eating when they happen most of the time. She has been living with her daughter Julie Brewer since March 2021, and they report episodes around once a month, however she had 2 in December, last episode was 01/23/20. [name] reports they occur either when eating breakfast or dinner, she would make a noise like a growl, then starts falling over to the right side. She is staring with eyes open, not  responding, and would be out for a minute. No jerking/convulsive activity seen. She would be a little disoriented and clammy after. She feels tired and would feel the urge to have a bowel movement. She has had urinary incontinence with some of the episodes. They report a bigger episode in January 2021 when she was more confused and did not recognize her family after, she was brought [facility], there was note of episodes of hypoglycemia in the past, she had a loop recorder due to these episodes which was normal on interrogation. She denies any olfactory/gustatory hallucinations, deja vu, rising epigastric sensation, focal numbness/tingling/weakness, myoclonic jerks. She denies any significant headaches, dizziness, dysarthria/dysphagia. She lost her vision in 2011. She has dialysis T-Th-F. Her mother used to have seizures and was found to have a brain tumor. Otherwise she had a normal birth and early development.  There is no history of febrile convulsions, CNS infections such as meningitis/encephalitis, significant traumatic brain injury, neurosurgical procedures.   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   CHF (congestive heart failure) (HCC)    Chronic kidney disease    Tu/Th/Sa   Diabetes mellitus    Type II per Dr [name]  (patient said type I)   Fibroid    GERD (gastroesophageal reflux disease)    Glaucoma    Hyperlipidemia    Hypertension    Iron deficiency anemia 03/09/2016   Peripheral vascular disease ([place])  Pneumonia 2006   PONV (postoperative nausea and vomiting)    Shortness of breath dyspnea    with exdrtion, "Walkling too fast"   Stroke ([place])    no residual    MEDICATIONS: Current Outpatient Medications on File Prior to Visit  Medication Sig Dispense Refill   acetaminophen (TYLENOL) 325 MG tablet Take 2 tablets (650 mg  total) by mouth every 6 (six) hours as needed for mild pain (or Fever >/= 101).     amLODipine (NORVASC) 10 MG tablet SMARTSIG:.5 Tablet(s) By Mouth Daily     aspirin 325 MG EC tablet Take 325 mg by mouth daily.      atorvastatin (LIPITOR) 20 MG tablet Take 1 tablet by mouth once daily 90 tablet 3   bisacodyl (DULCOLAX) 5 MG EC tablet Take 5 mg by mouth daily as needed for moderate constipation.     Blood Glucose Monitoring Suppl (FREESTYLE FREEDOM) KIT Use to check blood sugar once a day dx code 1 kit 1   calcitRIOL (ROCALTROL) 0.5 MCG capsule Take by mouth.     cilostazol (PLETAL) 100 MG tablet Take 1 tablet by mouth twice daily 180 tablet 0   docusate sodium (COLACE) 100 MG capsule Take 200 mg by mouth daily.      ferrous sulfate 325 (65 FE) MG tablet Take 650 mg by mouth daily.     gabapentin (NEURONTIN) 100 MG capsule TAKE 1 CAPSULE BY MOUTH TWICE DAILY AS NEEDED 180 capsule 1   glucose blood (FREESTYLE LITE) test strip USE AS INSTRUCTED TO CHECK BLOOD SUGAR ONCE DAILY. 50 each 3   insulin glargine, 1 Unit Dial, (TOUJEO SOLOSTAR) 300 UNIT/ML Solostar Pen Inject 15 Units into the skin daily. 15 mL 3   Insulin Pen Needle 31G X 5 MM MISC Use with pen 100 each 1   insulin regular (NOVOLIN R) 100 units/mL injection Inject into the skin 2 (two) times daily before a meal. Inject 10 units under the skin before breakfast and 5-7 units before dinner.     lidocaine (XYLOCAINE) 2 % injection Inject into the skin.      lidocaine-prilocaine (EMLA) cream SMARTSIG:Sparingly Topical     Methoxy PEG-Epoetin Beta (MIRCERA IJ)      omeprazole (PRILOSEC) 20 MG capsule TAKE 1 CAPSULE BY MOUTH ONCE DAILY ( DUE FOR OFFICE VISIT THIS MONTH) 90 capsule 0   oxyCODONE (ROXICODONE) 5 MG immediate release tablet Take 1 tablet (5 mg total) by mouth every 4 (four) hours as needed. 15 tablet 0   Vitamin D, Ergocalciferol, (DRISDOL) 1.25 MG (50000 UNIT) CAPS capsule Take 1 capsule by mouth once a week 12  capsule 0   No current facility-administered medications on file prior to visit.    ALLERGIES: Allergies  Allergen Reactions   Morphine And Related Other (See Comments)    Hallucinations    Penicillins Swelling and Rash    Throat swelling Did it involve swelling of the face/tongue/throat, SOB, or low BP? Yes Did it involve sudden or severe rash/hives, skin peeling, or any reaction on the inside of your mouth or nose? Yes Did you need to seek medical attention at a hospital or doctor's office? Yes When did it last happen?young child If all above answers are NO, may proceed with cephalosporin use.    FAMILY HISTORY: Family History  Problem Relation Age of Onset   Cancer Mother    Heart disease Mother    Diabetes Father    Cancer Brother    Cancer Brother  Throat cancer Brother     SOCIAL HISTORY: Social History   Socioeconomic History   Marital status: Legally Separated    Spouse name: Not on file   Number of children: Not on file   Years of education: Not on file   Highest education level: Not on file  Occupational History   Not on file  Tobacco Use   Smoking status: Current Every Day Smoker    Packs/day: 1.00    Years: 30.00    Pack years: 30.00    Types: Cigarettes   Smokeless tobacco: Never Used  Vaping Use   Vaping Use: Never used  Substance and Sexual Activity   Alcohol use: Not Currently    Comment: socially   Drug use: Not Currently    Types: Methylphenidate   Sexual activity: Never    Birth control/protection: Post-menopausal, Surgical    Comment: Hysterectomy  Other Topics Concern   Not on file  Social History Narrative   Right handed   Lives with daughter in a one story home   Drinks caffeine   Social Determinants of Health   Financial Resource Strain: Not on file  Food Insecurity: Not on file  Transportation Needs: Not on file  Physical Activity: Not on file  Stress: Not on file  Social Connections: Not on  file  Intimate Partner Violence: Not on file     PHYSICAL EXAM: Vitals:   03/22/20 0829  BP: (!) 146/74  Pulse: 65  SpO2: 100%   General: No acute distress Head:  Normocephalic/atraumatic Skin/Extremities: No rash, no edema Neurological Exam: alert and awake. No aphasia or dysarthria. Fund of knowledge is appropriate.  Attention and concentration are normal.   Cranial nerves: legally blind. Extraocular movements intact with no nystagmus. No facial asymmetry.  Motor: Bulk and tone normal, muscle strength 5/5 throughout with no pronator drift.   Finger to nose testing intact.  Gait slow and cautious with cane.   IMPRESSION: This is a pleasant 70 yo RH woman with a history of hypertension, hyperlipidemia, diabetes, CKD on dialysis, legally blind, stroke in 2010 with residual mild left-sided weakness, with recurrent episodes of loss of consciousness where she is described as staring, unresponsive for a minute, followed by confusion. She has had an unremarkable cardiac evaluation and has had a loop recorder since 2018, episodes have not shown any significant arrhythmia. Her brain MRI and 24-hour EEG were unremarkable. We discussed etiology of episodes is unclear, we can do a therapeutic trial with seizure medication and monitor response. She would like to start medication and keep a calendar of events. We discussed starting Lamotrigine, side effects including Kathreen Cosier syndrome, were discussed. Start Lamotrigine 29m BID x 2 weeks, then increase to 553mBID. She does not drive. Follow-up in 4 months, they know to call for any changes.   Thank you for allowing me to participate in her care.  Please do not hesitate to call for any questions or concerns.   KaEllouise NewerM.D.   CC: Dr. WeJustin Mend

## 2020-03-24 DIAGNOSIS — E1129 Type 2 diabetes mellitus with other diabetic kidney complication: Secondary | ICD-10-CM | POA: Diagnosis not present

## 2020-03-24 DIAGNOSIS — D509 Iron deficiency anemia, unspecified: Secondary | ICD-10-CM | POA: Diagnosis not present

## 2020-03-24 DIAGNOSIS — N186 End stage renal disease: Secondary | ICD-10-CM | POA: Diagnosis not present

## 2020-03-24 DIAGNOSIS — Z992 Dependence on renal dialysis: Secondary | ICD-10-CM | POA: Diagnosis not present

## 2020-03-24 DIAGNOSIS — N2581 Secondary hyperparathyroidism of renal origin: Secondary | ICD-10-CM | POA: Diagnosis not present

## 2020-03-26 DIAGNOSIS — E1129 Type 2 diabetes mellitus with other diabetic kidney complication: Secondary | ICD-10-CM | POA: Diagnosis not present

## 2020-03-26 DIAGNOSIS — Z992 Dependence on renal dialysis: Secondary | ICD-10-CM | POA: Diagnosis not present

## 2020-03-26 DIAGNOSIS — N2581 Secondary hyperparathyroidism of renal origin: Secondary | ICD-10-CM | POA: Diagnosis not present

## 2020-03-26 DIAGNOSIS — N186 End stage renal disease: Secondary | ICD-10-CM | POA: Diagnosis not present

## 2020-03-26 DIAGNOSIS — D509 Iron deficiency anemia, unspecified: Secondary | ICD-10-CM | POA: Diagnosis not present

## 2020-03-28 ENCOUNTER — Other Ambulatory Visit: Payer: Self-pay | Admitting: Endocrinology

## 2020-03-29 DIAGNOSIS — Z992 Dependence on renal dialysis: Secondary | ICD-10-CM | POA: Diagnosis not present

## 2020-03-29 DIAGNOSIS — E1122 Type 2 diabetes mellitus with diabetic chronic kidney disease: Secondary | ICD-10-CM | POA: Diagnosis not present

## 2020-03-29 DIAGNOSIS — D509 Iron deficiency anemia, unspecified: Secondary | ICD-10-CM | POA: Diagnosis not present

## 2020-03-29 DIAGNOSIS — E1129 Type 2 diabetes mellitus with other diabetic kidney complication: Secondary | ICD-10-CM | POA: Diagnosis not present

## 2020-03-29 DIAGNOSIS — N186 End stage renal disease: Secondary | ICD-10-CM | POA: Diagnosis not present

## 2020-03-29 DIAGNOSIS — N2581 Secondary hyperparathyroidism of renal origin: Secondary | ICD-10-CM | POA: Diagnosis not present

## 2020-03-29 DIAGNOSIS — D631 Anemia in chronic kidney disease: Secondary | ICD-10-CM | POA: Diagnosis not present

## 2020-03-29 NOTE — Telephone Encounter (Signed)
Please advise. Should this be continue with PCP for refill?

## 2020-03-30 ENCOUNTER — Other Ambulatory Visit: Payer: Self-pay | Admitting: *Deleted

## 2020-03-30 MED ORDER — OMEPRAZOLE 20 MG PO CPDR: DELAYED_RELEASE_CAPSULE | ORAL | 0 refills | Status: DC

## 2020-03-31 DIAGNOSIS — N2581 Secondary hyperparathyroidism of renal origin: Secondary | ICD-10-CM | POA: Diagnosis not present

## 2020-03-31 DIAGNOSIS — N186 End stage renal disease: Secondary | ICD-10-CM | POA: Diagnosis not present

## 2020-03-31 DIAGNOSIS — E1129 Type 2 diabetes mellitus with other diabetic kidney complication: Secondary | ICD-10-CM | POA: Diagnosis not present

## 2020-03-31 DIAGNOSIS — Z992 Dependence on renal dialysis: Secondary | ICD-10-CM | POA: Diagnosis not present

## 2020-03-31 DIAGNOSIS — D509 Iron deficiency anemia, unspecified: Secondary | ICD-10-CM | POA: Diagnosis not present

## 2020-03-31 DIAGNOSIS — D631 Anemia in chronic kidney disease: Secondary | ICD-10-CM | POA: Diagnosis not present

## 2020-04-01 ENCOUNTER — Ambulatory Visit (INDEPENDENT_AMBULATORY_CARE_PROVIDER_SITE_OTHER): Payer: Medicare Other | Admitting: Podiatry

## 2020-04-01 ENCOUNTER — Encounter: Payer: Self-pay | Admitting: Podiatry

## 2020-04-01 ENCOUNTER — Other Ambulatory Visit: Payer: Self-pay

## 2020-04-01 DIAGNOSIS — M79674 Pain in right toe(s): Secondary | ICD-10-CM | POA: Diagnosis not present

## 2020-04-01 DIAGNOSIS — M79675 Pain in left toe(s): Secondary | ICD-10-CM

## 2020-04-01 DIAGNOSIS — B351 Tinea unguium: Secondary | ICD-10-CM

## 2020-04-01 DIAGNOSIS — E1151 Type 2 diabetes mellitus with diabetic peripheral angiopathy without gangrene: Secondary | ICD-10-CM

## 2020-04-01 NOTE — Progress Notes (Signed)
  Subjective:  Patient ID: Julie Brewer, female    DOB: Dec 02, 1950,  MRN: [id number]  Chief Complaint  Patient presents with  . Nail Problem    Nail care    70 y.o. female returns for the above complaint.  Patient is a type II diabetic whose A1c is 6.1.  Patient presents with thickened elongated mycotic toenails x10.  Patient states that she is not able to cut it herself.  She would like to bring [place] to debride them down.  She denies any other acute complaints.  Objective:  There were no vitals filed for this visit. Podiatric Exam: Vascular: dorsalis pedis and posterior tibial pulses are palpable bilateral. Capillary return is immediate. Temperature gradient is WNL. Skin turgor WNL  Sensorium: Normal Semmes Weinstein monofilament test. Normal tactile sensation bilaterally. Nail Exam: Pt has thick disfigured discolored nails with subungual debris noted bilateral entire nail hallux through fifth toenails Ulcer Exam: There is no evidence of ulcer or pre-ulcerative changes or infection. Orthopedic Exam: Muscle tone and strength are WNL. No limitations in general ROM. No crepitus or effusions noted. HAV  B/L.  Hammer toes 2-5  B/L. Skin: No Porokeratosis. No infection or ulcers.  Submet 3 hyperkeratotic lesion/callus x2.  Pain on palpation.  Assessment & Plan:  Patient was evaluated and treated and all questions answered.  Hammertoe contractures bilateral two through five -I explained to patient the etiology of hammertoe contracture as well as treatment options were discussed. Given that patient has underlying diabetes with contractures of the hammertoes I believe patient will benefit from diabetic shoes to prevent ulceration leading to amputation. Patient agrees with the plan. -Patient is currently wearing diabetic shoes.  No acute complaints noted.  Bilateral submet 3 hyperkeratotic lesion/callus x2 -Resolving  Onychomycosis with pain  -Nails palliatively debrided as  below. -Educated on self-care  Procedure: Nail Debridement Rationale: pain  Type of Debridement: manual, sharp debridement. Instrumentation: Nail nipper, rotary burr. Number of Nails: 10  Procedures and Treatment: Consent by patient was obtained for treatment procedures. The patient understood the discussion of treatment and procedures well. All questions were answered thoroughly reviewed. Debridement of mycotic and hypertrophic toenails, 1 through 5 bilateral and clearing of subungual debris. No ulceration, no infection noted.  Return Visit-Office Procedure: Patient instructed to return to the office for a follow up visit 3 months for continued evaluation and treatment.  Boneta Lucks, DPM    No follow-ups on file.

## 2020-04-02 DIAGNOSIS — E1129 Type 2 diabetes mellitus with other diabetic kidney complication: Secondary | ICD-10-CM | POA: Diagnosis not present

## 2020-04-02 DIAGNOSIS — N186 End stage renal disease: Secondary | ICD-10-CM | POA: Diagnosis not present

## 2020-04-02 DIAGNOSIS — Z992 Dependence on renal dialysis: Secondary | ICD-10-CM | POA: Diagnosis not present

## 2020-04-02 DIAGNOSIS — N2581 Secondary hyperparathyroidism of renal origin: Secondary | ICD-10-CM | POA: Diagnosis not present

## 2020-04-02 DIAGNOSIS — D631 Anemia in chronic kidney disease: Secondary | ICD-10-CM | POA: Diagnosis not present

## 2020-04-02 DIAGNOSIS — D509 Iron deficiency anemia, unspecified: Secondary | ICD-10-CM | POA: Diagnosis not present

## 2020-04-04 ENCOUNTER — Other Ambulatory Visit: Payer: Medicare Other

## 2020-04-04 ENCOUNTER — Other Ambulatory Visit: Payer: Self-pay | Admitting: Vascular Surgery

## 2020-04-04 NOTE — Procedures (Signed)
ELECTROENCEPHALOGRAM REPORT  Dates of Recording: 03/14/2020 3:19PM to 03/15/2020 4:14PM  Patient's Name: Julie Brewer MRN: [id number] Date of Birth: Jun 16, 1950  Referring Provider: Dr. Ellouise Newer  Procedure: 24-hour ambulatory video EEG  History: This is a 70 year old woman with recurrent episodes of loss of consciousness. Loop monitor no significant cardiac abnormalities during events. EEG for classification.  Medications:  TYLENOL 325 MG tablet NORVASC 10 MG tablet aspirin 325 MG EC tablet LIPITOR 20 MG tablet DULCOLAX 5 MG EC tablet ROCALTROL 0.5 MCG capsule PLETAL 100 MG tablet COLACE 100 MG capsule 65 FE MG tablet NEURONTIN 100 MG capsule LANTUS 100 UNIT/ML injection NOVOLIN R) 1I00 units/mL injection XYLOCAINE 2 % injection EMLA cream PRILOSEC 20 MG capsule DRISDOL 1.25 MG (50000 UNIT) CAPS capsule ROXICODONE 5 MG immediate release tablet  Technical Summary: This is a 24-hour multichannel digital video EEG recording measured by the international 10-20 system with electrodes applied with paste and impedances below 5000 ohms performed as portable with EKG monitoring.  The digital EEG was referentially recorded, reformatted, and digitally filtered in a variety of bipolar and referential montages for optimal display.    DESCRIPTION OF RECORDING: During maximal wakefulness, the background activity consisted of a symmetric 9 Hz posterior dominant rhythm which was reactive to eye opening.  There were no epileptiform discharges or focal slowing seen in wakefulness.  During the recording, the patient progresses through wakefulness, drowsiness, and Stage 2 sleep.  Again, there were no epileptiform discharges seen.  Events: There were no push button events.   There were no electrographic seizures seen.  EKG lead was unremarkable.  IMPRESSION: This 24-hour ambulatory video EEG study is normal.    CLINICAL CORRELATION: A normal EEG does not exclude a clinical diagnosis  of epilepsy. Typical events were not captured. If further clinical questions remain, inpatient video EEG monitoring may be helpful.   Ellouise Newer, M.D.

## 2020-04-05 DIAGNOSIS — N186 End stage renal disease: Secondary | ICD-10-CM | POA: Diagnosis not present

## 2020-04-05 DIAGNOSIS — D509 Iron deficiency anemia, unspecified: Secondary | ICD-10-CM | POA: Diagnosis not present

## 2020-04-05 DIAGNOSIS — N2581 Secondary hyperparathyroidism of renal origin: Secondary | ICD-10-CM | POA: Diagnosis not present

## 2020-04-05 DIAGNOSIS — D631 Anemia in chronic kidney disease: Secondary | ICD-10-CM | POA: Diagnosis not present

## 2020-04-05 DIAGNOSIS — Z992 Dependence on renal dialysis: Secondary | ICD-10-CM | POA: Diagnosis not present

## 2020-04-05 DIAGNOSIS — E1129 Type 2 diabetes mellitus with other diabetic kidney complication: Secondary | ICD-10-CM | POA: Diagnosis not present

## 2020-04-06 ENCOUNTER — Ambulatory Visit: Payer: Medicare Other | Admitting: Endocrinology

## 2020-04-07 DIAGNOSIS — N186 End stage renal disease: Secondary | ICD-10-CM | POA: Diagnosis not present

## 2020-04-07 DIAGNOSIS — E1129 Type 2 diabetes mellitus with other diabetic kidney complication: Secondary | ICD-10-CM | POA: Diagnosis not present

## 2020-04-07 DIAGNOSIS — Z992 Dependence on renal dialysis: Secondary | ICD-10-CM | POA: Diagnosis not present

## 2020-04-07 DIAGNOSIS — D509 Iron deficiency anemia, unspecified: Secondary | ICD-10-CM | POA: Diagnosis not present

## 2020-04-07 DIAGNOSIS — D631 Anemia in chronic kidney disease: Secondary | ICD-10-CM | POA: Diagnosis not present

## 2020-04-07 DIAGNOSIS — N2581 Secondary hyperparathyroidism of renal origin: Secondary | ICD-10-CM | POA: Diagnosis not present

## 2020-04-09 DIAGNOSIS — N2581 Secondary hyperparathyroidism of renal origin: Secondary | ICD-10-CM | POA: Diagnosis not present

## 2020-04-09 DIAGNOSIS — D509 Iron deficiency anemia, unspecified: Secondary | ICD-10-CM | POA: Diagnosis not present

## 2020-04-09 DIAGNOSIS — D631 Anemia in chronic kidney disease: Secondary | ICD-10-CM | POA: Diagnosis not present

## 2020-04-09 DIAGNOSIS — N186 End stage renal disease: Secondary | ICD-10-CM | POA: Diagnosis not present

## 2020-04-09 DIAGNOSIS — E1129 Type 2 diabetes mellitus with other diabetic kidney complication: Secondary | ICD-10-CM | POA: Diagnosis not present

## 2020-04-09 DIAGNOSIS — Z992 Dependence on renal dialysis: Secondary | ICD-10-CM | POA: Diagnosis not present

## 2020-04-12 DIAGNOSIS — D631 Anemia in chronic kidney disease: Secondary | ICD-10-CM | POA: Diagnosis not present

## 2020-04-12 DIAGNOSIS — E1129 Type 2 diabetes mellitus with other diabetic kidney complication: Secondary | ICD-10-CM | POA: Diagnosis not present

## 2020-04-12 DIAGNOSIS — D509 Iron deficiency anemia, unspecified: Secondary | ICD-10-CM | POA: Diagnosis not present

## 2020-04-12 DIAGNOSIS — N186 End stage renal disease: Secondary | ICD-10-CM | POA: Diagnosis not present

## 2020-04-12 DIAGNOSIS — N2581 Secondary hyperparathyroidism of renal origin: Secondary | ICD-10-CM | POA: Diagnosis not present

## 2020-04-12 DIAGNOSIS — Z992 Dependence on renal dialysis: Secondary | ICD-10-CM | POA: Diagnosis not present

## 2020-04-14 DIAGNOSIS — D631 Anemia in chronic kidney disease: Secondary | ICD-10-CM | POA: Diagnosis not present

## 2020-04-14 DIAGNOSIS — Z992 Dependence on renal dialysis: Secondary | ICD-10-CM | POA: Diagnosis not present

## 2020-04-14 DIAGNOSIS — N2581 Secondary hyperparathyroidism of renal origin: Secondary | ICD-10-CM | POA: Diagnosis not present

## 2020-04-14 DIAGNOSIS — N186 End stage renal disease: Secondary | ICD-10-CM | POA: Diagnosis not present

## 2020-04-14 DIAGNOSIS — E1129 Type 2 diabetes mellitus with other diabetic kidney complication: Secondary | ICD-10-CM | POA: Diagnosis not present

## 2020-04-14 DIAGNOSIS — D509 Iron deficiency anemia, unspecified: Secondary | ICD-10-CM | POA: Diagnosis not present

## 2020-04-16 DIAGNOSIS — Z992 Dependence on renal dialysis: Secondary | ICD-10-CM | POA: Diagnosis not present

## 2020-04-16 DIAGNOSIS — D631 Anemia in chronic kidney disease: Secondary | ICD-10-CM | POA: Diagnosis not present

## 2020-04-16 DIAGNOSIS — E1129 Type 2 diabetes mellitus with other diabetic kidney complication: Secondary | ICD-10-CM | POA: Diagnosis not present

## 2020-04-16 DIAGNOSIS — N2581 Secondary hyperparathyroidism of renal origin: Secondary | ICD-10-CM | POA: Diagnosis not present

## 2020-04-16 DIAGNOSIS — D509 Iron deficiency anemia, unspecified: Secondary | ICD-10-CM | POA: Diagnosis not present

## 2020-04-16 DIAGNOSIS — N186 End stage renal disease: Secondary | ICD-10-CM | POA: Diagnosis not present

## 2020-04-19 DIAGNOSIS — D509 Iron deficiency anemia, unspecified: Secondary | ICD-10-CM | POA: Diagnosis not present

## 2020-04-19 DIAGNOSIS — E1129 Type 2 diabetes mellitus with other diabetic kidney complication: Secondary | ICD-10-CM | POA: Diagnosis not present

## 2020-04-19 DIAGNOSIS — N186 End stage renal disease: Secondary | ICD-10-CM | POA: Diagnosis not present

## 2020-04-19 DIAGNOSIS — D631 Anemia in chronic kidney disease: Secondary | ICD-10-CM | POA: Diagnosis not present

## 2020-04-19 DIAGNOSIS — Z992 Dependence on renal dialysis: Secondary | ICD-10-CM | POA: Diagnosis not present

## 2020-04-19 DIAGNOSIS — N2581 Secondary hyperparathyroidism of renal origin: Secondary | ICD-10-CM | POA: Diagnosis not present

## 2020-04-21 DIAGNOSIS — Z992 Dependence on renal dialysis: Secondary | ICD-10-CM | POA: Diagnosis not present

## 2020-04-21 DIAGNOSIS — E1129 Type 2 diabetes mellitus with other diabetic kidney complication: Secondary | ICD-10-CM | POA: Diagnosis not present

## 2020-04-21 DIAGNOSIS — D631 Anemia in chronic kidney disease: Secondary | ICD-10-CM | POA: Diagnosis not present

## 2020-04-21 DIAGNOSIS — N186 End stage renal disease: Secondary | ICD-10-CM | POA: Diagnosis not present

## 2020-04-21 DIAGNOSIS — D509 Iron deficiency anemia, unspecified: Secondary | ICD-10-CM | POA: Diagnosis not present

## 2020-04-21 DIAGNOSIS — N2581 Secondary hyperparathyroidism of renal origin: Secondary | ICD-10-CM | POA: Diagnosis not present

## 2020-04-23 DIAGNOSIS — N2581 Secondary hyperparathyroidism of renal origin: Secondary | ICD-10-CM | POA: Diagnosis not present

## 2020-04-23 DIAGNOSIS — E1129 Type 2 diabetes mellitus with other diabetic kidney complication: Secondary | ICD-10-CM | POA: Diagnosis not present

## 2020-04-23 DIAGNOSIS — Z992 Dependence on renal dialysis: Secondary | ICD-10-CM | POA: Diagnosis not present

## 2020-04-23 DIAGNOSIS — D509 Iron deficiency anemia, unspecified: Secondary | ICD-10-CM | POA: Diagnosis not present

## 2020-04-23 DIAGNOSIS — N186 End stage renal disease: Secondary | ICD-10-CM | POA: Diagnosis not present

## 2020-04-23 DIAGNOSIS — D631 Anemia in chronic kidney disease: Secondary | ICD-10-CM | POA: Diagnosis not present

## 2020-04-26 DIAGNOSIS — D509 Iron deficiency anemia, unspecified: Secondary | ICD-10-CM | POA: Diagnosis not present

## 2020-04-26 DIAGNOSIS — N186 End stage renal disease: Secondary | ICD-10-CM | POA: Diagnosis not present

## 2020-04-26 DIAGNOSIS — Z992 Dependence on renal dialysis: Secondary | ICD-10-CM | POA: Diagnosis not present

## 2020-04-26 DIAGNOSIS — N2581 Secondary hyperparathyroidism of renal origin: Secondary | ICD-10-CM | POA: Diagnosis not present

## 2020-04-26 DIAGNOSIS — E1129 Type 2 diabetes mellitus with other diabetic kidney complication: Secondary | ICD-10-CM | POA: Diagnosis not present

## 2020-04-26 DIAGNOSIS — D631 Anemia in chronic kidney disease: Secondary | ICD-10-CM | POA: Diagnosis not present

## 2020-04-28 DIAGNOSIS — D631 Anemia in chronic kidney disease: Secondary | ICD-10-CM | POA: Diagnosis not present

## 2020-04-28 DIAGNOSIS — N186 End stage renal disease: Secondary | ICD-10-CM | POA: Diagnosis not present

## 2020-04-28 DIAGNOSIS — Z992 Dependence on renal dialysis: Secondary | ICD-10-CM | POA: Diagnosis not present

## 2020-04-28 DIAGNOSIS — D509 Iron deficiency anemia, unspecified: Secondary | ICD-10-CM | POA: Diagnosis not present

## 2020-04-28 DIAGNOSIS — N2581 Secondary hyperparathyroidism of renal origin: Secondary | ICD-10-CM | POA: Diagnosis not present

## 2020-04-28 DIAGNOSIS — E1129 Type 2 diabetes mellitus with other diabetic kidney complication: Secondary | ICD-10-CM | POA: Diagnosis not present

## 2020-04-29 DIAGNOSIS — E1122 Type 2 diabetes mellitus with diabetic chronic kidney disease: Secondary | ICD-10-CM | POA: Diagnosis not present

## 2020-04-29 DIAGNOSIS — N186 End stage renal disease: Secondary | ICD-10-CM | POA: Diagnosis not present

## 2020-04-29 DIAGNOSIS — Z992 Dependence on renal dialysis: Secondary | ICD-10-CM | POA: Diagnosis not present

## 2020-04-30 DIAGNOSIS — D509 Iron deficiency anemia, unspecified: Secondary | ICD-10-CM | POA: Diagnosis not present

## 2020-04-30 DIAGNOSIS — D631 Anemia in chronic kidney disease: Secondary | ICD-10-CM | POA: Diagnosis not present

## 2020-04-30 DIAGNOSIS — N186 End stage renal disease: Secondary | ICD-10-CM | POA: Diagnosis not present

## 2020-04-30 DIAGNOSIS — E1129 Type 2 diabetes mellitus with other diabetic kidney complication: Secondary | ICD-10-CM | POA: Diagnosis not present

## 2020-04-30 DIAGNOSIS — Z992 Dependence on renal dialysis: Secondary | ICD-10-CM | POA: Diagnosis not present

## 2020-04-30 DIAGNOSIS — N2581 Secondary hyperparathyroidism of renal origin: Secondary | ICD-10-CM | POA: Diagnosis not present

## 2020-05-03 ENCOUNTER — Other Ambulatory Visit: Payer: Self-pay | Admitting: *Deleted

## 2020-05-03 ENCOUNTER — Telehealth: Payer: Self-pay | Admitting: Endocrinology

## 2020-05-03 DIAGNOSIS — N186 End stage renal disease: Secondary | ICD-10-CM | POA: Diagnosis not present

## 2020-05-03 DIAGNOSIS — Z992 Dependence on renal dialysis: Secondary | ICD-10-CM | POA: Diagnosis not present

## 2020-05-03 DIAGNOSIS — E1165 Type 2 diabetes mellitus with hyperglycemia: Secondary | ICD-10-CM

## 2020-05-03 DIAGNOSIS — N2581 Secondary hyperparathyroidism of renal origin: Secondary | ICD-10-CM | POA: Diagnosis not present

## 2020-05-03 DIAGNOSIS — E1129 Type 2 diabetes mellitus with other diabetic kidney complication: Secondary | ICD-10-CM | POA: Diagnosis not present

## 2020-05-03 DIAGNOSIS — D509 Iron deficiency anemia, unspecified: Secondary | ICD-10-CM | POA: Diagnosis not present

## 2020-05-03 DIAGNOSIS — D631 Anemia in chronic kidney disease: Secondary | ICD-10-CM | POA: Diagnosis not present

## 2020-05-03 MED ORDER — INSULIN GLARGINE 100 UNIT/ML ~~LOC~~ SOLN: SUBCUTANEOUS | 1 refills | Status: DC

## 2020-05-03 NOTE — Telephone Encounter (Signed)
Patient's daughter called checking in on the refill for her mother's prescription - patient is out of Insulin. Patient's daughter states she's been attempting to retreive this since last Friday, she's unsure if the request from Suzie Portela is not coming through or what. Please advise.

## 2020-05-03 NOTE — Telephone Encounter (Signed)
Please advise 

## 2020-05-03 NOTE — Telephone Encounter (Signed)
Please advise... last refill by different provider

## 2020-05-03 NOTE — Telephone Encounter (Signed)
Ok, but please first verify dosage with pt.  TY

## 2020-05-03 NOTE — Telephone Encounter (Signed)
MEDICATION: Lantus  PHARMACY:  [facility] [place] CONTACTED [place]?  yes  IS THIS A 90 DAY SUPPLY :   IS PATIENT OUT OF MEDICATION: yes  IF NOT; HOW MUCH IS LEFT:   LAST APPOINTMENT DATE: @3 /03/2020  NEXT APPOINTMENT DATE:@4 /20/2022  DO WE HAVE YOUR PERMISSION TO LEAVE A DETAILED MESSAGE?: yes  OTHER COMMENTS:    **Let patient know to contact pharmacy at the end of the day to make sure medication is ready. **  ** Please notify patient to allow 48-72 hours to process**  **Encourage patient to contact the pharmacy for refills or they can request refills through [facility]**

## 2020-05-03 NOTE — Telephone Encounter (Signed)
Sent Rx lantus 16 units daily--[facility]

## 2020-05-05 DIAGNOSIS — Z992 Dependence on renal dialysis: Secondary | ICD-10-CM | POA: Diagnosis not present

## 2020-05-05 DIAGNOSIS — D631 Anemia in chronic kidney disease: Secondary | ICD-10-CM | POA: Diagnosis not present

## 2020-05-05 DIAGNOSIS — D509 Iron deficiency anemia, unspecified: Secondary | ICD-10-CM | POA: Diagnosis not present

## 2020-05-05 DIAGNOSIS — N2581 Secondary hyperparathyroidism of renal origin: Secondary | ICD-10-CM | POA: Diagnosis not present

## 2020-05-05 DIAGNOSIS — E1129 Type 2 diabetes mellitus with other diabetic kidney complication: Secondary | ICD-10-CM | POA: Diagnosis not present

## 2020-05-05 DIAGNOSIS — N186 End stage renal disease: Secondary | ICD-10-CM | POA: Diagnosis not present

## 2020-05-07 DIAGNOSIS — Z992 Dependence on renal dialysis: Secondary | ICD-10-CM | POA: Diagnosis not present

## 2020-05-07 DIAGNOSIS — N2581 Secondary hyperparathyroidism of renal origin: Secondary | ICD-10-CM | POA: Diagnosis not present

## 2020-05-07 DIAGNOSIS — N186 End stage renal disease: Secondary | ICD-10-CM | POA: Diagnosis not present

## 2020-05-07 DIAGNOSIS — D509 Iron deficiency anemia, unspecified: Secondary | ICD-10-CM | POA: Diagnosis not present

## 2020-05-07 DIAGNOSIS — E1129 Type 2 diabetes mellitus with other diabetic kidney complication: Secondary | ICD-10-CM | POA: Diagnosis not present

## 2020-05-07 DIAGNOSIS — D631 Anemia in chronic kidney disease: Secondary | ICD-10-CM | POA: Diagnosis not present

## 2020-05-10 DIAGNOSIS — Z992 Dependence on renal dialysis: Secondary | ICD-10-CM | POA: Diagnosis not present

## 2020-05-10 DIAGNOSIS — E1129 Type 2 diabetes mellitus with other diabetic kidney complication: Secondary | ICD-10-CM | POA: Diagnosis not present

## 2020-05-10 DIAGNOSIS — D631 Anemia in chronic kidney disease: Secondary | ICD-10-CM | POA: Diagnosis not present

## 2020-05-10 DIAGNOSIS — N186 End stage renal disease: Secondary | ICD-10-CM | POA: Diagnosis not present

## 2020-05-10 DIAGNOSIS — N2581 Secondary hyperparathyroidism of renal origin: Secondary | ICD-10-CM | POA: Diagnosis not present

## 2020-05-10 DIAGNOSIS — D509 Iron deficiency anemia, unspecified: Secondary | ICD-10-CM | POA: Diagnosis not present

## 2020-05-12 DIAGNOSIS — E1129 Type 2 diabetes mellitus with other diabetic kidney complication: Secondary | ICD-10-CM | POA: Diagnosis not present

## 2020-05-12 DIAGNOSIS — N186 End stage renal disease: Secondary | ICD-10-CM | POA: Diagnosis not present

## 2020-05-12 DIAGNOSIS — Z992 Dependence on renal dialysis: Secondary | ICD-10-CM | POA: Diagnosis not present

## 2020-05-12 DIAGNOSIS — N2581 Secondary hyperparathyroidism of renal origin: Secondary | ICD-10-CM | POA: Diagnosis not present

## 2020-05-12 DIAGNOSIS — D509 Iron deficiency anemia, unspecified: Secondary | ICD-10-CM | POA: Diagnosis not present

## 2020-05-12 DIAGNOSIS — D631 Anemia in chronic kidney disease: Secondary | ICD-10-CM | POA: Diagnosis not present

## 2020-05-14 DIAGNOSIS — N2581 Secondary hyperparathyroidism of renal origin: Secondary | ICD-10-CM | POA: Diagnosis not present

## 2020-05-14 DIAGNOSIS — N186 End stage renal disease: Secondary | ICD-10-CM | POA: Diagnosis not present

## 2020-05-14 DIAGNOSIS — D631 Anemia in chronic kidney disease: Secondary | ICD-10-CM | POA: Diagnosis not present

## 2020-05-14 DIAGNOSIS — Z992 Dependence on renal dialysis: Secondary | ICD-10-CM | POA: Diagnosis not present

## 2020-05-14 DIAGNOSIS — E1129 Type 2 diabetes mellitus with other diabetic kidney complication: Secondary | ICD-10-CM | POA: Diagnosis not present

## 2020-05-14 DIAGNOSIS — D509 Iron deficiency anemia, unspecified: Secondary | ICD-10-CM | POA: Diagnosis not present

## 2020-05-17 DIAGNOSIS — Z992 Dependence on renal dialysis: Secondary | ICD-10-CM | POA: Diagnosis not present

## 2020-05-17 DIAGNOSIS — E1129 Type 2 diabetes mellitus with other diabetic kidney complication: Secondary | ICD-10-CM | POA: Diagnosis not present

## 2020-05-17 DIAGNOSIS — D631 Anemia in chronic kidney disease: Secondary | ICD-10-CM | POA: Diagnosis not present

## 2020-05-17 DIAGNOSIS — N186 End stage renal disease: Secondary | ICD-10-CM | POA: Diagnosis not present

## 2020-05-17 DIAGNOSIS — N2581 Secondary hyperparathyroidism of renal origin: Secondary | ICD-10-CM | POA: Diagnosis not present

## 2020-05-17 DIAGNOSIS — D509 Iron deficiency anemia, unspecified: Secondary | ICD-10-CM | POA: Diagnosis not present

## 2020-05-18 ENCOUNTER — Other Ambulatory Visit (INDEPENDENT_AMBULATORY_CARE_PROVIDER_SITE_OTHER): Payer: Medicare Other

## 2020-05-18 ENCOUNTER — Other Ambulatory Visit: Payer: Self-pay

## 2020-05-18 DIAGNOSIS — Z794 Long term (current) use of insulin: Secondary | ICD-10-CM

## 2020-05-18 DIAGNOSIS — E1165 Type 2 diabetes mellitus with hyperglycemia: Secondary | ICD-10-CM | POA: Diagnosis not present

## 2020-05-18 LAB — GLUCOSE, RANDOM: Glucose, Bld: 99 mg/dL (ref 70–99)

## 2020-05-19 DIAGNOSIS — D631 Anemia in chronic kidney disease: Secondary | ICD-10-CM | POA: Diagnosis not present

## 2020-05-19 DIAGNOSIS — D509 Iron deficiency anemia, unspecified: Secondary | ICD-10-CM | POA: Diagnosis not present

## 2020-05-19 DIAGNOSIS — E1129 Type 2 diabetes mellitus with other diabetic kidney complication: Secondary | ICD-10-CM | POA: Diagnosis not present

## 2020-05-19 DIAGNOSIS — N186 End stage renal disease: Secondary | ICD-10-CM | POA: Diagnosis not present

## 2020-05-19 DIAGNOSIS — Z992 Dependence on renal dialysis: Secondary | ICD-10-CM | POA: Diagnosis not present

## 2020-05-19 DIAGNOSIS — N2581 Secondary hyperparathyroidism of renal origin: Secondary | ICD-10-CM | POA: Diagnosis not present

## 2020-05-19 LAB — FRUCTOSAMINE: Fructosamine: 275 umol/L (ref 0–285)

## 2020-05-21 DIAGNOSIS — D509 Iron deficiency anemia, unspecified: Secondary | ICD-10-CM | POA: Diagnosis not present

## 2020-05-21 DIAGNOSIS — Z992 Dependence on renal dialysis: Secondary | ICD-10-CM | POA: Diagnosis not present

## 2020-05-21 DIAGNOSIS — N186 End stage renal disease: Secondary | ICD-10-CM | POA: Diagnosis not present

## 2020-05-21 DIAGNOSIS — D631 Anemia in chronic kidney disease: Secondary | ICD-10-CM | POA: Diagnosis not present

## 2020-05-21 DIAGNOSIS — E1129 Type 2 diabetes mellitus with other diabetic kidney complication: Secondary | ICD-10-CM | POA: Diagnosis not present

## 2020-05-21 DIAGNOSIS — N2581 Secondary hyperparathyroidism of renal origin: Secondary | ICD-10-CM | POA: Diagnosis not present

## 2020-05-23 ENCOUNTER — Encounter: Payer: Self-pay | Admitting: Endocrinology

## 2020-05-23 ENCOUNTER — Other Ambulatory Visit: Payer: Self-pay

## 2020-05-23 ENCOUNTER — Telehealth: Payer: Self-pay | Admitting: Endocrinology

## 2020-05-23 ENCOUNTER — Encounter: Payer: Medicare Other | Admitting: Endocrinology

## 2020-05-23 NOTE — Progress Notes (Signed)
This encounter was created in error - please disregard.

## 2020-05-23 NOTE — Telephone Encounter (Signed)
Have not seen any form but will be on the look out.

## 2020-05-23 NOTE — Telephone Encounter (Signed)
Pt Daughter calling regarding Blood Glucose Monitoring Suppl (FREESTYLE FREEDOM) KIT Pt is needing Provider to fill out the fax that is coming  Regarding to get the freestyle filled.  Company is going to fax information as well as call us. Voiced she did not know if it was the manufacture or the pharmacy faxing information over.    [place] ([place]), [place] - [address]

## 2020-05-24 DIAGNOSIS — N2581 Secondary hyperparathyroidism of renal origin: Secondary | ICD-10-CM | POA: Diagnosis not present

## 2020-05-24 DIAGNOSIS — Z992 Dependence on renal dialysis: Secondary | ICD-10-CM | POA: Diagnosis not present

## 2020-05-24 DIAGNOSIS — E1129 Type 2 diabetes mellitus with other diabetic kidney complication: Secondary | ICD-10-CM | POA: Diagnosis not present

## 2020-05-24 DIAGNOSIS — D631 Anemia in chronic kidney disease: Secondary | ICD-10-CM | POA: Diagnosis not present

## 2020-05-24 DIAGNOSIS — N186 End stage renal disease: Secondary | ICD-10-CM | POA: Diagnosis not present

## 2020-05-24 DIAGNOSIS — D509 Iron deficiency anemia, unspecified: Secondary | ICD-10-CM | POA: Diagnosis not present

## 2020-05-26 DIAGNOSIS — Z992 Dependence on renal dialysis: Secondary | ICD-10-CM | POA: Diagnosis not present

## 2020-05-26 DIAGNOSIS — N186 End stage renal disease: Secondary | ICD-10-CM | POA: Diagnosis not present

## 2020-05-26 DIAGNOSIS — D509 Iron deficiency anemia, unspecified: Secondary | ICD-10-CM | POA: Diagnosis not present

## 2020-05-26 DIAGNOSIS — N2581 Secondary hyperparathyroidism of renal origin: Secondary | ICD-10-CM | POA: Diagnosis not present

## 2020-05-26 DIAGNOSIS — D631 Anemia in chronic kidney disease: Secondary | ICD-10-CM | POA: Diagnosis not present

## 2020-05-26 DIAGNOSIS — E1129 Type 2 diabetes mellitus with other diabetic kidney complication: Secondary | ICD-10-CM | POA: Diagnosis not present

## 2020-05-28 DIAGNOSIS — D509 Iron deficiency anemia, unspecified: Secondary | ICD-10-CM | POA: Diagnosis not present

## 2020-05-28 DIAGNOSIS — N186 End stage renal disease: Secondary | ICD-10-CM | POA: Diagnosis not present

## 2020-05-28 DIAGNOSIS — D631 Anemia in chronic kidney disease: Secondary | ICD-10-CM | POA: Diagnosis not present

## 2020-05-28 DIAGNOSIS — Z992 Dependence on renal dialysis: Secondary | ICD-10-CM | POA: Diagnosis not present

## 2020-05-28 DIAGNOSIS — E1129 Type 2 diabetes mellitus with other diabetic kidney complication: Secondary | ICD-10-CM | POA: Diagnosis not present

## 2020-05-28 DIAGNOSIS — N2581 Secondary hyperparathyroidism of renal origin: Secondary | ICD-10-CM | POA: Diagnosis not present

## 2020-05-29 DIAGNOSIS — N186 End stage renal disease: Secondary | ICD-10-CM | POA: Diagnosis not present

## 2020-05-29 DIAGNOSIS — E1122 Type 2 diabetes mellitus with diabetic chronic kidney disease: Secondary | ICD-10-CM | POA: Diagnosis not present

## 2020-05-29 DIAGNOSIS — Z992 Dependence on renal dialysis: Secondary | ICD-10-CM | POA: Diagnosis not present

## 2020-05-31 DIAGNOSIS — Z992 Dependence on renal dialysis: Secondary | ICD-10-CM | POA: Diagnosis not present

## 2020-05-31 DIAGNOSIS — D509 Iron deficiency anemia, unspecified: Secondary | ICD-10-CM | POA: Diagnosis not present

## 2020-05-31 DIAGNOSIS — N2581 Secondary hyperparathyroidism of renal origin: Secondary | ICD-10-CM | POA: Diagnosis not present

## 2020-05-31 DIAGNOSIS — E1129 Type 2 diabetes mellitus with other diabetic kidney complication: Secondary | ICD-10-CM | POA: Diagnosis not present

## 2020-05-31 DIAGNOSIS — N186 End stage renal disease: Secondary | ICD-10-CM | POA: Diagnosis not present

## 2020-05-31 DIAGNOSIS — D631 Anemia in chronic kidney disease: Secondary | ICD-10-CM | POA: Diagnosis not present

## 2020-06-02 DIAGNOSIS — E1129 Type 2 diabetes mellitus with other diabetic kidney complication: Secondary | ICD-10-CM | POA: Diagnosis not present

## 2020-06-02 DIAGNOSIS — N2581 Secondary hyperparathyroidism of renal origin: Secondary | ICD-10-CM | POA: Diagnosis not present

## 2020-06-02 DIAGNOSIS — Z992 Dependence on renal dialysis: Secondary | ICD-10-CM | POA: Diagnosis not present

## 2020-06-02 DIAGNOSIS — D631 Anemia in chronic kidney disease: Secondary | ICD-10-CM | POA: Diagnosis not present

## 2020-06-02 DIAGNOSIS — D509 Iron deficiency anemia, unspecified: Secondary | ICD-10-CM | POA: Diagnosis not present

## 2020-06-02 DIAGNOSIS — N186 End stage renal disease: Secondary | ICD-10-CM | POA: Diagnosis not present

## 2020-06-03 ENCOUNTER — Other Ambulatory Visit: Payer: Self-pay

## 2020-06-03 ENCOUNTER — Telehealth: Payer: Self-pay | Admitting: Neurology

## 2020-06-03 MED ORDER — LAMOTRIGINE 25 MG PO TABS: ORAL_TABLET | ORAL | 0 refills | Status: DC

## 2020-06-03 NOTE — Telephone Encounter (Signed)
Refill sent.

## 2020-06-03 NOTE — Telephone Encounter (Signed)
New message   Patient is running out of medication due to new direction - taken her last two pill this morning.   1. Which medications need to be refilled? (please list name of each medication and dose if known) lamoTRIgine (LAMICTAL) 25 MG tablet  2. Which pharmacy/location (including street and city if local pharmacy) is medication to be sent to?[place] ([place]), [place] - [address]  3. Do they need a 30 day or 90 day supply? 90 day supply

## 2020-06-04 DIAGNOSIS — N2581 Secondary hyperparathyroidism of renal origin: Secondary | ICD-10-CM | POA: Diagnosis not present

## 2020-06-04 DIAGNOSIS — E1129 Type 2 diabetes mellitus with other diabetic kidney complication: Secondary | ICD-10-CM | POA: Diagnosis not present

## 2020-06-04 DIAGNOSIS — D631 Anemia in chronic kidney disease: Secondary | ICD-10-CM | POA: Diagnosis not present

## 2020-06-04 DIAGNOSIS — N186 End stage renal disease: Secondary | ICD-10-CM | POA: Diagnosis not present

## 2020-06-04 DIAGNOSIS — D509 Iron deficiency anemia, unspecified: Secondary | ICD-10-CM | POA: Diagnosis not present

## 2020-06-04 DIAGNOSIS — Z992 Dependence on renal dialysis: Secondary | ICD-10-CM | POA: Diagnosis not present

## 2020-06-07 DIAGNOSIS — N186 End stage renal disease: Secondary | ICD-10-CM | POA: Diagnosis not present

## 2020-06-07 DIAGNOSIS — N2581 Secondary hyperparathyroidism of renal origin: Secondary | ICD-10-CM | POA: Diagnosis not present

## 2020-06-07 DIAGNOSIS — D631 Anemia in chronic kidney disease: Secondary | ICD-10-CM | POA: Diagnosis not present

## 2020-06-07 DIAGNOSIS — D509 Iron deficiency anemia, unspecified: Secondary | ICD-10-CM | POA: Diagnosis not present

## 2020-06-07 DIAGNOSIS — Z992 Dependence on renal dialysis: Secondary | ICD-10-CM | POA: Diagnosis not present

## 2020-06-07 DIAGNOSIS — E1129 Type 2 diabetes mellitus with other diabetic kidney complication: Secondary | ICD-10-CM | POA: Diagnosis not present

## 2020-06-08 ENCOUNTER — Ambulatory Visit: Payer: Medicare Other | Admitting: Podiatry

## 2020-06-09 DIAGNOSIS — D631 Anemia in chronic kidney disease: Secondary | ICD-10-CM | POA: Diagnosis not present

## 2020-06-09 DIAGNOSIS — N186 End stage renal disease: Secondary | ICD-10-CM | POA: Diagnosis not present

## 2020-06-09 DIAGNOSIS — N2581 Secondary hyperparathyroidism of renal origin: Secondary | ICD-10-CM | POA: Diagnosis not present

## 2020-06-09 DIAGNOSIS — D509 Iron deficiency anemia, unspecified: Secondary | ICD-10-CM | POA: Diagnosis not present

## 2020-06-09 DIAGNOSIS — Z992 Dependence on renal dialysis: Secondary | ICD-10-CM | POA: Diagnosis not present

## 2020-06-09 DIAGNOSIS — E1129 Type 2 diabetes mellitus with other diabetic kidney complication: Secondary | ICD-10-CM | POA: Diagnosis not present

## 2020-06-11 DIAGNOSIS — E1129 Type 2 diabetes mellitus with other diabetic kidney complication: Secondary | ICD-10-CM | POA: Diagnosis not present

## 2020-06-11 DIAGNOSIS — D631 Anemia in chronic kidney disease: Secondary | ICD-10-CM | POA: Diagnosis not present

## 2020-06-11 DIAGNOSIS — N186 End stage renal disease: Secondary | ICD-10-CM | POA: Diagnosis not present

## 2020-06-11 DIAGNOSIS — Z992 Dependence on renal dialysis: Secondary | ICD-10-CM | POA: Diagnosis not present

## 2020-06-11 DIAGNOSIS — N2581 Secondary hyperparathyroidism of renal origin: Secondary | ICD-10-CM | POA: Diagnosis not present

## 2020-06-11 DIAGNOSIS — D509 Iron deficiency anemia, unspecified: Secondary | ICD-10-CM | POA: Diagnosis not present

## 2020-06-14 DIAGNOSIS — D631 Anemia in chronic kidney disease: Secondary | ICD-10-CM | POA: Diagnosis not present

## 2020-06-14 DIAGNOSIS — N186 End stage renal disease: Secondary | ICD-10-CM | POA: Diagnosis not present

## 2020-06-14 DIAGNOSIS — N2581 Secondary hyperparathyroidism of renal origin: Secondary | ICD-10-CM | POA: Diagnosis not present

## 2020-06-14 DIAGNOSIS — E1129 Type 2 diabetes mellitus with other diabetic kidney complication: Secondary | ICD-10-CM | POA: Diagnosis not present

## 2020-06-14 DIAGNOSIS — Z992 Dependence on renal dialysis: Secondary | ICD-10-CM | POA: Diagnosis not present

## 2020-06-14 DIAGNOSIS — D509 Iron deficiency anemia, unspecified: Secondary | ICD-10-CM | POA: Diagnosis not present

## 2020-06-16 DIAGNOSIS — Z992 Dependence on renal dialysis: Secondary | ICD-10-CM | POA: Diagnosis not present

## 2020-06-16 DIAGNOSIS — D631 Anemia in chronic kidney disease: Secondary | ICD-10-CM | POA: Diagnosis not present

## 2020-06-16 DIAGNOSIS — N2581 Secondary hyperparathyroidism of renal origin: Secondary | ICD-10-CM | POA: Diagnosis not present

## 2020-06-16 DIAGNOSIS — E1129 Type 2 diabetes mellitus with other diabetic kidney complication: Secondary | ICD-10-CM | POA: Diagnosis not present

## 2020-06-16 DIAGNOSIS — N186 End stage renal disease: Secondary | ICD-10-CM | POA: Diagnosis not present

## 2020-06-16 DIAGNOSIS — D509 Iron deficiency anemia, unspecified: Secondary | ICD-10-CM | POA: Diagnosis not present

## 2020-06-16 NOTE — Telephone Encounter (Signed)
Patient's daughter called stating [place] has sent [place] a fax for additional information they need for Medicare. [name] (daughter) states she just spoke with them and they are faxing it again and asked if we could be on the lookout. She asked if we would be able to call them and see that it gets done. Ph# [phone]

## 2020-06-18 DIAGNOSIS — D509 Iron deficiency anemia, unspecified: Secondary | ICD-10-CM | POA: Diagnosis not present

## 2020-06-18 DIAGNOSIS — E1129 Type 2 diabetes mellitus with other diabetic kidney complication: Secondary | ICD-10-CM | POA: Diagnosis not present

## 2020-06-18 DIAGNOSIS — N2581 Secondary hyperparathyroidism of renal origin: Secondary | ICD-10-CM | POA: Diagnosis not present

## 2020-06-18 DIAGNOSIS — N186 End stage renal disease: Secondary | ICD-10-CM | POA: Diagnosis not present

## 2020-06-18 DIAGNOSIS — D631 Anemia in chronic kidney disease: Secondary | ICD-10-CM | POA: Diagnosis not present

## 2020-06-18 DIAGNOSIS — Z992 Dependence on renal dialysis: Secondary | ICD-10-CM | POA: Diagnosis not present

## 2020-06-20 ENCOUNTER — Other Ambulatory Visit: Payer: Medicare Other

## 2020-06-21 DIAGNOSIS — N186 End stage renal disease: Secondary | ICD-10-CM | POA: Diagnosis not present

## 2020-06-21 DIAGNOSIS — Z992 Dependence on renal dialysis: Secondary | ICD-10-CM | POA: Diagnosis not present

## 2020-06-21 DIAGNOSIS — N2581 Secondary hyperparathyroidism of renal origin: Secondary | ICD-10-CM | POA: Diagnosis not present

## 2020-06-21 DIAGNOSIS — E1129 Type 2 diabetes mellitus with other diabetic kidney complication: Secondary | ICD-10-CM | POA: Diagnosis not present

## 2020-06-21 DIAGNOSIS — D631 Anemia in chronic kidney disease: Secondary | ICD-10-CM | POA: Diagnosis not present

## 2020-06-21 DIAGNOSIS — D509 Iron deficiency anemia, unspecified: Secondary | ICD-10-CM | POA: Diagnosis not present

## 2020-06-23 DIAGNOSIS — Z992 Dependence on renal dialysis: Secondary | ICD-10-CM | POA: Diagnosis not present

## 2020-06-23 DIAGNOSIS — D509 Iron deficiency anemia, unspecified: Secondary | ICD-10-CM | POA: Diagnosis not present

## 2020-06-23 DIAGNOSIS — N186 End stage renal disease: Secondary | ICD-10-CM | POA: Diagnosis not present

## 2020-06-23 DIAGNOSIS — E1129 Type 2 diabetes mellitus with other diabetic kidney complication: Secondary | ICD-10-CM | POA: Diagnosis not present

## 2020-06-23 DIAGNOSIS — N2581 Secondary hyperparathyroidism of renal origin: Secondary | ICD-10-CM | POA: Diagnosis not present

## 2020-06-23 DIAGNOSIS — D631 Anemia in chronic kidney disease: Secondary | ICD-10-CM | POA: Diagnosis not present

## 2020-06-24 ENCOUNTER — Ambulatory Visit (INDEPENDENT_AMBULATORY_CARE_PROVIDER_SITE_OTHER): Payer: Medicare Other | Admitting: Podiatry

## 2020-06-24 ENCOUNTER — Ambulatory Visit: Payer: Medicare Other | Admitting: Endocrinology

## 2020-06-24 ENCOUNTER — Other Ambulatory Visit: Payer: Self-pay

## 2020-06-24 DIAGNOSIS — L603 Nail dystrophy: Secondary | ICD-10-CM

## 2020-06-24 DIAGNOSIS — L600 Ingrowing nail: Secondary | ICD-10-CM

## 2020-06-25 DIAGNOSIS — N186 End stage renal disease: Secondary | ICD-10-CM | POA: Diagnosis not present

## 2020-06-25 DIAGNOSIS — Z992 Dependence on renal dialysis: Secondary | ICD-10-CM | POA: Diagnosis not present

## 2020-06-25 DIAGNOSIS — E1129 Type 2 diabetes mellitus with other diabetic kidney complication: Secondary | ICD-10-CM | POA: Diagnosis not present

## 2020-06-25 DIAGNOSIS — N2581 Secondary hyperparathyroidism of renal origin: Secondary | ICD-10-CM | POA: Diagnosis not present

## 2020-06-25 DIAGNOSIS — D509 Iron deficiency anemia, unspecified: Secondary | ICD-10-CM | POA: Diagnosis not present

## 2020-06-25 DIAGNOSIS — D631 Anemia in chronic kidney disease: Secondary | ICD-10-CM | POA: Diagnosis not present

## 2020-06-28 DIAGNOSIS — N2581 Secondary hyperparathyroidism of renal origin: Secondary | ICD-10-CM | POA: Diagnosis not present

## 2020-06-28 DIAGNOSIS — Z992 Dependence on renal dialysis: Secondary | ICD-10-CM | POA: Diagnosis not present

## 2020-06-28 DIAGNOSIS — D509 Iron deficiency anemia, unspecified: Secondary | ICD-10-CM | POA: Diagnosis not present

## 2020-06-28 DIAGNOSIS — N186 End stage renal disease: Secondary | ICD-10-CM | POA: Diagnosis not present

## 2020-06-28 DIAGNOSIS — E1129 Type 2 diabetes mellitus with other diabetic kidney complication: Secondary | ICD-10-CM | POA: Diagnosis not present

## 2020-06-28 DIAGNOSIS — D631 Anemia in chronic kidney disease: Secondary | ICD-10-CM | POA: Diagnosis not present

## 2020-06-28 NOTE — Telephone Encounter (Signed)
Julie Brewer with [facility] ph# [phone] called again re: [place] still requires most recent medical notes that include frequent self adjustments/sliding scale and for current medications they need for form to say either reviewed or reconciled.  Julie Brewer states that the above request has been sent on May 7,2022  Jun 16, 2020 and today (06/28/20).

## 2020-06-29 ENCOUNTER — Encounter: Payer: Self-pay | Admitting: Podiatry

## 2020-06-29 DIAGNOSIS — E1122 Type 2 diabetes mellitus with diabetic chronic kidney disease: Secondary | ICD-10-CM | POA: Diagnosis not present

## 2020-06-29 DIAGNOSIS — Z992 Dependence on renal dialysis: Secondary | ICD-10-CM | POA: Diagnosis not present

## 2020-06-29 DIAGNOSIS — N186 End stage renal disease: Secondary | ICD-10-CM | POA: Diagnosis not present

## 2020-06-29 NOTE — Progress Notes (Signed)
Subjective:  Patient ID: Julie Brewer, female    DOB: April 01, 1950,  MRN: [id number]  Chief Complaint  Patient presents with  . Nail Problem    Right hallux nail causing discomfort     70 y.o. female presents with the above complaint.  Patient presents with complaint of right medial border ingrown of the hallux.  Patient states is painful to touch.  She states has been causing her discomfort.  There is dystrophic nature to the nail leading to the ingrown.  She states that it is hard with ambulation.  She would like to have it removed.  She had an ingrown done on the other side long time ago.  She states that that is doing much better.  She is a diabetic with last A1c of 7.7   Review of Systems: Negative except as noted in the HPI. Denies N/V/F/Ch.  Past Medical History:  Diagnosis Date  . Asthma    No probnlems recently  . 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 CHF (congestive heart failure) ([place])   . Chronic kidney disease    Tu/Th/Sa  . Diabetes mellitus    Type II per Dr [name]  (patient said type I)  . Fibroid   . GERD (gastroesophageal reflux disease)   . Glaucoma   . Hyperlipidemia   . Hypertension   . Iron deficiency anemia 03/09/2016  . Peripheral vascular disease ([place])   . Pneumonia 2006  . PONV (postoperative nausea and vomiting)   . Shortness of breath dyspnea    with exdrtion, "Walkling too fast"  . Stroke [facility])    no residual    Current Outpatient Medications:  .  acetaminophen (TYLENOL) 325 MG tablet, Take 2 tablets (650 mg total) by mouth every 6 (six) hours as needed for mild pain (or Fever >/= 101)., Disp:  , Rfl:  .  amLODipine (NORVASC) 10 MG tablet, SMARTSIG:.5 Tablet(s) By Mouth Daily, Disp: , Rfl:  .  aspirin 325 MG EC tablet, Take 325 mg by mouth daily. , Disp: , Rfl:  .  atorvastatin (LIPITOR) 20 MG tablet, Take 1 tablet by mouth once daily, Disp: 90 tablet, Rfl: 3 .  bisacodyl (DULCOLAX) 5 MG  EC tablet, Take 5 mg by mouth daily as needed for moderate constipation., Disp: , Rfl:  .  Blood Glucose Monitoring Suppl (FREESTYLE FREEDOM) KIT, Use to check blood sugar once a day dx code, Disp: 1 kit, Rfl: 1 .  cilostazol (PLETAL) 100 MG tablet, Take 1 tablet by mouth twice daily, Disp: 180 tablet, Rfl: 0 .  docusate sodium (COLACE) 100 MG capsule, Take 200 mg by mouth daily. , Disp: , Rfl:  .  ferrous sulfate 325 (65 FE) MG tablet, Take 650 mg by mouth daily., Disp: , Rfl:  .  gabapentin (NEURONTIN) 100 MG capsule, TAKE 1 CAPSULE BY MOUTH TWICE DAILY AS NEEDED, Disp: 180 capsule, Rfl: 1 .  glucose blood (FREESTYLE LITE) test strip, USE AS INSTRUCTED TO CHECK BLOOD SUGAR ONCE DAILY., Disp: 50 each, Rfl: 3 .  insulin glargine (LANTUS) 100 UNIT/ML injection, Lantus insulin 16 U in the morning daily., Disp: 15 mL, Rfl: 1 .  Insulin Pen Needle 31G X 5 MM MISC, Use with pen, Disp: 100 each, Rfl: 1 .  insulin regular (NOVOLIN R) 100 units/mL injection, Inject into the skin 2 (two) times daily before a meal. Inject 10 units under the skin before breakfast and 5-7  units before dinner., Disp: , Rfl:  .  lamoTRIgine (LAMICTAL) 25 MG tablet, Take 1 tablet twice a day for 2 weeks, then increase to 2 tablets twice a day, Disp: 120 tablet, Rfl: 0 .  lidocaine-prilocaine (EMLA) cream, SMARTSIG:Sparingly Topical, Disp: , Rfl:  .  Methoxy PEG-Epoetin Beta (MIRCERA IJ), , Disp: , Rfl:  .  omeprazole (PRILOSEC) 20 MG capsule, TAKE 1 CAPSULE BY MOUTH ONCE DAILY, Disp: 90 capsule, Rfl: 0 .  Vitamin D, Ergocalciferol, (DRISDOL) 1.25 MG (50000 UNIT) CAPS capsule, Take 1 capsule by mouth once a week, Disp: 12 capsule, Rfl: 0  Social History   Tobacco Use  Smoking Status Current Every Day Smoker  . Packs/day: 1.00  . Years: 30.00  . Pack years: 30.00  . Types: Cigarettes  Smokeless Tobacco Never Used    Allergies  Allergen Reactions  . Morphine And Related Other (See Comments)    Hallucinations   .  Penicillins Swelling and Rash    Throat swelling Did it involve swelling of the face/tongue/throat, SOB, or low BP? Yes Did it involve sudden or severe rash/hives, skin peeling, or any reaction on the inside of your mouth or nose? Yes Did you need to seek medical attention at a hospital or doctor's office? Yes When did it last happen?young child If all above answers are "NO", may proceed with cephalosporin use.   Objective:  There were no vitals filed for this visit. There is no height or weight on file to calculate BMI. Constitutional Well developed. Well nourished.  Vascular Dorsalis pedis pulses palpable bilaterally. Posterior tibial pulses palpable bilaterally. Capillary refill normal to all digits.  No cyanosis or clubbing noted. Pedal hair growth normal.  Neurologic Normal speech. Oriented to person, place, and time. Epicritic sensation to light touch grossly present bilaterally.  Dermatologic Painful ingrowing nail at medial nail borders of the hallux nail right. No other open wounds. No skin lesions.  Orthopedic: Normal joint ROM without pain or crepitus bilaterally. No visible deformities. No bony tenderness.   Radiographs: None Assessment:   1. Ingrown toenail of right foot   2. Nail dystrophy    Plan:  Patient was evaluated and treated and all questions answered.  Ingrown Nail, right with underlying nail dystrophy -Patient elects to proceed with minor surgery to remove ingrown toenail removal today. Consent reviewed and signed by patient. -Ingrown nail excised. See procedure note. -Educated on post-procedure care including soaking. Written instructions provided and reviewed. -Patient to follow up in 2 weeks for nail check. -I discussed with her that given that she is diabetic she is at high risk of developing wound complication.  Her A1c is uncontrolled at 7.7.  However she is still below the cutoff of 8% but she is still low risk of developing wound  complication.  She states that she understands and is willing to proceed despite the risks  Procedure: Excision of Ingrown Toenail Location: Right 1st toe medial nail borders. Anesthesia: Lidocaine 1% plain; 1.5 mL and Marcaine 0.5% plain; 1.5 mL, digital block. Skin Prep: Betadine. Dressing: Silvadene; telfa; dry, sterile, compression dressing. Technique: Following skin prep, the toe was exsanguinated and a tourniquet was secured at the base of the toe. The affected nail border was freed, split with a nail splitter, and excised. Chemical matrixectomy was then performed with phenol and irrigated out with alcohol. The tourniquet was then removed and sterile dressing applied. Disposition: Patient tolerated procedure well. Patient to return in 2 weeks for follow-up.   No follow-ups  on file.

## 2020-06-30 ENCOUNTER — Other Ambulatory Visit: Payer: Self-pay | Admitting: Vascular Surgery

## 2020-06-30 DIAGNOSIS — N2581 Secondary hyperparathyroidism of renal origin: Secondary | ICD-10-CM | POA: Diagnosis not present

## 2020-06-30 DIAGNOSIS — E1129 Type 2 diabetes mellitus with other diabetic kidney complication: Secondary | ICD-10-CM | POA: Diagnosis not present

## 2020-06-30 DIAGNOSIS — N186 End stage renal disease: Secondary | ICD-10-CM | POA: Diagnosis not present

## 2020-06-30 DIAGNOSIS — Z992 Dependence on renal dialysis: Secondary | ICD-10-CM | POA: Diagnosis not present

## 2020-06-30 DIAGNOSIS — D631 Anemia in chronic kidney disease: Secondary | ICD-10-CM | POA: Diagnosis not present

## 2020-07-02 ENCOUNTER — Other Ambulatory Visit: Payer: Self-pay | Admitting: Endocrinology

## 2020-07-02 DIAGNOSIS — N186 End stage renal disease: Secondary | ICD-10-CM | POA: Diagnosis not present

## 2020-07-02 DIAGNOSIS — N2581 Secondary hyperparathyroidism of renal origin: Secondary | ICD-10-CM | POA: Diagnosis not present

## 2020-07-02 DIAGNOSIS — Z992 Dependence on renal dialysis: Secondary | ICD-10-CM | POA: Diagnosis not present

## 2020-07-02 DIAGNOSIS — E1129 Type 2 diabetes mellitus with other diabetic kidney complication: Secondary | ICD-10-CM | POA: Diagnosis not present

## 2020-07-02 DIAGNOSIS — D631 Anemia in chronic kidney disease: Secondary | ICD-10-CM | POA: Diagnosis not present

## 2020-07-05 DIAGNOSIS — D631 Anemia in chronic kidney disease: Secondary | ICD-10-CM | POA: Diagnosis not present

## 2020-07-05 DIAGNOSIS — E1129 Type 2 diabetes mellitus with other diabetic kidney complication: Secondary | ICD-10-CM | POA: Diagnosis not present

## 2020-07-05 DIAGNOSIS — N2581 Secondary hyperparathyroidism of renal origin: Secondary | ICD-10-CM | POA: Diagnosis not present

## 2020-07-05 DIAGNOSIS — Z992 Dependence on renal dialysis: Secondary | ICD-10-CM | POA: Diagnosis not present

## 2020-07-05 DIAGNOSIS — N186 End stage renal disease: Secondary | ICD-10-CM | POA: Diagnosis not present

## 2020-07-06 ENCOUNTER — Other Ambulatory Visit: Payer: Self-pay | Admitting: Neurology

## 2020-07-06 ENCOUNTER — Ambulatory Visit: Payer: Medicare Other | Admitting: Podiatry

## 2020-07-09 DIAGNOSIS — Z992 Dependence on renal dialysis: Secondary | ICD-10-CM | POA: Diagnosis not present

## 2020-07-09 DIAGNOSIS — N2581 Secondary hyperparathyroidism of renal origin: Secondary | ICD-10-CM | POA: Diagnosis not present

## 2020-07-09 DIAGNOSIS — D631 Anemia in chronic kidney disease: Secondary | ICD-10-CM | POA: Diagnosis not present

## 2020-07-09 DIAGNOSIS — E1129 Type 2 diabetes mellitus with other diabetic kidney complication: Secondary | ICD-10-CM | POA: Diagnosis not present

## 2020-07-09 DIAGNOSIS — N186 End stage renal disease: Secondary | ICD-10-CM | POA: Diagnosis not present

## 2020-07-12 DIAGNOSIS — E1129 Type 2 diabetes mellitus with other diabetic kidney complication: Secondary | ICD-10-CM | POA: Diagnosis not present

## 2020-07-12 DIAGNOSIS — D631 Anemia in chronic kidney disease: Secondary | ICD-10-CM | POA: Diagnosis not present

## 2020-07-12 DIAGNOSIS — N186 End stage renal disease: Secondary | ICD-10-CM | POA: Diagnosis not present

## 2020-07-12 DIAGNOSIS — N2581 Secondary hyperparathyroidism of renal origin: Secondary | ICD-10-CM | POA: Diagnosis not present

## 2020-07-12 DIAGNOSIS — Z992 Dependence on renal dialysis: Secondary | ICD-10-CM | POA: Diagnosis not present

## 2020-07-14 DIAGNOSIS — N186 End stage renal disease: Secondary | ICD-10-CM | POA: Diagnosis not present

## 2020-07-14 DIAGNOSIS — D631 Anemia in chronic kidney disease: Secondary | ICD-10-CM | POA: Diagnosis not present

## 2020-07-14 DIAGNOSIS — Z992 Dependence on renal dialysis: Secondary | ICD-10-CM | POA: Diagnosis not present

## 2020-07-14 DIAGNOSIS — N2581 Secondary hyperparathyroidism of renal origin: Secondary | ICD-10-CM | POA: Diagnosis not present

## 2020-07-14 DIAGNOSIS — E1129 Type 2 diabetes mellitus with other diabetic kidney complication: Secondary | ICD-10-CM | POA: Diagnosis not present

## 2020-07-15 ENCOUNTER — Encounter: Payer: Self-pay | Admitting: Neurology

## 2020-07-15 ENCOUNTER — Ambulatory Visit (INDEPENDENT_AMBULATORY_CARE_PROVIDER_SITE_OTHER): Payer: Medicare Other | Admitting: Neurology

## 2020-07-15 ENCOUNTER — Other Ambulatory Visit: Payer: Self-pay

## 2020-07-15 VITALS — BP 122/65 | HR 104 | Ht 63.0 in | Wt 136.0 lb

## 2020-07-15 DIAGNOSIS — R55 Syncope and collapse: Secondary | ICD-10-CM | POA: Diagnosis not present

## 2020-07-15 DIAGNOSIS — R404 Transient alteration of awareness: Secondary | ICD-10-CM | POA: Diagnosis not present

## 2020-07-15 MED ORDER — LAMOTRIGINE 100 MG PO TABS: 100.0000 mg | ORAL_TABLET | Freq: Two times a day (BID) | ORAL | 3 refills | Status: DC

## 2020-07-15 NOTE — Patient Instructions (Signed)
We will increase the dose of the seizure medication Lamotrigine to 100mg : take 1 tablet twice a day  2. These episodes may still be related to drops in blood pressure. Recommend starting to wear thigh-high compression stockings and check blood pressure during the events.  3. Continue calendar of the episodes, follow-up in 4 months, call for any changes

## 2020-07-15 NOTE — Progress Notes (Signed)
NEUROLOGY FOLLOW UP OFFICE NOTE  Julie Brewer [id number] [birth year]  HISTORY OF PRESENT ILLNESS: I had the pleasure of seeing Julie Brewer in follow-up in the neurology clinic on 07/15/2020.  The patient was last seen 4 months ago for recurrent episodes of loss of consciousness. She is accompanied by her granddaughter Julie Brewer who helps supplement the history today.  Records and images were personally reviewed where available.  Her 24-hour EEG was normal. MRI no acute changes, there was moderate to advanced chronic microvascular disease. On her last visit, we discussed results of cardiac and neurological evaluations, there has been no clear cause of symptoms found. They were reporting monthly episodes of staring, unresponsiveness, she would make a noise like a growl and fall over to the right, slightly disoriented and clammy after. No convulsive activity. We discussed empiric treatment for seizures, which she opted to do. She was started on Lamotrigine 32m BID, which she is tolerating without side effects. Since her last visit, they report 3 events. She had one on 4/14 as she was going in the house, she started feeling wobbly, lost her balance and fell. When her daughter got to her, she was completely passed out, then woke up confused. She was bleeding, no stitches required. EMS was called with normal vital signs. The next episode was on 5/10, which Julie Brewer states is the worst because it is the longest she has been out. She vomited after. She had been sitting and eating. She had bowel and bladder incontinence with both of them. The last episode was on 6/11, she did not completely pass out. They were outside, her daughter saw her become wobbly and made her sit down. She was then unable to stand without 2-person assist. Julie Brewer notes that all of these happened on the afternoons after dialysis. She states she cannot drink a lot of water due to her kidney failure but eats ice. She still has her loop  recorder and was not called about any arrhythmias.   History on Initial Assessment 02/05/2020: This is a pleasant 70year old right-handed woman with a history of hypertension, hyperlipidemia, diabetes, CKD on dialysis, legally blind, stroke in 2010 with residual mild left-sided weakness, presenting for evaluation of recurrent episodes of loss of consciousness. The episodes started around 2-3 years ago. There is no prior warning, she notes that she is eating when they happen most of the time. She has been living with her daughter Julie Cumminssince March 2021, and they report episodes around once a month, however she had 2 in December, last episode was 01/23/20. [name] reports they occur either when eating breakfast or dinner, she would make a noise like a growl, then starts falling over to the right side. She is staring with eyes open, not responding, and would be out for a minute. No jerking/convulsive activity seen. She would be a little disoriented and clammy after. She feels tired and would feel the urge to have a bowel movement. She has had urinary incontinence with some of the episodes. They report a bigger episode in January 2021 when she was more confused and did not recognize her family after, [facility] was note of episodes of hypoglycemia in the past, she had a loop recorder due to these episodes which was normal on interrogation. She denies any olfactory/gustatory hallucinations, deja vu, rising epigastric sensation, focal numbness/tingling/weakness, myoclonic jerks. She denies any significant headaches, dizziness, dysarthria/dysphagia. She lost her vision in 2011. She has dialysis T-Th-F. Her mother used  to have seizures and was found to have a brain tumor. Otherwise she had a normal birth and early development.  There is no history of febrile convulsions, CNS infections such as meningitis/encephalitis, significant traumatic brain injury, neurosurgical procedures.  Diagnostic Data:   MRI brain with and without contrast done 03/2020 did not show any acute changes, there was moderate to advanced chronic microvascular disease, hippocampi symmetric.   Her routine and 24-hour EEGs in January/February 2022 were normal, EKG showed irregular rhythm, typical events were not captured.   She has had a loop recorder since 2018 with no changes during events.     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   CHF (congestive heart failure) ([facility])    Chronic kidney disease    Tu/Th/Sa   Diabetes mellitus    Type II per Dr [name]  (patient said type I)   Fibroid    GERD (gastroesophageal reflux disease)    Glaucoma    Hyperlipidemia    Hypertension    Iron deficiency anemia 03/09/2016   Peripheral vascular disease ([place])    Pneumonia 2006   PONV (postoperative nausea and vomiting)    Shortness of breath dyspnea    with exdrtion, "Walkling too fast"   Stroke ([place])    no residual    MEDICATIONS: Current Outpatient Medications on File Prior to Visit  Medication Sig Dispense Refill   acetaminophen (TYLENOL) 325 MG tablet Take 2 tablets (650 mg total) by mouth every 6 (six) hours as needed for mild pain (or Fever >/= 101).     amLODipine (NORVASC) 10 MG tablet SMARTSIG:.5 Tablet(s) By Mouth Daily     aspirin 325 MG EC tablet Take 325 mg by mouth daily.      atorvastatin (LIPITOR) 20 MG tablet Take 1 tablet by mouth once daily 90 tablet 3   bisacodyl (DULCOLAX) 5 MG EC tablet Take 5 mg by mouth daily as needed for moderate constipation.     Blood Glucose Monitoring Suppl (FREESTYLE FREEDOM) KIT Use to check blood sugar once a day dx code 1 kit 1   cilostazol (PLETAL) 100 MG tablet Take 1 tablet by mouth twice daily 180 tablet 0   docusate sodium (COLACE) 100 MG capsule Take 200 mg by mouth daily.      ferrous sulfate 325 (65 FE) MG tablet  Take 650 mg by mouth daily.     gabapentin (NEURONTIN) 100 MG capsule TAKE 1 CAPSULE BY MOUTH TWICE DAILY AS NEEDED 180 capsule 0   glucose blood (FREESTYLE LITE) test strip USE AS INSTRUCTED TO CHECK BLOOD SUGAR ONCE DAILY. 50 each 3   insulin glargine (LANTUS) 100 UNIT/ML injection Lantus insulin 16 U in the morning daily. 15 mL 1   Insulin Pen Needle 31G X 5 MM MISC Use with pen 100 each 1   insulin regular (NOVOLIN R) 100 units/mL injection Inject into the skin 2 (two) times daily before a meal. Inject 10 units under the skin before breakfast and 5-7 units before dinner.     lamoTRIgine (LAMICTAL) 25 MG tablet Take 1 tablet twice a day for 2 weeks, then increase to 2 tablets twice a day 120 tablet 0   lidocaine-prilocaine (EMLA) cream SMARTSIG:Sparingly Topical     Methoxy PEG-Epoetin Beta (MIRCERA IJ)      omeprazole (PRILOSEC) 20 MG  capsule Take 1 capsule by mouth once daily 90 capsule 0   Vitamin D, Ergocalciferol, (DRISDOL) 1.25 MG (50000 UNIT) CAPS capsule Take 1 capsule by mouth once a week 12 capsule 0   No current facility-administered medications on file prior to visit.    ALLERGIES: Allergies  Allergen Reactions   Morphine And Related Other (See Comments)    Hallucinations    Penicillins Swelling and Rash    Throat swelling Did it involve swelling of the face/tongue/throat, SOB, or low BP? Yes Did it involve sudden or severe rash/hives, skin peeling, or any reaction on the inside of your mouth or nose? Yes Did you need to seek medical attention at a hospital or doctor's office? Yes When did it last happen?   young child    If all above answers are "NO", may proceed with cephalosporin use.    FAMILY HISTORY: Family History  Problem Relation Age of Onset   Cancer Mother    Heart disease Mother    Diabetes Father    Cancer Brother    Cancer Brother    Throat cancer Brother     SOCIAL HISTORY: Social History   Socioeconomic History   Marital status: Legally  Separated    Spouse name: Not on file   Number of children: Not on file   Years of education: Not on file   Highest education level: Not on file  Occupational History   Not on file  Tobacco Use   Smoking status: Every Day    Packs/day: 1.00    Years: 30.00    Pack years: 30.00    Types: Cigarettes   Smokeless tobacco: Never  Vaping Use   Vaping Use: Never used  Substance and Sexual Activity   Alcohol use: Not Currently    Comment: socially   Drug use: Not Currently    Types: Methylphenidate   Sexual activity: Never    Birth control/protection: Post-menopausal, Surgical    Comment: Hysterectomy  Other Topics Concern   Not on file  Social History Narrative   Right handed   Lives with daughter in a one story home   Drinks caffeine   Social Determinants of Health   Financial Resource Strain: Not on file  Food Insecurity: Not on file  Transportation Needs: Not on file  Physical Activity: Not on file  Stress: Not on file  Social Connections: Not on file  Intimate Partner Violence: Not on file     PHYSICAL EXAM: Vitals:   07/15/20 1403  BP: 122/65  Pulse: (!) 104  SpO2: 97%   General: No acute distress Head:  Normocephalic/atraumatic Skin/Extremities: No rash, no edema Neurological Exam: alert and awake. No aphasia or dysarthria. Fund of knowledge is appropriate. Attention and concentration are reduced.   Cranial nerves: Patient is legally blind with no light perception. Extraocular movements intact with no nystagmus. No facial asymmetry.  Motor: Bulk and tone normal, muscle strength 5/5 throughout with no pronator drift.   Finger to nose testing intact.  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 She has had an unremarkable cardiac evaluation and has had a loop recorder since 2018, episodes have not shown any significant arrhythmia.  Her brain MRI and 24-hour EEG were unremarkable. She continues to have recurrent episodes monthly, increase Lamotrigine to 186m BID. Her granddaughter provides additional information that the episodes all have occurred on afternoons after her dialysis, hypotension is still a possibility, she was advised to have family check BP during events and to start wearing compression stockings. Continue event calendar, if episodes continue on Lamotrigine 1061mBID, we will plan for inpatient video EEG monitoring for characterization. She does not drive. Follow-up in 4 months, they know to call for any changes.    Thank you for allowing me to participate in her care.  Please do not hesitate to call for any questions or concerns.   KaEllouise NewerM.D.   CC: Dr. WeJustin Mend

## 2020-07-16 DIAGNOSIS — D631 Anemia in chronic kidney disease: Secondary | ICD-10-CM | POA: Diagnosis not present

## 2020-07-16 DIAGNOSIS — E1129 Type 2 diabetes mellitus with other diabetic kidney complication: Secondary | ICD-10-CM | POA: Diagnosis not present

## 2020-07-16 DIAGNOSIS — N2581 Secondary hyperparathyroidism of renal origin: Secondary | ICD-10-CM | POA: Diagnosis not present

## 2020-07-16 DIAGNOSIS — N186 End stage renal disease: Secondary | ICD-10-CM | POA: Diagnosis not present

## 2020-07-16 DIAGNOSIS — Z992 Dependence on renal dialysis: Secondary | ICD-10-CM | POA: Diagnosis not present

## 2020-07-19 DIAGNOSIS — Z992 Dependence on renal dialysis: Secondary | ICD-10-CM | POA: Diagnosis not present

## 2020-07-19 DIAGNOSIS — N2581 Secondary hyperparathyroidism of renal origin: Secondary | ICD-10-CM | POA: Diagnosis not present

## 2020-07-19 DIAGNOSIS — D631 Anemia in chronic kidney disease: Secondary | ICD-10-CM | POA: Diagnosis not present

## 2020-07-19 DIAGNOSIS — E1129 Type 2 diabetes mellitus with other diabetic kidney complication: Secondary | ICD-10-CM | POA: Diagnosis not present

## 2020-07-19 DIAGNOSIS — N186 End stage renal disease: Secondary | ICD-10-CM | POA: Diagnosis not present

## 2020-07-20 ENCOUNTER — Other Ambulatory Visit: Payer: Self-pay

## 2020-07-20 ENCOUNTER — Ambulatory Visit (INDEPENDENT_AMBULATORY_CARE_PROVIDER_SITE_OTHER): Payer: Medicare Other | Admitting: Podiatry

## 2020-07-20 DIAGNOSIS — B351 Tinea unguium: Secondary | ICD-10-CM | POA: Diagnosis not present

## 2020-07-20 DIAGNOSIS — M79674 Pain in right toe(s): Secondary | ICD-10-CM | POA: Diagnosis not present

## 2020-07-20 DIAGNOSIS — M79675 Pain in left toe(s): Secondary | ICD-10-CM

## 2020-07-20 DIAGNOSIS — E1151 Type 2 diabetes mellitus with diabetic peripheral angiopathy without gangrene: Secondary | ICD-10-CM | POA: Diagnosis not present

## 2020-07-21 DIAGNOSIS — N186 End stage renal disease: Secondary | ICD-10-CM | POA: Diagnosis not present

## 2020-07-21 DIAGNOSIS — N2581 Secondary hyperparathyroidism of renal origin: Secondary | ICD-10-CM | POA: Diagnosis not present

## 2020-07-21 DIAGNOSIS — D631 Anemia in chronic kidney disease: Secondary | ICD-10-CM | POA: Diagnosis not present

## 2020-07-21 DIAGNOSIS — Z992 Dependence on renal dialysis: Secondary | ICD-10-CM | POA: Diagnosis not present

## 2020-07-21 DIAGNOSIS — E1129 Type 2 diabetes mellitus with other diabetic kidney complication: Secondary | ICD-10-CM | POA: Diagnosis not present

## 2020-07-23 DIAGNOSIS — D631 Anemia in chronic kidney disease: Secondary | ICD-10-CM | POA: Diagnosis not present

## 2020-07-23 DIAGNOSIS — E1129 Type 2 diabetes mellitus with other diabetic kidney complication: Secondary | ICD-10-CM | POA: Diagnosis not present

## 2020-07-23 DIAGNOSIS — Z992 Dependence on renal dialysis: Secondary | ICD-10-CM | POA: Diagnosis not present

## 2020-07-23 DIAGNOSIS — N186 End stage renal disease: Secondary | ICD-10-CM | POA: Diagnosis not present

## 2020-07-23 DIAGNOSIS — N2581 Secondary hyperparathyroidism of renal origin: Secondary | ICD-10-CM | POA: Diagnosis not present

## 2020-07-26 ENCOUNTER — Encounter: Payer: Self-pay | Admitting: Podiatry

## 2020-07-26 DIAGNOSIS — E1129 Type 2 diabetes mellitus with other diabetic kidney complication: Secondary | ICD-10-CM | POA: Diagnosis not present

## 2020-07-26 DIAGNOSIS — N186 End stage renal disease: Secondary | ICD-10-CM | POA: Diagnosis not present

## 2020-07-26 DIAGNOSIS — Z992 Dependence on renal dialysis: Secondary | ICD-10-CM | POA: Diagnosis not present

## 2020-07-26 DIAGNOSIS — N2581 Secondary hyperparathyroidism of renal origin: Secondary | ICD-10-CM | POA: Diagnosis not present

## 2020-07-26 DIAGNOSIS — D631 Anemia in chronic kidney disease: Secondary | ICD-10-CM | POA: Diagnosis not present

## 2020-07-26 NOTE — Progress Notes (Signed)
  Subjective:  Patient ID: Julie Brewer, female    DOB: 12/20/1950,  MRN: [id number]  Chief Complaint  Patient presents with   Nail Problem    Nail.callus trim    70 y.o. female returns for the above complaint.  Patient is a type II diabetic whose A1c is 6.1.  Patient presents with thickened elongated mycotic toenails x10.  Patient states that she is not able to cut it herself.  She would like to bring [place] to debride them down.  She denies any other acute complaints.  Objective:  There were no vitals filed for this visit. Podiatric Exam: Vascular: dorsalis pedis and posterior tibial pulses are palpable bilateral. Capillary return is immediate. Temperature gradient is WNL. Skin turgor WNL  Sensorium: Normal Semmes Weinstein monofilament test. Normal tactile sensation bilaterally. Nail Exam: Pt has thick disfigured discolored nails with subungual debris noted bilateral entire nail hallux through fifth toenails Ulcer Exam: There is no evidence of ulcer or pre-ulcerative changes or infection. Orthopedic Exam: Muscle tone and strength are WNL. No limitations in general ROM. No crepitus or effusions noted. HAV  B/L.  Hammer toes 2-5  B/L. Skin: No Porokeratosis. No infection or ulcers.  Submet 3 hyperkeratotic lesion/callus x2.  Pain on palpation.  Assessment & Plan:  Patient was evaluated and treated and all questions answered.  Hammertoe contractures bilateral two through five -I explained to patient the etiology of hammertoe contracture as well as treatment options were discussed. Given that patient has underlying diabetes with contractures of the hammertoes I believe patient will benefit from diabetic shoes to prevent ulceration leading to amputation. Patient agrees with the plan. -Patient is currently wearing diabetic shoes.  No acute complaints noted.  Bilateral submet 3 hyperkeratotic lesion/callus x2 -Resolving  Onychomycosis with pain  -Nails palliatively debrided as  below. -Educated on self-care  Procedure: Nail Debridement Rationale: pain  Type of Debridement: manual, sharp debridement. Instrumentation: Nail nipper, rotary burr. Number of Nails: 10  Procedures and Treatment: Consent by patient was obtained for treatment procedures. The patient understood the discussion of treatment and procedures well. All questions were answered thoroughly reviewed. Debridement of mycotic and hypertrophic toenails, 1 through 5 bilateral and clearing of subungual debris. No ulceration, no infection noted.  Return Visit-Office Procedure: Patient instructed to return to the office for a follow up visit 3 months for continued evaluation and treatment.  Boneta Lucks, DPM    No follow-ups on file.

## 2020-07-28 DIAGNOSIS — Z992 Dependence on renal dialysis: Secondary | ICD-10-CM | POA: Diagnosis not present

## 2020-07-28 DIAGNOSIS — E1129 Type 2 diabetes mellitus with other diabetic kidney complication: Secondary | ICD-10-CM | POA: Diagnosis not present

## 2020-07-28 DIAGNOSIS — D631 Anemia in chronic kidney disease: Secondary | ICD-10-CM | POA: Diagnosis not present

## 2020-07-28 DIAGNOSIS — N186 End stage renal disease: Secondary | ICD-10-CM | POA: Diagnosis not present

## 2020-07-28 DIAGNOSIS — N2581 Secondary hyperparathyroidism of renal origin: Secondary | ICD-10-CM | POA: Diagnosis not present

## 2020-07-29 DIAGNOSIS — Z992 Dependence on renal dialysis: Secondary | ICD-10-CM | POA: Diagnosis not present

## 2020-07-29 DIAGNOSIS — E1122 Type 2 diabetes mellitus with diabetic chronic kidney disease: Secondary | ICD-10-CM | POA: Diagnosis not present

## 2020-07-29 DIAGNOSIS — N186 End stage renal disease: Secondary | ICD-10-CM | POA: Diagnosis not present

## 2020-07-30 DIAGNOSIS — N2581 Secondary hyperparathyroidism of renal origin: Secondary | ICD-10-CM | POA: Diagnosis not present

## 2020-07-30 DIAGNOSIS — D631 Anemia in chronic kidney disease: Secondary | ICD-10-CM | POA: Diagnosis not present

## 2020-07-30 DIAGNOSIS — E1129 Type 2 diabetes mellitus with other diabetic kidney complication: Secondary | ICD-10-CM | POA: Diagnosis not present

## 2020-07-30 DIAGNOSIS — N186 End stage renal disease: Secondary | ICD-10-CM | POA: Diagnosis not present

## 2020-07-30 DIAGNOSIS — Z992 Dependence on renal dialysis: Secondary | ICD-10-CM | POA: Diagnosis not present

## 2020-08-02 DIAGNOSIS — Z992 Dependence on renal dialysis: Secondary | ICD-10-CM | POA: Diagnosis not present

## 2020-08-02 DIAGNOSIS — E1129 Type 2 diabetes mellitus with other diabetic kidney complication: Secondary | ICD-10-CM | POA: Diagnosis not present

## 2020-08-02 DIAGNOSIS — N186 End stage renal disease: Secondary | ICD-10-CM | POA: Diagnosis not present

## 2020-08-02 DIAGNOSIS — D631 Anemia in chronic kidney disease: Secondary | ICD-10-CM | POA: Diagnosis not present

## 2020-08-02 DIAGNOSIS — N2581 Secondary hyperparathyroidism of renal origin: Secondary | ICD-10-CM | POA: Diagnosis not present

## 2020-08-04 ENCOUNTER — Telehealth: Payer: Self-pay | Admitting: Dietician

## 2020-08-04 DIAGNOSIS — E1129 Type 2 diabetes mellitus with other diabetic kidney complication: Secondary | ICD-10-CM | POA: Diagnosis not present

## 2020-08-04 DIAGNOSIS — N2581 Secondary hyperparathyroidism of renal origin: Secondary | ICD-10-CM | POA: Diagnosis not present

## 2020-08-04 DIAGNOSIS — Z992 Dependence on renal dialysis: Secondary | ICD-10-CM | POA: Diagnosis not present

## 2020-08-04 DIAGNOSIS — N186 End stage renal disease: Secondary | ICD-10-CM | POA: Diagnosis not present

## 2020-08-04 DIAGNOSIS — D631 Anemia in chronic kidney disease: Secondary | ICD-10-CM | POA: Diagnosis not present

## 2020-08-04 NOTE — Telephone Encounter (Signed)
Returned patient's daughter's call. Dedee has the [employer] and now needs training.  Appointment made for training 08/05/20 at 2:30.  Antonieta Iba, RD, LDN, CDCES

## 2020-08-05 ENCOUNTER — Encounter: Payer: Medicare Other | Attending: Endocrinology | Admitting: Dietician

## 2020-08-05 ENCOUNTER — Encounter: Payer: Self-pay | Admitting: Dietician

## 2020-08-05 ENCOUNTER — Other Ambulatory Visit: Payer: Self-pay

## 2020-08-05 DIAGNOSIS — N186 End stage renal disease: Secondary | ICD-10-CM | POA: Insufficient documentation

## 2020-08-05 DIAGNOSIS — Z794 Long term (current) use of insulin: Secondary | ICD-10-CM | POA: Insufficient documentation

## 2020-08-05 DIAGNOSIS — Z992 Dependence on renal dialysis: Secondary | ICD-10-CM | POA: Insufficient documentation

## 2020-08-05 DIAGNOSIS — E1122 Type 2 diabetes mellitus with diabetic chronic kidney disease: Secondary | ICD-10-CM | POA: Insufficient documentation

## 2020-08-05 NOTE — Progress Notes (Signed)
[employer] Personal CGM [employer] [phone]   End time: 1500 Total time: 15 minutes Patient is here today with her daughter. Patient is legally blind.  She receives hemodialysis in her left arm Tuesday, Thursday, and Saturday.  Educated to avoid left arm placement of CGM.  Julie Brewer and her daughter were educated about the following:  -Getting to know device    IT trainer) -Setting up device , trend arrows - sensor interaction with vitamin C: do not take more than 500 mg vitamin C daily -Inserting sensor (patient's daughter applied the sensor on her right upper back of arm today with minimal assist. It appeared to be working and in warm up when he left the office) - using meter when test blood sugar symbol appears -Trouble shooting -Tape guide, ability to upload from home with email address (he says he nor wife do email)   Patient has [employer] tech support and my contact information and [employer] who sent her the product.  Antonieta Iba, RD, LDN, CDCES

## 2020-08-06 DIAGNOSIS — Z992 Dependence on renal dialysis: Secondary | ICD-10-CM | POA: Diagnosis not present

## 2020-08-06 DIAGNOSIS — D631 Anemia in chronic kidney disease: Secondary | ICD-10-CM | POA: Diagnosis not present

## 2020-08-06 DIAGNOSIS — E1129 Type 2 diabetes mellitus with other diabetic kidney complication: Secondary | ICD-10-CM | POA: Diagnosis not present

## 2020-08-06 DIAGNOSIS — N2581 Secondary hyperparathyroidism of renal origin: Secondary | ICD-10-CM | POA: Diagnosis not present

## 2020-08-06 DIAGNOSIS — N186 End stage renal disease: Secondary | ICD-10-CM | POA: Diagnosis not present

## 2020-08-09 DIAGNOSIS — Z992 Dependence on renal dialysis: Secondary | ICD-10-CM | POA: Diagnosis not present

## 2020-08-09 DIAGNOSIS — N2581 Secondary hyperparathyroidism of renal origin: Secondary | ICD-10-CM | POA: Diagnosis not present

## 2020-08-09 DIAGNOSIS — D631 Anemia in chronic kidney disease: Secondary | ICD-10-CM | POA: Diagnosis not present

## 2020-08-09 DIAGNOSIS — E1129 Type 2 diabetes mellitus with other diabetic kidney complication: Secondary | ICD-10-CM | POA: Diagnosis not present

## 2020-08-09 DIAGNOSIS — N186 End stage renal disease: Secondary | ICD-10-CM | POA: Diagnosis not present

## 2020-08-10 ENCOUNTER — Other Ambulatory Visit: Payer: Self-pay | Admitting: Endocrinology

## 2020-08-10 ENCOUNTER — Other Ambulatory Visit: Payer: Self-pay

## 2020-08-10 ENCOUNTER — Other Ambulatory Visit (INDEPENDENT_AMBULATORY_CARE_PROVIDER_SITE_OTHER): Payer: Medicare Other

## 2020-08-10 DIAGNOSIS — Z23 Encounter for immunization: Secondary | ICD-10-CM | POA: Diagnosis not present

## 2020-08-10 DIAGNOSIS — E782 Mixed hyperlipidemia: Secondary | ICD-10-CM | POA: Diagnosis not present

## 2020-08-10 DIAGNOSIS — Z794 Long term (current) use of insulin: Secondary | ICD-10-CM | POA: Diagnosis not present

## 2020-08-10 DIAGNOSIS — E1165 Type 2 diabetes mellitus with hyperglycemia: Secondary | ICD-10-CM

## 2020-08-11 DIAGNOSIS — E1129 Type 2 diabetes mellitus with other diabetic kidney complication: Secondary | ICD-10-CM | POA: Diagnosis not present

## 2020-08-11 DIAGNOSIS — N2581 Secondary hyperparathyroidism of renal origin: Secondary | ICD-10-CM | POA: Diagnosis not present

## 2020-08-11 DIAGNOSIS — Z992 Dependence on renal dialysis: Secondary | ICD-10-CM | POA: Diagnosis not present

## 2020-08-11 DIAGNOSIS — D631 Anemia in chronic kidney disease: Secondary | ICD-10-CM | POA: Diagnosis not present

## 2020-08-11 DIAGNOSIS — N186 End stage renal disease: Secondary | ICD-10-CM | POA: Diagnosis not present

## 2020-08-11 LAB — LIPID PANEL
Cholesterol: 157 mg/dL (ref 0–200)
HDL: 47.6 mg/dL (ref >39.00)
LDL Cholesterol: 71 mg/dL (ref 0–99)
NonHDL: 109.39
Total CHOL/HDL Ratio: 3
Triglycerides: 192 mg/dL — ABNORMAL HIGH (ref 0.0–149.0)
VLDL: 38.4 mg/dL (ref 0.0–40.0)

## 2020-08-11 LAB — ALT: ALT: 9 U/L (ref 0–35)

## 2020-08-11 LAB — HEMOGLOBIN A1C: Hgb A1c MFr Bld: 6.3 % (ref 4.6–6.5)

## 2020-08-11 LAB — GLUCOSE, RANDOM: Glucose, Bld: 127 mg/dL — ABNORMAL HIGH (ref 70–99)

## 2020-08-13 DIAGNOSIS — N2581 Secondary hyperparathyroidism of renal origin: Secondary | ICD-10-CM | POA: Diagnosis not present

## 2020-08-13 DIAGNOSIS — E1129 Type 2 diabetes mellitus with other diabetic kidney complication: Secondary | ICD-10-CM | POA: Diagnosis not present

## 2020-08-13 DIAGNOSIS — Z992 Dependence on renal dialysis: Secondary | ICD-10-CM | POA: Diagnosis not present

## 2020-08-13 DIAGNOSIS — D631 Anemia in chronic kidney disease: Secondary | ICD-10-CM | POA: Diagnosis not present

## 2020-08-13 DIAGNOSIS — N186 End stage renal disease: Secondary | ICD-10-CM | POA: Diagnosis not present

## 2020-08-16 DIAGNOSIS — N2581 Secondary hyperparathyroidism of renal origin: Secondary | ICD-10-CM | POA: Diagnosis not present

## 2020-08-16 DIAGNOSIS — D631 Anemia in chronic kidney disease: Secondary | ICD-10-CM | POA: Diagnosis not present

## 2020-08-16 DIAGNOSIS — E1129 Type 2 diabetes mellitus with other diabetic kidney complication: Secondary | ICD-10-CM | POA: Diagnosis not present

## 2020-08-16 DIAGNOSIS — N186 End stage renal disease: Secondary | ICD-10-CM | POA: Diagnosis not present

## 2020-08-16 DIAGNOSIS — Z992 Dependence on renal dialysis: Secondary | ICD-10-CM | POA: Diagnosis not present

## 2020-08-17 ENCOUNTER — Ambulatory Visit (INDEPENDENT_AMBULATORY_CARE_PROVIDER_SITE_OTHER): Payer: Medicare Other | Admitting: Endocrinology

## 2020-08-17 ENCOUNTER — Other Ambulatory Visit: Payer: Self-pay

## 2020-08-17 ENCOUNTER — Encounter: Payer: Self-pay | Admitting: Endocrinology

## 2020-08-17 VITALS — BP 142/70 | HR 84 | Ht 63.0 in | Wt 135.2 lb

## 2020-08-17 DIAGNOSIS — Z794 Long term (current) use of insulin: Secondary | ICD-10-CM | POA: Diagnosis not present

## 2020-08-17 DIAGNOSIS — N186 End stage renal disease: Secondary | ICD-10-CM

## 2020-08-17 DIAGNOSIS — E1165 Type 2 diabetes mellitus with hyperglycemia: Secondary | ICD-10-CM | POA: Diagnosis not present

## 2020-08-17 DIAGNOSIS — Z992 Dependence on renal dialysis: Secondary | ICD-10-CM | POA: Diagnosis not present

## 2020-08-17 DIAGNOSIS — E782 Mixed hyperlipidemia: Secondary | ICD-10-CM | POA: Diagnosis not present

## 2020-08-17 NOTE — Patient Instructions (Addendum)
Check at 9 pm daily  Lantus 14 in am  R insulin 12 at supper

## 2020-08-17 NOTE — Progress Notes (Signed)
  Patient ID: Julie Brewer, female   DOB: 12/14/1950, 69 y.o.   MRN: [id number]   Reason for Appointment: Follow-up of various problems  History of Present Illness    Diagnosis: Type 2 DIABETES MELITUS, date of diagnosis:  1985  Prior history: She has been on insulin since diagnosis and on Lantus previously Also at some point had been started on Glucophage several years ago also Because of insurance preference Lantus was changed to Levemir  She refuses to use analog rapid acting insulin because of cost and is using regular insulin for several years   Her blood sugars are generally well controlled and A1c usually under 7% Her A1c previously was higher with stopping metformin at 8%  Recent history:   Insulin regimen: Lantus insulin 16 U in the morning daily.  Regular insulin 20-30 minutes Before eating, 10 units a.m. and 10 ac supper  Oral hypoglycemic drugs: None   Current blood sugar patterns, management and problems:  Her A1c is much better at 6.3 compared to 7.7   She has not been seen since 1/22  She was recommended Tresiba and NovoLog on her last visit because of variability in her blood sugars but she could not afford this She is back on the same doses of Lantus and regular insulin  Also has been able to use the freestyle libre at home with the help of the family  However she is not checking her blood sugars after dinner  Also has not checked any fingerstick readings to compare Compared to her lab glucose fingersticks may be relatively low although unclear what time she had her labs drawn also appears to have significant hyperglycemia with her protein shake that she has mid afternoon daily, not clear what the contents are of this Her blood sugars appear to be more stable after breakfast Most of the time she appears to be having higher readings after dinner but data is somewhat incomplete This is despite avoiding high-fat meals, desserts or fried food  Her  daughter is drawing up her insulin and the syringe No hypoglycemic symptoms, she has hypoglycemia unawareness However she appears to have relatively low sugars overnight on 3 nights without any correlation with the actual sugars   Side effects from medications: None      Glucometer:  FreeStyle libre   CGM use % of time 44  2-week average/GV 108/38  Time in range    78%  % Time Above 180 9  % Time above 250   % Time Below 70 13     PRE-MEAL Fasting Lunch Dinner 4-6 AM Overall  Glucose range:       Averages: 86  105 78    POST-MEAL PC Breakfast PC Lunch PC Dinner  Glucose range:     Averages: 126  175      Previous data from 1/22:   CGM use % of time  97  Average and GV  177+/-43  Time in range      46%  % Time Above 180  31  % Time above 250  16  % Time Below target 7    PRE-MEAL Fasting Lunch Dinner Bedtime Overall  Glucose range:       Mean/median:  120   183  210  177   POST-MEAL PC Breakfast PC Lunch PC Dinner  Glucose range:     Mean/median:  215   254      Meals:  usually 2 meals per day at 10   AM and 5 PM.     Mealtime protein sources:turkey, chicken.  Eating cereal for breakfast Or oatmeal Avoiding sweet drinks  Physical activity: exercise: Some walking within the house              Wt Readings from Last 3 Encounters:  08/17/20 135 lb 3.2 oz (61.3 kg)  07/15/20 136 lb (61.7 kg)  05/23/20 138 lb (62.6 kg)   Lab Results  Component Value Date   HGBA1C 6.3 08/10/2020   HGBA1C 7.7 (H) 02/03/2020   HGBA1C 6.5 09/28/2019   Lab Results  Component Value Date   MICROALBUR 196.7 (H) 03/11/2018   LDLCALC 71 08/10/2020   CREATININE 2.44 (H) 06/25/2019      Lab Results  Component Value Date   FRUCTOSAMINE 275 05/18/2020   FRUCTOSAMINE 368 (H) 02/03/2020   FRUCTOSAMINE 293 (H) 09/28/2019   FRUCTOSAMINE 218 03/11/2018    Other active problems: See review of systems      Allergies as of 08/17/2020       Reactions   Morphine And Related  Other (See Comments)   Hallucinations    Penicillins Swelling, Rash   Throat swelling Did it involve swelling of the face/tongue/throat, SOB, or low BP? Yes Did it involve sudden or severe rash/hives, skin peeling, or any reaction on the inside of your mouth or nose? Yes Did you need to seek medical attention at a hospital or doctor's office? Yes When did it last happen?   young child    If all above answers are "NO", may proceed with cephalosporin use.        Medication List        Accurate as of August 17, 2020 11:59 PM. If you have any questions, ask your nurse or doctor.          acetaminophen 325 MG tablet Commonly known as: TYLENOL Take 2 tablets (650 mg total) by mouth every 6 (six) hours as needed for mild pain (or Fever >/= 101).   amLODipine 10 MG tablet Commonly known as: NORVASC SMARTSIG:.5 Tablet(s) By Mouth Daily   aspirin 325 MG EC tablet Take 325 mg by mouth daily.   atorvastatin 20 MG tablet Commonly known as: LIPITOR Take 1 tablet by mouth once daily   bisacodyl 5 MG EC tablet Commonly known as: DULCOLAX Take 5 mg by mouth daily as needed for moderate constipation.   cilostazol 100 MG tablet Commonly known as: PLETAL Take 1 tablet by mouth twice daily   docusate sodium 100 MG capsule Commonly known as: COLACE Take 200 mg by mouth daily.   ferrous sulfate 325 (65 FE) MG tablet Take 650 mg by mouth daily.   FreeStyle Freedom Kit Use to check blood sugar once a day dx code   FREESTYLE LITE test strip Generic drug: glucose blood USE AS INSTRUCTED TO CHECK BLOOD SUGAR ONCE DAILY.   gabapentin 100 MG capsule Commonly known as: NEURONTIN TAKE 1 CAPSULE BY MOUTH TWICE DAILY AS NEEDED   insulin glargine 100 UNIT/ML injection Commonly known as: LANTUS Lantus insulin 16 U in the morning daily.   Insulin Pen Needle 31G X 5 MM Misc Use with pen   insulin regular 100 units/mL injection Commonly known as: NOVOLIN R Inject into the skin 2 (two)  times daily before a meal. Inject 10 units under the skin before breakfast and 5-7 units before dinner.   lamoTRIgine 100 MG tablet Commonly known as: LaMICtal Take 1 tablet (100 mg total) by mouth 2 (two)  times daily.   lidocaine-prilocaine cream Commonly known as: EMLA SMARTSIG:Sparingly Topical   MIRCERA IJ   omeprazole 20 MG capsule Commonly known as: PRILOSEC Take 1 capsule by mouth once daily   Vitamin D (Ergocalciferol) 1.25 MG (50000 UNIT) Caps capsule Commonly known as: DRISDOL Take 1 capsule by mouth once a week        Allergies:  Allergies  Allergen Reactions   Morphine And Related Other (See Comments)    Hallucinations    Penicillins Swelling and Rash    Throat swelling Did it involve swelling of the face/tongue/throat, SOB, or low BP? Yes Did it involve sudden or severe rash/hives, skin peeling, or any reaction on the inside of your mouth or nose? Yes Did you need to seek medical attention at a hospital or doctor's office? Yes When did it last happen?   young child    If all above answers are "NO", may proceed with cephalosporin use.    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   CHF (congestive heart failure) (HCC)    Chronic kidney disease    Tu/Th/Sa   Diabetes mellitus    Type II per Dr   (patient said type I)   Fibroid    GERD (gastroesophageal reflux disease)    Glaucoma    Hyperlipidemia    Hypertension    Iron deficiency anemia 03/09/2016   Peripheral vascular disease (HCC)    Pneumonia 2006   PONV (postoperative nausea and vomiting)    Shortness of breath dyspnea    with exdrtion, "Walkling too fast"   Stroke (HCC)    no residual    Past Surgical History:  Procedure Laterality Date   ABDOMINAL HYSTERECTOMY     AV FISTULA PLACEMENT Left 04/27/2019   Procedure: INSERTION OF ARTERIOVENOUS (AV) GORE-TEX GRAFT LEFT  UPPER ARM;  Surgeon: Dickson, Christopher S, MD;  Location: [facility] OR;  Service: Vascular;  Laterality: Left;   BIOPSY  06/10/2017   Procedure: BIOPSY;  Surgeon: Karki, Arya, MD;  Location: [facility];  Service: Gastroenterology;;   BREAST BIOPSY Right    CERVICAL FUSION     with graft from hip   COLONOSCOPY  July 09, 2012   DIRECT LARYNGOSCOPY N/A 06/07/2014   Procedure: DIRECT LARYNGOSCOPY with BIOPSY and EXCISION VOLLECULAR CYST;  Surgeon: Mitchell Gore, MD;  Location: [facility] OR;  Service: ENT;  Laterality: N/A;   ESOPHAGOGASTRODUODENOSCOPY (EGD) WITH PROPOFOL Left 06/10/2017   Procedure: ESOPHAGOGASTRODUODENOSCOPY (EGD) WITH PROPOFOL;  Surgeon: Karki, Arya, MD;  Location: [facility];  Service: Gastroenterology;  Laterality: Left;   FLEXIBLE SIGMOIDOSCOPY Left 06/10/2017   Procedure: FLEXIBLE SIGMOIDOSCOPY;  Surgeon: Karki, Arya, MD;  Location: [facility];  Service: Gastroenterology;  Laterality: Left;   LOOP RECORDER INSERTION N/A 06/20/2016   Procedure: Loop Recorder Insertion;  Surgeon: Croitoru, Mihai, MD;  Location: [facility];  Service: Cardiovascular;  Laterality: N/A;   REFRACTIVE SURGERY Bilateral    both eyes   SPINE SURGERY     lumbar    Family History  Problem Relation Age of Onset   Cancer Mother    Heart disease Mother    Diabetes Father    Cancer Brother    Cancer Brother    Throat cancer Brother     Social History:  reports that she has been smoking cigarettes. She has a 30.00   pack-year smoking history. She has never used smokeless tobacco. She reports previous alcohol use. She reports previous drug use. Drug: Methylphenidate.  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      HYPERLIPIDEMIA: The lipid abnormality consists of elevated LDL controlled with Lipitor 20 mg .   Her baseline LDL was 202  She is taking this regularly with good results .  Lab Results  Component Value Date   CHOL 157 08/10/2020   HDL 47.60 08/10/2020   LDLCALC 71 08/10/2020   TRIG 192.0 (H) 08/10/2020   CHOLHDL 3 08/10/2020       Last diabetic foot exam was done in 9/21  Has been taking gabapentin 100mg twice daily for lower leg leg pains She says that this helps her symptoms consistently  She has had her Covid vaccines   Examination:   BP (!) 142/70   Pulse 84   Ht 5' 3" (1.6 m)   Wt 135 lb 3.2 oz (61.3 kg)   SpO2 99%   BMI 23.95 kg/m   Body mass index is 23.95 kg/m.    ASSESSMENT/ PLAN:     Diabetes type 2 on insulin See history of present illness for detailed discussion of current diabetes management, blood sugar patterns and problems identified  Her A1c is much better at 6.3 compared to 7.7  She is on basal bolus insulin with Lantus and regular insulin  Using her own freestyle libre system but this appears to be reading falsely low by up to 20%  Although she appears to have some overnight hypoglycemia this is likely to be not accurate Reviewed her meal patterns, timing of injections, causes of high or low sugars as well as adjustment of mealtime dose based on carbohydrates Currently using syringes which her daughter is drawing up as before  HYPERLIPIDEMIA: She has excellent control with Lipitor and no side effects  Recommendations: She will start checking blood sugars with the freestyle libre sensor more consistently especially at bedtime Also occasionally can check fingerstick readings if able to with the help of the family She will use an average dose of 12 units of regular insulin before suppertime and if eating a light meal with less carbohydrates can reduce it down to 10 If her blood sugars are consistently around 200 after eating dinner she  can also take up to 14 units when eating more carbohydrate As a precaution reduce LANTUS down to 14 units in the morning Have some protein at breakfast daily Continue Lipitor unchanged      Patient Instructions  Check at 9 pm daily  Lantus 14 in am  R insulin 12 at supper    08/18/2020, 11:56 AM     Note: This office note was prepared with Dragon voice recognition system technology. Any transcriptional errors that result from this process are unintentional.   

## 2020-08-18 DIAGNOSIS — D631 Anemia in chronic kidney disease: Secondary | ICD-10-CM | POA: Diagnosis not present

## 2020-08-18 DIAGNOSIS — Z992 Dependence on renal dialysis: Secondary | ICD-10-CM | POA: Diagnosis not present

## 2020-08-18 DIAGNOSIS — N2581 Secondary hyperparathyroidism of renal origin: Secondary | ICD-10-CM | POA: Diagnosis not present

## 2020-08-18 DIAGNOSIS — N186 End stage renal disease: Secondary | ICD-10-CM | POA: Diagnosis not present

## 2020-08-18 DIAGNOSIS — E1129 Type 2 diabetes mellitus with other diabetic kidney complication: Secondary | ICD-10-CM | POA: Diagnosis not present

## 2020-08-20 DIAGNOSIS — Z992 Dependence on renal dialysis: Secondary | ICD-10-CM | POA: Diagnosis not present

## 2020-08-20 DIAGNOSIS — N2581 Secondary hyperparathyroidism of renal origin: Secondary | ICD-10-CM | POA: Diagnosis not present

## 2020-08-20 DIAGNOSIS — D631 Anemia in chronic kidney disease: Secondary | ICD-10-CM | POA: Diagnosis not present

## 2020-08-20 DIAGNOSIS — N186 End stage renal disease: Secondary | ICD-10-CM | POA: Diagnosis not present

## 2020-08-20 DIAGNOSIS — E1129 Type 2 diabetes mellitus with other diabetic kidney complication: Secondary | ICD-10-CM | POA: Diagnosis not present

## 2020-08-22 DIAGNOSIS — Z720 Tobacco use: Secondary | ICD-10-CM | POA: Diagnosis not present

## 2020-08-22 DIAGNOSIS — I70212 Atherosclerosis of native arteries of extremities with intermittent claudication, left leg: Secondary | ICD-10-CM | POA: Diagnosis not present

## 2020-08-22 DIAGNOSIS — E1122 Type 2 diabetes mellitus with diabetic chronic kidney disease: Secondary | ICD-10-CM | POA: Diagnosis not present

## 2020-08-22 DIAGNOSIS — E785 Hyperlipidemia, unspecified: Secondary | ICD-10-CM | POA: Diagnosis not present

## 2020-08-22 DIAGNOSIS — Z992 Dependence on renal dialysis: Secondary | ICD-10-CM | POA: Diagnosis not present

## 2020-08-22 DIAGNOSIS — I10 Essential (primary) hypertension: Secondary | ICD-10-CM | POA: Diagnosis not present

## 2020-08-22 DIAGNOSIS — Z0000 Encounter for general adult medical examination without abnormal findings: Secondary | ICD-10-CM | POA: Diagnosis not present

## 2020-08-22 DIAGNOSIS — N186 End stage renal disease: Secondary | ICD-10-CM | POA: Diagnosis not present

## 2020-08-23 DIAGNOSIS — N186 End stage renal disease: Secondary | ICD-10-CM | POA: Diagnosis not present

## 2020-08-23 DIAGNOSIS — N2581 Secondary hyperparathyroidism of renal origin: Secondary | ICD-10-CM | POA: Diagnosis not present

## 2020-08-23 DIAGNOSIS — Z992 Dependence on renal dialysis: Secondary | ICD-10-CM | POA: Diagnosis not present

## 2020-08-23 DIAGNOSIS — D631 Anemia in chronic kidney disease: Secondary | ICD-10-CM | POA: Diagnosis not present

## 2020-08-23 DIAGNOSIS — E1129 Type 2 diabetes mellitus with other diabetic kidney complication: Secondary | ICD-10-CM | POA: Diagnosis not present

## 2020-08-25 DIAGNOSIS — E1129 Type 2 diabetes mellitus with other diabetic kidney complication: Secondary | ICD-10-CM | POA: Diagnosis not present

## 2020-08-25 DIAGNOSIS — N186 End stage renal disease: Secondary | ICD-10-CM | POA: Diagnosis not present

## 2020-08-25 DIAGNOSIS — D631 Anemia in chronic kidney disease: Secondary | ICD-10-CM | POA: Diagnosis not present

## 2020-08-25 DIAGNOSIS — Z992 Dependence on renal dialysis: Secondary | ICD-10-CM | POA: Diagnosis not present

## 2020-08-25 DIAGNOSIS — N2581 Secondary hyperparathyroidism of renal origin: Secondary | ICD-10-CM | POA: Diagnosis not present

## 2020-08-27 DIAGNOSIS — Z992 Dependence on renal dialysis: Secondary | ICD-10-CM | POA: Diagnosis not present

## 2020-08-27 DIAGNOSIS — D631 Anemia in chronic kidney disease: Secondary | ICD-10-CM | POA: Diagnosis not present

## 2020-08-27 DIAGNOSIS — N186 End stage renal disease: Secondary | ICD-10-CM | POA: Diagnosis not present

## 2020-08-27 DIAGNOSIS — N2581 Secondary hyperparathyroidism of renal origin: Secondary | ICD-10-CM | POA: Diagnosis not present

## 2020-08-27 DIAGNOSIS — E1129 Type 2 diabetes mellitus with other diabetic kidney complication: Secondary | ICD-10-CM | POA: Diagnosis not present

## 2020-08-29 DIAGNOSIS — Z992 Dependence on renal dialysis: Secondary | ICD-10-CM | POA: Diagnosis not present

## 2020-08-29 DIAGNOSIS — N186 End stage renal disease: Secondary | ICD-10-CM | POA: Diagnosis not present

## 2020-08-29 DIAGNOSIS — E1122 Type 2 diabetes mellitus with diabetic chronic kidney disease: Secondary | ICD-10-CM | POA: Diagnosis not present

## 2020-08-30 DIAGNOSIS — Z992 Dependence on renal dialysis: Secondary | ICD-10-CM | POA: Diagnosis not present

## 2020-08-30 DIAGNOSIS — N186 End stage renal disease: Secondary | ICD-10-CM | POA: Diagnosis not present

## 2020-08-30 DIAGNOSIS — D631 Anemia in chronic kidney disease: Secondary | ICD-10-CM | POA: Diagnosis not present

## 2020-08-30 DIAGNOSIS — D509 Iron deficiency anemia, unspecified: Secondary | ICD-10-CM | POA: Diagnosis not present

## 2020-08-30 DIAGNOSIS — E1129 Type 2 diabetes mellitus with other diabetic kidney complication: Secondary | ICD-10-CM | POA: Diagnosis not present

## 2020-08-30 DIAGNOSIS — N2581 Secondary hyperparathyroidism of renal origin: Secondary | ICD-10-CM | POA: Diagnosis not present

## 2020-09-01 DIAGNOSIS — N2581 Secondary hyperparathyroidism of renal origin: Secondary | ICD-10-CM | POA: Diagnosis not present

## 2020-09-01 DIAGNOSIS — Z992 Dependence on renal dialysis: Secondary | ICD-10-CM | POA: Diagnosis not present

## 2020-09-01 DIAGNOSIS — E1129 Type 2 diabetes mellitus with other diabetic kidney complication: Secondary | ICD-10-CM | POA: Diagnosis not present

## 2020-09-01 DIAGNOSIS — D631 Anemia in chronic kidney disease: Secondary | ICD-10-CM | POA: Diagnosis not present

## 2020-09-01 DIAGNOSIS — D509 Iron deficiency anemia, unspecified: Secondary | ICD-10-CM | POA: Diagnosis not present

## 2020-09-01 DIAGNOSIS — N186 End stage renal disease: Secondary | ICD-10-CM | POA: Diagnosis not present

## 2020-09-03 DIAGNOSIS — N2581 Secondary hyperparathyroidism of renal origin: Secondary | ICD-10-CM | POA: Diagnosis not present

## 2020-09-03 DIAGNOSIS — D631 Anemia in chronic kidney disease: Secondary | ICD-10-CM | POA: Diagnosis not present

## 2020-09-03 DIAGNOSIS — Z992 Dependence on renal dialysis: Secondary | ICD-10-CM | POA: Diagnosis not present

## 2020-09-03 DIAGNOSIS — D509 Iron deficiency anemia, unspecified: Secondary | ICD-10-CM | POA: Diagnosis not present

## 2020-09-03 DIAGNOSIS — N186 End stage renal disease: Secondary | ICD-10-CM | POA: Diagnosis not present

## 2020-09-03 DIAGNOSIS — E1129 Type 2 diabetes mellitus with other diabetic kidney complication: Secondary | ICD-10-CM | POA: Diagnosis not present

## 2020-09-06 DIAGNOSIS — D631 Anemia in chronic kidney disease: Secondary | ICD-10-CM | POA: Diagnosis not present

## 2020-09-06 DIAGNOSIS — N186 End stage renal disease: Secondary | ICD-10-CM | POA: Diagnosis not present

## 2020-09-06 DIAGNOSIS — N2581 Secondary hyperparathyroidism of renal origin: Secondary | ICD-10-CM | POA: Diagnosis not present

## 2020-09-06 DIAGNOSIS — D509 Iron deficiency anemia, unspecified: Secondary | ICD-10-CM | POA: Diagnosis not present

## 2020-09-06 DIAGNOSIS — Z992 Dependence on renal dialysis: Secondary | ICD-10-CM | POA: Diagnosis not present

## 2020-09-06 DIAGNOSIS — E1129 Type 2 diabetes mellitus with other diabetic kidney complication: Secondary | ICD-10-CM | POA: Diagnosis not present

## 2020-09-08 DIAGNOSIS — Z992 Dependence on renal dialysis: Secondary | ICD-10-CM | POA: Diagnosis not present

## 2020-09-08 DIAGNOSIS — E1129 Type 2 diabetes mellitus with other diabetic kidney complication: Secondary | ICD-10-CM | POA: Diagnosis not present

## 2020-09-08 DIAGNOSIS — D631 Anemia in chronic kidney disease: Secondary | ICD-10-CM | POA: Diagnosis not present

## 2020-09-08 DIAGNOSIS — N186 End stage renal disease: Secondary | ICD-10-CM | POA: Diagnosis not present

## 2020-09-08 DIAGNOSIS — N2581 Secondary hyperparathyroidism of renal origin: Secondary | ICD-10-CM | POA: Diagnosis not present

## 2020-09-08 DIAGNOSIS — D509 Iron deficiency anemia, unspecified: Secondary | ICD-10-CM | POA: Diagnosis not present

## 2020-09-10 DIAGNOSIS — N2581 Secondary hyperparathyroidism of renal origin: Secondary | ICD-10-CM | POA: Diagnosis not present

## 2020-09-10 DIAGNOSIS — Z992 Dependence on renal dialysis: Secondary | ICD-10-CM | POA: Diagnosis not present

## 2020-09-10 DIAGNOSIS — D509 Iron deficiency anemia, unspecified: Secondary | ICD-10-CM | POA: Diagnosis not present

## 2020-09-10 DIAGNOSIS — D631 Anemia in chronic kidney disease: Secondary | ICD-10-CM | POA: Diagnosis not present

## 2020-09-10 DIAGNOSIS — N186 End stage renal disease: Secondary | ICD-10-CM | POA: Diagnosis not present

## 2020-09-10 DIAGNOSIS — E1129 Type 2 diabetes mellitus with other diabetic kidney complication: Secondary | ICD-10-CM | POA: Diagnosis not present

## 2020-09-13 DIAGNOSIS — D631 Anemia in chronic kidney disease: Secondary | ICD-10-CM | POA: Diagnosis not present

## 2020-09-13 DIAGNOSIS — N2581 Secondary hyperparathyroidism of renal origin: Secondary | ICD-10-CM | POA: Diagnosis not present

## 2020-09-13 DIAGNOSIS — Z992 Dependence on renal dialysis: Secondary | ICD-10-CM | POA: Diagnosis not present

## 2020-09-13 DIAGNOSIS — E1129 Type 2 diabetes mellitus with other diabetic kidney complication: Secondary | ICD-10-CM | POA: Diagnosis not present

## 2020-09-13 DIAGNOSIS — N186 End stage renal disease: Secondary | ICD-10-CM | POA: Diagnosis not present

## 2020-09-13 DIAGNOSIS — D509 Iron deficiency anemia, unspecified: Secondary | ICD-10-CM | POA: Diagnosis not present

## 2020-09-15 DIAGNOSIS — D509 Iron deficiency anemia, unspecified: Secondary | ICD-10-CM | POA: Diagnosis not present

## 2020-09-15 DIAGNOSIS — E1129 Type 2 diabetes mellitus with other diabetic kidney complication: Secondary | ICD-10-CM | POA: Diagnosis not present

## 2020-09-15 DIAGNOSIS — D631 Anemia in chronic kidney disease: Secondary | ICD-10-CM | POA: Diagnosis not present

## 2020-09-15 DIAGNOSIS — N186 End stage renal disease: Secondary | ICD-10-CM | POA: Diagnosis not present

## 2020-09-15 DIAGNOSIS — Z992 Dependence on renal dialysis: Secondary | ICD-10-CM | POA: Diagnosis not present

## 2020-09-15 DIAGNOSIS — N2581 Secondary hyperparathyroidism of renal origin: Secondary | ICD-10-CM | POA: Diagnosis not present

## 2020-09-17 DIAGNOSIS — D509 Iron deficiency anemia, unspecified: Secondary | ICD-10-CM | POA: Diagnosis not present

## 2020-09-17 DIAGNOSIS — N186 End stage renal disease: Secondary | ICD-10-CM | POA: Diagnosis not present

## 2020-09-17 DIAGNOSIS — D631 Anemia in chronic kidney disease: Secondary | ICD-10-CM | POA: Diagnosis not present

## 2020-09-17 DIAGNOSIS — E1129 Type 2 diabetes mellitus with other diabetic kidney complication: Secondary | ICD-10-CM | POA: Diagnosis not present

## 2020-09-17 DIAGNOSIS — Z992 Dependence on renal dialysis: Secondary | ICD-10-CM | POA: Diagnosis not present

## 2020-09-17 DIAGNOSIS — N2581 Secondary hyperparathyroidism of renal origin: Secondary | ICD-10-CM | POA: Diagnosis not present

## 2020-09-19 DIAGNOSIS — D485 Neoplasm of uncertain behavior of skin: Secondary | ICD-10-CM | POA: Diagnosis not present

## 2020-09-19 DIAGNOSIS — L570 Actinic keratosis: Secondary | ICD-10-CM | POA: Diagnosis not present

## 2020-09-19 DIAGNOSIS — L280 Lichen simplex chronicus: Secondary | ICD-10-CM | POA: Diagnosis not present

## 2020-09-20 DIAGNOSIS — Z992 Dependence on renal dialysis: Secondary | ICD-10-CM | POA: Diagnosis not present

## 2020-09-20 DIAGNOSIS — D631 Anemia in chronic kidney disease: Secondary | ICD-10-CM | POA: Diagnosis not present

## 2020-09-20 DIAGNOSIS — N2581 Secondary hyperparathyroidism of renal origin: Secondary | ICD-10-CM | POA: Diagnosis not present

## 2020-09-20 DIAGNOSIS — D509 Iron deficiency anemia, unspecified: Secondary | ICD-10-CM | POA: Diagnosis not present

## 2020-09-20 DIAGNOSIS — N186 End stage renal disease: Secondary | ICD-10-CM | POA: Diagnosis not present

## 2020-09-20 DIAGNOSIS — E1129 Type 2 diabetes mellitus with other diabetic kidney complication: Secondary | ICD-10-CM | POA: Diagnosis not present

## 2020-09-21 ENCOUNTER — Other Ambulatory Visit: Payer: Self-pay | Admitting: Family Medicine

## 2020-09-21 DIAGNOSIS — Z1231 Encounter for screening mammogram for malignant neoplasm of breast: Secondary | ICD-10-CM

## 2020-09-22 DIAGNOSIS — Z992 Dependence on renal dialysis: Secondary | ICD-10-CM | POA: Diagnosis not present

## 2020-09-22 DIAGNOSIS — E1129 Type 2 diabetes mellitus with other diabetic kidney complication: Secondary | ICD-10-CM | POA: Diagnosis not present

## 2020-09-22 DIAGNOSIS — D509 Iron deficiency anemia, unspecified: Secondary | ICD-10-CM | POA: Diagnosis not present

## 2020-09-22 DIAGNOSIS — N2581 Secondary hyperparathyroidism of renal origin: Secondary | ICD-10-CM | POA: Diagnosis not present

## 2020-09-22 DIAGNOSIS — N186 End stage renal disease: Secondary | ICD-10-CM | POA: Diagnosis not present

## 2020-09-22 DIAGNOSIS — D631 Anemia in chronic kidney disease: Secondary | ICD-10-CM | POA: Diagnosis not present

## 2020-09-23 ENCOUNTER — Ambulatory Visit: Payer: Medicare Other | Admitting: Podiatry

## 2020-09-24 DIAGNOSIS — D631 Anemia in chronic kidney disease: Secondary | ICD-10-CM | POA: Diagnosis not present

## 2020-09-24 DIAGNOSIS — D509 Iron deficiency anemia, unspecified: Secondary | ICD-10-CM | POA: Diagnosis not present

## 2020-09-24 DIAGNOSIS — N186 End stage renal disease: Secondary | ICD-10-CM | POA: Diagnosis not present

## 2020-09-24 DIAGNOSIS — N2581 Secondary hyperparathyroidism of renal origin: Secondary | ICD-10-CM | POA: Diagnosis not present

## 2020-09-24 DIAGNOSIS — Z992 Dependence on renal dialysis: Secondary | ICD-10-CM | POA: Diagnosis not present

## 2020-09-24 DIAGNOSIS — E1129 Type 2 diabetes mellitus with other diabetic kidney complication: Secondary | ICD-10-CM | POA: Diagnosis not present

## 2020-09-27 DIAGNOSIS — N186 End stage renal disease: Secondary | ICD-10-CM | POA: Diagnosis not present

## 2020-09-27 DIAGNOSIS — D631 Anemia in chronic kidney disease: Secondary | ICD-10-CM | POA: Diagnosis not present

## 2020-09-27 DIAGNOSIS — Z992 Dependence on renal dialysis: Secondary | ICD-10-CM | POA: Diagnosis not present

## 2020-09-27 DIAGNOSIS — D509 Iron deficiency anemia, unspecified: Secondary | ICD-10-CM | POA: Diagnosis not present

## 2020-09-27 DIAGNOSIS — N2581 Secondary hyperparathyroidism of renal origin: Secondary | ICD-10-CM | POA: Diagnosis not present

## 2020-09-27 DIAGNOSIS — E1129 Type 2 diabetes mellitus with other diabetic kidney complication: Secondary | ICD-10-CM | POA: Diagnosis not present

## 2020-09-28 ENCOUNTER — Other Ambulatory Visit: Payer: Self-pay

## 2020-09-28 ENCOUNTER — Ambulatory Visit (INDEPENDENT_AMBULATORY_CARE_PROVIDER_SITE_OTHER): Payer: Medicare Other | Admitting: Podiatry

## 2020-09-28 ENCOUNTER — Other Ambulatory Visit: Payer: Self-pay | Admitting: Endocrinology

## 2020-09-28 DIAGNOSIS — B351 Tinea unguium: Secondary | ICD-10-CM

## 2020-09-28 DIAGNOSIS — L2084 Intrinsic (allergic) eczema: Secondary | ICD-10-CM | POA: Diagnosis not present

## 2020-09-28 DIAGNOSIS — E1151 Type 2 diabetes mellitus with diabetic peripheral angiopathy without gangrene: Secondary | ICD-10-CM | POA: Diagnosis not present

## 2020-09-28 DIAGNOSIS — M79674 Pain in right toe(s): Secondary | ICD-10-CM

## 2020-09-28 DIAGNOSIS — H61002 Unspecified perichondritis of left external ear: Secondary | ICD-10-CM | POA: Diagnosis not present

## 2020-09-28 DIAGNOSIS — M79675 Pain in left toe(s): Secondary | ICD-10-CM

## 2020-09-28 LAB — HM DIABETES FOOT EXAM: HM Diabetic Foot Exam: NORMAL

## 2020-09-29 ENCOUNTER — Encounter: Payer: Self-pay | Admitting: Podiatry

## 2020-09-29 DIAGNOSIS — Z992 Dependence on renal dialysis: Secondary | ICD-10-CM | POA: Diagnosis not present

## 2020-09-29 DIAGNOSIS — N2581 Secondary hyperparathyroidism of renal origin: Secondary | ICD-10-CM | POA: Diagnosis not present

## 2020-09-29 DIAGNOSIS — E1129 Type 2 diabetes mellitus with other diabetic kidney complication: Secondary | ICD-10-CM | POA: Diagnosis not present

## 2020-09-29 DIAGNOSIS — D631 Anemia in chronic kidney disease: Secondary | ICD-10-CM | POA: Diagnosis not present

## 2020-09-29 DIAGNOSIS — E1122 Type 2 diabetes mellitus with diabetic chronic kidney disease: Secondary | ICD-10-CM | POA: Diagnosis not present

## 2020-09-29 DIAGNOSIS — N186 End stage renal disease: Secondary | ICD-10-CM | POA: Diagnosis not present

## 2020-09-29 NOTE — Progress Notes (Signed)
  Subjective:  Patient ID: Julie Brewer, female    DOB: May 18, 1950,  MRN: [id number]  Chief Complaint  Patient presents with   Nail Problem    Nail trim    70 y.o. female returns for the above complaint.  Patient is a type II diabetic whose A1c is 6.1.  Patient presents with thickened elongated mycotic toenails x10.  Patient states that she is not able to cut it herself.  She would like to bring [place] to debride them down.  She denies any other acute complaints.  Objective:  There were no vitals filed for this visit. Podiatric Exam: Vascular: dorsalis pedis and posterior tibial pulses are palpable bilateral. Capillary return is immediate. Temperature gradient is WNL. Skin turgor WNL  Sensorium: Normal Semmes Weinstein monofilament test. Normal tactile sensation bilaterally. Nail Exam: Pt has thick disfigured discolored nails with subungual debris noted bilateral entire nail hallux through fifth toenails Ulcer Exam: There is no evidence of ulcer or pre-ulcerative changes or infection. Orthopedic Exam: Muscle tone and strength are WNL. No limitations in general ROM. No crepitus or effusions noted. HAV  B/L.  Hammer toes 2-5  B/L. Skin: No Porokeratosis. No infection or ulcers.  Submet 3 hyperkeratotic lesion/callus x2.  Pain on palpation.  Assessment & Plan:  Patient was evaluated and treated and all questions answered.  Hammertoe contractures bilateral two through five -I explained to patient the etiology of hammertoe contracture as well as treatment options were discussed. Given that patient has underlying diabetes with contractures of the hammertoes I believe patient will benefit from diabetic shoes to prevent ulceration leading to amputation. Patient agrees with the plan. -Patient is currently wearing diabetic shoes.  No acute complaints noted.  Bilateral submet 3 hyperkeratotic lesion/callus x2 -Resolving  Onychomycosis with pain  -Nails palliatively debrided as  below. -Educated on self-care  Procedure: Nail Debridement Rationale: pain  Type of Debridement: manual, sharp debridement. Instrumentation: Nail nipper, rotary burr. Number of Nails: 10  Procedures and Treatment: Consent by patient was obtained for treatment procedures. The patient understood the discussion of treatment and procedures well. All questions were answered thoroughly reviewed. Debridement of mycotic and hypertrophic toenails, 1 through 5 bilateral and clearing of subungual debris. No ulceration, no infection noted.  Return Visit-Office Procedure: Patient instructed to return to the office for a follow up visit 3 months for continued evaluation and treatment.  Boneta Lucks, DPM    No follow-ups on file.

## 2020-10-01 DIAGNOSIS — E1129 Type 2 diabetes mellitus with other diabetic kidney complication: Secondary | ICD-10-CM | POA: Diagnosis not present

## 2020-10-01 DIAGNOSIS — N2581 Secondary hyperparathyroidism of renal origin: Secondary | ICD-10-CM | POA: Diagnosis not present

## 2020-10-01 DIAGNOSIS — Z992 Dependence on renal dialysis: Secondary | ICD-10-CM | POA: Diagnosis not present

## 2020-10-01 DIAGNOSIS — D631 Anemia in chronic kidney disease: Secondary | ICD-10-CM | POA: Diagnosis not present

## 2020-10-01 DIAGNOSIS — N186 End stage renal disease: Secondary | ICD-10-CM | POA: Diagnosis not present

## 2020-10-04 ENCOUNTER — Other Ambulatory Visit: Payer: Self-pay | Admitting: Endocrinology

## 2020-10-04 DIAGNOSIS — N186 End stage renal disease: Secondary | ICD-10-CM | POA: Diagnosis not present

## 2020-10-04 DIAGNOSIS — N2581 Secondary hyperparathyroidism of renal origin: Secondary | ICD-10-CM | POA: Diagnosis not present

## 2020-10-04 DIAGNOSIS — D631 Anemia in chronic kidney disease: Secondary | ICD-10-CM | POA: Diagnosis not present

## 2020-10-04 DIAGNOSIS — Z992 Dependence on renal dialysis: Secondary | ICD-10-CM | POA: Diagnosis not present

## 2020-10-04 DIAGNOSIS — E1129 Type 2 diabetes mellitus with other diabetic kidney complication: Secondary | ICD-10-CM | POA: Diagnosis not present

## 2020-10-06 DIAGNOSIS — E1129 Type 2 diabetes mellitus with other diabetic kidney complication: Secondary | ICD-10-CM | POA: Diagnosis not present

## 2020-10-06 DIAGNOSIS — N186 End stage renal disease: Secondary | ICD-10-CM | POA: Diagnosis not present

## 2020-10-06 DIAGNOSIS — D631 Anemia in chronic kidney disease: Secondary | ICD-10-CM | POA: Diagnosis not present

## 2020-10-06 DIAGNOSIS — N2581 Secondary hyperparathyroidism of renal origin: Secondary | ICD-10-CM | POA: Diagnosis not present

## 2020-10-06 DIAGNOSIS — Z992 Dependence on renal dialysis: Secondary | ICD-10-CM | POA: Diagnosis not present

## 2020-10-07 ENCOUNTER — Other Ambulatory Visit: Payer: Self-pay | Admitting: Vascular Surgery

## 2020-10-10 NOTE — Telephone Encounter (Signed)
Please call and schedule appt for more refills

## 2020-10-11 DIAGNOSIS — D631 Anemia in chronic kidney disease: Secondary | ICD-10-CM | POA: Diagnosis not present

## 2020-10-11 DIAGNOSIS — E1129 Type 2 diabetes mellitus with other diabetic kidney complication: Secondary | ICD-10-CM | POA: Diagnosis not present

## 2020-10-11 DIAGNOSIS — N2581 Secondary hyperparathyroidism of renal origin: Secondary | ICD-10-CM | POA: Diagnosis not present

## 2020-10-11 DIAGNOSIS — Z992 Dependence on renal dialysis: Secondary | ICD-10-CM | POA: Diagnosis not present

## 2020-10-11 DIAGNOSIS — N186 End stage renal disease: Secondary | ICD-10-CM | POA: Diagnosis not present

## 2020-10-13 DIAGNOSIS — E1129 Type 2 diabetes mellitus with other diabetic kidney complication: Secondary | ICD-10-CM | POA: Diagnosis not present

## 2020-10-13 DIAGNOSIS — N2581 Secondary hyperparathyroidism of renal origin: Secondary | ICD-10-CM | POA: Diagnosis not present

## 2020-10-13 DIAGNOSIS — Z992 Dependence on renal dialysis: Secondary | ICD-10-CM | POA: Diagnosis not present

## 2020-10-13 DIAGNOSIS — N186 End stage renal disease: Secondary | ICD-10-CM | POA: Diagnosis not present

## 2020-10-13 DIAGNOSIS — D631 Anemia in chronic kidney disease: Secondary | ICD-10-CM | POA: Diagnosis not present

## 2020-10-14 ENCOUNTER — Other Ambulatory Visit: Payer: Self-pay | Admitting: Endocrinology

## 2020-10-18 DIAGNOSIS — N2581 Secondary hyperparathyroidism of renal origin: Secondary | ICD-10-CM | POA: Diagnosis not present

## 2020-10-18 DIAGNOSIS — N186 End stage renal disease: Secondary | ICD-10-CM | POA: Diagnosis not present

## 2020-10-18 DIAGNOSIS — E1129 Type 2 diabetes mellitus with other diabetic kidney complication: Secondary | ICD-10-CM | POA: Diagnosis not present

## 2020-10-18 DIAGNOSIS — D631 Anemia in chronic kidney disease: Secondary | ICD-10-CM | POA: Diagnosis not present

## 2020-10-18 DIAGNOSIS — Z992 Dependence on renal dialysis: Secondary | ICD-10-CM | POA: Diagnosis not present

## 2020-10-19 DIAGNOSIS — Z23 Encounter for immunization: Secondary | ICD-10-CM | POA: Diagnosis not present

## 2020-10-20 DIAGNOSIS — D631 Anemia in chronic kidney disease: Secondary | ICD-10-CM | POA: Diagnosis not present

## 2020-10-20 DIAGNOSIS — Z992 Dependence on renal dialysis: Secondary | ICD-10-CM | POA: Diagnosis not present

## 2020-10-20 DIAGNOSIS — N186 End stage renal disease: Secondary | ICD-10-CM | POA: Diagnosis not present

## 2020-10-20 DIAGNOSIS — E1129 Type 2 diabetes mellitus with other diabetic kidney complication: Secondary | ICD-10-CM | POA: Diagnosis not present

## 2020-10-20 DIAGNOSIS — N2581 Secondary hyperparathyroidism of renal origin: Secondary | ICD-10-CM | POA: Diagnosis not present

## 2020-10-22 DIAGNOSIS — N186 End stage renal disease: Secondary | ICD-10-CM | POA: Diagnosis not present

## 2020-10-22 DIAGNOSIS — Z992 Dependence on renal dialysis: Secondary | ICD-10-CM | POA: Diagnosis not present

## 2020-10-22 DIAGNOSIS — N2581 Secondary hyperparathyroidism of renal origin: Secondary | ICD-10-CM | POA: Diagnosis not present

## 2020-10-22 DIAGNOSIS — D631 Anemia in chronic kidney disease: Secondary | ICD-10-CM | POA: Diagnosis not present

## 2020-10-22 DIAGNOSIS — E1129 Type 2 diabetes mellitus with other diabetic kidney complication: Secondary | ICD-10-CM | POA: Diagnosis not present

## 2020-10-25 DIAGNOSIS — N186 End stage renal disease: Secondary | ICD-10-CM | POA: Diagnosis not present

## 2020-10-25 DIAGNOSIS — Z992 Dependence on renal dialysis: Secondary | ICD-10-CM | POA: Diagnosis not present

## 2020-10-25 DIAGNOSIS — N2581 Secondary hyperparathyroidism of renal origin: Secondary | ICD-10-CM | POA: Diagnosis not present

## 2020-10-25 DIAGNOSIS — E1129 Type 2 diabetes mellitus with other diabetic kidney complication: Secondary | ICD-10-CM | POA: Diagnosis not present

## 2020-10-25 DIAGNOSIS — D631 Anemia in chronic kidney disease: Secondary | ICD-10-CM | POA: Diagnosis not present

## 2020-10-26 ENCOUNTER — Other Ambulatory Visit: Payer: Self-pay

## 2020-10-26 ENCOUNTER — Other Ambulatory Visit: Payer: Self-pay | Admitting: Vascular Surgery

## 2020-10-26 DIAGNOSIS — I779 Disorder of arteries and arterioles, unspecified: Secondary | ICD-10-CM

## 2020-10-27 DIAGNOSIS — N2581 Secondary hyperparathyroidism of renal origin: Secondary | ICD-10-CM | POA: Diagnosis not present

## 2020-10-27 DIAGNOSIS — Z992 Dependence on renal dialysis: Secondary | ICD-10-CM | POA: Diagnosis not present

## 2020-10-27 DIAGNOSIS — D631 Anemia in chronic kidney disease: Secondary | ICD-10-CM | POA: Diagnosis not present

## 2020-10-27 DIAGNOSIS — E1129 Type 2 diabetes mellitus with other diabetic kidney complication: Secondary | ICD-10-CM | POA: Diagnosis not present

## 2020-10-27 DIAGNOSIS — N186 End stage renal disease: Secondary | ICD-10-CM | POA: Diagnosis not present

## 2020-10-29 DIAGNOSIS — D631 Anemia in chronic kidney disease: Secondary | ICD-10-CM | POA: Diagnosis not present

## 2020-10-29 DIAGNOSIS — Z992 Dependence on renal dialysis: Secondary | ICD-10-CM | POA: Diagnosis not present

## 2020-10-29 DIAGNOSIS — E1129 Type 2 diabetes mellitus with other diabetic kidney complication: Secondary | ICD-10-CM | POA: Diagnosis not present

## 2020-10-29 DIAGNOSIS — E1122 Type 2 diabetes mellitus with diabetic chronic kidney disease: Secondary | ICD-10-CM | POA: Diagnosis not present

## 2020-10-29 DIAGNOSIS — N186 End stage renal disease: Secondary | ICD-10-CM | POA: Diagnosis not present

## 2020-10-29 DIAGNOSIS — N2581 Secondary hyperparathyroidism of renal origin: Secondary | ICD-10-CM | POA: Diagnosis not present

## 2020-11-01 DIAGNOSIS — N186 End stage renal disease: Secondary | ICD-10-CM | POA: Diagnosis not present

## 2020-11-01 DIAGNOSIS — N2581 Secondary hyperparathyroidism of renal origin: Secondary | ICD-10-CM | POA: Diagnosis not present

## 2020-11-01 DIAGNOSIS — E1129 Type 2 diabetes mellitus with other diabetic kidney complication: Secondary | ICD-10-CM | POA: Diagnosis not present

## 2020-11-01 DIAGNOSIS — Z992 Dependence on renal dialysis: Secondary | ICD-10-CM | POA: Diagnosis not present

## 2020-11-01 DIAGNOSIS — D631 Anemia in chronic kidney disease: Secondary | ICD-10-CM | POA: Diagnosis not present

## 2020-11-03 DIAGNOSIS — N186 End stage renal disease: Secondary | ICD-10-CM | POA: Diagnosis not present

## 2020-11-03 DIAGNOSIS — Z992 Dependence on renal dialysis: Secondary | ICD-10-CM | POA: Diagnosis not present

## 2020-11-03 DIAGNOSIS — D631 Anemia in chronic kidney disease: Secondary | ICD-10-CM | POA: Diagnosis not present

## 2020-11-03 DIAGNOSIS — E1129 Type 2 diabetes mellitus with other diabetic kidney complication: Secondary | ICD-10-CM | POA: Diagnosis not present

## 2020-11-03 DIAGNOSIS — N2581 Secondary hyperparathyroidism of renal origin: Secondary | ICD-10-CM | POA: Diagnosis not present

## 2020-11-04 ENCOUNTER — Ambulatory Visit
Admission: RE | Admit: 2020-11-04 | Discharge: 2020-11-04 | Disposition: A | Discharge status: Home / Self Care | Payer: Medicare Other | Source: Ambulatory Visit | Attending: Family Medicine | Admitting: Family Medicine

## 2020-11-04 ENCOUNTER — Other Ambulatory Visit: Payer: Self-pay

## 2020-11-04 DIAGNOSIS — Z1231 Encounter for screening mammogram for malignant neoplasm of breast: Secondary | ICD-10-CM

## 2020-11-05 DIAGNOSIS — E1129 Type 2 diabetes mellitus with other diabetic kidney complication: Secondary | ICD-10-CM | POA: Diagnosis not present

## 2020-11-05 DIAGNOSIS — N2581 Secondary hyperparathyroidism of renal origin: Secondary | ICD-10-CM | POA: Diagnosis not present

## 2020-11-05 DIAGNOSIS — Z992 Dependence on renal dialysis: Secondary | ICD-10-CM | POA: Diagnosis not present

## 2020-11-05 DIAGNOSIS — D631 Anemia in chronic kidney disease: Secondary | ICD-10-CM | POA: Diagnosis not present

## 2020-11-05 DIAGNOSIS — N186 End stage renal disease: Secondary | ICD-10-CM | POA: Diagnosis not present

## 2020-11-08 DIAGNOSIS — N2581 Secondary hyperparathyroidism of renal origin: Secondary | ICD-10-CM | POA: Diagnosis not present

## 2020-11-08 DIAGNOSIS — E1129 Type 2 diabetes mellitus with other diabetic kidney complication: Secondary | ICD-10-CM | POA: Diagnosis not present

## 2020-11-08 DIAGNOSIS — Z992 Dependence on renal dialysis: Secondary | ICD-10-CM | POA: Diagnosis not present

## 2020-11-08 DIAGNOSIS — D631 Anemia in chronic kidney disease: Secondary | ICD-10-CM | POA: Diagnosis not present

## 2020-11-08 DIAGNOSIS — N186 End stage renal disease: Secondary | ICD-10-CM | POA: Diagnosis not present

## 2020-11-10 DIAGNOSIS — E1129 Type 2 diabetes mellitus with other diabetic kidney complication: Secondary | ICD-10-CM | POA: Diagnosis not present

## 2020-11-10 DIAGNOSIS — N186 End stage renal disease: Secondary | ICD-10-CM | POA: Diagnosis not present

## 2020-11-10 DIAGNOSIS — D631 Anemia in chronic kidney disease: Secondary | ICD-10-CM | POA: Diagnosis not present

## 2020-11-10 DIAGNOSIS — Z992 Dependence on renal dialysis: Secondary | ICD-10-CM | POA: Diagnosis not present

## 2020-11-10 DIAGNOSIS — N2581 Secondary hyperparathyroidism of renal origin: Secondary | ICD-10-CM | POA: Diagnosis not present

## 2020-11-11 ENCOUNTER — Other Ambulatory Visit: Payer: Self-pay

## 2020-11-11 ENCOUNTER — Other Ambulatory Visit (INDEPENDENT_AMBULATORY_CARE_PROVIDER_SITE_OTHER): Payer: Medicare Other

## 2020-11-11 DIAGNOSIS — Z794 Long term (current) use of insulin: Secondary | ICD-10-CM

## 2020-11-11 DIAGNOSIS — E1165 Type 2 diabetes mellitus with hyperglycemia: Secondary | ICD-10-CM

## 2020-11-11 LAB — GLUCOSE, RANDOM: Glucose, Bld: 185 mg/dL — ABNORMAL HIGH (ref 70–99)

## 2020-11-11 LAB — HEMOGLOBIN A1C: Hgb A1c MFr Bld: 5.4 % (ref 4.6–6.5)

## 2020-11-12 DIAGNOSIS — D631 Anemia in chronic kidney disease: Secondary | ICD-10-CM | POA: Diagnosis not present

## 2020-11-12 DIAGNOSIS — N186 End stage renal disease: Secondary | ICD-10-CM | POA: Diagnosis not present

## 2020-11-12 DIAGNOSIS — Z992 Dependence on renal dialysis: Secondary | ICD-10-CM | POA: Diagnosis not present

## 2020-11-12 DIAGNOSIS — E1129 Type 2 diabetes mellitus with other diabetic kidney complication: Secondary | ICD-10-CM | POA: Diagnosis not present

## 2020-11-12 DIAGNOSIS — N2581 Secondary hyperparathyroidism of renal origin: Secondary | ICD-10-CM | POA: Diagnosis not present

## 2020-11-14 ENCOUNTER — Encounter: Payer: Self-pay | Admitting: Endocrinology

## 2020-11-14 ENCOUNTER — Other Ambulatory Visit: Payer: Self-pay

## 2020-11-14 ENCOUNTER — Ambulatory Visit (INDEPENDENT_AMBULATORY_CARE_PROVIDER_SITE_OTHER): Payer: Medicare Other | Admitting: Endocrinology

## 2020-11-14 VITALS — BP 146/70 | HR 93 | Ht 63.0 in | Wt 134.2 lb

## 2020-11-14 DIAGNOSIS — E1165 Type 2 diabetes mellitus with hyperglycemia: Secondary | ICD-10-CM | POA: Diagnosis not present

## 2020-11-14 DIAGNOSIS — N186 End stage renal disease: Secondary | ICD-10-CM | POA: Diagnosis not present

## 2020-11-14 DIAGNOSIS — E1151 Type 2 diabetes mellitus with diabetic peripheral angiopathy without gangrene: Secondary | ICD-10-CM | POA: Diagnosis not present

## 2020-11-14 DIAGNOSIS — Z794 Long term (current) use of insulin: Secondary | ICD-10-CM

## 2020-11-14 DIAGNOSIS — I779 Disorder of arteries and arterioles, unspecified: Secondary | ICD-10-CM | POA: Diagnosis not present

## 2020-11-14 DIAGNOSIS — H540X55 Blindness right eye category 5, blindness left eye category 5: Secondary | ICD-10-CM

## 2020-11-14 DIAGNOSIS — Z992 Dependence on renal dialysis: Secondary | ICD-10-CM

## 2020-11-14 LAB — POCT GLUCOSE (DEVICE FOR HOME USE): POC Glucose: 237 mg/dl — AB (ref 70–99)

## 2020-11-14 NOTE — Progress Notes (Signed)
Patient ID: Julie Brewer, female   DOB: 12-28-1950, 70 y.o.   MRN: [id number]   Reason for Appointment: Follow-up of various problems  History of Present Illness    Diagnosis: Type 2 DIABETES MELITUS, date of diagnosis:  1985  Prior history: She has been on insulin since diagnosis and on Lantus previously Also at some point had been started on Glucophage several years ago also Because of insurance preference Lantus was changed to Levemir  She refuses to use analog rapid acting insulin because of cost and is using regular insulin for several years   Her blood sugars are generally well controlled and A1c usually under 7% Her A1c previously was higher with stopping metformin at 8%  Recent history:   Insulin regimen: Lantus insulin 14 units in the morning daily.  Regular insulin 20-30 minutes Before eating, 10 units a.m. and 10 ac supper  Oral hypoglycemic drugs: None   Current blood sugar patterns, management and problems:  Her A1c is much better at 5.4 and progressively improved She also has chronic anemia   She is not able to afford any brand-name insulin and is buying regular insulin OTC from [facility]  She usually tries to take her insulin 30 minutes before eating In the morning she is usually eating oatmeal and toast with sometimes a meat with variable response after eating on her blood sugars, they may be either low normal or like today they were over 200 Blood sugar was relatively low compared to the lab glucose on her sensor at the same time but only slightly lower in the office today Although Lantus was reduced by 2 units her overnight blood sugars are periodically significantly low possibly more than blood sugars are lower at bedtime  Her daughter is drawing up her insulin with the syringe No hypoglycemic symptoms, she has hypoglycemia unawareness despite some readings as low as 40 on her sensor   Side effects from medications: None      Glucometer:   FreeStyle libre  Interpretation of the [employer] as follows  Over the last 2 weeks her blood sugars on an average are low normal or low overnight with variably high readings after breakfast and dinner Data is generally incomplete after about 7:30 PM until 11:30 PM  Although her overnight blood sugars are periodically low this has happened for a long period of time on 4 Nights on her database Frequency of monitoring is 1-4 times a day  but her data is consistently not available on some days especially after 7 PM She has significant rise in blood sugar after breakfast noted on about 4 of the days and not consistent Blood sugars after dinner are not completely available and only 1 significantly high reading but may be low normal at bedtime on a couple of nights   CGM use % of time 48  2-week average/GV 104/47  Time in range     65   %  % Time Above 180 8+1  % Time above 250   % Time Below 70 26     PRE-MEAL Fasting Lunch Dinner 4-6 AM Overall  Glucose range:       Averages: 72  109  67 104   POST-MEAL PC Breakfast PC Lunch PC Dinner  Glucose range:     Averages: 151  135    Prior  CGM use % of time 44  2-week average/GV 108/38  Time in range    78%  % Time Above 180 9  %  Time above 250   % Time Below 70 13     PRE-MEAL Fasting Lunch Dinner 4-6 AM Overall  Glucose range:       Averages: 86  105 78    POST-MEAL PC Breakfast PC Lunch PC Dinner  Glucose range:     Averages: 126  175      Meals:  usually 2 meals per day at 10 AM and 5 PM.     Mealtime protein sources:turkey, chicken.  Eating cereal for breakfast Or oatmeal Avoiding sweet drinks  Physical activity: exercise: Some walking within the house              Wt Readings from Last 3 Encounters:  11/14/20 134 lb 3.2 oz (60.9 kg)  08/17/20 135 lb 3.2 oz (61.3 kg)  07/15/20 136 lb (61.7 kg)   Lab Results  Component Value Date   HGBA1C 5.4 11/11/2020   HGBA1C 6.3 08/10/2020   HGBA1C 7.7 (H)  02/03/2020   Lab Results  Component Value Date   MICROALBUR 196.7 (H) 03/11/2018   LDLCALC 71 08/10/2020   CREATININE 2.44 (H) 06/25/2019      Lab Results  Component Value Date   FRUCTOSAMINE 275 05/18/2020   FRUCTOSAMINE 368 (H) 02/03/2020   FRUCTOSAMINE 293 (H) 09/28/2019   FRUCTOSAMINE 218 03/11/2018    Other active problems: See review of systems      Allergies as of 11/14/2020       Reactions   Morphine And Related Other (See Comments)   Hallucinations    Penicillins Swelling, Rash   Throat swelling Did it involve swelling of the face/tongue/throat, SOB, or low BP? Yes Did it involve sudden or severe rash/hives, skin peeling, or any reaction on the inside of your mouth or nose? Yes Did you need to seek medical attention at a hospital or doctor's office? Yes When did it last happen?   young child    If all above answers are "NO", may proceed with cephalosporin use.        Medication List        Accurate as of November 14, 2020  1:47 PM. If you have any questions, ask your nurse or doctor.          acetaminophen 325 MG tablet Commonly known as: TYLENOL Take 2 tablets (650 mg total) by mouth every 6 (six) hours as needed for mild pain (or Fever >/= 101).   amLODipine 10 MG tablet Commonly known as: NORVASC SMARTSIG:.5 Tablet(s) By Mouth Daily   aspirin 325 MG EC tablet Take 325 mg by mouth daily.   atorvastatin 20 MG tablet Commonly known as: LIPITOR Take 1 tablet by mouth once daily   bisacodyl 5 MG EC tablet Commonly known as: DULCOLAX Take 5 mg by mouth daily as needed for moderate constipation.   cilostazol 100 MG tablet Commonly known as: PLETAL Take 1 tablet by mouth twice daily   docusate sodium 100 MG capsule Commonly known as: COLACE Take 200 mg by mouth daily.   ferrous sulfate 325 (65 FE) MG tablet Take 650 mg by mouth daily.   FreeStyle Freedom Kit Use to check blood sugar once a day dx code   FREESTYLE LITE test  strip Generic drug: glucose blood USE AS INSTRUCTED TO CHECK BLOOD SUGAR ONCE DAILY.   gabapentin 100 MG capsule Commonly known as: NEURONTIN TAKE 1 CAPSULE BY MOUTH TWICE DAILY AS NEEDED   insulin glargine 100 UNIT/ML injection Commonly known as: LANTUS Lantus insulin 16 U  in the morning daily.   Insulin Pen Needle 31G X 5 MM Misc Use with pen   insulin regular 100 units/mL injection Commonly known as: NOVOLIN R Inject into the skin 2 (two) times daily before a meal. Inject 10 units under the skin before breakfast and 5-7 units before dinner.   lamoTRIgine 100 MG tablet Commonly known as: LaMICtal Take 1 tablet (100 mg total) by mouth 2 (two) times daily.   lidocaine-prilocaine cream Commonly known as: EMLA SMARTSIG:Sparingly Topical   omeprazole 20 MG capsule Commonly known as: PRILOSEC Take 1 capsule by mouth once daily   Vitamin D (Ergocalciferol) 1.25 MG (50000 UNIT) Caps capsule Commonly known as: DRISDOL Take 1 capsule by mouth once a week        Allergies:  Allergies  Allergen Reactions   Morphine And Related Other (See Comments)    Hallucinations    Penicillins Swelling and Rash    Throat swelling Did it involve swelling of the face/tongue/throat, SOB, or low BP? Yes Did it involve sudden or severe rash/hives, skin peeling, or any reaction on the inside of your mouth or nose? Yes Did you need to seek medical attention at a hospital or doctor's office? Yes When did it last happen?   young child    If all above answers are "NO", may proceed with cephalosporin use.    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   CHF (congestive heart failure) (HCC)    Chronic kidney disease    Tu/Th/Sa   Diabetes mellitus    Type II per Dr [name]  (patient said type I)   Fibroid    GERD (gastroesophageal reflux disease)    Glaucoma     Hyperlipidemia    Hypertension    Iron deficiency anemia 03/09/2016   Peripheral vascular disease ([place])    Pneumonia 2006   PONV (postoperative nausea and vomiting)    Shortness of breath dyspnea    with exdrtion, "Walkling too fast"   Stroke [facility])    no residual    Past Surgical History:  Procedure Laterality Date   ABDOMINAL HYSTERECTOMY     AV FISTULA PLACEMENT Left 04/27/2019   Procedure: INSERTION OF ARTERIOVENOUS (AV) GORE-TEX GRAFT LEFT UPPER ARM;  Surgeon: Angelia Mould, MD;  Location: [place];  Service: Vascular;  Laterality: Left;   BIOPSY  06/10/2017   Procedure: BIOPSY;  Surgeon: Ronnette Juniper, MD;  Location: [place];  Service: Gastroenterology;;   BREAST BIOPSY Right    CERVICAL FUSION     with graft from hip   COLONOSCOPY  July 09, 2012   DIRECT LARYNGOSCOPY N/A 06/07/2014   Procedure: DIRECT LARYNGOSCOPY with BIOPSY and EXCISION VOLLECULAR CYST;  Surgeon: Ruby Cola, MD;  Location: [place];  Service: ENT;  Laterality: N/A;   ESOPHAGOGASTRODUODENOSCOPY (EGD) WITH PROPOFOL Left 06/10/2017   Procedure: ESOPHAGOGASTRODUODENOSCOPY (EGD) WITH PROPOFOL;  Surgeon: Ronnette Juniper, MD;  Location: [place];  Service: Gastroenterology;  Laterality: Left;   FLEXIBLE SIGMOIDOSCOPY Left 06/10/2017   Procedure: FLEXIBLE SIGMOIDOSCOPY;  Surgeon: Ronnette Juniper, MD;  Location: [place];  Service: Gastroenterology;  Laterality: Left;   LOOP RECORDER INSERTION N/A 06/20/2016   Procedure: Loop Recorder Insertion;  Surgeon: [name], MD;  Location: [place] [facility];  Service: Cardiovascular;  Laterality: N/A;   REFRACTIVE SURGERY Bilateral    both eyes  SPINE SURGERY     lumbar    Family History  Problem Relation Age of Onset   Cancer Mother    Heart disease Mother    Diabetes Father    Cancer Brother    Cancer Brother    Throat cancer Brother     Social History:  reports that she has been smoking cigarettes. She has a 30.00 pack-year smoking history. She has  never used smokeless tobacco. She reports that she does not currently use alcohol. She reports that she does not currently use drugs after having used the following drugs: Methylphenidate.  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     HYPERLIPIDEMIA: The lipid abnormality consists of elevated LDL controlled with Lipitor 20 mg .   Her baseline LDL was 202  She is taking this regularly with good results .  Lab Results  Component Value Date   CHOL 157 08/10/2020   HDL 47.60 08/10/2020   LDLCALC 71 08/10/2020   TRIG 192.0 (H) 08/10/2020   CHOLHDL 3 08/10/2020       Last diabetic foot exam was done in 8/22 by podiatrist She has some vascular disease on the left side followed by vascular specialist  Has been taking gabapentin 168m twice daily for lower leg leg pains with relief   She has had her Covid vaccines   Examination:   BP (!) 146/70   Pulse 93   Ht 5' 3"  (1.6 m)   Wt 134 lb 3.2 oz (60.9 kg)   SpO2 97%   BMI 23.77 kg/m   Body mass index is 23.77 kg/m.    ASSESSMENT/ PLAN:     Diabetes type 2 on insulin See history of present illness for detailed discussion of current diabetes management, blood sugar patterns and problems identified  Her A1c is lower at 5.4  She is on basal bolus insulin with Lantus and regular insulin  Although her freestyle lElenor Legatois variably accurate which appears to be reading somewhat low at times Not clear if her blood sugars are truly low when she has readings as low as 40 overnight Has hypoglycemia unawareness  May be getting some low sugars overnight because of long duration of action of her regular insulin However she is not able to afford brand-name  insulin  HYPERLIPIDEMIA: Will need follow-up lipid  Peripheral neuropathy: Symptoms are controlled  Recommendations: Reminded her to check more readings after supper at night specially bedtime She will have a snack at bedtime if blood sugars are relatively low If able to do fingersticks to compare she can do so especially if sugars are low on the freestyle libre Try to check blood sugars consistently and may even need to take her meter to dialysis Compared libre to fingersticks today and she needs to do this at home periodically also Reduce carbs and avoid bread with her oatmeal in the morning and add more protein consistently, given examples For now we will reduce Lantus to 10 units instead of 14 Reduce suppertime regular insulin to 8 units She will stay on the same dose at breakfast but adjust her meal  Continue gabapentin unchanged    Patient Instructions  Reduce supper dose to 8  units and stay on 10 in am  Lantus 10 units and call if am sugar >150  No bread but more protein in am  Julie Brewer 11/14/2020, 1:47 PM     Note: This office note was prepared with Dragon voice recognition system technology. Any transcriptional errors that result from this process are unintentional.

## 2020-11-14 NOTE — Patient Instructions (Addendum)
Reduce supper dose to 8 units and stay on 10 in am  Lantus 10 units and call if am sugar >150  No bread but more protein in am

## 2020-11-15 DIAGNOSIS — Z992 Dependence on renal dialysis: Secondary | ICD-10-CM | POA: Diagnosis not present

## 2020-11-15 DIAGNOSIS — N186 End stage renal disease: Secondary | ICD-10-CM | POA: Diagnosis not present

## 2020-11-15 DIAGNOSIS — D631 Anemia in chronic kidney disease: Secondary | ICD-10-CM | POA: Diagnosis not present

## 2020-11-15 DIAGNOSIS — E1129 Type 2 diabetes mellitus with other diabetic kidney complication: Secondary | ICD-10-CM | POA: Diagnosis not present

## 2020-11-15 DIAGNOSIS — N2581 Secondary hyperparathyroidism of renal origin: Secondary | ICD-10-CM | POA: Diagnosis not present

## 2020-11-17 DIAGNOSIS — Z992 Dependence on renal dialysis: Secondary | ICD-10-CM | POA: Diagnosis not present

## 2020-11-17 DIAGNOSIS — N186 End stage renal disease: Secondary | ICD-10-CM | POA: Diagnosis not present

## 2020-11-17 DIAGNOSIS — D631 Anemia in chronic kidney disease: Secondary | ICD-10-CM | POA: Diagnosis not present

## 2020-11-17 DIAGNOSIS — N2581 Secondary hyperparathyroidism of renal origin: Secondary | ICD-10-CM | POA: Diagnosis not present

## 2020-11-17 DIAGNOSIS — E1129 Type 2 diabetes mellitus with other diabetic kidney complication: Secondary | ICD-10-CM | POA: Diagnosis not present

## 2020-11-18 ENCOUNTER — Ambulatory Visit: Payer: Medicare Other | Admitting: Neurology

## 2020-11-19 DIAGNOSIS — E1129 Type 2 diabetes mellitus with other diabetic kidney complication: Secondary | ICD-10-CM | POA: Diagnosis not present

## 2020-11-19 DIAGNOSIS — N186 End stage renal disease: Secondary | ICD-10-CM | POA: Diagnosis not present

## 2020-11-19 DIAGNOSIS — N2581 Secondary hyperparathyroidism of renal origin: Secondary | ICD-10-CM | POA: Diagnosis not present

## 2020-11-19 DIAGNOSIS — Z992 Dependence on renal dialysis: Secondary | ICD-10-CM | POA: Diagnosis not present

## 2020-11-19 DIAGNOSIS — D631 Anemia in chronic kidney disease: Secondary | ICD-10-CM | POA: Diagnosis not present

## 2020-11-22 DIAGNOSIS — D631 Anemia in chronic kidney disease: Secondary | ICD-10-CM | POA: Diagnosis not present

## 2020-11-22 DIAGNOSIS — N186 End stage renal disease: Secondary | ICD-10-CM | POA: Diagnosis not present

## 2020-11-22 DIAGNOSIS — N2581 Secondary hyperparathyroidism of renal origin: Secondary | ICD-10-CM | POA: Diagnosis not present

## 2020-11-22 DIAGNOSIS — E1129 Type 2 diabetes mellitus with other diabetic kidney complication: Secondary | ICD-10-CM | POA: Diagnosis not present

## 2020-11-22 DIAGNOSIS — Z992 Dependence on renal dialysis: Secondary | ICD-10-CM | POA: Diagnosis not present

## 2020-11-24 DIAGNOSIS — Z992 Dependence on renal dialysis: Secondary | ICD-10-CM | POA: Diagnosis not present

## 2020-11-24 DIAGNOSIS — N186 End stage renal disease: Secondary | ICD-10-CM | POA: Diagnosis not present

## 2020-11-24 DIAGNOSIS — E1129 Type 2 diabetes mellitus with other diabetic kidney complication: Secondary | ICD-10-CM | POA: Diagnosis not present

## 2020-11-24 DIAGNOSIS — N2581 Secondary hyperparathyroidism of renal origin: Secondary | ICD-10-CM | POA: Diagnosis not present

## 2020-11-24 DIAGNOSIS — D631 Anemia in chronic kidney disease: Secondary | ICD-10-CM | POA: Diagnosis not present

## 2020-11-26 DIAGNOSIS — Z992 Dependence on renal dialysis: Secondary | ICD-10-CM | POA: Diagnosis not present

## 2020-11-26 DIAGNOSIS — N186 End stage renal disease: Secondary | ICD-10-CM | POA: Diagnosis not present

## 2020-11-26 DIAGNOSIS — N2581 Secondary hyperparathyroidism of renal origin: Secondary | ICD-10-CM | POA: Diagnosis not present

## 2020-11-26 DIAGNOSIS — E1129 Type 2 diabetes mellitus with other diabetic kidney complication: Secondary | ICD-10-CM | POA: Diagnosis not present

## 2020-11-26 DIAGNOSIS — D631 Anemia in chronic kidney disease: Secondary | ICD-10-CM | POA: Diagnosis not present

## 2020-11-28 ENCOUNTER — Ambulatory Visit (HOSPITAL_COMMUNITY)
Admission: RE | Admit: 2020-11-28 | Discharge: 2020-11-28 | Disposition: A | Discharge status: Home / Self Care | Payer: Medicare Other | Source: Ambulatory Visit | Attending: Surgery | Admitting: Surgery

## 2020-11-28 ENCOUNTER — Ambulatory Visit (INDEPENDENT_AMBULATORY_CARE_PROVIDER_SITE_OTHER): Payer: Medicare Other | Admitting: Physician Assistant

## 2020-11-28 ENCOUNTER — Other Ambulatory Visit: Payer: Self-pay

## 2020-11-28 VITALS — BP 151/76 | HR 84 | Temp 97.5°F | Resp 20 | Ht 63.0 in | Wt 137.9 lb

## 2020-11-28 DIAGNOSIS — I70212 Atherosclerosis of native arteries of extremities with intermittent claudication, left leg: Secondary | ICD-10-CM | POA: Diagnosis not present

## 2020-11-28 DIAGNOSIS — I779 Disorder of arteries and arterioles, unspecified: Secondary | ICD-10-CM | POA: Diagnosis not present

## 2020-11-28 NOTE — Progress Notes (Signed)
[facility] [place] HISTORY AND PHYSICAL   History of Present Illness:  Patient is a 70 y.o. year old female who presents for evaluation of claudication.  She was initially sen in 2011 with mild claudication symptoms.   She was treated with cilostazol with improvement.  She has had no prior lower extremity intervention.  She denise rest pain, non healing ulcers and claudication symptoms.    Pertinent medical history includes DM, ESRD on HD, hyperlipidemia and HTN.   She takes dailt Statin, ASA, Pletal and anti hypertensive's.  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   CHF (congestive heart failure) (HCC)    Chronic kidney disease    Tu/Th/Sa   Diabetes mellitus    Type II per Dr [name]  (patient said type I)   Fibroid    GERD (gastroesophageal reflux disease)    Glaucoma    Hyperlipidemia    Hypertension    Iron deficiency anemia 03/09/2016   Peripheral vascular disease ([place])    Pneumonia 2006   PONV (postoperative nausea and vomiting)    Shortness of breath dyspnea    with exdrtion, "Walkling too fast"   Stroke [facility])    no residual    Past Surgical History:  Procedure Laterality Date   ABDOMINAL HYSTERECTOMY     AV FISTULA PLACEMENT Left 04/27/2019   Procedure: INSERTION OF ARTERIOVENOUS (AV) GORE-TEX GRAFT LEFT UPPER ARM;  Surgeon: Angelia Mould, MD;  Location: [place];  Service: Vascular;  Laterality: Left;   BIOPSY  06/10/2017   Procedure: BIOPSY;  Surgeon: Ronnette Juniper, MD;  Location: [place];  Service: Gastroenterology;;   BREAST BIOPSY Right    CERVICAL FUSION     with graft from hip   COLONOSCOPY  July 09, 2012   DIRECT LARYNGOSCOPY N/A 06/07/2014   Procedure: DIRECT LARYNGOSCOPY with BIOPSY and EXCISION VOLLECULAR CYST;  Surgeon: Ruby Cola, MD;  Location: [place];  Service: ENT;  Laterality: N/A;    ESOPHAGOGASTRODUODENOSCOPY (EGD) WITH PROPOFOL Left 06/10/2017   Procedure: ESOPHAGOGASTRODUODENOSCOPY (EGD) WITH PROPOFOL;  Surgeon: Ronnette Juniper, MD;  Location: [place];  Service: Gastroenterology;  Laterality: Left;   FLEXIBLE SIGMOIDOSCOPY Left 06/10/2017   Procedure: FLEXIBLE SIGMOIDOSCOPY;  Surgeon: Ronnette Juniper, MD;  Location: [place];  Service: Gastroenterology;  Laterality: Left;   LOOP RECORDER INSERTION N/A 06/20/2016   Procedure: Loop Recorder Insertion;  Surgeon: [name], MD;  Location: [place] [facility];  Service: Cardiovascular;  Laterality: N/A;   REFRACTIVE SURGERY Bilateral    both eyes   SPINE SURGERY     lumbar    ROS:   General:  No weight loss, Fever, chills  HEENT: No recent headaches, no nasal bleeding, no visual changes, no sore throat  Neurologic: No dizziness, blackouts, seizures. No recent symptoms of stroke or mini- stroke. No recent episodes of slurred speech, or temporary blindness.  Cardiac: No recent episodes of chest pain/pressure, no shortness of breath at rest.  No shortness of breath with exertion.  Denies history of atrial fibrillation or irregular heartbeat  Vascular: No history of rest pain in feet.  No history of claudication.  No history of non-healing ulcer, No history of DVT   Pulmonary: No home oxygen, no productive cough, no hemoptysis,  No asthma or wheezing  Musculoskeletal:  [ ]  Arthritis, [ ]  Low back  pain,  [ ]  Joint pain  Hematologic:No history of hypercoagulable state.  No history of easy bleeding.  No history of anemia  Gastrointestinal: No hematochezia or melena,  No gastroesophageal reflux, no trouble swallowing  Urinary: [ ]  chronic Kidney disease, [ ]  on HD - [ ]  MWF or [ ]  TTHS, [ ]  Burning with urination, [ ]  Frequent urination, [ ]  Difficulty urinating;   Skin: No rashes  Psychological: No history of anxiety,  No history of depression  Social History Social History   Tobacco Use   Smoking status:  Every Day    Packs/day: 1.00    Years: 30.00    Pack years: 30.00    Types: Cigarettes   Smokeless tobacco: Never  Vaping Use   Vaping Use: Never used  Substance Use Topics   Alcohol use: Not Currently    Comment: socially   Drug use: Not Currently    Types: Methylphenidate    Family History Family History  Problem Relation Age of Onset   Cancer Mother    Heart disease Mother    Diabetes Father    Cancer Brother    Cancer Brother    Throat cancer Brother     Allergies  Allergies  Allergen Reactions   Morphine And Related Other (See Comments)    Hallucinations    Penicillins Swelling and Rash    Throat swelling Did it involve swelling of the face/tongue/throat, SOB, or low BP? Yes Did it involve sudden or severe rash/hives, skin peeling, or any reaction on the inside of your mouth or nose? Yes Did you need to seek medical attention at a hospital or doctor's office? Yes When did it last happen?   young child    If all above answers are "NO", may proceed with cephalosporin use.     Current Outpatient Medications  Medication Sig Dispense Refill   acetaminophen (TYLENOL) 325 MG tablet Take 2 tablets (650 mg total) by mouth every 6 (six) hours as needed for mild pain (or Fever >/= 101).     amLODipine (NORVASC) 10 MG tablet SMARTSIG:.5 Tablet(s) By Mouth Daily     aspirin 325 MG EC tablet Take 325 mg by mouth daily.      atorvastatin (LIPITOR) 20 MG tablet Take 1 tablet by mouth once daily 90 tablet 3   bisacodyl (DULCOLAX) 5 MG EC tablet Take 5 mg by mouth daily as needed for moderate constipation.     Blood Glucose Monitoring Suppl (FREESTYLE FREEDOM) KIT Use to check blood sugar once a day dx code 1 kit 1   cilostazol (PLETAL) 100 MG tablet Take 1 tablet by mouth twice daily 180 tablet 0   docusate sodium (COLACE) 100 MG capsule Take 200 mg by mouth daily.      Doxercalciferol (HECTOROL IV) Doxercalciferol (Hectorol)     ferrous sulfate 325 (65 FE) MG tablet Take 650  mg by mouth daily.     gabapentin (NEURONTIN) 100 MG capsule TAKE 1 CAPSULE BY MOUTH TWICE DAILY AS NEEDED 180 capsule 0   glucose blood (FREESTYLE LITE) test strip USE AS INSTRUCTED TO CHECK BLOOD SUGAR ONCE DAILY. 50 each 3   heparin 1000 unit/mL SOLN injection Heparin Sodium (Porcine) 1,000 Units/mL Systemic     insulin glargine (LANTUS) 100 UNIT/ML injection Lantus insulin 16 U in the morning daily. 15 mL 1   Insulin Pen Needle 31G X 5 MM MISC Use with pen 100 each 1   insulin regular (NOVOLIN R) 100 units/mL injection  Inject into the skin 2 (two) times daily before a meal. Inject 10 units under the skin before breakfast and 5-7 units before dinner.     iron sucrose in sodium chloride 0.9 % 100 mL Inject into the vein.     lamoTRIgine (LAMICTAL) 100 MG tablet Take 1 tablet (100 mg total) by mouth 2 (two) times daily. 180 tablet 3   lidocaine-prilocaine (EMLA) cream SMARTSIG:Sparingly Topical     Methoxy PEG-Epoetin Beta (MIRCERA IJ) Mircera     omeprazole (PRILOSEC) 20 MG capsule Take 1 capsule by mouth once daily 90 capsule 0   sevelamer carbonate (RENVELA) 800 MG tablet Take by mouth.     triamcinolone ointment (KENALOG) 0.1 % SMARTSIG:Liberally Topical Twice Daily     Vitamin D, Ergocalciferol, (DRISDOL) 1.25 MG (50000 UNIT) CAPS capsule Take 1 capsule by mouth once a week 12 capsule 0   No current facility-administered medications for this visit.    Physical Examination  Vitals:   11/28/20 1149  BP: (!) 151/76  Pulse: 84  Resp: 20  Temp: (!) 97.5 F (36.4 C)  TempSrc: Temporal  SpO2: 99%  Weight: 137 lb 14.4 oz (62.6 kg)  Height: 5' 3"  (1.6 m)    Body mass index is 24.43 kg/m.  General:  Alert and oriented, no acute distress HEENT: Normal Neck: No bruit or JVD Pulmonary: Clear to auscultation bilaterally Cardiac: Regular Rate and Rhythm with murmur Abdomen: Soft, non-tender, non-distended, no mass, no scars Skin: No rash Extremity Pulses:  2+ radial, brachial,  femoral, right dorsalis pedis, posterior tibial pulses, left none palpable  Musculoskeletal: No deformity or edema  Neurologic: Upper and lower extremity motor 5/5 and symmetric  DATA:  ABI Findings:  +---------+------------------+-----+---------+--------+  Right    Rt Pressure (mmHg)IndexWaveform Comment   +---------+------------------+-----+---------+--------+  Brachial 158                                       +---------+------------------+-----+---------+--------+  PTA      144               0.91 biphasic           +---------+------------------+-----+---------+--------+  DP       173               1.09 triphasic          +---------+------------------+-----+---------+--------+  Great Toe119               0.75 Normal             +---------+------------------+-----+---------+--------+   +---------+------------------+-----+--------+-------+  Left     Lt Pressure (mmHg)IndexWaveformComment  +---------+------------------+-----+--------+-------+  PTA      111               0.70 biphasic         +---------+------------------+-----+--------+-------+  DP       105               0.66 biphasic         +---------+------------------+-----+--------+-------+  Lenon Ahmadi                0.56 Normal           +---------+------------------+-----+--------+-------+   +-------+-----------+-----------+------------+------------+  ABI/TBIToday's ABIToday's TBIPrevious ABIPrevious TBI  +-------+-----------+-----------+------------+------------+  Right  1.09       0.75       1.06        0.81          +-------+-----------+-----------+------------+------------+  Left   0.70       0.56       0.72        0.61          +-------+-----------+-----------+------------+------------+        ASSESSMENT/Plan: PAD with history of claudication managed with Pletal She denise new symptoms of PAD.  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   Bilateral ABIs appear essentially unchanged.  Stay active, have someone check her feet for injuries.  If she develops new symptoms of PAD such as claudication, rest pain or non healing ulcer she will call, otherwise she will f/u in 1 year for repeat ABI's.   Roxy Horseman PA-C [facility] [place] Office: [phone] Pager: [phone]

## 2020-11-29 ENCOUNTER — Other Ambulatory Visit: Payer: Self-pay | Admitting: Endocrinology

## 2020-11-29 DIAGNOSIS — N2581 Secondary hyperparathyroidism of renal origin: Secondary | ICD-10-CM | POA: Diagnosis not present

## 2020-11-29 DIAGNOSIS — E1165 Type 2 diabetes mellitus with hyperglycemia: Secondary | ICD-10-CM

## 2020-11-29 DIAGNOSIS — E1129 Type 2 diabetes mellitus with other diabetic kidney complication: Secondary | ICD-10-CM | POA: Diagnosis not present

## 2020-11-29 DIAGNOSIS — E1122 Type 2 diabetes mellitus with diabetic chronic kidney disease: Secondary | ICD-10-CM | POA: Diagnosis not present

## 2020-11-29 DIAGNOSIS — D631 Anemia in chronic kidney disease: Secondary | ICD-10-CM | POA: Diagnosis not present

## 2020-11-29 DIAGNOSIS — N186 End stage renal disease: Secondary | ICD-10-CM | POA: Diagnosis not present

## 2020-11-29 DIAGNOSIS — Z992 Dependence on renal dialysis: Secondary | ICD-10-CM | POA: Diagnosis not present

## 2020-12-01 DIAGNOSIS — Z992 Dependence on renal dialysis: Secondary | ICD-10-CM | POA: Diagnosis not present

## 2020-12-01 DIAGNOSIS — N2581 Secondary hyperparathyroidism of renal origin: Secondary | ICD-10-CM | POA: Diagnosis not present

## 2020-12-01 DIAGNOSIS — N186 End stage renal disease: Secondary | ICD-10-CM | POA: Diagnosis not present

## 2020-12-01 DIAGNOSIS — D631 Anemia in chronic kidney disease: Secondary | ICD-10-CM | POA: Diagnosis not present

## 2020-12-01 DIAGNOSIS — E1129 Type 2 diabetes mellitus with other diabetic kidney complication: Secondary | ICD-10-CM | POA: Diagnosis not present

## 2020-12-03 DIAGNOSIS — E1129 Type 2 diabetes mellitus with other diabetic kidney complication: Secondary | ICD-10-CM | POA: Diagnosis not present

## 2020-12-03 DIAGNOSIS — N186 End stage renal disease: Secondary | ICD-10-CM | POA: Diagnosis not present

## 2020-12-03 DIAGNOSIS — Z992 Dependence on renal dialysis: Secondary | ICD-10-CM | POA: Diagnosis not present

## 2020-12-03 DIAGNOSIS — N2581 Secondary hyperparathyroidism of renal origin: Secondary | ICD-10-CM | POA: Diagnosis not present

## 2020-12-03 DIAGNOSIS — D631 Anemia in chronic kidney disease: Secondary | ICD-10-CM | POA: Diagnosis not present

## 2020-12-06 DIAGNOSIS — Z992 Dependence on renal dialysis: Secondary | ICD-10-CM | POA: Diagnosis not present

## 2020-12-06 DIAGNOSIS — E1129 Type 2 diabetes mellitus with other diabetic kidney complication: Secondary | ICD-10-CM | POA: Diagnosis not present

## 2020-12-06 DIAGNOSIS — N2581 Secondary hyperparathyroidism of renal origin: Secondary | ICD-10-CM | POA: Diagnosis not present

## 2020-12-06 DIAGNOSIS — N186 End stage renal disease: Secondary | ICD-10-CM | POA: Diagnosis not present

## 2020-12-06 DIAGNOSIS — D631 Anemia in chronic kidney disease: Secondary | ICD-10-CM | POA: Diagnosis not present

## 2020-12-07 ENCOUNTER — Ambulatory Visit: Payer: Medicare Other | Admitting: Neurology

## 2020-12-08 DIAGNOSIS — N186 End stage renal disease: Secondary | ICD-10-CM | POA: Diagnosis not present

## 2020-12-08 DIAGNOSIS — N2581 Secondary hyperparathyroidism of renal origin: Secondary | ICD-10-CM | POA: Diagnosis not present

## 2020-12-08 DIAGNOSIS — Z992 Dependence on renal dialysis: Secondary | ICD-10-CM | POA: Diagnosis not present

## 2020-12-08 DIAGNOSIS — E1129 Type 2 diabetes mellitus with other diabetic kidney complication: Secondary | ICD-10-CM | POA: Diagnosis not present

## 2020-12-08 DIAGNOSIS — D631 Anemia in chronic kidney disease: Secondary | ICD-10-CM | POA: Diagnosis not present

## 2020-12-10 DIAGNOSIS — N2581 Secondary hyperparathyroidism of renal origin: Secondary | ICD-10-CM | POA: Diagnosis not present

## 2020-12-10 DIAGNOSIS — E1129 Type 2 diabetes mellitus with other diabetic kidney complication: Secondary | ICD-10-CM | POA: Diagnosis not present

## 2020-12-10 DIAGNOSIS — D631 Anemia in chronic kidney disease: Secondary | ICD-10-CM | POA: Diagnosis not present

## 2020-12-10 DIAGNOSIS — N186 End stage renal disease: Secondary | ICD-10-CM | POA: Diagnosis not present

## 2020-12-10 DIAGNOSIS — Z992 Dependence on renal dialysis: Secondary | ICD-10-CM | POA: Diagnosis not present

## 2020-12-13 DIAGNOSIS — N2581 Secondary hyperparathyroidism of renal origin: Secondary | ICD-10-CM | POA: Diagnosis not present

## 2020-12-13 DIAGNOSIS — D631 Anemia in chronic kidney disease: Secondary | ICD-10-CM | POA: Diagnosis not present

## 2020-12-13 DIAGNOSIS — N186 End stage renal disease: Secondary | ICD-10-CM | POA: Diagnosis not present

## 2020-12-13 DIAGNOSIS — Z992 Dependence on renal dialysis: Secondary | ICD-10-CM | POA: Diagnosis not present

## 2020-12-13 DIAGNOSIS — E1129 Type 2 diabetes mellitus with other diabetic kidney complication: Secondary | ICD-10-CM | POA: Diagnosis not present

## 2020-12-15 DIAGNOSIS — R55 Syncope and collapse: Secondary | ICD-10-CM | POA: Diagnosis not present

## 2020-12-15 DIAGNOSIS — S0990XA Unspecified injury of head, initial encounter: Secondary | ICD-10-CM | POA: Diagnosis not present

## 2020-12-15 DIAGNOSIS — I959 Hypotension, unspecified: Secondary | ICD-10-CM | POA: Diagnosis not present

## 2020-12-15 DIAGNOSIS — W19XXXA Unspecified fall, initial encounter: Secondary | ICD-10-CM | POA: Diagnosis not present

## 2020-12-17 DIAGNOSIS — D631 Anemia in chronic kidney disease: Secondary | ICD-10-CM | POA: Diagnosis not present

## 2020-12-17 DIAGNOSIS — N186 End stage renal disease: Secondary | ICD-10-CM | POA: Diagnosis not present

## 2020-12-17 DIAGNOSIS — E1129 Type 2 diabetes mellitus with other diabetic kidney complication: Secondary | ICD-10-CM | POA: Diagnosis not present

## 2020-12-17 DIAGNOSIS — Z992 Dependence on renal dialysis: Secondary | ICD-10-CM | POA: Diagnosis not present

## 2020-12-17 DIAGNOSIS — N2581 Secondary hyperparathyroidism of renal origin: Secondary | ICD-10-CM | POA: Diagnosis not present

## 2020-12-20 DIAGNOSIS — N2581 Secondary hyperparathyroidism of renal origin: Secondary | ICD-10-CM | POA: Diagnosis not present

## 2020-12-20 DIAGNOSIS — E1129 Type 2 diabetes mellitus with other diabetic kidney complication: Secondary | ICD-10-CM | POA: Diagnosis not present

## 2020-12-20 DIAGNOSIS — N186 End stage renal disease: Secondary | ICD-10-CM | POA: Diagnosis not present

## 2020-12-20 DIAGNOSIS — D631 Anemia in chronic kidney disease: Secondary | ICD-10-CM | POA: Diagnosis not present

## 2020-12-20 DIAGNOSIS — Z992 Dependence on renal dialysis: Secondary | ICD-10-CM | POA: Diagnosis not present

## 2020-12-27 DIAGNOSIS — Z992 Dependence on renal dialysis: Secondary | ICD-10-CM | POA: Diagnosis not present

## 2020-12-27 DIAGNOSIS — D631 Anemia in chronic kidney disease: Secondary | ICD-10-CM | POA: Diagnosis not present

## 2020-12-27 DIAGNOSIS — N186 End stage renal disease: Secondary | ICD-10-CM | POA: Diagnosis not present

## 2020-12-27 DIAGNOSIS — E1129 Type 2 diabetes mellitus with other diabetic kidney complication: Secondary | ICD-10-CM | POA: Diagnosis not present

## 2020-12-27 DIAGNOSIS — N2581 Secondary hyperparathyroidism of renal origin: Secondary | ICD-10-CM | POA: Diagnosis not present

## 2020-12-28 ENCOUNTER — Ambulatory Visit: Payer: Medicare Other | Admitting: Podiatry

## 2020-12-29 DIAGNOSIS — N186 End stage renal disease: Secondary | ICD-10-CM | POA: Diagnosis not present

## 2020-12-29 DIAGNOSIS — Z992 Dependence on renal dialysis: Secondary | ICD-10-CM | POA: Diagnosis not present

## 2020-12-29 DIAGNOSIS — E1129 Type 2 diabetes mellitus with other diabetic kidney complication: Secondary | ICD-10-CM | POA: Diagnosis not present

## 2020-12-29 DIAGNOSIS — N2581 Secondary hyperparathyroidism of renal origin: Secondary | ICD-10-CM | POA: Diagnosis not present

## 2020-12-29 DIAGNOSIS — E1122 Type 2 diabetes mellitus with diabetic chronic kidney disease: Secondary | ICD-10-CM | POA: Diagnosis not present

## 2020-12-31 DIAGNOSIS — N2581 Secondary hyperparathyroidism of renal origin: Secondary | ICD-10-CM | POA: Diagnosis not present

## 2020-12-31 DIAGNOSIS — E1129 Type 2 diabetes mellitus with other diabetic kidney complication: Secondary | ICD-10-CM | POA: Diagnosis not present

## 2020-12-31 DIAGNOSIS — N186 End stage renal disease: Secondary | ICD-10-CM | POA: Diagnosis not present

## 2020-12-31 DIAGNOSIS — Z992 Dependence on renal dialysis: Secondary | ICD-10-CM | POA: Diagnosis not present

## 2021-01-01 ENCOUNTER — Other Ambulatory Visit: Payer: Self-pay | Admitting: Endocrinology

## 2021-01-03 DIAGNOSIS — N2581 Secondary hyperparathyroidism of renal origin: Secondary | ICD-10-CM | POA: Diagnosis not present

## 2021-01-03 DIAGNOSIS — Z992 Dependence on renal dialysis: Secondary | ICD-10-CM | POA: Diagnosis not present

## 2021-01-03 DIAGNOSIS — E1129 Type 2 diabetes mellitus with other diabetic kidney complication: Secondary | ICD-10-CM | POA: Diagnosis not present

## 2021-01-03 DIAGNOSIS — N186 End stage renal disease: Secondary | ICD-10-CM | POA: Diagnosis not present

## 2021-01-05 DIAGNOSIS — N186 End stage renal disease: Secondary | ICD-10-CM | POA: Diagnosis not present

## 2021-01-05 DIAGNOSIS — Z992 Dependence on renal dialysis: Secondary | ICD-10-CM | POA: Diagnosis not present

## 2021-01-05 DIAGNOSIS — E1129 Type 2 diabetes mellitus with other diabetic kidney complication: Secondary | ICD-10-CM | POA: Diagnosis not present

## 2021-01-05 DIAGNOSIS — N2581 Secondary hyperparathyroidism of renal origin: Secondary | ICD-10-CM | POA: Diagnosis not present

## 2021-01-07 DIAGNOSIS — Z992 Dependence on renal dialysis: Secondary | ICD-10-CM | POA: Diagnosis not present

## 2021-01-07 DIAGNOSIS — N186 End stage renal disease: Secondary | ICD-10-CM | POA: Diagnosis not present

## 2021-01-07 DIAGNOSIS — E1129 Type 2 diabetes mellitus with other diabetic kidney complication: Secondary | ICD-10-CM | POA: Diagnosis not present

## 2021-01-07 DIAGNOSIS — N2581 Secondary hyperparathyroidism of renal origin: Secondary | ICD-10-CM | POA: Diagnosis not present

## 2021-01-10 DIAGNOSIS — Z992 Dependence on renal dialysis: Secondary | ICD-10-CM | POA: Diagnosis not present

## 2021-01-10 DIAGNOSIS — N186 End stage renal disease: Secondary | ICD-10-CM | POA: Diagnosis not present

## 2021-01-10 DIAGNOSIS — N2581 Secondary hyperparathyroidism of renal origin: Secondary | ICD-10-CM | POA: Diagnosis not present

## 2021-01-10 DIAGNOSIS — E1129 Type 2 diabetes mellitus with other diabetic kidney complication: Secondary | ICD-10-CM | POA: Diagnosis not present

## 2021-01-11 ENCOUNTER — Other Ambulatory Visit: Payer: Self-pay | Admitting: Endocrinology

## 2021-01-12 DIAGNOSIS — Z992 Dependence on renal dialysis: Secondary | ICD-10-CM | POA: Diagnosis not present

## 2021-01-12 DIAGNOSIS — N186 End stage renal disease: Secondary | ICD-10-CM | POA: Diagnosis not present

## 2021-01-12 DIAGNOSIS — E1129 Type 2 diabetes mellitus with other diabetic kidney complication: Secondary | ICD-10-CM | POA: Diagnosis not present

## 2021-01-12 DIAGNOSIS — N2581 Secondary hyperparathyroidism of renal origin: Secondary | ICD-10-CM | POA: Diagnosis not present

## 2021-01-17 DIAGNOSIS — N2581 Secondary hyperparathyroidism of renal origin: Secondary | ICD-10-CM | POA: Diagnosis not present

## 2021-01-17 DIAGNOSIS — Z992 Dependence on renal dialysis: Secondary | ICD-10-CM | POA: Diagnosis not present

## 2021-01-17 DIAGNOSIS — E1129 Type 2 diabetes mellitus with other diabetic kidney complication: Secondary | ICD-10-CM | POA: Diagnosis not present

## 2021-01-17 DIAGNOSIS — N186 End stage renal disease: Secondary | ICD-10-CM | POA: Diagnosis not present

## 2021-01-19 DIAGNOSIS — N186 End stage renal disease: Secondary | ICD-10-CM | POA: Diagnosis not present

## 2021-01-19 DIAGNOSIS — E1129 Type 2 diabetes mellitus with other diabetic kidney complication: Secondary | ICD-10-CM | POA: Diagnosis not present

## 2021-01-19 DIAGNOSIS — N2581 Secondary hyperparathyroidism of renal origin: Secondary | ICD-10-CM | POA: Diagnosis not present

## 2021-01-19 DIAGNOSIS — Z992 Dependence on renal dialysis: Secondary | ICD-10-CM | POA: Diagnosis not present

## 2021-01-20 ENCOUNTER — Ambulatory Visit (INDEPENDENT_AMBULATORY_CARE_PROVIDER_SITE_OTHER): Payer: Medicare Other | Admitting: Podiatry

## 2021-01-20 DIAGNOSIS — E1151 Type 2 diabetes mellitus with diabetic peripheral angiopathy without gangrene: Secondary | ICD-10-CM | POA: Diagnosis not present

## 2021-01-20 DIAGNOSIS — B351 Tinea unguium: Secondary | ICD-10-CM

## 2021-01-20 DIAGNOSIS — M79674 Pain in right toe(s): Secondary | ICD-10-CM | POA: Diagnosis not present

## 2021-01-20 DIAGNOSIS — M79675 Pain in left toe(s): Secondary | ICD-10-CM | POA: Diagnosis not present

## 2021-01-21 DIAGNOSIS — N2581 Secondary hyperparathyroidism of renal origin: Secondary | ICD-10-CM | POA: Diagnosis not present

## 2021-01-21 DIAGNOSIS — E1129 Type 2 diabetes mellitus with other diabetic kidney complication: Secondary | ICD-10-CM | POA: Diagnosis not present

## 2021-01-21 DIAGNOSIS — Z992 Dependence on renal dialysis: Secondary | ICD-10-CM | POA: Diagnosis not present

## 2021-01-21 DIAGNOSIS — N186 End stage renal disease: Secondary | ICD-10-CM | POA: Diagnosis not present

## 2021-01-24 ENCOUNTER — Encounter: Payer: Self-pay | Admitting: Podiatry

## 2021-01-24 DIAGNOSIS — E1129 Type 2 diabetes mellitus with other diabetic kidney complication: Secondary | ICD-10-CM | POA: Diagnosis not present

## 2021-01-24 DIAGNOSIS — N186 End stage renal disease: Secondary | ICD-10-CM | POA: Diagnosis not present

## 2021-01-24 DIAGNOSIS — N2581 Secondary hyperparathyroidism of renal origin: Secondary | ICD-10-CM | POA: Diagnosis not present

## 2021-01-24 DIAGNOSIS — Z992 Dependence on renal dialysis: Secondary | ICD-10-CM | POA: Diagnosis not present

## 2021-01-24 NOTE — Progress Notes (Signed)
°  Subjective:  Patient ID: Julie Brewer, female    DOB: 28-Jun-1950,  MRN: [id number]  No chief complaint on file.  70 y.o. female returns for the above complaint.  Patient is a type II diabetic whose A1c is 6.1.  Patient presents with thickened elongated mycotic toenails x10.  Patient states that she is not able to cut it herself.  She would like to bring [place] to debride them down.  She denies any other acute complaints.  Objective:  There were no vitals filed for this visit. Podiatric Exam: Vascular: dorsalis pedis and posterior tibial pulses are palpable bilateral. Capillary return is immediate. Temperature gradient is WNL. Skin turgor WNL  Sensorium: Normal Semmes Weinstein monofilament test. Normal tactile sensation bilaterally. Nail Exam: Pt has thick disfigured discolored nails with subungual debris noted bilateral entire nail hallux through fifth toenails Ulcer Exam: There is no evidence of ulcer or pre-ulcerative changes or infection. Orthopedic Exam: Muscle tone and strength are WNL. No limitations in general ROM. No crepitus or effusions noted. HAV  B/L.  Hammer toes 2-5  B/L. Skin: No Porokeratosis. No infection or ulcers.  Submet 3 hyperkeratotic lesion/callus x2.  Pain on palpation.  Assessment & Plan:  Patient was evaluated and treated and all questions answered.  Hammertoe contractures bilateral two through five -I explained to patient the etiology of hammertoe contracture as well as treatment options were discussed. Given that patient has underlying diabetes with contractures of the hammertoes I believe patient will benefit from diabetic shoes to prevent ulceration leading to amputation. Patient agrees with the plan. -Patient is currently wearing diabetic shoes.  No acute complaints noted.  Bilateral submet 3 hyperkeratotic lesion/callus x2 -Resolving  Onychomycosis with pain  -Nails palliatively debrided as below. -Educated on self-care  Procedure: Nail  Debridement Rationale: pain  Type of Debridement: manual, sharp debridement. Instrumentation: Nail nipper, rotary burr. Number of Nails: 10  Procedures and Treatment: Consent by patient was obtained for treatment procedures. The patient understood the discussion of treatment and procedures well. All questions were answered thoroughly reviewed. Debridement of mycotic and hypertrophic toenails, 1 through 5 bilateral and clearing of subungual debris. No ulceration, no infection noted.  Return Visit-Office Procedure: Patient instructed to return to the office for a follow up visit 3 months for continued evaluation and treatment.  Boneta Lucks, DPM    No follow-ups on file.

## 2021-01-28 DIAGNOSIS — N2581 Secondary hyperparathyroidism of renal origin: Secondary | ICD-10-CM | POA: Diagnosis not present

## 2021-01-28 DIAGNOSIS — N186 End stage renal disease: Secondary | ICD-10-CM | POA: Diagnosis not present

## 2021-01-28 DIAGNOSIS — E1129 Type 2 diabetes mellitus with other diabetic kidney complication: Secondary | ICD-10-CM | POA: Diagnosis not present

## 2021-01-28 DIAGNOSIS — Z992 Dependence on renal dialysis: Secondary | ICD-10-CM | POA: Diagnosis not present

## 2021-01-29 DIAGNOSIS — E1122 Type 2 diabetes mellitus with diabetic chronic kidney disease: Secondary | ICD-10-CM | POA: Diagnosis not present

## 2021-01-29 DIAGNOSIS — Z992 Dependence on renal dialysis: Secondary | ICD-10-CM | POA: Diagnosis not present

## 2021-01-29 DIAGNOSIS — N186 End stage renal disease: Secondary | ICD-10-CM | POA: Diagnosis not present

## 2021-01-31 ENCOUNTER — Other Ambulatory Visit: Payer: Self-pay | Admitting: Vascular Surgery

## 2021-01-31 DIAGNOSIS — E1129 Type 2 diabetes mellitus with other diabetic kidney complication: Secondary | ICD-10-CM | POA: Diagnosis not present

## 2021-01-31 DIAGNOSIS — N186 End stage renal disease: Secondary | ICD-10-CM | POA: Diagnosis not present

## 2021-01-31 DIAGNOSIS — Z992 Dependence on renal dialysis: Secondary | ICD-10-CM | POA: Diagnosis not present

## 2021-01-31 DIAGNOSIS — N2581 Secondary hyperparathyroidism of renal origin: Secondary | ICD-10-CM | POA: Diagnosis not present

## 2021-02-02 DIAGNOSIS — E1129 Type 2 diabetes mellitus with other diabetic kidney complication: Secondary | ICD-10-CM | POA: Diagnosis not present

## 2021-02-02 DIAGNOSIS — N2581 Secondary hyperparathyroidism of renal origin: Secondary | ICD-10-CM | POA: Diagnosis not present

## 2021-02-02 DIAGNOSIS — Z992 Dependence on renal dialysis: Secondary | ICD-10-CM | POA: Diagnosis not present

## 2021-02-02 DIAGNOSIS — N186 End stage renal disease: Secondary | ICD-10-CM | POA: Diagnosis not present

## 2021-02-03 ENCOUNTER — Other Ambulatory Visit: Payer: Self-pay

## 2021-02-03 ENCOUNTER — Other Ambulatory Visit (INDEPENDENT_AMBULATORY_CARE_PROVIDER_SITE_OTHER): Payer: Medicare Other

## 2021-02-03 DIAGNOSIS — Z794 Long term (current) use of insulin: Secondary | ICD-10-CM | POA: Diagnosis not present

## 2021-02-03 DIAGNOSIS — E1165 Type 2 diabetes mellitus with hyperglycemia: Secondary | ICD-10-CM

## 2021-02-03 LAB — GLUCOSE, RANDOM: Glucose, Bld: 98 mg/dL (ref 70–99)

## 2021-02-03 LAB — HEMOGLOBIN A1C: Hgb A1c MFr Bld: 6.3 % (ref 4.6–6.5)

## 2021-02-04 DIAGNOSIS — N186 End stage renal disease: Secondary | ICD-10-CM | POA: Diagnosis not present

## 2021-02-04 DIAGNOSIS — Z992 Dependence on renal dialysis: Secondary | ICD-10-CM | POA: Diagnosis not present

## 2021-02-04 DIAGNOSIS — N2581 Secondary hyperparathyroidism of renal origin: Secondary | ICD-10-CM | POA: Diagnosis not present

## 2021-02-04 DIAGNOSIS — E1129 Type 2 diabetes mellitus with other diabetic kidney complication: Secondary | ICD-10-CM | POA: Diagnosis not present

## 2021-02-04 LAB — FRUCTOSAMINE: Fructosamine: 295 umol/L — ABNORMAL HIGH (ref 0–285)

## 2021-02-06 ENCOUNTER — Ambulatory Visit: Payer: Medicare Other | Admitting: Endocrinology

## 2021-02-07 DIAGNOSIS — E1129 Type 2 diabetes mellitus with other diabetic kidney complication: Secondary | ICD-10-CM | POA: Diagnosis not present

## 2021-02-07 DIAGNOSIS — Z992 Dependence on renal dialysis: Secondary | ICD-10-CM | POA: Diagnosis not present

## 2021-02-07 DIAGNOSIS — N2581 Secondary hyperparathyroidism of renal origin: Secondary | ICD-10-CM | POA: Diagnosis not present

## 2021-02-07 DIAGNOSIS — N186 End stage renal disease: Secondary | ICD-10-CM | POA: Diagnosis not present

## 2021-02-08 ENCOUNTER — Other Ambulatory Visit: Payer: Self-pay

## 2021-02-08 ENCOUNTER — Ambulatory Visit (INDEPENDENT_AMBULATORY_CARE_PROVIDER_SITE_OTHER): Payer: Medicare Other | Admitting: Endocrinology

## 2021-02-08 VITALS — BP 140/60 | HR 91 | Ht 63.0 in | Wt 133.2 lb

## 2021-02-08 DIAGNOSIS — E782 Mixed hyperlipidemia: Secondary | ICD-10-CM

## 2021-02-08 DIAGNOSIS — E1151 Type 2 diabetes mellitus with diabetic peripheral angiopathy without gangrene: Secondary | ICD-10-CM

## 2021-02-08 DIAGNOSIS — Z794 Long term (current) use of insulin: Secondary | ICD-10-CM

## 2021-02-08 DIAGNOSIS — E1165 Type 2 diabetes mellitus with hyperglycemia: Secondary | ICD-10-CM

## 2021-02-08 NOTE — Progress Notes (Signed)
Patient ID: Julie Brewer, female   DOB: 10/08/1950, 71 y.o.   MRN: [id number]   Reason for Appointment: Follow-up of various problems  History of Present Illness    Diagnosis: Type 2 DIABETES MELITUS, date of diagnosis:  1985  Prior history: She has been on insulin since diagnosis and on Lantus previously Also at some point had been started on Glucophage several years ago also Because of insurance preference Lantus was changed to Levemir  She refuses to use analog rapid acting insulin because of cost and is using regular insulin for several years   Her blood sugars are generally well controlled and A1c usually under 7% Her A1c previously was higher with stopping metformin at 8%  Recent history:   Insulin regimen: Lantus insulin 10 units in the morning daily.  Regular insulin 20-30 minutes Before eating, 10 units a.m. and 8 ac supper  Oral hypoglycemic drugs: None   Current blood sugar patterns, management and problems:  Her A1c is 6.3 compared to 5.4 Fructosamine is 295  She is checking her blood sugars with the freestyle libre but again it appears to be reading lower than the fingerstick reading in the office Her blood sugar after breakfast today was 250 in the office and 229 at the same time with her Elenor Legato  She also has a span of about 24 hours when her blood sugar was reading mostly low which is not typical However she thinks she may have been sweating more on those days She thinks her daughter did adjust her insulin doses as given in writing on the last visit with reduced Lantus and evening regular insulin As usual she cannot afford any brand-name insulin and is buying regular insulin OTC from [facility]  She however tends to have high readings after breakfast  In the morning she is usually eating oatmeal  with sometimes a meat  She likely has higher readings when she does not have a sausage or bacon with her oatmeal  Even with reducing her Lantus her  overnight blood sugars appear to be low normal on her libre readings  Her daughter is drawing up her insulin with the syringe   Side effects from medications: None      Glucometer:  FreeStyle libre  Interpretation of the [employer] for the last 2 weeks as follows  Data is incomplete with only 44% active CGM time and very little information or any between about 8 PM-3 AM. This is due to her checking blood sugars mostly before her meals only OVERNIGHT blood sugars are generally low normal and 1/4 there were persistently low during the night, lowest reading with the reader was 57 Otherwise has manually checked her blood sugars only rarely overnight She has significant rise in blood sugar after breakfast noted on most days with some variability Blood sugars after dinner are not evaluated and only on 1 occasion she has low normal readings on 2 occasions, once was 258 possibly with manual reading  Hypoglycemia as above overnight with another episode of low sugars late afternoon on 1/9   CGM use % of time 44  2-week average/GV 104/39  Time in range 80      %  % Time Above 180 8  % Time above 250   % Time Below 70 12 was 26     PRE-MEAL Fasting Lunch Dinner 12 AM-2 AM Overall  Glucose range:       Averages: 86   55 104   POST-MEAL PC  Breakfast PC Lunch PC Dinner  Glucose range:     Averages: 150  122   Previously:    CGM use % of time 48  2-week average/GV 104/47  Time in range     65   %  % Time Above 180 8+1  % Time above 250   % Time Below 70 26     PRE-MEAL Fasting Lunch Dinner 4-6 AM Overall  Glucose range:       Averages: 72  109  67 104   POST-MEAL PC Breakfast PC Lunch PC Dinner  Glucose range:     Averages: 151  135    Meals:  usually 2 meals per day at 10 AM and 5 PM.     Mealtime protein sources:turkey, chicken.  Eating cereal for breakfast Or oatmeal Avoiding sweet drinks  Physical activity: exercise: Some walking within the house               Wt Readings from Last 3 Encounters:  02/08/21 133 lb 3.2 oz (60.4 kg)  11/28/20 137 lb 14.4 oz (62.6 kg)  11/14/20 134 lb 3.2 oz (60.9 kg)   Lab Results  Component Value Date   HGBA1C 6.3 02/03/2021   HGBA1C 5.4 11/11/2020   HGBA1C 6.3 08/10/2020   Lab Results  Component Value Date   MICROALBUR 196.7 (H) 03/11/2018   LDLCALC 71 08/10/2020   CREATININE 2.44 (H) 06/25/2019      Lab Results  Component Value Date   FRUCTOSAMINE 295 (H) 02/03/2021   FRUCTOSAMINE 275 05/18/2020   FRUCTOSAMINE 368 (H) 02/03/2020   FRUCTOSAMINE 293 (H) 09/28/2019    Other active problems: See review of systems      Allergies as of 02/08/2021       Reactions   Morphine And Related Other (See Comments)   Hallucinations    Penicillins Swelling, Rash   Throat swelling Did it involve swelling of the face/tongue/throat, SOB, or low BP? Yes Did it involve sudden or severe rash/hives, skin peeling, or any reaction on the inside of your mouth or nose? Yes Did you need to seek medical attention at a hospital or doctor's office? Yes When did it last happen?   young child    If all above answers are NO, may proceed with cephalosporin use.        Medication List        Accurate as of February 08, 2021 11:09 AM. If you have any questions, ask your nurse or doctor.          acetaminophen 325 MG tablet Commonly known as: TYLENOL Take 2 tablets (650 mg total) by mouth every 6 (six) hours as needed for mild pain (or Fever >/= 101).   amLODipine 10 MG tablet Commonly known as: NORVASC SMARTSIG:.5 Tablet(s) By Mouth Daily   aspirin 325 MG EC tablet Take 325 mg by mouth daily.   atorvastatin 20 MG tablet Commonly known as: LIPITOR Take 1 tablet by mouth once daily   bisacodyl 5 MG EC tablet Commonly known as: DULCOLAX Take 5 mg by mouth daily as needed for moderate constipation.   cilostazol 100 MG tablet Commonly known as: PLETAL Take 1 tablet by mouth twice daily   docusate  sodium 100 MG capsule Commonly known as: COLACE Take 200 mg by mouth daily.   ferrous sulfate 325 (65 FE) MG tablet Take 650 mg by mouth daily.   FreeStyle Freedom Kit Use to check blood sugar once a day dx code  FREESTYLE LITE test strip Generic drug: glucose blood USE AS INSTRUCTED TO CHECK BLOOD SUGAR ONCE DAILY.   gabapentin 100 MG capsule Commonly known as: NEURONTIN TAKE 1 CAPSULE BY MOUTH TWICE DAILY AS NEEDED   HECTOROL IV Doxercalciferol (Hectorol)   heparin 1000 unit/mL Soln injection Heparin Sodium (Porcine) 1,000 Units/mL Systemic   insulin glargine 100 UNIT/ML injection Commonly known as: Lantus UBHECT 16 UNITS INTO THE SKIN IN THE MORNING DAILY   Insulin Pen Needle 31G X 5 MM Misc Use with pen   insulin regular 100 units/mL injection Commonly known as: NOVOLIN R Inject into the skin 2 (two) times daily before a meal. Inject 10 units under the skin before breakfast and 5-7 units before dinner.   iron sucrose in sodium chloride 0.9 % 100 mL Inject into the vein.   lamoTRIgine 100 MG tablet Commonly known as: LaMICtal Take 1 tablet (100 mg total) by mouth 2 (two) times daily.   lidocaine-prilocaine cream Commonly known as: EMLA SMARTSIG:Sparingly Topical   MIRCERA IJ Mircera   omeprazole 20 MG capsule Commonly known as: PRILOSEC Take 1 capsule by mouth once daily   sevelamer carbonate 800 MG tablet Commonly known as: RENVELA Take by mouth.   triamcinolone ointment 0.1 % Commonly known as: KENALOG SMARTSIG:Liberally Topical Twice Daily   Vitamin D (Ergocalciferol) 1.25 MG (50000 UNIT) Caps capsule Commonly known as: DRISDOL Take 1 capsule by mouth once a week        Allergies:  Allergies  Allergen Reactions   Morphine And Related Other (See Comments)    Hallucinations    Penicillins Swelling and Rash    Throat swelling Did it involve swelling of the face/tongue/throat, SOB, or low BP? Yes Did it involve sudden or severe  rash/hives, skin peeling, or any reaction on the inside of your mouth or nose? Yes Did you need to seek medical attention at a hospital or doctor's office? Yes When did it last happen?   young child    If all above answers are NO, may proceed with cephalosporin use.    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   CHF (congestive heart failure) (HCC)    Chronic kidney disease    Tu/Th/Sa   Diabetes mellitus    Type II per Dr [name]  (patient said type I)   Fibroid    GERD (gastroesophageal reflux disease)    Glaucoma    Hyperlipidemia    Hypertension    Iron deficiency anemia 03/09/2016   Peripheral vascular disease ([place])    Pneumonia 2006   PONV (postoperative nausea and vomiting)    Shortness of breath dyspnea    with exdrtion, "Walkling too fast"   Stroke [facility])    no residual    Past Surgical History:  Procedure Laterality Date   ABDOMINAL HYSTERECTOMY     AV FISTULA PLACEMENT Left 04/27/2019   Procedure: INSERTION OF ARTERIOVENOUS (AV) GORE-TEX GRAFT LEFT UPPER ARM;  Surgeon: Angelia Mould, MD;  Location: [place];  Service: Vascular;  Laterality: Left;   BIOPSY  06/10/2017   Procedure: BIOPSY;  Surgeon: Ronnette Juniper, MD;  Location: [place];  Service: Gastroenterology;;   BREAST BIOPSY Right    CERVICAL FUSION     with graft from hip   COLONOSCOPY  July 09, 2012   DIRECT LARYNGOSCOPY N/A 06/07/2014  Procedure: DIRECT LARYNGOSCOPY with BIOPSY and EXCISION VOLLECULAR CYST;  Surgeon: Ruby Cola, MD;  [facility];  Service: ENT;  Laterality: N/A;   ESOPHAGOGASTRODUODENOSCOPY (EGD) WITH PROPOFOL Left 06/10/2017   Procedure: ESOPHAGOGASTRODUODENOSCOPY (EGD) WITH PROPOFOL;  Surgeon: Ronnette Juniper, MD;  Location: [place];  Service: Gastroenterology;  Laterality: Left;   FLEXIBLE SIGMOIDOSCOPY Left 06/10/2017   Procedure: FLEXIBLE SIGMOIDOSCOPY;   Surgeon: Ronnette Juniper, MD;  Location: [place];  Service: Gastroenterology;  Laterality: Left;   LOOP RECORDER INSERTION N/A 06/20/2016   Procedure: Loop Recorder Insertion;  Surgeon: [name], MD;  Location: [place] CV LAB;  Service: Cardiovascular;  Laterality: N/A;   REFRACTIVE SURGERY Bilateral    both eyes   SPINE SURGERY     lumbar    Family History  Problem Relation Age of Onset   Cancer Mother    Heart disease Mother    Diabetes Father    Cancer Brother    Cancer Brother    Throat cancer Brother     Social History:  reports that she has been smoking cigarettes. She has a 30.00 pack-year smoking history. She has never used smokeless tobacco. She reports that she does not currently use alcohol. She reports that she does not currently use drugs after having used the following drugs: Methylphenidate.  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     HYPERLIPIDEMIA: The lipid abnormality consists of elevated LDL controlled with Lipitor 20 mg .   Her baseline LDL was 202  She is taking her atorvastatin regularly with good results .  Lab Results  Component Value Date   CHOL 157 08/10/2020   HDL 47.60 08/10/2020   LDLCALC 71 08/10/2020   TRIG 192.0 (H) 08/10/2020   CHOLHDL 3 08/10/2020       Last diabetic foot exam was done in 8/22 by podiatrist She has some vascular disease on the left side followed by vascular specialist  Has been taking gabapentin 12m twice daily for lower leg leg pains with relief Has numbness in feet, she says her family members check her feet regularly and she sees her podiatrist  also   She has had her Covid vaccines   Examination:   BP 140/60    Pulse 91    Ht _0  (1.6 m)    Wt 133 lb 3.2 oz (60.4 kg)    SpO2 99%    BMI 23.60 kg/m   Body mass index is 23.6 kg/m.    ASSESSMENT/ PLAN:     Diabetes type 2 on insulin See history of present illness for detailed discussion of current diabetes management, blood sugar patterns and problems identified  Her A1c is  6.3  She is on basal bolus insulin with Lantus and regular insulin  Again her freestyle lElenor Legatois variably accurate and is low compared to the fingerstick reading the office again Also on at least 1 occasion her freestyle lElenor Legatowas reading unusually low for a few hours However with reducing her insulin doses on the last visit she does not have as much time in range below 70 Has hypoglycemia unawareness Mostly has high readings  after breakfast especially with oatmeal  HYPERLIPIDEMIA: Will need follow-up lipids on the next visit  Peripheral neuropathy: Symptoms are controlled with gabapentin but she has some sensory loss  Recommendations: Reminded her to check his blood sugar at bedtime daily  She will ask her daughter to periodically check fingersticks to compare with the [facility]  Make sure she follows instructions that were given today for insulin doses Recommending adding more protein to breakfast when eating oatmeal Also to check some readings after breakfast also She will reduce Lantus to 8 units instead of 10 units No change in suppertime dose She will increase her morning regular insulin to 12 units when eating oatmeal only  Call if having persistently low readings  Continue gabapentin 2-3 times a day and follow-up with podiatrist regularly    There are no Patient Instructions on file for this visit.  Julie Brewer 02/08/2021, 11:09 AM     Note: This office note was prepared with Dragon voice recognition system technology. Any transcriptional errors that result from this process are  unintentional.

## 2021-02-08 NOTE — Patient Instructions (Addendum)
Take 12 regular insulin in am if NOT eating a meat  Must check sugar at bedtime daily  Lantus 8 instead of 10  Regular 6 at supper unless eating more Carbs

## 2021-02-09 ENCOUNTER — Other Ambulatory Visit: Payer: Self-pay | Admitting: Endocrinology

## 2021-02-09 DIAGNOSIS — Z794 Long term (current) use of insulin: Secondary | ICD-10-CM

## 2021-02-09 DIAGNOSIS — N2581 Secondary hyperparathyroidism of renal origin: Secondary | ICD-10-CM | POA: Diagnosis not present

## 2021-02-09 DIAGNOSIS — Z992 Dependence on renal dialysis: Secondary | ICD-10-CM | POA: Diagnosis not present

## 2021-02-09 DIAGNOSIS — N186 End stage renal disease: Secondary | ICD-10-CM | POA: Diagnosis not present

## 2021-02-09 DIAGNOSIS — E1129 Type 2 diabetes mellitus with other diabetic kidney complication: Secondary | ICD-10-CM | POA: Diagnosis not present

## 2021-02-09 DIAGNOSIS — E1165 Type 2 diabetes mellitus with hyperglycemia: Secondary | ICD-10-CM

## 2021-02-11 DIAGNOSIS — E1129 Type 2 diabetes mellitus with other diabetic kidney complication: Secondary | ICD-10-CM | POA: Diagnosis not present

## 2021-02-11 DIAGNOSIS — N186 End stage renal disease: Secondary | ICD-10-CM | POA: Diagnosis not present

## 2021-02-11 DIAGNOSIS — N2581 Secondary hyperparathyroidism of renal origin: Secondary | ICD-10-CM | POA: Diagnosis not present

## 2021-02-11 DIAGNOSIS — Z992 Dependence on renal dialysis: Secondary | ICD-10-CM | POA: Diagnosis not present

## 2021-02-14 DIAGNOSIS — Z992 Dependence on renal dialysis: Secondary | ICD-10-CM | POA: Diagnosis not present

## 2021-02-14 DIAGNOSIS — E1129 Type 2 diabetes mellitus with other diabetic kidney complication: Secondary | ICD-10-CM | POA: Diagnosis not present

## 2021-02-14 DIAGNOSIS — N186 End stage renal disease: Secondary | ICD-10-CM | POA: Diagnosis not present

## 2021-02-14 DIAGNOSIS — N2581 Secondary hyperparathyroidism of renal origin: Secondary | ICD-10-CM | POA: Diagnosis not present

## 2021-02-16 DIAGNOSIS — Z992 Dependence on renal dialysis: Secondary | ICD-10-CM | POA: Diagnosis not present

## 2021-02-16 DIAGNOSIS — N2581 Secondary hyperparathyroidism of renal origin: Secondary | ICD-10-CM | POA: Diagnosis not present

## 2021-02-16 DIAGNOSIS — E1129 Type 2 diabetes mellitus with other diabetic kidney complication: Secondary | ICD-10-CM | POA: Diagnosis not present

## 2021-02-16 DIAGNOSIS — N186 End stage renal disease: Secondary | ICD-10-CM | POA: Diagnosis not present

## 2021-02-18 DIAGNOSIS — Z992 Dependence on renal dialysis: Secondary | ICD-10-CM | POA: Diagnosis not present

## 2021-02-18 DIAGNOSIS — E1129 Type 2 diabetes mellitus with other diabetic kidney complication: Secondary | ICD-10-CM | POA: Diagnosis not present

## 2021-02-18 DIAGNOSIS — N186 End stage renal disease: Secondary | ICD-10-CM | POA: Diagnosis not present

## 2021-02-18 DIAGNOSIS — N2581 Secondary hyperparathyroidism of renal origin: Secondary | ICD-10-CM | POA: Diagnosis not present

## 2021-02-21 DIAGNOSIS — N2581 Secondary hyperparathyroidism of renal origin: Secondary | ICD-10-CM | POA: Diagnosis not present

## 2021-02-21 DIAGNOSIS — E1129 Type 2 diabetes mellitus with other diabetic kidney complication: Secondary | ICD-10-CM | POA: Diagnosis not present

## 2021-02-21 DIAGNOSIS — N186 End stage renal disease: Secondary | ICD-10-CM | POA: Diagnosis not present

## 2021-02-21 DIAGNOSIS — Z992 Dependence on renal dialysis: Secondary | ICD-10-CM | POA: Diagnosis not present

## 2021-02-23 DIAGNOSIS — Z992 Dependence on renal dialysis: Secondary | ICD-10-CM | POA: Diagnosis not present

## 2021-02-23 DIAGNOSIS — E1129 Type 2 diabetes mellitus with other diabetic kidney complication: Secondary | ICD-10-CM | POA: Diagnosis not present

## 2021-02-23 DIAGNOSIS — N186 End stage renal disease: Secondary | ICD-10-CM | POA: Diagnosis not present

## 2021-02-23 DIAGNOSIS — N2581 Secondary hyperparathyroidism of renal origin: Secondary | ICD-10-CM | POA: Diagnosis not present

## 2021-02-25 DIAGNOSIS — N186 End stage renal disease: Secondary | ICD-10-CM | POA: Diagnosis not present

## 2021-02-25 DIAGNOSIS — N2581 Secondary hyperparathyroidism of renal origin: Secondary | ICD-10-CM | POA: Diagnosis not present

## 2021-02-25 DIAGNOSIS — E1129 Type 2 diabetes mellitus with other diabetic kidney complication: Secondary | ICD-10-CM | POA: Diagnosis not present

## 2021-02-25 DIAGNOSIS — Z992 Dependence on renal dialysis: Secondary | ICD-10-CM | POA: Diagnosis not present

## 2021-02-28 DIAGNOSIS — E1129 Type 2 diabetes mellitus with other diabetic kidney complication: Secondary | ICD-10-CM | POA: Diagnosis not present

## 2021-02-28 DIAGNOSIS — N186 End stage renal disease: Secondary | ICD-10-CM | POA: Diagnosis not present

## 2021-02-28 DIAGNOSIS — N2581 Secondary hyperparathyroidism of renal origin: Secondary | ICD-10-CM | POA: Diagnosis not present

## 2021-02-28 DIAGNOSIS — Z992 Dependence on renal dialysis: Secondary | ICD-10-CM | POA: Diagnosis not present

## 2021-03-01 DIAGNOSIS — N186 End stage renal disease: Secondary | ICD-10-CM | POA: Diagnosis not present

## 2021-03-01 DIAGNOSIS — Z992 Dependence on renal dialysis: Secondary | ICD-10-CM | POA: Diagnosis not present

## 2021-03-01 DIAGNOSIS — E1122 Type 2 diabetes mellitus with diabetic chronic kidney disease: Secondary | ICD-10-CM | POA: Diagnosis not present

## 2021-03-02 DIAGNOSIS — N186 End stage renal disease: Secondary | ICD-10-CM | POA: Diagnosis not present

## 2021-03-02 DIAGNOSIS — Z992 Dependence on renal dialysis: Secondary | ICD-10-CM | POA: Diagnosis not present

## 2021-03-02 DIAGNOSIS — E1129 Type 2 diabetes mellitus with other diabetic kidney complication: Secondary | ICD-10-CM | POA: Diagnosis not present

## 2021-03-02 DIAGNOSIS — N2581 Secondary hyperparathyroidism of renal origin: Secondary | ICD-10-CM | POA: Diagnosis not present

## 2021-03-04 DIAGNOSIS — N186 End stage renal disease: Secondary | ICD-10-CM | POA: Diagnosis not present

## 2021-03-04 DIAGNOSIS — Z992 Dependence on renal dialysis: Secondary | ICD-10-CM | POA: Diagnosis not present

## 2021-03-04 DIAGNOSIS — N2581 Secondary hyperparathyroidism of renal origin: Secondary | ICD-10-CM | POA: Diagnosis not present

## 2021-03-04 DIAGNOSIS — E1129 Type 2 diabetes mellitus with other diabetic kidney complication: Secondary | ICD-10-CM | POA: Diagnosis not present

## 2021-03-07 DIAGNOSIS — E1129 Type 2 diabetes mellitus with other diabetic kidney complication: Secondary | ICD-10-CM | POA: Diagnosis not present

## 2021-03-07 DIAGNOSIS — N186 End stage renal disease: Secondary | ICD-10-CM | POA: Diagnosis not present

## 2021-03-07 DIAGNOSIS — N2581 Secondary hyperparathyroidism of renal origin: Secondary | ICD-10-CM | POA: Diagnosis not present

## 2021-03-07 DIAGNOSIS — Z992 Dependence on renal dialysis: Secondary | ICD-10-CM | POA: Diagnosis not present

## 2021-03-09 DIAGNOSIS — N186 End stage renal disease: Secondary | ICD-10-CM | POA: Diagnosis not present

## 2021-03-09 DIAGNOSIS — Z992 Dependence on renal dialysis: Secondary | ICD-10-CM | POA: Diagnosis not present

## 2021-03-09 DIAGNOSIS — N2581 Secondary hyperparathyroidism of renal origin: Secondary | ICD-10-CM | POA: Diagnosis not present

## 2021-03-09 DIAGNOSIS — E1129 Type 2 diabetes mellitus with other diabetic kidney complication: Secondary | ICD-10-CM | POA: Diagnosis not present

## 2021-03-10 ENCOUNTER — Encounter: Payer: Self-pay | Admitting: Endocrinology

## 2021-03-11 DIAGNOSIS — N2581 Secondary hyperparathyroidism of renal origin: Secondary | ICD-10-CM | POA: Diagnosis not present

## 2021-03-11 DIAGNOSIS — E1129 Type 2 diabetes mellitus with other diabetic kidney complication: Secondary | ICD-10-CM | POA: Diagnosis not present

## 2021-03-11 DIAGNOSIS — N186 End stage renal disease: Secondary | ICD-10-CM | POA: Diagnosis not present

## 2021-03-11 DIAGNOSIS — Z992 Dependence on renal dialysis: Secondary | ICD-10-CM | POA: Diagnosis not present

## 2021-03-14 DIAGNOSIS — E1129 Type 2 diabetes mellitus with other diabetic kidney complication: Secondary | ICD-10-CM | POA: Diagnosis not present

## 2021-03-14 DIAGNOSIS — N2581 Secondary hyperparathyroidism of renal origin: Secondary | ICD-10-CM | POA: Diagnosis not present

## 2021-03-14 DIAGNOSIS — Z992 Dependence on renal dialysis: Secondary | ICD-10-CM | POA: Diagnosis not present

## 2021-03-14 DIAGNOSIS — N186 End stage renal disease: Secondary | ICD-10-CM | POA: Diagnosis not present

## 2021-03-16 DIAGNOSIS — N186 End stage renal disease: Secondary | ICD-10-CM | POA: Diagnosis not present

## 2021-03-16 DIAGNOSIS — N2581 Secondary hyperparathyroidism of renal origin: Secondary | ICD-10-CM | POA: Diagnosis not present

## 2021-03-16 DIAGNOSIS — Z992 Dependence on renal dialysis: Secondary | ICD-10-CM | POA: Diagnosis not present

## 2021-03-16 DIAGNOSIS — E1129 Type 2 diabetes mellitus with other diabetic kidney complication: Secondary | ICD-10-CM | POA: Diagnosis not present

## 2021-03-18 ENCOUNTER — Other Ambulatory Visit: Payer: Self-pay | Admitting: Endocrinology

## 2021-03-18 DIAGNOSIS — E1129 Type 2 diabetes mellitus with other diabetic kidney complication: Secondary | ICD-10-CM | POA: Diagnosis not present

## 2021-03-18 DIAGNOSIS — N186 End stage renal disease: Secondary | ICD-10-CM | POA: Diagnosis not present

## 2021-03-18 DIAGNOSIS — N2581 Secondary hyperparathyroidism of renal origin: Secondary | ICD-10-CM | POA: Diagnosis not present

## 2021-03-18 DIAGNOSIS — Z992 Dependence on renal dialysis: Secondary | ICD-10-CM | POA: Diagnosis not present

## 2021-03-21 DIAGNOSIS — Z992 Dependence on renal dialysis: Secondary | ICD-10-CM | POA: Diagnosis not present

## 2021-03-21 DIAGNOSIS — E1129 Type 2 diabetes mellitus with other diabetic kidney complication: Secondary | ICD-10-CM | POA: Diagnosis not present

## 2021-03-21 DIAGNOSIS — N186 End stage renal disease: Secondary | ICD-10-CM | POA: Diagnosis not present

## 2021-03-21 DIAGNOSIS — N2581 Secondary hyperparathyroidism of renal origin: Secondary | ICD-10-CM | POA: Diagnosis not present

## 2021-03-23 DIAGNOSIS — Z992 Dependence on renal dialysis: Secondary | ICD-10-CM | POA: Diagnosis not present

## 2021-03-23 DIAGNOSIS — E1129 Type 2 diabetes mellitus with other diabetic kidney complication: Secondary | ICD-10-CM | POA: Diagnosis not present

## 2021-03-23 DIAGNOSIS — N186 End stage renal disease: Secondary | ICD-10-CM | POA: Diagnosis not present

## 2021-03-23 DIAGNOSIS — N2581 Secondary hyperparathyroidism of renal origin: Secondary | ICD-10-CM | POA: Diagnosis not present

## 2021-03-25 DIAGNOSIS — N2581 Secondary hyperparathyroidism of renal origin: Secondary | ICD-10-CM | POA: Diagnosis not present

## 2021-03-25 DIAGNOSIS — N186 End stage renal disease: Secondary | ICD-10-CM | POA: Diagnosis not present

## 2021-03-25 DIAGNOSIS — E1129 Type 2 diabetes mellitus with other diabetic kidney complication: Secondary | ICD-10-CM | POA: Diagnosis not present

## 2021-03-25 DIAGNOSIS — Z992 Dependence on renal dialysis: Secondary | ICD-10-CM | POA: Diagnosis not present

## 2021-03-28 DIAGNOSIS — Z992 Dependence on renal dialysis: Secondary | ICD-10-CM | POA: Diagnosis not present

## 2021-03-28 DIAGNOSIS — E1129 Type 2 diabetes mellitus with other diabetic kidney complication: Secondary | ICD-10-CM | POA: Diagnosis not present

## 2021-03-28 DIAGNOSIS — N2581 Secondary hyperparathyroidism of renal origin: Secondary | ICD-10-CM | POA: Diagnosis not present

## 2021-03-28 DIAGNOSIS — N186 End stage renal disease: Secondary | ICD-10-CM | POA: Diagnosis not present

## 2021-03-29 ENCOUNTER — Telehealth: Payer: Self-pay | Admitting: Neurology

## 2021-03-29 DIAGNOSIS — E1122 Type 2 diabetes mellitus with diabetic chronic kidney disease: Secondary | ICD-10-CM | POA: Diagnosis not present

## 2021-03-29 DIAGNOSIS — Z992 Dependence on renal dialysis: Secondary | ICD-10-CM | POA: Diagnosis not present

## 2021-03-29 DIAGNOSIS — N186 End stage renal disease: Secondary | ICD-10-CM | POA: Diagnosis not present

## 2021-03-29 NOTE — Telephone Encounter (Signed)
Patient's daughter [name] called and left a voice mail to report a seizure the patient had yesterday at about 4:15 PM and another one today about 11:45 AM. ? ?She is complaining about her head hurting after having them. ? ?She'd like to move her mom's appointment to sooner and to speak with a clinical staff member. ?

## 2021-03-29 NOTE — Telephone Encounter (Signed)
Pt c/o: seizure ?Missed medications?  No. ?Sleep deprived?  No. ?Alcohol intake?  No. ?Back to their usual baseline self?  Yes.  . If no, advise go to ER ?Any trigger? No . Was out side talking to her daughter when it happened, has a bad headache with it,  ?Any stress? No increase in stress ?Current medications prescribed by Dr. Delice Lesch:  lamotrigine 100mg   Take 1 tablet (100 mg total) by mouth 2 (two) times daily ?

## 2021-03-30 DIAGNOSIS — N2581 Secondary hyperparathyroidism of renal origin: Secondary | ICD-10-CM | POA: Diagnosis not present

## 2021-03-30 DIAGNOSIS — Z992 Dependence on renal dialysis: Secondary | ICD-10-CM | POA: Diagnosis not present

## 2021-03-30 DIAGNOSIS — N186 End stage renal disease: Secondary | ICD-10-CM | POA: Diagnosis not present

## 2021-03-30 NOTE — Telephone Encounter (Signed)
I have an opening tomorrow March 3 at 10am, thanks ?

## 2021-03-30 NOTE — Telephone Encounter (Signed)
Spoke with patient's daughter [name] and patient on schedule for 03/31/21 at 10am. ?

## 2021-03-31 ENCOUNTER — Ambulatory Visit (INDEPENDENT_AMBULATORY_CARE_PROVIDER_SITE_OTHER): Payer: Medicare Other | Admitting: Neurology

## 2021-03-31 ENCOUNTER — Other Ambulatory Visit: Payer: Self-pay | Admitting: Endocrinology

## 2021-03-31 ENCOUNTER — Encounter: Payer: Self-pay | Admitting: Neurology

## 2021-03-31 ENCOUNTER — Other Ambulatory Visit: Payer: Self-pay

## 2021-03-31 ENCOUNTER — Other Ambulatory Visit (INDEPENDENT_AMBULATORY_CARE_PROVIDER_SITE_OTHER): Payer: Medicare Other

## 2021-03-31 VITALS — BP 136/68 | HR 99 | Ht 63.0 in | Wt 130.0 lb

## 2021-03-31 DIAGNOSIS — R55 Syncope and collapse: Secondary | ICD-10-CM

## 2021-03-31 DIAGNOSIS — R404 Transient alteration of awareness: Secondary | ICD-10-CM

## 2021-03-31 MED ORDER — LAMOTRIGINE 200 MG PO TABS: ORAL_TABLET | ORAL | 3 refills | Status: DC

## 2021-03-31 NOTE — Progress Notes (Signed)
NEUROLOGY FOLLOW UP OFFICE NOTE  ZORIANNA Brewer [id number] August 06, 1950  HISTORY OF PRESENT ILLNESS: I had the pleasure of seeing Julie Brewer in follow-up in the neurology clinic on 03/31/2021.  The patient was last seen 9 months ago for recurrent episodes of loss of consciousness concerning for seizures. She is again accompanied by her daughter Julie Brewer who helps supplement the history today.  Records and images were personally reviewed where available. MRI brain and 24-hour EEG in 2022 were unremarkable. Her daughter called on 3/1 to report that she had a seizure on 2/28 and another one on 3/1. On 2/28, they were outside talking, she was sitting when she started going to the side with right hand trembling really bad. She was making the funny sound, this lasted 7-8 minutes. No tongue bite. This again occurred after HD. She had another unwitnessed episode on 3/1, she was alone on her recliner, no warning, she was asleep and woke up nauseated with dry heaving. She did not try to stand because her legs don't cooperate after a seizure, no focal weakness. She was complaining of headache after. Headaches are always on the right side after a seizure. She is worried the right side of her head feels bigger than the left side. No recent head injuries or falls. They report having around one seizure a month, one time at the beginning of January, she had one at the table that was similar to other seizures. She has been on Lamotrigine 160m BID without side effects. She denies missing any doses. They state that she is blind, she knows her medications by the feel of them and manages her own medications, having her own system with where the pill bottles go and knowing how to take them. She denies any change in sleep patterns, "I don't sleep good anyway."    History on Initial Assessment 02/05/2020: This is a pleasant 71 year old right-handed woman with a history of hypertension, hyperlipidemia, diabetes, CKD on  dialysis, legally blind, stroke in 2010 with residual mild left-sided weakness, presenting for evaluation of recurrent episodes of loss of consciousness. The episodes started around 2-3 years ago. There is no prior warning, she notes that she is eating when they happen most of the time. She has been living with her daughter [name] March 2021, and they report episodes around once a month, however she had 2 in December, last episode was 01/23/20. [name] reports they occur either when eating breakfast or dinner, she would make a noise like a growl, then starts falling over to the right side. She is staring with eyes open, not responding, and would be out for a minute. No jerking/convulsive activity seen. She would be a little disoriented and clammy after. She feels tired and would feel the urge to have a bowel movement. She has had urinary incontinence with some of the episodes. They report a bigger episode in January 2021 when she was more confused and did not recognize her family after, [facility] was note of episodes of hypoglycemia in the past, she had a loop recorder due to these episodes which was normal on interrogation. She denies any olfactory/gustatory hallucinations, deja vu, rising epigastric sensation, focal numbness/tingling/weakness, myoclonic jerks. She denies any significant headaches, dizziness, dysarthria/dysphagia. She lost her vision in 2011. She has dialysis T-Th-F. Her mother used to have seizures and was found to have a brain tumor. Otherwise she had a normal birth and early development.  There is no history of  febrile convulsions, CNS infections such as meningitis/encephalitis, significant traumatic brain injury, neurosurgical procedures.  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   Her routine and 24-hour EEGs in  January/February 2022 were normal, EKG showed irregular rhythm, typical events were not captured.   She has had a loop recorder since 2018 with no changes during events.   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   CHF (congestive heart failure) (HCC)    Chronic kidney disease    Tu/Th/Sa   Diabetes mellitus    Type II per Dr [name]  (patient said type I)   Fibroid    GERD (gastroesophageal reflux disease)    Glaucoma    Hyperlipidemia    Hypertension    Iron deficiency anemia 03/09/2016   Peripheral vascular disease ([place])    Pneumonia 2006   PONV (postoperative nausea and vomiting)    Shortness of breath dyspnea    with exdrtion, "Walkling too fast"   Stroke ([place])    no residual    MEDICATIONS: Current Outpatient Medications on File Prior to Visit  Medication Sig Dispense Refill   acetaminophen (TYLENOL) 325 MG tablet Take 2 tablets (650 mg total) by mouth every 6 (six) hours as needed for mild pain (or Fever >/= 101).     amLODipine (NORVASC) 10 MG tablet SMARTSIG:.5 Tablet(s) By Mouth Daily     aspirin 325 MG EC tablet Take 325 mg by mouth daily.      atorvastatin (LIPITOR) 20 MG tablet Take 1 tablet by mouth once daily 90 tablet 0   bisacodyl (DULCOLAX) 5 MG EC tablet Take 5 mg by mouth daily as needed for moderate constipation.     Blood Glucose Monitoring Suppl (FREESTYLE FREEDOM) KIT Use to check blood sugar once a day dx code 1 kit 1   cilostazol (PLETAL) 100 MG tablet Take 1 tablet by mouth twice daily 180 tablet 0   docusate sodium (COLACE) 100 MG capsule Take 200 mg by mouth daily.      Doxercalciferol (HECTOROL IV) Doxercalciferol (Hectorol)     ferrous sulfate 325 (65 FE) MG tablet Take 650 mg by mouth daily.     gabapentin (NEURONTIN) 100 MG capsule TAKE 1 CAPSULE BY MOUTH TWICE DAILY AS NEEDED 180 capsule 0   glucose  blood (FREESTYLE LITE) test strip USE AS INSTRUCTED TO CHECK BLOOD SUGAR ONCE DAILY. 50 each 3   heparin 1000 unit/mL SOLN injection Heparin Sodium (Porcine) 1,000 Units/mL Systemic     insulin glargine (LANTUS) 100 UNIT/ML injection INJECT 16 UNITS SUBCUTANEOUSLY ONCE DAILY IN THE MORNING (Patient taking differently: INJECT 12-14 UNITS SUBCUTANEOUSLY ONCE DAILY IN THE MORNING) 10 mL 0   Insulin Pen Needle 31G X 5 MM MISC Use with pen 100 each 1   insulin regular (NOVOLIN R) 100 units/mL injection Inject into the skin 2 (two) times daily before a meal. Inject 10 units under the skin before breakfast and 5-7 units before dinner.     iron sucrose in sodium chloride 0.9 % 100 mL Inject into the vein.     lamoTRIgine (LAMICTAL) 100 MG tablet Take 1 tablet (100 mg total) by mouth 2 (two) times daily. 180 tablet 3   lidocaine-prilocaine (EMLA)  cream SMARTSIG:Sparingly Topical     Methoxy PEG-Epoetin Beta (MIRCERA IJ) Mircera     omeprazole (PRILOSEC) 20 MG capsule Take 1 capsule by mouth once daily 90 capsule 0   triamcinolone ointment (KENALOG) 0.1 % SMARTSIG:Liberally Topical Twice Daily     Vitamin D, Ergocalciferol, (DRISDOL) 1.25 MG (50000 UNIT) CAPS capsule Take 1 capsule by mouth once a week 12 capsule 0   sevelamer carbonate (RENVELA) 800 MG tablet Take by mouth. (Patient not taking: Reported on 03/31/2021)     No current facility-administered medications on file prior to visit.    ALLERGIES: Allergies  Allergen Reactions   Morphine And Related Other (See Comments)    Hallucinations    Penicillins Swelling and Rash    Throat swelling Did it involve swelling of the face/tongue/throat, SOB, or low BP? Yes Did it involve sudden or severe rash/hives, skin peeling, or any reaction on the inside of your mouth or nose? Yes Did you need to seek medical attention at a hospital or doctor's office? Yes When did it last happen?   young child    If all above answers are NO, may proceed with  cephalosporin use.    FAMILY HISTORY: Family History  Problem Relation Age of Onset   Cancer Mother    Heart disease Mother    Diabetes Father    Cancer Brother    Cancer Brother    Throat cancer Brother     SOCIAL HISTORY: Social History   Socioeconomic History   Marital status: Legally Separated    Spouse name: Not on file   Number of children: Not on file   Years of education: Not on file   Highest education level: Not on file  Occupational History   Not on file  Tobacco Use   Smoking status: Every Day    Packs/day: 1.00    Years: 30.00    Pack years: 30.00    Types: Cigarettes   Smokeless tobacco: Never  Vaping Use   Vaping Use: Never used  Substance and Sexual Activity   Alcohol use: Not Currently    Comment: socially   Drug use: Not Currently    Types: Methylphenidate   Sexual activity: Never    Birth control/protection: Post-menopausal, Surgical    Comment: Hysterectomy  Other Topics Concern   Not on file  Social History Narrative   Right handed   Lives with daughter in a one story home   Drinks caffeine   Social Determinants of Health   Financial Resource Strain: Not on file  Food Insecurity: Not on file  Transportation Needs: Not on file  Physical Activity: Not on file  Stress: Not on file  Social Connections: Not on file  Intimate Partner Violence: Not on file     PHYSICAL EXAM: Vitals:   03/31/21 0951  BP: 136/68  Pulse: 99  SpO2: 97%   General: No acute distress Head:  Normocephalic/atraumatic Skin/Extremities: No rash, no edema Neurological Exam: alert and awake. No aphasia or dysarthria. Fund of knowledge is appropriate.  Attention and concentration are normal.   Cranial nerves: Pupils equal, round.no light perception on both. Extraocular movements intact with no nystagmus. No facial asymmetry.  Motor: Bulk and tone normal, muscle strength 5/5 throughout with no pronator drift.   Finger to nose testing intact.  Gait slow and  cautious, no ataxia   IMPRESSION: This is a pleasant 71 yo RH woman with a history of hypertension, hyperlipidemia, diabetes, CKD on dialysis, legally blind, stroke in  2010 with residual mild left-sided weakness, with recurrent episodes of loss of consciousness where she is described as making a gurgling sound, staring, unresponsive for a minute, followed by confusion. Her daughter reports right arm shaking today. She has had an unremarkable cardiac evaluation and has had a loop recorder since 2018, captured episodes have not shown any significant arrhythmia. Her brain MRI and 24-hour EEG were unremarkable. She continues to have episodes monthly, and had 2 back to back recently. She denies missing doses of Lamotrigine, I am unsure about compliance with her medications due to vision loss and managing own medications without any assistance, check lamictal level. We discussed increasing Lamotrigine to 242m BID. Keep a calendar of events, majority of episodes have occurred on afternoons after her dialysis, hypotension is still a possibility. If no change in frequency with increase in Lamotrigine dose, would do inpatient video EEG monitoring for characterization. They will call for an update in 4 months, follow-up with me in 5 months, call for any changes.   Thank you for allowing me to participate in her care.  Please do not hesitate to call for any questions or concerns.    KEllouise Newer M.D.   CC: Dr. TLindell Noe

## 2021-03-31 NOTE — Patient Instructions (Signed)
Good to see you. ? ?Bloodwork for Lamictal level ? ?2. Increase Lamotrigine to 200mg : Take 1 tablet twice a day ? ?3. Keep a calendar of the seizures, if no change with increase in dose over the next 4 months, call our office and we will plan for inpatient video EEG study ? ?4. Follow-up in 5 months, call for any changes ? ? ?Seizure Precautions: ?1. If medication has been prescribed for you to prevent seizures, take it exactly as directed.  Do not stop taking the medicine without talking to your doctor first, even if you have not had a seizure in a long time.  ? ?2. Avoid activities in which a seizure would cause danger to yourself or to others.  Don't operate dangerous machinery, swim alone, or climb in high or dangerous places, such as on ladders, roofs, or girders.  Do not drive unless your doctor says you may. ? ?3. If you have any warning that you may have a seizure, lay down in a safe place where you can't hurt yourself.   ? ?4.  No driving for 6 months from last seizure, as per [facility].   Please refer to the following link on the [facility] website for more information: [email or web address]  ? ?5.  Maintain good sleep hygiene. ? ?6.  Contact your doctor if you have any problems that may be related to the medicine you are taking. ? ?7.  Call 911 and bring the patient back to the ED if: ?      ? A.  The seizure lasts longer than 5 minutes.      ? B.  The patient doesn't awaken shortly after the seizure ? C.  The patient has new problems such as difficulty seeing, speaking or moving ? D.  The patient was injured during the seizure ? E.  The patient has a temperature over 102 F (39C) ? F.  The patient vomited and now is having trouble breathing ?      ? ?

## 2021-04-01 DIAGNOSIS — Z992 Dependence on renal dialysis: Secondary | ICD-10-CM | POA: Diagnosis not present

## 2021-04-01 DIAGNOSIS — N186 End stage renal disease: Secondary | ICD-10-CM | POA: Diagnosis not present

## 2021-04-01 DIAGNOSIS — N2581 Secondary hyperparathyroidism of renal origin: Secondary | ICD-10-CM | POA: Diagnosis not present

## 2021-04-03 LAB — LAMOTRIGINE LEVEL: Lamotrigine Lvl: 8.2 ug/mL (ref 4.0–18.0)

## 2021-04-04 DIAGNOSIS — N186 End stage renal disease: Secondary | ICD-10-CM | POA: Diagnosis not present

## 2021-04-04 DIAGNOSIS — N2581 Secondary hyperparathyroidism of renal origin: Secondary | ICD-10-CM | POA: Diagnosis not present

## 2021-04-04 DIAGNOSIS — Z992 Dependence on renal dialysis: Secondary | ICD-10-CM | POA: Diagnosis not present

## 2021-04-04 NOTE — Progress Notes (Signed)
Patient advised of results, voiced understanding.   ?

## 2021-04-06 DIAGNOSIS — N186 End stage renal disease: Secondary | ICD-10-CM | POA: Diagnosis not present

## 2021-04-06 DIAGNOSIS — N2581 Secondary hyperparathyroidism of renal origin: Secondary | ICD-10-CM | POA: Diagnosis not present

## 2021-04-06 DIAGNOSIS — Z992 Dependence on renal dialysis: Secondary | ICD-10-CM | POA: Diagnosis not present

## 2021-04-07 ENCOUNTER — Emergency Department (HOSPITAL_COMMUNITY): Payer: Medicare Other

## 2021-04-07 ENCOUNTER — Inpatient Hospital Stay (HOSPITAL_COMMUNITY)
Admission: EM | Admit: 2021-04-07 | Discharge: 2021-04-13 | DRG: 100 | Disposition: A | Discharge status: Home / Self Care | Payer: Medicare Other | Attending: Internal Medicine | Admitting: Internal Medicine

## 2021-04-07 ENCOUNTER — Telehealth: Payer: Self-pay | Admitting: Neurology

## 2021-04-07 ENCOUNTER — Other Ambulatory Visit: Payer: Self-pay

## 2021-04-07 ENCOUNTER — Observation Stay (HOSPITAL_COMMUNITY): Payer: Medicare Other

## 2021-04-07 ENCOUNTER — Encounter (HOSPITAL_COMMUNITY): Payer: Self-pay | Admitting: Emergency Medicine

## 2021-04-07 DIAGNOSIS — E1165 Type 2 diabetes mellitus with hyperglycemia: Secondary | ICD-10-CM | POA: Diagnosis not present

## 2021-04-07 DIAGNOSIS — K264 Chronic or unspecified duodenal ulcer with hemorrhage: Secondary | ICD-10-CM | POA: Diagnosis present

## 2021-04-07 DIAGNOSIS — R0902 Hypoxemia: Secondary | ICD-10-CM | POA: Diagnosis not present

## 2021-04-07 DIAGNOSIS — Z20822 Contact with and (suspected) exposure to covid-19: Secondary | ICD-10-CM | POA: Diagnosis present

## 2021-04-07 DIAGNOSIS — N186 End stage renal disease: Secondary | ICD-10-CM | POA: Diagnosis present

## 2021-04-07 DIAGNOSIS — R159 Full incontinence of feces: Secondary | ICD-10-CM | POA: Diagnosis present

## 2021-04-07 DIAGNOSIS — E876 Hypokalemia: Secondary | ICD-10-CM | POA: Diagnosis present

## 2021-04-07 DIAGNOSIS — I132 Hypertensive heart and chronic kidney disease with heart failure and with stage 5 chronic kidney disease, or end stage renal disease: Secondary | ICD-10-CM | POA: Diagnosis present

## 2021-04-07 DIAGNOSIS — I739 Peripheral vascular disease, unspecified: Secondary | ICD-10-CM | POA: Diagnosis not present

## 2021-04-07 DIAGNOSIS — R32 Unspecified urinary incontinence: Secondary | ICD-10-CM | POA: Diagnosis present

## 2021-04-07 DIAGNOSIS — E1122 Type 2 diabetes mellitus with diabetic chronic kidney disease: Secondary | ICD-10-CM | POA: Diagnosis not present

## 2021-04-07 DIAGNOSIS — N250 Renal osteodystrophy: Secondary | ICD-10-CM | POA: Diagnosis not present

## 2021-04-07 DIAGNOSIS — K2091 Esophagitis, unspecified with bleeding: Secondary | ICD-10-CM | POA: Diagnosis not present

## 2021-04-07 DIAGNOSIS — Z79899 Other long term (current) drug therapy: Secondary | ICD-10-CM

## 2021-04-07 DIAGNOSIS — F17210 Nicotine dependence, cigarettes, uncomplicated: Secondary | ICD-10-CM | POA: Diagnosis present

## 2021-04-07 DIAGNOSIS — H409 Unspecified glaucoma: Secondary | ICD-10-CM | POA: Diagnosis present

## 2021-04-07 DIAGNOSIS — K2970 Gastritis, unspecified, without bleeding: Secondary | ICD-10-CM | POA: Diagnosis present

## 2021-04-07 DIAGNOSIS — K3189 Other diseases of stomach and duodenum: Secondary | ICD-10-CM | POA: Diagnosis not present

## 2021-04-07 DIAGNOSIS — K31811 Angiodysplasia of stomach and duodenum with bleeding: Secondary | ICD-10-CM | POA: Diagnosis not present

## 2021-04-07 DIAGNOSIS — D62 Acute posthemorrhagic anemia: Secondary | ICD-10-CM | POA: Diagnosis present

## 2021-04-07 DIAGNOSIS — E785 Hyperlipidemia, unspecified: Secondary | ICD-10-CM | POA: Diagnosis present

## 2021-04-07 DIAGNOSIS — I959 Hypotension, unspecified: Secondary | ICD-10-CM | POA: Diagnosis not present

## 2021-04-07 DIAGNOSIS — K2100 Gastro-esophageal reflux disease with esophagitis, without bleeding: Secondary | ICD-10-CM | POA: Diagnosis present

## 2021-04-07 DIAGNOSIS — Z8673 Personal history of transient ischemic attack (TIA), and cerebral infarction without residual deficits: Secondary | ICD-10-CM

## 2021-04-07 DIAGNOSIS — Z743 Need for continuous supervision: Secondary | ICD-10-CM | POA: Diagnosis not present

## 2021-04-07 DIAGNOSIS — I120 Hypertensive chronic kidney disease with stage 5 chronic kidney disease or end stage renal disease: Secondary | ICD-10-CM | POA: Diagnosis not present

## 2021-04-07 DIAGNOSIS — Z90710 Acquired absence of both cervix and uterus: Secondary | ICD-10-CM

## 2021-04-07 DIAGNOSIS — R569 Unspecified convulsions: Principal | ICD-10-CM

## 2021-04-07 DIAGNOSIS — R4182 Altered mental status, unspecified: Secondary | ICD-10-CM | POA: Diagnosis not present

## 2021-04-07 DIAGNOSIS — R9431 Abnormal electrocardiogram [ECG] [EKG]: Secondary | ICD-10-CM | POA: Diagnosis not present

## 2021-04-07 DIAGNOSIS — E889 Metabolic disorder, unspecified: Secondary | ICD-10-CM | POA: Diagnosis present

## 2021-04-07 DIAGNOSIS — H548 Legal blindness, as defined in USA: Secondary | ICD-10-CM | POA: Diagnosis present

## 2021-04-07 DIAGNOSIS — Z833 Family history of diabetes mellitus: Secondary | ICD-10-CM

## 2021-04-07 DIAGNOSIS — D631 Anemia in chronic kidney disease: Secondary | ICD-10-CM | POA: Diagnosis present

## 2021-04-07 DIAGNOSIS — G40409 Other generalized epilepsy and epileptic syndromes, not intractable, without status epilepticus: Secondary | ICD-10-CM | POA: Diagnosis present

## 2021-04-07 DIAGNOSIS — D509 Iron deficiency anemia, unspecified: Secondary | ICD-10-CM | POA: Diagnosis present

## 2021-04-07 DIAGNOSIS — E1129 Type 2 diabetes mellitus with other diabetic kidney complication: Secondary | ICD-10-CM | POA: Diagnosis not present

## 2021-04-07 DIAGNOSIS — Z7982 Long term (current) use of aspirin: Secondary | ICD-10-CM

## 2021-04-07 DIAGNOSIS — K921 Melena: Secondary | ICD-10-CM | POA: Diagnosis not present

## 2021-04-07 DIAGNOSIS — Z66 Do not resuscitate: Secondary | ICD-10-CM | POA: Diagnosis present

## 2021-04-07 DIAGNOSIS — Z8249 Family history of ischemic heart disease and other diseases of the circulatory system: Secondary | ICD-10-CM

## 2021-04-07 DIAGNOSIS — Z880 Allergy status to penicillin: Secondary | ICD-10-CM

## 2021-04-07 DIAGNOSIS — E1151 Type 2 diabetes mellitus with diabetic peripheral angiopathy without gangrene: Secondary | ICD-10-CM | POA: Diagnosis not present

## 2021-04-07 DIAGNOSIS — Z885 Allergy status to narcotic agent status: Secondary | ICD-10-CM

## 2021-04-07 DIAGNOSIS — Z8701 Personal history of pneumonia (recurrent): Secondary | ICD-10-CM

## 2021-04-07 DIAGNOSIS — Z635 Disruption of family by separation and divorce: Secondary | ICD-10-CM

## 2021-04-07 DIAGNOSIS — Z794 Long term (current) use of insulin: Secondary | ICD-10-CM

## 2021-04-07 DIAGNOSIS — K209 Esophagitis, unspecified without bleeding: Secondary | ICD-10-CM | POA: Diagnosis not present

## 2021-04-07 DIAGNOSIS — I509 Heart failure, unspecified: Secondary | ICD-10-CM | POA: Diagnosis not present

## 2021-04-07 DIAGNOSIS — K922 Gastrointestinal hemorrhage, unspecified: Secondary | ICD-10-CM | POA: Diagnosis not present

## 2021-04-07 DIAGNOSIS — I5032 Chronic diastolic (congestive) heart failure: Secondary | ICD-10-CM | POA: Diagnosis present

## 2021-04-07 DIAGNOSIS — Z992 Dependence on renal dialysis: Secondary | ICD-10-CM

## 2021-04-07 DIAGNOSIS — I10 Essential (primary) hypertension: Secondary | ICD-10-CM | POA: Diagnosis not present

## 2021-04-07 DIAGNOSIS — K2971 Gastritis, unspecified, with bleeding: Secondary | ICD-10-CM | POA: Diagnosis not present

## 2021-04-07 DIAGNOSIS — G40909 Epilepsy, unspecified, not intractable, without status epilepticus: Secondary | ICD-10-CM

## 2021-04-07 LAB — COMPREHENSIVE METABOLIC PANEL
ALT: 13 U/L (ref 0–44)
AST: 14 U/L — ABNORMAL LOW (ref 15–41)
Albumin: 3.4 g/dL — ABNORMAL LOW (ref 3.5–5.0)
Alkaline Phosphatase: 78 U/L (ref 38–126)
Anion gap: 12 (ref 5–15)
BUN: 11 mg/dL (ref 8–23)
CO2: 30 mmol/L (ref 22–32)
Calcium: 9.5 mg/dL (ref 8.9–10.3)
Chloride: 94 mmol/L — ABNORMAL LOW (ref 98–111)
Creatinine, Ser: 4.19 mg/dL — ABNORMAL HIGH (ref 0.44–1.00)
GFR, Estimated: 11 mL/min — ABNORMAL LOW (ref >60)
Glucose, Bld: 200 mg/dL — ABNORMAL HIGH (ref 70–99)
Potassium: 3.2 mmol/L — ABNORMAL LOW (ref 3.5–5.1)
Sodium: 136 mmol/L (ref 135–145)
Total Bilirubin: 0.4 mg/dL (ref 0.3–1.2)
Total Protein: 6.2 g/dL — ABNORMAL LOW (ref 6.5–8.1)

## 2021-04-07 LAB — CBC WITH DIFFERENTIAL/PLATELET
Abs Immature Granulocytes: 0.06 10*3/uL (ref 0.00–0.07)
Basophils Absolute: 0.1 10*3/uL (ref 0.0–0.1)
Basophils Relative: 1 %
Eosinophils Absolute: 0.2 10*3/uL (ref 0.0–0.5)
Eosinophils Relative: 3 %
HCT: 32.3 % — ABNORMAL LOW (ref 36.0–46.0)
Hemoglobin: 10.3 g/dL — ABNORMAL LOW (ref 12.0–15.0)
Immature Granulocytes: 1 %
Lymphocytes Relative: 11 %
Lymphs Abs: 1 10*3/uL (ref 0.7–4.0)
MCH: 27.2 pg (ref 26.0–34.0)
MCHC: 31.9 g/dL (ref 30.0–36.0)
MCV: 85.2 fL (ref 80.0–100.0)
Monocytes Absolute: 0.7 10*3/uL (ref 0.1–1.0)
Monocytes Relative: 8 %
Neutro Abs: 6.5 10*3/uL (ref 1.7–7.7)
Neutrophils Relative %: 76 %
Platelets: 235 10*3/uL (ref 150–400)
RBC: 3.79 MIL/uL — ABNORMAL LOW (ref 3.87–5.11)
RDW: 17.3 % — ABNORMAL HIGH (ref 11.5–15.5)
WBC: 8.5 10*3/uL (ref 4.0–10.5)
nRBC: 0 % (ref 0.0–0.2)

## 2021-04-07 LAB — RESP PANEL BY RT-PCR (FLU A&B, COVID) ARPGX2
Influenza A by PCR: NEGATIVE
Influenza B by PCR: NEGATIVE
SARS Coronavirus 2 by RT PCR: NEGATIVE

## 2021-04-07 LAB — LACTIC ACID, PLASMA
Lactic Acid, Venous: 2.7 mmol/L (ref 0.5–1.9)
Lactic Acid, Venous: 2 mmol/L (ref 0.5–1.9)

## 2021-04-07 LAB — HIV ANTIBODY (ROUTINE TESTING W REFLEX): HIV Screen 4th Generation wRfx: NONREACTIVE

## 2021-04-07 LAB — GLUCOSE, CAPILLARY: Glucose-Capillary: 194 mg/dL — ABNORMAL HIGH (ref 70–99)

## 2021-04-07 LAB — ETHANOL: Alcohol, Ethyl (B): <10 mg/dL (ref <10)

## 2021-04-07 LAB — CBG MONITORING, ED: Glucose-Capillary: 100 mg/dL — ABNORMAL HIGH (ref 70–99)

## 2021-04-07 MED ORDER — SODIUM CHLORIDE 0.9 % IV BOLUS
500.0000 mL | Freq: Once | INTRAVENOUS | Status: AC
  Administered 2021-04-07: 500 mL via INTRAVENOUS

## 2021-04-07 MED ORDER — ATORVASTATIN CALCIUM 10 MG PO TABS
20.0000 mg | ORAL_TABLET | Freq: Every day | ORAL | Status: DC
  Administered 2021-04-08 – 2021-04-13 (×5): 20 mg via ORAL
  Filled 2021-04-07 (×6): qty 2

## 2021-04-07 MED ORDER — INSULIN GLARGINE-YFGN 100 UNIT/ML ~~LOC~~ SOLN: 14.0000 [IU] | Freq: Every day | SUBCUTANEOUS | Status: DC

## 2021-04-07 MED ORDER — BISACODYL 5 MG PO TBEC: 5.0000 mg | DELAYED_RELEASE_TABLET | Freq: Every day | ORAL | Status: DC | PRN

## 2021-04-07 MED ORDER — INSULIN ASPART 100 UNIT/ML IJ SOLN: 0.0000 [IU] | Freq: Every day | INTRAMUSCULAR | Status: DC

## 2021-04-07 MED ORDER — ASPIRIN EC 325 MG PO TBEC
325.0000 mg | DELAYED_RELEASE_TABLET | Freq: Every day | ORAL | Status: DC
  Administered 2021-04-08: 325 mg via ORAL
  Filled 2021-04-07: qty 1

## 2021-04-07 MED ORDER — ORAL CARE MOUTH RINSE
15.0000 mL | OROMUCOSAL | Status: DC
  Administered 2021-04-09 – 2021-04-13 (×9): 15 mL via OROMUCOSAL

## 2021-04-07 MED ORDER — ORAL CARE MOUTH RINSE: 15.0000 mL | OROMUCOSAL | Status: DC

## 2021-04-07 MED ORDER — INSULIN GLARGINE-YFGN 100 UNIT/ML ~~LOC~~ SOLN
7.0000 [IU] | Freq: Every day | SUBCUTANEOUS | Status: DC
  Administered 2021-04-08 – 2021-04-11 (×3): 7 [IU] via SUBCUTANEOUS
  Filled 2021-04-07 (×4): qty 0.07

## 2021-04-07 MED ORDER — CHLORHEXIDINE GLUCONATE 0.12% ORAL RINSE (MEDLINE KIT)
15.0000 mL | Freq: Two times a day (BID) | OROMUCOSAL | Status: DC
  Administered 2021-04-08 – 2021-04-13 (×6): 15 mL via OROMUCOSAL

## 2021-04-07 MED ORDER — INSULIN GLARGINE-YFGN 100 UNIT/ML ~~LOC~~ SOLN
14.0000 [IU] | Freq: Every day | SUBCUTANEOUS | Status: DC
  Filled 2021-04-07: qty 0.14

## 2021-04-07 MED ORDER — DOCUSATE SODIUM 100 MG PO CAPS
200.0000 mg | ORAL_CAPSULE | Freq: Every day | ORAL | Status: DC
  Administered 2021-04-07 – 2021-04-13 (×2): 200 mg via ORAL
  Filled 2021-04-07 (×7): qty 2

## 2021-04-07 MED ORDER — POTASSIUM CHLORIDE CRYS ER 20 MEQ PO TBCR
20.0000 meq | EXTENDED_RELEASE_TABLET | Freq: Once | ORAL | Status: AC
  Administered 2021-04-07: 20 meq via ORAL
  Filled 2021-04-07: qty 1

## 2021-04-07 MED ORDER — CHLORHEXIDINE GLUCONATE CLOTH 2 % EX PADS
6.0000 | MEDICATED_PAD | Freq: Every day | CUTANEOUS | Status: DC
  Administered 2021-04-08 – 2021-04-12 (×5): 6 via TOPICAL

## 2021-04-07 MED ORDER — PANTOPRAZOLE SODIUM 40 MG PO TBEC
40.0000 mg | DELAYED_RELEASE_TABLET | Freq: Every day | ORAL | Status: DC
Start: 2021-04-07 — End: 2021-04-09
  Administered 2021-04-07 – 2021-04-08 (×2): 40 mg via ORAL
  Filled 2021-04-07 (×2): qty 1

## 2021-04-07 MED ORDER — SODIUM CHLORIDE 0.9 % IV SOLN
75.0000 mL/h | INTRAVENOUS | Status: DC
  Administered 2021-04-07 – 2021-04-08 (×2): 75 mL/h via INTRAVENOUS

## 2021-04-07 MED ORDER — HEPARIN SODIUM (PORCINE) 5000 UNIT/ML IJ SOLN
5000.0000 [IU] | Freq: Three times a day (TID) | INTRAMUSCULAR | Status: DC
  Administered 2021-04-07 – 2021-04-08 (×5): 5000 [IU] via SUBCUTANEOUS
  Filled 2021-04-07 (×5): qty 1

## 2021-04-07 MED ORDER — HYDRALAZINE HCL 25 MG PO TABS
25.0000 mg | ORAL_TABLET | Freq: Four times a day (QID) | ORAL | Status: DC | PRN
  Administered 2021-04-07 – 2021-04-12 (×3): 25 mg via ORAL
  Filled 2021-04-07 (×4): qty 1

## 2021-04-07 MED ORDER — FERROUS SULFATE 325 (65 FE) MG PO TABS
650.0000 mg | ORAL_TABLET | Freq: Every day | ORAL | Status: DC
  Administered 2021-04-07 – 2021-04-13 (×3): 650 mg via ORAL
  Filled 2021-04-07 (×6): qty 2

## 2021-04-07 MED ORDER — ACETAMINOPHEN 325 MG PO TABS: 650.0000 mg | ORAL_TABLET | Freq: Four times a day (QID) | ORAL | Status: DC | PRN

## 2021-04-07 MED ORDER — NEPRO/CARBSTEADY PO LIQD
237.0000 mL | Freq: Two times a day (BID) | ORAL | Status: DC
  Administered 2021-04-10 – 2021-04-13 (×5): 237 mL via ORAL

## 2021-04-07 MED ORDER — AMLODIPINE BESYLATE 10 MG PO TABS
10.0000 mg | ORAL_TABLET | Freq: Every day | ORAL | Status: DC
  Filled 2021-04-07: qty 1

## 2021-04-07 MED ORDER — CILOSTAZOL 100 MG PO TABS
100.0000 mg | ORAL_TABLET | Freq: Two times a day (BID) | ORAL | Status: DC
Start: 2021-04-08 — End: 2021-04-09
  Administered 2021-04-08 (×2): 100 mg via ORAL
  Filled 2021-04-07 (×3): qty 1

## 2021-04-07 MED ORDER — INSULIN ASPART 100 UNIT/ML IJ SOLN
0.0000 [IU] | Freq: Three times a day (TID) | INTRAMUSCULAR | Status: DC
  Administered 2021-04-08 – 2021-04-10 (×2): 1 [IU] via SUBCUTANEOUS
  Administered 2021-04-11: 3 [IU] via SUBCUTANEOUS
  Administered 2021-04-12 (×2): 1 [IU] via SUBCUTANEOUS

## 2021-04-07 MED ORDER — LORAZEPAM 2 MG/ML IJ SOLN: 4.0000 mg | INTRAMUSCULAR | Status: DC | PRN

## 2021-04-07 NOTE — Procedures (Addendum)
Patient Name: Julie Brewer  ?MRN: [id number]  ?Epilepsy Attending: Lora Havens  ?Referring Physician/Provider: Amie Portland, MD ?Duration:  04/07/2021 1628 to 04/08/2021  1628 ? ?Patient history:  71 y.o. female with past medical history of ESRD (T/TH/S), CHF, DM, HTN, HLD, seizures, prior stroke, PVD, and blindness who presents to the [facility] Ed for evaluation of seizure this am. Home Lamictal dose was increased to '200mg'$  BID this past Friday due to increasing frequency of seizures despite compliance to medications.  Seizures usually happen around the time dialysis. EEG to evaluate for seizure ? ?Level of alertness: Awake, asleep ? ?AEDs during EEG study: LTG ? ?Technical aspects: This EEG study was done with scalp electrodes positioned according to the 10-20 International system of electrode placement. Electrical activity was acquired at a sampling rate of '500Hz'$  and reviewed with a high frequency filter of '70Hz'$  and a low frequency filter of '1Hz'$ . EEG data were recorded continuously and digitally stored.  ? ?Description: The posterior dominant rhythm consists of 8 Hz activity of moderate voltage (25-35 uV) seen predominantly in posterior head regions, symmetric and reactive to eye opening and eye closing. Sleep was characterized by vertex waves, sleep spindles (12 to 14 Hz), maximal frontocentral region. Hyperventilation and photic stimulation were not performed.    ? ?IMPRESSION: ?This study is within normal limits. No seizures or epileptiform discharges were seen throughout the recording. ? ?Lora Havens  ? ?

## 2021-04-07 NOTE — Progress Notes (Signed)
Patient moved to inpatient room on 5W, tech transported [employer] and insured successful hook up. ?LTM maint complete - no skin breakdown under: Fp1 Fp2 F7 ?[facility] monitored, Event button test confirmed by [facility]. ? ?

## 2021-04-07 NOTE — ED Triage Notes (Signed)
Pt BIB [facility] for increased seizures for the last few weeks, 2-3 times per week. Pt takes lamotrigine for seizures, denies missing doses, states pt had an increase in medication dose last Friday. Pt took last dose this AM. Pt had a seizure this AM, witnessed by family, seemingly grand mal, lasting 1-2 min. Pt also endorses pain to her R side of her head that radiates to her R neck. Pt was not postictal with EMS and did not have seizure activity with EMS. CBG 189. Pt is a Tuesday Thursday Saturday HD pt. No missed HD treatments.  ?

## 2021-04-07 NOTE — Telephone Encounter (Signed)
I spoke with patient's daughter advised 911. Patient complaint of right head pain and nausea. ?

## 2021-04-07 NOTE — ED Provider Notes (Signed)
[place] EMERGENCY DEPARTMENT Provider Note   CSN: [id number] Arrival date & time: 04/07/21  1019     History  Chief Complaint  Patient presents with   Seizures    Julie Brewer is a 71 y.o. female.  HPI Patient presents via EMS with increased seizure activity.  Currently she is awake, alert, providing her own history, states that she feels weak all over, but denies pain.  She has been taking her medication regularly, according to her, including increased dose of Lamictal.  No reported fall, trauma.  EMS reports patient was hemodynamically awake and alert throughout transport.  Patient is legally blind.    Home Medications Prior to Admission medications   Medication Sig Start Date End Date Taking? Authorizing Provider  acetaminophen (TYLENOL) 325 MG tablet Take 2 tablets (650 mg total) by mouth every 6 (six) hours as needed for mild pain (or Fever >/= 101). 02/20/19   Domenic Polite, MD  amLODipine (NORVASC) 10 MG tablet SMARTSIG:.5 Tablet(s) By Mouth Daily 08/28/19   [provider]  aspirin 325 MG EC tablet Take 325 mg by mouth daily.     [provider]  atorvastatin (LIPITOR) 20 MG tablet Take 1 tablet by mouth once daily 03/20/21   Elayne Snare, MD  bisacodyl (DULCOLAX) 5 MG EC tablet Take 5 mg by mouth daily as needed for moderate constipation.    [provider]  Blood Glucose Monitoring Suppl (FREESTYLE FREEDOM) KIT Use to check blood sugar once a day dx code 02/05/20   Elayne Snare, MD  cilostazol (PLETAL) 100 MG tablet Take 1 tablet by mouth twice daily 02/01/21   Waynetta Sandy, MD  docusate sodium (COLACE) 100 MG capsule Take 200 mg by mouth daily.     [provider]  Doxercalciferol (HECTOROL IV) Doxercalciferol (Hectorol) 11/22/20 11/21/21  [provider]  ferrous sulfate 325 (65 FE) MG tablet Take 650 mg by mouth daily.    [provider]  gabapentin (NEURONTIN) 100 MG capsule TAKE  1 CAPSULE BY MOUTH TWICE DAILY AS NEEDED 01/11/21   Elayne Snare, MD  glucose blood (FREESTYLE LITE) test strip USE AS INSTRUCTED TO CHECK BLOOD SUGAR ONCE DAILY. 05/12/19   Elayne Snare, MD  heparin 1000 unit/mL SOLN injection Heparin Sodium (Porcine) 1,000 Units/mL Systemic 11/15/20 11/14/21  [provider]  insulin glargine (LANTUS) 100 UNIT/ML injection INJECT 16 UNITS SUBCUTANEOUSLY ONCE DAILY IN THE MORNING Patient taking differently: INJECT 12-14 UNITS SUBCUTANEOUSLY ONCE DAILY IN THE MORNING 02/09/21   Renato Shin, MD  Insulin Pen Needle 31G X 5 MM MISC Use with pen 02/17/20   Elayne Snare, MD  insulin regular (NOVOLIN R) 100 units/mL injection Inject into the skin 2 (two) times daily before a meal. Inject 10 units under the skin before breakfast and 5-7 units before dinner.    [provider]  iron sucrose in sodium chloride 0.9 % 100 mL Inject into the vein. 11/26/20 11/25/21  [provider]  lamoTRIgine (LAMICTAL) 200 MG tablet Take 1 tablet twice a day 03/31/21   Cameron Sprang, MD  lidocaine-prilocaine (EMLA) cream SMARTSIG:Sparingly Topical 08/10/19   [provider]  Methoxy PEG-Epoetin Beta (MIRCERA IJ) Mircera 06/30/20 06/29/21  [provider]  omeprazole (PRILOSEC) 20 MG capsule Take 1 capsule by mouth once daily 03/31/21   Elayne Snare, MD  sevelamer carbonate (RENVELA) 800 MG tablet Take by mouth. Patient not taking: Reported on 03/31/2021 03/24/20   [provider]  triamcinolone ointment (  KENALOG) 0.1 % SMARTSIG:Liberally Topical Twice Daily 09/28/20   [provider]  Vitamin D, Ergocalciferol, (DRISDOL) 1.25 MG (50000 UNIT) CAPS capsule Take 1 capsule by mouth once a week 03/16/19   Elayne Snare, MD      Allergies    Morphine and related and Penicillins    Review of Systems   Review of Systems  Constitutional:        Per HPI, otherwise negative  HENT:         Per HPI, otherwise negative  Eyes:        Blind   Respiratory:         Per HPI, otherwise negative  Cardiovascular:        Per HPI, otherwise negative  Gastrointestinal:  Negative for vomiting.  Endocrine:       Negative aside from HPI  Genitourinary:        Neg aside from HPI   Musculoskeletal:        Per HPI, otherwise negative  Skin: Negative.   Neurological:  Positive for seizures. Negative for syncope.   Physical Exam Updated Vital Signs BP (!) 162/68    Pulse 85    Temp 98.1 F (36.7 C) (Oral)    Resp (!) 22    Ht 5' 3"  (1.6 m)    Wt 59 kg    SpO2 96%    BMI 23.03 kg/m  Physical Exam Vitals and nursing note reviewed.  Constitutional:      General: She is not in acute distress.    Appearance: She is well-developed.  HENT:     Head: Normocephalic and atraumatic.  Eyes:     Conjunctiva/sclera: Conjunctivae normal.  Cardiovascular:     Rate and Rhythm: Normal rate and regular rhythm.  Pulmonary:     Effort: Pulmonary effort is normal. No respiratory distress.     Breath sounds: Normal breath sounds. No stridor.  Abdominal:     General: There is no distension.  Skin:    General: Skin is warm and dry.  Neurological:     Mental Status: She is alert and oriented to person, place, and time.     Cranial Nerves: Cranial nerve deficit present.     Comments: Blind, otherwise cranial nerves unremarkable.  Strength in all 4 extremities appropriate, no tremor, patient is awake, alert, speaking clearly    ED Results / Procedures / Treatments   Labs (all labs ordered are listed, but only abnormal results are displayed) Labs Reviewed  COMPREHENSIVE METABOLIC PANEL - Abnormal; Notable for the following components:      Result Value   Potassium 3.2 (*)    Chloride 94 (*)    Glucose, Bld 200 (*)    Creatinine, Ser 4.19 (*)    Total Protein 6.2 (*)    Albumin 3.4 (*)    AST 14 (*)    GFR, Estimated 11 (*)    All other components within normal limits  LACTIC ACID, PLASMA - Abnormal; Notable for the following components:    Lactic Acid, Venous 2.7 (*)    All other components within normal limits  LACTIC ACID, PLASMA - Abnormal; Notable for the following components:   Lactic Acid, Venous 2.0 (*)    All other components within normal limits  CBC WITH DIFFERENTIAL/PLATELET - Abnormal; Notable for the following components:   RBC 3.79 (*)    Hemoglobin 10.3 (*)    HCT 32.3 (*)    RDW 17.3 (*)    All other  components within normal limits  RESP PANEL BY RT-PCR (FLU A&B, COVID) [id number]  ETHANOL  LAMOTRIGINE LEVEL    EKG None  Radiology CT Head Wo Contrast  Result Date: 04/07/2021 CLINICAL DATA:  Altered mental status. EXAM: CT HEAD WITHOUT CONTRAST TECHNIQUE: Contiguous axial images were obtained from the base of the skull through the vertex without intravenous contrast. RADIATION DOSE REDUCTION: This exam was performed according to the departmental dose-optimization program which includes automated exposure control, adjustment of the mA and/or kV according to patient size and/or use of iterative reconstruction technique. COMPARISON:  MRI brain dated March 16, 2020. CT head dated February 06, 2019. FINDINGS: Brain: No evidence of acute infarction, hemorrhage, hydrocephalus, extra-axial collection or mass lesion/mass effect. Stable mild atrophy and moderate chronic microvascular ischemic changes. Vascular: Atherosclerotic vascular calcification of the carotid siphons. No hyperdense vessel. Skull: Normal. Negative for fracture or focal lesion. Sinuses/Orbits: No acute finding. Other: None. IMPRESSION: 1. No acute intracranial abnormality. 2. Stable mild atrophy and moderate chronic microvascular ischemic changes. Electronically Signed   By: Titus Dubin M.D.   On: 04/07/2021 11:43    Procedures Procedures    Medications Ordered in ED Medications  sodium chloride 0.9 % bolus 500 mL (0 mLs Intravenous Stopped 04/07/21 1149)    ED Course/ Medical Decision Making/ A&P  This patient presents to the ED for concern  of seizure, this involves an extensive number of treatment options, and is a complaint that carries with it a high risk of complications and morbidity.  The differential diagnosis includes new phenomena brain tumor, electrolyte abnormality, infection versus medication tolerance   Co morbidities that complicate the patient evaluation  age, seizures   Social Determinants of Health:  Age, blind   Additional history obtained:  Additional history and/or information obtained from EMS External records from outside source obtained and reviewed including as above details of seizures, hemodynamic status in transport, family history according EMS provider   After the initial evaluation, orders, including: Labs, CT were initiated.  Patient placed on Cardiac and Pulse-Oximetry Monitors. The patient was maintained on a cardiac monitor.  The cardiac monitored showed an rhythm of 90 sinus normal The patient was also maintained on pulse oximetry. The readings were typically 100% room air normal  On repeat evaluation of the patient improved  Lab Tests:  I personally interpreted labs.  The pertinent results include: Lactic acidosis which improved with fluid resuscitation, ongoing demonstration of renal dysfunction, COVID-negative, flu negative  Imaging Studies ordered:  I independently visualized and interpreted imaging which showed head CT without notable intracranial abnormality I agree with the radiologist interpretation  Consultations Obtained:  I requested consultation with the neurology and internal medicine teams,  and discussed lab and imaging findings as well as pertinent plan - they recommend: Admission with consideration of a continuous EEG per neurology given the patient's outpatient labs which demonstrated therapeutic Lamictal levels, though today's studies pending.   Dispostion / Final MDM:  After consideration of the diagnostic results and the patient's response to treatment,  she will be admitted for further monitoring, managed.  Adult female with multiple medical issues, including seizures presents with new frequency of seizures in spite of seemingly therapeutic Lamictal levels, the confirmation is pending on admission.  Patient's physical exam is generally reassuring, consistent with baseline, noting prior blindness, no new deficiencies.  Patient's labs consistent with recent seizure with lactic acidosis which improved.  No evidence for acute new concurrent phenomenon such as intracranial mass,'s, infection, COVID.  Final  Clinical Impression(s) / ED Diagnoses Final diagnoses:  Seizure [facility])     Carmin Muskrat, MD 04/07/21 1416

## 2021-04-07 NOTE — H&P (Signed)
History and Physical    Julie Brewer XEN:[id number] DOB: 1950/02/16 DOA: 04/07/2021  PCP: Glenis Smoker, MD (Confirm with patient/family/NH records and if not entered, this has to be entered at [facility] point of entry) Patient coming from: Home  I have personally briefly reviewed patient's old medical records in [place]  Chief Complaint: I had seizure  HPI: Julie Brewer is a 71 y.o. female with medical history significant of seizure disorder, ESRD on HD TTS, chronic HFpEF, IDDM, HTN, bilateral blindness, came to ED for repeated seizure for 1 week.  Patient was first diagnosed with seizure 1 year ago has been on Lamictal 100 mg twice daily and has remained stable until about 1 week ago, patient started to have episode of breakthrough seizures witnessed by family at home.  Which family describes as patient will make funny sound and then passed out and lose control of urine and bowel movement.  Patient denies any prodromes of palpitations, sweaty or feeling nauseous vomiting.  Neurology was informed and increased her Lamictal from 100 twice daily to 200 mg twice daily.  Patient has been compliant with her Lamictal, and there is no new recent medications on board.  This morning, patient had another seizure episode with loss of consciousness and loss control of urine and bowel movement but no tongue biting.  Episode.  To be less than 2 minutes and patient recovered with post ictal confusion.  Patient denies any headache, no nauseous vomiting or chest pain or abdominal pain.  ED Course: No tachycardia no hypotension, afebrile, no hypoxia.  CT head negative for acute findings.  Review of Systems: As per HPI otherwise 14 point review of systems negative.    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   CHF (congestive heart failure) (HCC)    Chronic  kidney disease    Tu/Th/Sa   Diabetes mellitus    Type II per Dr [name]  (patient said type I)   Fibroid    GERD (gastroesophageal reflux disease)    Glaucoma    Hyperlipidemia    Hypertension    Iron deficiency anemia 03/09/2016   Peripheral vascular disease ([place])    Pneumonia 2006   PONV (postoperative nausea and vomiting)    Shortness of breath dyspnea    with exdrtion, "Walkling too fast"   Stroke [facility])    no residual    Past Surgical History:  Procedure Laterality Date   ABDOMINAL HYSTERECTOMY     AV FISTULA PLACEMENT Left 04/27/2019   Procedure: INSERTION OF ARTERIOVENOUS (AV) GORE-TEX GRAFT LEFT UPPER ARM;  Surgeon: Angelia Mould, MD;  Location: [place];  Service: Vascular;  Laterality: Left;   BIOPSY  06/10/2017   Procedure: BIOPSY;  Surgeon: Ronnette Juniper, MD;  Location: [place];  Service: Gastroenterology;;   BREAST BIOPSY Right    CERVICAL FUSION     with graft from hip   COLONOSCOPY  July 09, 2012   DIRECT LARYNGOSCOPY N/A 06/07/2014   Procedure: DIRECT LARYNGOSCOPY with BIOPSY and EXCISION VOLLECULAR CYST;  Surgeon: Ruby Cola, MD;  Location: [place];  Service: ENT;  Laterality: N/A;   ESOPHAGOGASTRODUODENOSCOPY (EGD) WITH PROPOFOL Left 06/10/2017   Procedure: ESOPHAGOGASTRODUODENOSCOPY (EGD) WITH PROPOFOL;  Surgeon: Ronnette Juniper, MD;  Location: [place];  Service: Gastroenterology;  Laterality: Left;   FLEXIBLE SIGMOIDOSCOPY Left 06/10/2017  Procedure: FLEXIBLE SIGMOIDOSCOPY;  Surgeon: Ronnette Juniper, MD;  Location: [place];  Service: Gastroenterology;  Laterality: Left;   LOOP RECORDER INSERTION N/A 06/20/2016   Procedure: Loop Recorder Insertion;  Surgeon: [name], MD;  Location: [place] CV [facility];  Service: Cardiovascular;  Laterality: N/A;   REFRACTIVE SURGERY Bilateral    both eyes   SPINE SURGERY     lumbar     reports that she has been smoking cigarettes. She has a 30.00 pack-year smoking history. She has never used smokeless tobacco.  She reports that she does not currently use alcohol. She reports that she does not currently use drugs after having used the following drugs: Methylphenidate.  Allergies  Allergen Reactions   Morphine And Related Other (See Comments)    Hallucinations    Penicillins Swelling and Rash    Throat swelling Did it involve swelling of the face/tongue/throat, SOB, or low BP? Yes Did it involve sudden or severe rash/hives, skin peeling, or any reaction on the inside of your mouth or nose? Yes Did you need to seek medical attention at a hospital or doctor's office? Yes When did it last happen?   young child    If all above answers are NO, may proceed with cephalosporin use.    Family History  Problem Relation Age of Onset   Cancer Mother    Heart disease Mother    Diabetes Father    Cancer Brother    Cancer Brother    Throat cancer Brother      Prior to Admission medications   Medication Sig Start Date End Date Taking? Authorizing Provider  acetaminophen (TYLENOL) 325 MG tablet Take 2 tablets (650 mg total) by mouth every 6 (six) hours as needed for mild pain (or Fever >/= 101). 02/20/19  Yes Domenic Polite, MD  amLODipine (NORVASC) 5 MG tablet Take 5 mg by mouth daily. 04/06/21  Yes [provider]  aspirin 325 MG EC tablet Take 325 mg by mouth daily.     [provider]  atorvastatin (LIPITOR) 20 MG tablet Take 1 tablet by mouth once daily 03/20/21   Elayne Snare, MD  bisacodyl (DULCOLAX) 5 MG EC tablet Take 5 mg by mouth daily as needed for moderate constipation.    [provider]  Blood Glucose Monitoring Suppl (FREESTYLE FREEDOM) KIT Use to check blood sugar once a day dx code 02/05/20   Elayne Snare, MD  cilostazol (PLETAL) 100 MG tablet Take 1 tablet by mouth twice daily 02/01/21   Waynetta Sandy, MD  docusate sodium (COLACE) 100 MG capsule Take 200 mg by mouth daily.     [provider]  Doxercalciferol (HECTOROL IV) Doxercalciferol  (Hectorol) 11/22/20 11/21/21  [provider]  ferrous sulfate 325 (65 FE) MG tablet Take 650 mg by mouth daily.    [provider]  gabapentin (NEURONTIN) 100 MG capsule TAKE 1 CAPSULE BY MOUTH TWICE DAILY AS NEEDED 01/11/21   Elayne Snare, MD  glucose blood (FREESTYLE LITE) test strip USE AS INSTRUCTED TO CHECK BLOOD SUGAR ONCE DAILY. 05/12/19   Elayne Snare, MD  heparin 1000 unit/mL SOLN injection Heparin Sodium (Porcine) 1,000 Units/mL Systemic 11/15/20 11/14/21  [provider]  insulin glargine (LANTUS) 100 UNIT/ML injection INJECT 16 UNITS SUBCUTANEOUSLY ONCE DAILY IN THE MORNING Patient taking differently: INJECT 12-14 UNITS SUBCUTANEOUSLY ONCE DAILY IN THE MORNING 02/09/21   Renato Shin, MD  Insulin Pen Needle 31G X 5 MM MISC Use with pen 02/17/20   Elayne Snare,  MD  insulin regular (NOVOLIN R) 100 units/mL injection Inject into the skin 2 (two) times daily before a meal. Inject 10 units under the skin before breakfast and 5-7 units before dinner.    [provider]  iron sucrose in sodium chloride 0.9 % 100 mL Inject into the vein. 11/26/20 11/25/21  [provider]  lamoTRIgine (LAMICTAL) 200 MG tablet Take 1 tablet twice a day 03/31/21   Cameron Sprang, MD  lidocaine-prilocaine (EMLA) cream SMARTSIG:Sparingly Topical 08/10/19   [provider]  Methoxy PEG-Epoetin Beta (MIRCERA IJ) Mircera 06/30/20 06/29/21  [provider]  omeprazole (PRILOSEC) 20 MG capsule Take 1 capsule by mouth once daily 03/31/21   Elayne Snare, MD  sevelamer carbonate (RENVELA) 800 MG tablet Take by mouth. Patient not taking: Reported on 03/31/2021 03/24/20   [provider]  triamcinolone ointment (KENALOG) 0.1 % SMARTSIG:Liberally Topical Twice Daily 09/28/20   [provider]  Vitamin D, Ergocalciferol, (DRISDOL) 1.25 MG (50000 UNIT) CAPS capsule Take 1 capsule by mouth once a week 03/16/19   Elayne Snare, MD    Physical Exam: Vitals:    04/07/21 1400 04/07/21 1415 04/07/21 1430 04/07/21 1445  BP: (!) 164/66 (!) 152/60 (!) 153/65 (!) 156/63  Pulse:  86  87  Resp: 17 17 (!) 21 (!) 26  Temp:      TempSrc:      SpO2:  95%  93%  Weight:      Height:        Constitutional: NAD, calm, comfortable Vitals:   04/07/21 1400 04/07/21 1415 04/07/21 1430 04/07/21 1445  BP: (!) 164/66 (!) 152/60 (!) 153/65 (!) 156/63  Pulse:  86  87  Resp: 17 17 (!) 21 (!) 26  Temp:      TempSrc:      SpO2:  95%  93%  Weight:      Height:       Eyes: PERRL, lids and conjunctivae normal ENMT: Mucous membranes are moist. Posterior pharynx clear of any exudate or lesions.Normal dentition.  Neck: normal, supple, no masses, no thyromegaly Respiratory: clear to auscultation bilaterally, no wheezing, no crackles. Normal respiratory effort. No accessory muscle use.  Cardiovascular: Regular rate and rhythm, no murmurs / rubs / gallops. No extremity edema. 2+ pedal pulses. No carotid bruits.  Abdomen: no tenderness, no masses palpated. No hepatosplenomegaly. Bowel sounds positive.  Musculoskeletal: no clubbing / cyanosis. No joint deformity upper and lower extremities. Good ROM, no contractures. Normal muscle tone.  Skin: no rashes, lesions, ulcers. No induration Neurologic: CN 2-12 grossly intact. Sensation intact, DTR normal. Strength 5/5 in all 4.  Psychiatric: Normal judgment and insight. Alert and oriented x 3. Normal mood.    Labs on Admission: I have personally reviewed following labs and imaging studies  CBC: Recent Labs  Lab 04/07/21 1040  WBC 8.5  NEUTROABS 6.5  HGB 10.3*  HCT 32.3*  MCV 85.2  PLT 962   Basic Metabolic Panel: Recent Labs  Lab 04/07/21 1040  NA 136  K 3.2*  CL 94*  CO2 30  GLUCOSE 200*  BUN 11  CREATININE 4.19*  CALCIUM 9.5   GFR: Estimated Creatinine Clearance: 10.6 mL/min (A) (by C-G formula based on SCr of 4.19 mg/dL (H)). Liver Function Tests: Recent Labs  Lab 04/07/21 1040  AST 14*  ALT 13   ALKPHOS 78  BILITOT 0.4  PROT 6.2*  ALBUMIN 3.4*   No results for input(s): LIPASE, AMYLASE in the last 168 hours. No results  for input(s): AMMONIA in the last 168 hours. Coagulation Profile: No results for input(s): INR, PROTIME in the last 168 hours. Cardiac Enzymes: No results for input(s): CKTOTAL, CKMB, CKMBINDEX, TROPONINI in the last 168 hours. BNP (last 3 results) No results for input(s): PROBNP in the last 8760 hours. HbA1C: No results for input(s): HGBA1C in the last 72 hours. CBG: No results for input(s): GLUCAP in the last 168 hours. Lipid Profile: No results for input(s): CHOL, HDL, LDLCALC, TRIG, CHOLHDL, LDLDIRECT in the last 72 hours. Thyroid Function Tests: No results for input(s): TSH, T4TOTAL, FREET4, T3FREE, THYROIDAB in the last 72 hours. Anemia Panel: No results for input(s): VITAMINB12, FOLATE, FERRITIN, TIBC, IRON, RETICCTPCT in the last 72 hours. Urine analysis:    Component Value Date/Time   COLORURINE YELLOW 02/10/2019 [place] 02/10/2019 1223   LABSPEC 1.011 02/10/2019 1223   LABSPEC 1.010 03/09/2016 1400   PHURINE 5.0 02/10/2019 1223   GLUCOSEU NEGATIVE 02/10/2019 1223   GLUCOSEU NEGATIVE 03/11/2018 0958   GLUCOSEU Negative 03/09/2016 1400   HGBUR SMALL (A) 02/10/2019 1223   BILIRUBINUR NEGATIVE 02/10/2019 1223   BILIRUBINUR Negative 03/09/2016 1400   KETONESUR NEGATIVE 02/10/2019 1223   PROTEINUR 100 (A) 02/10/2019 1223   UROBILINOGEN 0.2 03/11/2018 0958   UROBILINOGEN 0.2 03/09/2016 1400   NITRITE NEGATIVE 02/10/2019 1223   LEUKOCYTESUR NEGATIVE 02/10/2019 1223   LEUKOCYTESUR Negative 03/09/2016 1400    Radiological Exams on Admission: CT Head Wo Contrast  Result Date: 04/07/2021 CLINICAL DATA:  Altered mental status. EXAM: CT HEAD WITHOUT CONTRAST TECHNIQUE: Contiguous axial images were obtained from the base of the skull through the vertex without intravenous contrast. RADIATION DOSE REDUCTION: This exam was  performed according to the departmental dose-optimization program which includes automated exposure control, adjustment of the mA and/or kV according to patient size and/or use of iterative reconstruction technique. COMPARISON:  MRI brain dated March 16, 2020. CT head dated February 06, 2019. FINDINGS: Brain: No evidence of acute infarction, hemorrhage, hydrocephalus, extra-axial collection or mass lesion/mass effect. Stable mild atrophy and moderate chronic microvascular ischemic changes. Vascular: Atherosclerotic vascular calcification of the carotid siphons. No hyperdense vessel. Skull: Normal. Negative for fracture or focal lesion. Sinuses/Orbits: No acute finding. Other: None. IMPRESSION: 1. No acute intracranial abnormality. 2. Stable mild atrophy and moderate chronic microvascular ischemic changes. Electronically Signed   By: Titus Dubin M.D.   On: 04/07/2021 11:43    [id number]  Assessment/Plan Principal Problem:   Seizure ([place])  (please populate well all problems here in Problem List. (For example, if patient is on BP meds at home and you resume or decide to hold them, it is a problem that needs to be her. Same for CAD, COPD, HLD and so on)  Breakthrough seizure -Neurology consultation appreciated, as per neurology, will hold off Lamictal for now.  Seizure precaution, Lamictal level pending.  As needed Ativan for seizure. -Other DDx, hypoglycemia should be considered given the patient on long-acting insulin and her past A1c has been<7.  ESRD on HD -Completed HD yesterday, if patient stay through tomorrow, will need to call nephrology to coordinate Saturday's HD.  HTN -Stable, continue Norvasc.  IDDM -Cut down Lantus from 14 to 7 units daily -Sliding scale renal dosed for now.  Hypokalemia -P.o. replacement with 20 mg given the ESRD status.  HLD -Statin  PVD -Continue cilostazol  DVT prophylaxis: Heparin subcu Code Status: Full code Family Communication: None at  bedside Disposition Plan: Expect less than 2 midnight hospital stay  Consults called: Neurology Admission status: Telemetry observation   Lequita Halt MD [facility] Pager [phone]  04/07/2021, 3:56 PM

## 2021-04-07 NOTE — Telephone Encounter (Signed)
Patient's daughter called to report the patient had another seizure this morning. ? ?She also had one this past Sunday. ? ?She is complaining of right side head pain and nausea. ? ?[facility] in [place] ? ?Call sent to [facility] ?

## 2021-04-07 NOTE — Consult Note (Signed)
Neurology Consultation  Reason for Consult: seizures  Referring Physician: Dr. Vanita Panda  CC: seizure this am   History is obtained from:patient   HPI: Julie Brewer is a 71 y.o. female with past medical history of ESRD (T/TH/S), CHF, DM, HTN, HLD, seizures, prior stroke, PVD, and blindness who presents to the [facility] Ed for evaluation of seizure this am.  Patient is awake, alert and oriented x 4, lying in bed in NAD. Patient states she has been having increased frequency in seizures and that this past Friday her Lamictal dose was increased to 281m BID. She states she has not missed any doses no has she missed any HD sessions. She states that this am she had a seizure, does not remember event, witnessed by daughter when seizure happened and called EMS. She denies any recent illness, CP, cough, N/V. She states her head/neck pain on the right is much improved, this is typical pain after she has a seizure. No family at bedside and unable to clarify seizure activity, however per chart review grand-mal lasting 1-2 minutes.   [facility] who had planned to check Lamictal level, which is therapeutic as well as plans to admit for inpatient video EEG off of medications to evaluate/characterize seizures because of her ongoing seizure activity in spite of being compliant to medications.    ROS: Full ROS was performed and is negative except as noted in the HPI.   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   CHF (congestive heart failure) (HCC)    Chronic kidney disease    Tu/Th/Sa   Diabetes mellitus    Type II per Dr KDwyane Dee (patient said type I)   Fibroid    GERD (gastroesophageal reflux disease)    Glaucoma    Hyperlipidemia    Hypertension    Iron deficiency anemia 03/09/2016   Peripheral vascular disease ([place]    Pneumonia 2006    PONV (postoperative nausea and vomiting)    Shortness of breath dyspnea    with exdrtion, "Walkling too fast"   Stroke ([place]    no residual    Family History  Problem Relation Age of Onset   Cancer Mother    Heart disease Mother    Diabetes Father    Cancer Brother    Cancer Brother    Throat cancer Brother      Social History:   reports that she has been smoking cigarettes. She has a 30.00 pack-year smoking history. She has never used smokeless tobacco. She reports that she does not currently use alcohol. She reports that she does not currently use drugs after having used the following drugs: Methylphenidate.  Medications No current facility-administered medications for this encounter.  Current Outpatient Medications:    acetaminophen (TYLENOL) 325 MG tablet, Take 2 tablets (650 mg total) by mouth every 6 (six) hours as needed for mild pain (or Fever >/= 101)., Disp:  , Rfl:    amLODipine (NORVASC) 10 MG tablet, SMARTSIG:.5 Tablet(s) By Mouth Daily, Disp: , Rfl:    aspirin 325 MG EC tablet, Take 325 mg by mouth daily. , Disp: , Rfl:    atorvastatin (LIPITOR) 20 MG tablet, Take 1 tablet by mouth once daily, Disp: 90 tablet, Rfl: 0   bisacodyl (DULCOLAX) 5 MG EC tablet, Take 5 mg by mouth  daily as needed for moderate constipation., Disp: , Rfl:    Blood Glucose Monitoring Suppl (FREESTYLE FREEDOM) KIT, Use to check blood sugar once a day dx code, Disp: 1 kit, Rfl: 1   cilostazol (PLETAL) 100 MG tablet, Take 1 tablet by mouth twice daily, Disp: 180 tablet, Rfl: 0   docusate sodium (COLACE) 100 MG capsule, Take 200 mg by mouth daily. , Disp: , Rfl:    Doxercalciferol (HECTOROL IV), Doxercalciferol (Hectorol), Disp: , Rfl:    ferrous sulfate 325 (65 FE) MG tablet, Take 650 mg by mouth daily., Disp: , Rfl:    gabapentin (NEURONTIN) 100 MG capsule, TAKE 1 CAPSULE BY MOUTH TWICE DAILY AS NEEDED, Disp: 180 capsule, Rfl: 0   glucose blood (FREESTYLE LITE) test strip, USE AS INSTRUCTED TO  CHECK BLOOD SUGAR ONCE DAILY., Disp: 50 each, Rfl: 3   heparin 1000 unit/mL SOLN injection, Heparin Sodium (Porcine) 1,000 Units/mL Systemic, Disp: , Rfl:    insulin glargine (LANTUS) 100 UNIT/ML injection, INJECT 16 UNITS SUBCUTANEOUSLY ONCE DAILY IN THE MORNING (Patient taking differently: INJECT 12-14 UNITS SUBCUTANEOUSLY ONCE DAILY IN THE MORNING), Disp: 10 mL, Rfl: 0   Insulin Pen Needle 31G X 5 MM MISC, Use with pen, Disp: 100 each, Rfl: 1   insulin regular (NOVOLIN R) 100 units/mL injection, Inject into the skin 2 (two) times daily before a meal. Inject 10 units under the skin before breakfast and 5-7 units before dinner., Disp: , Rfl:    iron sucrose in sodium chloride 0.9 % 100 mL, Inject into the vein., Disp: , Rfl:    lamoTRIgine (LAMICTAL) 200 MG tablet, Take 1 tablet twice a day, Disp: 180 tablet, Rfl: 3   lidocaine-prilocaine (EMLA) cream, SMARTSIG:Sparingly Topical, Disp: , Rfl:    Methoxy PEG-Epoetin Beta (MIRCERA IJ), Mircera, Disp: , Rfl:    omeprazole (PRILOSEC) 20 MG capsule, Take 1 capsule by mouth once daily, Disp: 90 capsule, Rfl: 0   sevelamer carbonate (RENVELA) 800 MG tablet, Take by mouth. (Patient not taking: Reported on 03/31/2021), Disp: , Rfl:    triamcinolone ointment (KENALOG) 0.1 %, SMARTSIG:Liberally Topical Twice Daily, Disp: , Rfl:    Vitamin D, Ergocalciferol, (DRISDOL) 1.25 MG (50000 UNIT) CAPS capsule, Take 1 capsule by mouth once a week, Disp: 12 capsule, Rfl: 0   Exam: Current vital signs: BP (!) 162/68    Pulse 85    Temp 98.1 F (36.7 C) (Oral)    Resp (!) 22    Ht _0  (1.6 m)    Wt 59 kg    SpO2 96%    BMI 23.03 kg/m  Vital signs in last 24 hours: Temp:  [98.1 F (36.7 C)] 98.1 F (36.7 C) (03/10 1031) Pulse Rate:  [85-89] 85 (03/10 1345) Resp:  [13-24] 22 (03/10 1345) BP: (141-167)/(64-76) 162/68 (03/10 1345) SpO2:  [92 %-100 %] 96 % (03/10 1345) Weight:  [59 kg] 59 kg (03/10 1031)  GENERAL: Awake, alert in NAD HEENT: - Normocephalic and  atraumatic, dry mm LUNGS - Clear to auscultation bilaterally with no wheezes CV - S1S2 RRR, no m/r/g, equal pulses bilaterally. ABDOMEN - Soft, nontender, nondistended with normoactive BS Ext: warm, well perfused, intact peripheral pulses, no edema  NEURO:  Mental Status: AA&Ox3  Language: speech is clear  Naming, repetition, fluency, and comprehension intact. Cranial Nerves: legally Blind. no facial asymmetry, facial sensation intact, hearing intact, tongue/uvula/soft palate midline, normal sternocleidomastoid and trapezius muscle strength. No evidence of tongue atrophy or fibrillations Motor: Right lower 4/5, has  pain in hip due to arthritis. Right upper, Left upper and left lower 5/5 Tone: is normal and bulk is normal Sensation- Intact to light touch bilaterally Coordination: FTN unable to assess, no ataxia in BLE. Gait- deferred  Labs I have reviewed labs in epic and the results pertinent to this consultation are:  Lactic acid 2 (2.7) K 3.2   Imaging No imaging to review  Assessment:  Julie Brewer is a 71 y.o. female with past medical history of ESRD (T/TH/S), CHF, DM, HTN, HLD, seizures, prior stroke, PVD, and blindness who presents to the [facility] Ed for evaluation of seizure this am. Home Lamictal dose was increased to 230m BID this past Friday due to increasing frequency of seizures despite compliance to medications.  Seizures usually happen around the time dialysis. She states she has not missed any doses no has she missed any HD sessions. Presents after having a seizure this am  Given that the outpatient provider had already planned for video EEG if the seizure frequency did not improve, we would recommend admission and continuous EEG/long-term monitoring for better seizure characterization-while she is inpatient, we will hold her home Lamictal and monitor on EEG.  Lamictal will be resumed hopefully in the next couple of days  Recommendations: - Admit to  Medicine -Start LTM  -Check lamotrigine level-already ordered by EDP - Hold Lamictal for 24-48 hours. -Continue to maintain seizure precautions - Neurology will follow.  Plan discussed with Dr. [place]lo Shanks DNP, [place]  Attending Neurohospitalist Addendum Patient seen and examined with APP/Resident. Agree with the history and physical as documented above. Agree with the plan as documented, which I helped formulate. I have independently reviewed the chart, obtained history, review of systems and examined the patient.I have personally reviewed pertinent head/neck/spine imaging (CT/MRI). Please feel free to call with any questions.  -- AAmie Portland MD Neurologist [facility] Pager: [phone]

## 2021-04-07 NOTE — Progress Notes (Signed)
LTM hook up.  ?

## 2021-04-08 ENCOUNTER — Encounter (HOSPITAL_COMMUNITY): Payer: Self-pay | Admitting: Internal Medicine

## 2021-04-08 DIAGNOSIS — E876 Hypokalemia: Secondary | ICD-10-CM | POA: Diagnosis present

## 2021-04-08 DIAGNOSIS — K31811 Angiodysplasia of stomach and duodenum with bleeding: Secondary | ICD-10-CM | POA: Diagnosis present

## 2021-04-08 DIAGNOSIS — D631 Anemia in chronic kidney disease: Secondary | ICD-10-CM | POA: Diagnosis present

## 2021-04-08 DIAGNOSIS — K2971 Gastritis, unspecified, with bleeding: Secondary | ICD-10-CM | POA: Diagnosis not present

## 2021-04-08 DIAGNOSIS — N250 Renal osteodystrophy: Secondary | ICD-10-CM | POA: Diagnosis not present

## 2021-04-08 DIAGNOSIS — N186 End stage renal disease: Secondary | ICD-10-CM | POA: Diagnosis present

## 2021-04-08 DIAGNOSIS — Z992 Dependence on renal dialysis: Secondary | ICD-10-CM | POA: Diagnosis not present

## 2021-04-08 DIAGNOSIS — E1151 Type 2 diabetes mellitus with diabetic peripheral angiopathy without gangrene: Secondary | ICD-10-CM | POA: Diagnosis present

## 2021-04-08 DIAGNOSIS — E1129 Type 2 diabetes mellitus with other diabetic kidney complication: Secondary | ICD-10-CM | POA: Diagnosis not present

## 2021-04-08 DIAGNOSIS — F17210 Nicotine dependence, cigarettes, uncomplicated: Secondary | ICD-10-CM | POA: Diagnosis present

## 2021-04-08 DIAGNOSIS — I10 Essential (primary) hypertension: Secondary | ICD-10-CM | POA: Diagnosis not present

## 2021-04-08 DIAGNOSIS — Z8673 Personal history of transient ischemic attack (TIA), and cerebral infarction without residual deficits: Secondary | ICD-10-CM | POA: Diagnosis not present

## 2021-04-08 DIAGNOSIS — K3189 Other diseases of stomach and duodenum: Secondary | ICD-10-CM | POA: Diagnosis not present

## 2021-04-08 DIAGNOSIS — K2091 Esophagitis, unspecified with bleeding: Secondary | ICD-10-CM | POA: Diagnosis not present

## 2021-04-08 DIAGNOSIS — E1165 Type 2 diabetes mellitus with hyperglycemia: Secondary | ICD-10-CM | POA: Diagnosis not present

## 2021-04-08 DIAGNOSIS — Z66 Do not resuscitate: Secondary | ICD-10-CM | POA: Diagnosis present

## 2021-04-08 DIAGNOSIS — K2970 Gastritis, unspecified, without bleeding: Secondary | ICD-10-CM | POA: Diagnosis present

## 2021-04-08 DIAGNOSIS — I5032 Chronic diastolic (congestive) heart failure: Secondary | ICD-10-CM | POA: Diagnosis present

## 2021-04-08 DIAGNOSIS — I120 Hypertensive chronic kidney disease with stage 5 chronic kidney disease or end stage renal disease: Secondary | ICD-10-CM | POA: Diagnosis not present

## 2021-04-08 DIAGNOSIS — E1122 Type 2 diabetes mellitus with diabetic chronic kidney disease: Secondary | ICD-10-CM | POA: Diagnosis present

## 2021-04-08 DIAGNOSIS — G40409 Other generalized epilepsy and epileptic syndromes, not intractable, without status epilepticus: Secondary | ICD-10-CM | POA: Diagnosis present

## 2021-04-08 DIAGNOSIS — I509 Heart failure, unspecified: Secondary | ICD-10-CM | POA: Diagnosis not present

## 2021-04-08 DIAGNOSIS — Z20822 Contact with and (suspected) exposure to covid-19: Secondary | ICD-10-CM | POA: Diagnosis present

## 2021-04-08 DIAGNOSIS — K264 Chronic or unspecified duodenal ulcer with hemorrhage: Secondary | ICD-10-CM | POA: Diagnosis present

## 2021-04-08 DIAGNOSIS — H548 Legal blindness, as defined in USA: Secondary | ICD-10-CM | POA: Diagnosis present

## 2021-04-08 DIAGNOSIS — I739 Peripheral vascular disease, unspecified: Secondary | ICD-10-CM | POA: Diagnosis not present

## 2021-04-08 DIAGNOSIS — K922 Gastrointestinal hemorrhage, unspecified: Secondary | ICD-10-CM | POA: Diagnosis not present

## 2021-04-08 DIAGNOSIS — K209 Esophagitis, unspecified without bleeding: Secondary | ICD-10-CM | POA: Diagnosis not present

## 2021-04-08 DIAGNOSIS — K2100 Gastro-esophageal reflux disease with esophagitis, without bleeding: Secondary | ICD-10-CM | POA: Diagnosis present

## 2021-04-08 DIAGNOSIS — I132 Hypertensive heart and chronic kidney disease with heart failure and with stage 5 chronic kidney disease, or end stage renal disease: Secondary | ICD-10-CM | POA: Diagnosis present

## 2021-04-08 DIAGNOSIS — Z794 Long term (current) use of insulin: Secondary | ICD-10-CM | POA: Diagnosis not present

## 2021-04-08 DIAGNOSIS — E889 Metabolic disorder, unspecified: Secondary | ICD-10-CM | POA: Diagnosis present

## 2021-04-08 DIAGNOSIS — R32 Unspecified urinary incontinence: Secondary | ICD-10-CM | POA: Diagnosis present

## 2021-04-08 DIAGNOSIS — D62 Acute posthemorrhagic anemia: Secondary | ICD-10-CM | POA: Diagnosis present

## 2021-04-08 DIAGNOSIS — K921 Melena: Secondary | ICD-10-CM | POA: Diagnosis not present

## 2021-04-08 DIAGNOSIS — R569 Unspecified convulsions: Secondary | ICD-10-CM | POA: Diagnosis present

## 2021-04-08 DIAGNOSIS — R159 Full incontinence of feces: Secondary | ICD-10-CM | POA: Diagnosis present

## 2021-04-08 DIAGNOSIS — E785 Hyperlipidemia, unspecified: Secondary | ICD-10-CM | POA: Diagnosis present

## 2021-04-08 DIAGNOSIS — I959 Hypotension, unspecified: Secondary | ICD-10-CM | POA: Diagnosis not present

## 2021-04-08 LAB — CBC
HCT: 26.9 % — ABNORMAL LOW (ref 36.0–46.0)
Hemoglobin: 8.7 g/dL — ABNORMAL LOW (ref 12.0–15.0)
MCH: 27.1 pg (ref 26.0–34.0)
MCHC: 32.3 g/dL (ref 30.0–36.0)
MCV: 83.8 fL (ref 80.0–100.0)
Platelets: 213 10*3/uL (ref 150–400)
RBC: 3.21 MIL/uL — ABNORMAL LOW (ref 3.87–5.11)
RDW: 17.1 % — ABNORMAL HIGH (ref 11.5–15.5)
WBC: 7.2 10*3/uL (ref 4.0–10.5)
nRBC: 0 % (ref 0.0–0.2)

## 2021-04-08 LAB — RENAL FUNCTION PANEL
Albumin: 2.8 g/dL — ABNORMAL LOW (ref 3.5–5.0)
Anion gap: 10 (ref 5–15)
BUN: 17 mg/dL (ref 8–23)
CO2: 24 mmol/L (ref 22–32)
Calcium: 8.9 mg/dL (ref 8.9–10.3)
Chloride: 103 mmol/L (ref 98–111)
Creatinine, Ser: 4.68 mg/dL — ABNORMAL HIGH (ref 0.44–1.00)
GFR, Estimated: 10 mL/min — ABNORMAL LOW (ref >60)
Glucose, Bld: 91 mg/dL (ref 70–99)
Phosphorus: 4.9 mg/dL — ABNORMAL HIGH (ref 2.5–4.6)
Potassium: 3.8 mmol/L (ref 3.5–5.1)
Sodium: 137 mmol/L (ref 135–145)

## 2021-04-08 LAB — GLUCOSE, CAPILLARY
Glucose-Capillary: 97 mg/dL (ref 70–99)
Glucose-Capillary: 131 mg/dL — ABNORMAL HIGH (ref 70–99)
Glucose-Capillary: 168 mg/dL — ABNORMAL HIGH (ref 70–99)
Glucose-Capillary: 146 mg/dL — ABNORMAL HIGH (ref 70–99)

## 2021-04-08 LAB — HEMOGLOBIN A1C
Hgb A1c MFr Bld: 6.1 % — ABNORMAL HIGH (ref 4.8–5.6)
Mean Plasma Glucose: 128 mg/dL

## 2021-04-08 LAB — HEPATITIS B SURFACE ANTIGEN: Hepatitis B Surface Ag: NONREACTIVE

## 2021-04-08 LAB — HEPATITIS B SURFACE ANTIBODY,QUALITATIVE: Hep B S Ab: REACTIVE — AB

## 2021-04-08 MED ORDER — DOXERCALCIFEROL 4 MCG/2ML IV SOLN
10.0000 ug | INTRAVENOUS | Status: DC
  Administered 2021-04-11 – 2021-04-13 (×2): 10 ug via INTRAVENOUS
  Filled 2021-04-08 (×5): qty 6

## 2021-04-08 MED ORDER — HEPARIN SODIUM (PORCINE) 1000 UNIT/ML DIALYSIS
2000.0000 [IU] | INTRAMUSCULAR | Status: DC | PRN
  Filled 2021-04-08 (×2): qty 2

## 2021-04-08 MED ORDER — CHLORHEXIDINE GLUCONATE CLOTH 2 % EX PADS
6.0000 | MEDICATED_PAD | Freq: Every day | CUTANEOUS | Status: DC
  Administered 2021-04-12 – 2021-04-13 (×2): 6 via TOPICAL

## 2021-04-08 NOTE — Progress Notes (Addendum)
Neurology Progress Note ? ? ?S:// ?Seen and examined. No seizures overnight. Dialysis planned today. ? ?O:// ?Current vital signs: ?BP (!) 173/69 (BP Location: Right Arm)   Pulse 96   Temp 97.9 ?F (36.6 ?C) (Oral)   Resp 18   Ht 5' 3"  (1.6 m)   Wt 60.9 kg   SpO2 96%   BMI 23.77 kg/m?  ?Vital signs in last 24 hours: ?Temp:  [97.9 ?F (36.6 ?C)-98.7 ?F (37.1 ?C)] 97.9 ?F (36.6 ?C) (03/11 0827) ?Pulse Rate:  [84-96] 96 (03/11 0827) ?Resp:  [13-26] 18 (03/11 0827) ?BP: (121-185)/(54-82) 173/69 (03/11 0827) ?SpO2:  [92 %-100 %] 96 % (03/11 0827) ?Weight:  [59 kg-60.9 kg] 60.9 kg (03/10 2117) ? ?GENERAL: Awake, alert in NAD ?HEENT: - Normocephalic and atraumatic, dry mm ?LUNGS - Clear to auscultation bilaterally with no wheezes ?CV - S1S2 RRR, no m/r/g, equal pulses bilaterally. ?ABDOMEN - Soft, nontender, nondistended with normoactive BS ?Ext: warm, well perfused, intact peripheral pulses, no edema ?  ?NEURO:  ?Mental Status: AA&Ox3  ?Language: speech is clear  Naming, repetition, fluency, and comprehension intact. ?Cranial Nerves: legally Blind. no facial asymmetry, facial sensation intact, hearing intact, tongue/uvula/soft palate midline, normal sternocleidomastoid and trapezius muscle strength. No evidence of tongue atrophy or fibrillations ?Motor: Right lower 4/5, has pain in hip due to arthritis. Right upper, Left upper and left lower 5/5 ?Tone: is normal and bulk is normal ?Sensation- Intact to light touch bilaterally ?Coordination: FTN unable to assess, no ataxia in BLE. ?Gait- deferred ?Clinical exam unchanged from yesterday ? ?Medications ? ?Current Facility-Administered Medications:  ?  acetaminophen (TYLENOL) tablet 650 mg, 650 mg, Oral, Q6H PRN, Wynetta Fines T, MD ?  amLODipine (NORVASC) tablet 10 mg, 10 mg, Oral, Daily, Wynetta Fines T, MD ?  aspirin EC tablet 325 mg, 325 mg, Oral, Daily, Wynetta Fines T, MD, 325 mg at 04/08/21 3976 ?  atorvastatin (LIPITOR) tablet 20 mg, 20 mg, Oral, Daily, Wynetta Fines T,  MD ?  bisacodyl (DULCOLAX) EC tablet 5 mg, 5 mg, Oral, Daily PRN, Wynetta Fines T, MD ?  chlorhexidine gluconate (MEDLINE KIT) (PERIDEX) 0.12 % solution 15 mL, 15 mL, Mouth Rinse, BID, Wynetta Fines T, MD, 15 mL at 04/08/21 0814 ?  Chlorhexidine Gluconate Cloth 2 % PADS 6 each, 6 each, Topical, Daily, Lequita Halt, MD, 6 each at 04/08/21 [id number] ?  cilostazol (PLETAL) tablet 100 mg, 100 mg, Oral, BID, Wynetta Fines T, MD, 100 mg at 04/08/21 9379 ?  docusate sodium (COLACE) capsule 200 mg, 200 mg, Oral, Daily, Wynetta Fines T, MD, 200 mg at 04/07/21 1637 ?  feeding supplement (NEPRO CARB STEADY) liquid 237 mL, 237 mL, Oral, BID BM, Wynetta Fines T, MD ?  ferrous sulfate tablet 650 mg, 650 mg, Oral, Daily, Wynetta Fines T, MD, 650 mg at 04/08/21 0810 ?  heparin injection 5,000 Units, 5,000 Units, Subcutaneous, Q8H, Lequita Halt, MD, 5,000 Units at 04/08/21 0522 ?  hydrALAZINE (APRESOLINE) tablet 25 mg, 25 mg, Oral, Q6H PRN, Lequita Halt, MD, 25 mg at 04/07/21 1904 ?  insulin aspart (novoLOG) injection 0-5 Units, 0-5 Units, Subcutaneous, QHS, Zhang, Ping T, MD ?  insulin aspart (novoLOG) injection 0-6 Units, 0-6 Units, Subcutaneous, TID WC, Lequita Halt, MD ?  insulin glargine-yfgn [facility]) injection 7 Units, 7 Units, Subcutaneous, Daily, Lequita Halt, MD, 7 Units at 04/08/21 [id number] ?  LORazepam (ATIVAN) injection 4 mg, 4 mg, Intravenous, Q5 Min x 2 PRN, Lequita Halt, MD ?  MEDLINE mouth rinse, 15 mL, Mouth Rinse, Q4H, Zhang, Ping T, MD ?  pantoprazole (PROTONIX) EC tablet 40 mg, 40 mg, Oral, Daily, Wynetta Fines T, MD, 40 mg at 04/08/21 0810 ?Labs ?CBC ?   ?Component Value Date/Time  ? WBC 7.2 04/08/2021 0718  ? RBC 3.21 (L) 04/08/2021 1735  ? HGB 8.7 (L) 04/08/2021 0718  ? HGB 9.3 (L) 07/17/2017 1012  ? HGB 10.1 (L) 01/17/2017 1043  ? HCT 26.9 (L) 04/08/2021 6701  ? HCT 31.8 (L) 01/17/2017 1043  ? PLT 213 04/08/2021 0718  ? PLT 239 07/17/2017 1012  ? PLT 227 01/17/2017 1043  ? MCV 83.8 04/08/2021 0718  ? MCV 82.0 01/17/2017 1043   ? MCH 27.1 04/08/2021 0718  ? MCHC 32.3 04/08/2021 0718  ? RDW 17.1 (H) 04/08/2021 4103  ? RDW 16.4 (H) 01/17/2017 1043  ? LYMPHSABS 1.0 04/07/2021 1040  ? LYMPHSABS 1.6 01/17/2017 1043  ? MONOABS 0.7 04/07/2021 1040  ? MONOABS 0.6 01/17/2017 1043  ? EOSABS 0.2 04/07/2021 1040  ? EOSABS 0.1 01/17/2017 1043  ? BASOSABS 0.1 04/07/2021 1040  ? BASOSABS 0.0 01/17/2017 1043  ? ? ?CMP  ?   ?Component Value Date/Time  ? NA 137 04/08/2021 0718  ? K 3.8 04/08/2021 0718  ? CL 103 04/08/2021 0718  ? CO2 24 04/08/2021 0718  ? GLUCOSE 91 04/08/2021 0718  ? BUN 17 04/08/2021 0718  ? CREATININE 4.68 (H) 04/08/2021 0131  ? CREATININE 1.39 (H) 07/17/2017 1012  ? CALCIUM 8.9 04/08/2021 0718  ? PROT 6.2 (L) 04/07/2021 1040  ? ALBUMIN 2.8 (L) 04/08/2021 4388  ? AST 14 (L) 04/07/2021 1040  ? ALT 13 04/07/2021 1040  ? ALKPHOS 78 04/07/2021 1040  ? BILITOT 0.4 04/07/2021 1040  ? GFRNONAA 10 (L) 04/08/2021 8757  ? GFRNONAA 39 (L) 07/17/2017 1012  ? GFRAA 23 (L) 06/25/2019 1853  ? GFRAA 45 (L) 07/17/2017 1012  ? ?cEEG 04-08-11 to 04-08-21 ?IMPRESSION: ?This study is within normal limits. No seizures or epileptiform discharges were seen throughout the recording. ? ?Imaging ?I have reviewed images in epic and the results pertinent to this consultation are: ?CT head with no acute changes ? ?Assessment:  ?71 year old woman past history of ESRD, CHF, diabetes, hypertension, hyperlipidemia, seizures, prior stroke, peripheral vascular disease and blindness presented to the ED for evaluation of breakthrough seizures. ?Outpatient medications Lamictal 200 3 times daily ?Despite increasing medication, seizure frequency is increased ?Sees Dr. Ellouise Newer at [facility] neurology who had plan to do inpatient video monitoring if continues to have breakthrough seizure.  With that plan, she was admitted for prolonged continuous EEG. ?No seizures overnight ?Exam unremarkable for acute process and nonfocal. ?Supposed to get dialysis today-would continue on EEG  while being dialyzed as a lot of she has her episodes were reported peri-dialysis. ?Last approximately 16 hours of continuous EEG with no seizures-normal EEG. ? ?Impression: ?Evaluate for breakthrough seizure ?Evaluate for underlying electrographic abnormalities and seizure characterization by continuous EEG ? ?Recommendations: ?Continue to hold AEDs ?cEEG for another day or two off of meds ?Dialysis as planned - would like to keep on EEG for the duration of dialysis as a lot of these episdoes have happened at or around dialysis. ?D/W Dr Sloan Leiter and Dr Jonnie Finner via secure chat. ? ?-- ?Amie Portland, MD ?Neurologist ?[facility] ?Pager: [phone] ?

## 2021-04-08 NOTE — Progress Notes (Addendum)
Julie Brewer received to room [phone] from ED with Seizure [facility]).   ? ?Pt is blind in both eyes, but does well at home and is independent of her ADL's.  She only uses a visual aid cane with ambulation.  She is alert and oriented and appropriate.  She demonstrated how to use the call bell to summon staff for assistance.  EEG initiated on arrival to room.   ? ? ? 04/07/21 2117  ?Vitals  ?BP (!) 166/82  ?MAP (mmHg) 103  ?BP Location Right Arm  ?BP Method Automatic  ?Patient Position (if appropriate) Lying  ?Pulse Rate 91  ?Pulse Rate Source Monitor  ?ECG Heart Rate 91  ?Resp 15  ?Level of Consciousness  ?Level of Consciousness Alert  ?MEWS COLOR  ?MEWS Score Color Green  ?Oxygen Therapy  ?SpO2 97 %  ?O2 Device Room Air  ?Pain Assessment  ?Pain Scale 0-10  ?Pain Score 0  ?POSS Scale (Pasero Opioid Sedation Scale)  ?POSS *See Group Information* 1-Acceptable,Awake and alert  ?Height and Weight  ?Height '5\' 3"'$  (1.6 m)  ?Weight 60.9 kg  ?Type of Scale Used Bed  ?Type of Weight Actual  ?BSA (Calculated - sq m) 1.64 sq meters  ?BMI (Calculated) 23.78  ?Weight in (lb) to have BMI = 25 140.8  ?ECG Monitoring  ?Telemetry Box Number  ?(Wall monitor)  ?Tele Box Verification Completed by Second Verifier Completed  ?Glasgow Coma Scale  ?Eye Opening 4  ?Best Verbal Response (NON-intubated) 5  ?Best Motor Response 6  ?Glasgow Coma Scale Score 15  ?MEWS Score  ?MEWS Temp 0  ?MEWS Systolic 0  ?MEWS Pulse 0  ?MEWS RR 0  ?MEWS LOC 0  ?MEWS Score 0  ? ? ? ?Patient oriented to room and unit.  Call bell and personal items in reach.  Admission history completed.  Plan of care initiated. ?Ayesha Mohair BSN RN CMSRN ?04/08/2021, 12:35 AM ? ? ? ? ?

## 2021-04-08 NOTE — Consult Note (Signed)
Renal Service Consult Note [facility]  Julie Brewer 04/08/2021 [name], MD Requesting Physician: Dr Nena Alexander  Reason for Consult: ESRD pt w/ seizures HPI: The patient is a 71 y.o. year-old w/ hx of poor vision, CHDF, ESRD on HD, DM2, HL, HTN, anemia, PAD, CVA who presented for seizures x 2-3 weeks.  Taking lamictal but not helping.  Pt is admitted for eval of possible seizures.  Asked to see for ESRD.    Pt seen in room, she is getting continuous EEG monitoring. Pt states that most of the seizures happened after a HD session. But the one that happened Wed (3d ago) was not HD related, on a non-HD day. Seizures cause LOC, pt not sure if she has convulsions or not. She does endorse incontinence of urine and stool w/ these events.    Pt on HD x 2 yrs. TTS HD. Lives w/ her dtr and her dtr's family. +smoker, no etoh.  Does well on HD, no complications.    ROS - denies CP, no joint pain, no HA, no blurry vision, no rash, no diarrhea, no nausea/ vomiting, no dysuria, no difficulty voiding   Past Medical History  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   CHF (congestive heart failure) (HCC)    Chronic kidney disease    Tu/Th/Sa   Diabetes mellitus    Type II per Dr [name]  (patient said type I)   Fibroid    GERD (gastroesophageal reflux disease)    Glaucoma    Hyperlipidemia    Hypertension    Iron deficiency anemia 03/09/2016   Peripheral vascular disease ([place])    Pneumonia 2006   PONV (postoperative nausea and vomiting)    Shortness of breath dyspnea    with exdrtion, "Walkling too fast"   Stroke ([place])    no residual   Past Surgical History  Past Surgical History:  Procedure Laterality Date   ABDOMINAL HYSTERECTOMY     AV FISTULA PLACEMENT Left 04/27/2019   Procedure: INSERTION OF ARTERIOVENOUS (AV) GORE-TEX GRAFT LEFT  UPPER ARM;  Surgeon: Angelia Mould, MD;  Location: [place];  Service: Vascular;  Laterality: Left;   BIOPSY  06/10/2017   Procedure: BIOPSY;  Surgeon: Ronnette Juniper, MD;  Location: [facility] ENDOSCOPY;  Service: Gastroenterology;;   BREAST BIOPSY Right    CERVICAL FUSION     with graft from hip   COLONOSCOPY  July 09, 2012   DIRECT LARYNGOSCOPY N/A 06/07/2014   Procedure: DIRECT LARYNGOSCOPY with BIOPSY and EXCISION VOLLECULAR CYST;  Surgeon: Ruby Cola, MD;  Location: [facility] OR;  Service: ENT;  Laterality: N/A;   ESOPHAGOGASTRODUODENOSCOPY (EGD) WITH PROPOFOL Left 06/10/2017   Procedure: ESOPHAGOGASTRODUODENOSCOPY (EGD) WITH PROPOFOL;  Surgeon: Ronnette Juniper, MD;  Location: [place];  Service: Gastroenterology;  Laterality: Left;   FLEXIBLE SIGMOIDOSCOPY Left 06/10/2017   Procedure: FLEXIBLE SIGMOIDOSCOPY;  Surgeon: Ronnette Juniper, MD;  Location: [place];  Service: Gastroenterology;  Laterality: Left;   LOOP RECORDER INSERTION N/A 06/20/2016   Procedure: Loop Recorder Insertion;  Surgeon: [name], MD;  Location: [place] CV LAB;  Service: Cardiovascular;  Laterality: N/A;   REFRACTIVE SURGERY Bilateral    both eyes   SPINE SURGERY     lumbar   Family History  Family History  Problem Relation Age of Onset   Cancer Mother  Heart disease Mother    Diabetes Father    Cancer Brother    Cancer Brother    Throat cancer Brother    Social History  reports that she has been smoking cigarettes. She has a 30.00 pack-year smoking history. She has never used smokeless tobacco. She reports that she does not currently use alcohol. She reports that she does not currently use drugs after having used the following drugs: Methylphenidate. Allergies  Allergies  Allergen Reactions   Morphine And Related Other (See Comments)    Hallucinations    Penicillins Swelling and Rash    Throat swelling Did it involve swelling of the face/tongue/throat, SOB, or low BP? Yes Did it involve sudden or  severe rash/hives, skin peeling, or any reaction on the inside of your mouth or nose? Yes Did you need to seek medical attention at a hospital or doctor's office? Yes When did it last happen?   young child    If all above answers are NO, may proceed with cephalosporin use.   Home medications Prior to Admission medications   Medication Sig Start Date End Date Taking? Authorizing Provider  acetaminophen (TYLENOL) 325 MG tablet Take 2 tablets (650 mg total) by mouth every 6 (six) hours as needed for mild pain (or Fever >/= 101). 02/20/19  Yes Domenic Polite, MD  amLODipine (NORVASC) 5 MG tablet Take 5 mg by mouth daily. 04/06/21  Yes [provider]  aspirin 325 MG EC tablet Take 325 mg by mouth daily.    Yes [provider]  atorvastatin (LIPITOR) 20 MG tablet Take 1 tablet by mouth once daily Patient taking differently: Take 20 mg by mouth daily. 03/20/21  Yes Elayne Snare, MD  bisacodyl (DULCOLAX) 5 MG EC tablet Take 5 mg by mouth daily as needed for moderate constipation.   Yes [provider]  cilostazol (PLETAL) 100 MG tablet Take 1 tablet by mouth twice daily Patient taking differently: Take 100 mg by mouth 2 (two) times daily. 02/01/21  Yes Waynetta Sandy, MD  docusate sodium (COLACE) 100 MG capsule Take 200 mg by mouth daily as needed for mild constipation.   Yes [provider]  Doxercalciferol (HECTOROL IV) Inject 1 Dose into the vein See admin instructions. During dialysis - Tuesday, Thursday and Saturday 11/22/20 11/21/21 Yes [provider]  ferrous sulfate 325 (65 FE) MG tablet Take 650 mg by mouth See admin instructions. On dialysis days - Tuesday,Thursday and Saturday   Yes [provider]  gabapentin (NEURONTIN) 100 MG capsule TAKE 1 CAPSULE BY MOUTH TWICE DAILY AS NEEDED Patient taking differently: 100 mg 2 (two) times daily. 01/11/21  Yes Elayne Snare, MD  heparin 1000 unit/mL SOLN injection 1,000 Units by Dialysis  route See admin instructions. During dialysis 11/15/20 11/14/21 Yes [provider]  insulin glargine (LANTUS) 100 UNIT/ML injection INJECT 16 UNITS SUBCUTANEOUSLY ONCE DAILY IN THE MORNING Patient taking differently: 12 Units daily. 02/09/21  Yes Renato Shin, MD  insulin regular (NOVOLIN R) 100 units/mL injection Inject 10 Units into the skin 2 (two) times daily before a meal.   Yes [provider]  iron sucrose in sodium chloride 0.9 % 100 mL Inject 100 mg into the vein See admin instructions. During dialysis 11/26/20 11/25/21 Yes [provider]  lamoTRIgine (LAMICTAL) 200 MG tablet Take 1 tablet twice a day Patient taking differently: Take 200 mg by mouth 2 (two) times daily. 03/31/21  Yes Cameron Sprang, MD  lidocaine-prilocaine (EMLA) cream 1 application.  daily as needed (port access). 08/10/19  Yes [provider]  Methoxy PEG-Epoetin Beta (MIRCERA IJ) Inject 1 Dose into the vein See admin instructions. During dialysis 06/30/20 06/29/21 Yes [provider]  omeprazole (PRILOSEC) 20 MG capsule Take 1 capsule by mouth once daily Patient taking differently: 20 mg daily. TAKE 1 CAPSULE BY MOUTH ONCE DAILY 03/31/21  Yes Elayne Snare, MD  triamcinolone ointment (KENALOG) 0.1 % 1 application. daily. 09/28/20  Yes [provider]  Vitamin D, Ergocalciferol, (DRISDOL) 1.25 MG (50000 UNIT) CAPS capsule Take 1 capsule by mouth once a week Patient taking differently: Take 50,000 Units by mouth See admin instructions. During dialysis 03/16/19  Yes Elayne Snare, MD  Blood Glucose Monitoring Suppl (FREESTYLE FREEDOM) KIT Use to check blood sugar once a day dx code 02/05/20   Elayne Snare, MD  glucose blood (FREESTYLE LITE) test strip USE AS INSTRUCTED TO CHECK BLOOD SUGAR ONCE DAILY. 05/12/19   Elayne Snare, MD  Insulin Pen Needle 31G X 5 MM MISC Use with pen 02/17/20   Elayne Snare, MD     Vitals:   04/07/21 2117 04/07/21 2326 04/08/21 0316 04/08/21 0827  BP: (!)  166/82 (!) 157/54 (!) 150/70 (!) 173/69  Pulse: 91 91 92 96  Resp: 15 20 16 18   Temp:  98.7 F (37.1 C) 98.7 F (37.1 C) 97.9 F (36.6 C)  TempSrc:  Oral Oral Oral  SpO2: 97% 93% 93% 96%  Weight: 60.9 kg     Height: 5' 3"  (1.6 m)      Exam Gen alert, no distress No rash, cyanosis or gangrene Sclera anicteric, throat clear  No jvd or bruits Chest clear bilat to bases, no rales/ wheezing RRR no MRG Abd soft ntnd no mass or ascites +bs MS no joint effusions or deformity Ext no LE or UE edema, no wounds or ulcers Neuro is alert, Ox 3 , nf   LUA AVG+bruit      Home meds include - norvasc 5, lipitor, asa, pletal, neurontin 100 bid, insulin lantus/ regular, lamcital, prilosec, prns/ vits / supps     OP HD: [place] HD 3/09   3h  400/1.5   59.6kg   2/2 bath   LUA AVG  Heparin 4500  - hect 10 ug IV tiw  - mircera 30 mcg q4, last 3/02, due 3/16   Assessment/ Plan: Seizure episodes - per pmd and neurology. Is on continuous EEG monitoring.  ESRD - on HD TTS. Due for HD today. Will need to have HD in the room, plan for later today or tonight. K+ is low.  Volume - BP's and volume are good, no vol overload HTN - cont norvasc DM2 - on insulin per pmd Anemia ckd - Hb 8-10 here, follow. ESA due on 3/16.  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 Cont vdra.       Kelly Splinter  MD 04/08/2021, 11:33 AM Recent Labs  Lab 04/07/21 1040 04/08/21 0718  HGB 10.3* 8.7*  ALBUMIN 3.4* 2.8*  CALCIUM 9.5 8.9  PHOS  --  4.9*  CREATININE 4.19* 4.68*  K 3.2* 3.8

## 2021-04-08 NOTE — Progress Notes (Signed)
PROGRESS NOTE        PATIENT DETAILS Name: Julie Brewer Age: 71 y.o. Sex: female Date of Birth: Sep 16, 1950 Admit Date: 04/07/2021 Admitting Physician Lequita Halt, MD VAP:OLIDCVUDTH, Anastasia Pall, MD  Brief Summary: Patient is a 71 y.o.  female with history of ESRD on HD, seizure disorder, HFpEF, DM-2, HTN, legally blind-who presented to the hospital with breakthrough seizures.  Apparently patient has had several medication adjustment in the outpatient setting-with increasing frequency of seizures.  Evaluated by neurology-with plans to to continue prolonged LTM EEG for several days.  See below for further details.    Significant events: 3/10>> admit to [facility] for breakthrough seizures  Significant studies: 3/10>> CT head: No acute intracranial abnormality.  Significant microbiology data: 3/10>> COVID/influenza PCR: Negative  Procedures: None  Consults: Neurology, nephrology  Subjective: Lying comfortably in bed-denies any chest pain or shortness of breath.  Objective: Vitals: Blood pressure (!) 173/69, pulse 96, temperature 97.9 F (36.6 C), temperature source Oral, resp. rate 18, height 5' 3"  (1.6 m), weight 60.9 kg, SpO2 96 %.   Exam: Gen Exam:Alert awake-not in any distress HEENT:atraumatic, normocephalic Chest: B/L clear to auscultation anteriorly CVS:S1S2 regular Abdomen:soft non tender, non distended Extremities:no edema Neurology: Non focal Skin: no rash  Pertinent Labs/Radiology: CBC Latest Ref Rng & Units 04/08/2021 04/07/2021 06/25/2019  WBC 4.0 - 10.5 K/uL 7.2 8.5 11.9(H)  Hemoglobin 12.0 - 15.0 g/dL 8.7(L) 10.3(L) 11.0(L)  Hematocrit 36.0 - 46.0 % 26.9(L) 32.3(L) 35.7(L)  Platelets 150 - 400 K/uL 213 235 231    Lab Results  Component Value Date   NA 137 04/08/2021   K 3.8 04/08/2021   CL 103 04/08/2021   CO2 24 04/08/2021      Assessment/Plan: Breakthrough seizures: Apparently mostly occurs with  dialysis/postdialysis-Lamictal on hold-neurology following with prolonged LTM EEG planned-especially around hemodialysis.  ESRD on HD TTS: Nephrology consulted-defer to nephrology.  Normocytic anemia: Due to CKD-follow Hb-transfuse if <7.  Defer Aranesp/IV iron to nephrology service  Chronic HFpEF: Volume status stable-fluid removal with HD.  HTN: BP stable-continue Norvasc  HLD: Continue statin  PAD: Appears stable-continue cilostazol, aspirin and statin  DM-2 (A1c 6.1 on 3/10): CBGs relatively stable-continue 7 units of Semglee daily and SSI.  Recent Labs    04/07/21 1651 04/07/21 2136 04/08/21 0825  GLUCAP 100* 194* 62    Legally blind  BMI: Estimated body mass index is 23.77 kg/m as calculated from the following:   Height as of this encounter: 5' 3"  (1.6 m).   Weight as of this encounter: 60.9 kg.   Code status:   Code Status: DNR   DVT Prophylaxis: heparin injection 5,000 Units Start: 04/07/21 1545   Family Communication: None at bedside   Disposition Plan: Status is: Observation The patient will require care spanning > 2 midnights and should be moved to inpatient because: Prolonged LTM EEG in progress-plan for several days-not stable for discharge.   Planned Discharge Destination:Home   Diet: Diet Order             Diet renal/carb modified with fluid restriction Diet-HS Snack? Nothing; Fluid restriction: 1200 mL Fluid; Room service appropriate? Yes; Fluid consistency: Thin  Diet effective now                     Antimicrobial agents: Anti-infectives (From admission, onward)  None        MEDICATIONS: Scheduled Meds:  amLODipine  10 mg Oral Daily   aspirin  325 mg Oral Daily   atorvastatin  20 mg Oral Daily   chlorhexidine gluconate (MEDLINE KIT)  15 mL Mouth Rinse BID   Chlorhexidine Gluconate Cloth  6 each Topical Daily   cilostazol  100 mg Oral BID   docusate sodium  200 mg Oral Daily   feeding supplement (NEPRO CARB STEADY)  237  mL Oral BID BM   ferrous sulfate  650 mg Oral Daily   heparin  5,000 Units Subcutaneous Q8H   insulin aspart  0-5 Units Subcutaneous QHS   insulin aspart  0-6 Units Subcutaneous TID WC   insulin glargine-yfgn  7 Units Subcutaneous Daily   mouth rinse  15 mL Mouth Rinse Q4H   pantoprazole  40 mg Oral Daily   Continuous Infusions: PRN Meds:.acetaminophen, bisacodyl, hydrALAZINE, LORazepam   I have personally reviewed following labs and imaging studies  LABORATORY DATA: CBC: Recent Labs  Lab 04/07/21 1040 04/08/21 0718  WBC 8.5 7.2  NEUTROABS 6.5  --   HGB 10.3* 8.7*  HCT 32.3* 26.9*  MCV 85.2 83.8  PLT 235 832    Basic Metabolic Panel: Recent Labs  Lab 04/07/21 1040 04/08/21 0718  NA 136 137  K 3.2* 3.8  CL 94* 103  CO2 30 24  GLUCOSE 200* 91  BUN 11 17  CREATININE 4.19* 4.68*  CALCIUM 9.5 8.9  PHOS  --  4.9*    GFR: Estimated Creatinine Clearance: 9.5 mL/min (A) (by C-G formula based on SCr of 4.68 mg/dL (H)).  Liver Function Tests: Recent Labs  Lab 04/07/21 1040 04/08/21 0718  AST 14*  --   ALT 13  --   ALKPHOS 78  --   BILITOT 0.4  --   PROT 6.2*  --   ALBUMIN 3.4* 2.8*   No results for input(s): LIPASE, AMYLASE in the last 168 hours. No results for input(s): AMMONIA in the last 168 hours.  Coagulation Profile: No results for input(s): INR, PROTIME in the last 168 hours.  Cardiac Enzymes: No results for input(s): CKTOTAL, CKMB, CKMBINDEX, TROPONINI in the last 168 hours.  BNP (last 3 results) No results for input(s): PROBNP in the last 8760 hours.  Lipid Profile: No results for input(s): CHOL, HDL, LDLCALC, TRIG, CHOLHDL, LDLDIRECT in the last 72 hours.  Thyroid Function Tests: No results for input(s): TSH, T4TOTAL, FREET4, T3FREE, THYROIDAB in the last 72 hours.  Anemia Panel: No results for input(s): VITAMINB12, FOLATE, FERRITIN, TIBC, IRON, RETICCTPCT in the last 72 hours.  Urine analysis:    Component Value Date/Time    COLORURINE YELLOW 02/10/2019 Reform 02/10/2019 1223   LABSPEC 1.011 02/10/2019 1223   LABSPEC 1.010 03/09/2016 1400   PHURINE 5.0 02/10/2019 1223   GLUCOSEU NEGATIVE 02/10/2019 1223   GLUCOSEU NEGATIVE 03/11/2018 0958   GLUCOSEU Negative 03/09/2016 1400   HGBUR SMALL (A) 02/10/2019 1223   BILIRUBINUR NEGATIVE 02/10/2019 1223   BILIRUBINUR Negative 03/09/2016 1400   KETONESUR NEGATIVE 02/10/2019 1223   PROTEINUR 100 (A) 02/10/2019 1223   UROBILINOGEN 0.2 03/11/2018 0958   UROBILINOGEN 0.2 03/09/2016 1400   NITRITE NEGATIVE 02/10/2019 1223   LEUKOCYTESUR NEGATIVE 02/10/2019 1223   LEUKOCYTESUR Negative 03/09/2016 1400    Sepsis Labs: Lactic Acid, Venous    Component Value Date/Time   LATICACIDVEN 2.0 (HH) 04/07/2021 1227    MICROBIOLOGY: Recent Results (from the past 240 hour(s))  Resp Panel by RT-PCR (Flu A&B, Covid) Nasopharyngeal Swab     Status: None   Collection Time: 04/07/21 11:06 AM   Specimen: Nasopharyngeal Swab; Nasopharyngeal(NP) swabs in vial transport medium  Result Value Ref Range Status   SARS Coronavirus 2 by RT PCR NEGATIVE NEGATIVE Final    Comment: (NOTE) SARS-CoV-2 target nucleic acids are NOT DETECTED.  The SARS-CoV-2 RNA is generally detectable in upper respiratory specimens during the acute phase of infection. The lowest concentration of SARS-CoV-2 viral copies this assay can detect is 138 copies/mL. A negative result does not preclude SARS-Cov-2 infection and should not be used as the sole basis for treatment or other patient management decisions. A negative result may occur with  improper specimen collection/handling, submission of specimen other than nasopharyngeal swab, presence of viral mutation(s) within the areas targeted by this assay, and inadequate number of viral copies(<138 copies/mL). A negative result must be combined with clinical observations, patient history, and epidemiological information. The expected result  is Negative.  Fact Sheet for Patients:  EntrepreneurPulse.com.au  Fact Sheet for Healthcare Providers:  IncredibleEmployment.be  This test is no t yet approved or cleared by the [place] FDA and  has been authorized for detection and/or diagnosis of SARS-CoV-2 by FDA under an Emergency Use Authorization (EUA). This EUA will remain  in effect (meaning this test can be used) for the duration of the COVID-19 declaration under Section 564(b)(1) of the Act, 21 U.S.C.section 360bbb-3(b)(1), unless the authorization is terminated  or revoked sooner.       Influenza A by PCR NEGATIVE NEGATIVE Final   Influenza B by PCR NEGATIVE NEGATIVE Final    Comment: (NOTE) The Xpert Xpress SARS-CoV-2/FLU/RSV plus assay is intended as an aid in the diagnosis of influenza from Nasopharyngeal swab specimens and should not be used as a sole basis for treatment. Nasal washings and aspirates are unacceptable for Xpert Xpress SARS-CoV-2/FLU/RSV testing.  Fact Sheet for Patients: EntrepreneurPulse.com.au  Fact Sheet for Healthcare Providers: IncredibleEmployment.be  This test is not yet approved or cleared by the [place] FDA and has been authorized for detection and/or diagnosis of SARS-CoV-2 by FDA under an Emergency Use Authorization (EUA). This EUA will remain in effect (meaning this test can be used) for the duration of the COVID-19 declaration under Section 564(b)(1) of the Act, 21 U.S.C. section 360bbb-3(b)(1), unless the authorization is terminated or revoked.  Performed at [facility], [place] [address], [place], [place] [address]     RADIOLOGY STUDIES/RESULTS: CT Head Wo Contrast  Result Date: 04/07/2021 CLINICAL DATA:  Altered mental status. EXAM: CT HEAD WITHOUT CONTRAST TECHNIQUE: Contiguous axial images were obtained from the base of the skull through the vertex without intravenous contrast.  RADIATION DOSE REDUCTION: This exam was performed according to the departmental dose-optimization program which includes automated exposure control, adjustment of the mA and/or kV according to patient size and/or use of iterative reconstruction technique. COMPARISON:  MRI brain dated March 16, 2020. CT head dated February 06, 2019. FINDINGS: Brain: No evidence of acute infarction, hemorrhage, hydrocephalus, extra-axial collection or mass lesion/mass effect. Stable mild atrophy and moderate chronic microvascular ischemic changes. Vascular: Atherosclerotic vascular calcification of the carotid siphons. No hyperdense vessel. Skull: Normal. Negative for fracture or focal lesion. Sinuses/Orbits: No acute finding. Other: None. IMPRESSION: 1. No acute intracranial abnormality. 2. Stable mild atrophy and moderate chronic microvascular ischemic changes. Electronically Signed   By: Titus Dubin M.D.   On: 04/07/2021 11:43   Overnight EEG with video  Result Date: 04/07/2021 Lora Havens, MD     04/08/2021  9:13 AM Patient Name: Julie Brewer MRN: [id number] Epilepsy Attending: Lora Havens Referring Physician/Provider: Amie Portland, MD Duration:  04/07/2021 1628 to 04/08/2021 0915 Patient history:  71 y.o. female with past medical history of ESRD (T/TH/S), CHF, DM, HTN, HLD, seizures, prior stroke, PVD, and blindness who presents to the [facility] for evaluation of seizure this am. Home Lamictal dose was increased to 252m BID this past Friday due to increasing frequency of seizures despite compliance to medications.  Seizures usually happen around the time dialysis. EEG to evaluate for seizure Level of alertness: Awake, asleep AEDs during EEG study: LTG Technical aspects: This EEG study was done with scalp electrodes positioned according to the 10-20 International system of electrode placement. Electrical activity was acquired at a sampling rate of 500Hz  and reviewed with a high frequency filter of 70Hz  and a  low frequency filter of 1Hz . EEG data were recorded continuously and digitally stored. Description: The posterior dominant rhythm consists of 8 Hz activity of moderate voltage (25-35 uV) seen predominantly in posterior head regions, symmetric and reactive to eye opening and eye closing. Sleep was characterized by vertex waves, sleep spindles (12 to 14 Hz), maximal frontocentral region. Hyperventilation and photic stimulation were not performed.   IMPRESSION: This study is within normal limits. No seizures or epileptiform discharges were seen throughout the recording. Priyanka OBarbra Sarks    LOS: 0 days   SOren Binet MD  [facility]    To contact the attending provider between 7A-7P or the covering provider during after hours 7P-7A, please log into the web site [email or web address] and access using universal [place] password for that web site. If you do not have the password, please call the hospital operator.  04/08/2021, 11:02 AM

## 2021-04-09 DIAGNOSIS — I10 Essential (primary) hypertension: Secondary | ICD-10-CM | POA: Diagnosis not present

## 2021-04-09 DIAGNOSIS — N186 End stage renal disease: Secondary | ICD-10-CM | POA: Diagnosis not present

## 2021-04-09 DIAGNOSIS — R569 Unspecified convulsions: Secondary | ICD-10-CM | POA: Diagnosis not present

## 2021-04-09 LAB — COMPREHENSIVE METABOLIC PANEL
ALT: 16 U/L (ref 0–44)
AST: 18 U/L (ref 15–41)
Albumin: 2.5 g/dL — ABNORMAL LOW (ref 3.5–5.0)
Alkaline Phosphatase: 59 U/L (ref 38–126)
Anion gap: 8 (ref 5–15)
BUN: 13 mg/dL (ref 8–23)
CO2: 26 mmol/L (ref 22–32)
Calcium: 8.1 mg/dL — ABNORMAL LOW (ref 8.9–10.3)
Chloride: 102 mmol/L (ref 98–111)
Creatinine, Ser: 1.95 mg/dL — ABNORMAL HIGH (ref 0.44–1.00)
GFR, Estimated: 28 mL/min — ABNORMAL LOW (ref >60)
Glucose, Bld: 111 mg/dL — ABNORMAL HIGH (ref 70–99)
Potassium: 4 mmol/L (ref 3.5–5.1)
Sodium: 136 mmol/L (ref 135–145)
Total Bilirubin: 0.4 mg/dL (ref 0.3–1.2)
Total Protein: 4.7 g/dL — ABNORMAL LOW (ref 6.5–8.1)

## 2021-04-09 LAB — CBC WITH DIFFERENTIAL/PLATELET
Abs Immature Granulocytes: 0.08 10*3/uL — ABNORMAL HIGH (ref 0.00–0.07)
Basophils Absolute: 0 10*3/uL (ref 0.0–0.1)
Basophils Relative: 0 %
Eosinophils Absolute: 0.1 10*3/uL (ref 0.0–0.5)
Eosinophils Relative: 1 %
HCT: 16.8 % — ABNORMAL LOW (ref 36.0–46.0)
Hemoglobin: 5.5 g/dL — CL (ref 12.0–15.0)
Immature Granulocytes: 1 %
Lymphocytes Relative: 16 %
Lymphs Abs: 1.6 10*3/uL (ref 0.7–4.0)
MCH: 26.8 pg (ref 26.0–34.0)
MCHC: 32.7 g/dL (ref 30.0–36.0)
MCV: 82 fL (ref 80.0–100.0)
Monocytes Absolute: 0.7 10*3/uL (ref 0.1–1.0)
Monocytes Relative: 7 %
Neutro Abs: 7.4 10*3/uL (ref 1.7–7.7)
Neutrophils Relative %: 75 %
Platelets: 184 10*3/uL (ref 150–400)
RBC: 2.05 MIL/uL — ABNORMAL LOW (ref 3.87–5.11)
RDW: 16.7 % — ABNORMAL HIGH (ref 11.5–15.5)
WBC: 9.8 10*3/uL (ref 4.0–10.5)
nRBC: 0.2 % (ref 0.0–0.2)

## 2021-04-09 LAB — GLUCOSE, CAPILLARY
Glucose-Capillary: 113 mg/dL — ABNORMAL HIGH (ref 70–99)
Glucose-Capillary: 121 mg/dL — ABNORMAL HIGH (ref 70–99)
Glucose-Capillary: 115 mg/dL — ABNORMAL HIGH (ref 70–99)
Glucose-Capillary: 117 mg/dL — ABNORMAL HIGH (ref 70–99)

## 2021-04-09 LAB — PREPARE RBC (CROSSMATCH)

## 2021-04-09 MED ORDER — SODIUM CHLORIDE 0.9% IV SOLUTION: Freq: Once | INTRAVENOUS | Status: DC

## 2021-04-09 MED ORDER — PANTOPRAZOLE SODIUM 40 MG IV SOLR
40.0000 mg | Freq: Two times a day (BID) | INTRAVENOUS | Status: DC
  Administered 2021-04-09 – 2021-04-11 (×5): 40 mg via INTRAVENOUS
  Filled 2021-04-09 (×5): qty 10

## 2021-04-09 MED ORDER — SODIUM CHLORIDE 0.9% IV SOLUTION: Freq: Once | INTRAVENOUS | Status: AC

## 2021-04-09 NOTE — Consult Note (Signed)
[facility] Consult  Referring Provider: [facility] hospitalist Primary Care Physician:  Shon Hale, MD Primary Gastroenterologist: [facility]/Dr. Madilyn Fireman  Reason for Consultation: Melena  HPI: Julie Brewer is a 71 y.o. female was admitted on 04/07/2021 with breakthrough seizures.  She is legally blind, has history of end-stage renal disease, is on hemodialysis, has heart failure, diabetes, hypertension.   Patient was noted to have a large black stool with red staining during dialysis today morning at 4 AM and had another such episode again within an hour as per documentation, was about 700 cc of melanotic stool.  Patient denies abdominal pain, nausea, vomiting, acid reflux, heartburn.  Prior GI work-up: Flexible sigmoidoscopy 05/2017, Dr. 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 Pilar Jarvis gastropathy EGD 2018, Dr. Davina Poke Colonoscopy, 2014, Dr. Davina Poke  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   CHF (congestive heart failure) (HCC)    Chronic kidney disease    Tu/Th/Sa   Diabetes mellitus    Type II per Dr Lucianne Muss  (patient said type I)   Fibroid    GERD (gastroesophageal reflux disease)    Glaucoma    Hyperlipidemia    Hypertension    Iron deficiency anemia 03/09/2016   Peripheral vascular disease (HCC)    Pneumonia 2006   PONV (postoperative nausea and vomiting)    Shortness of breath dyspnea    with exdrtion, "Walkling too fast"   Stroke [facility])    no residual    Past Surgical History:  Procedure Laterality Date   ABDOMINAL HYSTERECTOMY     AV FISTULA PLACEMENT Left 04/27/2019   Procedure: INSERTION OF ARTERIOVENOUS (AV) GORE-TEX GRAFT LEFT UPPER ARM;  Surgeon: Chuck Hint, MD;  Location: [facility] OR;  Service: Vascular;  Laterality: Left;   BIOPSY  06/10/2017    Procedure: BIOPSY;  Surgeon: Kerin Salen, MD;  Location: [facility] ENDOSCOPY;  Service: Gastroenterology;;   BREAST BIOPSY Right    CERVICAL FUSION     with graft from hip   COLONOSCOPY  July 09, 2012   DIRECT LARYNGOSCOPY N/A 06/07/2014   Procedure: DIRECT LARYNGOSCOPY with BIOPSY and EXCISION VOLLECULAR CYST;  Surgeon: Melvenia Beam, MD;  Location: [facility] OR;  Service: ENT;  Laterality: N/A;   ESOPHAGOGASTRODUODENOSCOPY (EGD) WITH PROPOFOL Left 06/10/2017   Procedure: ESOPHAGOGASTRODUODENOSCOPY (EGD) WITH PROPOFOL;  Surgeon: Kerin Salen, MD;  Location: [facility] ENDOSCOPY;  Service: Gastroenterology;  Laterality: Left;   FLEXIBLE SIGMOIDOSCOPY Left 06/10/2017   Procedure: FLEXIBLE SIGMOIDOSCOPY;  Surgeon: Kerin Salen, MD;  Location: [facility] ENDOSCOPY;  Service: Gastroenterology;  Laterality: Left;   LOOP RECORDER INSERTION N/A 06/20/2016   Procedure: Loop Recorder Insertion;  Surgeon: Thurmon Fair, MD;  Location: [facility] INVASIVE CV LAB;  Service: Cardiovascular;  Laterality: N/A;   REFRACTIVE SURGERY Bilateral    both eyes   SPINE SURGERY     lumbar    Prior to Admission medications   Medication Sig Start Date End Date Taking? Authorizing Provider  acetaminophen (TYLENOL) 325 MG tablet Take 2 tablets (650 mg total) by mouth every 6 (six) hours as needed for mild pain (or Fever >/= 101). 02/20/19  Yes Zannie Cove, MD  amLODipine (NORVASC) 5 MG tablet Take 5 mg by mouth daily. 04/06/21  Yes [provider]  aspirin 325 MG EC tablet Take 325 mg by mouth daily.    Yes  [provider]  atorvastatin (LIPITOR) 20 MG tablet Take 1 tablet by mouth once daily Patient taking differently: Take 20 mg by mouth daily. 03/20/21  Yes Reather Littler, MD  bisacodyl (DULCOLAX) 5 MG EC tablet Take 5 mg by mouth daily as needed for moderate constipation.   Yes [provider]  cilostazol (PLETAL) 100 MG tablet Take 1 tablet by mouth twice daily Patient taking differently: Take 100 mg by mouth 2 (two) times  daily. 02/01/21  Yes Maeola Harman, MD  docusate sodium (COLACE) 100 MG capsule Take 200 mg by mouth daily as needed for mild constipation.   Yes [provider]  Doxercalciferol (HECTOROL IV) Inject 1 Dose into the vein See admin instructions. During dialysis - Tuesday, Thursday and Saturday 11/22/20 11/21/21 Yes [provider]  ferrous sulfate 325 (65 FE) MG tablet Take 650 mg by mouth See admin instructions. On dialysis days - Tuesday,Thursday and Saturday   Yes [provider]  gabapentin (NEURONTIN) 100 MG capsule TAKE 1 CAPSULE BY MOUTH TWICE DAILY AS NEEDED Patient taking differently: 100 mg 2 (two) times daily. 01/11/21  Yes Reather Littler, MD  heparin 1000 unit/mL SOLN injection 1,000 Units by Dialysis route See admin instructions. During dialysis 11/15/20 11/14/21 Yes [provider]  insulin glargine (LANTUS) 100 UNIT/ML injection INJECT 16 UNITS SUBCUTANEOUSLY ONCE DAILY IN THE MORNING Patient taking differently: 12 Units daily. 02/09/21  Yes Romero Belling, MD  insulin regular (NOVOLIN R) 100 units/mL injection Inject 10 Units into the skin 2 (two) times daily before a meal.   Yes [provider]  iron sucrose in sodium chloride 0.9 % 100 mL Inject 100 mg into the vein See admin instructions. During dialysis 11/26/20 11/25/21 Yes [provider]  lamoTRIgine (LAMICTAL) 200 MG tablet Take 1 tablet twice a day Patient taking differently: Take 200 mg by mouth 2 (two) times daily. 03/31/21  Yes Van Clines, MD  lidocaine-prilocaine (EMLA) cream 1 application. daily as needed (port access). 08/10/19  Yes [provider]  Methoxy PEG-Epoetin Beta (MIRCERA IJ) Inject 1 Dose into the vein See admin instructions. During dialysis 06/30/20 06/29/21 Yes [provider]  omeprazole (PRILOSEC) 20 MG capsule Take 1 capsule by mouth once daily Patient taking differently: 20 mg daily. TAKE 1 CAPSULE BY MOUTH ONCE DAILY 03/31/21  Yes  Reather Littler, MD  triamcinolone ointment (KENALOG) 0.1 % 1 application. daily. 09/28/20  Yes [provider]  Vitamin D, Ergocalciferol, (DRISDOL) 1.25 MG (50000 UNIT) CAPS capsule Take 1 capsule by mouth once a week Patient taking differently: Take 50,000 Units by mouth See admin instructions. During dialysis 03/16/19  Yes Reather Littler, MD  Blood Glucose Monitoring Suppl (FREESTYLE FREEDOM) KIT Use to check blood sugar once a day dx code 02/05/20   Reather Littler, MD  glucose blood (FREESTYLE LITE) test strip USE AS INSTRUCTED TO CHECK BLOOD SUGAR ONCE DAILY. 05/12/19   Reather Littler, MD  Insulin Pen Needle 31G X 5 MM MISC Use with pen 02/17/20   Reather Littler, MD    Current Facility-Administered Medications  Medication Dose Route Frequency Provider Last Rate Last Admin   0.9 %  sodium chloride infusion (Manually program via Guardrails IV Fluids)   Intravenous Once Shalhoub, Deno Lunger, MD       acetaminophen (TYLENOL) tablet 650 mg  650 mg Oral Q6H PRN Mikey College T, MD       atorvastatin (LIPITOR) tablet 20 mg  20 mg Oral Daily Zhang, Ping T,  MD   20 mg at 04/08/21 2206   bisacodyl (DULCOLAX) EC tablet 5 mg  5 mg Oral Daily PRN Mikey College T, MD       chlorhexidine gluconate (MEDLINE KIT) (PERIDEX) 0.12 % solution 15 mL  15 mL Mouth Rinse BID Mikey College T, MD   15 mL at 04/08/21 1610   Chlorhexidine Gluconate Cloth 2 % PADS 6 each  6 each Topical Daily Emeline General, MD   6 each at 04/08/21 9604   Chlorhexidine Gluconate Cloth 2 % PADS 6 each  6 each Topical Q0600 Delano Metz, MD       docusate sodium (COLACE) capsule 200 mg  200 mg Oral Daily Mikey College T, MD   200 mg at 04/07/21 1637   [START ON 04/11/2021] doxercalciferol (HECTOROL) injection 10 mcg  10 mcg Intravenous Q T,Th,Sa-HD Delano Metz, MD       feeding supplement (NEPRO CARB STEADY) liquid 237 mL  237 mL Oral BID BM Mikey College T, MD       ferrous sulfate tablet 650 mg  650 mg Oral Daily Mikey College T, MD   650 mg at 04/08/21  0810   hydrALAZINE (APRESOLINE) tablet 25 mg  25 mg Oral Q6H PRN Mikey College T, MD   25 mg at 04/07/21 1904   insulin aspart (novoLOG) injection 0-5 Units  0-5 Units Subcutaneous QHS Mikey College T, MD       insulin aspart (novoLOG) injection 0-6 Units  0-6 Units Subcutaneous TID WC Emeline General, MD   1 Units at 04/08/21 1658   insulin glargine-yfgn (SEMGLEE) injection 7 Units  7 Units Subcutaneous Daily Mikey College T, MD   7 Units at 04/08/21 0811   LORazepam (ATIVAN) injection 4 mg  4 mg Intravenous Q5 Min x 2 PRN Emeline General, MD       MEDLINE mouth rinse  15 mL Mouth Rinse Q4H Mikey College T, MD       pantoprazole (PROTONIX) injection 40 mg  40 mg Intravenous Q12H Shalhoub, Deno Lunger, MD        Allergies as of 04/07/2021 - Review Complete 04/07/2021  Allergen Reaction Noted   Morphine and related Other (See Comments) 08/03/2011   Penicillins Swelling and Rash 08/03/2011    Family History  Problem Relation Age of Onset   Cancer Mother    Heart disease Mother    Diabetes Father    Cancer Brother    Cancer Brother    Throat cancer Brother     Social History   Socioeconomic History   Marital status: Legally Separated    Spouse name: Not on file   Number of children: Not on file   Years of education: Not on file   Highest education level: Not on file  Occupational History   Not on file  Tobacco Use   Smoking status: Every Day    Packs/day: 1.00    Years: 30.00    Pack years: 30.00    Types: Cigarettes   Smokeless tobacco: Never  Vaping Use   Vaping Use: Never used  Substance and Sexual Activity   Alcohol use: Not Currently    Comment: socially   Drug use: Not Currently    Types: Methylphenidate   Sexual activity: Never    Birth control/protection: Post-menopausal, Surgical    Comment: Hysterectomy  Other Topics Concern   Not on file  Social History Narrative   Right handed   Lives with daughter in a  one story home   Drinks caffeine   Social Determinants of  Health   Financial Resource Strain: Not on file  Food Insecurity: Not on file  Transportation Needs: Not on file  Physical Activity: Not on file  Stress: Not on file  Social Connections: Not on file  Intimate Partner Violence: Not on file    Review of Systems: As per HPI  Physical Exam: Vital signs in last 24 hours: Temp:  [98 F (36.7 C)-99.3 F (37.4 C)] 98.7 F (37.1 C) (03/12 0938) Pulse Rate:  [98-120] 120 (03/12 0938) Resp:  [13-25] 24 (03/12 0938) BP: (63-146)/(40-96) 138/52 (03/12 0938) SpO2:  [90 %-99 %] 97 % (03/12 0938) Weight:  [60 kg-60.5 kg] 60.5 kg (03/12 0615) Last BM Date : 04/08/21  General:   Legally blind, not in distress, pleasant, chronically ill-appearing Head:  Normocephalic and atraumatic. Eyes:  Sclera clear, no icterus.   Prominent pallor  ears:  Normal auditory acuity. Nose:  No deformity, discharge,  or lesions. Mouth:  No deformity or lesions.  Oropharynx pink & moist. Neck:  Supple; no masses or thyromegaly. Lungs:  Clear throughout to auscultation.   No wheezes, crackles, or rhonchi. No acute distress. Heart: Tachycardic Extremities:  Without clubbing or edema. Neurologic:  Alert and  oriented x4;  grossly normal neurologically. Skin:  Intact without significant lesions or rashes. Psych:  Alert and cooperative. Normal mood and affect. Abdomen:  Soft, nontender and nondistended. No masses, hepatosplenomegaly or hernias noted. Normal bowel sounds, without guarding, and without rebound.         Lab Results: Recent Labs    04/07/21 1040 04/08/21 0718 04/09/21 0543  WBC 8.5 7.2 9.8  HGB 10.3* 8.7* 5.5*  HCT 32.3* 26.9* 16.8*  PLT 235 213 184   BMET Recent Labs    04/07/21 1040 04/08/21 0718 04/09/21 0543  NA 136 137 136  K 3.2* 3.8 4.0  CL 94* 103 102  CO2 30 24 26   GLUCOSE 200* 91 111*  BUN 11 17 13   CREATININE 4.19* 4.68* 1.95*  CALCIUM 9.5 8.9 8.1*   LFT Recent Labs    04/09/21 0543  PROT 4.7*  ALBUMIN 2.5*  AST  18  ALT 16  ALKPHOS 59  BILITOT 0.4   PT/INR No results for input(s): LABPROT, INR in the last 72 hours.  Studies/Results: CT Head Wo Contrast  Result Date: 04/07/2021 CLINICAL DATA:  Altered mental status. EXAM: CT HEAD WITHOUT CONTRAST TECHNIQUE: Contiguous axial images were obtained from the base of the skull through the vertex without intravenous contrast. RADIATION DOSE REDUCTION: This exam was performed according to the departmental dose-optimization program which includes automated exposure control, adjustment of the mA and/or kV according to patient size and/or use of iterative reconstruction technique. COMPARISON:  MRI brain dated March 16, 2020. CT head dated February 06, 2019. FINDINGS: Brain: No evidence of acute infarction, hemorrhage, hydrocephalus, extra-axial collection or mass lesion/mass effect. Stable mild atrophy and moderate chronic microvascular ischemic changes. Vascular: Atherosclerotic vascular calcification of the carotid siphons. No hyperdense vessel. Skull: Normal. Negative for fracture or focal lesion. Sinuses/Orbits: No acute finding. Other: None. IMPRESSION: 1. No acute intracranial abnormality. 2. Stable mild atrophy and moderate chronic microvascular ischemic changes. Electronically Signed   By: Obie Dredge M.D.   On: 04/07/2021 11:43   Overnight EEG with video  Result Date: 04/07/2021 Charlsie Quest, MD     04/08/2021  9:13 AM Patient Name: SHANOVIA BUSCHMANN MRN: [id number] Epilepsy Attending: Kristopher Oppenheim  Melynda Ripple Referring Physician/Provider: Milon Dikes, MD Duration:  04/07/2021 1628 to 04/08/2021 0915 Patient history:  71 y.o. female with past medical history of ESRD (T/TH/S), CHF, DM, HTN, HLD, seizures, prior stroke, PVD, and blindness who presents to the [facility] for evaluation of seizure this am. Home Lamictal dose was increased to 200mg  BID this past Friday due to increasing frequency of seizures despite compliance to medications.  Seizures usually happen  around the time dialysis. EEG to evaluate for seizure Level of alertness: Awake, asleep AEDs during EEG study: LTG Technical aspects: This EEG study was done with scalp electrodes positioned according to the 10-20 International system of electrode placement. Electrical activity was acquired at a sampling rate of 500Hz  and reviewed with a high frequency filter of 70Hz  and a low frequency filter of 1Hz . EEG data were recorded continuously and digitally stored. Description: The posterior dominant rhythm consists of 8 Hz activity of moderate voltage (25-35 uV) seen predominantly in posterior head regions, symmetric and reactive to eye opening and eye closing. Sleep was characterized by vertex waves, sleep spindles (12 to 14 Hz), maximal frontocentral region. Hyperventilation and photic stimulation were not performed.   IMPRESSION: This study is within normal limits. No seizures or epileptiform discharges were seen throughout the recording. Priyanka Annabelle Harman    Impression: Melena, hemoglobin was 8.7 yesterday and dropped to 5.5  History of seizures, admitted for breakthrough seizures End-stage renal disease on hemodialysis, BUN 13, creatinine 1.95, GFR 28  Diabetes  Plan: PRBC transfusion, 2 units PRBC have been ordered, has not received transfusion yet  Has been started on pantoprazole 40 mg IV every 12 hours  We will keep patient n.p.o., hopefully with PRBC transfusion her hemodynamics will improve.  If okay from neurology, plan EGD in a.m. with Dr. Levora Angel.  Spoke with her daughter Blima Ledger at [phone]. Updated her with clinical condition, need for PRBC transfusion and diagnostic EGD. She verbalized understanding and consents.   LOS: 1 day   Kerin Salen, MD  04/09/2021, 9:42 AM

## 2021-04-09 NOTE — H&P (View-Only) (Signed)
[facility] Consult  Referring Provider: [facility] Primary Care Physician:  Glenis Smoker, MD Primary Gastroenterologist: [facility]/Dr. Amedeo Plenty  Reason for Consultation: Melena  HPI: Shaelee Forni Ayers-Farrar is a 71 y.o. female was admitted on 04/07/2021 with breakthrough seizures.  She is legally blind, has history of end-stage renal disease, is on hemodialysis, has heart failure, diabetes, hypertension.   Patient was noted to have a large black stool with red staining during dialysis today morning at 4 AM and had another such episode again within an hour as per documentation, was about 700 cc of melanotic stool.  Patient denies abdominal pain, nausea, vomiting, acid reflux, heartburn.  Prior GI work-up: Flexible sigmoidoscopy 05/2017, Dr. 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 Belenda Cruise gastropathy EGD 2018, Dr. Karrie Doffing Colonoscopy, 2014, Dr. Karrie Doffing  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   CHF (congestive heart failure) ([place])    Chronic kidney disease    Tu/Th/Sa   Diabetes mellitus    Type II per Dr [name]  (patient said type I)   Fibroid    GERD (gastroesophageal reflux disease)    Glaucoma    Hyperlipidemia    Hypertension    Iron deficiency anemia 03/09/2016   Peripheral vascular disease ([place])    Pneumonia 2006   PONV (postoperative nausea and vomiting)    Shortness of breath dyspnea    with exdrtion, "Walkling too fast"   Stroke [facility])    no residual    Past Surgical History:  Procedure Laterality Date   ABDOMINAL HYSTERECTOMY     AV FISTULA PLACEMENT Left 04/27/2019   Procedure: INSERTION OF ARTERIOVENOUS (AV) GORE-TEX GRAFT LEFT UPPER ARM;  Surgeon: Angelia Mould, MD;  Location: [place];  Service: Vascular;  Laterality: Left;   BIOPSY  06/10/2017    Procedure: BIOPSY;  Surgeon: Ronnette Juniper, MD;  Location: [place];  Service: Gastroenterology;;   BREAST BIOPSY Right    CERVICAL FUSION     with graft from hip   COLONOSCOPY  July 09, 2012   DIRECT LARYNGOSCOPY N/A 06/07/2014   Procedure: DIRECT LARYNGOSCOPY with BIOPSY and EXCISION VOLLECULAR CYST;  Surgeon: Ruby Cola, MD;  Location: [place];  Service: ENT;  Laterality: N/A;   ESOPHAGOGASTRODUODENOSCOPY (EGD) WITH PROPOFOL Left 06/10/2017   Procedure: ESOPHAGOGASTRODUODENOSCOPY (EGD) WITH PROPOFOL;  Surgeon: Ronnette Juniper, MD;  Location: [place];  Service: Gastroenterology;  Laterality: Left;   FLEXIBLE SIGMOIDOSCOPY Left 06/10/2017   Procedure: FLEXIBLE SIGMOIDOSCOPY;  Surgeon: Ronnette Juniper, MD;  Location: [place];  Service: Gastroenterology;  Laterality: Left;   LOOP RECORDER INSERTION N/A 06/20/2016   Procedure: Loop Recorder Insertion;  Surgeon: [name], MD;  Location: [place] [facility];  Service: Cardiovascular;  Laterality: N/A;   REFRACTIVE SURGERY Bilateral    both eyes   SPINE SURGERY     lumbar    Prior to Admission medications   Medication Sig Start Date End Date Taking? Authorizing Provider  acetaminophen (TYLENOL) 325 MG tablet Take 2 tablets (650 mg total) by mouth every 6 (six) hours as needed for mild pain (or Fever >/= 101). 02/20/19  Yes Domenic Polite, MD  amLODipine (NORVASC) 5 MG tablet Take 5 mg by mouth daily. 04/06/21  Yes [provider]  aspirin 325 MG EC tablet Take 325 mg by mouth daily.    Yes  [provider]  atorvastatin (LIPITOR) 20 MG tablet Take 1 tablet by mouth once daily Patient taking differently: Take 20 mg by mouth daily. 03/20/21  Yes Elayne Snare, MD  bisacodyl (DULCOLAX) 5 MG EC tablet Take 5 mg by mouth daily as needed for moderate constipation.   Yes [provider]  cilostazol (PLETAL) 100 MG tablet Take 1 tablet by mouth twice daily Patient taking differently: Take 100 mg by mouth 2 (two) times  daily. 02/01/21  Yes Waynetta Sandy, MD  docusate sodium (COLACE) 100 MG capsule Take 200 mg by mouth daily as needed for mild constipation.   Yes [provider]  Doxercalciferol (HECTOROL IV) Inject 1 Dose into the vein See admin instructions. During dialysis - Tuesday, Thursday and Saturday 11/22/20 11/21/21 Yes [provider]  ferrous sulfate 325 (65 FE) MG tablet Take 650 mg by mouth See admin instructions. On dialysis days - Tuesday,Thursday and Saturday   Yes [provider]  gabapentin (NEURONTIN) 100 MG capsule TAKE 1 CAPSULE BY MOUTH TWICE DAILY AS NEEDED Patient taking differently: 100 mg 2 (two) times daily. 01/11/21  Yes Elayne Snare, MD  heparin 1000 unit/mL SOLN injection 1,000 Units by Dialysis route See admin instructions. During dialysis 11/15/20 11/14/21 Yes [provider]  insulin glargine (LANTUS) 100 UNIT/ML injection INJECT 16 UNITS SUBCUTANEOUSLY ONCE DAILY IN THE MORNING Patient taking differently: 12 Units daily. 02/09/21  Yes Renato Shin, MD  insulin regular (NOVOLIN R) 100 units/mL injection Inject 10 Units into the skin 2 (two) times daily before a meal.   Yes [provider]  iron sucrose in sodium chloride 0.9 % 100 mL Inject 100 mg into the vein See admin instructions. During dialysis 11/26/20 11/25/21 Yes [provider]  lamoTRIgine (LAMICTAL) 200 MG tablet Take 1 tablet twice a day Patient taking differently: Take 200 mg by mouth 2 (two) times daily. 03/31/21  Yes Cameron Sprang, MD  lidocaine-prilocaine (EMLA) cream 1 application. daily as needed (port access). 08/10/19  Yes [provider]  Methoxy PEG-Epoetin Beta (MIRCERA IJ) Inject 1 Dose into the vein See admin instructions. During dialysis 06/30/20 06/29/21 Yes [provider]  omeprazole (PRILOSEC) 20 MG capsule Take 1 capsule by mouth once daily Patient taking differently: 20 mg daily. TAKE 1 CAPSULE BY MOUTH ONCE DAILY 03/31/21  Yes  Elayne Snare, MD  triamcinolone ointment (KENALOG) 0.1 % 1 application. daily. 09/28/20  Yes [provider]  Vitamin D, Ergocalciferol, (DRISDOL) 1.25 MG (50000 UNIT) CAPS capsule Take 1 capsule by mouth once a week Patient taking differently: Take 50,000 Units by mouth See admin instructions. During dialysis 03/16/19  Yes Elayne Snare, MD  Blood Glucose Monitoring Suppl (FREESTYLE FREEDOM) KIT Use to check blood sugar once a day dx code 02/05/20   Elayne Snare, MD  glucose blood (FREESTYLE LITE) test strip USE AS INSTRUCTED TO CHECK BLOOD SUGAR ONCE DAILY. 05/12/19   Elayne Snare, MD  Insulin Pen Needle 31G X 5 MM MISC Use with pen 02/17/20   Elayne Snare, MD    Current Facility-Administered Medications  Medication Dose Route Frequency Provider Last Rate Last Admin   0.9 %  sodium chloride infusion (Manually program via Guardrails IV Fluids)   Intravenous Once Shalhoub, Sherryll Burger, MD       acetaminophen (TYLENOL) tablet 650 mg  650 mg Oral Q6H PRN Wynetta Fines T, MD       atorvastatin (LIPITOR) tablet 20 mg  20 mg Oral Daily Zhang, Ping T,  MD   20 mg at 04/08/21 2206   bisacodyl (DULCOLAX) EC tablet 5 mg  5 mg Oral Daily PRN Wynetta Fines T, MD       chlorhexidine gluconate (MEDLINE KIT) (PERIDEX) 0.12 % solution 15 mL  15 mL Mouth Rinse BID Wynetta Fines T, MD   15 mL at 04/08/21 3546   Chlorhexidine Gluconate Cloth 2 % PADS 6 each  6 each Topical Daily Lequita Halt, MD   6 each at 04/08/21 5681   Chlorhexidine Gluconate Cloth 2 % PADS 6 each  6 each Topical Q0600 Roney Jaffe, MD       docusate sodium (COLACE) capsule 200 mg  200 mg Oral Daily Wynetta Fines T, MD   200 mg at 04/07/21 1637   [START ON 04/11/2021] doxercalciferol (HECTOROL) injection 10 mcg  10 mcg Intravenous Q T,Th,Sa-HD Roney Jaffe, MD       feeding supplement (NEPRO CARB STEADY) liquid 237 mL  237 mL Oral BID BM Wynetta Fines T, MD       ferrous sulfate tablet 650 mg  650 mg Oral Daily Wynetta Fines T, MD   650 mg at 04/08/21  0810   hydrALAZINE (APRESOLINE) tablet 25 mg  25 mg Oral Q6H PRN Wynetta Fines T, MD   25 mg at 04/07/21 1904   insulin aspart (novoLOG) injection 0-5 Units  0-5 Units Subcutaneous QHS Wynetta Fines T, MD       insulin aspart (novoLOG) injection 0-6 Units  0-6 Units Subcutaneous TID WC Lequita Halt, MD   1 Units at 04/08/21 1658   insulin glargine-yfgn (SEMGLEE) injection 7 Units  7 Units Subcutaneous Daily Wynetta Fines T, MD   7 Units at 04/08/21 0811   LORazepam (ATIVAN) injection 4 mg  4 mg Intravenous Q5 Min x 2 PRN Lequita Halt, MD       MEDLINE mouth rinse  15 mL Mouth Rinse Q4H Wynetta Fines T, MD       pantoprazole (PROTONIX) injection 40 mg  40 mg Intravenous Q12H Shalhoub, Sherryll Burger, MD        Allergies as of 04/07/2021 - Review Complete 04/07/2021  Allergen Reaction Noted   Morphine and related Other (See Comments) 08/03/2011   Penicillins Swelling and Rash 08/03/2011    Family History  Problem Relation Age of Onset   Cancer Mother    Heart disease Mother    Diabetes Father    Cancer Brother    Cancer Brother    Throat cancer Brother     Social History   Socioeconomic History   Marital status: Legally Separated    Spouse name: Not on file   Number of children: Not on file   Years of education: Not on file   Highest education level: Not on file  Occupational History   Not on file  Tobacco Use   Smoking status: Every Day    Packs/day: 1.00    Years: 30.00    Pack years: 30.00    Types: Cigarettes   Smokeless tobacco: Never  Vaping Use   Vaping Use: Never used  Substance and Sexual Activity   Alcohol use: Not Currently    Comment: socially   Drug use: Not Currently    Types: Methylphenidate   Sexual activity: Never    Birth control/protection: Post-menopausal, Surgical    Comment: Hysterectomy  Other Topics Concern   Not on file  Social History Narrative   Right handed   Lives with daughter in a  one story home   Drinks caffeine   Social Determinants of  Health   Financial Resource Strain: Not on file  Food Insecurity: Not on file  Transportation Needs: Not on file  Physical Activity: Not on file  Stress: Not on file  Social Connections: Not on file  Intimate Partner Violence: Not on file    Review of Systems: As per HPI  Physical Exam: Vital signs in last 24 hours: Temp:  [98 F (36.7 C)-99.3 F (37.4 C)] 98.7 F (37.1 C) (03/12 0938) Pulse Rate:  [98-120] 120 (03/12 0938) Resp:  [13-25] 24 (03/12 0938) BP: (63-146)/(40-96) 138/52 (03/12 0938) SpO2:  [90 %-99 %] 97 % (03/12 0938) Weight:  [60 kg-60.5 kg] 60.5 kg (03/12 0615) Last BM Date : 04/08/21  General:   Legally blind, not in distress, pleasant, chronically ill-appearing Head:  Normocephalic and atraumatic. Eyes:  Sclera clear, no icterus.   Prominent pallor  ears:  Normal auditory acuity. Nose:  No deformity, discharge,  or lesions. Mouth:  No deformity or lesions.  Oropharynx pink & moist. Neck:  Supple; no masses or thyromegaly. Lungs:  Clear throughout to auscultation.   No wheezes, crackles, or rhonchi. No acute distress. Heart: Tachycardic Extremities:  Without clubbing or edema. Neurologic:  Alert and  oriented x4;  grossly normal neurologically. Skin:  Intact without significant lesions or rashes. Psych:  Alert and cooperative. Normal mood and affect. Abdomen:  Soft, nontender and nondistended. No masses, hepatosplenomegaly or hernias noted. Normal bowel sounds, without guarding, and without rebound.         Lab Results: Recent Labs    04/07/21 1040 04/08/21 0718 04/09/21 0543  WBC 8.5 7.2 9.8  HGB 10.3* 8.7* 5.5*  HCT 32.3* 26.9* 16.8*  PLT 235 213 184   BMET Recent Labs    04/07/21 1040 04/08/21 0718 04/09/21 0543  NA 136 137 136  K 3.2* 3.8 4.0  CL 94* 103 102  CO2 _0 GLUCOSE 200* 91 111*  BUN _1 CREATININE 4.19* 4.68* 1.95*  CALCIUM 9.5 8.9 8.1*   LFT Recent Labs    04/09/21 0543  PROT 4.7*  ALBUMIN 2.5*  AST  18  ALT 16  ALKPHOS 59  BILITOT 0.4   PT/INR No results for input(s): LABPROT, INR in the last 72 hours.  Studies/Results: CT Head Wo Contrast  Result Date: 04/07/2021 CLINICAL DATA:  Altered mental status. EXAM: CT HEAD WITHOUT CONTRAST TECHNIQUE: Contiguous axial images were obtained from the base of the skull through the vertex without intravenous contrast. RADIATION DOSE REDUCTION: This exam was performed according to the departmental dose-optimization program which includes automated exposure control, adjustment of the mA and/or kV according to patient size and/or use of iterative reconstruction technique. COMPARISON:  MRI brain dated March 16, 2020. CT head dated February 06, 2019. FINDINGS: Brain: No evidence of acute infarction, hemorrhage, hydrocephalus, extra-axial collection or mass lesion/mass effect. Stable mild atrophy and moderate chronic microvascular ischemic changes. Vascular: Atherosclerotic vascular calcification of the carotid siphons. No hyperdense vessel. Skull: Normal. Negative for fracture or focal lesion. Sinuses/Orbits: No acute finding. Other: None. IMPRESSION: 1. No acute intracranial abnormality. 2. Stable mild atrophy and moderate chronic microvascular ischemic changes. Electronically Signed   By: Titus Dubin M.D.   On: 04/07/2021 11:43   Overnight EEG with video  Result Date: 04/07/2021 Lora Havens, MD     04/08/2021  9:13 AM Patient Name: KATTLEYA KUHNERT MRN: [id number] Epilepsy Attending: Cecil Cranker  Hortense Ramal Referring Physician/Provider: Amie Portland, MD Duration:  04/07/2021 1628 to 04/08/2021 0915 Patient history:  71 y.o. female with past medical history of ESRD (T/TH/S), CHF, DM, HTN, HLD, seizures, prior stroke, PVD, and blindness who presents to the [facility] for evaluation of seizure this am. Home Lamictal dose was increased to 241m BID this past Friday due to increasing frequency of seizures despite compliance to medications.  Seizures usually happen  around the time dialysis. EEG to evaluate for seizure Level of alertness: Awake, asleep AEDs during EEG study: LTG Technical aspects: This EEG study was done with scalp electrodes positioned according to the 10-20 International system of electrode placement. Electrical activity was acquired at a sampling rate of _0  and reviewed with a high frequency filter of _1  and a low frequency filter of _2 . EEG data were recorded continuously and digitally stored. Description: The posterior dominant rhythm consists of 8 Hz activity of moderate voltage (25-35 uV) seen predominantly in posterior head regions, symmetric and reactive to eye opening and eye closing. Sleep was characterized by vertex waves, sleep spindles (12 to 14 Hz), maximal frontocentral region. Hyperventilation and photic stimulation were not performed.   IMPRESSION: This study is within normal limits. No seizures or epileptiform discharges were seen throughout the recording. Priyanka OBarbra Sarks   Impression: Melena, hemoglobin was 8.7 yesterday and dropped to 5.5  History of seizures, admitted for breakthrough seizures End-stage renal disease on hemodialysis, BUN 13, creatinine 1.95, GFR 28  Diabetes  Plan: PRBC transfusion, 2 units PRBC have been ordered, has not received transfusion yet  Has been started on pantoprazole 40 mg IV every 12 hours  We will keep patient n.p.o., hopefully with PRBC transfusion her hemodynamics will improve.  If okay from neurology, plan EGD in a.m. with Dr. BAlessandra Bevels  Spoke with her daughter PVerdon Cumminsat [phone] Updated her with clinical condition, need for PRBC transfusion and diagnostic EGD. She verbalized understanding and consents.   LOS: 1 day   ARonnette Juniper MD  04/09/2021, 9:42 AM

## 2021-04-09 NOTE — Progress Notes (Signed)
Patient current IV with blood infusing malpositioned and leaking. IV team consult placed and blood transfusion paused. IV Team Consult order placed.  ?New IV access obtained, blood transfusion restarted at new IV site.  ?

## 2021-04-09 NOTE — Progress Notes (Signed)
Verbal consent obtained from pt for blood transfusion.  She requested I call her daughter and update her on events through the night.  I called her daughter [name] [phone]) and updated her.  She asked that if she is not here when the GI team sees her mother that they call her on this number.   ? ?Ayesha Mohair BSN RN CMSRN ?04/09/2021, 6:55 AM ? ?

## 2021-04-09 NOTE — Progress Notes (Signed)
HOSPITAL MEDICINE OVERNIGHT EVENT NOTE   ? ?Notified by nursing that at approximately 4 AM during hemodialysis patient exhibited a episode of a large amount of melena. ? ?Less than 1 hour later patient exhibited another episode of a similar amount of melena.  Nursing approximates that a total of 700 cc of melanotic stool was observed total. ? ?Patient denies abdominal pain.  Stat CBC and type and screen ordered with  CBC revealing a hemoglobin of 5.5 down from 8.8 yesterday. ? ?Of note, patient experienced an episode of gastrointestinal bleeding 05/2017 and was hospitalized.  During that time an upper and lower endoscopy was performed by Dr. 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 ? ?For now, initiating Protonix 40 mg IV every 12hrs.  Holding patient's home regimen of aspirin, Pletal, subcutaneous heparin. ? ?Secure chat messages sent to Dr. Deno Etienne with [facility] gastroenterology who agreed to see the patient this morning in consultation.  Patient will be kept n.p.o. in the meantime.  2 unit packed red blood cell transfusion has been ordered to be performed this morning. ? ?Vernelle Emerald  MD ?[facility]  ? ? ? ? ? ? ? ? ? ? ?

## 2021-04-09 NOTE — Procedures (Addendum)
Patient Name: Julie Brewer  ?MRN: [id number]  ?Epilepsy Attending: Lora Havens  ?Referring Physician/Provider: Amie Portland, MD ?Duration:  04/08/2021 1628 to 04/09/2021 1628 ?  ?Patient history:  71 y.o. female with past medical history of ESRD (T/TH/S), CHF, DM, HTN, HLD, seizures, prior stroke, PVD, and blindness who presents to the [facility] Ed for evaluation of seizure this am. Home Lamictal dose was increased to '200mg'$  BID this past Friday due to increasing frequency of seizures despite compliance to medications.  Seizures usually happen around the time dialysis. EEG to evaluate for seizure ?  ?Level of alertness: Awake, asleep ?  ?AEDs during EEG study: None ?  ?Technical aspects: This EEG study was done with scalp electrodes positioned according to the 10-20 International system of electrode placement. Electrical activity was acquired at a sampling rate of '500Hz'$  and reviewed with a high frequency filter of '70Hz'$  and a low frequency filter of '1Hz'$ . EEG data were recorded continuously and digitally stored.  ?  ?Description: The posterior dominant rhythm consists of 8 Hz activity of moderate voltage (25-35 uV) seen predominantly in posterior head regions, symmetric and reactive to eye opening and eye closing. Sleep was characterized by vertex waves, sleep spindles (12 to 14 Hz), maximal frontocentral region. Hyperventilation and photic stimulation were not performed.    ?  ?IMPRESSION: ?This study is within normal limits. No seizures or epileptiform discharges were seen throughout the recording. ?  ?Lora Havens  ?

## 2021-04-09 NOTE — Progress Notes (Signed)
[facility] ?Progress Note ? ?Subjective: pt was dialyzed overnight early this morning. BP's dropped during HD and she had bloody stools. [facility] MD aware and ordered 2u prbc's for Hb drop into 5- 6 range this am.  Pt seen in room. Finished w/ transfusions. No seizures since admission here.  ? ?Vitals:  ? 04/09/21 0600 04/09/21 0615 04/09/21 0645 04/09/21 0745  ?BP: (!) 109/96 (!) 146/57 (!) 143/40 (!) 141/47  ?Pulse: (!) 111 (!) 108 (!) 110 (!) 112  ?Resp: _0 ?Temp:    99 ?F (37.2 ?C)  ?TempSrc:    Oral  ?SpO2: 99% 98% 96% 97%  ?Weight:  60.5 kg    ?Height:      ? ? ?Exam: ?Gen alert, no distress ?Sclera anicteric, throat clear  ?No jvd or bruits ?Chest clear bilat to bases ?RRR no RG ?Abd soft ntnd no mass or ascites +bs ?Ext no LE or UE edema ?Neuro is alert, Ox 3 , nf ?  LUA AVG+bruit ?  ?  ? Home meds include - norvasc 5, lipitor, asa, pletal, neurontin 100 bid, insulin lantus/ regular, lamcital, prilosec, prns/ vits / supps ?  ?  ?  ? OP HD: [facility] HD 3/09  ? 3h  400/1.5   59.6kg   2/2 bath   LUA AVG  Heparin 4500 ? - hect 10 ug IV tiw ? - mircera 30 mcg q4, last 3/02, due 3/16 ?  ?  ?Assessment/ Plan: ?Seizure episodes - per pmd and neurology. Is on continuous EEG monitoring.  ?GI bleed - w/ bloody stools this am and Hb down to 5- 6 range. 2u prbcs ordered per pmd.  ?ESRD - on HD TTS. Had HD overnight. Next HD on 3/14. ?Hypokalemia - better, 4.0 today.  ?Volume - no vol overload by exam, at dry wt ?HTN - hold norvasc w/ GIB ?DM2 - on insulin per pmd ?Anemia ckd - next ESA due on 3/16. Hb down w/ ABL.  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 Cont vdra.  ?  ?  ? ? ? ?Rob Doctor, hospital ?04/09/2021, 7:54 AM ? ? ?Recent Labs  ?Lab 04/08/21 ?0718 04/09/21 ?0543  ?HGB 8.7* 5.5*  ?ALBUMIN 2.8* 2.5*  ?CALCIUM 8.9 8.1*  ?PHOS 4.9*  --   ?CREATININE 4.68* 1.95*  ?K 3.8 4.0  ? ?Inpatient medications: ? sodium chloride   Intravenous Once  ? amLODipine  10 mg Oral Daily  ? atorvastatin  20 mg Oral Daily  ?  chlorhexidine gluconate (MEDLINE KIT)  15 mL Mouth Rinse BID  ? Chlorhexidine Gluconate Cloth  6 each Topical Daily  ? Chlorhexidine Gluconate Cloth  6 each Topical Q0600  ? docusate sodium  200 mg Oral Daily  ? [START ON 04/11/2021] doxercalciferol  10 mcg Intravenous Q T,Th,Sa-HD  ? feeding supplement (NEPRO CARB STEADY)  237 mL Oral BID BM  ? ferrous sulfate  650 mg Oral Daily  ? insulin aspart  0-5 Units Subcutaneous QHS  ? insulin aspart  0-6 Units Subcutaneous TID WC  ? insulin glargine-yfgn  7 Units Subcutaneous Daily  ? mouth rinse  15 mL Mouth Rinse Q4H  ? pantoprazole (PROTONIX) IV  40 mg Intravenous Q12H  ? ? ?acetaminophen, bisacodyl, hydrALAZINE, LORazepam ? ? ? ? ? ? ?

## 2021-04-09 NOTE — Anesthesia Preprocedure Evaluation (Addendum)
Anesthesia Evaluation  ?Patient identified by MRN, date of birth, ID band ?Patient awake ? ? ? ?Reviewed: ?Allergy & Precautions, NPO status , Patient's Chart, lab work & pertinent test results ? ?History of Anesthesia Complications ?(+) PONV and history of anesthetic complications ? ?Airway ?Mallampati: I ? ?TM Distance: >3 FB ?Neck ROM: Full ? ? ? Dental ? ?(+) Edentulous Upper, Edentulous Lower, Dental Advisory Given ?  ?Pulmonary ?asthma , Current Smoker and Patient abstained from smoking.,  ?  ?Pulmonary exam normal ? ? ? ? ? ? ? Cardiovascular ?hypertension, + Peripheral Vascular Disease and +CHF  ?Normal cardiovascular exam ? ?IMPRESSIONS  ? ? ??1. Left ventricular ejection fraction, by visual estimation, is 65 to  ?70%. The left ventricle has hyperdynamic function. There is mildly  ?increased left ventricular wall thickness  ??2. Left ventricular diastolic parameters are indeterminate  ??3. Global right ventricle has normal systolc function.The right  ?ventricular size is normal.  ??4. The mitral valve is normal in structure. No evidence of mitral valve  ?regurgitation.  ??5. The tricuspid valve was normal in structure. Tricuspid valve  ?regurgitation is trivial.  ??6. The aortic valve is tricuspid. Aortic valve regurgitation is not  ?visualized. Mild aortic valve sclerosis is present, with no evidence of  ?aortic valve stenosis.  ??7. The inferior vena cava is normal in size with greater than 50%  ?respiratory variability, suggesting right atrial pressure of 3 mmHg.  ??8. TR signal is inadequate for assessing pulmonary artery systolic  ?pressure.  ?  ?Neuro/Psych ?CVA   ? GI/Hepatic ?Neg liver ROS, GERD  ,  ?Endo/Other  ?diabetes ? Renal/GU ?ESRF and DialysisRenal disease  ? ?  ?Musculoskeletal ? ? Abdominal ?  ?Peds ? Hematology ?negative hematology ROS ?(+)   ?Anesthesia Other Findings ? ? Reproductive/Obstetrics ? ?  ? ? ? ? ? ? ? ? ? ? ? ? ? ?  ?  ? ? ? ? ? ? ? ?Lab  Results  ?Component Value Date  ? WBC 9.8 04/09/2021  ? HGB 5.5 (LL) 04/09/2021  ? HCT 16.8 (L) 04/09/2021  ? MCV 82.0 04/09/2021  ? PLT 184 04/09/2021  ? ?Lab Results  ?Component Value Date  ? CREATININE 1.95 (H) 04/09/2021  ? BUN 13 04/09/2021  ? NA 136 04/09/2021  ? K 4.0 04/09/2021  ? CL 102 04/09/2021  ? CO2 26 04/09/2021  ? ? ?Anesthesia Physical ? ?Anesthesia Plan ? ?ASA: 3 ? ?Anesthesia Plan: MAC  ? ?Post-op Pain Management: Minimal or no pain anticipated  ? ?Induction: Intravenous ? ?PONV Risk Score and Plan: 2 and Propofol infusion, Ondansetron and Treatment may vary due to age or medical condition ? ?Airway Management Planned: Natural Airway and Simple Face Mask ? ?Additional Equipment:  ? ?Intra-op Plan:  ? ?Post-operative Plan:  ? ?Informed Consent: I have reviewed the patients History and Physical, chart, labs and discussed the procedure including the risks, benefits and alternatives for the proposed anesthesia with the patient or authorized representative who has indicated his/her understanding and acceptance.  ? ? ? ?Dental advisory given ? ?Plan Discussed with: Anesthesiologist and CRNA ? ?Anesthesia Plan Comments:   ? ? ? ? ? ?Anesthesia Quick Evaluation ? ?

## 2021-04-09 NOTE — Progress Notes (Signed)
PROGRESS NOTE    Julie Brewer  KKX:[id number] DOB: December 16, 1950 DOA: 04/07/2021 PCP: Glenis Smoker, MD     Brief Narrative:  Patient is a 71 y.o.  female with history of ESRD on HD, seizure disorder, HFpEF, DM-2, HTN, legally blind who presented to the hospital with breakthrough seizures.  Apparently patient has had several medication adjustment in the outpatient setting but continued with increasing frequency of seizures.  Evaluated by neurology and was started on prolonged LTM EEG.    Significant events: 3/10>> admit to [facility] for breakthrough seizures   Significant studies: 3/10>> CT head: No acute intracranial abnormality.   Significant microbiology data: 3/10>> COVID/influenza PCR: Negative   New events last 24 hours / Subjective: Early this morning, patient had episode of large melanotic stool.  Repeat CBC showed hemoglobin down to 5.5.  GI was consulted and patient was ordered for 2 unit packed red blood cell transfusion.  This morning, patient states that she is feeling well overall, continuous EEG in process.  She states that she had an episode of vomiting last night but no nausea or vomiting this morning.  Assessment & Plan:   Principal Problem:   Seizure ([place])   Breakthrough seizure -Neurology following -Continue to hold Lamictal -Continue to monitor on LTM EEG  ESRD on HD TTS -Nephrology following  Acute blood loss anemia, melena -Transfused 2 unit packed red blood cell -GI consulted, planning for EGD tomorrow -Hold aspirin -Continue PPI  Chronic diastolic heart failure -Volume status remains stable, fluid management with dialysis  Hypertension -Holding Norvasc due to hypotension overnight   Hyperlipidemia -Continue statin  Diabetes mellitus, well controlled -A1c 6.1 -Continue Semglee, sliding scale insulin  DVT prophylaxis:  Place and maintain sequential compression device Start: 04/09/21 0630  Code Status: DNR Family Communication:  None at bedside Disposition Plan:  Status is: Inpatient Remains inpatient appropriate because: GI planning EGD, continuous EEG    Antimicrobials:  Anti-infectives (From admission, onward)    None        Objective: Vitals:   04/09/21 0745 04/09/21 0938 04/09/21 1012 04/09/21 1206  BP: (!) 141/47 (!) 138/52 (!) 143/56 (!) 161/58  Pulse: (!) 112 (!) 120 (!) 116 (!) 115  Resp: 20 (!) 24 15 20   Temp: 99 F (37.2 C) 98.7 F (37.1 C) 98.6 F (37 C) 98.3 F (36.8 C)  TempSrc: Oral Oral Oral Oral  SpO2: 97% 97% 95% 96%  Weight:      Height:        Intake/Output Summary (Last 24 hours) at 04/09/2021 1217 Last data filed at 04/09/2021 0615 Gross per 24 hour  Intake --  Output -658 ml  Net 658 ml   Filed Weights   04/07/21 2117 04/09/21 0145 04/09/21 0615  Weight: 60.9 kg 60 kg 60.5 kg    Examination:  General exam: Appears calm and comfortable  Respiratory system: Clear to auscultation. Respiratory effort normal. No respiratory distress. No conversational dyspnea.  Cardiovascular system: S1 & S2 heard, tachycardic. No murmurs. No pedal edema. Gastrointestinal system: Abdomen is nondistended, soft and nontender. Normal bowel sounds heard. Central nervous system: Alert and oriented. No focal neurological deficits. Speech clear.  Extremities: Symmetric in appearance  Skin: No rashes, lesions or ulcers on exposed skin  Psychiatry: Judgement and insight appear normal. Mood & affect appropriate.   Data Reviewed: I have personally reviewed following labs and imaging studies  CBC: Recent Labs  Lab 04/07/21 1040 04/08/21 0718 04/09/21 0543  WBC 8.5 7.2 9.8  NEUTROABS 6.5  --  7.4  HGB 10.3* 8.7* 5.5*  HCT 32.3* 26.9* 16.8*  MCV 85.2 83.8 82.0  PLT 235 213 631   Basic Metabolic Panel: Recent Labs  Lab 04/07/21 1040 04/08/21 0718 04/09/21 0543  NA 136 137 136  K 3.2* 3.8 4.0  CL 94* 103 102  CO2 30 24 26   GLUCOSE 200* 91 111*  BUN 11 17 13   CREATININE 4.19*  4.68* 1.95*  CALCIUM 9.5 8.9 8.1*  PHOS  --  4.9*  --    GFR: Estimated Creatinine Clearance: 22.8 mL/min (A) (by C-G formula based on SCr of 1.95 mg/dL (H)). Liver Function Tests: Recent Labs  Lab 04/07/21 1040 04/08/21 0718 04/09/21 0543  AST 14*  --  18  ALT 13  --  16  ALKPHOS 78  --  59  BILITOT 0.4  --  0.4  PROT 6.2*  --  4.7*  ALBUMIN 3.4* 2.8* 2.5*   No results for input(s): LIPASE, AMYLASE in the last 168 hours. No results for input(s): AMMONIA in the last 168 hours. Coagulation Profile: No results for input(s): INR, PROTIME in the last 168 hours. Cardiac Enzymes: No results for input(s): CKTOTAL, CKMB, CKMBINDEX, TROPONINI in the last 168 hours. BNP (last 3 results) No results for input(s): PROBNP in the last 8760 hours. HbA1C: Recent Labs    04/07/21 1811  HGBA1C 6.1*   CBG: Recent Labs  Lab 04/08/21 1230 04/08/21 1624 04/08/21 2029 04/09/21 0746 04/09/21 1207  GLUCAP 131* 168* 146* 113* 121*   Lipid Profile: No results for input(s): CHOL, HDL, LDLCALC, TRIG, CHOLHDL, LDLDIRECT in the last 72 hours. Thyroid Function Tests: No results for input(s): TSH, T4TOTAL, FREET4, T3FREE, THYROIDAB in the last 72 hours. Anemia Panel: No results for input(s): VITAMINB12, FOLATE, FERRITIN, TIBC, IRON, RETICCTPCT in the last 72 hours. Sepsis Labs: Recent Labs  Lab 04/07/21 1040 04/07/21 1227  LATICACIDVEN 2.7* 2.0*    Recent Results (from the past 240 hour(s))  Resp Panel by RT-PCR (Flu A&B, Covid) Nasopharyngeal Swab     Status: None   Collection Time: 04/07/21 11:06 AM   Specimen: Nasopharyngeal Swab; Nasopharyngeal(NP) swabs in vial transport medium  Result Value Ref Range Status   SARS Coronavirus 2 by RT PCR NEGATIVE NEGATIVE Final    Comment: (NOTE) SARS-CoV-2 target nucleic acids are NOT DETECTED.  The SARS-CoV-2 RNA is generally detectable in upper respiratory specimens during the acute phase of infection. The lowest concentration of SARS-CoV-2  viral copies this assay can detect is 138 copies/mL. A negative result does not preclude SARS-Cov-2 infection and should not be used as the sole basis for treatment or other patient management decisions. A negative result may occur with  improper specimen collection/handling, submission of specimen other than nasopharyngeal swab, presence of viral mutation(s) within the areas targeted by this assay, and inadequate number of viral copies(<138 copies/mL). A negative result must be combined with clinical observations, patient history, and epidemiological information. The expected result is Negative.  Fact Sheet for Patients:  EntrepreneurPulse.com.au  Fact Sheet for Healthcare Providers:  IncredibleEmployment.be  This test is no t yet approved or cleared by the [place] FDA and  has been authorized for detection and/or diagnosis of SARS-CoV-2 by FDA under an Emergency Use Authorization (EUA). This EUA will remain  in effect (meaning this test can be used) for the duration of the COVID-19 declaration under Section 564(b)(1) of the Act, 21 U.S.C.section 360bbb-3(b)(1), unless the authorization is terminated  or revoked sooner.  Influenza A by PCR NEGATIVE NEGATIVE Final   Influenza B by PCR NEGATIVE NEGATIVE Final    Comment: (NOTE) The Xpert Xpress SARS-CoV-2/FLU/RSV plus assay is intended as an aid in the diagnosis of influenza from Nasopharyngeal swab specimens and should not be used as a sole basis for treatment. Nasal washings and aspirates are unacceptable for Xpert Xpress SARS-CoV-2/FLU/RSV testing.  Fact Sheet for Patients: EntrepreneurPulse.com.au  Fact Sheet for Healthcare Providers: IncredibleEmployment.be  This test is not yet approved or cleared by the [place] FDA and has been authorized for detection and/or diagnosis of SARS-CoV-2 by FDA under an Emergency Use Authorization  (EUA). This EUA will remain in effect (meaning this test can be used) for the duration of the COVID-19 declaration under Section 564(b)(1) of the Act, 21 U.S.C. section 360bbb-3(b)(1), unless the authorization is terminated or revoked.  Performed at [facility], [place] [address] [place], [place] [address]       Radiology Studies: Overnight EEG with video  Result Date: 04/07/2021 Lora Havens, MD     04/09/2021 11:44 AM Patient Name: Julie Brewer MRN: [id number] Epilepsy Attending: Lora Havens Referring Physician/Provider: Amie Portland, MD Duration:  04/07/2021 1628 to 04/08/2021  1628 Patient history:  71 y.o. female with past medical history of ESRD (T/TH/S), CHF, DM, HTN, HLD, seizures, prior stroke, PVD, and blindness who presents to the [facility] for evaluation of seizure this am. Home Lamictal dose was increased to 263m BID this past Friday due to increasing frequency of seizures despite compliance to medications.  Seizures usually happen around the time dialysis. EEG to evaluate for seizure Level of alertness: Awake, asleep AEDs during EEG study: LTG Technical aspects: This EEG study was done with scalp electrodes positioned according to the 10-20 International system of electrode placement. Electrical activity was acquired at a sampling rate of 500Hz  and reviewed with a high frequency filter of 70Hz  and a low frequency filter of 1Hz . EEG data were recorded continuously and digitally stored. Description: The posterior dominant rhythm consists of 8 Hz activity of moderate voltage (25-35 uV) seen predominantly in posterior head regions, symmetric and reactive to eye opening and eye closing. Sleep was characterized by vertex waves, sleep spindles (12 to 14 Hz), maximal frontocentral region. Hyperventilation and photic stimulation were not performed.   IMPRESSION: This study is within normal limits. No seizures or epileptiform discharges were seen throughout the recording.  Priyanka OBarbra Sarks     Scheduled Meds:  atorvastatin  20 mg Oral Daily   chlorhexidine gluconate (MEDLINE KIT)  15 mL Mouth Rinse BID   Chlorhexidine Gluconate Cloth  6 each Topical Daily   Chlorhexidine Gluconate Cloth  6 each Topical Q0600   docusate sodium  200 mg Oral Daily   [START ON 04/11/2021] doxercalciferol  10 mcg Intravenous Q T,Th,Sa-HD   feeding supplement (NEPRO CARB STEADY)  237 mL Oral BID BM   ferrous sulfate  650 mg Oral Daily   insulin aspart  0-5 Units Subcutaneous QHS   insulin aspart  0-6 Units Subcutaneous TID WC   insulin glargine-yfgn  7 Units Subcutaneous Daily   mouth rinse  15 mL Mouth Rinse Q4H   pantoprazole (PROTONIX) IV  40 mg Intravenous Q12H   Continuous Infusions:   LOS: 1 day     JDessa Phi DO [facility] 04/09/2021, 12:17 PM   Available via Epic secure chat 7am-7pm After these hours, please refer to coverage provider listed on [email or web address]

## 2021-04-09 NOTE — Progress Notes (Signed)
Pt had a large black stool with red staining on the pad during dialysis.  CNA reported that last BM was brown/green with no black or red noted.  Pt reported she has had some bloody stools in the past, but they were unable to determine source of her bleeding.  Dr. Cyd Silence made aware and will order type and screen, CBC and CMP once dialysis is complete. ?Ayesha Mohair BSN RN CMSRN ?04/09/2021, 5:09 AM ? ?

## 2021-04-09 NOTE — Progress Notes (Signed)
?   04/09/21 0745  ?Assess: MEWS Score  ?Temp 99 ?F (37.2 ?C)  ?BP (!) 141/47  ?Pulse Rate (!) 112  ?ECG Heart Rate (!) 112  ?Resp 20  ?SpO2 97 %  ?Assess: MEWS Score  ?MEWS Temp 0  ?MEWS Systolic 0  ?MEWS Pulse 2  ?MEWS RR 0  ?MEWS LOC 0  ?MEWS Score 2  ?MEWS Score Color Yellow  ?Assess: if the MEWS score is Yellow or Red  ?Were vital signs taken at a resting state? Yes  ?Focused Assessment No change from prior assessment  ?Early Detection of Sepsis Score *See Row Information* Low  ?MEWS guidelines implemented *See Row Information* Yes  ?Treat  ?Pain Scale 0-10  ?Pain Score 0  ?Take Vital Signs  ?Increase Vital Sign Frequency  Yellow: Q 2hr X 2 then Q 4hr X 2, if remains yellow, continue Q 4hrs  ?Escalate  ?MEWS: Escalate Yellow: discuss with charge nurse/RN and consider discussing with provider and RRT  ?Notify: Charge Nurse/RN  ?Name of Charge Nurse/RN Notified Arsenio Katz RN  ?Date Charge Nurse/RN Notified 04/09/21  ?Time Charge Nurse/RN Notified [phone]  ? ? ?

## 2021-04-09 NOTE — Progress Notes (Signed)
Neurology Progress Note ? ? ?S:// ?Patient lying in bed in NAD. She is complaining of her abd being tender. No seizure activity per patient, still on LTM.Marland Kitchen No family at the bedside. Overnight events noted for multiple bloody stools with a Hgb drop to 5.5 from 8.7. No new neurological events noted overnight.  ? ?O:// ?Current vital signs: ?BP (!) 138/52 (BP Location: Right Arm)   Pulse (!) 120   Temp 98.7 ?F (37.1 ?C) (Oral)   Resp (!) 24   Ht 5' 3"  (1.6 m)   Wt 60.5 kg   SpO2 97%   BMI 23.63 kg/m?  ?Vital signs in last 24 hours: ?Temp:  [98 ?F (36.7 ?C)-99.3 ?F (37.4 ?C)] 98.7 ?F (37.1 ?C) (03/12 7672) ?Pulse Rate:  [98-120] 120 (03/12 0938) ?Resp:  [13-25] 24 (03/12 0947) ?BP: (63-146)/(40-96) 138/52 (03/12 0962) ?SpO2:  [90 %-99 %] 97 % (03/12 0938) ?Weight:  [60 kg-60.5 kg] 60.5 kg (03/12 0615) ? ?GENERAL: Awake, alert in NAD ?HEENT: - Normocephalic and atraumatic, dry mm ?LUNGS - Clear to auscultation bilaterally with no wheezes ?CV - S1S2 RRR, no m/r/g, equal pulses bilaterally. ?ABDOMEN - Soft, nontender, nondistended with normoactive BS ?Ext: warm, well perfused, intact peripheral pulses, no edema ?  ?NEURO:  ?Mental Status: AA&Ox3  ?Language: speech is clear  Naming, repetition, fluency, and comprehension intact. ?Cranial Nerves: legally Blind. no facial asymmetry, facial sensation intact, hearing intact, tongue/uvula/soft palate midline, normal sternocleidomastoid and trapezius muscle strength. No evidence of tongue atrophy or fibrillations ?Motor: Right lower 4/5, has pain in hip due to arthritis. Right upper, Left upper and left lower 5/5 ?Tone: is normal and bulk is normal ?Sensation- Intact to light touch bilaterally ?Coordination: FTN unable to assess, no ataxia in BLE. ?Gait- deferred ?Clinical exam unchanged from yesterday ? ?Medications ? ?Current Facility-Administered Medications:  ?  0.9 %  sodium chloride infusion (Manually program via Guardrails IV Fluids), , Intravenous, Once, Shalhoub,  Sherryll Burger, MD ?  acetaminophen (TYLENOL) tablet 650 mg, 650 mg, Oral, Q6H PRN, Wynetta Fines T, MD ?  atorvastatin (LIPITOR) tablet 20 mg, 20 mg, Oral, Daily, Wynetta Fines T, MD, 20 mg at 04/08/21 2206 ?  bisacodyl (DULCOLAX) EC tablet 5 mg, 5 mg, Oral, Daily PRN, Wynetta Fines T, MD ?  chlorhexidine gluconate (MEDLINE KIT) (PERIDEX) 0.12 % solution 15 mL, 15 mL, Mouth Rinse, BID, Wynetta Fines T, MD, 15 mL at 04/08/21 0814 ?  Chlorhexidine Gluconate Cloth 2 % PADS 6 each, 6 each, Topical, Daily, Lequita Halt, MD, 6 each at 04/08/21 [phone] ?  Chlorhexidine Gluconate Cloth 2 % PADS 6 each, 6 each, Topical, Q0600, Roney Jaffe, MD ?  docusate sodium (COLACE) capsule 200 mg, 200 mg, Oral, Daily, Wynetta Fines T, MD, 200 mg at 04/07/21 1637 ?  [START ON 04/11/2021] doxercalciferol (HECTOROL) injection 10 mcg, 10 mcg, Intravenous, Q T,Th,Sa-HD, Roney Jaffe, MD ?  feeding supplement (NEPRO CARB STEADY) liquid 237 mL, 237 mL, Oral, BID BM, Wynetta Fines T, MD ?  ferrous sulfate tablet 650 mg, 650 mg, Oral, Daily, Wynetta Fines T, MD, 650 mg at 04/08/21 2947 ?  hydrALAZINE (APRESOLINE) tablet 25 mg, 25 mg, Oral, Q6H PRN, Wynetta Fines T, MD, 25 mg at 04/07/21 1904 ?  insulin aspart (novoLOG) injection 0-5 Units, 0-5 Units, Subcutaneous, QHS, Zhang, Ping T, MD ?  insulin aspart (novoLOG) injection 0-6 Units, 0-6 Units, Subcutaneous, TID WC, Lequita Halt, MD, 1 Units at 04/08/21 1658 ?  insulin glargine-yfgn (SEMGLEE) injection 7 Units,  7 Units, Subcutaneous, Daily, Lequita Halt, MD, 7 Units at 04/08/21 [phone] ?  LORazepam (ATIVAN) injection 4 mg, 4 mg, Intravenous, Q5 Min x 2 PRN, Wynetta Fines T, MD ?  MEDLINE mouth rinse, 15 mL, Mouth Rinse, Q4H, Zhang, Ping T, MD ?  pantoprazole (PROTONIX) injection 40 mg, 40 mg, Intravenous, Q12H, Shalhoub, Sherryll Burger, MD ?Labs ?CBC ?   ?Component Value Date/Time  ? WBC 9.8 04/09/2021 0543  ? RBC 2.05 (L) 04/09/2021 0543  ? HGB 5.5 (LL) 04/09/2021 0543  ? HGB 9.3 (L) 07/17/2017 1012  ? HGB 10.1 (L)  01/17/2017 1043  ? HCT 16.8 (L) 04/09/2021 0543  ? HCT 31.8 (L) 01/17/2017 1043  ? PLT 184 04/09/2021 0543  ? PLT 239 07/17/2017 1012  ? PLT 227 01/17/2017 1043  ? MCV 82.0 04/09/2021 0543  ? MCV 82.0 01/17/2017 1043  ? MCH 26.8 04/09/2021 0543  ? MCHC 32.7 04/09/2021 0543  ? RDW 16.7 (H) 04/09/2021 0543  ? RDW 16.4 (H) 01/17/2017 1043  ? LYMPHSABS 1.6 04/09/2021 0543  ? LYMPHSABS 1.6 01/17/2017 1043  ? MONOABS 0.7 04/09/2021 0543  ? MONOABS 0.6 01/17/2017 1043  ? EOSABS 0.1 04/09/2021 0543  ? EOSABS 0.1 01/17/2017 1043  ? BASOSABS 0.0 04/09/2021 0543  ? BASOSABS 0.0 01/17/2017 1043  ? ? ?CMP  ?   ?Component Value Date/Time  ? NA 136 04/09/2021 0543  ? K 4.0 04/09/2021 0543  ? CL 102 04/09/2021 0543  ? CO2 26 04/09/2021 0543  ? GLUCOSE 111 (H) 04/09/2021 0543  ? BUN 13 04/09/2021 0543  ? CREATININE 1.95 (H) 04/09/2021 0543  ? CREATININE 1.39 (H) 07/17/2017 1012  ? CALCIUM 8.1 (L) 04/09/2021 0543  ? PROT 4.7 (L) 04/09/2021 0543  ? ALBUMIN 2.5 (L) 04/09/2021 0543  ? AST 18 04/09/2021 0543  ? ALT 16 04/09/2021 0543  ? ALKPHOS 59 04/09/2021 0543  ? BILITOT 0.4 04/09/2021 0543  ? GFRNONAA 28 (L) 04/09/2021 0543  ? GFRNONAA 39 (L) 07/17/2017 1012  ? GFRAA 23 (L) 06/25/2019 1853  ? GFRAA 45 (L) 07/17/2017 1012  ? ?cEEG 04-08-11 to 04-08-21 ?IMPRESSION: ?This study is within normal limits. No seizures or epileptiform discharges were seen throughout the recording. ? ?cEEG 3/11-3/12: ?This study is within normal limits. No seizures or epileptiform discharges were seen throughout the recording ? ?Imaging ?I have reviewed images in epic and the results pertinent to this consultation are: ?CT head with no acute changes ? ?Assessment:  ?71 year old woman past history of ESRD, CHF, diabetes, hypertension, hyperlipidemia, seizures, prior stroke, peripheral vascular disease and blindness presented to the ED for evaluation of breakthrough seizures. ?Outpatient medications Lamictal 200 BID ?Despite increasing medication, seizure  frequency is increased ?Sees Dr. Ellouise Newer at [facility] neurology who had plan to do inpatient video monitoring if continues to have breakthrough seizure.  With that plan, she was admitted for prolonged continuous EEG. ? ?Impression: ?Evaluate for breakthrough seizures ?Evaluate for underlying electrographic abnormalities and seizure characterization by continuous EEG ? ?Recommendations: ?- Continue to hold AEDs ?- cEEG for another day off of meds -want to capture a clinical spell for characterization.  ?- Lamotrigine level is pending  ?- Ok from neurology standpoint to have GI endoscopic procedures if needed ?- Neurology (Dr Hortense Ramal)  will continue to follow  ? ?Plan relayed to Dr. Maylene Roes ? ?Beulah Gandy DNP, ACNPC-AG ? ?Attending Neurohospitalist Addendum ?Patient seen and examined with APP/Resident. ?Agree with the history and physical as documented above. ?Agree with the plan as  documented, which I helped formulate. ?I have independently reviewed the chart, obtained history, review of systems and examined the patient.I have personally reviewed pertinent head/neck/spine imaging (CT/MRI). ?Please feel free to call with any questions. ? ?-- ?Amie Portland, MD ?Neurologist ?[facility] ?Pager: [phone] ? ? ? ? ?

## 2021-04-09 NOTE — Progress Notes (Addendum)
Discussed melena with Dr. Cyd Silence.  See new orders to d/c Heparin, ASA and Pletal.  Will change Protonix to IV and keep pt NPO for now.  He will ask GI to see. ?Ayesha Mohair BSN RN CMSRN ?04/09/2021, 6:18 AM ? ? ?Critical HGB of 5.5 called to Dr. Cyd Silence. ?Ayesha Mohair BSN RN CMSRN ?04/09/2021, 6:20 AM ? ?

## 2021-04-09 NOTE — Progress Notes (Signed)
Dialysis nurse at bedside for dialysis treatment. ?Ayesha Mohair BSN RN CMSRN ?04/09/2021, 1:57 AM ? ?

## 2021-04-10 ENCOUNTER — Encounter (HOSPITAL_COMMUNITY): Payer: Self-pay | Admitting: Internal Medicine

## 2021-04-10 ENCOUNTER — Encounter (HOSPITAL_COMMUNITY)
Admission: EM | Disposition: A | Discharge status: Home / Self Care | Payer: Self-pay | Source: Home / Self Care | Attending: Internal Medicine

## 2021-04-10 ENCOUNTER — Inpatient Hospital Stay (HOSPITAL_COMMUNITY): Payer: Medicare Other | Admitting: Anesthesiology

## 2021-04-10 DIAGNOSIS — K2971 Gastritis, unspecified, with bleeding: Secondary | ICD-10-CM

## 2021-04-10 DIAGNOSIS — K31811 Angiodysplasia of stomach and duodenum with bleeding: Secondary | ICD-10-CM

## 2021-04-10 DIAGNOSIS — E1151 Type 2 diabetes mellitus with diabetic peripheral angiopathy without gangrene: Secondary | ICD-10-CM

## 2021-04-10 DIAGNOSIS — N186 End stage renal disease: Secondary | ICD-10-CM | POA: Diagnosis not present

## 2021-04-10 DIAGNOSIS — I10 Essential (primary) hypertension: Secondary | ICD-10-CM | POA: Diagnosis not present

## 2021-04-10 DIAGNOSIS — K2091 Esophagitis, unspecified with bleeding: Secondary | ICD-10-CM

## 2021-04-10 LAB — TYPE AND SCREEN
ABO/RH(D): B POS
Antibody Screen: NEGATIVE
Unit division: 0
Unit division: 0

## 2021-04-10 LAB — BPAM RBC
Blood Product Expiration Date: 202304062359
Blood Product Expiration Date: 202304072359
ISSUE DATE / TIME: 202303120952
ISSUE DATE / TIME: 202303121654
Unit Type and Rh: 7300
Unit Type and Rh: 7300

## 2021-04-10 LAB — BASIC METABOLIC PANEL
Anion gap: 9 (ref 5–15)
BUN: 29 mg/dL — ABNORMAL HIGH (ref 8–23)
CO2: 24 mmol/L (ref 22–32)
Calcium: 9.1 mg/dL (ref 8.9–10.3)
Chloride: 108 mmol/L (ref 98–111)
Creatinine, Ser: 3.97 mg/dL — ABNORMAL HIGH (ref 0.44–1.00)
GFR, Estimated: 12 mL/min — ABNORMAL LOW (ref >60)
Glucose, Bld: 109 mg/dL — ABNORMAL HIGH (ref 70–99)
Potassium: 4.4 mmol/L (ref 3.5–5.1)
Sodium: 141 mmol/L (ref 135–145)

## 2021-04-10 LAB — CBC
HCT: 25.8 % — ABNORMAL LOW (ref 36.0–46.0)
Hemoglobin: 8.8 g/dL — ABNORMAL LOW (ref 12.0–15.0)
MCH: 29.5 pg (ref 26.0–34.0)
MCHC: 34.1 g/dL (ref 30.0–36.0)
MCV: 86.6 fL (ref 80.0–100.0)
Platelets: 163 10*3/uL (ref 150–400)
RBC: 2.98 MIL/uL — ABNORMAL LOW (ref 3.87–5.11)
RDW: 16.1 % — ABNORMAL HIGH (ref 11.5–15.5)
WBC: 10.3 10*3/uL (ref 4.0–10.5)
nRBC: 0 % (ref 0.0–0.2)

## 2021-04-10 LAB — GLUCOSE, CAPILLARY
Glucose-Capillary: 107 mg/dL — ABNORMAL HIGH (ref 70–99)
Glucose-Capillary: 103 mg/dL — ABNORMAL HIGH (ref 70–99)
Glucose-Capillary: 152 mg/dL — ABNORMAL HIGH (ref 70–99)
Glucose-Capillary: 126 mg/dL — ABNORMAL HIGH (ref 70–99)
Glucose-Capillary: 151 mg/dL — ABNORMAL HIGH (ref 70–99)

## 2021-04-10 LAB — LAMOTRIGINE LEVEL: Lamotrigine Lvl: 13.2 ug/mL (ref 2.0–20.0)

## 2021-04-10 LAB — HEPATITIS B SURFACE ANTIBODY, QUANTITATIVE: Hep B S AB Quant (Post): 69 m[IU]/mL (ref >9.9)

## 2021-04-10 SURGERY — ESOPHAGOGASTRODUODENOSCOPY (EGD) WITH PROPOFOL
Anesthesia: Monitor Anesthesia Care

## 2021-04-10 MED ORDER — PROPOFOL 500 MG/50ML IV EMUL
INTRAVENOUS | Status: DC | PRN
  Administered 2021-04-10: 125 ug/kg/min via INTRAVENOUS

## 2021-04-10 MED ORDER — AMLODIPINE BESYLATE 5 MG PO TABS
5.0000 mg | ORAL_TABLET | Freq: Every day | ORAL | Status: DC
Start: 2021-04-10 — End: 2021-04-11
  Administered 2021-04-10: 5 mg via ORAL
  Filled 2021-04-10 (×2): qty 1

## 2021-04-10 MED ORDER — LIDOCAINE 2% (20 MG/ML) 5 ML SYRINGE
INTRAMUSCULAR | Status: DC | PRN
  Administered 2021-04-10: 60 mg via INTRAVENOUS

## 2021-04-10 MED ORDER — SODIUM CHLORIDE 0.9 % IV SOLN: INTRAVENOUS | Status: DC

## 2021-04-10 MED ORDER — PROPOFOL 10 MG/ML IV BOLUS
INTRAVENOUS | Status: DC | PRN
  Administered 2021-04-10: 30 mg via INTRAVENOUS

## 2021-04-10 SURGICAL SUPPLY — 15 items

## 2021-04-10 NOTE — Progress Notes (Signed)
PROGRESS NOTE    Julie Brewer  TGG:[id number] DOB: [birth year] DOA: 04/07/2021 PCP: Glenis Smoker, MD     Brief Narrative:  Patient is a 71 y.o.  female with history of ESRD on HD, seizure disorder, HFpEF, DM-2, HTN, legally blind who presented to the hospital with breakthrough seizures.  Apparently patient has had several medication adjustment in the outpatient setting but continued with increasing frequency of seizures.  Evaluated by neurology and was started on prolonged LTM EEG.    Significant events: 3/10>> admit to [facility] for breakthrough seizures 3/12>> melena, 2u prbc transfused. GI consulted    Significant studies: 3/10>> CT head: No acute intracranial abnormality 3/10>> Continuous EEG started  3/13>> EGD showed mild LA grade a esophagitis at GE junction, hemorrhagic mucosa in the gastric cardia treated with APC, gastritis and small nonbleeding ulcer in the second portion of the duodenum.   Significant microbiology data: 3/10>> COVID/influenza PCR: Negative   New events last 24 hours / Subjective: Underwent EGD this morning. Patient feeling well overall, no further melena. Wants to go home tomorrow.   Assessment & Plan:   Principal Problem:   Seizure ([place])   Breakthrough seizure -Neurology following -Continue to hold Lamictal -Continue to monitor on LTM EEG  ESRD on HD TTS -Nephrology following  Acute blood loss anemia, melena -S/p 2 unit packed red blood cell -GI consulted, EGD as above  -Hold aspirin -Continue PPI -Monitor Hgb   Chronic diastolic heart failure -Volume status remains stable, fluid management with dialysis  Hypertension -Norvasc   Hyperlipidemia -Lipitor   Diabetes mellitus, well controlled -A1c 6.1 -Continue Semglee, sliding scale insulin  DVT prophylaxis:  Place and maintain sequential compression device Start: 04/09/21 0630  Code Status: DNR Family Communication: None at bedside Disposition Plan:  Status is:  Inpatient Remains inpatient appropriate because: continuous EEG    Antimicrobials:  Anti-infectives (From admission, onward)    None        Objective: Vitals:   04/10/21 0721 04/10/21 0840 04/10/21 0855 04/10/21 0929  BP: (!) 195/66 (!) 156/65 (!) 163/65 (!) 178/70  Pulse: 93 88 89 89  Resp: 20 20 15 19   Temp: 98.7 F (37.1 C) 97.6 F (36.4 C) 97.6 F (36.4 C)   TempSrc: Oral     SpO2: 96% 98% 97% 93%  Weight: 60.5 kg     Height: 5' 3"  (1.6 m)       Intake/Output Summary (Last 24 hours) at 04/10/2021 1152 Last data filed at 04/10/2021 0833 Gross per 24 hour  Intake 1053 ml  Output --  Net 1053 ml    Filed Weights   04/09/21 0145 04/09/21 0615 04/10/21 0721  Weight: 60 kg 60.5 kg 60.5 kg    Examination:  General exam: Appears calm and comfortable  Respiratory system: Clear to auscultation. Respiratory effort normal. No respiratory distress. No conversational dyspnea.  Cardiovascular system: S1 & S2 heard, RR. No murmurs. No pedal edema. Gastrointestinal system: Abdomen is nondistended, soft and nontender. Normal bowel sounds heard. Central nervous system: Alert and oriented. No focal neurological deficits. Speech clear.  Extremities: Symmetric in appearance  Skin: No rashes, lesions or ulcers on exposed skin  Psychiatry: Judgement and insight appear normal. Mood & affect appropriate.   Data Reviewed: I have personally reviewed following labs and imaging studies  CBC: Recent Labs  Lab 04/07/21 1040 04/08/21 0718 04/09/21 0543 04/10/21 0139  WBC 8.5 7.2 9.8 10.3  NEUTROABS 6.5  --  7.4  --   HGB  10.3* 8.7* 5.5* 8.8*  HCT 32.3* 26.9* 16.8* 25.8*  MCV 85.2 83.8 82.0 86.6  PLT 235 213 184 409    Basic Metabolic Panel: Recent Labs  Lab 04/07/21 1040 04/08/21 0718 04/09/21 0543 04/10/21 0139  NA 136 137 136 141  K 3.2* 3.8 4.0 4.4  CL 94* 103 102 108  CO2 30 24 26 24   GLUCOSE 200* 91 111* 109*  BUN 11 17 13  29*  CREATININE 4.19* 4.68* 1.95*  3.97*  CALCIUM 9.5 8.9 8.1* 9.1  PHOS  --  4.9*  --   --     GFR: Estimated Creatinine Clearance: 11.2 mL/min (A) (by C-G formula based on SCr of 3.97 mg/dL (H)). Liver Function Tests: Recent Labs  Lab 04/07/21 1040 04/08/21 0718 04/09/21 0543  AST 14*  --  18  ALT 13  --  16  ALKPHOS 78  --  59  BILITOT 0.4  --  0.4  PROT 6.2*  --  4.7*  ALBUMIN 3.4* 2.8* 2.5*    No results for input(s): LIPASE, AMYLASE in the last 168 hours. No results for input(s): AMMONIA in the last 168 hours. Coagulation Profile: No results for input(s): INR, PROTIME in the last 168 hours. Cardiac Enzymes: No results for input(s): CKTOTAL, CKMB, CKMBINDEX, TROPONINI in the last 168 hours. BNP (last 3 results) No results for input(s): PROBNP in the last 8760 hours. HbA1C: Recent Labs    04/07/21 1811  HGBA1C 6.1*    CBG: Recent Labs  Lab 04/09/21 1207 04/09/21 1616 04/09/21 1957 04/10/21 0731 04/10/21 0844  GLUCAP 121* 115* 117* 107* 103*    Lipid Profile: No results for input(s): CHOL, HDL, LDLCALC, TRIG, CHOLHDL, LDLDIRECT in the last 72 hours. Thyroid Function Tests: No results for input(s): TSH, T4TOTAL, FREET4, T3FREE, THYROIDAB in the last 72 hours. Anemia Panel: No results for input(s): VITAMINB12, FOLATE, FERRITIN, TIBC, IRON, RETICCTPCT in the last 72 hours. Sepsis Labs: Recent Labs  Lab 04/07/21 1040 04/07/21 1227  LATICACIDVEN 2.7* 2.0*     Recent Results (from the past 240 hour(s))  Resp Panel by RT-PCR (Flu A&B, Covid) Nasopharyngeal Swab     Status: None   Collection Time: 04/07/21 11:06 AM   Specimen: Nasopharyngeal Swab; Nasopharyngeal(NP) swabs in vial transport medium  Result Value Ref Range Status   SARS Coronavirus 2 by RT PCR NEGATIVE NEGATIVE Final    Comment: (NOTE) SARS-CoV-2 target nucleic acids are NOT DETECTED.  The SARS-CoV-2 RNA is generally detectable in upper respiratory specimens during the acute phase of infection. The lowest concentration  of SARS-CoV-2 viral copies this assay can detect is 138 copies/mL. A negative result does not preclude SARS-Cov-2 infection and should not be used as the sole basis for treatment or other patient management decisions. A negative result may occur with  improper specimen collection/handling, submission of specimen other than nasopharyngeal swab, presence of viral mutation(s) within the areas targeted by this assay, and inadequate number of viral copies(<138 copies/mL). A negative result must be combined with clinical observations, patient history, and epidemiological information. The expected result is Negative.  Fact Sheet for Patients:  EntrepreneurPulse.com.au  Fact Sheet for Healthcare Providers:  IncredibleEmployment.be  This test is no t yet approved or cleared by the [place] FDA and  has been authorized for detection and/or diagnosis of SARS-CoV-2 by FDA under an Emergency Use Authorization (EUA). This EUA will remain  in effect (meaning this test can be used) for the duration of the COVID-19 declaration under Section 564(b)(1)  of the Act, 21 U.S.C.section 360bbb-3(b)(1), unless the authorization is terminated  or revoked sooner.       Influenza A by PCR NEGATIVE NEGATIVE Final   Influenza B by PCR NEGATIVE NEGATIVE Final    Comment: (NOTE) The Xpert Xpress SARS-CoV-2/FLU/RSV plus assay is intended as an aid in the diagnosis of influenza from Nasopharyngeal swab specimens and should not be used as a sole basis for treatment. Nasal washings and aspirates are unacceptable for Xpert Xpress SARS-CoV-2/FLU/RSV testing.  Fact Sheet for Patients: EntrepreneurPulse.com.au  Fact Sheet for Healthcare Providers: IncredibleEmployment.be  This test is not yet approved or cleared by the [place] FDA and has been authorized for detection and/or diagnosis of SARS-CoV-2 by FDA under an Emergency Use  Authorization (EUA). This EUA will remain in effect (meaning this test can be used) for the duration of the COVID-19 declaration under Section 564(b)(1) of the Act, 21 U.S.C. section 360bbb-3(b)(1), unless the authorization is terminated or revoked.  Performed at [facility], [place] [address], [place], [place] [address]        Radiology Studies: No results found.    Scheduled Meds:  atorvastatin  20 mg Oral Daily   chlorhexidine gluconate (MEDLINE KIT)  15 mL Mouth Rinse BID   Chlorhexidine Gluconate Cloth  6 each Topical Daily   Chlorhexidine Gluconate Cloth  6 each Topical Q0600   docusate sodium  200 mg Oral Daily   [START ON 04/11/2021] doxercalciferol  10 mcg Intravenous Q T,Th,Sa-HD   feeding supplement (NEPRO CARB STEADY)  237 mL Oral BID BM   ferrous sulfate  650 mg Oral Daily   insulin aspart  0-5 Units Subcutaneous QHS   insulin aspart  0-6 Units Subcutaneous TID WC   insulin glargine-yfgn  7 Units Subcutaneous Daily   mouth rinse  15 mL Mouth Rinse Q4H   pantoprazole (PROTONIX) IV  40 mg Intravenous Q12H   Continuous Infusions:   LOS: 2 days     Dessa Phi, DO [facility] 04/10/2021, 11:52 AM   Available via Epic secure chat 7am-7pm After these hours, please refer to coverage provider listed on [email or web address]

## 2021-04-10 NOTE — Op Note (Signed)
[facility] ?Patient Name: Julie Brewer ?Procedure Date : 04/10/2021 ?MRN: [id number] ?Attending MD: Otis Brace , MD ?Date of Birth: March 27, 1950 ?CSN: [id number] ?Age: 71 ?Admit Type: Inpatient ?Procedure:                Upper GI endoscopy ?Indications:              Melena ?Providers:                Otis Brace, MD, Jaci Carrel, RN, Elberta Fortis  ?                          Johnella Moloney, Technician ?Referring MD:              ?Medicines:                Sedation Administered by an Anesthesia Professional ?Complications:            No immediate complications. ?Estimated Blood Loss:     Estimated blood loss was minimal. ?Procedure:                Pre-Anesthesia Assessment: ?                          - Prior to the procedure, a History and Physical  ?                          was performed, and patient medications and  ?                          allergies were reviewed. The patient's tolerance of  ?                          previous anesthesia was also reviewed. The risks  ?                          and benefits of the procedure and the sedation  ?                          options and risks were discussed with the patient.  ?                          All questions were answered, and informed consent  ?                          was obtained. Prior Anticoagulants: The patient has  ?                          taken no previous anticoagulant or antiplatelet  ?                          agents. ASA Grade Assessment: III - A patient with  ?                          severe systemic disease. After reviewing the risks  ?  and benefits, the patient was deemed in  ?                          satisfactory condition to undergo the procedure. ?                          After obtaining informed consent, the endoscope was  ?                          passed under direct vision. Throughout the  ?                          procedure, the patient's blood pressure, pulse, and  ?                           oxygen saturations were monitored continuously. The  ?                          [id number]-[id number] ([id number]) Olympus endoscope was introduced  ?                          through the mouth, and advanced to the second part  ?                          of duodenum. The upper GI endoscopy was  ?                          accomplished without difficulty. The patient  ?                          tolerated the procedure well. ?Scope In: ?Scope Out: ?Findings: ?     LA Grade A (one or more mucosal breaks less than 5 mm, not extending  ?     between tops of 2 mucosal folds) esophagitis was found at the  ?     gastroesophageal junction. ?     Localized hemorrhagic mucosa with bleeding was found in the cardia.  ?     Fulguration to ablate the lesion to prevent bleeding by argon plasma was  ?     successful. ?     Scattered moderate inflammation characterized by congestion (edema),  ?     erosions and erythema was found in the entire examined stomach. Biopsies  ?     were taken with a cold forceps for histology. ?     The duodenal bulb and first portion of the duodenum were normal. ?     One non-bleeding superficial duodenal ulcer with no stigmata of bleeding  ?     was found in the second portion of the duodenum. The lesion was 5 mm in  ?     largest dimension. ?Impression:               - LA Grade A esophagitis. ?                          - Hemorrhagic gastropathy. Treated with argon  ?  plasma coagulation (APC). ?                          - Gastritis. Biopsied. ?                          - Normal duodenal bulb and first portion of the  ?                          duodenum. ?                          - Non-bleeding duodenal ulcer with no stigmata of  ?                          bleeding. ?Recommendation:           - Return patient to hospital ward for ongoing care. ?                          - Resume previous diet. ?                          - Continue present medications. ?                          - Await  pathology results. ?Procedure Code(s):        --- Professional --- ?                          99371, 59, Esophagogastroduodenoscopy, flexible,  ?                          transoral; with control of bleeding, any method ?                          69678, Esophagogastroduodenoscopy, flexible,  ?                          transoral; with biopsy, single or multiple ?Diagnosis Code(s):        --- Professional --- ?                          K20.90, Esophagitis, unspecified without bleeding ?                          K92.2, Gastrointestinal hemorrhage, unspecified ?                          K29.70, Gastritis, unspecified, without bleeding ?                          K92.1, Melena (includes Hematochezia) ?CPT copyright 2019 American Medical Association. All rights reserved. ?The codes documented in this report are preliminary and upon coder review may  ?be revised to meet current compliance requirements. ?Otis Brace, MD ?Otis Brace, MD ?04/10/2021 8:41:34 AM ?Number of Addenda: 0 ?

## 2021-04-10 NOTE — Progress Notes (Addendum)
Subjective: No events overnight.  Plan for HD tomorrow. ? ?ROS: negative except above ? ?Examination ? ?Vital signs in last 24 hours: ?Temp:  [97.6 ?F (36.4 ?C)-99 ?F (37.2 ?C)] 97.6 ?F (36.4 ?C) (03/13 5631) ?Pulse Rate:  [88-115] 89 (03/13 0929) ?Resp:  [14-20] 19 (03/13 0929) ?BP: (140-195)/(55-76) 178/70 (03/13 0929) ?SpO2:  [93 %-98 %] 93 % (03/13 0929) ?Weight:  [60.5 kg] 60.5 kg (03/13 0721) ? ?General: lying in bed, NAD ?CVS: pulse-normal rate and rhythm ?RS: breathing comfortably ?Extremities: normal  ?Neuro: AOx3, blind, rest of cranial nerve grossly intact, 5/5 in all extremities except 4/5 in RLE ? ?Basic Metabolic Panel: ?Recent Labs  ?Lab 04/07/21 ?1040 04/08/21 ?0718 04/09/21 ?0543 04/10/21 ?0139  ?NA 136 137 136 141  ?K 3.2* 3.8 4.0 4.4  ?CL 94* 103 102 108  ?CO2 '30 24 26 24  '$ ?GLUCOSE 200* 91 111* 109*  ?BUN '11 17 13 '$ 29*  ?CREATININE 4.19* 4.68* 1.95* 3.97*  ?CALCIUM 9.5 8.9 8.1* 9.1  ?PHOS  --  4.9*  --   --   ? ? ?CBC: ?Recent Labs  ?Lab 04/07/21 ?1040 04/08/21 ?0718 04/09/21 ?0543 04/10/21 ?0139  ?WBC 8.5 7.2 9.8 10.3  ?NEUTROABS 6.5  --  7.4  --   ?HGB 10.3* 8.7* 5.5* 8.8*  ?HCT 32.3* 26.9* 16.8* 25.8*  ?MCV 85.2 83.8 82.0 86.6  ?PLT 235 213 184 163  ? ? ? ?Coagulation Studies: ?No results for input(s): LABPROT, INR in the last 72 hours. ? ?Imaging ? ?CT head without contrast 04/07/2021: No acute intracranial abnormality. ? Stable mild atrophy and moderate chronic microvascular ischemic ?changes. ? ?ASSESSMENT AND PLAN: 71 year old female with history of epilepsy on lamotrigine who presented with generalized tonic-clonic seizure-like activity with worsening frequency despite increasing lamotrigine. ? ?Epilepsy with breakthrough seizure ?-No ictal-interictal activity overnight. ? ?Recommendations ?-Continue video EEG for characterization of spells ?- We will continue to hold lamotrigine for seizure provocation ?-Continue seizure precautions ?- PRN IV Ativan 2 mg for seizure-like  activity ?-Management of rest of comorbidities per primary team ? ?I have spent a total of  25 minutes with the patient reviewing hospital notes,  test results, labs and examining the patient as well as establishing an assessment and plan that was discussed personally with the patient and daughter at bedside.  > 50% of time was spent in direct patient care. ?  ? ?Zeb Comfort ?Epilepsy ?[facility] ?For questions after 5pm please refer to AMION to reach the Neurologist on call ? ?

## 2021-04-10 NOTE — Interval H&P Note (Signed)
History and Physical Interval Note: ? ?04/10/2021 ?8:13 AM ? ?Julie Brewer  has presented today for surgery, with the diagnosis of melena,anemia.  The various methods of treatment have been discussed with the patient and family. After consideration of risks, benefits and other options for treatment, the patient has consented to  Procedure(s): ?ESOPHAGOGASTRODUODENOSCOPY (EGD) WITH PROPOFOL (N/A) as a surgical intervention.  The patient's history has been reviewed, patient examined, no change in status, stable for surgery.  I have reviewed the patient's chart and labs.  Questions were answered to the patient's satisfaction.   ? ? ?Julie Brewer ? ? ?

## 2021-04-10 NOTE — Progress Notes (Signed)
[facility] Progress Note    OP HD: [facility] last HD 3/09   3h  400/1.5   59.6kg   2/2 bath   LUA AVG  Heparin 4500  - hect 10 ug IV tiw  - mircera 30 mcg q4, last 3/02, due 3/16     Assessment/ Plan: Seizure episodes - per pmd and neurology. Is on continuous EEG monitoring.  GI bleed - bloody stools and Hb down to 5- 6 range -> 2u prbcs. EGD gastric AVM APC, small nonbleeding ulcer in 2nd portion of duodenum.  ESRD - on HD TTS.  Next HD on 3/14 -> will try to get her on for 1st shift. Hypokalemia - better, 4.0 3/12.  Volume - no vol overload by exam, at dry wt HTN - hold norvasc w/ GIB DM2 - on insulin per pmd Anemia ckd - next ESA due on 3/16. Hb down w/ ABL.  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 Cont vdra.       Subjective: Pt seen in room; feeling better. No seizures since admission here; EEG ongoing.   Vitals:   04/10/21 0721 04/10/21 0840 04/10/21 0855 04/10/21 0929  BP: (!) 195/66 (!) 156/65 (!) 163/65 (!) 178/70  Pulse: 93 88 89 89  Resp: _0 Temp: 98.7 F (37.1 C) 97.6 F (36.4 C) 97.6 F (36.4 C)   TempSrc: Oral     SpO2: 96% 98% 97% 93%  Weight: 60.5 kg     Height: _1  (1.6 m)       Exam: Gen alert, no distress Sclera anicteric, throat clear  No jvd or bruits Chest clear bilat to bases RRR no RG Abd soft ntnd no mass or ascites +bs Ext no LE or UE edema Neuro is alert, Ox 3 , nf   LUA AVG+bruit      Home meds include - norvasc 5, lipitor, asa, pletal, neurontin 100 bid, insulin lantus/ regular, lamcital, prilosec, prns/ vits / supps    Rob [place] 04/10/2021, 10:48 AM   Recent Labs  Lab 04/08/21 0718 04/09/21 0543 04/10/21 0139  HGB 8.7* 5.5* 8.8*  ALBUMIN 2.8* 2.5*  --   CALCIUM 8.9 8.1* 9.1  PHOS 4.9*  --   --   CREATININE 4.68* 1.95* 3.97*  K 3.8 4.0 4.4   Inpatient medications:  atorvastatin  20 mg Oral Daily   chlorhexidine gluconate (MEDLINE KIT)  15 mL Mouth Rinse BID   Chlorhexidine Gluconate Cloth  6  each Topical Daily   Chlorhexidine Gluconate Cloth  6 each Topical Q0600   docusate sodium  200 mg Oral Daily   [START ON 04/11/2021] doxercalciferol  10 mcg Intravenous Q T,Th,Sa-HD   feeding supplement (NEPRO CARB STEADY)  237 mL Oral BID BM   ferrous sulfate  650 mg Oral Daily   insulin aspart  0-5 Units Subcutaneous QHS   insulin aspart  0-6 Units Subcutaneous TID WC   insulin glargine-yfgn  7 Units Subcutaneous Daily   mouth rinse  15 mL Mouth Rinse Q4H   pantoprazole (PROTONIX) IV  40 mg Intravenous Q12H    acetaminophen, bisacodyl, hydrALAZINE, LORazepam

## 2021-04-10 NOTE — Procedures (Signed)
Patient Name: ZYIONNA PESCE  MRN: [id number]  Epilepsy Attending: Lora Havens  Referring Physician/Provider: Amie Portland, MD Duration:  04/09/2021 1628 to 04/10/2021 1020   Patient history:  72 y.o. female with past medical history of ESRD (T/TH/S), CHF, DM, HTN, HLD, seizures, prior stroke, PVD, and blindness who presents to the [facility] for evaluation of seizure this am. Home Lamictal dose was increased to '200mg'$  BID this past Friday due to increasing frequency of seizures despite compliance to medications.  Seizures usually happen around the time dialysis. EEG to evaluate for seizure   Level of alertness: Awake, asleep   AEDs during EEG study: LTG   Technical aspects: This EEG study was done with scalp electrodes positioned according to the 10-20 International system of electrode placement. Electrical activity was acquired at a sampling rate of '500Hz'$  and reviewed with a high frequency filter of '70Hz'$  and a low frequency filter of '1Hz'$ . EEG data were recorded continuously and digitally stored.    Description: The posterior dominant rhythm consists of 8 Hz activity of moderate voltage (25-35 uV) seen predominantly in posterior head regions, symmetric and reactive to eye opening and eye closing. Sleep was characterized by vertex waves, sleep spindles (12 to 14 Hz), maximal frontocentral region. Hyperventilation and photic stimulation were not performed.      IMPRESSION: This study is within normal limits. No seizures or epileptiform discharges were seen throughout the recording.   Khyrie Masi Barbra Sarks

## 2021-04-10 NOTE — Progress Notes (Signed)
?  Transition of Care (TOC) Screening Note ? ? ?Patient Details  ?Name: Julie Brewer ?Date of Birth: 1950-03-22 ? ? ?Transition of Care (TOC) CM/SW Contact:    ?Cyndi Bender, RN ?Phone Number: ?04/10/2021, 9:29 AM ? ? ? ?Transition of Care Department [facility]) has reviewed patient. We will continue to monitor patient advancement through interdisciplinary progression rounds.  ?

## 2021-04-10 NOTE — Anesthesia Postprocedure Evaluation (Addendum)
Anesthesia Post Note ? ?Patient: Julie Brewer ? ?Procedure(s) Performed: ESOPHAGOGASTRODUODENOSCOPY (EGD) WITH PROPOFOL ?BIOPSY ?HOT HEMOSTASIS (ARGON PLASMA COAGULATION/BICAP) ? ?  ? ?Patient location during evaluation: Endoscopy ?Anesthesia Type: MAC ?Level of consciousness: awake and alert ?Pain management: pain level controlled ?Vital Signs Assessment: post-procedure vital signs reviewed and stable ?Respiratory status: spontaneous breathing and respiratory function stable ?Cardiovascular status: stable ?Postop Assessment: no apparent nausea or vomiting ?Anesthetic complications: no ? ? ?No notable events documented. ? ?Last Vitals:  ?Vitals:  ? 04/10/21 1255 04/10/21 1626  ?BP: 139/61 (!) 162/64  ?Pulse: 92 85  ?Resp: 13 20  ?Temp: 36.8 ?C 36.7 ?C  ?SpO2: 90% 97%  ?  ?Last Pain:  ?Vitals:  ? 04/10/21 1626  ?TempSrc: Oral  ?PainSc:   ? ? ?  ?  ?  ?  ?  ?  ? ?Corona Popovich DANIEL ? ? ? ? ?

## 2021-04-10 NOTE — Anesthesia Procedure Notes (Signed)
Procedure Name: [place] ?Date/Time: 04/10/2021 8:15 AM ?Performed by: Amadeo Garnet, CRNA ?Pre-anesthesia Checklist: Patient identified and Emergency Drugs available ?Patient Re-evaluated:Patient Re-evaluated prior to induction ?Oxygen Delivery Method: Nasal cannula ?Preoxygenation: Pre-oxygenation with 100% oxygen ?Induction Type: IV induction ?Placement Confirmation: positive ETCO2 ?Dental Injury: Teeth and Oropharynx as per pre-operative assessment  ? ? ? ? ?

## 2021-04-10 NOTE — Transfer of Care (Signed)
Immediate Anesthesia Transfer of Care Note ? ?Patient: Camrin Lapre Ayers-Farrar ? ?Procedure(s) Performed: ESOPHAGOGASTRODUODENOSCOPY (EGD) WITH PROPOFOL ?BIOPSY ?HOT HEMOSTASIS (ARGON PLASMA COAGULATION/BICAP) ? ?Patient Location: PACU ? ?Anesthesia Type:MAC ? ?Level of Consciousness: awake, alert  and oriented ? ?Airway & Oxygen Therapy: Patient Spontanous Breathing and Patient connected to face mask oxygen ? ?Post-op Assessment: Report given to RN, Post -op Vital signs reviewed and stable and Patient moving all extremities ? ?Post vital signs: Reviewed and stable ? ?Last Vitals:  ?Vitals Value Taken Time  ?BP 96/66 04/10/21 0841  ?Temp    ?Pulse 91 04/10/21 0843  ?Resp 21 04/10/21 0843  ?SpO2 96 % 04/10/21 0843  ?Vitals shown include unvalidated device data. ? ?Last Pain:  ?Vitals:  ? 04/10/21 0721  ?TempSrc: Oral  ?PainSc: 0-No pain  ?   ? ?  ? ?Complications: No notable events documented. ?

## 2021-04-10 NOTE — Brief Op Note (Signed)
04/07/2021 - 04/10/2021 ? ?8:42 AM ? ?PATIENT:  Julie Brewer  71 y.o. female ? ?PRE-OPERATIVE DIAGNOSIS:  melena,anemia ? ?POST-OPERATIVE DIAGNOSIS:  gastric biopsy r/o hpylori, gastric AVM treated with APC ? ?PROCEDURE:  Procedure(s): ?ESOPHAGOGASTRODUODENOSCOPY (EGD) WITH PROPOFOL (N/A) ?BIOPSY ?HOT HEMOSTASIS (ARGON PLASMA COAGULATION/BICAP) (N/A) ? ?SURGEON:  Surgeon(s) and Role: ?   * Otis Brace, MD - Primary ? ? ?Findings ?------------ ?-EGD showed mild LA grade a esophagitis at GE junction, hemorrhagic mucosa in the gastric cardia treated with APC, gastritis and small nonbleeding ulcer in the second portion of the duodenum. ? ?Recommendation ?------------------------ ?-Start renal diet ?-Repeat CBC in the morning ?-Continue twice daily PPI for now ?-GI will follow ? ?Otis Brace MD, FACP ?04/10/2021, 8:43 AM ? ?Contact #  [phone]  ?

## 2021-04-11 ENCOUNTER — Encounter (HOSPITAL_COMMUNITY): Payer: Self-pay | Admitting: Gastroenterology

## 2021-04-11 DIAGNOSIS — I10 Essential (primary) hypertension: Secondary | ICD-10-CM | POA: Diagnosis not present

## 2021-04-11 DIAGNOSIS — N186 End stage renal disease: Secondary | ICD-10-CM | POA: Diagnosis not present

## 2021-04-11 LAB — CBC
HCT: 27.1 % — ABNORMAL LOW (ref 36.0–46.0)
Hemoglobin: 8.7 g/dL — ABNORMAL LOW (ref 12.0–15.0)
MCH: 28.5 pg (ref 26.0–34.0)
MCHC: 32.1 g/dL (ref 30.0–36.0)
MCV: 88.9 fL (ref 80.0–100.0)
Platelets: 150 10*3/uL (ref 150–400)
RBC: 3.05 MIL/uL — ABNORMAL LOW (ref 3.87–5.11)
RDW: 16.5 % — ABNORMAL HIGH (ref 11.5–15.5)
WBC: 9.7 10*3/uL (ref 4.0–10.5)
nRBC: 0 % (ref 0.0–0.2)

## 2021-04-11 LAB — BASIC METABOLIC PANEL
Anion gap: 9 (ref 5–15)
BUN: 34 mg/dL — ABNORMAL HIGH (ref 8–23)
CO2: 20 mmol/L — ABNORMAL LOW (ref 22–32)
Calcium: 9.5 mg/dL (ref 8.9–10.3)
Chloride: 110 mmol/L (ref 98–111)
Creatinine, Ser: 5 mg/dL — ABNORMAL HIGH (ref 0.44–1.00)
GFR, Estimated: 9 mL/min — ABNORMAL LOW (ref >60)
Glucose, Bld: 101 mg/dL — ABNORMAL HIGH (ref 70–99)
Potassium: 4.5 mmol/L (ref 3.5–5.1)
Sodium: 139 mmol/L (ref 135–145)

## 2021-04-11 LAB — GLUCOSE, CAPILLARY
Glucose-Capillary: 104 mg/dL — ABNORMAL HIGH (ref 70–99)
Glucose-Capillary: 256 mg/dL — ABNORMAL HIGH (ref 70–99)
Glucose-Capillary: 97 mg/dL (ref 70–99)
Glucose-Capillary: 124 mg/dL — ABNORMAL HIGH (ref 70–99)

## 2021-04-11 LAB — SURGICAL PATHOLOGY

## 2021-04-11 MED ORDER — PANTOPRAZOLE SODIUM 40 MG PO TBEC
40.0000 mg | DELAYED_RELEASE_TABLET | Freq: Two times a day (BID) | ORAL | Status: DC
  Administered 2021-04-11 – 2021-04-13 (×4): 40 mg via ORAL
  Filled 2021-04-11 (×4): qty 1

## 2021-04-11 MED ORDER — AMLODIPINE BESYLATE 10 MG PO TABS
10.0000 mg | ORAL_TABLET | Freq: Every day | ORAL | Status: DC
  Administered 2021-04-12 – 2021-04-13 (×2): 10 mg via ORAL
  Filled 2021-04-11 (×2): qty 1

## 2021-04-11 MED ORDER — INSULIN GLARGINE-YFGN 100 UNIT/ML ~~LOC~~ SOLN
5.0000 [IU] | Freq: Every day | SUBCUTANEOUS | Status: DC
  Administered 2021-04-12 – 2021-04-13 (×2): 5 [IU] via SUBCUTANEOUS
  Filled 2021-04-11 (×2): qty 0.05

## 2021-04-11 MED ORDER — RENA-VITE PO TABS
1.0000 | ORAL_TABLET | Freq: Every day | ORAL | Status: DC
  Administered 2021-04-11 – 2021-04-12 (×2): 1 via ORAL
  Filled 2021-04-11 (×2): qty 1

## 2021-04-11 MED ORDER — NICOTINE POLACRILEX 2 MG MT GUM
2.0000 mg | CHEWING_GUM | OROMUCOSAL | Status: DC | PRN
  Filled 2021-04-11 (×2): qty 1

## 2021-04-11 NOTE — Progress Notes (Signed)
Initial Nutrition Assessment ? ?DOCUMENTATION CODES:  ?Not applicable ? ?INTERVENTION:  ?-continue Nepro Shake po BID, each supplement provides 425 kcal and 19 grams protein ?-renal mvi daily ? ?NUTRITION DIAGNOSIS:  ?Increased nutrient needs related to chronic illness (ESRD on HD) as evidenced by estimated needs. ? ?GOAL:  ?Patient will meet greater than or equal to 90% of their needs ? ?MONITOR:  ?PO intake, Supplement acceptance, Weight trends, Labs, I & O's ? ?REASON FOR ASSESSMENT:  ?Malnutrition Screening Tool ?  ? ?ASSESSMENT:  ?Pt with PMH significant for ESRD on HD, seizure disorder, HFpEF, type 2 DM, HTN, legally blind admitted with breakthrough seizures. ? ?3/10 - admit for breakthrough seizures ?3/12 - melena, 2 units PRBC transfused, GI consulted ?3/13 - EGD showed esophagitis, gastritis, and small nonbleeding ulcer in the 2nd portion of the duodenum  ? ?Pt reports appetite is good and states that she is doing well with oral nutrition supplement (Nepro BID). Pt agreeable to continue supplementation.  ? ?Weight history reviewed; no significant weight changes noted.  ? ?PO Intake: 75-100% x 2 recorded meals  ? ?UOP: 7x unmeasured occurrences x24 hours ?I/O: +3748m since admit ? ?EDW 59.6 kg ?Current wt 60.5 kg  ?Pt scheduled for HD today per MD ? ?Medications: colace, hectorol, ferrous sulfate, SSI TID w/ meals and at bedtime, 5 units semglee daily, protonix ?Labs: ?Recent Labs  ?Lab 04/08/21 ?0718 04/09/21 ?0188403/13/23 ?0139 04/11/21 ?0142  ?NA 137 136 141 139  ?K 3.8 4.0 4.4 4.5  ?CL 103 102 108 110  ?CO2 '24 26 24 '$ 20*  ?BUN 17 13 29* 34*  ?CREATININE 4.68* 1.95* 3.97* 5.00*  ?CALCIUM 8.9 8.1* 9.1 9.5  ?PHOS 4.9*  --   --   --   ?GLUCOSE 91 111* 109* 101*  ?CBGs: 104-256 x24 hours ? ?NUTRITION - FOCUSED PHYSICAL EXAM: ?Flowsheet Row Most Recent Value  ?Orbital Region Mild depletion  ?Upper Arm Region No depletion  ?Thoracic and Lumbar Region No depletion  ?Buccal Region No depletion  ?Temple Region No  depletion  ?Clavicle Bone Region No depletion  ?Clavicle and Acromion Bone Region No depletion  ?Scapular Bone Region No depletion  ?Dorsal Hand No depletion  ?Patellar Region Mild depletion  [unable to assess RLE due to edema]  ?Anterior Thigh Region Mild depletion  [unable to assess RLE due to edema]  ?Posterior Calf Region Moderate depletion  [unable to assess RLE due to edema]  ?Edema (RD Assessment) Moderate  [RLE]  ?Hair Reviewed  ?Eyes Reviewed  ?Mouth Reviewed  ?Skin Reviewed  ?Nails Reviewed  ? ?Diet Order:   ?Diet Order   ? ?       ?  Diet renal with fluid restriction Fluid restriction: 1200 mL Fluid; Room service appropriate? Yes; Fluid consistency: Thin  Diet effective now       ?  ? ?  ?  ? ?  ? ?EDUCATION NEEDS:  ?No education needs have been identified at this time ? ?Skin:  Skin Assessment: Reviewed RN Assessment ? ?Last BM:  3/12 ? ?Height:  ?Ht Readings from Last 1 Encounters:  ?04/10/21 '5\' 3"'$  (1.6 m)  ? ?Weight:  ?Wt Readings from Last 1 Encounters:  ?04/11/21 60.5 kg  ? ?BMI:  Body mass index is 23.63 kg/m?. ? ?Estimated Nutritional Needs:  ?Kcal:  1750-1950 ?Protein:  85-100 grams ?Fluid:  1L+UOP ? ? ?Julie Stanley, MS, RD, LDN (she/her/hers) ?RD pager number and weekend/on-call pager number located in [phone] ? ?

## 2021-04-11 NOTE — Progress Notes (Signed)
Subjective: No events overnight.  HD today. ? ?ROS: negative except above ? ?Examination ? ?Vital signs in last 24 hours: ?Temp:  [97.8 ?F (36.6 ?C)-98.2 ?F (36.8 ?C)] 97.9 ?F (36.6 ?C) (03/14 0847) ?Pulse Rate:  [82-92] 82 (03/14 0847) ?Resp:  [13-20] 20 (03/14 0847) ?BP: (135-183)/(47-70) 163/64 (03/14 0847) ?SpO2:  [90 %-100 %] 100 % (03/14 0400) ? ?General: lying in bed, NAD ?CVS: pulse-normal rate and rhythm ?RS: breathing comfortably ?Extremities: normal  ?Neuro: AOx3, blind, rest of cranial nerve grossly intact, 5/5 in all extremities except 4/5 in RLE ? ?Basic Metabolic Panel: ?Recent Labs  ?Lab 04/07/21 ?1040 04/08/21 ?0718 04/09/21 ?7591 04/10/21 ?0139 04/11/21 ?0142  ?NA 136 137 136 141 139  ?K 3.2* 3.8 4.0 4.4 4.5  ?CL 94* 103 102 108 110  ?CO2 '30 24 26 24 '$ 20*  ?GLUCOSE 200* 91 111* 109* 101*  ?BUN '11 17 13 '$ 29* 34*  ?CREATININE 4.19* 4.68* 1.95* 3.97* 5.00*  ?CALCIUM 9.5 8.9 8.1* 9.1 9.5  ?PHOS  --  4.9*  --   --   --   ? ? ?CBC: ?Recent Labs  ?Lab 04/07/21 ?1040 04/08/21 ?0718 04/09/21 ?6384 04/10/21 ?0139 04/11/21 ?0142  ?WBC 8.5 7.2 9.8 10.3 9.7  ?NEUTROABS 6.5  --  7.4  --   --   ?HGB 10.3* 8.7* 5.5* 8.8* 8.7*  ?HCT 32.3* 26.9* 16.8* 25.8* 27.1*  ?MCV 85.2 83.8 82.0 86.6 88.9  ?PLT 235 213 184 163 150  ? ? ? ?Coagulation Studies: ?No results for input(s): LABPROT, INR in the last 72 hours. ? ?Imaging ?No new brain imaging overnight ? ?ASSESSMENT AND PLAN: tonic-clonic seizure-like activity with worsening frequency despite increasing lamotrigine. ?  ?Epilepsy with breakthrough seizure ?-No ictal-interictal activity overnight. ?  ?Recommendations ?-Continue video EEG for characterization of spells ?- We will continue to hold lamotrigine for seizure provocation ?-Per daughter, she just realized that patient usually smokes after coming back from dialysis and these episodes happen after back.  Unfortunately we cannot allow smoking in the hospital.  I have ordered a nicotine gum. ?-Continue seizure  precautions ?- PRN IV Ativan 2 mg for seizure-like activity ?-Management of rest of comorbidities per primary team ?  ?I have spent a total of  25 minutes with the patient reviewing hospital notes,  test results, labs and examining the patient as well as establishing an assessment and plan that was discussed personally with the patient and daughter at bedside.  > 50% of time was spent in direct patient care. ? ? ?Zeb Comfort ?Epilepsy ?[facility] ?For questions after 5pm please refer to AMION to reach the Neurologist on call ? ?

## 2021-04-11 NOTE — Progress Notes (Addendum)
?PROGRESS NOTE ? ? ? ?Julie Brewer  XTG:[id number] DOB: 1950/11/12 DOA: 04/07/2021 ?PCP: Glenis Smoker, MD  ? ?  ?Brief Narrative:  ?Patient is a 71 y.o.  female with history of ESRD on HD, seizure disorder, HFpEF, DM-2, HTN, legally blind who presented to the hospital with breakthrough seizures.  Apparently patient has had several medication adjustment in the outpatient setting but continued with increasing frequency of seizures.  Evaluated by neurology and was started on prolonged LTM EEG.  ?  ?Significant events: ?3/10>> admit to [facility] for breakthrough seizures ?3/12>> melena, 2u prbc transfused. GI consulted  ?  ?Significant studies: ?3/10>> CT head: No acute intracranial abnormality ?3/10>> Continuous EEG started  ?3/13>> EGD showed mild LA grade a esophagitis at GE junction, hemorrhagic mucosa in the gastric cardia treated with APC, gastritis and small nonbleeding ulcer in the second portion of the duodenum. ?  ?Significant microbiology data: ?3/10>> COVID/influenza PCR: Negative ? ? ?New events last 24 hours / Subjective: ?Undergoing video EEG right now. Tolerating diet, last BM was yesterday, no chest pain or sob. ? ?Assessment & Plan: ?  ?Principal Problem: ?  Seizure ([place]) ? ? ?Breakthrough seizure ?-Neurology following ?-Continue to hold Lamictal ?-Continue to monitor on LTM EEG, hoping to record seizure ?- lorazepam prn ? ?ESRD on HD TTS ?-Nephrology following, plan for hemodialysis today, has left arm AV graft ? ?Acute blood loss anemia, melena ?-S/p 2 unit packed red blood cell ?-GI consulted, EGD showed mild LA grade a esophagitis at GE junction, hemorrhagic mucosa in the gastric cardia treated with APC, gastritis and small nonbleeding ulcer in the second portion of the duodenum ?-Hold aspirin ?-Continue PPI protonix 40 bid for 4 weeks per GI, then 40 daily ?-Monitor Hgb, currently stable ? ?Chronic diastolic heart failure ?-Volume status remains stable, fluid management with  dialysis ? ?Hypertension ?BP persistently elevated ?- increase amlodipine to 10  ? ?Hyperlipidemia ?-Lipitor  ? ?Diabetes mellitus, well controlled ?-A1c 6.1 ?-Continue Semglee, sliding scale insulin ? ?DVT prophylaxis:  ?Place and maintain sequential compression device Start: 04/09/21 0630 ? ?Code Status: DNR ?Family Communication: None at bedside ?Disposition Plan:  ?Status is: Inpatient ?Remains inpatient appropriate because: continuous EEG  ? ? ?Antimicrobials:  ?Anti-infectives (From admission, onward)  ? ? None  ? ?  ? ? ? ?Objective: ?Vitals:  ? 04/10/21 2300 04/11/21 0400 04/11/21 0847 04/11/21 1223  ?BP: (!) 135/47 (!) 150/65 (!) 163/64 (!) 187/66  ?Pulse: 92  82 88  ?Resp: '19 17 20 20  '$ ?Temp: 98 ?F (36.7 ?C)  97.9 ?F (36.6 ?C) 98 ?F (36.7 ?C)  ?TempSrc: Oral  Oral Oral  ?SpO2: 100% 100%  98%  ?Weight:      ?Height:      ? ?No intake or output data in the 24 hours ending 04/11/21 1228 ?Filed Weights  ? 04/09/21 0145 04/09/21 0615 04/10/21 0721  ?Weight: 60 kg 60.5 kg 60.5 kg  ? ? ?Examination:  ?General exam: Appears calm and comfortable  ?Respiratory system: Clear to auscultation. Respiratory effort normal. No respiratory distress. No conversational dyspnea.  ?Cardiovascular system: S1 & S2 heard, RR. No murmurs. No pedal edema. ?Gastrointestinal system: Abdomen is nondistended, soft and nontender. Normal bowel sounds heard. ?Central nervous system: Alert and oriented. No focal neurological deficits. Speech clear.  ?Extremities: Symmetric in appearance  ?Skin: No rashes, lesions or ulcers on exposed skin  ?Psychiatry: Judgement and insight appear normal. Mood & affect appropriate.  ? ?Data Reviewed: I have personally reviewed following  labs and imaging studies ? ?CBC: ?Recent Labs  ?Lab 04/07/21 ?1040 04/08/21 ?0718 04/09/21 ?7681 04/10/21 ?0139 04/11/21 ?0142  ?WBC 8.5 7.2 9.8 10.3 9.7  ?NEUTROABS 6.5  --  7.4  --   --   ?HGB 10.3* 8.7* 5.5* 8.8* 8.7*  ?HCT 32.3* 26.9* 16.8* 25.8* 27.1*  ?MCV 85.2 83.8  82.0 86.6 88.9  ?PLT 235 213 184 163 150  ? ?Basic Metabolic Panel: ?Recent Labs  ?Lab 04/07/21 ?1040 04/08/21 ?0718 04/09/21 ?1572 04/10/21 ?0139 04/11/21 ?0142  ?NA 136 137 136 141 139  ?K 3.2* 3.8 4.0 4.4 4.5  ?CL 94* 103 102 108 110  ?CO2 '30 24 26 24 '$ 20*  ?GLUCOSE 200* 91 111* 109* 101*  ?BUN '11 17 13 '$ 29* 34*  ?CREATININE 4.19* 4.68* 1.95* 3.97* 5.00*  ?CALCIUM 9.5 8.9 8.1* 9.1 9.5  ?PHOS  --  4.9*  --   --   --   ? ?GFR: ?Estimated Creatinine Clearance: 8.9 mL/min (A) (by C-G formula based on SCr of 5 mg/dL (H)). ?Liver Function Tests: ?Recent Labs  ?Lab 04/07/21 ?1040 04/08/21 ?0718 04/09/21 ?0543  ?AST 14*  --  18  ?ALT 13  --  16  ?ALKPHOS 78  --  59  ?BILITOT 0.4  --  0.4  ?PROT 6.2*  --  4.7*  ?ALBUMIN 3.4* 2.8* 2.5*  ? ?No results for input(s): LIPASE, AMYLASE in the last 168 hours. ?No results for input(s): AMMONIA in the last 168 hours. ?Coagulation Profile: ?No results for input(s): INR, PROTIME in the last 168 hours. ?Cardiac Enzymes: ?No results for input(s): CKTOTAL, CKMB, CKMBINDEX, TROPONINI in the last 168 hours. ?BNP (last 3 results) ?No results for input(s): PROBNP in the last 8760 hours. ?HbA1C: ?No results for input(s): HGBA1C in the last 72 hours. ?CBG: ?Recent Labs  ?Lab 04/10/21 ?6203 04/10/21 ?1258 04/10/21 ?1640 04/10/21 ?2137 04/11/21 ?5597  ?GLUCAP 103* 152* 126* 151* 104*  ? ?Lipid Profile: ?No results for input(s): CHOL, HDL, LDLCALC, TRIG, CHOLHDL, LDLDIRECT in the last 72 hours. ?Thyroid Function Tests: ?No results for input(s): TSH, T4TOTAL, FREET4, T3FREE, THYROIDAB in the last 72 hours. ?Anemia Panel: ?No results for input(s): VITAMINB12, FOLATE, FERRITIN, TIBC, IRON, RETICCTPCT in the last 72 hours. ?Sepsis Labs: ?Recent Labs  ?Lab 04/07/21 ?1040 04/07/21 ?1227  ?LATICACIDVEN 2.7* 2.0*  ? ? ?Recent Results (from the past 240 hour(s))  ?Resp Panel by RT-PCR (Flu A&B, Covid) Nasopharyngeal Swab     Status: None  ? Collection Time: 04/07/21 11:06 AM  ? Specimen: Nasopharyngeal  Swab; Nasopharyngeal(NP) swabs in vial transport medium  ?Result Value Ref Range Status  ? SARS Coronavirus 2 by RT PCR NEGATIVE NEGATIVE Final  ?  Comment: (NOTE) ?SARS-CoV-2 target nucleic acids are NOT DETECTED. ? ?The SARS-CoV-2 RNA is generally detectable in upper respiratory ?specimens during the acute phase of infection. The lowest ?concentration of SARS-CoV-2 viral copies this assay can detect is ?138 copies/mL. A negative result does not preclude SARS-Cov-2 ?infection and should not be used as the sole basis for treatment or ?other patient management decisions. A negative result may occur with  ?improper specimen collection/handling, submission of specimen other ?than nasopharyngeal swab, presence of viral mutation(s) within the ?areas targeted by this assay, and inadequate number of viral ?copies(<138 copies/mL). A negative result must be combined with ?clinical observations, patient history, and epidemiological ?information. The expected result is Negative. ? ?Fact Sheet for Patients:  ?EntrepreneurPulse.com.au ? ?Fact Sheet for Healthcare Providers:  ?IncredibleEmployment.be ? ?This test is no t  yet approved or cleared by the [place] and  ?has been authorized for detection and/or diagnosis of SARS-CoV-2 by ?FDA under an Emergency Use Authorization (EUA). This EUA will remain  ?in effect (meaning this test can be used) for the duration of the ?COVID-19 declaration under Section 564(b)(1) of the Act, 21 ?U.S.C.section 360bbb-3(b)(1), unless the authorization is terminated  ?or revoked sooner.  ? ? ?  ? Influenza A by PCR NEGATIVE NEGATIVE Final  ? Influenza B by PCR NEGATIVE NEGATIVE Final  ?  Comment: (NOTE) ?The Xpert Xpress SARS-CoV-2/FLU/RSV plus assay is intended as an aid ?in the diagnosis of influenza from Nasopharyngeal swab specimens and ?should not be used as a sole basis for treatment. Nasal washings and ?aspirates are unacceptable for Xpert Xpress  SARS-CoV-2/FLU/RSV ?testing. ? ?Fact Sheet for Patients: ?EntrepreneurPulse.com.au ? ?Fact Sheet for Healthcare Providers: ?IncredibleEmployment.be ? ?This test is not yet approved or c

## 2021-04-11 NOTE — Progress Notes (Signed)
EEG maint complete. No skin breakdown under fp1, fp2, o2 ? ?

## 2021-04-11 NOTE — Procedures (Addendum)
Patient Name: Julie Brewer  ?MRN: [id number]  ?Epilepsy Attending: Lora Havens  ?Referring Physician/Provider: Amie Portland, MD ?Duration:  04/10/2021 1628 to 04/11/2021 1628  ?  ?Patient history:  71 y.o. female with past medical history of ESRD (T/TH/S), CHF, DM, HTN, HLD, seizures, prior stroke, PVD, and blindness who presents to the [facility] for evaluation of seizure this am. Home Lamictal dose was increased to '200mg'$  BID this past Friday due to increasing frequency of seizures despite compliance to medications.  Seizures usually happen around the time dialysis. EEG to evaluate for seizure ?  ?Level of alertness: Awake, asleep ?  ?AEDs during EEG study: None ?  ?Technical aspects: This EEG study was done with scalp electrodes positioned according to the 10-20 International system of electrode placement. Electrical activity was acquired at a sampling rate of '500Hz'$  and reviewed with a high frequency filter of '70Hz'$  and a low frequency filter of '1Hz'$ . EEG data were recorded continuously and digitally stored.  ?  ?Description: The posterior dominant rhythm consists of 8 Hz activity of moderate voltage (25-35 uV) seen predominantly in posterior head regions, symmetric and reactive to eye opening and eye closing. Sleep was characterized by vertex waves, sleep spindles (12 to 14 Hz), maximal frontocentral region. Hyperventilation and photic stimulation were not performed.    ?  ?IMPRESSION: ?This study is within normal limits. No seizures or epileptiform discharges were seen throughout the recording. ?  ?Lora Havens  ?

## 2021-04-11 NOTE — Progress Notes (Signed)
[facility] Progress Note ? ?Julie Brewer 71 y.o. July 26, 1950 ? ?CC: GI bleed ? ? ?Subjective: ?Patient seen and examined at bedside.  Denies any further bleeding episodes.  Denies abdominal pain.  Tolerating diet. ? ?ROS : Afebrile, negative for chest pain ? ? ?Objective: ?Vital signs in last 24 hours: ?Vitals:  ? 04/11/21 0400 04/11/21 0847  ?BP: (!) 150/65 (!) 163/64  ?Pulse:  82  ?Resp: 17 20  ?Temp:  97.9 ?F (36.6 ?C)  ?SpO2: 100%   ? ? ?Physical Exam: ? ?General:  Alert, cooperative, no distress, appears stated age  ?Head:  Normocephalic, without obvious abnormality, atraumatic  ?Eyes:  , EOM's intact,   ?Lungs:   No visible respiratory distress, anterior exam only  ?Heart:  Regular rate and rhythm, S1, S2 normal  ?Abdomen:   Soft, non-tender, nondistended, bowel sound present  ?   ?   ? ? ?Lab Results: ?Recent Labs  ?  04/10/21 ?0139 04/11/21 ?0142  ?NA 141 139  ?K 4.4 4.5  ?CL 108 110  ?CO2 24 20*  ?GLUCOSE 109* 101*  ?BUN 29* 34*  ?CREATININE 3.97* 5.00*  ?CALCIUM 9.1 9.5  ? ?Recent Labs  ?  04/09/21 ?0543  ?AST 18  ?ALT 16  ?ALKPHOS 59  ?BILITOT 0.4  ?PROT 4.7*  ?ALBUMIN 2.5*  ? ?Recent Labs  ?  04/09/21 ?0998 04/10/21 ?0139 04/11/21 ?0142  ?WBC 9.8 10.3 9.7  ?NEUTROABS 7.4  --   --   ?HGB 5.5* 8.8* 8.7*  ?HCT 16.8* 25.8* 27.1*  ?MCV 82.0 86.6 88.9  ?PLT 184 163 150  ? ?No results for input(s): LABPROT, INR in the last 72 hours. ? ? ? ?Assessment/Plan: ?-Melena. EGD showed mild LA grade a esophagitis at GE junction, hemorrhagic mucosa in the gastric cardia treated with APC, gastritis and small nonbleeding ulcer in the second portion of the duodenum. ?-End-stage renal disease on dialysis. ? ?Recommendations ?------------------------- ?-No further bleeding.  Hemoglobin stable ?-Continue Protonix 40 mg twice a day for 4 weeks followed by Protonix 40 mg once a day ?-No further inpatient GI work-up planned.  GI will sign off.  Call us back if needed ? ? ?Otis Brace MD, FACP ?04/11/2021,  9:34 AM ? ?Contact #  [phone]  ?

## 2021-04-11 NOTE — Progress Notes (Addendum)
[facility] ?Progress Note ? ?  ?OP HD: [facility] last HD 3/09  ? 3h  400/1.5   59.6kg   2/2 bath   LUA AVG  Heparin 4500 ? - hect 10 ug IV tiw ? - mircera 30 mcg q4, last 3/02, due 3/16 ?  ?  ?Assessment/ Plan: ?Seizure episodes - per pmd and neurology. Is on continuous EEG monitoring.  ?GI bleed - bloody stools and Hb down to 5- 6 range -> 2u prbcs. EGD gastric AVM APC, small nonbleeding ulcer in 2nd portion of duodenum.  ?ESRD - on HD TTS.  Next HD on 3/14 -> will try to get her on for 1st shift; unfortunately she can't be moved until completion of the EEG. Last HD on 3/12 so if we have to we can HD tomorrow if needed.  ?Hypokalemia - better, 4.0 3/12.  ?Volume - no vol overload by exam, at dry wt ?HTN - hold norvasc w/ GIB ?DM2 - on insulin per pmd ?Anemia ckd - next ESA due on 3/16. Hb down w/ ABL -> now up to 8.7 and fairly stable. 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 Cont vdra.  ?  ?  ? ?Subjective: Pt seen in room; feeling better. No seizures since admission here; EEG ongoing.  ? ?Vitals:  ? 04/10/21 1626 04/10/21 2100 04/10/21 2300 04/11/21 0400  ?BP: (!) 162/64 (!) 183/70 (!) 135/47 (!) 150/65  ?Pulse: 85 85 92   ?Resp: 20  19 17   ?Temp: 98 ?F (36.7 ?C) 97.8 ?F (36.6 ?C) 98 ?F (36.7 ?C)   ?TempSrc: Oral Oral Oral   ?SpO2: 97% 100% 100%   ?Weight:      ?Height:      ? ? ?Exam: ?Gen alert, no distress ?Sclera anicteric, throat clear  ?No jvd or bruits ?Chest clear bilat to bases ?RRR no RG ?Abd soft ntnd no mass or ascites +bs ?Ext no LE or UE edema ?Neuro is alert, Ox 3 , nf ?  LUA AVG+bruit ?  ?  ? Home meds include - norvasc 5, lipitor, asa, pletal, neurontin 100 bid, insulin lantus/ regular, lamcital, prilosec, prns/ vits / supps ? ? ? ?Recent Labs  ?Lab 04/08/21 ?0718 04/09/21 ?9292 04/10/21 ?0139 04/11/21 ?0142  ?HGB 8.7* 5.5* 8.8* 8.7*  ?ALBUMIN 2.8* 2.5*  --   --   ?CALCIUM 8.9 8.1* 9.1 9.5  ?PHOS 4.9*  --   --   --   ?CREATININE 4.68* 1.95* 3.97* 5.00*  ?K 3.8 4.0 4.4 4.5  ? ?Inpatient  medications: ? amLODipine  5 mg Oral Daily  ? atorvastatin  20 mg Oral Daily  ? chlorhexidine gluconate (MEDLINE KIT)  15 mL Mouth Rinse BID  ? Chlorhexidine Gluconate Cloth  6 each Topical Daily  ? Chlorhexidine Gluconate Cloth  6 each Topical Q0600  ? docusate sodium  200 mg Oral Daily  ? doxercalciferol  10 mcg Intravenous Q T,Th,Sa-HD  ? feeding supplement (NEPRO CARB STEADY)  237 mL Oral BID BM  ? ferrous sulfate  650 mg Oral Daily  ? insulin aspart  0-5 Units Subcutaneous QHS  ? insulin aspart  0-6 Units Subcutaneous TID WC  ? insulin glargine-yfgn  7 Units Subcutaneous Daily  ? mouth rinse  15 mL Mouth Rinse Q4H  ? pantoprazole (PROTONIX) IV  40 mg Intravenous Q12H  ? ? ?acetaminophen, bisacodyl, hydrALAZINE, LORazepam ? ? ? ? ? ? ?

## 2021-04-11 NOTE — Progress Notes (Signed)
Pt refused to take nicotine gum. Pt alert and oriented x4. ?

## 2021-04-11 NOTE — Progress Notes (Signed)
LTM maint complete - no skin breakdown under: Fp1 Fp2 F8 ?[facility] monitored, Event button test confirmed by [facility]. ? ? ?

## 2021-04-12 DIAGNOSIS — I739 Peripheral vascular disease, unspecified: Secondary | ICD-10-CM

## 2021-04-12 DIAGNOSIS — I10 Essential (primary) hypertension: Secondary | ICD-10-CM | POA: Diagnosis not present

## 2021-04-12 DIAGNOSIS — R569 Unspecified convulsions: Secondary | ICD-10-CM | POA: Diagnosis not present

## 2021-04-12 DIAGNOSIS — E785 Hyperlipidemia, unspecified: Secondary | ICD-10-CM | POA: Diagnosis not present

## 2021-04-12 LAB — GLUCOSE, CAPILLARY
Glucose-Capillary: 103 mg/dL — ABNORMAL HIGH (ref 70–99)
Glucose-Capillary: 158 mg/dL — ABNORMAL HIGH (ref 70–99)
Glucose-Capillary: 163 mg/dL — ABNORMAL HIGH (ref 70–99)
Glucose-Capillary: 128 mg/dL — ABNORMAL HIGH (ref 70–99)

## 2021-04-12 LAB — CBC
HCT: 27.6 % — ABNORMAL LOW (ref 36.0–46.0)
Hemoglobin: 9.2 g/dL — ABNORMAL LOW (ref 12.0–15.0)
MCH: 28.9 pg (ref 26.0–34.0)
MCHC: 33.3 g/dL (ref 30.0–36.0)
MCV: 86.8 fL (ref 80.0–100.0)
Platelets: 154 10*3/uL (ref 150–400)
RBC: 3.18 MIL/uL — ABNORMAL LOW (ref 3.87–5.11)
RDW: 16.4 % — ABNORMAL HIGH (ref 11.5–15.5)
WBC: 8.7 10*3/uL (ref 4.0–10.5)
nRBC: 0 % (ref 0.0–0.2)

## 2021-04-12 MED ORDER — DARBEPOETIN ALFA 40 MCG/0.4ML IJ SOSY: 40.0000 ug | PREFILLED_SYRINGE | INTRAMUSCULAR | Status: DC

## 2021-04-12 MED ORDER — LAMOTRIGINE 100 MG PO TABS
200.0000 mg | ORAL_TABLET | Freq: Two times a day (BID) | ORAL | Status: DC
  Administered 2021-04-12 – 2021-04-13 (×2): 200 mg via ORAL
  Filled 2021-04-12 (×2): qty 2

## 2021-04-12 NOTE — Progress Notes (Addendum)
Julie Brewer ?Progress Note ? ?  ?OP HD: [facility] 3/09  ? 3h  400/1.5   59.6kg   2/2 bath   LUA AVG  Heparin 4500 ? - hect 10 ug IV tiw ? - mircera 30 mcg q4, last 3/02, due 3/16 ?  ?  ?Assessment/ Plan: ?Seizure episodes - per pmd and neurology. Is on continuous EEG monitoring.  ?GI bleed - bloody stools and Hb down to 5- 6 range -> 2u prbcs. EGD gastric AVM APC, small nonbleeding ulcer in 2nd portion of duodenum.  ?ESRD - on HD TTS.  HD on 3/14 2L net UF. Next HD tomorrow. ?Hypokalemia - better, 4.0 3/12.  ?Volume - no vol overload by exam, at dry wt ?HTN - hold norvasc w/ GIB ?DM2 - on insulin per pmd ?Anemia ckd - next ESA due on 3/16. Hb down w/ ABL -> now up to  9.2 and fairly stable. 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 Cont vdra.  ?  ?  ? ?Subjective: Pt seen in room; feeling better. No seizures since admission here; EEG ongoing. Wondering when she's going home; moving her legs as much as she can while in bed. ? ?Vitals:  ? 04/11/21 1950 04/11/21 2000 04/11/21 2344 04/12/21 0325  ?BP: 124/60 (!) 146/67 (!) 150/58 131/72  ?Pulse: 93 85    ?Resp: (!) 23 18 20 20   ?Temp:   98.5 ?F (36.9 ?C) 98.4 ?F (36.9 ?C)  ?TempSrc:   Oral Oral  ?SpO2:  98%    ?Weight:      ?Height:      ? ? ?Exam: ?Gen alert, no distress ?Sclera anicteric, throat clear  ?No jvd or bruits ?Chest clear bilat to bases ?RRR no RG ?Abd soft ntnd no mass or ascites +bs ?Ext no LE or UE edema ?Neuro is alert, Ox 3 , nf ?  LUA AVG+bruit ?  ?  ? Home meds include - norvasc 5, lipitor, asa, pletal, neurontin 100 bid, insulin lantus/ regular, lamcital, prilosec, prns/ vits / supps ? ? ? ? ?Recent Labs  ?Lab 04/08/21 ?0718 04/09/21 ?2549 04/10/21 ?0139 04/11/21 ?0142 04/12/21 ?8264  ?HGB 8.7* 5.5* 8.8* 8.7* 9.2*  ?ALBUMIN 2.8* 2.5*  --   --   --   ?CALCIUM 8.9 8.1* 9.1 9.5  --   ?PHOS 4.9*  --   --   --   --   ?CREATININE 4.68* 1.95* 3.97* 5.00*  --   ?K 3.8 4.0 4.4 4.5  --   ? ?Inpatient medications: ? amLODipine  10 mg Oral Daily  ?  atorvastatin  20 mg Oral Daily  ? chlorhexidine gluconate (MEDLINE KIT)  15 mL Mouth Rinse BID  ? Chlorhexidine Gluconate Cloth  6 each Topical Daily  ? Chlorhexidine Gluconate Cloth  6 each Topical Q0600  ? docusate sodium  200 mg Oral Daily  ? doxercalciferol  10 mcg Intravenous Q T,Th,Sa-HD  ? feeding supplement (NEPRO CARB STEADY)  237 mL Oral BID BM  ? ferrous sulfate  650 mg Oral Daily  ? insulin aspart  0-5 Units Subcutaneous QHS  ? insulin aspart  0-6 Units Subcutaneous TID WC  ? insulin glargine-yfgn  5 Units Subcutaneous Daily  ? mouth rinse  15 mL Mouth Rinse Q4H  ? multivitamin  1 tablet Oral QHS  ? pantoprazole  40 mg Oral BID  ? ? ?acetaminophen, bisacodyl, hydrALAZINE, LORazepam, nicotine polacrilex ? ? ? ? ? ? ?

## 2021-04-12 NOTE — Progress Notes (Signed)
EEG maintenance performed.  No skin breakdown observed at electrode sites Fp1, Fp2. 

## 2021-04-12 NOTE — Procedures (Addendum)
MRN: [id number]  ?Epilepsy Attending: Lora Havens  ?Referring Physician/Provider: Amie Portland, MD ?Duration:  04/11/2021 1628 to 04/12/2021 1628 ?  ?Patient history:  71 y.o. female with past medical history of ESRD (T/TH/S), CHF, DM, HTN, HLD, seizures, prior stroke, PVD, and blindness who presents to the [facility] for evaluation of seizure this am. Home Lamictal dose was increased to '200mg'$  BID this past Friday due to increasing frequency of seizures despite compliance to medications.  Seizures usually happen around the time dialysis. EEG to evaluate for seizure ?  ?Level of alertness: Awake, asleep ?  ?AEDs during EEG study: None ?  ?Technical aspects: This EEG study was done with scalp electrodes positioned according to the 10-20 International system of electrode placement. Electrical activity was acquired at a sampling rate of '500Hz'$  and reviewed with a high frequency filter of '70Hz'$  and a low frequency filter of '1Hz'$ . EEG data were recorded continuously and digitally stored.  ?  ?Description: The posterior dominant rhythm consists of 8 Hz activity of moderate voltage (25-35 uV) seen predominantly in posterior head regions, symmetric and reactive to eye opening and eye closing. Sleep was characterized by vertex waves, sleep spindles (12 to 14 Hz), maximal frontocentral region. Hyperventilation and photic stimulation were not performed.    ?  ?IMPRESSION: ?This study is within normal limits. No seizures or epileptiform discharges were seen throughout the recording. ?  ?Lora Havens  ?

## 2021-04-12 NOTE — Progress Notes (Signed)
LTM maint complete - no skin breakdown under: Fp2 F7 and F3 ?Serviced several leads ?[facility] monitored, Event button test confirmed by [facility]. ? ?

## 2021-04-12 NOTE — Progress Notes (Signed)
?      ?                 PROGRESS NOTE ? ?      ?PATIENT DETAILS ?Name: Julie Brewer ?Age: 71 y.o. ?Sex: female ?Date of Birth: 26-Mar-1950 ?Admit Date: 04/07/2021 ?Admitting Physician Jonetta Osgood, MD ?SNK:[id number], Anastasia Pall, MD ? ?Brief Summary: ?Patient is a 72 y.o.  female with history of ESRD on HD, seizure disorder, HFpEF, DM-2, HTN, legally blind-who presented to the hospital with breakthrough seizures.  Apparently patient has had several medication adjustment in the outpatient setting-with increasing frequency of seizures.  Evaluated by neurology-with plans to to continue prolonged LTM EEG for several days.  Hospital course complicated by see below for further details.   ? ?Significant events: ?3/10>> admit to [facility] for breakthrough seizures ?3/12>> melena, 2u prbc transfused. GI consulted  ? ?Significant studies: ?3/10>> CT head: No acute intracranial abnormality. ? ?Significant microbiology data: ?3/10>> COVID/influenza PCR: Negative ? ?Procedures: ?3/13>> EGD showed mild LA grade a esophagitis at GE junction, hemorrhagic mucosa in the gastric cardia treated with APC, gastritis and small nonbleeding ulcer in the second portion of the duodenum. ? ?Consults: ?Neurology, nephrology ? ?Subjective: ?No major issues overnight-inquiring about discharge. ? ?Objective: ?Vitals: ?Blood pressure (!) 144/65, pulse 81, temperature 97.9 ?F (36.6 ?C), temperature source Oral, resp. rate 12, height _0  (1.6 m), weight 60.5 kg, SpO2 98 %.  ? ?Exam: ?Gen Exam:Alert awake-not in any distress ?HEENT:atraumatic, normocephalic ?Chest: B/L clear to auscultation anteriorly ?CVS:S1S2 regular ?Abdomen:soft non tender, non distended ?Extremities:no edema ?Neurology: Non focal ?Skin: no rash  ? ?Pertinent Labs/Radiology: ?CBC Latest Ref Rng & Units 04/12/2021 04/11/2021 04/10/2021  ?WBC 4.0 - 10.5 K/uL 8.7 9.7 10.3  ?Hemoglobin 12.0 - 15.0 g/dL 9.2(L) 8.7(L) 8.8(L)  ?Hematocrit 36.0 - 46.0 % 27.6(L) 27.1(L) 25.8(L)   ?Platelets 150 - 400 K/uL 154 150 163  ?  ?Lab Results  ?Component Value Date  ? NA 139 04/11/2021  ? K 4.5 04/11/2021  ? CL 110 04/11/2021  ? CO2 20 (L) 04/11/2021  ?  ? ? ?Assessment/Plan: ?Breakthrough seizures: Neurology following-LTM EEG in progress-negative for seizures so far-lamotrigine being restarted today-neurology planning to continue LTM EEG overnight-potential discharge home tomorrow if EEG remains negative.   ? ?Upper GI bleeding with acute blood loss anemia: Seems to have resolved-hemoglobin stable-follow closely-on PPI.  GI has signed off. ? ?ESRD on HD TTS: Nephrology consulted-defer to nephrology. ? ?Chronic HFpEF: Volume status stable-fluid removal with HD. ? ?HTN: BP stable-continue Norvasc ? ?HLD: Continue statin ? ?PAD: Antiplatelets on hold due to upper GI bleeding. ? ?DM-2 (A1c 6.1 on 3/10): CBGs relatively stable-continue 7 units of Semglee daily and SSI. ? ?Recent Labs  ?  04/11/21 ?1712 04/11/21 ?2059 04/12/21 ?0803  ?GLUCAP 97 124* 103*  ? ?  ?Legally blind ? ?BMI: ?Estimated body mass index is 23.63 kg/m? as calculated from the following: ?  Height as of this encounter: _1  (1.6 m). ?  Weight as of this encounter: 60.5 kg.  ? ?Code status: ?  Code Status: DNR  ? ?DVT Prophylaxis: ?Place and maintain sequential compression device Start: 04/09/21 0630 ?  ?Family Communication: None at bedside ? ? ?Disposition Plan: ?Status is: Observation ?The patient will require care spanning > 2 midnights and should be moved to inpatient because: Prolonged LTM EEG in progress-plan for several days-not stable for discharge.  Probably discharge home on 3/16. ?  ?Planned Discharge Destination:Home ? ? ?Diet: ?  Diet Order   ? ?       ?  Diet renal with fluid restriction Fluid restriction: 1200 mL Fluid; Room service appropriate? Yes; Fluid consistency: Thin  Diet effective now       ?  ? ?  ?  ? ?  ?  ? ? ?Antimicrobial agents: ?Anti-infectives (From admission, onward)  ? ? None  ? ?   ? ? ? ?MEDICATIONS: ?Scheduled Meds: ? amLODipine  10 mg Oral Daily  ? atorvastatin  20 mg Oral Daily  ? chlorhexidine gluconate (MEDLINE KIT)  15 mL Mouth Rinse BID  ? Chlorhexidine Gluconate Cloth  6 each Topical Daily  ? Chlorhexidine Gluconate Cloth  6 each Topical Q0600  ? darbepoetin (ARANESP) injection - DIALYSIS  40 mcg Intravenous Q Wed-HD  ? docusate sodium  200 mg Oral Daily  ? doxercalciferol  10 mcg Intravenous Q T,Th,Sa-HD  ? feeding supplement (NEPRO CARB STEADY)  237 mL Oral BID BM  ? ferrous sulfate  650 mg Oral Daily  ? insulin aspart  0-5 Units Subcutaneous QHS  ? insulin aspart  0-6 Units Subcutaneous TID WC  ? insulin glargine-yfgn  5 Units Subcutaneous Daily  ? lamoTRIgine  200 mg Oral BID  ? mouth rinse  15 mL Mouth Rinse Q4H  ? multivitamin  1 tablet Oral QHS  ? pantoprazole  40 mg Oral BID  ? ?Continuous Infusions: ?PRN Meds:.acetaminophen, bisacodyl, hydrALAZINE, LORazepam, nicotine polacrilex ? ? ?I have personally reviewed following labs and imaging studies ? ?LABORATORY DATA: ?CBC: ?Recent Labs  ?Lab 04/07/21 ?1040 04/08/21 ?0718 04/09/21 ?1191 04/10/21 ?0139 04/11/21 ?0142 04/12/21 ?4782  ?WBC 8.5 7.2 9.8 10.3 9.7 8.7  ?NEUTROABS 6.5  --  7.4  --   --   --   ?HGB 10.3* 8.7* 5.5* 8.8* 8.7* 9.2*  ?HCT 32.3* 26.9* 16.8* 25.8* 27.1* 27.6*  ?MCV 85.2 83.8 82.0 86.6 88.9 86.8  ?PLT 235 213 184 163 150 154  ? ? ? ?Basic Metabolic Panel: ?Recent Labs  ?Lab 04/07/21 ?1040 04/08/21 ?0718 04/09/21 ?9562 04/10/21 ?0139 04/11/21 ?0142  ?NA 136 137 136 141 139  ?K 3.2* 3.8 4.0 4.4 4.5  ?CL 94* 103 102 108 110  ?CO2 _0 20*  ?GLUCOSE 200* 91 111* 109* 101*  ?BUN _1 29* 34*  ?CREATININE 4.19* 4.68* 1.95* 3.97* 5.00*  ?CALCIUM 9.5 8.9 8.1* 9.1 9.5  ?PHOS  --  4.9*  --   --   --   ? ? ? ?GFR: ?Estimated Creatinine Clearance: 8.9 mL/min (A) (by C-G formula based on SCr of 5 mg/dL (H)). ? ?Liver Function Tests: ?Recent Labs  ?Lab 04/07/21 ?1040 04/08/21 ?0718 04/09/21 ?0543  ?AST 14*  --  18   ?ALT 13  --  16  ?ALKPHOS 78  --  59  ?BILITOT 0.4  --  0.4  ?PROT 6.2*  --  4.7*  ?ALBUMIN 3.4* 2.8* 2.5*  ? ? ?No results for input(s): LIPASE, AMYLASE in the last 168 hours. ?No results for input(s): AMMONIA in the last 168 hours. ? ?Coagulation Profile: ?No results for input(s): INR, PROTIME in the last 168 hours. ? ?Cardiac Enzymes: ?No results for input(s): CKTOTAL, CKMB, CKMBINDEX, TROPONINI in the last 168 hours. ? ?BNP (last 3 results) ?No results for input(s): PROBNP in the last 8760 hours. ? ?Lipid Profile: ?No results for input(s): CHOL, HDL, LDLCALC, TRIG, CHOLHDL, LDLDIRECT in the last 72 hours. ? ?Thyroid Function Tests: ?No results for input(s): TSH,  T4TOTAL, FREET4, T3FREE, THYROIDAB in the last 72 hours. ? ?Anemia Panel: ?No results for input(s): VITAMINB12, FOLATE, FERRITIN, TIBC, IRON, RETICCTPCT in the last 72 hours. ? ?Urine analysis: ?   ?Component Value Date/Time  ? [place] YELLOW 02/10/2019 1223  ? APPEARANCEUR CLEAR 02/10/2019 1223  ? LABSPEC 1.011 02/10/2019 1223  ? LABSPEC 1.010 03/09/2016 1400  ? PHURINE 5.0 02/10/2019 1223  ? GLUCOSEU NEGATIVE 02/10/2019 1223  ? GLUCOSEU NEGATIVE 03/11/2018 0958  ? GLUCOSEU Negative 03/09/2016 1400  ? HGBUR SMALL (A) 02/10/2019 1223  ? [place] NEGATIVE 02/10/2019 1223  ? BILIRUBINUR Negative 03/09/2016 1400  ? [place] NEGATIVE 02/10/2019 1223  ? PROTEINUR 100 (A) 02/10/2019 1223  ? UROBILINOGEN 0.2 03/11/2018 0958  ? UROBILINOGEN 0.2 03/09/2016 1400  ? NITRITE NEGATIVE 02/10/2019 1223  ? LEUKOCYTESUR NEGATIVE 02/10/2019 1223  ? LEUKOCYTESUR Negative 03/09/2016 1400  ? ? ?Sepsis Labs: ?Lactic Acid, Venous ?   ?Component Value Date/Time  ? LATICACIDVEN 2.0 ([place]) 04/07/2021 1227  ? ? ?MICROBIOLOGY: ?Recent Results (from the past 240 hour(s))  ?Resp Panel by RT-PCR (Flu A&B, Covid) Nasopharyngeal Swab     Status: None  ? Collection Time: 04/07/21 11:06 AM  ? Specimen: Nasopharyngeal Swab; Nasopharyngeal(NP) swabs in vial transport medium  ?Result  Value Ref Range Status  ? SARS Coronavirus 2 by RT PCR NEGATIVE NEGATIVE Final  ?  Comment: (NOTE) ?SARS-CoV-2 target nucleic acids are NOT DETECTED. ? ?The SARS-CoV-2 RNA is generally detectable in upper respiratory ?specimens

## 2021-04-12 NOTE — Progress Notes (Signed)
Subjective: No events overnight. ? ?ROS: negative except above ? ?Examination ? ?Vital signs in last 24 hours: ?Temp:  [97.3 ?F (36.3 ?C)-98.5 ?F (36.9 ?C)] 97.9 ?F (36.6 ?C) (03/15 0753) ?Pulse Rate:  [71-93] 81 (03/15 0753) ?Resp:  [12-23] 12 (03/15 0753) ?BP: (124-187)/(58-72) 144/65 (03/15 0753) ?SpO2:  [90 %-100 %] 98 % (03/15 0753) ?Weight:  [60.5 kg] 60.5 kg (03/14 1610) ? ?General: lying in bed, NAD ?CVS: pulse-normal rate and rhythm ?RS: breathing comfortably ?Extremities: normal  ?Neuro: AOx3, blind, rest of cranial nerve grossly intact, 5/5 in all extremities except 4/5 in RLE ? ?Basic Metabolic Panel: ?Recent Labs  ?Lab 04/07/21 ?1040 04/08/21 ?0718 04/09/21 ?5053 04/10/21 ?0139 04/11/21 ?0142  ?NA 136 137 136 141 139  ?K 3.2* 3.8 4.0 4.4 4.5  ?CL 94* 103 102 108 110  ?CO2 '30 24 26 24 '$ 20*  ?GLUCOSE 200* 91 111* 109* 101*  ?BUN '11 17 13 '$ 29* 34*  ?CREATININE 4.19* 4.68* 1.95* 3.97* 5.00*  ?CALCIUM 9.5 8.9 8.1* 9.1 9.5  ?PHOS  --  4.9*  --   --   --   ? ? ?CBC: ?Recent Labs  ?Lab 04/07/21 ?1040 04/08/21 ?0718 04/09/21 ?9767 04/10/21 ?0139 04/11/21 ?0142 04/12/21 ?3419  ?WBC 8.5 7.2 9.8 10.3 9.7 8.7  ?NEUTROABS 6.5  --  7.4  --   --   --   ?HGB 10.3* 8.7* 5.5* 8.8* 8.7* 9.2*  ?HCT 32.3* 26.9* 16.8* 25.8* 27.1* 27.6*  ?MCV 85.2 83.8 82.0 86.6 88.9 86.8  ?PLT 235 213 184 163 150 154  ? ? ? ?Coagulation Studies: ?No results for input(s): LABPROT, INR in the last 72 hours. ? ?Imaging ?No new brain imaging overnight ?  ?ASSESSMENT AND PLAN: tonic-clonic seizure-like activity with worsening frequency despite increasing lamotrigine. ?  ?Epilepsy with breakthrough seizure ?-No ictal-interictal activity overnight. ?  ?Recommendations ?-Continue video EEG for characterization of spells.  If no seizures by tomorrow, plan to discontinue LTM EEG ?- We will resume lamotrigine tonight.  Patient has been off of lamotrigine for less than 5 days, therefore planning to resume at home dose. ?-Continue seizure precautions ?- PRN  IV Ativan 2 mg for seizure-like activity ?-Management of rest of comorbidities per primary team ?  ?I have spent a total of  25 minutes with the patient reviewing hospital notes,  test results, labs and examining the patient as well as establishing an assessment and plan that was discussed personally with the patient and daughter at bedside.  > 50% of time was spent in direct patient care. ? ?Zeb Comfort ?Epilepsy ?[facility] ?For questions after 5pm please refer to AMION to reach the Neurologist on call ? ?

## 2021-04-12 NOTE — TOC Initial Note (Signed)
Transition of Care (TOC) - Initial/Assessment Note  ? ? ?Patient Details  ?Name: Julie Brewer ?MRN: [id number] ?Date of Birth: April 06, 1950 ? ?Transition of Care (TOC) CM/SW Contact:    ?Cyndi Bender, RN ?Phone Number: ?04/12/2021, 2:35 PM ? ?Clinical Narrative:            ?Spoke to patient regarding transition. ?Patient lives with daughter. Patient has all needed DME. ?Daughter will transport home once medically ready      ? ? ?Expected Discharge Plan: Home/Self Care ?Barriers to Discharge: Continued Medical Work up ? ? ?Patient Goals and CMS Choice ?Patient states their goals for this hospitalization and ongoing recovery are:: return home ?  ?  ? ?Expected Discharge Plan and Services ?Expected Discharge Plan: Home/Self Care ?  ?  ?  ?Living arrangements for the past 2 months: [place] ?                ?  ?  ?  ?  ?  ?  ?  ?  ?  ?  ? ?Prior Living Arrangements/Services ?Living arrangements for the past 2 months: [place] ?Lives with:: Adult Children ?  ?Do you feel safe going back to the place where you live?: Yes      ?Need for Family Participation in Patient Care: Yes (Comment) ?Care giver support system in place?: Yes (comment) ?  ?Criminal Activity/Legal Involvement Pertinent to Current Situation/Hospitalization: No - Comment as needed ? ?Activities of Daily Living ?Home Assistive Devices/Equipment: Dentures (specify type), Shower chair with back (cane for the blind) ?ADL Screening (condition at time of admission) ?Patient's cognitive ability adequate to safely complete daily activities?: Yes ?Is the patient deaf or have difficulty hearing?: No ?Does the patient have difficulty seeing, even when wearing glasses/contacts?: Yes ?Does the patient have difficulty concentrating, remembering, or making decisions?: No ?Patient able to express need for assistance with ADLs?: Yes ?Does the patient have difficulty dressing or bathing?: Yes ?Independently performs ADLs?: Yes (appropriate for  developmental age) ?Does the patient have difficulty walking or climbing stairs?: Yes ?Weakness of Legs: Both ?Weakness of Arms/Hands: None ? ?Permission Sought/Granted ?Permission sought to share information with : Case Manager ?  ?   ?   ?   ?   ? ?Emotional Assessment ?Appearance:: Appears stated age ?Attitude/Demeanor/Rapport: Engaged ?Affect (typically observed): Accepting ?Orientation: : Oriented to Self, Oriented to Place, Oriented to  Time, Oriented to Situation ?Alcohol / Substance Use: Not Applicable ?Psych Involvement: No (comment) ? ?Admission diagnosis:  Seizure ([place]) [R56.9] ?Patient Active Problem List  ? Diagnosis Date Noted  ? Seizure ([place]) 04/07/2021  ? Hypokalemia 05/11/2019  ? Mild protein-calorie malnutrition ([place]) 05/07/2019  ? Anemia in chronic kidney disease 03/18/2019  ? Coagulation defect, unspecified ([place]) 03/18/2019  ? COVID-19 03/18/2019  ? Diarrhea, unspecified 03/18/2019  ? Encounter for fitting and adjustment of extracorporeal dialysis catheter ([place]) 03/18/2019  ? End stage renal disease ([place]) 03/18/2019  ? Hyperlipidemia, unspecified 03/18/2019  ? Pain, unspecified 03/18/2019  ? 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 Pruritus, unspecified 03/18/2019  ? Shortness of breath 03/18/2019  ? Unspecified diastolic (congestive) heart failure ([place]) 03/18/2019  ? Syncope 02/06/2019  ? AKI (acute kidney injury) ([place]) 06/20/2018  ? Acute exacerbation of CHF (congestive heart failure) ([place]) 06/20/2018  ? Acute respiratory failure with hypoxia ([place]) 06/20/2018  ? Tobacco abuse 06/20/2018  ? GERD (gastroesophageal reflux disease) 06/08/2017  ? Guaiac positive stools 06/08/2017  ? CRI (chronic renal  insufficiency), stage 3 (moderate) ([place]) 05/01/2016  ? History of TIA (transient ischemic attack) 05/01/2016  ? Blind 05/01/2016  ? Iron deficiency anemia 03/09/2016  ? Syncope and collapse 01/16/2016  ? Dysphagia, pharyngoesophageal phase 05/05/2014  ? Insulin dependent  diabetes mellitus with complications ([place]) 27/25/3664  ? Essential hypertension 10/06/2012  ? Dyslipidemia 09/22/2012  ? Peripheral vascular disease ([place]) 09/01/2012  ? ?PCP:  Glenis Smoker, MD ?Pharmacy:   ?[facility] ([place] [place] - [address] ?[address] ?[place] [address] ?Phone: [phone] Fax: [phone] ? ? ? ? ?Social Determinants of Health (SDOH) Interventions ?  ? ?Readmission Risk Interventions ?Readmission Risk Prevention Plan 02/18/2019  ?Transportation Screening Complete  ?Palliative Care Screening Not Applicable  ?Some recent data might be hidden  ? ? ? ?

## 2021-04-12 NOTE — Care Management Important Message (Signed)
Important Message ? ?Patient Details  ?Name: Julie Brewer ?MRN: [id number] ?Date of Birth: 1950/07/10 ? ? ?Medicare Important Message Given:  Yes ? ? ? ? ?Julie Brewer ?04/12/2021, 1:44 PM ?

## 2021-04-12 NOTE — Plan of Care (Signed)
Pt alert and oriented x 4. Med compliant except for prn nicotine. Vitals stable. Pt slept majority of the night.  ?Problem: Education: ?Goal: Knowledge of General Education information will improve ?Description: Including pain rating scale, medication(s)/side effects and non-pharmacologic comfort measures ?Outcome: Progressing ?  ?Problem: Health Behavior/Discharge Planning: ?Goal: Ability to manage health-related needs will improve ?Outcome: Progressing ?  ?Problem: Clinical Measurements: ?Goal: Ability to maintain clinical measurements within normal limits will improve ?Outcome: Progressing ?Goal: Will remain free from infection ?Outcome: Progressing ?Goal: Diagnostic test results will improve ?Outcome: Progressing ?Goal: Respiratory complications will improve ?Outcome: Progressing ?Goal: Cardiovascular complication will be avoided ?Outcome: Progressing ?  ?Problem: Activity: ?Goal: Risk for activity intolerance will decrease ?Outcome: Progressing ?  ?Problem: Nutrition: ?Goal: Adequate nutrition will be maintained ?Outcome: Progressing ?  ?Problem: Coping: ?Goal: Level of anxiety will decrease ?Outcome: Progressing ?  ?Problem: Elimination: ?Goal: Will not experience complications related to bowel motility ?Outcome: Progressing ?Goal: Will not experience complications related to urinary retention ?Outcome: Progressing ?  ?Problem: Pain Managment: ?Goal: General experience of comfort will improve ?Outcome: Progressing ?  ?Problem: Safety: ?Goal: Ability to remain free from injury will improve ?Outcome: Progressing ?  ?Problem: Skin Integrity: ?Goal: Risk for impaired skin integrity will decrease ?Outcome: Progressing ?  ?Problem: Medication: ?Goal: Risk for medication side effects will decrease ?Outcome: Progressing ?  ?

## 2021-04-13 ENCOUNTER — Other Ambulatory Visit (HOSPITAL_COMMUNITY): Payer: Self-pay

## 2021-04-13 DIAGNOSIS — Z794 Long term (current) use of insulin: Secondary | ICD-10-CM

## 2021-04-13 DIAGNOSIS — G40909 Epilepsy, unspecified, not intractable, without status epilepticus: Secondary | ICD-10-CM

## 2021-04-13 DIAGNOSIS — E1165 Type 2 diabetes mellitus with hyperglycemia: Secondary | ICD-10-CM

## 2021-04-13 LAB — GLUCOSE, CAPILLARY
Glucose-Capillary: 106 mg/dL — ABNORMAL HIGH (ref 70–99)
Glucose-Capillary: 126 mg/dL — ABNORMAL HIGH (ref 70–99)
Glucose-Capillary: 99 mg/dL (ref 70–99)

## 2021-04-13 LAB — CBC
HCT: 28.8 % — ABNORMAL LOW (ref 36.0–46.0)
Hemoglobin: 9.3 g/dL — ABNORMAL LOW (ref 12.0–15.0)
MCH: 28.7 pg (ref 26.0–34.0)
MCHC: 32.3 g/dL (ref 30.0–36.0)
MCV: 88.9 fL (ref 80.0–100.0)
Platelets: 173 10*3/uL (ref 150–400)
RBC: 3.24 MIL/uL — ABNORMAL LOW (ref 3.87–5.11)
RDW: 16.5 % — ABNORMAL HIGH (ref 11.5–15.5)
WBC: 8.6 10*3/uL (ref 4.0–10.5)
nRBC: 0 % (ref 0.0–0.2)

## 2021-04-13 MED ORDER — PANTOPRAZOLE SODIUM 40 MG PO TBEC
DELAYED_RELEASE_TABLET | ORAL | 3 refills | Status: AC
  Filled 2021-04-13: qty 60, 30d supply, fill #0

## 2021-04-13 MED ORDER — ASPIRIN 325 MG PO TBEC: 325.0000 mg | DELAYED_RELEASE_TABLET | Freq: Every day | ORAL | Status: AC

## 2021-04-13 MED ORDER — DARBEPOETIN ALFA 40 MCG/0.4ML IJ SOSY
40.0000 ug | PREFILLED_SYRINGE | INTRAMUSCULAR | Status: DC
  Administered 2021-04-13: 40 ug via INTRAVENOUS
  Filled 2021-04-13 (×2): qty 0.4

## 2021-04-13 MED ORDER — PENTAFLUOROPROP-TETRAFLUOROETH EX AERO
INHALATION_SPRAY | CUTANEOUS | Status: AC
  Filled 2021-04-13: qty 103.5

## 2021-04-13 MED ORDER — INSULIN GLARGINE 100 UNIT/ML ~~LOC~~ SOLN: 12.0000 [IU] | Freq: Every day | SUBCUTANEOUS | Status: DC

## 2021-04-13 NOTE — Discharge Summary (Addendum)
? ?PATIENT DETAILS ?Name: Julie Brewer ?Age: 71 y.o. ?Sex: female ?Date of Birth: 1950/04/22 ?MRN: [id number]. ?Admitting Physician: Jonetta Osgood, MD ?EXB:[id number], Anastasia Pall, MD ? ?Admit Date: 04/07/2021 ?Discharge date: 04/13/2021 ? ?Recommendations for Outpatient Follow-up:  ?Follow up with PCP in 1-2 weeks ?Please obtain CMP/CBC in one week ?Please ensure outpatient follow-up with patient's primary neurologist, and primary nephrologist. ?Resume usual outpatient HD. ? ?Admitted From:  ?Home ? ?Disposition: ?Home ?  ?Discharge Condition: ?good ? ?CODE STATUS: ?  Code Status: DNR  ? ?Diet recommendation:  ?Diet Order   ? ?       ?  Diet - low sodium heart healthy       ?  ?  Diet renal with fluid restriction Fluid restriction: 1200 mL Fluid; Room service appropriate? Yes; Fluid consistency: Thin  Diet effective now       ?  ? ?  ?  ? ?  ?  ? ?Brief Summary: ?Patient is a 71 y.o.  female with history of ESRD on HD, seizure disorder, HFpEF, DM-2, HTN, legally blind-who presented to the hospital with breakthrough seizures.  Apparently patient has had several medication adjustment in the outpatient setting-with increasing frequency of seizures.  Evaluated by neurology-with plans to to continue prolonged LTM EEG for several days.  Hospital course complicated by upper GI bleeding requiring PRBC transfusion and EGD.  See below for further details.   ?  ?Significant events: ?3/10>> admit to [facility] for breakthrough seizures ?3/12>> melena, 2u prbc transfused. GI consulted  ?  ?Significant studies: ?3/10>> CT head: No acute intracranial abnormality. ?  ?Significant microbiology data: ?3/10>> COVID/influenza PCR: Negative ?  ?Procedures: ?3/13>> EGD showed mild LA grade a esophagitis at GE junction, hemorrhagic mucosa in the gastric cardia treated with APC, gastritis and small nonbleeding ulcer in the second portion of the duodenum. ?  ?Consults: ?Neurology, nephrology ? ?Brief Hospital Course: ?Breakthrough  seizures: Evaluated by neurology-underwent LTM EEG monitoring for several days-no seizure activity was detected.  Discussed with neurologist-Dr. Renold Genta to discharge after dialysis today-resume usual dosing of lamotrigine.  Patient will need follow-up with her primary neurologist for further continued care. Patient aware of driving restriction and seizure precautions-see instructions below. ?  ?Upper GI bleeding with acute blood loss anemia: Seems to have resolved-hemoglobin stable-follow closely-on PPI times twice daily for 4 weeks, then switch to daily dosing.  GI has signed off. ?  ?ESRD on HD TTS: Nephrology followed closely during this hospital stay-nephrology aware of plans for discharge. ?  ?Chronic HFpEF: Volume status stable-fluid removal with HD. ?  ?HTN: BP stable-continue Norvasc ? ?HLD: Continue statin ? ?PAD: Antiplatelets on hold due to upper GI bleeding.  Discussed with GI MD Dr. Alessandra Bevels today-okay to resume antiplatelets 5 days from EGD. ? ?DM-2 (A1c 6.1 on 3/10): CBGs relatively stable-resume prior dosing of Lantus/NovoLog on discharge ? ?Legally blind ?  ?BMI: ?Estimated body mass index is 23.63 kg/m? as calculated from the following: ?  Height as of this encounter: '5\' 3"'$  (1.6 m). ?  Weight as of this encounter: 60.5 kg.  ?  ? ?Discharge Diagnoses:  ?Principal Problem: ?  Seizure ([place]) ? ? ?Discharge Instructions: ? ?Activity:  ?As tolerated with Full fall precautions use walker/cane & assistance as needed ? ?Discharge Instructions   ? ? Diet - low sodium heart healthy   Complete by: As directed ?  ? Discharge instructions   Complete by: As directed ?  ? Follow with Primary MD  Glenis Smoker, MD in 1-2 weeks ? ?Resume aspirin from 3/18. ? ?Follow-up with your primary neurologist in the next 1-2 weeks. ? ?Please get a complete blood count and chemistry panel checked by your Primary MD at your next visit, and again as instructed by your Primary MD. ? ?Get Medicines reviewed and  adjusted: ?Please take all your medications with you for your next visit with your Primary MD ? ?Laboratory/radiological data: ?Please request your Primary MD to go over all hospital tests and procedure/radiological results at the follow up, please ask your Primary MD to get all Hospital records sent to his/her office. ? ?In some cases, they will be blood work, cultures and biopsy results pending at the time of your discharge. Please request that your primary care M.D. follows up on these results. ? ?Also Note the following: ?If you experience worsening of your admission symptoms, develop shortness of breath, life threatening emergency, suicidal or homicidal thoughts you must seek medical attention immediately by calling 911 or calling your MD immediately  if symptoms less severe. ? ?You must read complete instructions/literature along with all the possible adverse reactions/side effects for all the Medicines you take and that have been prescribed to you. Take any new Medicines after you have completely understood and accpet all the possible adverse reactions/side effects.  ? ?Do not drive when taking Pain medications or sleeping medications (Benzodaizepines) ? ?Do not take more than prescribed Pain, Sleep and Anxiety Medications. It is not advisable to combine anxiety,sleep and pain medications without talking with your primary care practitioner ? ?Special Instructions: If you have smoked or chewed Tobacco  in the last 2 yrs please stop smoking, stop any regular Alcohol  and or any Recreational drug use. ? ?Wear Seat belts while driving. ? ?Please note: ?You were cared for by a hospitalist during your hospital stay. Once you are discharged, your primary care physician will handle any further medical issues. Please note that NO REFILLS for any discharge medications will be authorized once you are discharged, as it is imperative that you return to your primary care physician (or establish a relationship with a  primary care physician if you do not have one) for your post hospital discharge needs so that they can reassess your need for medications and monitor your lab values.  ? Seizure precautions: ?Per [facility] statutes, patients with seizures are not allowed to drive until they have been seizure-free for six months and cleared by a physician  ?  ?Use caution when using heavy equipment or power tools. Avoid working on ladders or at heights. Take showers instead of baths. Ensure the water temperature is not too high on the home water heater. Do not go swimming alone. Do not lock yourself in a room alone (i.e. bathroom). When caring for infants or small children, sit down when holding, feeding, or changing them to minimize risk of injury to the child in the event you have a seizure. Maintain good sleep hygiene. Avoid alcohol.  ?  ?If patient has another seizure, call 911 and bring them back to the ED if: ?A.  The seizure lasts longer than 5 minutes.      ?B.  The patient doesn't wake shortly after the seizure or has new problems such as difficulty seeing, speaking or moving following the seizure ?C.  The patient was injured during the seizure ?D.  The patient has a temperature over 102 F (39C) ?E.  The patient vomited during the seizure and now  is having trouble breathing ?   ?During the Seizure ?  ?- First, ensure adequate ventilation and place patients on the floor on their left side  ?Loosen clothing around the neck and ensure the airway is patent. If the patient is clenching the teeth, do not force the mouth open with any object as this can cause severe damage ?- Remove all items from the surrounding that can be hazardous. The patient may be oblivious to what's happening and may not even know what he or she is doing. ?If the patient is confused and wandering, either gently guide him/her away and block access to outside areas ?- Reassure the individual and be comforting ?- Call 911. In most cases, the seizure  ends before EMS arrives. However, there are cases when seizures may last over 3 to 5 minutes. Or the individual may have developed breathing difficulties or severe injuries. If a pregnant patient or a person with diabetes

## 2021-04-13 NOTE — Progress Notes (Addendum)
[facility] ?Progress Note ? ?  ?OP HD: [facility] HD 3/09  ? 3h  400/1.5   59.6kg   2/2 bath   LUA AVG  Heparin 4500 ? - hect 10 ug IV tiw ? - mircera 30 mcg q4, last 3/02, due 3/16 ?  ?  ?Assessment/ Plan: ?Seizure episodes - per pmd and neurology. Is on continuous EEG monitoring.  ?GI bleed - bloody stools and Hb down to 5- 6 range -> 2u prbcs. EGD gastric AVM APC, small nonbleeding ulcer in 2nd portion of duodenum.  ?ESRD - on HD TTS.  HD on 3/14 2L net UF. Next HD today given that neurology suspects HD may be causing the seizures. ?Hypokalemia - better, 4.5 3/14.  ?Volume - no vol overload by exam, at dry wt ?HTN - hold norvasc w/ GIB ?DM2 - on insulin per pmd ?Anemia ckd - next ESA due on 3/16. Hb down w/ ABL -> now up to  9.2 and fairly stable. 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 Cont vdra.  ?  ?  ? ?Subjective: Pt seen in room; feeling better. Still no more seizures since admission here; EEG ongoing.  ?  ?Denies f/c/n/v/dyspnea. ? ?Vitals:  ? 04/12/21 1700 04/12/21 1944 04/13/21 0017 04/13/21 0350  ?BP: (!) 150/59 (!) 166/65 (!) 144/67 125/67  ?Pulse: 71     ?Resp: 17     ?Temp: 97.7 ?F (36.5 ?C) 97.9 ?F (36.6 ?C) 98 ?F (36.7 ?C) 97.9 ?F (36.6 ?C)  ?TempSrc: Oral Oral Oral Oral  ?SpO2: 99%     ?Weight:      ?Height:      ? ? ?Exam: ?Gen alert, no distress ?Sclera anicteric, throat clear  ?No jvd or bruits ?Chest clear bilat to bases ?RRR no RG ?Abd soft ntnd no mass or ascites +bs ?Ext no LE or UE edema ?Neuro is alert, Ox 3 , nf ?  LUA AVG+bruit ?  ?  ? Home meds include - norvasc 5, lipitor, asa, pletal, neurontin 100 bid, insulin lantus/ regular, lamcital, prilosec, prns/ vits / supps ? ? ?Recent Labs  ?Lab 04/08/21 ?0718 04/09/21 ?6295 04/10/21 ?0139 04/11/21 ?0142 04/12/21 ?2841 04/13/21 ?0201  ?HGB 8.7* 5.5* 8.8* 8.7* 9.2* 9.3*  ?ALBUMIN 2.8* 2.5*  --   --   --   --   ?CALCIUM 8.9 8.1* 9.1 9.5  --   --   ?PHOS 4.9*  --   --   --   --   --   ?CREATININE 4.68* 1.95* 3.97* 5.00*  --   --    ?K 3.8 4.0 4.4 4.5  --   --   ? ?Inpatient medications: ? amLODipine  10 mg Oral Daily  ? atorvastatin  20 mg Oral Daily  ? chlorhexidine gluconate (MEDLINE KIT)  15 mL Mouth Rinse BID  ? Chlorhexidine Gluconate Cloth  6 each Topical Daily  ? Chlorhexidine Gluconate Cloth  6 each Topical Q0600  ? darbepoetin (ARANESP) injection - DIALYSIS  40 mcg Intravenous Q Wed-HD  ? docusate sodium  200 mg Oral Daily  ? doxercalciferol  10 mcg Intravenous Q T,Th,Sa-HD  ? feeding supplement (NEPRO CARB STEADY)  237 mL Oral BID BM  ? ferrous sulfate  650 mg Oral Daily  ? insulin aspart  0-5 Units Subcutaneous QHS  ? insulin aspart  0-6 Units Subcutaneous TID WC  ? insulin glargine-yfgn  5 Units Subcutaneous Daily  ? lamoTRIgine  200 mg Oral BID  ? mouth  rinse  15 mL Mouth Rinse Q4H  ? multivitamin  1 tablet Oral QHS  ? pantoprazole  40 mg Oral BID  ? ? ?acetaminophen, bisacodyl, hydrALAZINE, LORazepam, nicotine polacrilex ? ? ? ? ? ? ?

## 2021-04-13 NOTE — Procedures (Addendum)
MRN: [id number]  ?Epilepsy Attending: Lora Havens  ?Referring Physician/Provider: Amie Portland, MD ?Duration:  04/12/2021 1628 to 04/13/2021 1714 ?  ?Patient history:  70 y.o. female with past medical history of ESRD (T/TH/S), CHF, DM, HTN, HLD, seizures, prior stroke, PVD, and blindness who presents to the [facility] for evaluation of seizure this am. Home Lamictal dose was increased to '200mg'$  BID this past Friday due to increasing frequency of seizures despite compliance to medications.  Seizures usually happen around the time dialysis. EEG to evaluate for seizure ?  ?Level of alertness: Awake, asleep ?  ?AEDs during EEG study: LTG ?  ?Technical aspects: This EEG study was done with scalp electrodes positioned according to the 10-20 International system of electrode placement. Electrical activity was acquired at a sampling rate of '500Hz'$  and reviewed with a high frequency filter of '70Hz'$  and a low frequency filter of '1Hz'$ . EEG data were recorded continuously and digitally stored.  ?  ?Description: The posterior dominant rhythm consists of 8 Hz activity of moderate voltage (25-35 uV) seen predominantly in posterior head regions, symmetric and reactive to eye opening and eye closing. Sleep was characterized by vertex waves, sleep spindles (12 to 14 Hz), maximal frontocentral region. Hyperventilation and photic stimulation were not performed.    ?  ?IMPRESSION: ?This study is within normal limits. No seizures or epileptiform discharges were seen throughout the recording. ?  ?Lora Havens  ?

## 2021-04-13 NOTE — Progress Notes (Signed)
Called patient's daughter Julie Brewer and informed that patient is ready for discharge.  Discharge instructions given to patient.  Encouraged to call the doctor for questions.  Verbalized understanding.  Discharged home. ?

## 2021-04-13 NOTE — Progress Notes (Signed)
Discharge order has been in place.  Discharge pending, HD in progresss.  After HD, pt will come off EEG monitoring. ?

## 2021-04-13 NOTE — Progress Notes (Addendum)
D/C order noted. Attempt was made to avoid HD on day of d/c but neurology is requesting that pt receive HD with EEG monitoring prior to d/c per RN. Pt to receive HD prior to d/c. Spoke to pt's dtr to make her aware that pt will receive HD today and will need to resume at clinic on Saturday. Case discussed with pt's RN, nephrologist and inpt HD unit. Contacted [facility] to make clinic aware of pt's d/c today and pt to resume care on Saturday. ? ?Melven Sartorius ?Renal Navigator ?[phone] ?

## 2021-04-13 NOTE — Progress Notes (Signed)
Discontinued vLTM  [facility] notified.  No skin breakdown noted at all skin sites.  ?

## 2021-04-13 NOTE — Progress Notes (Signed)
Subjective: No events overnight ? ?ROS: negative except above ? ?Examination ? ?Vital signs in last 24 hours: ?Temp:  [97.6 ?F (36.4 ?C)-98 ?F (36.7 ?C)] 98 ?F (36.7 ?C) (03/16 0800) ?Pulse Rate:  [66-80] 66 (03/16 0800) ?Resp:  [11-17] 11 (03/16 0800) ?BP: (125-166)/(54-67) 153/54 (03/16 0800) ?SpO2:  [96 %-99 %] 97 % (03/16 0800) ? ?General: lying in bed, NAD ?CVS: pulse-normal rate and rhythm ?RS: breathing comfortably ?Extremities: normal  ?Neuro: AOx3, blind, rest of cranial nerve grossly intact, 5/5 in all extremities except 4/5 in RLE ? ?Basic Metabolic Panel: ?Recent Labs  ?Lab 04/07/21 ?1040 04/08/21 ?0718 04/09/21 ?9390 04/10/21 ?0139 04/11/21 ?0142  ?NA 136 137 136 141 139  ?K 3.2* 3.8 4.0 4.4 4.5  ?CL 94* 103 102 108 110  ?CO2 '30 24 26 24 '$ 20*  ?GLUCOSE 200* 91 111* 109* 101*  ?BUN '11 17 13 '$ 29* 34*  ?CREATININE 4.19* 4.68* 1.95* 3.97* 5.00*  ?CALCIUM 9.5 8.9 8.1* 9.1 9.5  ?PHOS  --  4.9*  --   --   --   ? ? ?CBC: ?Recent Labs  ?Lab 04/07/21 ?1040 04/08/21 ?0718 04/09/21 ?3009 04/10/21 ?0139 04/11/21 ?0142 04/12/21 ?2330 04/13/21 ?0201  ?WBC 8.5   < > 9.8 10.3 9.7 8.7 8.6  ?NEUTROABS 6.5  --  7.4  --   --   --   --   ?HGB 10.3*   < > 5.5* 8.8* 8.7* 9.2* 9.3*  ?HCT 32.3*   < > 16.8* 25.8* 27.1* 27.6* 28.8*  ?MCV 85.2   < > 82.0 86.6 88.9 86.8 88.9  ?PLT 235   < > 184 163 150 154 173  ? < > = values in this interval not displayed.  ? ? ? ?Coagulation Studies: ?No results for input(s): LABPROT, INR in the last 72 hours. ? ?Imaging ?No new brain imaging overnight ?  ?ASSESSMENT AND PLAN: tonic-clonic seizure-like activity with worsening frequency despite increasing lamotrigine. ?  ?Convulsions ?-No ictal-interictal activity overnight. ?-Patient has been on video EEG for 6 days without receiving lamotrigine.  Her EEG during this.  Was within normal limits.  Unfortunately no events were recorded.  She has also had prior ambulatory EEG which was within normal limits.   ?-Differentials include nonepileptic events  (daughter states most of these episodes happen when patient is smoking) versus hemodynamic instability due to dialysis versus less likely epilepsy ? ?Recommendations ?-DC LTM EEG ?-Continue lamotrigine at home dose ?-Continue seizure precautions ?- PRN IV Ativan 2 mg for seizure-like activity ?-Follow-up with Dr. Delice Lesch on 4/17 at 76 ?-Discussed plan in detail with patient and her daughter ? ?Seizure precautions: ?Per [facility] statutes, patients with seizures are not allowed to drive until they have been seizure-free for six months and cleared by a physician  ?  ?Use caution when using heavy equipment or power tools. Avoid working on ladders or at heights. Take showers instead of baths. Ensure the water temperature is not too high on the home water heater. Do not go swimming alone. Do not lock yourself in a room alone (i.e. bathroom). When caring for infants or small children, sit down when holding, feeding, or changing them to minimize risk of injury to the child in the event you have a seizure. Maintain good sleep hygiene. Avoid alcohol.  ?  ?If patient has another seizure, call 911 and bring them back to the ED if: ?A.  The seizure lasts longer than 5 minutes.      ?B.  The  patient doesn't wake shortly after the seizure or has new problems such as difficulty seeing, speaking or moving following the seizure ?C.  The patient was injured during the seizure ?D.  The patient has a temperature over 102 F (39C) ?E.  The patient vomited during the seizure and now is having trouble breathing ?   ?During the Seizure ?  ?- First, ensure adequate ventilation and place patients on the floor on their left side  ?Loosen clothing around the neck and ensure the airway is patent. If the patient is clenching the teeth, do not force the mouth open with any object as this can cause severe damage ?- Remove all items from the surrounding that can be hazardous. The patient may be oblivious to what's happening and may not  even know what he or she is doing. ?If the patient is confused and wandering, either gently guide him/her away and block access to outside areas ?- Reassure the individual and be comforting ?- Call 911. In most cases, the seizure ends before EMS arrives. However, there are cases when seizures may last over 3 to 5 minutes. Or the individual may have developed breathing difficulties or severe injuries. If a pregnant patient or a person with diabetes develops a seizure, it is prudent to call an ambulance. ?- Finally, if the patient does not regain full consciousness, then call EMS. Most patients will remain confused for about 45 to 90 minutes after a seizure, so you must use judgment in calling for help. ?  ? After the Seizure (Postictal Stage) ?  ?After a seizure, most patients experience confusion, fatigue, muscle pain and/or a headache. Thus, one should permit the individual to sleep. For the next few days, reassurance is essential. Being calm and helping reorient the person is also of importance. ?  ?Most seizures are painless and end spontaneously. Seizures are not harmful to others but can lead to complications such as stress on the lungs, brain and the heart. Individuals with prior lung problems may develop labored breathing and respiratory distress.  ? ? ?  ? ?  ?I have spent a total of 35 minutes with the patient reviewing hospital notes,  test results, labs and examining the patient as well as establishing an assessment and plan that was discussed personally with the patient and daughter at bedside.  > 50% of time was spent in direct patient care. ?  ? ? ?Zeb Comfort ?Epilepsy ?[facility] ?For questions after 5pm please refer to AMION to reach the Neurologist on call ? ?

## 2021-04-14 DIAGNOSIS — E1122 Type 2 diabetes mellitus with diabetic chronic kidney disease: Secondary | ICD-10-CM | POA: Diagnosis not present

## 2021-04-14 DIAGNOSIS — Z992 Dependence on renal dialysis: Secondary | ICD-10-CM | POA: Diagnosis not present

## 2021-04-14 DIAGNOSIS — N186 End stage renal disease: Secondary | ICD-10-CM | POA: Diagnosis not present

## 2021-04-15 DIAGNOSIS — N186 End stage renal disease: Secondary | ICD-10-CM | POA: Diagnosis not present

## 2021-04-15 DIAGNOSIS — N2581 Secondary hyperparathyroidism of renal origin: Secondary | ICD-10-CM | POA: Diagnosis not present

## 2021-04-15 DIAGNOSIS — Z992 Dependence on renal dialysis: Secondary | ICD-10-CM | POA: Diagnosis not present

## 2021-04-15 NOTE — TOC Transition Note (Signed)
Transition of care contact from inpatient facility ? ?Date of discharge: 04/13/21 ?Date of contact: 04/15/21 ?Method: Attempted Phone Call ?Spoke to: No Answer ? ?Contacted patient to discuss transition of care from recent inpatient hospitalization but patient did not pick up the phone. A voicemail was left to call back to [facility] HD unit [phone] for any questions of concerns. ? ?Patient will follow up with her outpatient HD unit on: 04/15/21 at [facility]. ? ?Tobie Poet, NP ?  ?

## 2021-04-18 ENCOUNTER — Other Ambulatory Visit: Payer: Self-pay | Admitting: Endocrinology

## 2021-04-18 DIAGNOSIS — N186 End stage renal disease: Secondary | ICD-10-CM | POA: Diagnosis not present

## 2021-04-18 DIAGNOSIS — N2581 Secondary hyperparathyroidism of renal origin: Secondary | ICD-10-CM | POA: Diagnosis not present

## 2021-04-18 DIAGNOSIS — Z992 Dependence on renal dialysis: Secondary | ICD-10-CM | POA: Diagnosis not present

## 2021-04-19 ENCOUNTER — Ambulatory Visit (INDEPENDENT_AMBULATORY_CARE_PROVIDER_SITE_OTHER): Payer: Medicare Other | Admitting: Podiatry

## 2021-04-19 ENCOUNTER — Other Ambulatory Visit: Payer: Self-pay

## 2021-04-19 ENCOUNTER — Ambulatory Visit: Payer: Medicare Other | Admitting: Cardiovascular Disease

## 2021-04-19 DIAGNOSIS — M79675 Pain in left toe(s): Secondary | ICD-10-CM

## 2021-04-19 DIAGNOSIS — E1151 Type 2 diabetes mellitus with diabetic peripheral angiopathy without gangrene: Secondary | ICD-10-CM

## 2021-04-19 DIAGNOSIS — M79674 Pain in right toe(s): Secondary | ICD-10-CM

## 2021-04-19 DIAGNOSIS — B351 Tinea unguium: Secondary | ICD-10-CM | POA: Diagnosis not present

## 2021-04-20 DIAGNOSIS — Z992 Dependence on renal dialysis: Secondary | ICD-10-CM | POA: Diagnosis not present

## 2021-04-20 DIAGNOSIS — N186 End stage renal disease: Secondary | ICD-10-CM | POA: Diagnosis not present

## 2021-04-20 DIAGNOSIS — N2581 Secondary hyperparathyroidism of renal origin: Secondary | ICD-10-CM | POA: Diagnosis not present

## 2021-04-21 ENCOUNTER — Encounter: Payer: Self-pay | Admitting: Podiatry

## 2021-04-21 DIAGNOSIS — R296 Repeated falls: Secondary | ICD-10-CM | POA: Diagnosis not present

## 2021-04-21 DIAGNOSIS — R413 Other amnesia: Secondary | ICD-10-CM | POA: Diagnosis not present

## 2021-04-21 NOTE — Progress Notes (Signed)
?  Subjective:  ?Patient ID: Julie Brewer, female    DOB: 04-May-1950,  MRN: [id number] ? ?Chief Complaint  ?Patient presents with  ? Nail Problem  ?  Nail trim   ? ?71 y.o. female returns for the above complaint.  Patient is a type II diabetic whose A1c is 6.1.  Patient presents with thickened elongated mycotic toenails x10.  Patient states that she is not able to cut it herself.  She would like to bring [place] to debride them down.  She denies any other acute complaints. ? ?Objective:  ?There were no vitals filed for this visit. ?Podiatric Exam: ?Vascular: dorsalis pedis and posterior tibial pulses are palpable bilateral. Capillary return is immediate. Temperature gradient is WNL. Skin turgor WNL  ?Sensorium: Normal Semmes Weinstein monofilament test. Normal tactile sensation bilaterally. ?Nail Exam: Pt has thick disfigured discolored nails with subungual debris noted bilateral entire nail hallux through fifth toenails ?Ulcer Exam: There is no evidence of ulcer or pre-ulcerative changes or infection. ?Orthopedic Exam: Muscle tone and strength are WNL. No limitations in general ROM. No crepitus or effusions noted. HAV  B/L.  Hammer toes 2-5  B/L. ?Skin: No Porokeratosis. No infection or ulcers.  Submet 3 hyperkeratotic lesion/callus x2.  Pain on palpation. ? ?Assessment & Plan:  ?Patient was evaluated and treated and all questions answered. ? ?Hammertoe contractures bilateral two through five ?-I explained to patient the etiology of hammertoe contracture as well as treatment options were discussed. Given that patient has underlying diabetes with contractures of the hammertoes I believe patient will benefit from diabetic shoes to prevent ulceration leading to amputation. Patient agrees with the plan. ?-Patient is currently wearing diabetic shoes.  No acute complaints noted. ? ?Bilateral submet 3 hyperkeratotic lesion/callus x2 ?-Resolving ? ?Onychomycosis with pain  ?-Nails palliatively debrided as  below. ?-Educated on self-care ? ?Procedure: Nail Debridement ?Rationale: pain  ?Type of Debridement: manual, sharp debridement. ?Instrumentation: Nail nipper, rotary burr. ?Number of Nails: 10 ? ?Procedures and Treatment: Consent by patient was obtained for treatment procedures. The patient understood the discussion of treatment and procedures well. All questions were answered thoroughly reviewed. Debridement of mycotic and hypertrophic toenails, 1 through 5 bilateral and clearing of subungual debris. No ulceration, no infection noted.  ?Return Visit-Office Procedure: Patient instructed to return to the office for a follow up visit 3 months for continued evaluation and treatment. ? ?Boneta Lucks, DPM ?  ? ?No follow-ups on file. ? ?

## 2021-04-22 DIAGNOSIS — N186 End stage renal disease: Secondary | ICD-10-CM | POA: Diagnosis not present

## 2021-04-22 DIAGNOSIS — Z992 Dependence on renal dialysis: Secondary | ICD-10-CM | POA: Diagnosis not present

## 2021-04-22 DIAGNOSIS — N2581 Secondary hyperparathyroidism of renal origin: Secondary | ICD-10-CM | POA: Diagnosis not present

## 2021-04-24 ENCOUNTER — Observation Stay (HOSPITAL_COMMUNITY)
Admission: EM | Admit: 2021-04-24 | Discharge: 2021-04-25 | Disposition: A | Discharge status: Home / Self Care | Payer: Medicare Other | Attending: Family Medicine | Admitting: Family Medicine

## 2021-04-24 ENCOUNTER — Emergency Department (HOSPITAL_COMMUNITY): Payer: Medicare Other

## 2021-04-24 DIAGNOSIS — Z720 Tobacco use: Secondary | ICD-10-CM | POA: Diagnosis not present

## 2021-04-24 DIAGNOSIS — E119 Type 2 diabetes mellitus without complications: Secondary | ICD-10-CM

## 2021-04-24 DIAGNOSIS — R569 Unspecified convulsions: Secondary | ICD-10-CM | POA: Diagnosis not present

## 2021-04-24 DIAGNOSIS — Z20822 Contact with and (suspected) exposure to covid-19: Secondary | ICD-10-CM | POA: Insufficient documentation

## 2021-04-24 DIAGNOSIS — F17210 Nicotine dependence, cigarettes, uncomplicated: Secondary | ICD-10-CM | POA: Diagnosis not present

## 2021-04-24 DIAGNOSIS — R079 Chest pain, unspecified: Secondary | ICD-10-CM | POA: Diagnosis not present

## 2021-04-24 DIAGNOSIS — J432 Centrilobular emphysema: Secondary | ICD-10-CM | POA: Diagnosis not present

## 2021-04-24 DIAGNOSIS — E785 Hyperlipidemia, unspecified: Secondary | ICD-10-CM

## 2021-04-24 DIAGNOSIS — K219 Gastro-esophageal reflux disease without esophagitis: Secondary | ICD-10-CM

## 2021-04-24 DIAGNOSIS — R4182 Altered mental status, unspecified: Secondary | ICD-10-CM | POA: Diagnosis not present

## 2021-04-24 DIAGNOSIS — R0789 Other chest pain: Secondary | ICD-10-CM | POA: Diagnosis not present

## 2021-04-24 DIAGNOSIS — J9811 Atelectasis: Secondary | ICD-10-CM | POA: Diagnosis not present

## 2021-04-24 DIAGNOSIS — I10 Essential (primary) hypertension: Secondary | ICD-10-CM | POA: Diagnosis not present

## 2021-04-24 DIAGNOSIS — Z7982 Long term (current) use of aspirin: Secondary | ICD-10-CM | POA: Diagnosis not present

## 2021-04-24 DIAGNOSIS — D7389 Other diseases of spleen: Secondary | ICD-10-CM | POA: Diagnosis not present

## 2021-04-24 DIAGNOSIS — Z794 Long term (current) use of insulin: Secondary | ICD-10-CM | POA: Insufficient documentation

## 2021-04-24 DIAGNOSIS — D631 Anemia in chronic kidney disease: Secondary | ICD-10-CM | POA: Insufficient documentation

## 2021-04-24 DIAGNOSIS — R42 Dizziness and giddiness: Secondary | ICD-10-CM | POA: Diagnosis not present

## 2021-04-24 DIAGNOSIS — G40909 Epilepsy, unspecified, not intractable, without status epilepticus: Secondary | ICD-10-CM

## 2021-04-24 DIAGNOSIS — I132 Hypertensive heart and chronic kidney disease with heart failure and with stage 5 chronic kidney disease, or end stage renal disease: Secondary | ICD-10-CM | POA: Insufficient documentation

## 2021-04-24 DIAGNOSIS — I700 Atherosclerosis of aorta: Secondary | ICD-10-CM | POA: Diagnosis not present

## 2021-04-24 DIAGNOSIS — Z992 Dependence on renal dialysis: Secondary | ICD-10-CM | POA: Diagnosis not present

## 2021-04-24 DIAGNOSIS — K8020 Calculus of gallbladder without cholecystitis without obstruction: Secondary | ICD-10-CM | POA: Diagnosis not present

## 2021-04-24 DIAGNOSIS — E1122 Type 2 diabetes mellitus with diabetic chronic kidney disease: Secondary | ICD-10-CM | POA: Diagnosis not present

## 2021-04-24 DIAGNOSIS — I509 Heart failure, unspecified: Secondary | ICD-10-CM | POA: Diagnosis not present

## 2021-04-24 DIAGNOSIS — Z743 Need for continuous supervision: Secondary | ICD-10-CM | POA: Diagnosis not present

## 2021-04-24 DIAGNOSIS — Z79899 Other long term (current) drug therapy: Secondary | ICD-10-CM | POA: Insufficient documentation

## 2021-04-24 DIAGNOSIS — N186 End stage renal disease: Secondary | ICD-10-CM | POA: Insufficient documentation

## 2021-04-24 DIAGNOSIS — J45909 Unspecified asthma, uncomplicated: Secondary | ICD-10-CM | POA: Diagnosis not present

## 2021-04-24 DIAGNOSIS — N261 Atrophy of kidney (terminal): Secondary | ICD-10-CM | POA: Diagnosis not present

## 2021-04-24 HISTORY — DX: Epilepsy, unspecified, not intractable, without status epilepticus: G40.909

## 2021-04-24 LAB — COMPREHENSIVE METABOLIC PANEL
ALT: 13 U/L (ref 0–44)
AST: 16 U/L (ref 15–41)
Albumin: 3.4 g/dL — ABNORMAL LOW (ref 3.5–5.0)
Alkaline Phosphatase: 79 U/L (ref 38–126)
Anion gap: 13 (ref 5–15)
BUN: 24 mg/dL — ABNORMAL HIGH (ref 8–23)
CO2: 24 mmol/L (ref 22–32)
Calcium: 9.8 mg/dL (ref 8.9–10.3)
Chloride: 99 mmol/L (ref 98–111)
Creatinine, Ser: 5.69 mg/dL — ABNORMAL HIGH (ref 0.44–1.00)
GFR, Estimated: 8 mL/min — ABNORMAL LOW (ref >60)
Glucose, Bld: 89 mg/dL (ref 70–99)
Potassium: 3.3 mmol/L — ABNORMAL LOW (ref 3.5–5.1)
Sodium: 136 mmol/L (ref 135–145)
Total Bilirubin: 0.4 mg/dL (ref 0.3–1.2)
Total Protein: 6.1 g/dL — ABNORMAL LOW (ref 6.5–8.1)

## 2021-04-24 LAB — CBC
HCT: 31.3 % — ABNORMAL LOW (ref 36.0–46.0)
Hemoglobin: 9.9 g/dL — ABNORMAL LOW (ref 12.0–15.0)
MCH: 28.6 pg (ref 26.0–34.0)
MCHC: 31.6 g/dL (ref 30.0–36.0)
MCV: 90.5 fL (ref 80.0–100.0)
Platelets: 242 10*3/uL (ref 150–400)
RBC: 3.46 MIL/uL — ABNORMAL LOW (ref 3.87–5.11)
RDW: 16.1 % — ABNORMAL HIGH (ref 11.5–15.5)
WBC: 12.2 10*3/uL — ABNORMAL HIGH (ref 4.0–10.5)
nRBC: 0 % (ref 0.0–0.2)

## 2021-04-24 LAB — TROPONIN I (HIGH SENSITIVITY)
Troponin I (High Sensitivity): 16 ng/L (ref <18)
Troponin I (High Sensitivity): 20 ng/L — ABNORMAL HIGH (ref <18)

## 2021-04-24 MED ORDER — HYDROCODONE-ACETAMINOPHEN 5-325 MG PO TABS
1.0000 | ORAL_TABLET | Freq: Once | ORAL | Status: AC
  Administered 2021-04-24: 1 via ORAL
  Filled 2021-04-24: qty 1

## 2021-04-24 MED ORDER — IOHEXOL 350 MG/ML SOLN
100.0000 mL | Freq: Once | INTRAVENOUS | Status: AC | PRN
  Administered 2021-04-24: 100 mL via INTRAVENOUS

## 2021-04-24 MED ORDER — NITROGLYCERIN 2 % TD OINT
1.0000 [in_us] | TOPICAL_OINTMENT | Freq: Once | TRANSDERMAL | Status: AC
  Administered 2021-04-24: 1 [in_us] via TOPICAL
  Filled 2021-04-24: qty 1

## 2021-04-24 MED ORDER — HYDROCODONE-ACETAMINOPHEN 5-325 MG PO TABS: 2.0000 | ORAL_TABLET | Freq: Once | ORAL | Status: DC

## 2021-04-24 NOTE — ED Provider Notes (Signed)
Assumed care at 41:21 PM.  71 year old female presenting for chest pain or shortness of breath starting today at around 1 PM.  Currently still having active chest pain.  Heart score 6.  We will talk to cardiology and plan for admission. ?Physical Exam  ?BP (!) 179/66   Pulse 85   Temp 98.1 ?F (36.7 ?C) (Oral)   Resp (!) 30   SpO2 98%  ? ?Physical Exam ?Constitutional:   ?   Appearance: She is well-developed and normal weight. She is ill-appearing. She is not toxic-appearing or diaphoretic.  ?HENT:  ?   Head: Normocephalic and atraumatic.  ?Eyes:  ?   Comments: Chronic blindness  ?Cardiovascular:  ?   Rate and Rhythm: Normal rate and regular rhythm.  ?Pulmonary:  ?   Effort: Tachypnea present. No respiratory distress.  ?   Breath sounds: No wheezing, rhonchi or rales.  ?Abdominal:  ?   Palpations: Abdomen is soft.  ?   Tenderness: There is no abdominal tenderness.  ?Musculoskeletal:  ?   Cervical back: Normal range of motion.  ?   Right lower leg: No edema.  ?   Left lower leg: No edema.  ?Skin: ?   General: Skin is warm and dry.  ?Neurological:  ?   General: No focal deficit present.  ?   Mental Status: She is alert and oriented to person, place, and time.  ?Psychiatric:     ?   Mood and Affect: Mood normal.     ?   Behavior: Behavior normal.  ? ? ?Procedures  ?Procedures ? ?ED Course / MDM  ?  ?Medical Decision Making ?Amount and/or Complexity of Data Reviewed ?Labs: ordered. ?Radiology: ordered. ? ?Risk ?Prescription drug management. ? ? ?On assessment, patient appears uncomfortable.  She endorses 5-6/10 severity chest pain.  Chest pain is located on the left side and radiates to her left back.  She is tachypneic.  Her lungs are clear to auscultation and SPO2 is normal on room air.  I suspect that the tachypnea may be in part due to her current ongoing pain.  Patient has daughter and niece at bedside.  Patient's daughter, who is also her medical power of attorney, states that she has adverse reactions to IV  narcotics.  She did state that she has tolerated hydrocodone in the past.  Dose of Norco was ordered.  At this time, patient's blood pressure is elevated.  NTG ointment was given.  Based on her symptoms, risk factors, and EKG, patient is a heart score of 6.  I spoke with cardiologist on-call, Dr. Harl Bowie.  Dr. Harl Bowie recommended CT dissection study.  I did speak with the patient and her daughter regarding the risks and benefits of a contrasted scan.  Patient and daughter were agreeable to this study.  Dr. Harl Bowie evaluated the patient at bedside and provided recommendations.  Plan will be for admission for observation with repeat troponins and echocardiogram in the morning.  On reassessment, patient reports improvement in chest pain.  Currently rated at 2/10 in severity.  Patient was admitted to hospitalist for further management. ? ? ? ? ?  ?Godfrey Pick, MD ?04/25/21 0003 ? ?

## 2021-04-24 NOTE — Consult Note (Signed)
?Cardiology Consultation:  ? ?Patient ID: Julie Brewer ?MRN: [id number]; DOB: [birth year] ? ?Admit date: 04/24/2021 ?Date of Consult: 04/24/2021 ? ?PCP:  Glenis Smoker, MD ?  ?[facility] Prov[facility] ?Cardiologist:  Quay Burow, MD   { ?   ? ? ?Patient Profile:  ? ?Julie Brewer is a 71 y.o. female with a hx of ESRD, seizures, PAD,  who is being seen 04/24/2021 for the evaluation of chest pain at the request of Dr Doren Custard. ? ?History of Present Illness:  ? ?Ms. Brewer 71 yo female medical history of ESRD, seizure disorer with recent admission this month for active seizures, chronic HFpEF, PAS, DM, HTN, blindness, GI bleeding during 03/2021 admission, presents with chest pain. ? ?Chest pain started suddenly today around 12pm. Was getting up from chair on the porch and had sudden severe 10/10 pressure midchest radiating into left arm and left neck. +nausea  +SOB. Reports she nearly fainted. Sat down in house for about 45 minutes, pain unrelenting. Worst with moving and deep breathing. Call EMS after 45 min and brought to ER. At my interview time 830PM pain has been constant for 8.5 hours though varying in degree. Continues to be significantly worst with movements and position, to the extent she has to lay still in bed. Left chest, left shoulder markedly tender to palpation. Since in ER pain has spread into mid and lower back.  ? ? ? ?ER vitals: 118/54 95% RA p 80 ?WBC 12.2 Hgb 9.9 Plt 242 K 3.3 BUN 24 Cr 5.69  ?Trop 16-->20 ?CT head no acute process ?CXR no acute process ?EKG SR, LAFB, mild lateral precordial TWIs(intermittenly noted on prior EKGs ex Feb 12, 2019) ? ? ?Jan 2021 echo LVEF 65-70%,  ? ? ?Past Medical History:  ?Diagnosis Date  ? Asthma   ? No probnlems recently  ? Blindness and low vision   ? 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 CHF (congestive heart failure) ([place])   ? Chronic kidney disease   ? Tu/Th/Sa  ? Diabetes mellitus   ? Type II per Dr [name]   (patient said type I)  ? Fibroid   ? GERD (gastroesophageal reflux disease)   ? Glaucoma   ? Hyperlipidemia   ? Hypertension   ? Iron deficiency anemia 03/09/2016  ? Peripheral vascular disease ([place])   ? Pneumonia 2006  ? PONV (postoperative nausea and vomiting)   ? Shortness of breath dyspnea   ? with exdrtion, "Walkling too fast"  ? Stroke [facility])   ? no residual  ? ? ?Past Surgical History:  ?Procedure Laterality Date  ? ABDOMINAL HYSTERECTOMY    ? AV FISTULA PLACEMENT Left 04/27/2019  ? Procedure: INSERTION OF ARTERIOVENOUS (AV) GORE-TEX GRAFT LEFT UPPER ARM;  Surgeon: Angelia Mould, MD;  Location: [place];  Service: Vascular;  Laterality: Left;  ? BIOPSY  06/10/2017  ? Procedure: BIOPSY;  Surgeon: Ronnette Juniper, MD;  Location: [place];  Service: Gastroenterology;;  ? BIOPSY  04/10/2021  ? Procedure: BIOPSY;  Surgeon: Otis Brace, MD;  Location: [facility];  Service: Gastroenterology;;  ? BREAST BIOPSY Right   ? CERVICAL FUSION    ? with graft from hip  ? COLONOSCOPY  July 09, 2012  ? DIRECT LARYNGOSCOPY N/A 06/07/2014  ? Procedure: DIRECT LARYNGOSCOPY with BIOPSY and EXCISION VOLLECULAR CYST;  Surgeon: Ruby Cola, MD;  Location: [place];  Service: ENT;  Laterality: N/A;  ? ESOPHAGOGASTRODUODENOSCOPY (EGD) WITH PROPOFOL Left 06/10/2017  ?  Procedure: ESOPHAGOGASTRODUODENOSCOPY (EGD) WITH PROPOFOL;  Surgeon: Ronnette Juniper, MD;  Location: [place];  Service: Gastroenterology;  Laterality: Left;  ? ESOPHAGOGASTRODUODENOSCOPY (EGD) WITH PROPOFOL N/A 04/10/2021  ? Procedure: ESOPHAGOGASTRODUODENOSCOPY (EGD) WITH PROPOFOL;  Surgeon: Otis Brace, MD;  Location: [facility];  Service: Gastroenterology;  Laterality: N/A;  ? FLEXIBLE SIGMOIDOSCOPY Left 06/10/2017  ? Procedure: FLEXIBLE SIGMOIDOSCOPY;  Surgeon: Ronnette Juniper, MD;  Location: [place];  Service: Gastroenterology;  Laterality: Left;  ? HOT HEMOSTASIS N/A 04/10/2021  ? Procedure: HOT HEMOSTASIS (ARGON PLASMA COAGULATION/BICAP);  Surgeon:  Otis Brace, MD;  Location[facility];  Service: Gastroenterology;  Laterality: N/A;  ? LOOP RECORDER INSERTION N/A 06/20/2016  ? Procedure: Loop Recorder Insertion;  Surgeon: [name], MD;  Location: [place] [facility];  Service: Cardiovascular;  Laterality: N/A;  ? REFRACTIVE SURGERY Bilateral   ? both eyes  ? SPINE SURGERY    ? lumbar  ?  ? ?Inpatient Medications: ?Scheduled Meds: ? HYDROcodone-acetaminophen  1 tablet Oral Once  ? nitroGLYCERIN  1 inch Topical Once  ? ?Continuous Infusions: ? ?PRN Meds: ? ? ?Allergies:    ?Allergies  ?Allergen Reactions  ? Morphine And Related Other (See Comments)  ?  Hallucinations   ? Penicillins Swelling and Rash  ?  Throat swelling ?Did it involve swelling of the face/tongue/throat, SOB, or low BP? Yes ?Did it involve sudden or severe rash/hives, skin peeling, or any reaction on the inside of your mouth or nose? Yes ?Did you need to seek medical attention at a hospital or doctor's office? Yes ?When did it last happen?   young child    ?If all above answers are ?NO?, may proceed with cephalosporin use.  ? ? ?Social History:   ?Social History  ? ?Socioeconomic History  ? Marital status: Legally Separated  ?  Spouse name: Not on file  ? Number of children: Not on file  ? Years of education: Not on file  ? Highest education level: Not on file  ?Occupational History  ? Not on file  ?Tobacco Use  ? Smoking status: Every Day  ?  Packs/day: 1.00  ?  Years: 30.00  ?  Pack years: 30.00  ?  Types: Cigarettes  ? Smokeless tobacco: Never  ?Vaping Use  ? Vaping Use: Never used  ?Substance and Sexual Activity  ? Alcohol use: Not Currently  ?  Comment: socially  ? Drug use: Not Currently  ?  Types: Methylphenidate  ? Sexual activity: Never  ?  Birth control/protection: Post-menopausal, Surgical  ?  Comment: Hysterectomy  ?Other Topics Concern  ? Not on file  ?Social History Narrative  ? Right handed  ? Lives with daughter in a one story home  ? Drinks caffeine  ? ?Social  Determinants of Health  ? ?Financial Resource Strain: Not on file  ?Food Insecurity: Not on file  ?Transportation Needs: Not on file  ?Physical Activity: Not on file  ?Stress: Not on file  ?Social Connections: Not on file  ?Intimate Partner Violence: Not on file  ?  ?Family History:   ? ?Family History  ?Problem Relation Age of Onset  ? Cancer Mother   ? Heart disease Mother   ? Diabetes Father   ? Cancer Brother   ? Cancer Brother   ? Throat cancer Brother   ?  ? ?ROS:  ?Please see the history of present illness.  ? ?All other ROS reviewed and negative.    ? ?Physical Exam/Data:  ? ?Vitals:  ? 04/24/21 1730  04/24/21 1830 04/24/21 1930 04/24/21 1945  ?BP: 140/63 (!) 156/68 (!) 179/66 (!) 163/62  ?Pulse: 88 86 85 90  ?Resp: 13 19 (!) 30 (!) 33  ?Temp:      ?TempSrc:      ?SpO2: 90% 97% 98% 98%  ? ?No intake or output data in the 24 hours ending 04/24/21 2006 ? ?  04/13/2021  ?  4:42 PM 04/13/2021  ?  1:30 PM 04/11/2021  ?  4:10 PM  ?Last 3 Weights  ?Weight (lbs) 124 lb 9 oz 127 lb 3.3 oz 133 lb 6.1 oz  ?Weight (kg) 56.5 kg 57.7 kg 60.5 kg  ?   ?There is no height or weight on file to calculate BMI.  ?General:  Well nourished, well developed, in no acute distress ?HEENT: normal ?Neck: no JVD ?Vascular: No carotid bruits; Distal pulses 2+ bilaterally ?Cardiac:  normal S1, S2; RRR; no murmur  ?Lungs:  clear to auscultation bilaterally, no wheezing, rhonchi or rales  ?Abd: soft, nontender, no hepatomegaly  ?Ext: no edema ?Musculoskeletal:  No deformities, BUE and BLE strength normal and equal ?Skin: warm and dry  ?Neuro:  CNs 2-12 intact, no focal abnormalities noted ?Psych:  Normal affect  ? ? ?Laboratory Data: ? ?High Sensitivity Troponin:   ?Recent Labs  ?Lab 04/24/21 ?1547 04/24/21 ?1824  ?TROPONINIHS 16 20*  ?   ?Chemistry ?Recent Labs  ?Lab 04/24/21 ?1547  ?NA 136  ?K 3.3*  ?CL 99  ?CO2 24  ?GLUCOSE 89  ?BUN 24*  ?CREATININE 5.69*  ?CALCIUM 9.8  ?GFRNONAA 8*  ?ANIONGAP 13  ?  ?Recent Labs  ?Lab 04/24/21 ?1547  ?PROT  6.1*  ?ALBUMIN 3.4*  ?AST 16  ?ALT 13  ?ALKPHOS 79  ?BILITOT 0.4  ? ?Lipids No results for input(s): CHOL, TRIG, HDL, LABVLDL, LDLCALC, CHOLHDL in the last 168 hours.  ?Hematology ?Recent Labs  ?Lab 04/24/21 ?1547  ?

## 2021-04-24 NOTE — ED Notes (Signed)
Patient transported to CT 

## 2021-04-24 NOTE — ED Provider Notes (Signed)
?[facility] ?Provider Note ? ? ?CSN: [id number] ?Arrival date & time: 04/24/21  1441 ? ?  ? ?History ? ?Chief Complaint  ?Patient presents with  ? Chest Pain  ? ? ?Julie Brewer is a 71 y.o. female with a past medical history significant for hypertension, diabetes 1, previous seizure disorder on antiepileptic medication including Lamictal 200 mg twice daily who presents with concern for chest pain left-sided with radiation to the back for the last 3 days.  Family concerned because she has had 2 seizures in the last 2 days, normally only has seizures every 3 to 4 months with medication compliance.  She has not missed any doses.  Family endorses that she is acting not quite herself, more forgetful. ? ? ?Chest Pain ? ?HPI: A 71 year old patient with a history of CVA, peripheral artery disease, treated diabetes, hypertension and hypercholesterolemia presents for evaluation of chest pain. Initial onset of pain was less than one hour ago. The patient's chest pain is described as heaviness/pressure/tightness and is not worse with exertion. The patient complains of nausea. The patient's chest pain is middle- or left-sided, is not well-localized, is not sharp and does not radiate to the arms/jaw/neck. The patient denies diaphoresis. The patient has smoked in the past 90 days. The patient has no relevant family history of coronary artery disease (first degree relative at less than age 45) and does not have an elevated BMI (>=30).  ? ?Home Medications ?Prior to Admission medications   ?Medication Sig Start Date End Date Taking? Authorizing Provider  ?acetaminophen (TYLENOL) 325 MG tablet Take 2 tablets (650 mg total) by mouth every 6 (six) hours as needed for mild pain (or Fever >/= 101). 02/20/19   Domenic Polite, MD  ?amLODipine (NORVASC) 5 MG tablet Take 5 mg by mouth daily. 04/06/21   [provider]  ?aspirin 325 MG EC tablet Take 1 tablet (325 mg total) by mouth daily.  04/15/21   Ghimire, Henreitta Leber, MD  ?atorvastatin (LIPITOR) 20 MG tablet Take 1 tablet by mouth once daily ?Patient taking differently: Take 20 mg by mouth daily. 03/20/21   Elayne Snare, MD  ?bisacodyl (DULCOLAX) 5 MG EC tablet Take 5 mg by mouth daily as needed for moderate constipation.    [provider]  ?Blood Glucose Monitoring Suppl (FREESTYLE FREEDOM) KIT Use to check blood sugar once a day dx code 02/05/20   Elayne Snare, MD  ?cilostazol (PLETAL) 100 MG tablet Take 1 tablet by mouth twice daily ?Patient taking differently: Take 100 mg by mouth 2 (two) times daily. 02/01/21   Waynetta Sandy, MD  ?docusate sodium (COLACE) 100 MG capsule Take 200 mg by mouth daily as needed for mild constipation.    [provider]  ?Doxercalciferol (HECTOROL IV) Inject 1 Dose into the vein See admin instructions. During dialysis - Tuesday, Thursday and Saturday 11/22/20 11/21/21  [provider]  ?ferrous sulfate 325 (65 FE) MG tablet Take 650 mg by mouth See admin instructions. On dialysis days - Tuesday,Thursday and Saturday    [provider]  ?gabapentin (NEURONTIN) 100 MG capsule TAKE 1 CAPSULE BY MOUTH TWICE DAILY AS NEEDED 04/18/21   Elayne Snare, MD  ?glucose blood (FREESTYLE LITE) test strip USE AS INSTRUCTED TO CHECK BLOOD SUGAR ONCE DAILY. 05/12/19   Elayne Snare, MD  ?heparin 1000 unit/mL SOLN injection 1,000 Units by Dialysis route See admin instructions. During dialysis 11/15/20 11/14/21  [provider]  ?insulin glargine (LANTUS) 100 UNIT/ML  injection Inject 0.12 mLs (12 Units total) into the skin daily. 04/13/21   Ghimire, Henreitta Leber, MD  ?Insulin Pen Needle 31G X 5 MM MISC Use with pen 02/17/20   Elayne Snare, MD  ?insulin regular (NOVOLIN R) 100 units/mL injection Inject 10 Units into the skin 2 (two) times daily before a meal.    [provider]  ?iron sucrose in sodium chloride 0.9 % 100 mL Inject 100 mg into the vein See admin instructions. During dialysis  11/26/20 11/25/21  [provider]  ?lamoTRIgine (LAMICTAL) 200 MG tablet Take 1 tablet twice a day ?Patient taking differently: Take 200 mg by mouth 2 (two) times daily. 03/31/21   Cameron Sprang, MD  ?lidocaine-prilocaine (EMLA) cream 1 application. daily as needed (port access). 08/10/19   [provider]  ?Methoxy PEG-Epoetin Beta (MIRCERA IJ) Inject 1 Dose into the vein See admin instructions. During dialysis 06/30/20 06/29/21  [provider]  ?pantoprazole (PROTONIX) 40 MG tablet Take 1 tablet (40 mg) by mouth twice daily for 4 weeks, then switch to 1 tablet once daily. 04/13/21   Ghimire, Henreitta Leber, MD  ?triamcinolone ointment (KENALOG) 0.1 % 1 application. daily. 09/28/20   [provider]  ?Vitamin D, Ergocalciferol, (DRISDOL) 1.25 MG (50000 UNIT) CAPS capsule Take 1 capsule by mouth once a week ?Patient taking differently: Take 50,000 Units by mouth See admin instructions. During dialysis 03/16/19   Elayne Snare, MD  ?   ? ?Allergies    ?Morphine and related and Penicillins   ? ?Review of Systems   ?Review of Systems  ?Cardiovascular:  Positive for chest pain.  ?Neurological:  Positive for seizures.  ?All other systems reviewed and are negative. ? ?Physical Exam ?Updated Vital Signs ?BP (!) 179/66   Pulse 85   Temp 98.1 ?F (36.7 ?C) (Oral)   Resp (!) 30   SpO2 98%  ?Physical Exam ?Vitals and nursing note reviewed.  ?Constitutional:   ?   General: She is not in acute distress. ?   Appearance: Normal appearance. She is ill-appearing.  ?HENT:  ?   Head: Normocephalic and atraumatic.  ?Eyes:  ?   General:     ?   Right eye: No discharge.     ?   Left eye: No discharge.  ?   Comments: Patient is blind, pupils unreactive  ?Cardiovascular:  ?   Rate and Rhythm: Normal rate and regular rhythm.  ?   Heart sounds: No murmur heard. ?  No friction rub. No gallop.  ?Pulmonary:  ?   Effort: Pulmonary effort is normal.  ?   Breath sounds: Normal breath sounds.  ?Abdominal:  ?   General:  Bowel sounds are normal.  ?   Palpations: Abdomen is soft.  ?Skin: ?   General: Skin is warm and dry.  ?   Capillary Refill: Capillary refill takes less than 2 seconds.  ?Neurological:  ?   Mental Status: She is alert and oriented to person, place, and time.  ?   Comments: Moves all 4 limbs spontaneously, no unilateral weakness.  Alert and oriented, some delayed responses.  ?Psychiatric:     ?   Mood and Affect: Mood normal.     ?   Behavior: Behavior normal.  ? ? ?ED Results / Procedures / Treatments   ?Labs ?(all labs ordered are listed, but only abnormal results are displayed) ?Labs Reviewed  ?CBC - Abnormal; Notable for the following components:  ?    Result Value  ?  WBC 12.2 (*)   ? RBC 3.46 (*)   ? Hemoglobin 9.9 (*)   ? HCT 31.3 (*)   ? RDW 16.1 (*)   ? All other components within normal limits  ?COMPREHENSIVE METABOLIC PANEL - Abnormal; Notable for the following components:  ? Potassium 3.3 (*)   ? BUN 24 (*)   ? Creatinine, Ser 5.69 (*)   ? Total Protein 6.1 (*)   ? Albumin 3.4 (*)   ? GFR, Estimated 8 (*)   ? All other components within normal limits  ?TROPONIN I (HIGH SENSITIVITY) - Abnormal; Notable for the following components:  ? Troponin I (High Sensitivity) 20 (*)   ? All other components within normal limits  ?TROPONIN I (HIGH SENSITIVITY)  ? ? ?EKG ?EKG Interpretation ? ?Date/Time:  Monday April 24 2021 14:58:01 EDT ?Ventricular Rate:  81 ?PR Interval:  155 ?QRS Duration: 85 ?QT Interval:  398 ?QTC Calculation: 462 ?R Axis:   -41 ?Text Interpretation: Sinus rhythm Left anterior fascicular block Nonspecific T abnormalities, lateral leads Confirmed by Daleen Bo [phone]) on 04/24/2021 5:24:52 PM ? ?Radiology ?DG Chest 2 View ? ?Result Date: 04/24/2021 ?CLINICAL DATA:  Left-sided chest pain for 3 days, seizure EXAM: CHEST - 2 VIEW COMPARISON:  02/18/2019 FINDINGS: Frontal and lateral views of the chest demonstrate an unremarkable cardiac silhouette. No airspace disease, effusion, or pneumothorax.  Chronic interstitial scarring unchanged. No acute bony abnormalities. IMPRESSION: 1. Stable chest, no acute process. Electronically Signed   By: Randa Ngo M.D.   On: 04/24/2021 17:20  ? ?CT Head Wo Contr

## 2021-04-24 NOTE — ED Triage Notes (Signed)
Pt BIB [facility] from home d/t CP. Left side rates to back for 3 days. No n/v. Hx seizure, had one today which is normal for the pt. Pt is a dialysis pt Tuesday, Thursday and Saturday. [facility]& [facility]. Ems GIVEN 324 ASPIRIN which did not help much.  ? ?98/44 ?

## 2021-04-25 ENCOUNTER — Other Ambulatory Visit: Payer: Self-pay

## 2021-04-25 ENCOUNTER — Observation Stay (HOSPITAL_BASED_OUTPATIENT_CLINIC_OR_DEPARTMENT_OTHER): Payer: Medicare Other

## 2021-04-25 ENCOUNTER — Encounter (HOSPITAL_COMMUNITY): Payer: Self-pay | Admitting: Internal Medicine

## 2021-04-25 DIAGNOSIS — R0789 Other chest pain: Secondary | ICD-10-CM | POA: Diagnosis not present

## 2021-04-25 DIAGNOSIS — R079 Chest pain, unspecified: Secondary | ICD-10-CM | POA: Diagnosis not present

## 2021-04-25 DIAGNOSIS — I10 Essential (primary) hypertension: Secondary | ICD-10-CM | POA: Diagnosis not present

## 2021-04-25 LAB — COMPREHENSIVE METABOLIC PANEL
ALT: 13 U/L (ref 0–44)
AST: 16 U/L (ref 15–41)
Albumin: 3.1 g/dL — ABNORMAL LOW (ref 3.5–5.0)
Alkaline Phosphatase: 78 U/L (ref 38–126)
Anion gap: 12 (ref 5–15)
BUN: 23 mg/dL (ref 8–23)
CO2: 25 mmol/L (ref 22–32)
Calcium: 9.4 mg/dL (ref 8.9–10.3)
Chloride: 97 mmol/L — ABNORMAL LOW (ref 98–111)
Creatinine, Ser: 5.98 mg/dL — ABNORMAL HIGH (ref 0.44–1.00)
GFR, Estimated: 7 mL/min — ABNORMAL LOW (ref >60)
Glucose, Bld: 79 mg/dL (ref 70–99)
Potassium: 3.7 mmol/L (ref 3.5–5.1)
Sodium: 134 mmol/L — ABNORMAL LOW (ref 135–145)
Total Bilirubin: 0.3 mg/dL (ref 0.3–1.2)
Total Protein: 5.7 g/dL — ABNORMAL LOW (ref 6.5–8.1)

## 2021-04-25 LAB — RESP PANEL BY RT-PCR (FLU A&B, COVID) ARPGX2
Influenza A by PCR: NEGATIVE
Influenza B by PCR: NEGATIVE
SARS Coronavirus 2 by RT PCR: NEGATIVE

## 2021-04-25 LAB — ECHOCARDIOGRAM COMPLETE
AR max vel: 2.26 cm2
AV Area VTI: 2.39 cm2
AV Area mean vel: 2.29 cm2
AV Mean grad: 3 mmHg
AV Peak grad: 5.2 mmHg
Ao pk vel: 1.14 m/s
Area-P 1/2: 3.91 cm2
Calc EF: 65 %
S' Lateral: 2.6 cm
Single Plane A2C EF: 58.9 %
Single Plane A4C EF: 69 %

## 2021-04-25 LAB — CBC WITH DIFFERENTIAL/PLATELET
Abs Immature Granulocytes: 0.06 10*3/uL (ref 0.00–0.07)
Basophils Absolute: 0.1 10*3/uL (ref 0.0–0.1)
Basophils Relative: 1 %
Eosinophils Absolute: 0.2 10*3/uL (ref 0.0–0.5)
Eosinophils Relative: 2 %
HCT: 30.5 % — ABNORMAL LOW (ref 36.0–46.0)
Hemoglobin: 9.5 g/dL — ABNORMAL LOW (ref 12.0–15.0)
Immature Granulocytes: 1 %
Lymphocytes Relative: 16 %
Lymphs Abs: 1.7 10*3/uL (ref 0.7–4.0)
MCH: 28.2 pg (ref 26.0–34.0)
MCHC: 31.1 g/dL (ref 30.0–36.0)
MCV: 90.5 fL (ref 80.0–100.0)
Monocytes Absolute: 0.7 10*3/uL (ref 0.1–1.0)
Monocytes Relative: 7 %
Neutro Abs: 7.5 10*3/uL (ref 1.7–7.7)
Neutrophils Relative %: 73 %
Platelets: 260 10*3/uL (ref 150–400)
RBC: 3.37 MIL/uL — ABNORMAL LOW (ref 3.87–5.11)
RDW: 16.2 % — ABNORMAL HIGH (ref 11.5–15.5)
WBC: 10.3 10*3/uL (ref 4.0–10.5)
nRBC: 0 % (ref 0.0–0.2)

## 2021-04-25 LAB — PHOSPHORUS: Phosphorus: 5.5 mg/dL — ABNORMAL HIGH (ref 2.5–4.6)

## 2021-04-25 LAB — TROPONIN I (HIGH SENSITIVITY): Troponin I (High Sensitivity): 18 ng/L — ABNORMAL HIGH (ref <18)

## 2021-04-25 LAB — MAGNESIUM: Magnesium: 2.4 mg/dL (ref 1.7–2.4)

## 2021-04-25 LAB — CBG MONITORING, ED
Glucose-Capillary: 79 mg/dL (ref 70–99)
Glucose-Capillary: 98 mg/dL (ref 70–99)

## 2021-04-25 MED ORDER — DOXERCALCIFEROL 4 MCG/2ML IV SOLN
9.0000 ug | INTRAVENOUS | Status: DC
  Administered 2021-04-25: 9 ug via INTRAVENOUS
  Filled 2021-04-25 (×2): qty 6

## 2021-04-25 MED ORDER — CHLORHEXIDINE GLUCONATE CLOTH 2 % EX PADS: 6.0000 | MEDICATED_PAD | Freq: Every day | CUTANEOUS | Status: DC

## 2021-04-25 MED ORDER — ACETAMINOPHEN 325 MG PO TABS
650.0000 mg | ORAL_TABLET | Freq: Four times a day (QID) | ORAL | Status: DC | PRN
  Administered 2021-04-25: 650 mg via ORAL
  Filled 2021-04-25: qty 2

## 2021-04-25 MED ORDER — HEPARIN SODIUM (PORCINE) 1000 UNIT/ML IJ SOLN
INTRAMUSCULAR | Status: AC
  Filled 2021-04-25: qty 5

## 2021-04-25 MED ORDER — PANTOPRAZOLE SODIUM 40 MG PO TBEC
40.0000 mg | DELAYED_RELEASE_TABLET | Freq: Every day | ORAL | Status: DC
  Administered 2021-04-25: 40 mg via ORAL
  Filled 2021-04-25: qty 1

## 2021-04-25 MED ORDER — ACETAMINOPHEN 650 MG RE SUPP: 650.0000 mg | Freq: Four times a day (QID) | RECTAL | Status: DC | PRN

## 2021-04-25 MED ORDER — AMLODIPINE BESYLATE 5 MG PO TABS
5.0000 mg | ORAL_TABLET | Freq: Every day | ORAL | Status: DC
Start: 2021-04-25 — End: 2021-04-26
  Administered 2021-04-25: 5 mg via ORAL
  Filled 2021-04-25: qty 1

## 2021-04-25 MED ORDER — INSULIN ASPART 100 UNIT/ML IJ SOLN: 0.0000 [IU] | Freq: Three times a day (TID) | INTRAMUSCULAR | Status: DC

## 2021-04-25 MED ORDER — FENTANYL CITRATE PF 50 MCG/ML IJ SOSY: 25.0000 ug | PREFILLED_SYRINGE | INTRAMUSCULAR | Status: DC | PRN

## 2021-04-25 MED ORDER — LAMOTRIGINE 100 MG PO TABS
200.0000 mg | ORAL_TABLET | Freq: Two times a day (BID) | ORAL | Status: DC
  Administered 2021-04-25: 200 mg via ORAL
  Filled 2021-04-25 (×2): qty 2

## 2021-04-25 MED ORDER — ATORVASTATIN CALCIUM 10 MG PO TABS
20.0000 mg | ORAL_TABLET | Freq: Every day | ORAL | Status: DC
  Administered 2021-04-25: 20 mg via ORAL
  Filled 2021-04-25: qty 2

## 2021-04-25 MED ORDER — ASPIRIN EC 325 MG PO TBEC
325.0000 mg | DELAYED_RELEASE_TABLET | Freq: Every day | ORAL | Status: DC
Start: 2021-04-25 — End: 2021-04-26
  Administered 2021-04-25: 325 mg via ORAL
  Filled 2021-04-25: qty 1

## 2021-04-25 NOTE — Assessment & Plan Note (Signed)
#)   Atypical Chest Pain: Single episode of sharp substernal chest pain radiating to the back, that was nonexertional, and did not improve with nitroglycerin, but rather improved and subsequently resolved with prn opioids, which appears to be atypical for ACS.  EKG shows sinus rhythm without acute ischemic changes, including no evidence of ST elevation.  Chest x-ray without acute cardiopulmonary process, including no evidence of pneumothorax.  CTA chest, abdomen, pelvis showed no evidence of acute process, including no evidence of aortic dissection or aneurysm.  Initial high-sensitivity troponin I noted to be 16, with repeat value trending up slightly to 20, with interval increase potentially in the setting of known diminished renal clearance as a consequence of end-stage renal disease.  ?  ?patient's case d/w the on-call cardiologist, Dr. Harl Bowie, Who will formally consult and has evaluated the patient this evening.  He recommends overnight observation to the hospital service for further evaluation and management of presenting chest pain, with additional recommendations pending at this time. ?  ?  ?  ?Plan: Cardiology consulted, as above.  Trend serial troponin. Monitor on telemetry.  Prn IV fentanyl. PRN EKG for subsequent episodes of chest pain. Check serum Mg level and repeat BMP in the morning. Repeat CBC in the AM.  Echocardiogram ordered for the morning.  Resume home full dose aspirin, with next dose now.  Resume home atorvastatin.  ?  ?  ?  ?  ?

## 2021-04-25 NOTE — Assessment & Plan Note (Signed)
?  ?  ?#)   Secondary hypertension: documented h/o such in the setting of known end-stage renal disease, with outpatient antihypertensive regimen including Norvasc.  SBP's in the ED today: Normotensive.  ?  ?Plan: Close monitoring of subsequent BP via routine VS. continue home Norvasc ?  ?  ?

## 2021-04-25 NOTE — Procedures (Signed)
I was present at this dialysis session. I have reviewed the session itself and made appropriate changes.  ? ?Chest pain resolved. Likely DC today post HD.  1L UF goal.  Pt w/o complaints.  ? Danley Danker Weights  ? 04/25/21 1431  ?Weight: 58.1 kg  ? ? ?Recent Labs  ?Lab 04/25/21 ?0425  ?NA 134*  ?K 3.7  ?CL 97*  ?CO2 25  ?GLUCOSE 79  ?BUN 23  ?CREATININE 5.98*  ?CALCIUM 9.4  ?PHOS 5.5*  ? ? ?Recent Labs  ?Lab 04/24/21 ?1547 04/25/21 ?0425  ?WBC 12.2* 10.3  ?NEUTROABS  --  7.5  ?HGB 9.9* 9.5*  ?HCT 31.3* 30.5*  ?MCV 90.5 90.5  ?PLT 242 260  ? ? ?Scheduled Meds: ? amLODipine  5 mg Oral Daily  ? aspirin  325 mg Oral Daily  ? atorvastatin  20 mg Oral Daily  ? Chlorhexidine Gluconate Cloth  6 each Topical Q0600  ? doxercalciferol  9 mcg Intravenous Q T,Th,Sa-HD  ? heparin sodium (porcine)      ? insulin aspart  0-6 Units Subcutaneous TID WC  ? lamoTRIgine  200 mg Oral BID  ? pantoprazole  40 mg Oral Daily  ? ?Continuous Infusions: ?PRN Meds:.acetaminophen **OR** acetaminophen, fentaNYL (SUBLIMAZE) injection   ?Pearson Grippe  MD ?04/25/2021, 2:48 PM ?  ?

## 2021-04-25 NOTE — Discharge Summary (Signed)
Physician Discharge Summary  ?Nico Rogness Ayers-Farrar STM:[id number] DOB: [birth year] DOA: 04/24/2021 ? ?PCP: Glenis Smoker, MD ? ?Admit date: 04/24/2021 ?Discharge date: 04/25/2021 ? ?Time spent: 40  minutes ? ?Recommendations for Outpatient Follow-up:  ?Follow-up with primary care physician in 1 to 2 weeks ?Continue routine dialysis as an outpatient ? ?Discharge Diagnoses:  ?Principal Problem: ?  Atypical chest pain ?Active Problems: ?  Essential hypertension ?  DM2 (diabetes mellitus, type 2) ([place]) ?  GERD (gastroesophageal reflux disease) ?  Tobacco abuse ?  Anemia in chronic kidney disease ?  End stage renal disease ([place]) ?  Hyperlipidemia, unspecified ?  Seizure disorder ([place]) ? ? ?Discharge Condition: Stable ? ? ?Filed Weights  ? 04/25/21 1431  ?Weight: 58.1 kg  ? ? ?History of present illness:  ?HPI: Julie Brewer is a 71 y.o. female with medical history significant for end-stage renal disease on hemodialysis, on Tuesday, Thursday, Saturday schedule, type 2 diabetes mellitus, GERD, hypertension, hyperlipidemia, anemia of chronic kidney disease with baseline hemoglobin 8.5-10, seizure disorder, who is admitted to [facility] on 04/24/2021 for further evaluation management of presenting chest pain. ?  ?Reports acute onset of sharp, substernal chest pain with radiation into the back starting at noon on 04/24/2021.  Pain has been nonexertional, nonpositional, nonpleuritic, reproducible with direct palpation of the anterior chest wall.  Denies any associated shortness of breath, nausea, vomiting, diaphoresis, dizziness, or palpitations.  Not associate with any cough, mopped assist, subjective fever, chills, rigors, or generalized myalgias.  No recent trauma or travel.  Not associate with any new lower extremity erythema.  Additionally, no recent calf tenderness or new lower extremity erythema.  ?  ?Medical history notable for hypertension, hyperlipidemia, type 2 diabetes mellitus.  She is on a daily  full dose aspirin as an outpatient as well as atorvastatin 20 mg p.o. daily.  She also notes a history of chronic tobacco abuse, having smoked approximately 1 pack/day over the last 30 years. ?  ?Of note, chest pain remained constant throughout the afternoon and the patient was still experiencing the above chest discomfort upon arrival to [facility] emergency department.  No improvement in chest pain following administration of nitroglycerin paste.  However, chest pain significantly improved following single dose of Norco, before completely resolving without subsequent recurrence, in the absence of any additional administration of nitroglycerin.. ?  ?Of note, she also has a history of end-stage renal disease on hemodialysis, Tuesday, Thursday, Saturday schedule, she reports good compliance and attending her HD sessions, with most recent completed session on Thursday, 04/20/2021. ?  ?  ?  ?ED Course:  ?  ?Vital signs in the ED were notable for the following: Afebrile; blood pressure 118/54; respiratory rate 13-20, oxygen saturation 95 to 100% on room air. ?  ?Labs were notable for the following: CMP notable for bicarbonate 24, glucose 89, liver enzymes within normal limits.  High-sensitivity troponin I initially 16, with repeat value trending up slightly to 20.  CBC notable for hemoglobin 9.9 compared to most recent prior value of 9.3 on 04/13/2021. ?  ?Imaging and additional notable ED work-up: EKG shows sinus rhythm left anterior fascicular block, heart rate 81, no evidence of T wave or ST changes, including no evidence of ST elevation.  Chest x-ray showed no evidence of acute cardiopulmonary process, including no evidence of infiltrate, edema, effusion, or pneumothorax. ?  ?In the setting of substernal chest discomfort radiating into the back, CTA chest, abdomen, pelvis, with dissection protocol was pursued,  and showed no evidence of acute intrathoracic intra-abdominal, or intra-pelvic process, including no evidence  of aortic aneurysm or dissection. ?  ?EDP discussed patient's case with the on-call cardiologist, Dr. Harl Bowie, Who will formally consult and has evaluated the patient this evening.  He recommends overnight observation to the hospital service for further evaluation and management of presenting chest pain, with additional recommendations pending at this time. ?  ?While in the ED, the following were administered: Nitroglycerin paste, Norco 5/325 mg p.o. x1 dose. ?  ?Subsequently, the patient was admitted for overnight observation for further evaluation management of presenting atypical chest pain. ?  ?  ? ?Hospital Course:  ?Patient was observed had serial cardiac enzymes which were all negative and a nonischemic EKG.  She had a cardiac echo which was unrevealing of any marked abnormalities with a normal EF.  Cardiology was consulted thought patient's pain was musculoskeletal.  Patient had routine dialysis while she was here.  Routinely gets dialysis Tuesday Thursday Saturday.  Patient be discharged after her dialysis session today.  No further cardiac work-up recommended.  She is to follow-up with primary care physician in 1 to 2 weeks.  Her pain had resolved and improved by the time of discharge. ? ?Consultations: ?Cardiology ? ?Discharge Exam: ?Vitals:  ? 04/25/21 1500 04/25/21 1530  ?BP: (!) 170/93 (!) 171/78  ?Pulse: 90 88  ?Resp: 16 17  ?Temp:    ?SpO2:    ? ? ?General: Alert and oriented x4 no apparent distress ?Cardiovascular: Regular rate and rhythm without murmurs rubs or gallops ?Respiratory: Clear to auscultation bilaterally no wheezes rhonchi rales ? ?Discharge Instructions ? ? ?Discharge Instructions   ? ? Diet - low sodium heart healthy   Complete by: As directed ?  ? Discharge instructions   Complete by: As directed ?  ? Follow-up with PCP in 1 to 2 weeks  ? Increase activity slowly   Complete by: As directed ?  ? ?  ? ?Allergies as of 04/25/2021   ? ?   Reactions  ? Morphine And Related Other (See  Comments)  ? Hallucinations   ? Penicillins Swelling, Rash  ? Throat swelling ?Did it involve swelling of the face/tongue/throat, SOB, or low BP? Yes ?Did it involve sudden or severe rash/hives, skin peeling, or any reaction on the inside of your mouth or nose? Yes ?Did you need to seek medical attention at a hospital or doctor's office? Yes ?When did it last happen?   young child    ?If all above answers are ?NO?, may proceed with cephalosporin use.  ? ?  ? ?  ?Medication List  ?  ? ?TAKE these medications   ? ?acetaminophen 325 MG tablet ?Commonly known as: TYLENOL ?Take 2 tablets (650 mg total) by mouth every 6 (six) hours as needed for mild pain (or Fever >/= 101). ?  ?amLODipine 5 MG tablet ?Commonly known as: NORVASC ?Take 5 mg by mouth daily. ?  ?aspirin 325 MG EC tablet ?Take 1 tablet (325 mg total) by mouth daily. ?  ?atorvastatin 20 MG tablet ?Commonly known as: LIPITOR ?Take 1 tablet by mouth once daily ?  ?bisacodyl 5 MG EC tablet ?Commonly known as: DULCOLAX ?Take 5 mg by mouth daily as needed for moderate constipation. ?  ?cilostazol 100 MG tablet ?Commonly known as: PLETAL ?Take 1 tablet by mouth twice daily ?  ?docusate sodium 100 MG capsule ?Commonly known as: COLACE ?Take 200 mg by mouth daily as needed for mild constipation. ?  ?  FreeStyle Freedom Kit ?Use to check blood sugar once a day dx code ?  ?FREESTYLE LITE test strip ?Generic drug: glucose blood ?USE AS INSTRUCTED TO CHECK BLOOD SUGAR ONCE DAILY. ?  ?gabapentin 100 MG capsule ?Commonly known as: NEURONTIN ?TAKE 1 CAPSULE BY MOUTH TWICE DAILY AS NEEDED ?What changed: See the new instructions. ?  ?HECTOROL IV ?Inject 1 Dose into the vein See admin instructions. During dialysis - Tuesday, Thursday and Saturday ?  ?heparin 1000 unit/mL Soln injection ?1,000 Units by Dialysis route See admin instructions. During dialysis ?  ?insulin glargine 100 UNIT/ML injection ?Commonly known as: Lantus ?Inject 0.12 mLs (12 Units total) into the skin daily. ?   ?Insulin Pen Needle 31G X 5 MM Misc ?Use with pen ?  ?insulin regular 100 units/mL injection ?Commonly known as: NOVOLIN R ?Inject 7-10 Units into the skin See admin instructions. Taking 10 units with br

## 2021-04-25 NOTE — Assessment & Plan Note (Signed)
?  ?#)   Type 2 Diabetes Mellitus: documented history of such. Home insulin regimen: Lantus 12 units subcu daily as well as regular insulin 10 units subcu twice daily. Home oral hypoglycemic agents: None. presenting blood sugar: 89.  ?  ?Plan: accuchecks QAC and HS with very low-dose SSI.  Holding home basal insulin for now.  Holding home scheduled regular insulin for now. ?  ?  ?  ?

## 2021-04-25 NOTE — H&P (Signed)
?History and Physical  ? ? ?PLEASE NOTE THAT DRAGON DICTATION SOFTWARE WAS USED IN THE CONSTRUCTION OF THIS NOTE. ? ? ?Julie Brewer OZD:[id number] DOB: 07-05-1950 DOA: 04/24/2021 ? ?PCP: Glenis Smoker, MD  ?Patient coming from: home  ? ?I have personally briefly reviewed patient's old medical records in [place] ? ?Chief Complaint: Chest pain ? ?HPI: Julie Brewer is a 71 y.o. female with medical history significant for end-stage renal disease on hemodialysis, on Tuesday, Thursday, Saturday schedule, type 2 diabetes mellitus, GERD, hypertension, hyperlipidemia, anemia of chronic kidney disease with baseline hemoglobin 8.5-10, seizure disorder, who is admitted to [facility] on 04/24/2021 for further evaluation management of presenting chest pain. ? ?Reports acute onset of sharp, substernal chest pain with radiation into the back starting at noon on 04/24/2021.  Pain has been nonexertional, nonpositional, nonpleuritic, reproducible with direct palpation of the anterior chest wall.  Denies any associated shortness of breath, nausea, vomiting, diaphoresis, dizziness, or palpitations.  Not associate with any cough, mopped assist, subjective fever, chills, rigors, or generalized myalgias.  No recent trauma or travel.  Not associate with any new lower extremity erythema.  Additionally, no recent calf tenderness or new lower extremity erythema.  ? ?Medical history notable for hypertension, hyperlipidemia, type 2 diabetes mellitus.  She is on a daily full dose aspirin as an outpatient as well as atorvastatin 20 mg p.o. daily.  She also notes a history of chronic tobacco abuse, having smoked approximately 1 pack/day over the last 30 years. ? ?Of note, chest pain remained constant throughout the afternoon and the patient was still experiencing the above chest discomfort upon arrival to [facility] emergency department.  No improvement in chest pain following administration of nitroglycerin  paste.  However, chest pain significantly improved following single dose of Norco, before completely resolving without subsequent recurrence, in the absence of any additional administration of nitroglycerin.. ? ?Of note, she also has a history of end-stage renal disease on hemodialysis, Tuesday, Thursday, Saturday schedule, she reports good compliance and attending her HD sessions, with most recent completed session on Thursday, 04/20/2021. ? ? ? ?ED Course:  ? ?Vital signs in the ED were notable for the following: Afebrile; blood pressure 118/54; respiratory rate 13-20, oxygen saturation 95 to 100% on room air. ? ?Labs were notable for the following: CMP notable for bicarbonate 24, glucose 89, liver enzymes within normal limits.  High-sensitivity troponin I initially 16, with repeat value trending up slightly to 20.  CBC notable for hemoglobin 9.9 compared to most recent prior value of 9.3 on 04/13/2021. ? ?Imaging and additional notable ED work-up: EKG shows sinus rhythm left anterior fascicular block, heart rate 81, no evidence of T wave or ST changes, including no evidence of ST elevation.  Chest x-ray showed no evidence of acute cardiopulmonary process, including no evidence of infiltrate, edema, effusion, or pneumothorax. ? ?In the setting of substernal chest discomfort radiating into the back, CTA chest, abdomen, pelvis, with dissection protocol was pursued, and showed no evidence of acute intrathoracic intra-abdominal, or intra-pelvic process, including no evidence of aortic aneurysm or dissection. ? ?EDP discussed patient's case with the on-call cardiologist, Dr. Harl Bowie, Who will formally consult and has evaluated the patient this evening.  He recommends overnight observation to the hospital service for further evaluation and management of presenting chest pain, with additional recommendations pending at this time. ? ?While in the ED, the following were administered: Nitroglycerin paste, Norco 5/325 mg p.o.  x1 dose. ? ?  Subsequently, the patient was admitted for overnight observation for further evaluation management of presenting atypical chest pain. ? ? ? ?Review of Systems: As per HPI otherwise 10 point review of systems negative.  ? ?Past Medical History:  ?Diagnosis Date  ? Asthma   ? No probnlems recently  ? Blindness and low vision   ? 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 CHF (congestive heart failure) ([place])   ? Chronic kidney disease   ? Tu/Th/Sa  ? Diabetes mellitus   ? Type II per Dr [name]  (patient said type I)  ? Fibroid   ? GERD (gastroesophageal reflux disease)   ? Glaucoma   ? Hyperlipidemia   ? Hypertension   ? Iron deficiency anemia 03/09/2016  ? Peripheral vascular disease ([place])   ? Pneumonia 2006  ? PONV (postoperative nausea and vomiting)   ? Seizure disorder ([place])   ? Shortness of breath dyspnea   ? with exdrtion, "Walkling too fast"  ? Stroke [facility])   ? no residual  ? ? ?Past Surgical History:  ?Procedure Laterality Date  ? ABDOMINAL HYSTERECTOMY    ? AV FISTULA PLACEMENT Left 04/27/2019  ? Procedure: INSERTION OF ARTERIOVENOUS (AV) GORE-TEX GRAFT LEFT UPPER ARM;  Surgeon: Angelia Mould, MD;  Location: [place];  Service: Vascular;  Laterality: Left;  ? BIOPSY  06/10/2017  ? Procedure: BIOPSY;  Surgeon: Ronnette Juniper, MD;  Location: [place];  Service: Gastroenterology;;  ? BIOPSY  04/10/2021  ? Procedure: BIOPSY;  Surgeon: Otis Brace, MD;  Location: [facility];  Service: Gastroenterology;;  ? BREAST BIOPSY Right   ? CERVICAL FUSION    ? with graft from hip  ? COLONOSCOPY  July 09, 2012  ? DIRECT LARYNGOSCOPY N/A 06/07/2014  ? Procedure: DIRECT LARYNGOSCOPY with BIOPSY and EXCISION VOLLECULAR CYST;  Surgeon: Ruby Cola, MD;  Location: [place];  Service: ENT;  Laterality: N/A;  ? ESOPHAGOGASTRODUODENOSCOPY (EGD) WITH PROPOFOL Left 06/10/2017  ? Procedure: ESOPHAGOGASTRODUODENOSCOPY (EGD) WITH PROPOFOL;  Surgeon: Ronnette Juniper, MD;  Location: [place];  Service:  Gastroenterology;  Laterality: Left;  ? ESOPHAGOGASTRODUODENOSCOPY (EGD) WITH PROPOFOL N/A 04/10/2021  ? Procedure: ESOPHAGOGASTRODUODENOSCOPY (EGD) WITH PROPOFOL;  Surgeon: Otis Brace, MD;  Location: [facility];  Service: Gastroenterology;  Laterality: N/A;  ? FLEXIBLE SIGMOIDOSCOPY Left 06/10/2017  ? Procedure: FLEXIBLE SIGMOIDOSCOPY;  Surgeon: Ronnette Juniper, MD;  Location: [place];  Service: Gastroenterology;  Laterality: Left;  ? HOT HEMOSTASIS N/A 04/10/2021  ? Procedure: HOT HEMOSTASIS (ARGON PLASMA COAGULATION/BICAP);  Surgeon: Otis Brace, MD;  Location: [facility];  Service: Gastroenterology;  Laterality: N/A;  ? LOOP RECORDER INSERTION N/A 06/20/2016  ? Procedure: Loop Recorder Insertion;  Surgeon: [name], MD;  Location: [place] CV [facility];  Service: Cardiovascular;  Laterality: N/A;  ? REFRACTIVE SURGERY Bilateral   ? both eyes  ? SPINE SURGERY    ? lumbar  ? ? ?Social History: ? reports that she has been smoking cigarettes. She has a 30.00 pack-year smoking history. She has never used smokeless tobacco. She reports that she does not currently use alcohol. She reports that she does not currently use drugs after having used the following drugs: Methylphenidate. ? ? ?Allergies  ?Allergen Reactions  ? Morphine And Related Other (See Comments)  ?  Hallucinations   ? Penicillins Swelling and Rash  ?  Throat swelling ?Did it involve swelling of the face/tongue/throat, SOB, or low BP? Yes ?Did it involve sudden or severe rash/hives, skin peeling, or any reaction  on the inside of your mouth or nose? Yes ?Did you need to seek medical attention at a hospital or doctor's office? Yes ?When did it last happen?   young child    ?If all above answers are ?NO?, may proceed with cephalosporin use.  ? ? ?Family History  ?Problem Relation Age of Onset  ? Cancer Mother   ? Heart disease Mother   ? Diabetes Father   ? Cancer Brother   ? Cancer Brother   ? Throat cancer Brother   ? ? ?Family history  reviewed and not pertinent  ? ? ?Prior to Admission medications   ?Medication Sig Start Date End Date Taking? Authorizing Provider  ?acetaminophen (TYLENOL) 325 MG tablet Take 2 tablets (650 mg total) by m

## 2021-04-25 NOTE — Assessment & Plan Note (Signed)
#)   Hyperlipidemia: documented h/o such. On atorvastatin as outpatient.  ?  ?  ?Plan: continue home statin.  ?  ?

## 2021-04-25 NOTE — Progress Notes (Addendum)
? ?Progress Note ? ?Patient Name: Julie Brewer ?Date of Encounter: 04/25/2021 ? ?[facility] Cardiologist: Quay Burow, MD  ? ?Subjective  ? ?Chest pain is almost resolved. BP is elevated.  ? ?Inpatient Medications  ?  ?Scheduled Meds: ? amLODipine  5 mg Oral Daily  ? aspirin  325 mg Oral Daily  ? atorvastatin  20 mg Oral Daily  ? insulin aspart  0-6 Units Subcutaneous TID WC  ? lamoTRIgine  200 mg Oral BID  ? pantoprazole  40 mg Oral Daily  ? ?Continuous Infusions: ? ?PRN Meds: ?acetaminophen **OR** acetaminophen, fentaNYL (SUBLIMAZE) injection  ? ?Vital Signs  ?  ?Vitals:  ? 04/25/21 0600 04/25/21 0700 04/25/21 0720 04/25/21 0742  ?BP: (!) 159/73 (!) 201/71 (!) 188/71   ?Pulse: 74 81 81   ?Resp: '11 15 15   '$ ?Temp:    97.7 ?F (36.5 ?C)  ?TempSrc:    Oral  ?SpO2: 96% 99% 98%   ? ?No intake or output data in the 24 hours ending 04/25/21 0747 ? ?  04/13/2021  ?  4:42 PM 04/13/2021  ?  1:30 PM 04/11/2021  ?  4:10 PM  ?Last 3 Weights  ?Weight (lbs) 124 lb 9 oz 127 lb 3.3 oz 133 lb 6.1 oz  ?Weight (kg) 56.5 kg 57.7 kg 60.5 kg  ?   ? ?Telemetry  ?  ?NSR - Personally Reviewed ? ?ECG  ?  ?N/A ? ?Physical Exam  ? ?GEN: No acute distress.   ?Neck: No JVD ?Cardiac: RRR, no murmurs, rubs, or gallops.  ?Respiratory: Clear to auscultation bilaterally. ?GI: Soft, nontender, non-distended  ?MS: No edema; No deformity. ?Neuro:  Nonfocal  ?Psych: Normal affect  ? ?Labs  ?  ?High Sensitivity Troponin:   ?Recent Labs  ?Lab 04/24/21 ?1547 04/24/21 ?1824 04/25/21 ?0425  ?TROPONINIHS 16 20* 18*  ?   ?Chemistry ?Recent Labs  ?Lab 04/24/21 ?1547 04/25/21 ?0425  ?NA 136 134*  ?K 3.3* 3.7  ?CL 99 97*  ?CO2 24 25  ?GLUCOSE 89 79  ?BUN 24* 23  ?CREATININE 5.69* 5.98*  ?CALCIUM 9.8 9.4  ?MG  --  2.4  ?PROT 6.1* 5.7*  ?ALBUMIN 3.4* 3.1*  ?AST 16 16  ?ALT 13 13  ?ALKPHOS 79 78  ?BILITOT 0.4 0.3  ?GFRNONAA 8* 7*  ?ANIONGAP 13 12  ?  ?Lipids No results for input(s): CHOL, TRIG, HDL, LABVLDL, LDLCALC, CHOLHDL in the last 168 hours.   ?Hematology ?Recent Labs  ?Lab 04/24/21 ?1547 04/25/21 ?0425  ?WBC 12.2* 10.3  ?RBC 3.46* 3.37*  ?HGB 9.9* 9.5*  ?HCT 31.3* 30.5*  ?MCV 90.5 90.5  ?MCH 28.6 28.2  ?MCHC 31.6 31.1  ?RDW 16.1* 16.2*  ?PLT 242 260  ? ?Thyroid No results for input(s): TSH, FREET4 in the last 168 hours.  ?BNPNo results for input(s): BNP, PROBNP in the last 168 hours.  ?DDimer No results for input(s): DDIMER in the last 168 hours.  ? ?Radiology  ?  ?DG Chest 2 View ? ?Result Date: 04/24/2021 ?CLINICAL DATA:  Left-sided chest pain for 3 days, seizure EXAM: CHEST - 2 VIEW COMPARISON:  02/18/2019 FINDINGS: Frontal and lateral views of the chest demonstrate an unremarkable cardiac silhouette. No airspace disease, effusion, or pneumothorax. Chronic interstitial scarring unchanged. No acute bony abnormalities. IMPRESSION: 1. Stable chest, no acute process. Electronically Signed   By: Randa Ngo M.D.   On: 04/24/2021 17:20  ? ?CT Head Wo Contrast ? ?Result Date: 04/24/2021 ?CLINICAL DATA:  Mental status changes of  unknown cause EXAM: CT HEAD WITHOUT CONTRAST TECHNIQUE: Contiguous axial images were obtained from the base of the skull through the vertex without intravenous contrast. RADIATION DOSE REDUCTION: This exam was performed according to the departmental dose-optimization program which includes automated exposure control, adjustment of the mA and/or kV according to patient size and/or use of iterative reconstruction technique. COMPARISON:  04/07/2021 FINDINGS: Brain: Generalized atrophy. Normal ventricular morphology. No midline shift or mass effect. Small vessel chronic ischemic changes of deep cerebral white matter. No intracranial hemorrhage, mass lesion, evidence of acute infarction, or extra-axial fluid collection. Vascular: Atherosclerotic calcification of internal carotid arteries at skull base Skull: Intact Sinuses/Orbits: Clear Other: N/A IMPRESSION: Atrophy with small vessel chronic ischemic changes of deep cerebral white  matter. No acute intracranial abnormalities. Electronically Signed   By: Lavonia Dana M.D.   On: 04/24/2021 16:07  ? ?CT Angio Chest/Abd/Pel for Dissection W and/or Wo Contrast ? ?Result Date: 04/24/2021 ?CLINICAL DATA:  Acute aortic syndrome suspected. EXAM: CT ANGIOGRAPHY CHEST, ABDOMEN AND PELVIS TECHNIQUE: Non-contrast CT of the chest was initially obtained. Multidetector CT imaging through the chest, abdomen and pelvis was performed using the standard protocol during bolus administration of intravenous contrast. Multiplanar reconstructed images and MIPs were obtained and reviewed to evaluate the vascular anatomy. RADIATION DOSE REDUCTION: This exam was performed according to the departmental dose-optimization program which includes automated exposure control, adjustment of the mA and/or kV according to patient size and/or use of iterative reconstruction technique. CONTRAST:  136m OMNIPAQUE IOHEXOL 350 MG/ML SOLN COMPARISON:  Chest radiograph dated 04/24/2021. FINDINGS: CTA CHEST FINDINGS Cardiovascular: There is no cardiomegaly or pericardial effusion. Mild atherosclerotic calcification of the thoracic aorta. No aneurysmal dilatation or dissection. The origins of the great vessels of the aortic arch appear patent as visualized. No pulmonary artery embolus identified. Mediastinum/Nodes: No hilar or mediastinal adenopathy. The esophagus is grossly unremarkable. No mediastinal fluid collection. Lungs/Pleura: Background of centrilobular and paraseptal emphysema. Minimal bibasilar dependent atelectasis. No focal consolidation, pleural effusion, or pneumothorax. The central airways are patent. Musculoskeletal: No acute osseous pathology. Review of the MIP images confirms the above findings. CTA ABDOMEN AND PELVIS FINDINGS VASCULAR Aorta: Mild atherosclerotic calcification. No aneurysmal dilatation or dissection. No periaortic fluid collection. Celiac: Patent without evidence of aneurysm, dissection, vasculitis or  significant stenosis. SMA: Patent without evidence of aneurysm, dissection, vasculitis or significant stenosis. Renals: Both renal arteries are patent without evidence of aneurysm, dissection, vasculitis, fibromuscular dysplasia or significant stenosis. IMA: Patent without evidence of aneurysm, dissection, vasculitis or significant stenosis. Inflow: Atherosclerotic calcification of the iliac arteries. No aneurysmal dilatation or dissection. The iliac arteries are patent. Veins: No obvious venous abnormality within the limitations of this arterial phase study. Review of the MIP images confirms the above findings. NON-VASCULAR No intra-abdominal free air or free fluid. Hepatobiliary: The liver is unremarkable. No intrahepatic biliary dilatation. Multiple gallstones. No evidence of acute cholecystitis by CT. Pancreas: A 1 cm enhancing lesion in the tail of the pancreas demonstrates similar enhancement as the spleen, likely a small splenule. This was likely present on the CT of 06/09/2017 but less conspicuous on that exam. MRI may provide better characterization. No dilatation of the main pancreatic duct or gland atrophy. No active inflammatory changes. Spleen: Normal in size without focal abnormality. Adrenals/Urinary Tract: The adrenal glands are unremarkable. Mild bilateral renal parenchyma atrophy. A 1 cm indeterminate left renal upper pole high attenuating lesion, not characterized. Further evaluation with ultrasound on a nonemergent/outpatient basis recommended. There is no hydronephrosis on either  side. The visualized ureters and urinary bladder appear unremarkable. Stomach/Bowel: There is no bowel obstruction or active inflammation. The appendix is normal. Lymphatic: No adenopathy. Reproductive: Hysterectomy.  No adnexal masses. Other: None Musculoskeletal: Osteopenia with degenerative changes of the spine. No acute osseous pathology. Review of the MIP images confirms the above findings. IMPRESSION: 1. No acute  intrathoracic, abdominal, or pelvic pathology. No aortic aneurysm or dissection. 2. Cholelithiasis. 3. A 1 cm indeterminate left renal upper pole high attenuating lesion, not characterized. Further characterization w

## 2021-04-25 NOTE — Assessment & Plan Note (Signed)
?  ?  ?#)   Chronic tobacco abuse: Patient conveys that they are a current smoker, having smoked 1 ppd for approximately 30 years.  ?  ?Plan: Counseled the patient on the importance of complete smoking discontinuation.   ?  ?

## 2021-04-25 NOTE — ED Notes (Signed)
Pt given apple juice and crackers. RN aware.  ?

## 2021-04-25 NOTE — Progress Notes (Signed)
? ?  Echocardiogram ?2D Echocardiogram has been performed. ? ?Julie Brewer ?04/25/2021, 8:37 AM ?

## 2021-04-25 NOTE — Progress Notes (Signed)
D/C order noted. Contacted [facility] to make clinic aware of pt's d/c from ED today. Clinic advised pt should resume on Thursday.  ? ?Melven Sartorius ?Renal Navigator ?[phone] ?

## 2021-04-25 NOTE — Assessment & Plan Note (Signed)
?  ?  ?#)   ESRD: on HD (schedule: Tuesday, Thursday, Saturday). Next due for routine HD on 04/25/2021. No clinical evidence for urgent overnight HD or to expedite HD relative to this timeframe, including no evidence of hyperkalemia, anion gap metabolic acidosis, uremia, or volume overload.  ?  ?Plan: monitor strict I's/O's, daily weights. CMP in the AM. Check mag and phos levels. Anticipate nonurgent consultation of nephrology in the morning to assist with scheduling of routine hemodialysis, as above. ?  ?

## 2021-04-25 NOTE — Progress Notes (Signed)
Julie Brewer is a 71 Y/O female with ESRD on hemodialysis T,Th,S at [facility]. PMH: DMT2, HTN HFpEF, Seizures. 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 She presented to ED 04/24/2021 with atypical chest pain and was admitted as observation patient. W/U per primary, cardiology.  ? ?We will manage HD today and will consult formally if patient status upgraded to in-patient.  ? ?HD orders: [place] T,Th,S ? 3h  400/1.5   59.6kg   2/2 bath   LUA AVG   ? -Heparin 4500 units IV TIW ? - hect 9 mcg IV  IV tiw ? - mircera 50 mcg q4, last 03/30/2021, due 04/27/2021 ? ? ?Juanell Fairly NPC ?[place] ?[phone] ?

## 2021-04-25 NOTE — Assessment & Plan Note (Signed)
?  ?#)   Seizure disorder: Documented history of such, on Lamictal as an outpatient.  No evidence of active seizures at this time. ?  ?Plan: Continue home Lamictal. ?  ?  ?

## 2021-04-25 NOTE — Assessment & Plan Note (Signed)
?  ?#)   Anemia of chronic kidney disease: Documented history of such, with baseline hemoglobin range of 8.5-10.0, with presenting labs reflecting hemoglobin consistent with this range, in the absence of any evidence of overt active bleed. ?  ?Plan: Repeat CBC in the morning. ?  ?

## 2021-04-25 NOTE — Assessment & Plan Note (Signed)
?  ?#)   GERD: documented h/o such; on Protonix as outpatient.   Plan: continue home PPI.    

## 2021-04-26 DIAGNOSIS — E1122 Type 2 diabetes mellitus with diabetic chronic kidney disease: Secondary | ICD-10-CM | POA: Diagnosis not present

## 2021-04-26 DIAGNOSIS — Z992 Dependence on renal dialysis: Secondary | ICD-10-CM | POA: Diagnosis not present

## 2021-04-26 DIAGNOSIS — N186 End stage renal disease: Secondary | ICD-10-CM | POA: Diagnosis not present

## 2021-04-27 DIAGNOSIS — N2581 Secondary hyperparathyroidism of renal origin: Secondary | ICD-10-CM | POA: Diagnosis not present

## 2021-04-27 DIAGNOSIS — Z992 Dependence on renal dialysis: Secondary | ICD-10-CM | POA: Diagnosis not present

## 2021-04-27 DIAGNOSIS — N186 End stage renal disease: Secondary | ICD-10-CM | POA: Diagnosis not present

## 2021-04-29 DIAGNOSIS — G459 Transient cerebral ischemic attack, unspecified: Secondary | ICD-10-CM | POA: Diagnosis not present

## 2021-04-29 DIAGNOSIS — E785 Hyperlipidemia, unspecified: Secondary | ICD-10-CM | POA: Diagnosis not present

## 2021-04-29 DIAGNOSIS — I132 Hypertensive heart and chronic kidney disease with heart failure and with stage 5 chronic kidney disease, or end stage renal disease: Secondary | ICD-10-CM | POA: Diagnosis not present

## 2021-04-29 DIAGNOSIS — Z992 Dependence on renal dialysis: Secondary | ICD-10-CM | POA: Diagnosis not present

## 2021-04-29 DIAGNOSIS — M1990 Unspecified osteoarthritis, unspecified site: Secondary | ICD-10-CM | POA: Diagnosis not present

## 2021-04-29 DIAGNOSIS — N2581 Secondary hyperparathyroidism of renal origin: Secondary | ICD-10-CM | POA: Diagnosis not present

## 2021-04-29 DIAGNOSIS — E1142 Type 2 diabetes mellitus with diabetic polyneuropathy: Secondary | ICD-10-CM | POA: Diagnosis not present

## 2021-04-29 DIAGNOSIS — K3184 Gastroparesis: Secondary | ICD-10-CM | POA: Diagnosis not present

## 2021-04-29 DIAGNOSIS — R413 Other amnesia: Secondary | ICD-10-CM | POA: Diagnosis not present

## 2021-04-29 DIAGNOSIS — H548 Legal blindness, as defined in USA: Secondary | ICD-10-CM | POA: Diagnosis not present

## 2021-04-29 DIAGNOSIS — E1151 Type 2 diabetes mellitus with diabetic peripheral angiopathy without gangrene: Secondary | ICD-10-CM | POA: Diagnosis not present

## 2021-04-29 DIAGNOSIS — Z8616 Personal history of COVID-19: Secondary | ICD-10-CM | POA: Diagnosis not present

## 2021-04-29 DIAGNOSIS — I5032 Chronic diastolic (congestive) heart failure: Secondary | ICD-10-CM | POA: Diagnosis not present

## 2021-04-29 DIAGNOSIS — E1122 Type 2 diabetes mellitus with diabetic chronic kidney disease: Secondary | ICD-10-CM | POA: Diagnosis not present

## 2021-04-29 DIAGNOSIS — D509 Iron deficiency anemia, unspecified: Secondary | ICD-10-CM | POA: Diagnosis not present

## 2021-04-29 DIAGNOSIS — R1310 Dysphagia, unspecified: Secondary | ICD-10-CM | POA: Diagnosis not present

## 2021-04-29 DIAGNOSIS — E1143 Type 2 diabetes mellitus with diabetic autonomic (poly)neuropathy: Secondary | ICD-10-CM | POA: Diagnosis not present

## 2021-04-29 DIAGNOSIS — N186 End stage renal disease: Secondary | ICD-10-CM | POA: Diagnosis not present

## 2021-04-29 DIAGNOSIS — E1129 Type 2 diabetes mellitus with other diabetic kidney complication: Secondary | ICD-10-CM | POA: Diagnosis not present

## 2021-04-29 DIAGNOSIS — K922 Gastrointestinal hemorrhage, unspecified: Secondary | ICD-10-CM | POA: Diagnosis not present

## 2021-04-29 DIAGNOSIS — Z8673 Personal history of transient ischemic attack (TIA), and cerebral infarction without residual deficits: Secondary | ICD-10-CM | POA: Diagnosis not present

## 2021-04-29 DIAGNOSIS — I70212 Atherosclerosis of native arteries of extremities with intermittent claudication, left leg: Secondary | ICD-10-CM | POA: Diagnosis not present

## 2021-04-29 DIAGNOSIS — D62 Acute posthemorrhagic anemia: Secondary | ICD-10-CM | POA: Diagnosis not present

## 2021-04-30 DIAGNOSIS — M1990 Unspecified osteoarthritis, unspecified site: Secondary | ICD-10-CM | POA: Diagnosis not present

## 2021-04-30 DIAGNOSIS — D62 Acute posthemorrhagic anemia: Secondary | ICD-10-CM | POA: Diagnosis not present

## 2021-04-30 DIAGNOSIS — Z8616 Personal history of COVID-19: Secondary | ICD-10-CM | POA: Diagnosis not present

## 2021-04-30 DIAGNOSIS — G459 Transient cerebral ischemic attack, unspecified: Secondary | ICD-10-CM | POA: Diagnosis not present

## 2021-04-30 DIAGNOSIS — E1151 Type 2 diabetes mellitus with diabetic peripheral angiopathy without gangrene: Secondary | ICD-10-CM | POA: Diagnosis not present

## 2021-04-30 DIAGNOSIS — E1142 Type 2 diabetes mellitus with diabetic polyneuropathy: Secondary | ICD-10-CM | POA: Diagnosis not present

## 2021-04-30 DIAGNOSIS — E785 Hyperlipidemia, unspecified: Secondary | ICD-10-CM | POA: Diagnosis not present

## 2021-04-30 DIAGNOSIS — K922 Gastrointestinal hemorrhage, unspecified: Secondary | ICD-10-CM | POA: Diagnosis not present

## 2021-04-30 DIAGNOSIS — K3184 Gastroparesis: Secondary | ICD-10-CM | POA: Diagnosis not present

## 2021-04-30 DIAGNOSIS — R413 Other amnesia: Secondary | ICD-10-CM | POA: Diagnosis not present

## 2021-04-30 DIAGNOSIS — Z8673 Personal history of transient ischemic attack (TIA), and cerebral infarction without residual deficits: Secondary | ICD-10-CM | POA: Diagnosis not present

## 2021-04-30 DIAGNOSIS — E1122 Type 2 diabetes mellitus with diabetic chronic kidney disease: Secondary | ICD-10-CM | POA: Diagnosis not present

## 2021-04-30 DIAGNOSIS — E1143 Type 2 diabetes mellitus with diabetic autonomic (poly)neuropathy: Secondary | ICD-10-CM | POA: Diagnosis not present

## 2021-04-30 DIAGNOSIS — Z992 Dependence on renal dialysis: Secondary | ICD-10-CM | POA: Diagnosis not present

## 2021-04-30 DIAGNOSIS — D509 Iron deficiency anemia, unspecified: Secondary | ICD-10-CM | POA: Diagnosis not present

## 2021-04-30 DIAGNOSIS — N186 End stage renal disease: Secondary | ICD-10-CM | POA: Diagnosis not present

## 2021-04-30 DIAGNOSIS — H548 Legal blindness, as defined in USA: Secondary | ICD-10-CM | POA: Diagnosis not present

## 2021-04-30 DIAGNOSIS — R1310 Dysphagia, unspecified: Secondary | ICD-10-CM | POA: Diagnosis not present

## 2021-04-30 DIAGNOSIS — I70212 Atherosclerosis of native arteries of extremities with intermittent claudication, left leg: Secondary | ICD-10-CM | POA: Diagnosis not present

## 2021-04-30 DIAGNOSIS — I5032 Chronic diastolic (congestive) heart failure: Secondary | ICD-10-CM | POA: Diagnosis not present

## 2021-04-30 DIAGNOSIS — I132 Hypertensive heart and chronic kidney disease with heart failure and with stage 5 chronic kidney disease, or end stage renal disease: Secondary | ICD-10-CM | POA: Diagnosis not present

## 2021-05-01 DIAGNOSIS — E1122 Type 2 diabetes mellitus with diabetic chronic kidney disease: Secondary | ICD-10-CM | POA: Diagnosis not present

## 2021-05-01 DIAGNOSIS — I5032 Chronic diastolic (congestive) heart failure: Secondary | ICD-10-CM | POA: Diagnosis not present

## 2021-05-01 DIAGNOSIS — M1990 Unspecified osteoarthritis, unspecified site: Secondary | ICD-10-CM | POA: Diagnosis not present

## 2021-05-01 DIAGNOSIS — N186 End stage renal disease: Secondary | ICD-10-CM | POA: Diagnosis not present

## 2021-05-01 DIAGNOSIS — I132 Hypertensive heart and chronic kidney disease with heart failure and with stage 5 chronic kidney disease, or end stage renal disease: Secondary | ICD-10-CM | POA: Diagnosis not present

## 2021-05-01 DIAGNOSIS — E785 Hyperlipidemia, unspecified: Secondary | ICD-10-CM | POA: Diagnosis not present

## 2021-05-02 DIAGNOSIS — N2581 Secondary hyperparathyroidism of renal origin: Secondary | ICD-10-CM | POA: Diagnosis not present

## 2021-05-02 DIAGNOSIS — E1129 Type 2 diabetes mellitus with other diabetic kidney complication: Secondary | ICD-10-CM | POA: Diagnosis not present

## 2021-05-02 DIAGNOSIS — N186 End stage renal disease: Secondary | ICD-10-CM | POA: Diagnosis not present

## 2021-05-02 DIAGNOSIS — Z992 Dependence on renal dialysis: Secondary | ICD-10-CM | POA: Diagnosis not present

## 2021-05-03 DIAGNOSIS — E1122 Type 2 diabetes mellitus with diabetic chronic kidney disease: Secondary | ICD-10-CM | POA: Diagnosis not present

## 2021-05-03 DIAGNOSIS — M1990 Unspecified osteoarthritis, unspecified site: Secondary | ICD-10-CM | POA: Diagnosis not present

## 2021-05-03 DIAGNOSIS — I132 Hypertensive heart and chronic kidney disease with heart failure and with stage 5 chronic kidney disease, or end stage renal disease: Secondary | ICD-10-CM | POA: Diagnosis not present

## 2021-05-03 DIAGNOSIS — I5032 Chronic diastolic (congestive) heart failure: Secondary | ICD-10-CM | POA: Diagnosis not present

## 2021-05-03 DIAGNOSIS — N186 End stage renal disease: Secondary | ICD-10-CM | POA: Diagnosis not present

## 2021-05-03 DIAGNOSIS — E785 Hyperlipidemia, unspecified: Secondary | ICD-10-CM | POA: Diagnosis not present

## 2021-05-04 ENCOUNTER — Telehealth: Payer: Self-pay

## 2021-05-04 DIAGNOSIS — Z992 Dependence on renal dialysis: Secondary | ICD-10-CM | POA: Diagnosis not present

## 2021-05-04 DIAGNOSIS — I132 Hypertensive heart and chronic kidney disease with heart failure and with stage 5 chronic kidney disease, or end stage renal disease: Secondary | ICD-10-CM | POA: Diagnosis not present

## 2021-05-04 DIAGNOSIS — N2581 Secondary hyperparathyroidism of renal origin: Secondary | ICD-10-CM | POA: Diagnosis not present

## 2021-05-04 DIAGNOSIS — E785 Hyperlipidemia, unspecified: Secondary | ICD-10-CM | POA: Diagnosis not present

## 2021-05-04 DIAGNOSIS — N186 End stage renal disease: Secondary | ICD-10-CM | POA: Diagnosis not present

## 2021-05-04 DIAGNOSIS — E1129 Type 2 diabetes mellitus with other diabetic kidney complication: Secondary | ICD-10-CM | POA: Diagnosis not present

## 2021-05-04 DIAGNOSIS — E1122 Type 2 diabetes mellitus with diabetic chronic kidney disease: Secondary | ICD-10-CM | POA: Diagnosis not present

## 2021-05-04 DIAGNOSIS — I5032 Chronic diastolic (congestive) heart failure: Secondary | ICD-10-CM | POA: Diagnosis not present

## 2021-05-04 DIAGNOSIS — M1990 Unspecified osteoarthritis, unspecified site: Secondary | ICD-10-CM | POA: Diagnosis not present

## 2021-05-04 NOTE — Patient Outreach (Signed)
Ms. Calles transferred over to [facility] through the [place]  ?I have assigned Raina Mina, RN to call for follow up within 10 business days.  ?  ?Arville Care, CBCS, CMAA ?[place] Management Assistant ?[facility] ?[phone] ?   ?

## 2021-05-06 DIAGNOSIS — E1129 Type 2 diabetes mellitus with other diabetic kidney complication: Secondary | ICD-10-CM | POA: Diagnosis not present

## 2021-05-06 DIAGNOSIS — Z992 Dependence on renal dialysis: Secondary | ICD-10-CM | POA: Diagnosis not present

## 2021-05-06 DIAGNOSIS — N2581 Secondary hyperparathyroidism of renal origin: Secondary | ICD-10-CM | POA: Diagnosis not present

## 2021-05-06 DIAGNOSIS — N186 End stage renal disease: Secondary | ICD-10-CM | POA: Diagnosis not present

## 2021-05-08 ENCOUNTER — Other Ambulatory Visit: Payer: Self-pay | Admitting: Endocrinology

## 2021-05-08 DIAGNOSIS — E1165 Type 2 diabetes mellitus with hyperglycemia: Secondary | ICD-10-CM

## 2021-05-08 DIAGNOSIS — E785 Hyperlipidemia, unspecified: Secondary | ICD-10-CM | POA: Diagnosis not present

## 2021-05-08 DIAGNOSIS — I5032 Chronic diastolic (congestive) heart failure: Secondary | ICD-10-CM | POA: Diagnosis not present

## 2021-05-08 DIAGNOSIS — I132 Hypertensive heart and chronic kidney disease with heart failure and with stage 5 chronic kidney disease, or end stage renal disease: Secondary | ICD-10-CM | POA: Diagnosis not present

## 2021-05-08 DIAGNOSIS — E1122 Type 2 diabetes mellitus with diabetic chronic kidney disease: Secondary | ICD-10-CM | POA: Diagnosis not present

## 2021-05-08 DIAGNOSIS — N186 End stage renal disease: Secondary | ICD-10-CM | POA: Diagnosis not present

## 2021-05-08 DIAGNOSIS — M1990 Unspecified osteoarthritis, unspecified site: Secondary | ICD-10-CM | POA: Diagnosis not present

## 2021-05-09 DIAGNOSIS — E1129 Type 2 diabetes mellitus with other diabetic kidney complication: Secondary | ICD-10-CM | POA: Diagnosis not present

## 2021-05-09 DIAGNOSIS — I132 Hypertensive heart and chronic kidney disease with heart failure and with stage 5 chronic kidney disease, or end stage renal disease: Secondary | ICD-10-CM | POA: Diagnosis not present

## 2021-05-09 DIAGNOSIS — E785 Hyperlipidemia, unspecified: Secondary | ICD-10-CM | POA: Diagnosis not present

## 2021-05-09 DIAGNOSIS — M1990 Unspecified osteoarthritis, unspecified site: Secondary | ICD-10-CM | POA: Diagnosis not present

## 2021-05-09 DIAGNOSIS — N186 End stage renal disease: Secondary | ICD-10-CM | POA: Diagnosis not present

## 2021-05-09 DIAGNOSIS — E1122 Type 2 diabetes mellitus with diabetic chronic kidney disease: Secondary | ICD-10-CM | POA: Diagnosis not present

## 2021-05-09 DIAGNOSIS — Z992 Dependence on renal dialysis: Secondary | ICD-10-CM | POA: Diagnosis not present

## 2021-05-09 DIAGNOSIS — I5032 Chronic diastolic (congestive) heart failure: Secondary | ICD-10-CM | POA: Diagnosis not present

## 2021-05-09 DIAGNOSIS — N2581 Secondary hyperparathyroidism of renal origin: Secondary | ICD-10-CM | POA: Diagnosis not present

## 2021-05-10 ENCOUNTER — Other Ambulatory Visit: Payer: Self-pay | Admitting: *Deleted

## 2021-05-10 ENCOUNTER — Encounter: Payer: Self-pay | Admitting: *Deleted

## 2021-05-10 DIAGNOSIS — E785 Hyperlipidemia, unspecified: Secondary | ICD-10-CM | POA: Diagnosis not present

## 2021-05-10 DIAGNOSIS — E1122 Type 2 diabetes mellitus with diabetic chronic kidney disease: Secondary | ICD-10-CM | POA: Diagnosis not present

## 2021-05-10 DIAGNOSIS — I5032 Chronic diastolic (congestive) heart failure: Secondary | ICD-10-CM | POA: Diagnosis not present

## 2021-05-10 DIAGNOSIS — N186 End stage renal disease: Secondary | ICD-10-CM | POA: Diagnosis not present

## 2021-05-10 DIAGNOSIS — M1990 Unspecified osteoarthritis, unspecified site: Secondary | ICD-10-CM | POA: Diagnosis not present

## 2021-05-10 DIAGNOSIS — I132 Hypertensive heart and chronic kidney disease with heart failure and with stage 5 chronic kidney disease, or end stage renal disease: Secondary | ICD-10-CM | POA: Diagnosis not present

## 2021-05-10 NOTE — Patient Outreach (Signed)
[facility]) Care Management Telephonic RN Care Manager Note   05/10/2021 Name:  Julie Brewer MRN:  [id number] DOB:  1950/11/30  Summary: Enrolled pt into the HF program and available services via [facility]. No acute needs as pt continue to manager her ongoing medical issues with the assistance from her daughter and three grandchildren in the home. HTN and DM are controlled with ongoing management of care and medication therapy.  Recommendations/Changes made from today's visit: Verified pt is weighted at the dialysis center and requested pt to document her weights accordingly.   Subjective: Julie Brewer is an 71 y.o. year old female who is a primary patient of Chanetta Marshall, Meridee Score, MD. The care management team was consulted for assistance with care management and/or care coordination needs.    Telephonic RN Care Manager completed Telephone Visit today.  Objective:   Medications Reviewed Today     Reviewed by Alejandro Mulling, RN (Registered Nurse) on 05/10/21 at 1520  Med List Status: <None>   Medication Order Taking? Sig Documenting Provider Last Dose Status Informant  acetaminophen (TYLENOL) 325 MG tablet [id number] Yes Take 2 tablets (650 mg total) by mouth every 6 (six) hours as needed for mild pain (or Fever >/= 101). Zannie Cove, MD Taking Active Child  amLODipine (NORVASC) 5 MG tablet [id number] Yes Take 5 mg by mouth daily. [provider] Taking Active Child  aspirin 325 MG EC tablet [id number] Yes Take 1 tablet (325 mg total) by mouth daily. Maretta Bees, MD Taking Active Child  atorvastatin (LIPITOR) 20 MG tablet [id number] Yes Take 1 tablet by mouth once daily  Patient taking differently: Take 20 mg by mouth daily.   Reather Littler, MD Taking Active Child  bisacodyl (DULCOLAX) 5 MG EC tablet [id number] Yes Take 5 mg by mouth daily as needed for moderate constipation. [provider] Taking Active Child  Blood Glucose  Monitoring Suppl (FREESTYLE FREEDOM) KIT [id number] Yes Use to check blood sugar once a day dx code Reather Littler, MD Taking Active Child  cilostazol (PLETAL) 100 MG tablet [id number] No Take 1 tablet by mouth twice daily  Patient not taking: Reported on 05/10/2021   Maeola Harman, MD Not Taking Active Child  docusate sodium (COLACE) 100 MG capsule [id number] Yes Take 200 mg by mouth daily as needed for mild constipation. [provider] Taking Active Child  Doxercalciferol (HECTOROL IV) [id number] Yes Inject 1 Dose into the vein See admin instructions. During dialysis - Tuesday, Thursday and Saturday [provider] Taking Active Child  gabapentin (NEURONTIN) 100 MG capsule [id number] Yes TAKE 1 CAPSULE BY MOUTH TWICE DAILY AS NEEDED  Patient taking differently: 100 mg 2 (two) times daily as needed (neuropathy).   Reather Littler, MD Taking Active Child  glucose blood (FREESTYLE LITE) test strip [id number] No USE AS INSTRUCTED TO CHECK BLOOD SUGAR ONCE DAILY.  Patient not taking: Reported on 05/10/2021   Reather Littler, MD Not Taking Active Child  heparin 1000 unit/mL SOLN injection [id number] Yes 1,000 Units by Dialysis route See admin instructions. During dialysis [provider] Taking Active Child  insulin glargine (LANTUS) 100 UNIT/ML injection [id number] Yes Inject 0.12 mLs (12 Units total) into the skin daily. Maretta Bees, MD Taking Active Child  insulin glargine-yfgn (SEMGLEE) 100 UNIT/ML injection [id number] No INJECT 12 UNITS SUBCUTANEOUSLY ONCE DAILY IN THE MORNING  Patient not taking: Reported on 05/10/2021   Reather Littler, MD Not Taking Active   Insulin Pen Needle 31G X 5  MM MISC [id number] Yes Use with pen Reather Littler, MD Taking Active Child  insulin regular (NOVOLIN R) 100 units/mL injection [id number] Yes Inject 7-10 Units into the skin See admin instructions. Taking 10 units with breakfast and 7 units with dinner [provider] Taking Active Child   iron sucrose in sodium chloride 0.9 % 100 mL [id number] Yes Inject 100 mg into the vein See admin instructions. During dialysis [provider] Taking Active Child  lamoTRIgine (LAMICTAL) 200 MG tablet [id number] Yes Take 1 tablet twice a day  Patient taking differently: Take 200 mg by mouth 2 (two) times daily.   Van Clines, MD Taking Active Child  lidocaine-prilocaine (EMLA) cream [id number] Yes 1 application. daily as needed (port access). [provider] Taking Active Child  Methoxy PEG-Epoetin Beta (MIRCERA IJ) [id number] Yes Inject 1 Dose into the vein See admin instructions. During dialysis [provider] Taking Active Child  pantoprazole (PROTONIX) 40 MG tablet [id number] Yes Take 1 tablet (40 mg) by mouth twice daily for 4 weeks, then switch to 1 tablet once daily.  Patient taking differently: Take 40 mg by mouth daily.   Maretta Bees, MD Taking Active Child  triamcinolone ointment (KENALOG) 0.1 % [id number] Yes 1 application. daily. [provider] Taking Active Child  Vitamin D, Ergocalciferol, (DRISDOL) 1.25 MG (50000 UNIT) CAPS capsule [id number] Yes Take 1 capsule by mouth once a week  Patient taking differently: Provided at dialysis   Reather Littler, MD Taking Active Child           Med Note Jomarie Longs, [facility]   Fri Apr 07, 2021  4:10 PM)               SDOH:  (Social Determinants of Health) assessments and interventions performed:     Care Plan  Review of patient past medical history, allergies, medications, health status, including review of consultants reports, laboratory and other test data, was performed as part of comprehensive evaluation for care management services.   Care Plan : RN Care manager plan of care  Updates made by Alejandro Mulling, RN since 05/10/2021 12:00 AM     Problem: Knowledge Deficit related to CHF and care coordination needs   Priority: High     Long-Range Goal: Development plan of care fore  management of CHF   Start Date: 05/10/2021  Expected End Date: 11/28/2021  Priority: High  Note:   Current Barriers:  Knowledge Deficits related to plan of care for management of CHF   RNCM Clinical Goal(s):  Patient will verbalize basic understanding of  CHF disease process and self health management plan as evidenced by Self reporting and chart review take all medications exactly as prescribed and will call provider for medication related questions as evidenced by self reporting and chart review  through collaboration with RN Care manager, provider, and care team.   Interventions: Inter-disciplinary care team collaboration (see longitudinal plan of care) Evaluation of current treatment plan related to  self management and patient's adherence to plan as established by provider   Heart Failure Interventions:  (Status:  New goal.) Long Term Goal Basic overview and discussion of pathophysiology of Heart Failure reviewed Provided education on low sodium diet Reviewed Heart Failure Action Plan in depth and provided written copy Assessed need for readable accurate scales in home Provided education about placing scale on hard, flat surface Discussed importance of daily weight and advised patient to weigh and record daily Discussed the importance of keeping all appointments with  provider Screening for signs and symptoms of depression related to chronic disease state   Patient Goals/Self-Care Activities: Take all medications as prescribed Attend all scheduled provider appointments Call pharmacy for medication refills 3-7 days in advance of running out of medications Attend church or other social activities Perform all self care activities independently  Perform IADL's (shopping, preparing meals, housekeeping, managing finances) independently Call provider office for new concerns or questions  call office if I gain more than 2 pounds in one day or 5 pounds in one week do ankle pumps when  sitting keep legs up while sitting track weight in diary use salt in moderation watch for swelling in feet, ankles and legs every day bring diary to all appointments develop a rescue plan follow rescue plan if symptoms flare-up know when to call the doctor:ongoign swelling that is not resolved after dialysis track symptoms and what helps feel better or worse Document weekly weights from the dialysis center for HF monitoring  Follow Up Plan:  Telephone follow up appointment with care management team member scheduled for:  May 2023 The patient has been provided with contact information for the care management team and has been advised to call with any health related questions or concerns.        Elliot Cousin, RN Care Management Coordinator [facility] Main Office [phone]

## 2021-05-10 NOTE — Patient Instructions (Signed)
Visit Information ? ?Thank you for taking time to visit with me today. Please don't hesitate to contact me if I can be of assistance to you before our next scheduled telephone appointment. ? ?Following are the goals we discussed today:  ? Take all medications as prescribed ?Attend all scheduled provider appointments ?Call pharmacy for medication refills 3-7 days in advance of running out of medications ?Attend church or other social activities ?Perform all self care activities independently  ?Perform IADL's (shopping, preparing meals, housekeeping, managing finances) independently ?Call provider office for new concerns or questions  ?call office if I gain more than 2 pounds in one day or 5 pounds in one week ?do ankle pumps when sitting ?keep legs up while sitting ?track weight in diary ?use salt in moderation ?watch for swelling in feet, ankles and legs every day ?bring diary to all appointments ?develop a rescue plan ?follow rescue plan if symptoms flare-up ?know when to call the doctor:ongoign swelling that is not resolved after dialysis ?track symptoms and what helps feel better or worse ?Document weekly weights from the dialysis center for HF monitoring ?

## 2021-05-11 ENCOUNTER — Other Ambulatory Visit: Payer: Self-pay | Admitting: Vascular Surgery

## 2021-05-11 DIAGNOSIS — N2581 Secondary hyperparathyroidism of renal origin: Secondary | ICD-10-CM | POA: Diagnosis not present

## 2021-05-11 DIAGNOSIS — I132 Hypertensive heart and chronic kidney disease with heart failure and with stage 5 chronic kidney disease, or end stage renal disease: Secondary | ICD-10-CM | POA: Diagnosis not present

## 2021-05-11 DIAGNOSIS — N186 End stage renal disease: Secondary | ICD-10-CM | POA: Diagnosis not present

## 2021-05-11 DIAGNOSIS — I5032 Chronic diastolic (congestive) heart failure: Secondary | ICD-10-CM | POA: Diagnosis not present

## 2021-05-11 DIAGNOSIS — E785 Hyperlipidemia, unspecified: Secondary | ICD-10-CM | POA: Diagnosis not present

## 2021-05-11 DIAGNOSIS — M1990 Unspecified osteoarthritis, unspecified site: Secondary | ICD-10-CM | POA: Diagnosis not present

## 2021-05-11 DIAGNOSIS — Z992 Dependence on renal dialysis: Secondary | ICD-10-CM | POA: Diagnosis not present

## 2021-05-11 DIAGNOSIS — E1122 Type 2 diabetes mellitus with diabetic chronic kidney disease: Secondary | ICD-10-CM | POA: Diagnosis not present

## 2021-05-11 DIAGNOSIS — E1129 Type 2 diabetes mellitus with other diabetic kidney complication: Secondary | ICD-10-CM | POA: Diagnosis not present

## 2021-05-12 DIAGNOSIS — E785 Hyperlipidemia, unspecified: Secondary | ICD-10-CM | POA: Diagnosis not present

## 2021-05-12 DIAGNOSIS — M1990 Unspecified osteoarthritis, unspecified site: Secondary | ICD-10-CM | POA: Diagnosis not present

## 2021-05-12 DIAGNOSIS — N186 End stage renal disease: Secondary | ICD-10-CM | POA: Diagnosis not present

## 2021-05-12 DIAGNOSIS — I132 Hypertensive heart and chronic kidney disease with heart failure and with stage 5 chronic kidney disease, or end stage renal disease: Secondary | ICD-10-CM | POA: Diagnosis not present

## 2021-05-12 DIAGNOSIS — E1122 Type 2 diabetes mellitus with diabetic chronic kidney disease: Secondary | ICD-10-CM | POA: Diagnosis not present

## 2021-05-12 DIAGNOSIS — I5032 Chronic diastolic (congestive) heart failure: Secondary | ICD-10-CM | POA: Diagnosis not present

## 2021-05-13 DIAGNOSIS — Z992 Dependence on renal dialysis: Secondary | ICD-10-CM | POA: Diagnosis not present

## 2021-05-13 DIAGNOSIS — N186 End stage renal disease: Secondary | ICD-10-CM | POA: Diagnosis not present

## 2021-05-13 DIAGNOSIS — N2581 Secondary hyperparathyroidism of renal origin: Secondary | ICD-10-CM | POA: Diagnosis not present

## 2021-05-13 DIAGNOSIS — E1129 Type 2 diabetes mellitus with other diabetic kidney complication: Secondary | ICD-10-CM | POA: Diagnosis not present

## 2021-05-15 ENCOUNTER — Ambulatory Visit (INDEPENDENT_AMBULATORY_CARE_PROVIDER_SITE_OTHER): Payer: Medicare Other | Admitting: Neurology

## 2021-05-15 ENCOUNTER — Encounter: Payer: Self-pay | Admitting: Neurology

## 2021-05-15 VITALS — BP 163/73 | HR 83 | Ht 63.0 in | Wt 129.0 lb

## 2021-05-15 DIAGNOSIS — N186 End stage renal disease: Secondary | ICD-10-CM | POA: Diagnosis not present

## 2021-05-15 DIAGNOSIS — I132 Hypertensive heart and chronic kidney disease with heart failure and with stage 5 chronic kidney disease, or end stage renal disease: Secondary | ICD-10-CM | POA: Diagnosis not present

## 2021-05-15 DIAGNOSIS — R55 Syncope and collapse: Secondary | ICD-10-CM | POA: Diagnosis not present

## 2021-05-15 DIAGNOSIS — E785 Hyperlipidemia, unspecified: Secondary | ICD-10-CM | POA: Diagnosis not present

## 2021-05-15 DIAGNOSIS — E1122 Type 2 diabetes mellitus with diabetic chronic kidney disease: Secondary | ICD-10-CM | POA: Diagnosis not present

## 2021-05-15 DIAGNOSIS — I5032 Chronic diastolic (congestive) heart failure: Secondary | ICD-10-CM | POA: Diagnosis not present

## 2021-05-15 DIAGNOSIS — M1990 Unspecified osteoarthritis, unspecified site: Secondary | ICD-10-CM | POA: Diagnosis not present

## 2021-05-15 NOTE — Progress Notes (Signed)
? ?NEUROLOGY FOLLOW UP OFFICE NOTE ? ?Julie Brewer ?[id number] ?11-22-1950 ? ?HISTORY OF PRESENT ILLNESS: ?I had the pleasure of seeing Julie Brewer in follow-up in the neurology clinic on 05/15/2021.  The patient was last seen a month ago for recurrent episodes of loss of consciousness concerning for seizures. She is again accompanied by her daughter Julie Brewer who helps supplement the history today. MRI brain and 24-hour EEG in 2022 were unremarkable. On her last visit, they continued to report recurrent episodes of loss of consciousness, Lamotrigine dose increased to 219m BID on on 3/3. Lamotrigine level on 3/3 on lower dose 1027mBID was 8.2. They called our office on 3/10 to report another seizure with bladder and bowel incontinence and was brought to the ER where she was admitted from 3/10 to 04/13/2021. Lamotrigine was held and she had a prolonged 6-day vEEG study which was normal, typical events were not captured. Differentials considered included non-epileptic events as daughter reported most of them occur when she is smoking, versus hemodynamic instability due to HD, less likely epilepsy. She was discharged on home dose of Lamotrigine 20016mID. Findings discussed with patient and daughter. No further episodes of loss of consciousness since 04/07/2021. She has had several falls because her legs are wobbly and weak, she has started physical therapy for strengthening. We discussed smoking cessation, [name] shakes her head, patient reports "I'm addicted."  ? ? ? ?History on Initial Assessment 02/05/2020: This is a pleasant 69 10ar old right-handed woman with a history of hypertension, hyperlipidemia, diabetes, CKD on dialysis, legally blind, stroke in 2010 with residual mild left-sided weakness, presenting for evaluation of recurrent episodes of loss of consciousness. The episodes started around 2-3 years ago. There is no prior warning, she notes that she is eating when they happen most of the time.  She has been living with her daughter PetVerdon Cumminsnce March 2021, and they report episodes around once a month, however she had 2 in December, last episode was 01/23/20. [name] reports they occur either when eating breakfast or dinner, she would make a noise like a growl, then starts falling over to the right side. She is staring with eyes open, not responding, and would be out for a minute. No jerking/convulsive activity seen. She would be a little disoriented and clammy after. She feels tired and would feel the urge to have a bowel movement. She has had urinary incontinence with some of the episodes. They report a bigger episode in January 2021 when she was more confused and did not recognize her family after, [facility] of [facility] in [facility] past, she had a loop recorder due to these episodes which was normal on interrogation. She denies any olfactory/gustatory hallucinations, deja vu, rising epigastric sensation, focal numbness/tingling/weakness, myoclonic jerks. She denies any significant headaches, dizziness, dysarthria/dysphagia. She lost her vision in 2011. She has dialysis T-Th-F. Her mother used to have seizures and was found to have a brain tumor. Otherwise she had a normal birth and early development.  There is no history of febrile convulsions, CNS infections such as meningitis/encephalitis, significant traumatic brain injury, neurosurgical procedures. ? ?Diagnostic Data:  ?MRI brain with and without contrast done 03/2020 did not show any acute changes, there was moderate to advanced chronic microvascular disease, hippocampi symmetric.  ? ?Her routine and 24-hour EEGs in January/February 2022 were normal, EKG showed irregular rhythm, typical events were not captured.  ? ?She has had a loop recorder since 2018 with  no changes during events.  ? ?PAST MEDICAL HISTORY: ?Past Medical History:  ?Diagnosis Date  ? Asthma   ? No probnlems recently  ? Blindness and low  vision   ? 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 CHF (congestive heart failure) ([place])   ? Chronic kidney disease   ? Tu/Th/Sa  ? Diabetes mellitus   ? Type II per Dr [name]  (patient said type I)  ? Fibroid   ? GERD (gastroesophageal reflux disease)   ? Glaucoma   ? Hyperlipidemia   ? Hypertension   ? Iron deficiency anemia 03/09/2016  ? Peripheral vascular disease ([place])   ? Pneumonia 2006  ? PONV (postoperative nausea and vomiting)   ? Seizure disorder ([place])   ? Shortness of breath dyspnea   ? with exdrtion, "Walkling too fast"  ? Stroke [facility])   ? no residual  ? ? ?MEDICATIONS: ?Current Outpatient Medications on File Prior to Visit  ?Medication Sig Dispense Refill  ? acetaminophen (TYLENOL) 325 MG tablet Take 2 tablets (650 mg total) by mouth every 6 (six) hours as needed for mild pain (or Fever >/= 101).    ? amLODipine (NORVASC) 5 MG tablet Take 5 mg by mouth daily.    ? aspirin 325 MG EC tablet Take 1 tablet (325 mg total) by mouth daily.    ? atorvastatin (LIPITOR) 20 MG tablet Take 1 tablet by mouth once daily (Patient taking differently: Take 20 mg by mouth daily.) 90 tablet 0  ? bisacodyl (DULCOLAX) 5 MG EC tablet Take 5 mg by mouth daily as needed for moderate constipation.    ? Blood Glucose Monitoring Suppl (FREESTYLE FREEDOM) KIT Use to check blood sugar once a day dx code 1 kit 1  ? cilostazol (PLETAL) 100 MG tablet Take 1 tablet by mouth twice daily 180 tablet 0  ? docusate sodium (COLACE) 100 MG capsule Take 200 mg by mouth daily as needed for mild constipation.    ? Doxercalciferol (HECTOROL IV) Inject 1 Dose into the vein See admin instructions. During dialysis - Tuesday, Thursday and Saturday    ? gabapentin (NEURONTIN) 100 MG capsule TAKE 1 CAPSULE BY MOUTH TWICE DAILY AS NEEDED (Patient taking differently: 100 mg 2 (two) times daily as needed (neuropathy).) 180 capsule 2  ? glucose blood (FREESTYLE LITE) test strip USE AS INSTRUCTED TO CHECK BLOOD SUGAR ONCE DAILY. 50 each 3   ? heparin 1000 unit/mL SOLN injection 1,000 Units by Dialysis route See admin instructions. During dialysis    ? insulin glargine (LANTUS) 100 UNIT/ML injection Inject 0.12 mLs (12 Units total) into the skin daily.    ? Insulin Pen Needle 31G X 5 MM MISC Use with pen 100 each 1  ? insulin regular (NOVOLIN R) 100 units/mL injection Inject 7-10 Units into the skin See admin instructions. Taking 10 units with breakfast and 7 units with dinner    ? iron sucrose in sodium chloride 0.9 % 100 mL Inject 100 mg into the vein See admin instructions. During dialysis    ? lamoTRIgine (LAMICTAL) 200 MG tablet Take 1 tablet twice a day (Patient taking differently: Take 200 mg by mouth 2 (two) times daily.) 180 tablet 3  ? lidocaine-prilocaine (EMLA) cream 1 application. daily as needed (port access).    ? Methoxy PEG-Epoetin Beta (MIRCERA IJ) Inject 1 Dose into the vein See admin instructions. During dialysis    ? pantoprazole (PROTONIX) 40 MG tablet Take 1 tablet (40 mg)  by mouth twice daily for 4 weeks, then switch to 1 tablet once daily. (Patient taking differently: Take 40 mg by mouth daily.) 60 tablet 3  ? triamcinolone ointment (KENALOG) 0.1 % 1 application. daily.    ? Vitamin D, Ergocalciferol, (DRISDOL) 1.25 MG (50000 UNIT) CAPS capsule Take 1 capsule by mouth once a week (Patient taking differently: Provided at dialysis) 12 capsule 0  ? insulin glargine-yfgn (SEMGLEE) 100 UNIT/ML injection INJECT 12 UNITS SUBCUTANEOUSLY ONCE DAILY IN THE MORNING (Patient not taking: Reported on 05/10/2021) 20 mL 0  ? ?No current facility-administered medications on file prior to visit.  ? ? ?ALLERGIES: ?Allergies  ?Allergen Reactions  ? Morphine And Related Other (See Comments)  ?  Hallucinations   ? Penicillins Swelling and Rash  ?  Throat swelling ?Did it involve swelling of the face/tongue/throat, SOB, or low BP? Yes ?Did it involve sudden or severe rash/hives, skin peeling, or any reaction on the inside of your mouth or nose?  Yes ?Did you need to seek medical attention at a hospital or doctor's office? Yes ?When did it last happen?   young child    ?If all above answers are ?NO?, may proceed with cephalosporin use.  ? ? ?FAMILY

## 2021-05-15 NOTE — Patient Instructions (Addendum)
Good to see you. ? ?Start weaning off the Lamotrigine (Lamictal):  ?Take '100mg'$  in AM, '200mg'$  in PM for 1 week,  ?Then reduce to '100mg'$  twice a day for 1 week, ?Then reduce to '100mg'$  once a day for 1 week, then stop ? ?2. Please discuss ways of smoking cessation with PCP ? ?3. Continue with physical therapy for strengthening exercises, continue fall precautions ? ?4. Follow-up in 3 months, call for any changes ?

## 2021-05-16 DIAGNOSIS — E1129 Type 2 diabetes mellitus with other diabetic kidney complication: Secondary | ICD-10-CM | POA: Diagnosis not present

## 2021-05-16 DIAGNOSIS — I5032 Chronic diastolic (congestive) heart failure: Secondary | ICD-10-CM | POA: Diagnosis not present

## 2021-05-16 DIAGNOSIS — N186 End stage renal disease: Secondary | ICD-10-CM | POA: Diagnosis not present

## 2021-05-16 DIAGNOSIS — E1122 Type 2 diabetes mellitus with diabetic chronic kidney disease: Secondary | ICD-10-CM | POA: Diagnosis not present

## 2021-05-16 DIAGNOSIS — Z992 Dependence on renal dialysis: Secondary | ICD-10-CM | POA: Diagnosis not present

## 2021-05-16 DIAGNOSIS — E785 Hyperlipidemia, unspecified: Secondary | ICD-10-CM | POA: Diagnosis not present

## 2021-05-16 DIAGNOSIS — I132 Hypertensive heart and chronic kidney disease with heart failure and with stage 5 chronic kidney disease, or end stage renal disease: Secondary | ICD-10-CM | POA: Diagnosis not present

## 2021-05-16 DIAGNOSIS — M1990 Unspecified osteoarthritis, unspecified site: Secondary | ICD-10-CM | POA: Diagnosis not present

## 2021-05-16 DIAGNOSIS — N2581 Secondary hyperparathyroidism of renal origin: Secondary | ICD-10-CM | POA: Diagnosis not present

## 2021-05-17 DIAGNOSIS — N186 End stage renal disease: Secondary | ICD-10-CM | POA: Diagnosis not present

## 2021-05-17 DIAGNOSIS — I5032 Chronic diastolic (congestive) heart failure: Secondary | ICD-10-CM | POA: Diagnosis not present

## 2021-05-17 DIAGNOSIS — E785 Hyperlipidemia, unspecified: Secondary | ICD-10-CM | POA: Diagnosis not present

## 2021-05-17 DIAGNOSIS — I132 Hypertensive heart and chronic kidney disease with heart failure and with stage 5 chronic kidney disease, or end stage renal disease: Secondary | ICD-10-CM | POA: Diagnosis not present

## 2021-05-17 DIAGNOSIS — E1122 Type 2 diabetes mellitus with diabetic chronic kidney disease: Secondary | ICD-10-CM | POA: Diagnosis not present

## 2021-05-17 DIAGNOSIS — M1990 Unspecified osteoarthritis, unspecified site: Secondary | ICD-10-CM | POA: Diagnosis not present

## 2021-05-18 ENCOUNTER — Other Ambulatory Visit (HOSPITAL_COMMUNITY): Payer: Self-pay

## 2021-05-18 DIAGNOSIS — E1129 Type 2 diabetes mellitus with other diabetic kidney complication: Secondary | ICD-10-CM | POA: Diagnosis not present

## 2021-05-18 DIAGNOSIS — I5032 Chronic diastolic (congestive) heart failure: Secondary | ICD-10-CM | POA: Diagnosis not present

## 2021-05-18 DIAGNOSIS — E1122 Type 2 diabetes mellitus with diabetic chronic kidney disease: Secondary | ICD-10-CM | POA: Diagnosis not present

## 2021-05-18 DIAGNOSIS — E785 Hyperlipidemia, unspecified: Secondary | ICD-10-CM | POA: Diagnosis not present

## 2021-05-18 DIAGNOSIS — I132 Hypertensive heart and chronic kidney disease with heart failure and with stage 5 chronic kidney disease, or end stage renal disease: Secondary | ICD-10-CM | POA: Diagnosis not present

## 2021-05-18 DIAGNOSIS — N2581 Secondary hyperparathyroidism of renal origin: Secondary | ICD-10-CM | POA: Diagnosis not present

## 2021-05-18 DIAGNOSIS — Z992 Dependence on renal dialysis: Secondary | ICD-10-CM | POA: Diagnosis not present

## 2021-05-18 DIAGNOSIS — N186 End stage renal disease: Secondary | ICD-10-CM | POA: Diagnosis not present

## 2021-05-18 DIAGNOSIS — M1990 Unspecified osteoarthritis, unspecified site: Secondary | ICD-10-CM | POA: Diagnosis not present

## 2021-05-19 DIAGNOSIS — N186 End stage renal disease: Secondary | ICD-10-CM | POA: Diagnosis not present

## 2021-05-19 DIAGNOSIS — I132 Hypertensive heart and chronic kidney disease with heart failure and with stage 5 chronic kidney disease, or end stage renal disease: Secondary | ICD-10-CM | POA: Diagnosis not present

## 2021-05-19 DIAGNOSIS — E1122 Type 2 diabetes mellitus with diabetic chronic kidney disease: Secondary | ICD-10-CM | POA: Diagnosis not present

## 2021-05-19 DIAGNOSIS — I5032 Chronic diastolic (congestive) heart failure: Secondary | ICD-10-CM | POA: Diagnosis not present

## 2021-05-19 DIAGNOSIS — E785 Hyperlipidemia, unspecified: Secondary | ICD-10-CM | POA: Diagnosis not present

## 2021-05-19 DIAGNOSIS — M1990 Unspecified osteoarthritis, unspecified site: Secondary | ICD-10-CM | POA: Diagnosis not present

## 2021-05-20 DIAGNOSIS — N186 End stage renal disease: Secondary | ICD-10-CM | POA: Diagnosis not present

## 2021-05-20 DIAGNOSIS — N2581 Secondary hyperparathyroidism of renal origin: Secondary | ICD-10-CM | POA: Diagnosis not present

## 2021-05-20 DIAGNOSIS — E1129 Type 2 diabetes mellitus with other diabetic kidney complication: Secondary | ICD-10-CM | POA: Diagnosis not present

## 2021-05-20 DIAGNOSIS — Z992 Dependence on renal dialysis: Secondary | ICD-10-CM | POA: Diagnosis not present

## 2021-05-22 DIAGNOSIS — E785 Hyperlipidemia, unspecified: Secondary | ICD-10-CM | POA: Diagnosis not present

## 2021-05-22 DIAGNOSIS — E1122 Type 2 diabetes mellitus with diabetic chronic kidney disease: Secondary | ICD-10-CM | POA: Diagnosis not present

## 2021-05-22 DIAGNOSIS — M1990 Unspecified osteoarthritis, unspecified site: Secondary | ICD-10-CM | POA: Diagnosis not present

## 2021-05-22 DIAGNOSIS — I132 Hypertensive heart and chronic kidney disease with heart failure and with stage 5 chronic kidney disease, or end stage renal disease: Secondary | ICD-10-CM | POA: Diagnosis not present

## 2021-05-22 DIAGNOSIS — N186 End stage renal disease: Secondary | ICD-10-CM | POA: Diagnosis not present

## 2021-05-22 DIAGNOSIS — I5032 Chronic diastolic (congestive) heart failure: Secondary | ICD-10-CM | POA: Diagnosis not present

## 2021-05-23 DIAGNOSIS — Z992 Dependence on renal dialysis: Secondary | ICD-10-CM | POA: Diagnosis not present

## 2021-05-23 DIAGNOSIS — M1990 Unspecified osteoarthritis, unspecified site: Secondary | ICD-10-CM | POA: Diagnosis not present

## 2021-05-23 DIAGNOSIS — E785 Hyperlipidemia, unspecified: Secondary | ICD-10-CM | POA: Diagnosis not present

## 2021-05-23 DIAGNOSIS — I5032 Chronic diastolic (congestive) heart failure: Secondary | ICD-10-CM | POA: Diagnosis not present

## 2021-05-23 DIAGNOSIS — E1129 Type 2 diabetes mellitus with other diabetic kidney complication: Secondary | ICD-10-CM | POA: Diagnosis not present

## 2021-05-23 DIAGNOSIS — N2581 Secondary hyperparathyroidism of renal origin: Secondary | ICD-10-CM | POA: Diagnosis not present

## 2021-05-23 DIAGNOSIS — I132 Hypertensive heart and chronic kidney disease with heart failure and with stage 5 chronic kidney disease, or end stage renal disease: Secondary | ICD-10-CM | POA: Diagnosis not present

## 2021-05-23 DIAGNOSIS — E1122 Type 2 diabetes mellitus with diabetic chronic kidney disease: Secondary | ICD-10-CM | POA: Diagnosis not present

## 2021-05-23 DIAGNOSIS — N186 End stage renal disease: Secondary | ICD-10-CM | POA: Diagnosis not present

## 2021-05-24 ENCOUNTER — Other Ambulatory Visit: Payer: Medicare Other

## 2021-05-24 DIAGNOSIS — N186 End stage renal disease: Secondary | ICD-10-CM | POA: Diagnosis not present

## 2021-05-24 DIAGNOSIS — I5032 Chronic diastolic (congestive) heart failure: Secondary | ICD-10-CM | POA: Diagnosis not present

## 2021-05-24 DIAGNOSIS — E785 Hyperlipidemia, unspecified: Secondary | ICD-10-CM | POA: Diagnosis not present

## 2021-05-24 DIAGNOSIS — E1122 Type 2 diabetes mellitus with diabetic chronic kidney disease: Secondary | ICD-10-CM | POA: Diagnosis not present

## 2021-05-24 DIAGNOSIS — I132 Hypertensive heart and chronic kidney disease with heart failure and with stage 5 chronic kidney disease, or end stage renal disease: Secondary | ICD-10-CM | POA: Diagnosis not present

## 2021-05-24 DIAGNOSIS — M1990 Unspecified osteoarthritis, unspecified site: Secondary | ICD-10-CM | POA: Diagnosis not present

## 2021-05-25 DIAGNOSIS — I132 Hypertensive heart and chronic kidney disease with heart failure and with stage 5 chronic kidney disease, or end stage renal disease: Secondary | ICD-10-CM | POA: Diagnosis not present

## 2021-05-25 DIAGNOSIS — N186 End stage renal disease: Secondary | ICD-10-CM | POA: Diagnosis not present

## 2021-05-25 DIAGNOSIS — N2581 Secondary hyperparathyroidism of renal origin: Secondary | ICD-10-CM | POA: Diagnosis not present

## 2021-05-25 DIAGNOSIS — E1129 Type 2 diabetes mellitus with other diabetic kidney complication: Secondary | ICD-10-CM | POA: Diagnosis not present

## 2021-05-25 DIAGNOSIS — M1990 Unspecified osteoarthritis, unspecified site: Secondary | ICD-10-CM | POA: Diagnosis not present

## 2021-05-25 DIAGNOSIS — E1122 Type 2 diabetes mellitus with diabetic chronic kidney disease: Secondary | ICD-10-CM | POA: Diagnosis not present

## 2021-05-25 DIAGNOSIS — Z992 Dependence on renal dialysis: Secondary | ICD-10-CM | POA: Diagnosis not present

## 2021-05-25 DIAGNOSIS — I5032 Chronic diastolic (congestive) heart failure: Secondary | ICD-10-CM | POA: Diagnosis not present

## 2021-05-25 DIAGNOSIS — E785 Hyperlipidemia, unspecified: Secondary | ICD-10-CM | POA: Diagnosis not present

## 2021-05-26 ENCOUNTER — Ambulatory Visit: Payer: Medicare Other | Admitting: Endocrinology

## 2021-05-26 DIAGNOSIS — E785 Hyperlipidemia, unspecified: Secondary | ICD-10-CM | POA: Diagnosis not present

## 2021-05-26 DIAGNOSIS — N186 End stage renal disease: Secondary | ICD-10-CM | POA: Diagnosis not present

## 2021-05-26 DIAGNOSIS — E1122 Type 2 diabetes mellitus with diabetic chronic kidney disease: Secondary | ICD-10-CM | POA: Diagnosis not present

## 2021-05-26 DIAGNOSIS — M1990 Unspecified osteoarthritis, unspecified site: Secondary | ICD-10-CM | POA: Diagnosis not present

## 2021-05-26 DIAGNOSIS — I132 Hypertensive heart and chronic kidney disease with heart failure and with stage 5 chronic kidney disease, or end stage renal disease: Secondary | ICD-10-CM | POA: Diagnosis not present

## 2021-05-26 DIAGNOSIS — I5032 Chronic diastolic (congestive) heart failure: Secondary | ICD-10-CM | POA: Diagnosis not present

## 2021-05-27 DIAGNOSIS — Z992 Dependence on renal dialysis: Secondary | ICD-10-CM | POA: Diagnosis not present

## 2021-05-27 DIAGNOSIS — E1129 Type 2 diabetes mellitus with other diabetic kidney complication: Secondary | ICD-10-CM | POA: Diagnosis not present

## 2021-05-27 DIAGNOSIS — N2581 Secondary hyperparathyroidism of renal origin: Secondary | ICD-10-CM | POA: Diagnosis not present

## 2021-05-27 DIAGNOSIS — N186 End stage renal disease: Secondary | ICD-10-CM | POA: Diagnosis not present

## 2021-05-29 ENCOUNTER — Other Ambulatory Visit (INDEPENDENT_AMBULATORY_CARE_PROVIDER_SITE_OTHER): Payer: Medicare Other

## 2021-05-29 DIAGNOSIS — N186 End stage renal disease: Secondary | ICD-10-CM | POA: Diagnosis not present

## 2021-05-29 DIAGNOSIS — E1165 Type 2 diabetes mellitus with hyperglycemia: Secondary | ICD-10-CM

## 2021-05-29 DIAGNOSIS — E782 Mixed hyperlipidemia: Secondary | ICD-10-CM

## 2021-05-29 DIAGNOSIS — Z794 Long term (current) use of insulin: Secondary | ICD-10-CM

## 2021-05-29 DIAGNOSIS — E1122 Type 2 diabetes mellitus with diabetic chronic kidney disease: Secondary | ICD-10-CM | POA: Diagnosis not present

## 2021-05-29 DIAGNOSIS — Z992 Dependence on renal dialysis: Secondary | ICD-10-CM | POA: Diagnosis not present

## 2021-05-29 LAB — LIPID PANEL
Cholesterol: 146 mg/dL (ref 0–200)
HDL: 67.4 mg/dL (ref >39.00)
LDL Cholesterol: 58 mg/dL (ref 0–99)
NonHDL: 78.31
Total CHOL/HDL Ratio: 2
Triglycerides: 100 mg/dL (ref 0.0–149.0)
VLDL: 20 mg/dL (ref 0.0–40.0)

## 2021-05-29 LAB — HEMOGLOBIN A1C: Hgb A1c MFr Bld: 4.8 % (ref 4.6–6.5)

## 2021-05-29 LAB — GLUCOSE, RANDOM: Glucose, Bld: 111 mg/dL — ABNORMAL HIGH (ref 70–99)

## 2021-05-30 DIAGNOSIS — E1122 Type 2 diabetes mellitus with diabetic chronic kidney disease: Secondary | ICD-10-CM | POA: Diagnosis not present

## 2021-05-30 DIAGNOSIS — G459 Transient cerebral ischemic attack, unspecified: Secondary | ICD-10-CM | POA: Diagnosis not present

## 2021-05-30 DIAGNOSIS — Z992 Dependence on renal dialysis: Secondary | ICD-10-CM | POA: Diagnosis not present

## 2021-05-30 DIAGNOSIS — K922 Gastrointestinal hemorrhage, unspecified: Secondary | ICD-10-CM | POA: Diagnosis not present

## 2021-05-30 DIAGNOSIS — E1142 Type 2 diabetes mellitus with diabetic polyneuropathy: Secondary | ICD-10-CM | POA: Diagnosis not present

## 2021-05-30 DIAGNOSIS — D62 Acute posthemorrhagic anemia: Secondary | ICD-10-CM | POA: Diagnosis not present

## 2021-05-30 DIAGNOSIS — K3184 Gastroparesis: Secondary | ICD-10-CM | POA: Diagnosis not present

## 2021-05-30 DIAGNOSIS — Z8616 Personal history of COVID-19: Secondary | ICD-10-CM | POA: Diagnosis not present

## 2021-05-30 DIAGNOSIS — E1129 Type 2 diabetes mellitus with other diabetic kidney complication: Secondary | ICD-10-CM | POA: Diagnosis not present

## 2021-05-30 DIAGNOSIS — N186 End stage renal disease: Secondary | ICD-10-CM | POA: Diagnosis not present

## 2021-05-30 DIAGNOSIS — I70212 Atherosclerosis of native arteries of extremities with intermittent claudication, left leg: Secondary | ICD-10-CM | POA: Diagnosis not present

## 2021-05-30 DIAGNOSIS — H548 Legal blindness, as defined in USA: Secondary | ICD-10-CM | POA: Diagnosis not present

## 2021-05-30 DIAGNOSIS — D509 Iron deficiency anemia, unspecified: Secondary | ICD-10-CM | POA: Diagnosis not present

## 2021-05-30 DIAGNOSIS — E1143 Type 2 diabetes mellitus with diabetic autonomic (poly)neuropathy: Secondary | ICD-10-CM | POA: Diagnosis not present

## 2021-05-30 DIAGNOSIS — E785 Hyperlipidemia, unspecified: Secondary | ICD-10-CM | POA: Diagnosis not present

## 2021-05-30 DIAGNOSIS — R413 Other amnesia: Secondary | ICD-10-CM | POA: Diagnosis not present

## 2021-05-30 DIAGNOSIS — I132 Hypertensive heart and chronic kidney disease with heart failure and with stage 5 chronic kidney disease, or end stage renal disease: Secondary | ICD-10-CM | POA: Diagnosis not present

## 2021-05-30 DIAGNOSIS — E1151 Type 2 diabetes mellitus with diabetic peripheral angiopathy without gangrene: Secondary | ICD-10-CM | POA: Diagnosis not present

## 2021-05-30 DIAGNOSIS — R1310 Dysphagia, unspecified: Secondary | ICD-10-CM | POA: Diagnosis not present

## 2021-05-30 DIAGNOSIS — N2581 Secondary hyperparathyroidism of renal origin: Secondary | ICD-10-CM | POA: Diagnosis not present

## 2021-05-30 DIAGNOSIS — Z8673 Personal history of transient ischemic attack (TIA), and cerebral infarction without residual deficits: Secondary | ICD-10-CM | POA: Diagnosis not present

## 2021-05-30 DIAGNOSIS — M1990 Unspecified osteoarthritis, unspecified site: Secondary | ICD-10-CM | POA: Diagnosis not present

## 2021-05-30 DIAGNOSIS — I5032 Chronic diastolic (congestive) heart failure: Secondary | ICD-10-CM | POA: Diagnosis not present

## 2021-05-30 LAB — FRUCTOSAMINE: Fructosamine: 282 umol/L (ref 0–285)

## 2021-05-31 ENCOUNTER — Encounter: Payer: Medicare Other | Admitting: Endocrinology

## 2021-05-31 ENCOUNTER — Encounter: Payer: Self-pay | Admitting: Endocrinology

## 2021-05-31 DIAGNOSIS — N186 End stage renal disease: Secondary | ICD-10-CM | POA: Diagnosis not present

## 2021-05-31 DIAGNOSIS — E1122 Type 2 diabetes mellitus with diabetic chronic kidney disease: Secondary | ICD-10-CM | POA: Diagnosis not present

## 2021-05-31 DIAGNOSIS — I132 Hypertensive heart and chronic kidney disease with heart failure and with stage 5 chronic kidney disease, or end stage renal disease: Secondary | ICD-10-CM | POA: Diagnosis not present

## 2021-05-31 DIAGNOSIS — I5032 Chronic diastolic (congestive) heart failure: Secondary | ICD-10-CM | POA: Diagnosis not present

## 2021-05-31 DIAGNOSIS — E785 Hyperlipidemia, unspecified: Secondary | ICD-10-CM | POA: Diagnosis not present

## 2021-05-31 DIAGNOSIS — M1990 Unspecified osteoarthritis, unspecified site: Secondary | ICD-10-CM | POA: Diagnosis not present

## 2021-05-31 NOTE — Progress Notes (Signed)
This encounter was created in error - please disregard.

## 2021-06-01 DIAGNOSIS — E1129 Type 2 diabetes mellitus with other diabetic kidney complication: Secondary | ICD-10-CM | POA: Diagnosis not present

## 2021-06-01 DIAGNOSIS — D509 Iron deficiency anemia, unspecified: Secondary | ICD-10-CM | POA: Diagnosis not present

## 2021-06-01 DIAGNOSIS — M1990 Unspecified osteoarthritis, unspecified site: Secondary | ICD-10-CM | POA: Diagnosis not present

## 2021-06-01 DIAGNOSIS — N186 End stage renal disease: Secondary | ICD-10-CM | POA: Diagnosis not present

## 2021-06-01 DIAGNOSIS — I5032 Chronic diastolic (congestive) heart failure: Secondary | ICD-10-CM | POA: Diagnosis not present

## 2021-06-01 DIAGNOSIS — E785 Hyperlipidemia, unspecified: Secondary | ICD-10-CM | POA: Diagnosis not present

## 2021-06-01 DIAGNOSIS — I132 Hypertensive heart and chronic kidney disease with heart failure and with stage 5 chronic kidney disease, or end stage renal disease: Secondary | ICD-10-CM | POA: Diagnosis not present

## 2021-06-01 DIAGNOSIS — E1122 Type 2 diabetes mellitus with diabetic chronic kidney disease: Secondary | ICD-10-CM | POA: Diagnosis not present

## 2021-06-01 DIAGNOSIS — N2581 Secondary hyperparathyroidism of renal origin: Secondary | ICD-10-CM | POA: Diagnosis not present

## 2021-06-01 DIAGNOSIS — Z992 Dependence on renal dialysis: Secondary | ICD-10-CM | POA: Diagnosis not present

## 2021-06-03 DIAGNOSIS — Z992 Dependence on renal dialysis: Secondary | ICD-10-CM | POA: Diagnosis not present

## 2021-06-03 DIAGNOSIS — E1129 Type 2 diabetes mellitus with other diabetic kidney complication: Secondary | ICD-10-CM | POA: Diagnosis not present

## 2021-06-03 DIAGNOSIS — N186 End stage renal disease: Secondary | ICD-10-CM | POA: Diagnosis not present

## 2021-06-03 DIAGNOSIS — N2581 Secondary hyperparathyroidism of renal origin: Secondary | ICD-10-CM | POA: Diagnosis not present

## 2021-06-03 DIAGNOSIS — D509 Iron deficiency anemia, unspecified: Secondary | ICD-10-CM | POA: Diagnosis not present

## 2021-06-06 DIAGNOSIS — N2581 Secondary hyperparathyroidism of renal origin: Secondary | ICD-10-CM | POA: Diagnosis not present

## 2021-06-06 DIAGNOSIS — E1129 Type 2 diabetes mellitus with other diabetic kidney complication: Secondary | ICD-10-CM | POA: Diagnosis not present

## 2021-06-06 DIAGNOSIS — Z992 Dependence on renal dialysis: Secondary | ICD-10-CM | POA: Diagnosis not present

## 2021-06-06 DIAGNOSIS — N186 End stage renal disease: Secondary | ICD-10-CM | POA: Diagnosis not present

## 2021-06-06 DIAGNOSIS — D509 Iron deficiency anemia, unspecified: Secondary | ICD-10-CM | POA: Diagnosis not present

## 2021-06-08 ENCOUNTER — Ambulatory Visit: Payer: Medicare Other | Admitting: *Deleted

## 2021-06-08 DIAGNOSIS — N186 End stage renal disease: Secondary | ICD-10-CM | POA: Diagnosis not present

## 2021-06-08 DIAGNOSIS — N2581 Secondary hyperparathyroidism of renal origin: Secondary | ICD-10-CM | POA: Diagnosis not present

## 2021-06-08 DIAGNOSIS — D509 Iron deficiency anemia, unspecified: Secondary | ICD-10-CM | POA: Diagnosis not present

## 2021-06-08 DIAGNOSIS — E1129 Type 2 diabetes mellitus with other diabetic kidney complication: Secondary | ICD-10-CM | POA: Diagnosis not present

## 2021-06-08 DIAGNOSIS — Z992 Dependence on renal dialysis: Secondary | ICD-10-CM | POA: Diagnosis not present

## 2021-06-09 ENCOUNTER — Other Ambulatory Visit: Payer: Self-pay | Admitting: *Deleted

## 2021-06-09 DIAGNOSIS — I132 Hypertensive heart and chronic kidney disease with heart failure and with stage 5 chronic kidney disease, or end stage renal disease: Secondary | ICD-10-CM | POA: Diagnosis not present

## 2021-06-09 DIAGNOSIS — I5032 Chronic diastolic (congestive) heart failure: Secondary | ICD-10-CM | POA: Diagnosis not present

## 2021-06-09 DIAGNOSIS — N186 End stage renal disease: Secondary | ICD-10-CM | POA: Diagnosis not present

## 2021-06-09 DIAGNOSIS — M1990 Unspecified osteoarthritis, unspecified site: Secondary | ICD-10-CM | POA: Diagnosis not present

## 2021-06-09 DIAGNOSIS — E785 Hyperlipidemia, unspecified: Secondary | ICD-10-CM | POA: Diagnosis not present

## 2021-06-09 DIAGNOSIS — E1122 Type 2 diabetes mellitus with diabetic chronic kidney disease: Secondary | ICD-10-CM | POA: Diagnosis not present

## 2021-06-09 NOTE — Patient Outreach (Signed)
[facility]) Care Management Telephonic RN Care Manager Note   06/09/2021 Name:  Julie Brewer MRN:  [id number] DOB:  04-06-1950  Summary: Pt doing well with no acute issues and continue weekly dialysis. Remains in the GREEN zone with her HF with reported systems.  Recommendations/Changes made from today's visit: Will continue to encourage adheres concerning the discussed plan of care.   Subjective: Julie Brewer is an 71 y.o. year old female who is a primary patient of Chanetta Marshall, Meridee Score, MD. The care management team was consulted for assistance with care management and/or care coordination needs.    Telephonic RN Care Manager completed Telephone Visit today.  Objective:   Medications Reviewed Today     Reviewed by Eliseo Squires, CMA (Certified Medical Assistant) on 05/31/21 at 1024  Med List Status: <None>   Medication Order Taking? Sig Documenting Provider Last Dose Status Informant  acetaminophen (TYLENOL) 325 MG tablet [id number] Yes Take 2 tablets (650 mg total) by mouth every 6 (six) hours as needed for mild pain (or Fever >/= 101). Zannie Cove, MD Taking Active Child  amLODipine (NORVASC) 5 MG tablet [id number] Yes Take 5 mg by mouth daily. [provider] Taking Active Child  aspirin 325 MG EC tablet [id number] Yes Take 1 tablet (325 mg total) by mouth daily. Maretta Bees, MD Taking Active Child  atorvastatin (LIPITOR) 20 MG tablet [id number] Yes Take 1 tablet by mouth once daily  Patient taking differently: Take 20 mg by mouth daily.   Reather Littler, MD Taking Active Child  bisacodyl (DULCOLAX) 5 MG EC tablet [id number] Yes Take 5 mg by mouth daily as needed for moderate constipation. [provider] Taking Active Child  Blood Glucose Monitoring Suppl (FREESTYLE FREEDOM) KIT [id number] Yes Use to check blood sugar once a day dx code Reather Littler, MD Taking Active Child  cilostazol (PLETAL) 100 MG tablet [id number] Yes  Take 1 tablet by mouth twice daily Chuck Hint, MD Taking Active   docusate sodium (COLACE) 100 MG capsule [id number] Yes Take 200 mg by mouth daily as needed for mild constipation. [provider] Taking Active Child  Doxercalciferol (HECTOROL IV) [id number] Yes Inject 1 Dose into the vein See admin instructions. During dialysis - Tuesday, Thursday and Saturday [provider] Taking Active Child  gabapentin (NEURONTIN) 100 MG capsule [id number] Yes TAKE 1 CAPSULE BY MOUTH TWICE DAILY AS NEEDED  Patient taking differently: 100 mg 2 (two) times daily as needed (neuropathy).   Reather Littler, MD Taking Active Child  glucose blood (FREESTYLE LITE) test strip [id number] Yes USE AS INSTRUCTED TO CHECK BLOOD SUGAR ONCE DAILY. Reather Littler, MD Taking Active Child  heparin 1000 unit/mL SOLN injection [id number] Yes 1,000 Units by Dialysis route See admin instructions. During dialysis [provider] Taking Active Child  insulin glargine (LANTUS) 100 UNIT/ML injection [id number] Yes Inject 0.12 mLs (12 Units total) into the skin daily. Maretta Bees, MD Taking Active Child  insulin glargine-yfgn (SEMGLEE) 100 UNIT/ML injection [id number] No INJECT 12 UNITS SUBCUTANEOUSLY ONCE DAILY IN THE MORNING  Patient not taking: Reported on 05/10/2021   Reather Littler, MD Not Taking Active   Insulin Pen Needle 31G X 5 MM MISC [id number] Yes Use with pen Reather Littler, MD Taking Active Child  insulin regular (NOVOLIN R) 100 units/mL injection [id number] Yes Inject 7-10 Units into the skin See admin instructions. Taking 8 units with breakfast and 7 units with dinner [provider] Taking Active Child  iron sucrose  in sodium chloride 0.9 % 100 mL [id number] Yes Inject 100 mg into the vein See admin instructions. During dialysis [provider] Taking Active Child  lidocaine-prilocaine (EMLA) cream [id number] Yes 1 application. daily as needed (port access). [provider]  Taking Active Child  Methoxy PEG-Epoetin Beta (MIRCERA IJ) [id number] Yes Inject 1 Dose into the vein See admin instructions. During dialysis [provider] Taking Active Child  pantoprazole (PROTONIX) 40 MG tablet [id number] Yes Take 1 tablet (40 mg) by mouth twice daily for 4 weeks, then switch to 1 tablet once daily.  Patient taking differently: Take 40 mg by mouth daily.   Maretta Bees, MD Taking Active Child  triamcinolone ointment (KENALOG) 0.1 % [id number] Yes 1 application. daily. [provider] Taking Active Child  Vitamin D, Ergocalciferol, (DRISDOL) 1.25 MG (50000 UNIT) CAPS capsule [id number] Yes Take 1 capsule by mouth once a week  Patient taking differently: Provided at dialysis   Reather Littler, MD Taking Active Child           Med Note Jomarie Longs, [facility]   Fri Apr 07, 2021  4:10 PM)               SDOH:  (Social Determinants of Health) assessments and interventions performed:     Care Plan  Review of patient past medical history, allergies, medications, health status, including review of consultants reports, laboratory and other test data, was performed as part of comprehensive evaluation for care management services.   Care Plan : RN Care manager plan of care  Updates made by Alejandro Mulling, RN since 06/09/2021 12:00 AM     Problem: Knowledge Deficit related to CHF and care coordination needs   Priority: High     Long-Range Goal: Development plan of care fore management of CHF   Start Date: 05/10/2021  Expected End Date: 11/28/2021  This Visit's Progress: On track  Priority: High  Note:   Current Barriers:  Knowledge Deficits related to plan of care for management of CHF   RNCM Clinical Goal(s):  Patient will verbalize basic understanding of  CHF disease process and self health management plan as evidenced by Self reporting and chart review take all medications exactly as prescribed and will call provider for medication related questions  as evidenced by self reporting and chart review  through collaboration with RN Care manager, provider, and care team.   Interventions: Inter-disciplinary care team collaboration (see longitudinal plan of care) Evaluation of current treatment plan related to  self management and patient's adherence to plan as established by provider   Heart Failure Interventions:  (Status:  New goal.) Long Term Goal Basic overview and discussion of pathophysiology of Heart Failure reviewed Provided education on low sodium diet Reviewed Heart Failure Action Plan in depth and provided written copy Assessed need for readable accurate scales in home Provided education about placing scale on hard, flat surface Discussed importance of daily weight and advised patient to weigh and record daily Discussed the importance of keeping all appointments with provider Screening for signs and symptoms of depression related to chronic disease state   06/09/2021 Update: Daughter Blima Ledger  has provided reported today indicating pt remains with [facility] for OT services all other services completed. Pt continue to weight at baseline 131 lbs (no swelling and remains in the GREEN zone) and has dialysis on T/TH/Sat with no reported delays or issues. Pt doing with and adherent to the discussed plan of care related to sufficient transportation to all medical  appointments and medication administration. No HF medications due to dialysis with removal of added fluid at this time. Great support system and lives with her daughter and grandchildren. No issues reported with use of her ongoing Libra (blind).  Patient Goals/Self-Care Activities: Take all medications as prescribed Attend all scheduled provider appointments Call pharmacy for medication refills 3-7 days in advance of running out of medications Attend church or other social activities Perform all self care activities independently  Perform IADL's (shopping, preparing meals,  housekeeping, managing finances) independently Call provider office for new concerns or questions  call office if I gain more than 2 pounds in one day or 5 pounds in one week do ankle pumps when sitting keep legs up while sitting track weight in diary use salt in moderation watch for swelling in feet, ankles and legs every day bring diary to all appointments develop a rescue plan follow rescue plan if symptoms flare-up know when to call the doctor:ongoign swelling that is not resolved after dialysis track symptoms and what helps feel better or worse Document weekly weights from the dialysis center for HF monitoring  Follow Up Plan:  Telephone follow up appointment with care management team member scheduled for:  June 2023 The patient has been provided with contact information for the care management team and has been advised to call with any health related questions or concerns.       Elliot Cousin, RN Care Management Coordinator [facility] Main Office [phone]

## 2021-06-10 DIAGNOSIS — N2581 Secondary hyperparathyroidism of renal origin: Secondary | ICD-10-CM | POA: Diagnosis not present

## 2021-06-10 DIAGNOSIS — E1129 Type 2 diabetes mellitus with other diabetic kidney complication: Secondary | ICD-10-CM | POA: Diagnosis not present

## 2021-06-10 DIAGNOSIS — Z992 Dependence on renal dialysis: Secondary | ICD-10-CM | POA: Diagnosis not present

## 2021-06-10 DIAGNOSIS — D509 Iron deficiency anemia, unspecified: Secondary | ICD-10-CM | POA: Diagnosis not present

## 2021-06-10 DIAGNOSIS — N186 End stage renal disease: Secondary | ICD-10-CM | POA: Diagnosis not present

## 2021-06-13 DIAGNOSIS — Z992 Dependence on renal dialysis: Secondary | ICD-10-CM | POA: Diagnosis not present

## 2021-06-13 DIAGNOSIS — D509 Iron deficiency anemia, unspecified: Secondary | ICD-10-CM | POA: Diagnosis not present

## 2021-06-13 DIAGNOSIS — N186 End stage renal disease: Secondary | ICD-10-CM | POA: Diagnosis not present

## 2021-06-13 DIAGNOSIS — E1129 Type 2 diabetes mellitus with other diabetic kidney complication: Secondary | ICD-10-CM | POA: Diagnosis not present

## 2021-06-13 DIAGNOSIS — N2581 Secondary hyperparathyroidism of renal origin: Secondary | ICD-10-CM | POA: Diagnosis not present

## 2021-06-14 ENCOUNTER — Encounter: Payer: Self-pay | Admitting: Endocrinology

## 2021-06-14 ENCOUNTER — Ambulatory Visit (INDEPENDENT_AMBULATORY_CARE_PROVIDER_SITE_OTHER): Payer: Medicare Other | Admitting: Endocrinology

## 2021-06-14 VITALS — BP 144/62 | HR 93 | Ht 63.0 in | Wt 132.0 lb

## 2021-06-14 DIAGNOSIS — I132 Hypertensive heart and chronic kidney disease with heart failure and with stage 5 chronic kidney disease, or end stage renal disease: Secondary | ICD-10-CM | POA: Diagnosis not present

## 2021-06-14 DIAGNOSIS — E1165 Type 2 diabetes mellitus with hyperglycemia: Secondary | ICD-10-CM

## 2021-06-14 DIAGNOSIS — I5032 Chronic diastolic (congestive) heart failure: Secondary | ICD-10-CM | POA: Diagnosis not present

## 2021-06-14 DIAGNOSIS — Z794 Long term (current) use of insulin: Secondary | ICD-10-CM | POA: Diagnosis not present

## 2021-06-14 DIAGNOSIS — E1122 Type 2 diabetes mellitus with diabetic chronic kidney disease: Secondary | ICD-10-CM | POA: Diagnosis not present

## 2021-06-14 DIAGNOSIS — N186 End stage renal disease: Secondary | ICD-10-CM | POA: Diagnosis not present

## 2021-06-14 DIAGNOSIS — M1990 Unspecified osteoarthritis, unspecified site: Secondary | ICD-10-CM | POA: Diagnosis not present

## 2021-06-14 DIAGNOSIS — E785 Hyperlipidemia, unspecified: Secondary | ICD-10-CM | POA: Diagnosis not present

## 2021-06-14 MED ORDER — DEXCOM G6 TRANSMITTER MISC: 1.0000 | Freq: Once | 1 refills | Status: AC

## 2021-06-14 MED ORDER — DEXCOM G6 SENSOR MISC: 3 refills | Status: DC

## 2021-06-14 MED ORDER — DEXCOM G6 RECEIVER DEVI
0 refills | Status: DC
Start: 2021-06-14 — End: 2021-12-27

## 2021-06-14 NOTE — Progress Notes (Signed)
? ? ?Patient ID: Julie Brewer, female   DOB: 1950/05/10, 71 y.o.   MRN: [id number] ? ? ?Reason for Appointment: Follow-up of various problems ? ?History of Present Illness  ? ? ?Diagnosis: Type 2 DIABETES MELITUS, date of diagnosis:  1985 ? ?Prior history: She has been on insulin since diagnosis and on Lantus previously ?Also at some point had been started on Glucophage several years ago also ?Because of insurance preference Lantus was changed to Levemir  ?She refuses to use analog rapid acting insulin because of cost and is using regular insulin for several years   ?Her blood sugars are generally well controlled and A1c usually under 7% ?Her A1c previously was higher with stopping metformin at 8% ? ?Recent history:  ? ?Insulin regimen: Lantus insulin 8 units in the morning daily.  ?Regular insulin 20-30 minutes Before eating, 12 units a.m. and 8 ac supper  ?Oral hypoglycemic drugs: None ? ? ?Current blood sugar patterns, management and problems: ? ?Her A1c is 4.8 compared to 6.1 ? ?Fructosamine is 282 versus 295 ? ?She is using her freestyle libre sensor daily infrequently and has limited data available on 4 days in the last 2 weeks  ?From the limited data appears that she has hypoglycemia during the night twice and this morning she requires feeling shaky and sensor read 40  ?She was reminded on the last visit to check more readings at night but is not doing so ?Also not checking any fingerstick readings to compare ?Her daughter thinks that she is following the instructions given for her insulin on the last visit but the patient says that she is getting 12 units of Lantus instead of the 8 units prescribed  ?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 ?Her daughter is drawing up her insulin with the syringe ? ? ?Side effects from medications: None ?     ?Glucometer:  FreeStyle libre ? ?Blood sugar data as above, recent average glucose 90 but only 20% active CGM time ? ?Previous data: ? ?CGM use % of time 44  ?2-week average/GV 104/39  ?Time in range 80      %  ?% Time Above 180 8  ?% Time above 250   ?% Time Below 70 12 was 26  ? ?  ?PRE-MEAL Fasting Lunch Dinner 12 AM-2 AM Overall  ?Glucose range:       ?Averages: 86   55 104  ? ?POST-MEAL PC Breakfast PC Lunch PC Dinner  ?Glucose range:     ?Averages: 150  122  ? ? ? ?Meals:  usually 2 meals per day at 10 AM and 5 PM. ?   ? Mealtime protein sources:turkey, chicken.  Eating cereal for breakfast Or oatmeal ?Avoiding sweet drinks ? ?Physical activity: exercise: Some walking within the house           ? ? ?Wt Readings from Last 3 Encounters:  ?06/14/21 132 lb (59.9 kg)  ?05/31/21 131 lb 12.8 oz (59.8 kg)  ?05/15/21 129 lb (58.5 kg)  ? ?Lab Results  ?Component Value Date  ? HGBA1C 4.8 05/29/2021  ? HGBA1C 6.1 (H) 04/07/2021  ? HGBA1C 6.3 02/03/2021  ? ?Lab Results  ?Component Value Date  ? MICROALBUR 196.7 (H) 03/11/2018  ?  [place] 58 05/29/2021  ? CREATININE 5.98 (H) 04/25/2021  ?   ? ?Lab Results  ?Component Value Date  ? FRUCTOSAMINE 282 05/29/2021  ? FRUCTOSAMINE 295 (H) 02/03/2021  ? FRUCTOSAMINE 275 05/18/2020  ? FRUCTOSAMINE 368 (H) 02/03/2020  ?  ?Other active problems: See review of systems ? ?  ? ? ?Allergies as of 06/14/2021   ? ?   Reactions  ? Morphine And Related Other (See Comments)  ? Hallucinations   ? Penicillins Swelling, Rash  ? Throat swelling ?Did it involve swelling of the face/tongue/throat, SOB, or low BP? Yes ?Did it involve sudden or severe rash/hives, skin peeling, or any reaction on the inside of your mouth or nose? Yes ?Did you need to seek medical attention at a hospital or doctor's office? Yes ?When did it last happen?   young child    ?If all above answers are ?NO?, may proceed  with cephalosporin use.  ? ?  ? ?  ?Medication List  ?  ? ?  ? Accurate as of Jun 14, 2021 11:21 AM. If you have any questions, ask your nurse or doctor.  ?  ?  ? ?  ? ?acetaminophen 325 MG tablet ?Commonly known as: TYLENOL ?Take 2 tablets (650 mg total) by mouth every 6 (six) hours as needed for mild pain (or Fever >/= 101). ?  ?amLODipine 5 MG tablet ?Commonly known as: NORVASC ?Take 5 mg by mouth daily. ?  ?aspirin 325 MG EC tablet ?Take 1 tablet (325 mg total) by mouth daily. ?  ?atorvastatin 20 MG tablet ?Commonly known as: LIPITOR ?Take 1 tablet by mouth once daily ?  ?bisacodyl 5 MG EC tablet ?Commonly known as: DULCOLAX ?Take 5 mg by mouth daily as needed for moderate constipation. ?  ?cilostazol 100 MG tablet ?Commonly known as: PLETAL ?Take 1 tablet by mouth twice daily ?  ?docusate sodium 100 MG capsule ?Commonly known as: COLACE ?Take 200 mg by mouth daily as needed for mild constipation. ?  ?FreeStyle Freedom Kit ?Use to check blood sugar once a day dx code ?  ?FREESTYLE LITE test strip ?Generic drug: glucose blood ?USE AS INSTRUCTED TO CHECK BLOOD SUGAR ONCE DAILY. ?  ?gabapentin 100 MG capsule ?Commonly known as: NEURONTIN ?TAKE 1 CAPSULE BY MOUTH TWICE DAILY AS NEEDED ?What changed: See the new instructions. ?  ?HECTOROL IV ?Inject 1 Dose into the vein See admin instructions. During dialysis - Tuesday, Thursday and Saturday ?  ?heparin 1000 unit/mL Soln injection ?1,000 Units by Dialysis route See admin instructions. During dialysis ?  ?insulin glargine 100 UNIT/ML injection ?Commonly known as: Lantus ?Inject 0.12 mLs (12 Units total) into the skin daily. ?  ?insulin glargine-yfgn 100 UNIT/ML injection ?Commonly known as: SEMGLEE ?INJECT 12 UNITS SUBCUTANEOUSLY ONCE DAILY IN THE MORNING ?  ?Insulin Pen Needle 31G X 5 MM Misc ?Use with pen ?  ?insulin regular 100 units/mL injection ?Commonly known as: NOVOLIN R ?Inject 7-10 Units into the skin See admin instructions. Taking 8 units with breakfast and  7 units with dinner ?  ?iron sucrose in sodium chloride 0.9 % 100 mL ?Inject 100 mg into the vein See admin instructions. During dialysis ?  ?lidocaine-prilocaine cream ?Commonly known as: EMLA ?1 application. daily as needed (port access). ?  ?MIRCERA IJ ?Inject 1 Dose into the vein See admin instructions. During dialysis ?  ?pantoprazole 40 MG tablet ?Commonly known as: PROTONIX ?Take 1 tablet (40 mg) by mouth twice daily for 4 weeks, then switch  to 1 tablet once daily. ?What changed:  ?how much to take ?how to take this ?when to take this ?additional instructions ?  ?triamcinolone ointment 0.1 % ?Commonly known as: KENALOG ?1 application. daily. ?  ?Vitamin D (Ergocalciferol) 1.25 MG (50000 UNIT) Caps capsule ?Commonly known as: DRISDOL ?Take 1 capsule by mouth once a week ?What changed:  ?how much to take ?how to take this ?when to take this ?additional instructions ?  ? ?  ? ? ?Allergies:  ?Allergies  ?Allergen Reactions  ? Morphine And Related Other (See Comments)  ?  Hallucinations   ? Penicillins Swelling and Rash  ?  Throat swelling ?Did it involve swelling of the face/tongue/throat, SOB, or low BP? Yes ?Did it involve sudden or severe rash/hives, skin peeling, or any reaction on the inside of your mouth or nose? Yes ?Did you need to seek medical attention at a hospital or doctor's office? Yes ?When did it last happen?   young child    ?If all above answers are ?NO?, may proceed with cephalosporin use.  ? ? ?Past Medical History:  ?Diagnosis Date  ? Asthma   ? No probnlems recently  ? Blindness and low vision   ? 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 CHF (congestive heart failure) ([place])   ? Chronic kidney disease   ? Tu/Th/Sa  ? Diabetes mellitus   ? Type II per Dr [name]  (patient said type I)  ? Fibroid   ? GERD (gastroesophageal reflux disease)   ? Glaucoma   ? Hyperlipidemia   ? Hypertension   ? Iron deficiency anemia 03/09/2016  ? Peripheral vascular disease ([place])   ? Pneumonia  2006  ? PONV (postoperative nausea and vomiting)   ? Seizure disorder ([place])   ? Shortness of breath dyspnea   ? with exdrtion, "Walkling too fast"  ? Stroke [facility])   ? no residual  ? ? ?Past Surgical History

## 2021-06-14 NOTE — Patient Instructions (Addendum)
Stop lantus ? ?Check blood sugars on waking up 3 days a week ? ?Also check blood sugars about 2 hours after meals and do this after different meals by rotation ? ?Recommended blood sugar levels on waking up are 90-130 and about 2 hours after meal is 130-160 ? ?Please bring your blood sugar monitor to each visit, thank you ? ?Check fingerstick at same time ?

## 2021-06-15 ENCOUNTER — Other Ambulatory Visit: Payer: Self-pay | Admitting: Endocrinology

## 2021-06-15 DIAGNOSIS — Z992 Dependence on renal dialysis: Secondary | ICD-10-CM | POA: Diagnosis not present

## 2021-06-15 DIAGNOSIS — E1122 Type 2 diabetes mellitus with diabetic chronic kidney disease: Secondary | ICD-10-CM | POA: Diagnosis not present

## 2021-06-15 DIAGNOSIS — I5032 Chronic diastolic (congestive) heart failure: Secondary | ICD-10-CM | POA: Diagnosis not present

## 2021-06-15 DIAGNOSIS — M1990 Unspecified osteoarthritis, unspecified site: Secondary | ICD-10-CM | POA: Diagnosis not present

## 2021-06-15 DIAGNOSIS — D509 Iron deficiency anemia, unspecified: Secondary | ICD-10-CM | POA: Diagnosis not present

## 2021-06-15 DIAGNOSIS — E785 Hyperlipidemia, unspecified: Secondary | ICD-10-CM | POA: Diagnosis not present

## 2021-06-15 DIAGNOSIS — I132 Hypertensive heart and chronic kidney disease with heart failure and with stage 5 chronic kidney disease, or end stage renal disease: Secondary | ICD-10-CM | POA: Diagnosis not present

## 2021-06-15 DIAGNOSIS — E1129 Type 2 diabetes mellitus with other diabetic kidney complication: Secondary | ICD-10-CM | POA: Diagnosis not present

## 2021-06-15 DIAGNOSIS — N186 End stage renal disease: Secondary | ICD-10-CM | POA: Diagnosis not present

## 2021-06-15 DIAGNOSIS — N2581 Secondary hyperparathyroidism of renal origin: Secondary | ICD-10-CM | POA: Diagnosis not present

## 2021-06-17 DIAGNOSIS — Z992 Dependence on renal dialysis: Secondary | ICD-10-CM | POA: Diagnosis not present

## 2021-06-17 DIAGNOSIS — N186 End stage renal disease: Secondary | ICD-10-CM | POA: Diagnosis not present

## 2021-06-17 DIAGNOSIS — E1129 Type 2 diabetes mellitus with other diabetic kidney complication: Secondary | ICD-10-CM | POA: Diagnosis not present

## 2021-06-17 DIAGNOSIS — D509 Iron deficiency anemia, unspecified: Secondary | ICD-10-CM | POA: Diagnosis not present

## 2021-06-17 DIAGNOSIS — N2581 Secondary hyperparathyroidism of renal origin: Secondary | ICD-10-CM | POA: Diagnosis not present

## 2021-06-19 DIAGNOSIS — I10 Essential (primary) hypertension: Secondary | ICD-10-CM | POA: Diagnosis not present

## 2021-06-19 DIAGNOSIS — E785 Hyperlipidemia, unspecified: Secondary | ICD-10-CM | POA: Diagnosis not present

## 2021-06-19 DIAGNOSIS — N186 End stage renal disease: Secondary | ICD-10-CM | POA: Diagnosis not present

## 2021-06-19 DIAGNOSIS — E1122 Type 2 diabetes mellitus with diabetic chronic kidney disease: Secondary | ICD-10-CM | POA: Diagnosis not present

## 2021-06-20 ENCOUNTER — Ambulatory Visit: Payer: Medicare Other | Admitting: Cardiovascular Disease

## 2021-06-20 DIAGNOSIS — D509 Iron deficiency anemia, unspecified: Secondary | ICD-10-CM | POA: Diagnosis not present

## 2021-06-20 DIAGNOSIS — Z992 Dependence on renal dialysis: Secondary | ICD-10-CM | POA: Diagnosis not present

## 2021-06-20 DIAGNOSIS — N186 End stage renal disease: Secondary | ICD-10-CM | POA: Diagnosis not present

## 2021-06-20 DIAGNOSIS — N2581 Secondary hyperparathyroidism of renal origin: Secondary | ICD-10-CM | POA: Diagnosis not present

## 2021-06-20 DIAGNOSIS — E1129 Type 2 diabetes mellitus with other diabetic kidney complication: Secondary | ICD-10-CM | POA: Diagnosis not present

## 2021-06-21 DIAGNOSIS — E1122 Type 2 diabetes mellitus with diabetic chronic kidney disease: Secondary | ICD-10-CM | POA: Diagnosis not present

## 2021-06-21 DIAGNOSIS — I5032 Chronic diastolic (congestive) heart failure: Secondary | ICD-10-CM | POA: Diagnosis not present

## 2021-06-21 DIAGNOSIS — M1990 Unspecified osteoarthritis, unspecified site: Secondary | ICD-10-CM | POA: Diagnosis not present

## 2021-06-21 DIAGNOSIS — N186 End stage renal disease: Secondary | ICD-10-CM | POA: Diagnosis not present

## 2021-06-21 DIAGNOSIS — E785 Hyperlipidemia, unspecified: Secondary | ICD-10-CM | POA: Diagnosis not present

## 2021-06-21 DIAGNOSIS — I132 Hypertensive heart and chronic kidney disease with heart failure and with stage 5 chronic kidney disease, or end stage renal disease: Secondary | ICD-10-CM | POA: Diagnosis not present

## 2021-06-22 DIAGNOSIS — D509 Iron deficiency anemia, unspecified: Secondary | ICD-10-CM | POA: Diagnosis not present

## 2021-06-22 DIAGNOSIS — Z992 Dependence on renal dialysis: Secondary | ICD-10-CM | POA: Diagnosis not present

## 2021-06-22 DIAGNOSIS — N2581 Secondary hyperparathyroidism of renal origin: Secondary | ICD-10-CM | POA: Diagnosis not present

## 2021-06-22 DIAGNOSIS — N186 End stage renal disease: Secondary | ICD-10-CM | POA: Diagnosis not present

## 2021-06-22 DIAGNOSIS — E1129 Type 2 diabetes mellitus with other diabetic kidney complication: Secondary | ICD-10-CM | POA: Diagnosis not present

## 2021-06-23 ENCOUNTER — Ambulatory Visit: Payer: Medicare Other | Admitting: Cardiovascular Disease

## 2021-06-24 DIAGNOSIS — N2581 Secondary hyperparathyroidism of renal origin: Secondary | ICD-10-CM | POA: Diagnosis not present

## 2021-06-24 DIAGNOSIS — N186 End stage renal disease: Secondary | ICD-10-CM | POA: Diagnosis not present

## 2021-06-24 DIAGNOSIS — Z992 Dependence on renal dialysis: Secondary | ICD-10-CM | POA: Diagnosis not present

## 2021-06-24 DIAGNOSIS — D509 Iron deficiency anemia, unspecified: Secondary | ICD-10-CM | POA: Diagnosis not present

## 2021-06-24 DIAGNOSIS — E1129 Type 2 diabetes mellitus with other diabetic kidney complication: Secondary | ICD-10-CM | POA: Diagnosis not present

## 2021-06-27 DIAGNOSIS — N186 End stage renal disease: Secondary | ICD-10-CM | POA: Diagnosis not present

## 2021-06-27 DIAGNOSIS — D509 Iron deficiency anemia, unspecified: Secondary | ICD-10-CM | POA: Diagnosis not present

## 2021-06-27 DIAGNOSIS — Z992 Dependence on renal dialysis: Secondary | ICD-10-CM | POA: Diagnosis not present

## 2021-06-27 DIAGNOSIS — E1129 Type 2 diabetes mellitus with other diabetic kidney complication: Secondary | ICD-10-CM | POA: Diagnosis not present

## 2021-06-27 DIAGNOSIS — N2581 Secondary hyperparathyroidism of renal origin: Secondary | ICD-10-CM | POA: Diagnosis not present

## 2021-06-28 DIAGNOSIS — E785 Hyperlipidemia, unspecified: Secondary | ICD-10-CM | POA: Diagnosis not present

## 2021-06-28 DIAGNOSIS — E1122 Type 2 diabetes mellitus with diabetic chronic kidney disease: Secondary | ICD-10-CM | POA: Diagnosis not present

## 2021-06-28 DIAGNOSIS — I132 Hypertensive heart and chronic kidney disease with heart failure and with stage 5 chronic kidney disease, or end stage renal disease: Secondary | ICD-10-CM | POA: Diagnosis not present

## 2021-06-28 DIAGNOSIS — I5032 Chronic diastolic (congestive) heart failure: Secondary | ICD-10-CM | POA: Diagnosis not present

## 2021-06-28 DIAGNOSIS — N186 End stage renal disease: Secondary | ICD-10-CM | POA: Diagnosis not present

## 2021-06-28 DIAGNOSIS — M1990 Unspecified osteoarthritis, unspecified site: Secondary | ICD-10-CM | POA: Diagnosis not present

## 2021-06-29 ENCOUNTER — Other Ambulatory Visit: Payer: Self-pay | Admitting: Endocrinology

## 2021-06-29 DIAGNOSIS — E1122 Type 2 diabetes mellitus with diabetic chronic kidney disease: Secondary | ICD-10-CM | POA: Diagnosis not present

## 2021-06-29 DIAGNOSIS — N2581 Secondary hyperparathyroidism of renal origin: Secondary | ICD-10-CM | POA: Diagnosis not present

## 2021-06-29 DIAGNOSIS — E1165 Type 2 diabetes mellitus with hyperglycemia: Secondary | ICD-10-CM | POA: Diagnosis not present

## 2021-06-29 DIAGNOSIS — Z992 Dependence on renal dialysis: Secondary | ICD-10-CM | POA: Diagnosis not present

## 2021-06-29 DIAGNOSIS — N186 End stage renal disease: Secondary | ICD-10-CM | POA: Diagnosis not present

## 2021-07-01 DIAGNOSIS — N2581 Secondary hyperparathyroidism of renal origin: Secondary | ICD-10-CM | POA: Diagnosis not present

## 2021-07-01 DIAGNOSIS — Z992 Dependence on renal dialysis: Secondary | ICD-10-CM | POA: Diagnosis not present

## 2021-07-01 DIAGNOSIS — N186 End stage renal disease: Secondary | ICD-10-CM | POA: Diagnosis not present

## 2021-07-04 DIAGNOSIS — N186 End stage renal disease: Secondary | ICD-10-CM | POA: Diagnosis not present

## 2021-07-04 DIAGNOSIS — Z992 Dependence on renal dialysis: Secondary | ICD-10-CM | POA: Diagnosis not present

## 2021-07-04 DIAGNOSIS — N2581 Secondary hyperparathyroidism of renal origin: Secondary | ICD-10-CM | POA: Diagnosis not present

## 2021-07-05 ENCOUNTER — Other Ambulatory Visit: Payer: Self-pay | Admitting: Endocrinology

## 2021-07-06 DIAGNOSIS — N186 End stage renal disease: Secondary | ICD-10-CM | POA: Diagnosis not present

## 2021-07-06 DIAGNOSIS — N2581 Secondary hyperparathyroidism of renal origin: Secondary | ICD-10-CM | POA: Diagnosis not present

## 2021-07-06 DIAGNOSIS — Z992 Dependence on renal dialysis: Secondary | ICD-10-CM | POA: Diagnosis not present

## 2021-07-08 DIAGNOSIS — N2581 Secondary hyperparathyroidism of renal origin: Secondary | ICD-10-CM | POA: Diagnosis not present

## 2021-07-08 DIAGNOSIS — N186 End stage renal disease: Secondary | ICD-10-CM | POA: Diagnosis not present

## 2021-07-08 DIAGNOSIS — Z992 Dependence on renal dialysis: Secondary | ICD-10-CM | POA: Diagnosis not present

## 2021-07-10 ENCOUNTER — Emergency Department (HOSPITAL_COMMUNITY): Payer: Medicare PPO

## 2021-07-10 ENCOUNTER — Other Ambulatory Visit: Payer: Self-pay

## 2021-07-10 ENCOUNTER — Other Ambulatory Visit: Payer: Self-pay | Admitting: *Deleted

## 2021-07-10 ENCOUNTER — Emergency Department (HOSPITAL_COMMUNITY)
Admission: EM | Admit: 2021-07-10 | Discharge: 2021-07-10 | Disposition: A | Discharge status: Home / Self Care | Payer: Medicare PPO | Attending: Emergency Medicine | Admitting: Emergency Medicine

## 2021-07-10 DIAGNOSIS — R55 Syncope and collapse: Secondary | ICD-10-CM | POA: Insufficient documentation

## 2021-07-10 DIAGNOSIS — W19XXXA Unspecified fall, initial encounter: Secondary | ICD-10-CM

## 2021-07-10 DIAGNOSIS — W1839XA Other fall on same level, initial encounter: Secondary | ICD-10-CM | POA: Diagnosis not present

## 2021-07-10 DIAGNOSIS — I959 Hypotension, unspecified: Secondary | ICD-10-CM | POA: Diagnosis not present

## 2021-07-10 DIAGNOSIS — R61 Generalized hyperhidrosis: Secondary | ICD-10-CM | POA: Diagnosis not present

## 2021-07-10 DIAGNOSIS — R0902 Hypoxemia: Secondary | ICD-10-CM | POA: Diagnosis not present

## 2021-07-10 LAB — CBC WITH DIFFERENTIAL/PLATELET
Abs Immature Granulocytes: 0.04 10*3/uL (ref 0.00–0.07)
Basophils Absolute: 0.1 10*3/uL (ref 0.0–0.1)
Basophils Relative: 1 %
Eosinophils Absolute: 0.1 10*3/uL (ref 0.0–0.5)
Eosinophils Relative: 1 %
HCT: 29.2 % — ABNORMAL LOW (ref 36.0–46.0)
Hemoglobin: 9 g/dL — ABNORMAL LOW (ref 12.0–15.0)
Immature Granulocytes: 1 %
Lymphocytes Relative: 13 %
Lymphs Abs: 0.9 10*3/uL (ref 0.7–4.0)
MCH: 26 pg (ref 26.0–34.0)
MCHC: 30.8 g/dL (ref 30.0–36.0)
MCV: 84.4 fL (ref 80.0–100.0)
Monocytes Absolute: 0.6 10*3/uL (ref 0.1–1.0)
Monocytes Relative: 8 %
Neutro Abs: 5.4 10*3/uL (ref 1.7–7.7)
Neutrophils Relative %: 76 %
Platelets: 263 10*3/uL (ref 150–400)
RBC: 3.46 MIL/uL — ABNORMAL LOW (ref 3.87–5.11)
RDW: 17.3 % — ABNORMAL HIGH (ref 11.5–15.5)
WBC: 7.1 10*3/uL (ref 4.0–10.5)
nRBC: 0 % (ref 0.0–0.2)

## 2021-07-10 LAB — COMPREHENSIVE METABOLIC PANEL
ALT: 10 U/L (ref 0–44)
AST: 12 U/L — ABNORMAL LOW (ref 15–41)
Albumin: 3.2 g/dL — ABNORMAL LOW (ref 3.5–5.0)
Alkaline Phosphatase: 62 U/L (ref 38–126)
Anion gap: 12 (ref 5–15)
BUN: 20 mg/dL (ref 8–23)
CO2: 25 mmol/L (ref 22–32)
Calcium: 8.8 mg/dL — ABNORMAL LOW (ref 8.9–10.3)
Chloride: 100 mmol/L (ref 98–111)
Creatinine, Ser: 5.61 mg/dL — ABNORMAL HIGH (ref 0.44–1.00)
GFR, Estimated: 8 mL/min — ABNORMAL LOW (ref >60)
Glucose, Bld: 110 mg/dL — ABNORMAL HIGH (ref 70–99)
Potassium: 3.1 mmol/L — ABNORMAL LOW (ref 3.5–5.1)
Sodium: 137 mmol/L (ref 135–145)
Total Bilirubin: 0.5 mg/dL (ref 0.3–1.2)
Total Protein: 5.8 g/dL — ABNORMAL LOW (ref 6.5–8.1)

## 2021-07-10 LAB — URINALYSIS, ROUTINE W REFLEX MICROSCOPIC
Bilirubin Urine: NEGATIVE
Glucose, UA: NEGATIVE mg/dL
Hgb urine dipstick: NEGATIVE
Ketones, ur: NEGATIVE mg/dL
Leukocytes,Ua: NEGATIVE
Nitrite: NEGATIVE
Protein, ur: 100 mg/dL — AB
Specific Gravity, Urine: 1.006 (ref 1.005–1.030)
pH: 8 (ref 5.0–8.0)

## 2021-07-10 LAB — TROPONIN I (HIGH SENSITIVITY): Troponin I (High Sensitivity): 19 ng/L — ABNORMAL HIGH (ref <18)

## 2021-07-10 MED ORDER — POTASSIUM CHLORIDE CRYS ER 10 MEQ PO TBCR
10.0000 meq | EXTENDED_RELEASE_TABLET | Freq: Once | ORAL | Status: AC
  Administered 2021-07-10: 10 meq via ORAL
  Filled 2021-07-10: qty 1

## 2021-07-10 NOTE — Discharge Instructions (Signed)
Return for any problem.  ?

## 2021-07-10 NOTE — ED Triage Notes (Signed)
[facility], coming from home, family states she had a witnessed syncopal episode. Pt denies LOC, states that her leg got weak and she eased herself down. Denies pain.  Dialysis pt.   20g RFA, 250 ml NS given by ems.   118/50, 80HR, 124cbg,

## 2021-07-10 NOTE — ED Provider Notes (Signed)
[facility] EMERGENCY DEPARTMENT Provider Note   CSN: [id number] Arrival date & time: 07/10/21  1721     History  Chief Complaint  Patient presents with   Near Syncope    Julie Brewer is a 71 y.o. female.  71 year old female with prior medical history as detailed below presents for evaluation.  Patient reports that she was sitting on her porch and then went inside.  As she was going inside her "legs gave out."  She then eased herself down.  She did not injure herself during this episode.  She denies actual loss of consciousness.  She denies associated chest pain, shortness of breath, head injury, other complaint.  Patient with history of ESRD on HD.  Her next dialysis is due tomorrow morning at 11.  The history is provided by the patient and medical records.  Near Syncope This is a new problem. The current episode started 1 to 2 hours ago. The problem occurs rarely. The problem has not changed since onset.      Home Medications Prior to Admission medications   Medication Sig Start Date End Date Taking? Authorizing Provider  acetaminophen (TYLENOL) 325 MG tablet Take 2 tablets (650 mg total) by mouth every 6 (six) hours as needed for mild pain (or Fever >/= 101). 02/20/19   Domenic Polite, MD  amLODipine (NORVASC) 5 MG tablet Take 5 mg by mouth daily. 04/06/21   [provider]  aspirin 325 MG EC tablet Take 1 tablet (325 mg total) by mouth daily. 04/15/21   Ghimire, Henreitta Leber, MD  atorvastatin (LIPITOR) 20 MG tablet Take 1 tablet by mouth once daily 06/15/21   Elayne Snare, MD  bisacodyl (DULCOLAX) 5 MG EC tablet Take 5 mg by mouth daily as needed for moderate constipation.    [provider]  Blood Glucose Monitoring Suppl (FREESTYLE FREEDOM) KIT Use to check blood sugar once a day dx code 02/05/20   Elayne Snare, MD  cilostazol (PLETAL) 100 MG tablet Take 1 tablet by mouth twice daily 05/11/21   Angelia Mould, MD  Continuous  Blood Gluc Receiver (DEXCOM G6 RECEIVER) DEVI Use to display glucose from sensor 06/14/21   Elayne Snare, MD  Continuous Blood Gluc Sensor (DEXCOM G6 SENSOR) MISC Use to monitor blood sugar, change after 10 days 06/14/21   Elayne Snare, MD  docusate sodium (COLACE) 100 MG capsule Take 200 mg by mouth daily as needed for mild constipation.    [provider]  Doxercalciferol (HECTOROL IV) Inject 1 Dose into the vein See admin instructions. During dialysis - Tuesday, Thursday and Saturday 11/22/20 11/21/21  [provider]  gabapentin (NEURONTIN) 100 MG capsule TAKE 1 CAPSULE BY MOUTH TWICE DAILY AS NEEDED Patient taking differently: 100 mg 2 (two) times daily as needed (neuropathy). 04/18/21   Elayne Snare, MD  glucose blood (FREESTYLE LITE) test strip USE AS INSTRUCTED TO CHECK BLOOD SUGAR ONCE DAILY. 05/12/19   Elayne Snare, MD  heparin 1000 unit/mL SOLN injection 1,000 Units by Dialysis route See admin instructions. During dialysis 11/15/20 11/14/21  [provider]  insulin glargine (LANTUS) 100 UNIT/ML injection Inject 0.12 mLs (12 Units total) into the skin daily. 04/13/21   Ghimire, Henreitta Leber, MD  Insulin Pen Needle 31G X 5 MM MISC Use with pen 02/17/20   Elayne Snare, MD  insulin regular (NOVOLIN R) 100 units/mL injection Inject 7-10 Units into the skin See admin instructions. Taking 8 units with breakfast and 7 units with dinner  [provider]  iron sucrose in sodium chloride 0.9 % 100 mL Inject 100 mg into the vein See admin instructions. During dialysis 11/26/20 11/25/21  [provider]  lidocaine-prilocaine (EMLA) cream 1 application. daily as needed (port access). 08/10/19   [provider]  pantoprazole (PROTONIX) 40 MG tablet Take 1 tablet (40 mg) by mouth twice daily for 4 weeks, then switch to 1 tablet once daily. Patient taking differently: Take 40 mg by mouth daily. 04/13/21   Ghimire, Henreitta Leber, MD  triamcinolone ointment (KENALOG) 0.1 %  1 application. daily. 09/28/20   [provider]  Vitamin D, Ergocalciferol, (DRISDOL) 1.25 MG (50000 UNIT) CAPS capsule Take 1 capsule by mouth once a week Patient taking differently: Provided at dialysis 03/16/19   Elayne Snare, MD      Allergies    Morphine and related and Penicillins    Review of Systems   Review of Systems  Cardiovascular:  Positive for near-syncope.  All other systems reviewed and are negative.   Physical Exam Updated Vital Signs BP (!) 175/69   Pulse 76   Temp 98.2 F (36.8 C) (Oral)   Resp 12   SpO2 96%  Physical Exam Vitals and nursing note reviewed.  Constitutional:      General: She is not in acute distress.    Appearance: Normal appearance. She is well-developed.  HENT:     Head: Normocephalic and atraumatic.  Eyes:     Conjunctiva/sclera: Conjunctivae normal.     Pupils: Pupils are equal, round, and reactive to light.  Cardiovascular:     Rate and Rhythm: Normal rate and regular rhythm.     Heart sounds: Normal heart sounds.  Pulmonary:     Effort: Pulmonary effort is normal. No respiratory distress.     Breath sounds: Normal breath sounds.  Abdominal:     General: There is no distension.     Palpations: Abdomen is soft.     Tenderness: There is no abdominal tenderness.  Musculoskeletal:        General: No deformity. Normal range of motion.     Cervical back: Normal range of motion and neck supple.  Skin:    General: Skin is warm and dry.  Neurological:     General: No focal deficit present.     Mental Status: She is alert and oriented to person, place, and time.     ED Results / Procedures / Treatments   Labs (all labs ordered are listed, but only abnormal results are displayed) Labs Reviewed  COMPREHENSIVE METABOLIC PANEL - Abnormal; Notable for the following components:      Result Value   Potassium 3.1 (*)    Glucose, Bld 110 (*)    Creatinine, Ser 5.61 (*)    Calcium 8.8 (*)    Total Protein 5.8 (*)    Albumin  3.2 (*)    AST 12 (*)    GFR, Estimated 8 (*)    All other components within normal limits  CBC WITH DIFFERENTIAL/PLATELET - Abnormal; Notable for the following components:   RBC 3.46 (*)    Hemoglobin 9.0 (*)    HCT 29.2 (*)    RDW 17.3 (*)    All other components within normal limits  TROPONIN I (HIGH SENSITIVITY) - Abnormal; Notable for the following components:   Troponin I (High Sensitivity) 19 (*)    All other components within normal limits  URINALYSIS, ROUTINE W REFLEX MICROSCOPIC  TROPONIN I (HIGH SENSITIVITY)  EKG EKG Interpretation  Date/Time:  Monday July 10 2021 20:44:38 EDT Ventricular Rate:  87 PR Interval:  153 QRS Duration: 101 QT Interval:  411 QTC Calculation: 495 R Axis:   -59 Text Interpretation: Sinus rhythm Left anterior fascicular block LVH with secondary repolarization abnormality Anterior Q waves, possibly due to LVH Confirmed by Dene Gentry [phone]) on 07/10/2021 8:46:59 PM  Radiology CT Head Wo Contrast  Result Date: 07/10/2021 CLINICAL DATA:  Head trauma, syncope EXAM: CT HEAD WITHOUT CONTRAST TECHNIQUE: Contiguous axial images were obtained from the base of the skull through the vertex without intravenous contrast. RADIATION DOSE REDUCTION: This exam was performed according to the departmental dose-optimization program which includes automated exposure control, adjustment of the mA and/or kV according to patient size and/or use of iterative reconstruction technique. COMPARISON:  04/24/2021 FINDINGS: Brain: There is atrophy and chronic small vessel disease changes. No acute intracranial abnormality. Specifically, no hemorrhage, hydrocephalus, mass lesion, acute infarction, or significant intracranial injury. Vascular: No hyperdense vessel or unexpected calcification. Skull: No acute calvarial abnormality. Sinuses/Orbits: No acute findings Other: None IMPRESSION: Atrophy, chronic microvascular disease. No acute intracranial abnormality. Electronically  Signed   By: Rolm Baptise M.D.   On: 07/10/2021 19:28   DG Chest Port 1 View  Result Date: 07/10/2021 CLINICAL DATA:  Weakness in 71 year old female. EXAM: PORTABLE CHEST 1 VIEW COMPARISON:  April 24, 2021. FINDINGS: EKG leads project over the chest. Cardiac loop recorder projects over the inferior LEFT cardiac silhouette. Cardiomediastinal contours and hilar structures are normal. Minimal linear atelectasis in the LEFT chest. No signs of lobar consolidation. No visible effusion or pneumothorax. On limited assessment there is no acute skeletal finding. IMPRESSION: 1. Minimal linear atelectasis in the LEFT chest. No acute abnormality. Electronically Signed   By: Zetta Bills M.D.   On: 07/10/2021 18:19    Procedures Procedures    Medications Ordered in ED Medications  potassium chloride (KLOR-CON M) CR tablet 10 mEq (10 mEq Oral Given 07/10/21 2059)    ED Course/ Medical Decision Making/ A&P                           Medical Decision Making Amount and/or Complexity of Data Reviewed Labs: ordered. Radiology: ordered.  Risk Prescription drug management.    Medical Screen Complete  This patient presented to the ED with complaint of fall, possible near syncope.  This complaint involves an extensive number of treatment options. The initial differential diagnosis includes, but is not limited to, metabolic abnormality, etc.  This presentation is: Acute, Self-Limited, Previously Undiagnosed, Uncertain Prognosis, Complicated, Systemic Symptoms, and Threat to Life/Bodily Function  Patient presents after reported fall.  Patient reports that she feels weak and lowered herself to the ground.  She denies actual full syncope.  She is without other acute complaint on evaluation.  She says she feels significantly improved at time of ED evaluation.  Screening labs obtained are without significant abnormality.  Patient is now desires DC home.   Patient declines to wait for second troponin  which apparently has been misplaced by lab.  Patient does understand need for close outpatient follow-up  She is scheduled for dialysis tomorrow at 11 AM.  She plans on attending her dialysis session.   Co morbidities that complicated the patient's evaluation  ESRD on HD   Additional history obtained:  External records from outside sources obtained and reviewed including prior ED visits and prior Inpatient records.    Lab Tests:  I ordered and personally interpreted labs.  The pertinent results include: CBC, CMP, troponin, UA   Imaging Studies ordered:  I ordered imaging studies including CT head, chest x-ray I independently visualized and interpreted obtained imaging which showed NAD I agree with the radiologist interpretation.   Cardiac Monitoring:  The patient was maintained on a cardiac monitor.  I personally viewed and interpreted the cardiac monitor which showed an underlying rhythm of: NSR   Medicines ordered:  I ordered medication including potassium orally for mild hypokalemia Reevaluation of the patient after these medicines showed that the patient: improved   Problem List / ED Course:  Fall, possible near syncope   Reevaluation:  After the interventions noted above, I reevaluated the patient and found that they have: improved   Disposition:  After consideration of the diagnostic results and the patients response to treatment, I feel that the patent would benefit from close outpatient follow-up.          Final Clinical Impression(s) / ED Diagnoses Final diagnoses:  Near syncope  Fall, initial encounter    Rx / DC Orders ED Discharge Orders     None         Valarie Merino, MD 07/10/21 2240

## 2021-07-10 NOTE — Patient Outreach (Signed)
[facility]) Care Management Telephonic RN Care Manager Note   07/10/2021 Name:  Julie Brewer MRN:  [id number] DOB:  [birth year]  Summary: Pt reports ongoing adherence with her monitoring and scheduled dialysis. No reported issues with her HF as pt remains at her baseline weight with no related symptoms.  Recommendations/Changes made from today's visit: Will continue review and discussed plan of care and encouraged ongoing adherences. No immediate pending appointments mentioned however pt continues to have a good support system with her daughter [name]. No acute changes as pt remains adherent with all discussed.  Subjective: Julie Brewer is an 71 y.o. year old female who is a primary patient of Lindell Noe, Anastasia Pall, MD. The care management team was consulted for assistance with care management and/or care coordination needs.    Telephonic RN Care Manager completed Telephone Visit today.  Objective:   Medications Reviewed Today     Reviewed by Elayne Snare, MD (Physician) on 06/14/21 at 1354  Med List Status: <None>   Medication Order Taking? Sig Documenting Provider Last Dose Status Informant  acetaminophen (TYLENOL) 325 MG tablet [id number] Yes Take 2 tablets (650 mg total) by mouth every 6 (six) hours as needed for mild pain (or Fever >/= 101). Domenic Polite, MD Taking Active Child  amLODipine (NORVASC) 5 MG tablet [id number] Yes Take 5 mg by mouth daily. [provider] Taking Active Child  aspirin 325 MG EC tablet [id number] Yes Take 1 tablet (325 mg total) by mouth daily. Jonetta Osgood, MD Taking Active Child  atorvastatin (LIPITOR) 20 MG tablet [id number] Yes Take 1 tablet by mouth once daily  Patient taking differently: Take 20 mg by mouth daily.   Elayne Snare, MD Taking Active Child  bisacodyl (DULCOLAX) 5 MG EC tablet [id number] Yes Take 5 mg by mouth daily as needed for moderate constipation. [provider] Taking Active  Child  Blood Glucose Monitoring Suppl (FREESTYLE FREEDOM) KIT [id number] Yes Use to check blood sugar once a day dx code Elayne Snare, MD Taking Active Child  cilostazol (PLETAL) 100 MG tablet [id number] Yes Take 1 tablet by mouth twice daily Angelia Mould, MD Taking Active   Continuous Blood Gluc Receiver ([place]) DEVI [id number] Yes Use to display glucose from sensor Elayne Snare, MD  Active   Continuous Blood Gluc Sensor (DEXCOM G6 SENSOR) MISC [id number] Yes Use to monitor blood sugar, change after 10 days Elayne Snare, MD  Active   Continuous Blood Gluc Transmit (DEXCOM G6 TRANSMITTER) MISC [id number] Yes 1 Device by Does not apply route once for 1 dose. Elayne Snare, MD  Active   docusate sodium (COLACE) 100 MG capsule [id number] Yes Take 200 mg by mouth daily as needed for mild constipation. [provider] Taking Active Child  Doxercalciferol (HECTOROL IV) [id number] Yes Inject 1 Dose into the vein See admin instructions. During dialysis - Tuesday, Thursday and Saturday [provider] Taking Active Child  gabapentin (NEURONTIN) 100 MG capsule [id number] Yes TAKE 1 CAPSULE BY MOUTH TWICE DAILY AS NEEDED  Patient taking differently: 100 mg 2 (two) times daily as needed (neuropathy).   Elayne Snare, MD Taking Active Child  glucose blood (FREESTYLE LITE) test strip [id number] Yes USE AS INSTRUCTED TO CHECK BLOOD SUGAR ONCE DAILY. Elayne Snare, MD Taking Active Child  heparin 1000 unit/mL SOLN injection [id number] Yes 1,000 Units by Dialysis route See admin instructions. During dialysis [provider] Taking Active Child  insulin glargine (LANTUS) 100 UNIT/ML injection [id number] Yes Inject  0.12 mLs (12 Units total) into the skin daily. Jonetta Osgood, MD Taking Active Child  Insulin Pen Needle 31G X 5 MM MISC [id number] Yes Use with pen Elayne Snare, MD Taking Active Child  insulin regular (NOVOLIN R) 100 units/mL injection [id number] Yes Inject 7-10 Units into  the skin See admin instructions. Taking 8 units with breakfast and 7 units with dinner [provider] Taking Active Child  iron sucrose in sodium chloride 0.9 % 100 mL [id number] Yes Inject 100 mg into the vein See admin instructions. During dialysis [provider] Taking Active Child  lidocaine-prilocaine (EMLA) cream [id number] Yes 1 application. daily as needed (port access). [provider] Taking Active Child  Methoxy PEG-Epoetin Beta (MIRCERA IJ) [id number] Yes Inject 1 Dose into the vein See admin instructions. During dialysis [provider] Taking Active Child  pantoprazole (PROTONIX) 40 MG tablet [id number] Yes Take 1 tablet (40 mg) by mouth twice daily for 4 weeks, then switch to 1 tablet once daily.  Patient taking differently: Take 40 mg by mouth daily.   Jonetta Osgood, MD Taking Active Child  triamcinolone ointment (KENALOG) 0.1 % [id number] Yes 1 application. daily. [provider] Taking Active Child  Vitamin D, Ergocalciferol, (DRISDOL) 1.25 MG (50000 UNIT) CAPS capsule [id number] Yes Take 1 capsule by mouth once a week  Patient taking differently: Provided at dialysis   Elayne Snare, MD Taking Active Child           Med Note Broadus John, [facility]   Fri Apr 07, 2021  4:10 PM)               SDOH:  (Social Determinants of Health) assessments and interventions performed:     Care Plan  Review of patient past medical history, allergies, medications, health status, including review of consultants reports, laboratory and other test data, was performed as part of comprehensive evaluation for care management services.   Care Plan : RN Care manager plan of care  Updates made by Tobi Bastos, RN since 07/10/2021 12:00 AM     Problem: Knowledge Deficit related to CHF and care coordination needs   Priority: High     Long-Range Goal: Development plan of care fore management of CHF   Start Date: 05/10/2021  Expected End Date:  11/28/2021  This Visit's Progress: On track  Recent Progress: On track  Priority: High  Note:   Current Barriers:  Knowledge Deficits related to plan of care for management of CHF   RNCM Clinical Goal(s):  Patient will verbalize basic understanding of  CHF disease process and self health management plan as evidenced by Self reporting and chart review take all medications exactly as prescribed and will call provider for medication related questions as evidenced by self reporting and chart review  through collaboration with RN Care manager, provider, and care team.   Interventions: Inter-disciplinary care team collaboration (see longitudinal plan of care) Evaluation of current treatment plan related to  self management and patient's adherence to plan as established by provider   Heart Failure Interventions:  (Status:  New goal.) Long Term Goal Basic overview and discussion of pathophysiology of Heart Failure reviewed Provided education on low sodium diet Reviewed Heart Failure Action Plan in depth and provided written copy Assessed need for readable accurate scales in home Provided education about placing scale on hard, flat surface Discussed importance of daily weight and advised patient to weigh and record daily Discussed the importance of keeping all appointments with provider Screening  for signs and symptoms of depression related to chronic disease state   06/09/2021 Update: Daughter [name]  has provided reported today indicating pt remains with [facility] for OT services all other services completed. Pt continue to weight at baseline 131 lbs (no swelling and remains in the GREEN zone) and has dialysis on T/TH/Sat with no reported delays or issues. Pt doing with and adherent to the discussed plan of care related to sufficient transportation to all medical appointments and medication administration. No HF medications due to dialysis with removal of added fluid at this time. Great support  system and lives with her daughter and grandchildren. No issues reported with use of her ongoing Libra (blind).  07/10/2021 Update: Spoke with pt today who indicates she is doing well with recent weight of 132 lbs with ongoing dialysis T/TH/S stating most of her added fluid of retention is taking off at that time. Pt denies any related symptoms and remains in the GREEN zone today. No medications changes since the last encounter and pt continue to manage her diabetes with no acute needs or related issues to address this month. Pt reports she has finished with [facility] services for PT/OT/RN.  Pt continue to be adherent with use of her Libra for her CBGs with good announces readings due to her vision (partially blind).   Patient Goals/Self-Care Activities: Take all medications as prescribed Attend all scheduled provider appointments Call pharmacy for medication refills 3-7 days in advance of running out of medications Attend church or other social activities Perform all self care activities independently  Perform IADL's (shopping, preparing meals, housekeeping, managing finances) independently Call provider office for new concerns or questions  call office if I gain more than 2 pounds in one day or 5 pounds in one week do ankle pumps when sitting keep legs up while sitting track weight in diary use salt in moderation watch for swelling in feet, ankles and legs every day bring diary to all appointments develop a rescue plan follow rescue plan if symptoms flare-up know when to call the doctor:ongoign swelling that is not resolved after dialysis track symptoms and what helps feel better or worse Document weekly weights from the dialysis center for HF monitoring  Follow Up Plan:  Telephone follow up appointment with care management team member scheduled for:  July 2023 The patient has been provided with contact information for the care management team and has been advised to call with any health  related questions or concerns.         , RN Care Management Coordinator [facility] Main Office [phone]    

## 2021-07-10 NOTE — ED Notes (Signed)
Lab called about the troponin sent, stated they are running it now.

## 2021-07-11 DIAGNOSIS — N2581 Secondary hyperparathyroidism of renal origin: Secondary | ICD-10-CM | POA: Diagnosis not present

## 2021-07-11 DIAGNOSIS — Z992 Dependence on renal dialysis: Secondary | ICD-10-CM | POA: Diagnosis not present

## 2021-07-11 DIAGNOSIS — N186 End stage renal disease: Secondary | ICD-10-CM | POA: Diagnosis not present

## 2021-07-12 DIAGNOSIS — E785 Hyperlipidemia, unspecified: Secondary | ICD-10-CM | POA: Diagnosis not present

## 2021-07-12 DIAGNOSIS — G459 Transient cerebral ischemic attack, unspecified: Secondary | ICD-10-CM | POA: Diagnosis not present

## 2021-07-12 DIAGNOSIS — N186 End stage renal disease: Secondary | ICD-10-CM | POA: Diagnosis not present

## 2021-07-12 DIAGNOSIS — E1122 Type 2 diabetes mellitus with diabetic chronic kidney disease: Secondary | ICD-10-CM | POA: Diagnosis not present

## 2021-07-12 DIAGNOSIS — I10 Essential (primary) hypertension: Secondary | ICD-10-CM | POA: Diagnosis not present

## 2021-07-13 DIAGNOSIS — Z992 Dependence on renal dialysis: Secondary | ICD-10-CM | POA: Diagnosis not present

## 2021-07-13 DIAGNOSIS — N186 End stage renal disease: Secondary | ICD-10-CM | POA: Diagnosis not present

## 2021-07-13 DIAGNOSIS — N2581 Secondary hyperparathyroidism of renal origin: Secondary | ICD-10-CM | POA: Diagnosis not present

## 2021-07-15 DIAGNOSIS — N2581 Secondary hyperparathyroidism of renal origin: Secondary | ICD-10-CM | POA: Diagnosis not present

## 2021-07-15 DIAGNOSIS — N186 End stage renal disease: Secondary | ICD-10-CM | POA: Diagnosis not present

## 2021-07-15 DIAGNOSIS — Z992 Dependence on renal dialysis: Secondary | ICD-10-CM | POA: Diagnosis not present

## 2021-07-18 DIAGNOSIS — N186 End stage renal disease: Secondary | ICD-10-CM | POA: Diagnosis not present

## 2021-07-18 DIAGNOSIS — N2581 Secondary hyperparathyroidism of renal origin: Secondary | ICD-10-CM | POA: Diagnosis not present

## 2021-07-18 DIAGNOSIS — Z992 Dependence on renal dialysis: Secondary | ICD-10-CM | POA: Diagnosis not present

## 2021-07-20 DIAGNOSIS — N186 End stage renal disease: Secondary | ICD-10-CM | POA: Diagnosis not present

## 2021-07-20 DIAGNOSIS — N2581 Secondary hyperparathyroidism of renal origin: Secondary | ICD-10-CM | POA: Diagnosis not present

## 2021-07-20 DIAGNOSIS — Z992 Dependence on renal dialysis: Secondary | ICD-10-CM | POA: Diagnosis not present

## 2021-07-22 DIAGNOSIS — Z992 Dependence on renal dialysis: Secondary | ICD-10-CM | POA: Diagnosis not present

## 2021-07-22 DIAGNOSIS — N186 End stage renal disease: Secondary | ICD-10-CM | POA: Diagnosis not present

## 2021-07-22 DIAGNOSIS — N2581 Secondary hyperparathyroidism of renal origin: Secondary | ICD-10-CM | POA: Diagnosis not present

## 2021-07-25 DIAGNOSIS — N186 End stage renal disease: Secondary | ICD-10-CM | POA: Diagnosis not present

## 2021-07-25 DIAGNOSIS — N2581 Secondary hyperparathyroidism of renal origin: Secondary | ICD-10-CM | POA: Diagnosis not present

## 2021-07-25 DIAGNOSIS — Z992 Dependence on renal dialysis: Secondary | ICD-10-CM | POA: Diagnosis not present

## 2021-07-26 ENCOUNTER — Ambulatory Visit (INDEPENDENT_AMBULATORY_CARE_PROVIDER_SITE_OTHER): Payer: Medicare PPO | Admitting: Podiatry

## 2021-07-26 DIAGNOSIS — B351 Tinea unguium: Secondary | ICD-10-CM

## 2021-07-26 DIAGNOSIS — E1151 Type 2 diabetes mellitus with diabetic peripheral angiopathy without gangrene: Secondary | ICD-10-CM | POA: Diagnosis not present

## 2021-07-26 DIAGNOSIS — M79674 Pain in right toe(s): Secondary | ICD-10-CM

## 2021-07-26 DIAGNOSIS — M79675 Pain in left toe(s): Secondary | ICD-10-CM | POA: Diagnosis not present

## 2021-07-27 DIAGNOSIS — N186 End stage renal disease: Secondary | ICD-10-CM | POA: Diagnosis not present

## 2021-07-27 DIAGNOSIS — Z992 Dependence on renal dialysis: Secondary | ICD-10-CM | POA: Diagnosis not present

## 2021-07-27 DIAGNOSIS — N2581 Secondary hyperparathyroidism of renal origin: Secondary | ICD-10-CM | POA: Diagnosis not present

## 2021-07-28 ENCOUNTER — Ambulatory Visit: Payer: Medicare Other | Admitting: Cardiovascular Disease

## 2021-07-29 DIAGNOSIS — N186 End stage renal disease: Secondary | ICD-10-CM | POA: Diagnosis not present

## 2021-07-29 DIAGNOSIS — N2581 Secondary hyperparathyroidism of renal origin: Secondary | ICD-10-CM | POA: Diagnosis not present

## 2021-07-29 DIAGNOSIS — Z992 Dependence on renal dialysis: Secondary | ICD-10-CM | POA: Diagnosis not present

## 2021-08-01 DIAGNOSIS — Z992 Dependence on renal dialysis: Secondary | ICD-10-CM | POA: Diagnosis not present

## 2021-08-01 DIAGNOSIS — N186 End stage renal disease: Secondary | ICD-10-CM | POA: Diagnosis not present

## 2021-08-01 DIAGNOSIS — N2581 Secondary hyperparathyroidism of renal origin: Secondary | ICD-10-CM | POA: Diagnosis not present

## 2021-08-02 NOTE — Progress Notes (Signed)
  Subjective:  Patient ID: Julie Brewer, female    DOB: 1950/07/06,  MRN: [id number]  Chief Complaint  Patient presents with   Diabetes   71 y.o. female returns for the above complaint.  Patient is a type II diabetic whose A1c is 6.1.  Patient presents with thickened elongated mycotic toenails x10.  Patient states that she is not able to cut it herself.  She would like to bring [place] to debride them down.  She denies any other acute complaints.  Objective:  There were no vitals filed for this visit. Podiatric Exam: Vascular: dorsalis pedis and posterior tibial pulses are palpable bilateral. Capillary return is immediate. Temperature gradient is WNL. Skin turgor WNL  Sensorium: Normal Semmes Weinstein monofilament test. Normal tactile sensation bilaterally. Nail Exam: Pt has thick disfigured discolored nails with subungual debris noted bilateral entire nail hallux through fifth toenails Ulcer Exam: There is no evidence of ulcer or pre-ulcerative changes or infection. Orthopedic Exam: Muscle tone and strength are WNL. No limitations in general ROM. No crepitus or effusions noted. HAV  B/L.  Hammer toes 2-5  B/L. Skin: No Porokeratosis. No infection or ulcers.  Submet 3 hyperkeratotic lesion/callus x2.  Pain on palpation.  Assessment & Plan:  Patient was evaluated and treated and all questions answered.  Hammertoe contractures bilateral two through five -I explained to patient the etiology of hammertoe contracture as well as treatment options were discussed. Given that patient has underlying diabetes with contractures of the hammertoes I believe patient will benefit from diabetic shoes to prevent ulceration leading to amputation. Patient agrees with the plan. -Patient is currently wearing diabetic shoes.  No acute complaints noted.  Bilateral submet 3 hyperkeratotic lesion/callus x2 -Resolving  Onychomycosis with pain  -Nails palliatively debrided as below. -Educated on  self-care  Procedure: Nail Debridement Rationale: pain  Type of Debridement: manual, sharp debridement. Instrumentation: Nail nipper, rotary burr. Number of Nails: 10  Procedures and Treatment: Consent by patient was obtained for treatment procedures. The patient understood the discussion of treatment and procedures well. All questions were answered thoroughly reviewed. Debridement of mycotic and hypertrophic toenails, 1 through 5 bilateral and clearing of subungual debris. No ulceration, no infection noted.  Return Visit-Office Procedure: Patient instructed to return to the office for a follow up visit 3 months for continued evaluation and treatment.  Boneta Lucks, DPM    No follow-ups on file.

## 2021-08-03 DIAGNOSIS — N186 End stage renal disease: Secondary | ICD-10-CM | POA: Diagnosis not present

## 2021-08-03 DIAGNOSIS — Z992 Dependence on renal dialysis: Secondary | ICD-10-CM | POA: Diagnosis not present

## 2021-08-03 DIAGNOSIS — N2581 Secondary hyperparathyroidism of renal origin: Secondary | ICD-10-CM | POA: Diagnosis not present

## 2021-08-05 DIAGNOSIS — N186 End stage renal disease: Secondary | ICD-10-CM | POA: Diagnosis not present

## 2021-08-05 DIAGNOSIS — N2581 Secondary hyperparathyroidism of renal origin: Secondary | ICD-10-CM | POA: Diagnosis not present

## 2021-08-05 DIAGNOSIS — Z992 Dependence on renal dialysis: Secondary | ICD-10-CM | POA: Diagnosis not present

## 2021-08-07 DIAGNOSIS — E1165 Type 2 diabetes mellitus with hyperglycemia: Secondary | ICD-10-CM | POA: Diagnosis not present

## 2021-08-08 DIAGNOSIS — Z992 Dependence on renal dialysis: Secondary | ICD-10-CM | POA: Diagnosis not present

## 2021-08-08 DIAGNOSIS — N186 End stage renal disease: Secondary | ICD-10-CM | POA: Diagnosis not present

## 2021-08-08 DIAGNOSIS — N2581 Secondary hyperparathyroidism of renal origin: Secondary | ICD-10-CM | POA: Diagnosis not present

## 2021-08-09 ENCOUNTER — Other Ambulatory Visit: Payer: Self-pay | Admitting: *Deleted

## 2021-08-09 NOTE — Patient Outreach (Signed)
  Care Coordination   Follow Up Visit Note   08/09/2021 Name: [name] MRN: [id number] DOB: 1950-06-01  Julie Brewer is a 71 y.o. year old female who sees Lindell Noe, Anastasia Pall, MD for primary care. I spoke with  Julie Brewer by phone today  What matters to the patients health and wellness today?  Pt had no issues presented and verified her reading via HF 132 lbs no retention and DM (CBGS 125-130) and remains asymptomatic. Along with pending appointments. Next provider visit on 7/17 with Dr. Lindell Noe.   Goals Addressed   None     SDOH assessments and interventions completed:   No   Care Coordination Interventions Activated:  Yes Care Coordination Interventions:   Yes, provided  Follow up plan: No further intervention required.  Encounter Outcome:  Pt. Visit Completed  Further assessment: Pt reports no needs with CBGs ranging from 125-130 AM reads. Pt states the pharmacist at her provider's office is working with her for a more advance meter. Pt states she is currently using her Orlene Plum with active read-outs. Pt confirms ongoing dialysis T-TH-S with no issues. Pt remains at 132 with no fluid retention with her HF monitoring with no active medications for this condition.  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 Tetanus/TDAP pt repots she has completed this test however not sure when she last had this test. RN encouraged pt to discussed if she needs an updated vaccine with a booster on her upcoming appointment. Shingrix pt reports she received this vaccine within the last 5 years at her provider's office.  No other issues or interventions presented as this case will be closed and pt remains aware on how to reach this RN case manager or contact her provider's office for further assistance if needed in the future. Case will be closed at this time.  Raina Mina, RN Care Management Coordinator [place] Office [phone]

## 2021-08-10 DIAGNOSIS — N186 End stage renal disease: Secondary | ICD-10-CM | POA: Diagnosis not present

## 2021-08-10 DIAGNOSIS — Z992 Dependence on renal dialysis: Secondary | ICD-10-CM | POA: Diagnosis not present

## 2021-08-10 DIAGNOSIS — N2581 Secondary hyperparathyroidism of renal origin: Secondary | ICD-10-CM | POA: Diagnosis not present

## 2021-08-12 DIAGNOSIS — Z992 Dependence on renal dialysis: Secondary | ICD-10-CM | POA: Diagnosis not present

## 2021-08-12 DIAGNOSIS — N186 End stage renal disease: Secondary | ICD-10-CM | POA: Diagnosis not present

## 2021-08-12 DIAGNOSIS — N2581 Secondary hyperparathyroidism of renal origin: Secondary | ICD-10-CM | POA: Diagnosis not present

## 2021-08-14 ENCOUNTER — Encounter: Payer: Self-pay | Admitting: Neurology

## 2021-08-14 ENCOUNTER — Other Ambulatory Visit (INDEPENDENT_AMBULATORY_CARE_PROVIDER_SITE_OTHER): Payer: Medicare PPO

## 2021-08-14 ENCOUNTER — Ambulatory Visit (INDEPENDENT_AMBULATORY_CARE_PROVIDER_SITE_OTHER): Payer: Medicare PPO | Admitting: Neurology

## 2021-08-14 VITALS — BP 172/61 | HR 90 | Ht <= 58 in | Wt 128.0 lb

## 2021-08-14 DIAGNOSIS — Z794 Long term (current) use of insulin: Secondary | ICD-10-CM | POA: Diagnosis not present

## 2021-08-14 DIAGNOSIS — R55 Syncope and collapse: Secondary | ICD-10-CM

## 2021-08-14 DIAGNOSIS — E1165 Type 2 diabetes mellitus with hyperglycemia: Secondary | ICD-10-CM

## 2021-08-14 LAB — GLUCOSE, RANDOM: Glucose, Bld: 79 mg/dL (ref 70–99)

## 2021-08-14 NOTE — Progress Notes (Signed)
NEUROLOGY FOLLOW UP OFFICE NOTE  Julie Brewer [id number] [birth year]  HISTORY OF PRESENT ILLNESS: I had the pleasure of seeing Julie Brewer in follow-up in the neurology clinic on 08/14/2021.  She is again accompanied by her daughter [name] who helps supplement the history today. The patient was last seen 3 months ago for recurrent episodes of loss of consciousness. Cardiac and neurological workup have been unrevealing. Her 6-day inpatient video EEG monitoring in March 2023 showed normal baseline EEG. Typical events were not captured, we discussed low likelihood of epilepsy and agreed to wean off the Lamotrigine. Differentials include non-epileptic events as daughter reported most of them occur when smoking, versus hemodynamic instability due to HD. They deny any episodes of loss of consciousness off Lamotrigine. She was in the ER last 6/12 when her legs gave out as she was coming in the house. No loss of consciousness. They state she has been told they are TIAs. She feels her right leg is weaker. No headaches, dizziness, focal numbness/tingling. She continues to smoke. Sleep is okay.    History on Initial Assessment 02/05/2020: This is a pleasant 71 year old right-handed woman with a history of hypertension, hyperlipidemia, diabetes, CKD on dialysis, legally blind, stroke in 2010 with residual mild left-sided weakness, presenting for evaluation of recurrent episodes of loss of consciousness. The episodes started around 2-3 years ago. There is no prior warning, she notes that she is eating when they happen most of the time. She has been living with her daughter [name] since March 2021, and they report episodes around once a month, however she had 2 in December, last episode was 01/23/20. [name] reports they occur either when eating breakfast or dinner, she would make a noise like a growl, then starts falling over to the right side. She is staring with eyes open, not responding, and would  be out for a minute. No jerking/convulsive activity seen. She would be a little disoriented and clammy after. She feels tired and would feel the urge to have a bowel movement. She has had urinary incontinence with some of the episodes. They report a bigger episode in January 2021 when she was more confused and did not recognize her family after, she was brought to [facility], there was note of episodes of hypoglycemia in the past, she had a loop recorder due to these episodes which was normal on interrogation. She denies any olfactory/gustatory hallucinations, deja vu, rising epigastric sensation, focal numbness/tingling/weakness, myoclonic jerks. She denies any significant headaches, dizziness, dysarthria/dysphagia. She lost her vision in 2011. She has dialysis T-Th-F. Her mother used to have seizures and was found to have a brain tumor. Otherwise she had a normal birth and early development.  There is no history of febrile convulsions, CNS infections such as meningitis/encephalitis, significant traumatic brain injury, neurosurgical procedures.  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   Her routine and 24-hour EEGs in January/February 2022 were normal, EKG showed irregular rhythm, typical events were not captured.   She has had a loop recorder since 2018 with no changes during events.    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   CHF (congestive heart failure) (HCC)    Chronic kidney disease  Tu/Th/Sa   Diabetes mellitus    Type II per Dr [name]  (patient said type I)   Fibroid    GERD (gastroesophageal reflux disease)    Glaucoma    Hyperlipidemia    Hypertension    Iron deficiency anemia 03/09/2016   Peripheral  vascular disease ([place])    Pneumonia 2006   PONV (postoperative nausea and vomiting)    Seizure disorder (HCC)    Shortness of breath dyspnea    with exdrtion, "Walkling too fast"   Stroke ([place])    no residual    MEDICATIONS: Current Outpatient Medications on File Prior to Visit  Medication Sig Dispense Refill   acetaminophen (TYLENOL) 325 MG tablet Take 2 tablets (650 mg total) by mouth every 6 (six) hours as needed for mild pain (or Fever >/= 101).     amLODipine (NORVASC) 5 MG tablet Take 5 mg by mouth daily.     aspirin 325 MG EC tablet Take 1 tablet (325 mg total) by mouth daily.     atorvastatin (LIPITOR) 20 MG tablet Take 1 tablet by mouth once daily 90 tablet 0   bisacodyl (DULCOLAX) 5 MG EC tablet Take 5 mg by mouth daily as needed for moderate constipation.     Blood Glucose Monitoring Suppl (FREESTYLE FREEDOM) KIT Use to check blood sugar once a day dx code 1 kit 1   cilostazol (PLETAL) 100 MG tablet Take 1 tablet by mouth twice daily (Patient taking differently: One time daily) 180 tablet 0   Continuous Blood Gluc Receiver (DEXCOM G6 RECEIVER) DEVI Use to display glucose from sensor 1 each 0   Continuous Blood Gluc Sensor (DEXCOM G6 SENSOR) MISC Use to monitor blood sugar, change after 10 days 3 each 3   docusate sodium (COLACE) 100 MG capsule Take 200 mg by mouth daily as needed for mild constipation.     Doxercalciferol (HECTOROL IV) Inject 1 Dose into the vein See admin instructions. During dialysis - Tuesday, Thursday and Saturday     gabapentin (NEURONTIN) 100 MG capsule TAKE 1 CAPSULE BY MOUTH TWICE DAILY AS NEEDED (Patient taking differently: 100 mg 2 (two) times daily as needed (neuropathy).) 180 capsule 2   glucose blood (FREESTYLE LITE) test strip USE AS INSTRUCTED TO CHECK BLOOD SUGAR ONCE DAILY. 50 each 3   heparin 1000 unit/mL SOLN injection 1,000 Units by Dialysis route See admin instructions. During dialysis     Insulin Pen Needle 31G X 5 MM MISC Use with pen 100  each 1   insulin regular (NOVOLIN R) 100 units/mL injection Inject 7-10 Units into the skin See admin instructions. Taking 8 units with breakfast and 7 units with dinner     iron sucrose in sodium chloride 0.9 % 100 mL Inject 100 mg into the vein See admin instructions. During dialysis     lidocaine-prilocaine (EMLA) cream 1 application. daily as needed (port access).     pantoprazole (PROTONIX) 40 MG tablet Take 1 tablet (40 mg) by mouth twice daily for 4 weeks, then switch to 1 tablet once daily. (Patient taking differently: Take 40 mg by mouth daily.) 60 tablet 3   Vitamin D, Ergocalciferol, (DRISDOL) 1.25 MG (50000 UNIT) CAPS capsule Take 1 capsule by mouth once a week (Patient taking differently: Provided at dialysis) 12 capsule 0   insulin glargine (LANTUS) 100 UNIT/ML injection Inject 0.12 mLs (12 Units total) into the skin daily. (Patient not taking: Reported on 08/14/2021)     triamcinolone ointment (KENALOG) 0.1 % 1  application. daily. (Patient not taking: Reported on 08/14/2021)     No current facility-administered medications on file prior to visit.    ALLERGIES: Allergies  Allergen Reactions   Morphine And Related Other (See Comments)    Hallucinations    Penicillins Swelling and Rash    Throat swelling Did it involve swelling of the face/tongue/throat, SOB, or low BP? Yes Did it involve sudden or severe rash/hives, skin peeling, or any reaction on the inside of your mouth or nose? Yes Did you need to seek medical attention at a hospital or doctor's office? Yes When did it last happen?   young child    If all above answers are "NO", may proceed with cephalosporin use.    FAMILY HISTORY: Family History  Problem Relation Age of Onset   Cancer Mother    Heart disease Mother    Diabetes Father    Cancer Brother    Cancer Brother    Throat cancer Brother     SOCIAL HISTORY: Social History   Socioeconomic History   Marital status: Legally Separated    Spouse name: Not  on file   Number of children: Not on file   Years of education: Not on file   Highest education level: Not on file  Occupational History   Not on file  Tobacco Use   Smoking status: Every Day    Packs/day: 1.00    Years: 30.00    Total pack years: 30.00    Types: Cigarettes   Smokeless tobacco: Never   Tobacco comments:    8- 10 cig a day  Vaping Use   Vaping Use: Never used  Substance and Sexual Activity   Alcohol use: Not Currently    Comment: socially   Drug use: Not Currently    Types: Methylphenidate   Sexual activity: Never    Birth control/protection: Post-menopausal, Surgical    Comment: Hysterectomy  Other Topics Concern   Not on file  Social History Narrative   Right handed   Lives with daughter in a one story home   Drinks caffeine   Social Determinants of Health   Financial Resource Strain: Not on file  Food Insecurity: No Food Insecurity (05/10/2021)   Hunger Vital Sign    Worried About Running Out of Food in the Last Year: Never true    Ran Out of Food in the Last Year: Never true  Transportation Needs: No Transportation Needs (05/10/2021)   PRAPARE - Hydrologist (Medical): No    Lack of Transportation (Non-Medical): No  Physical Activity: Not on file  Stress: Not on file  Social Connections: Not on file  Intimate Partner Violence: Not on file     PHYSICAL EXAM: Vitals:   08/14/21 1507  BP: (!) 172/61  Pulse: 90  SpO2: 97%   General: No acute distress Head:  Normocephalic/atraumatic Skin/Extremities: No rash, no edema Neurological Exam: alert and awake. No aphasia or dysarthria. Fund of knowledge is appropriate. Attention and concentration are normal.   Cranial nerves: Legally blind. No facial asymmetry.  Motor: Bulk and tone normal, muscle strength 5/5 throughout with no pronator drift.   Finger to nose testing intact.  Gait slow and cautious with cane, favoring right leg, no ataxia.   IMPRESSION: This is a  pleasant 71 yo RH woman with a history of hypertension, hyperlipidemia, diabetes, CKD on dialysis, legally blind, stroke in 2010 with residual mild left-sided weakness, with recurrent episodes of loss of consciousness where  she is described as making a gurgling sound, staring, unresponsive for a minute, followed by confusion. She has had an unremarkable cardiac evaluation and has had a loop recorder since 2018, captured episodes have not shown any significant arrhythmia. Brain MRI and 6-day inpatient video EEG monitoring off Lamotrigine did not show any indication of underlying epilepsy as cause of symptoms. She has been off Lamotrigine with no episodes of loss of consciousness. We again discussed that hypotension is still a possibility. She was in the ER last month after both legs gave out, they were told it was a TIA, I do not see this documented. I discussed with them that it may be due to hypoperfusion and would continue to monitor BP, continue daily aspirin, control of vascular risk factors, smoking cessation. Follow-up as needed, call for any changes.    Thank you for allowing me to participate in her care.  Please do not hesitate to call for any questions or concerns.    Ellouise Newer, M.D.   CC: Dr. [name], Dr. Lindell Noe

## 2021-08-14 NOTE — Patient Instructions (Signed)
Good to see you. Continue control of blood pressure, cholesterol, sugar levels, working on stopping smoking. Follow-up as needed, call for any changes.

## 2021-08-15 DIAGNOSIS — N2581 Secondary hyperparathyroidism of renal origin: Secondary | ICD-10-CM | POA: Diagnosis not present

## 2021-08-15 DIAGNOSIS — N186 End stage renal disease: Secondary | ICD-10-CM | POA: Diagnosis not present

## 2021-08-15 DIAGNOSIS — Z992 Dependence on renal dialysis: Secondary | ICD-10-CM | POA: Diagnosis not present

## 2021-08-15 LAB — FRUCTOSAMINE: Fructosamine: 280 umol/L (ref 0–285)

## 2021-08-16 ENCOUNTER — Encounter: Payer: Self-pay | Admitting: Endocrinology

## 2021-08-16 ENCOUNTER — Other Ambulatory Visit: Payer: Self-pay | Admitting: Vascular Surgery

## 2021-08-16 ENCOUNTER — Ambulatory Visit (INDEPENDENT_AMBULATORY_CARE_PROVIDER_SITE_OTHER): Payer: Medicare PPO | Admitting: Endocrinology

## 2021-08-16 VITALS — BP 142/68 | HR 86 | Ht 63.0 in | Wt 126.6 lb

## 2021-08-16 DIAGNOSIS — E1165 Type 2 diabetes mellitus with hyperglycemia: Secondary | ICD-10-CM | POA: Diagnosis not present

## 2021-08-16 DIAGNOSIS — Z794 Long term (current) use of insulin: Secondary | ICD-10-CM

## 2021-08-16 NOTE — Progress Notes (Signed)
Patient ID: Julie Brewer, female   DOB: [birth year], 71 y.o.   MRN: [id number]   Reason for Appointment: Follow-up of various problems  History of Present Illness    Diagnosis: Type 2 DIABETES MELITUS, date of diagnosis:  1985  Prior history: She has been on insulin since diagnosis and on Lantus previously Also at some point had been started on Glucophage several years ago also Because of insurance preference Lantus was changed to Levemir  She refuses to use analog rapid acting insulin because of cost and is using regular insulin for several years   Her blood sugars are generally well controlled and A1c usually under 7% Her A1c previously was higher with stopping metformin at 8%  Recent history:   Insulin regimen:  Regular insulin 20-30 minutes Before eating, 12 units a.m. and 12 ac supper  Oral hypoglycemic drugs: None   Current blood sugar patterns, management and problems:  Her A1c is last 4.8 compared to 6.1  Fructosamine is 280  She is using her freestyle libre sensor for the last 5 or 6 days but mostly checking blood sugars in the morning and sometimes in the afternoon but not after dinner Because of this blood sugar data from evenings is not available  Her CGM appears to be fairly close to the lab glucose on the same day around 3 PM  Because of tendency to overnight hypoglycemia LANTUS insulin was stopped in 5/23  Currently her overnight blood sugars appear to be fairly good and averaging as low as 100 during the night without hypoglycemia  POSTPRANDIAL readings appear to be periodically higher after breakfast but she has tendency to relatively low readings in the early afternoon and today was 68 after breakfast in the office  Unclear whether her blood sugars are consistent after dinner with 1 episode of high readings and 1 episode of low normal reading after dinner  Again usually getting oatmeal in the morning and has not had any protein Her weight is  about the same or slightly lower  Her daughter is drawing up her insulin with the syringe   Side effects from medications: None      Glucometer:  FreeStyle libre  Blood sugar data as above, recent average glucose 122 but only 20% active CGM time  Previous data:  CGM use % of time 44  2-week average/GV 104/39  Time in range 80      %  % Time Above 180 8  % Time above 250   % Time Below 70 12 was 26     PRE-MEAL Fasting Lunch Dinner 12 AM-2 AM Overall  Glucose range:       Averages: 86   55 104   POST-MEAL PC Breakfast PC Lunch PC Dinner  Glucose range:     Averages: 150  122     Meals:  usually 2 meals per day at 10 AM and 5 PM.     Mealtime protein sources:turkey, chicken.  Eating cereal for breakfast Or oatmeal Avoiding sweet drinks  Physical activity: exercise: Some walking within the house             Wt Readings from Last 3 Encounters:  08/16/21 126 lb 9.6 oz (57.4 kg)  08/14/21 128 lb (58.1 kg)  06/14/21 132 lb (59.9 kg)   Lab Results  Component Value Date   HGBA1C 4.8 05/29/2021   HGBA1C 6.1 (H) 04/07/2021   HGBA1C 6.3 02/03/2021   Lab Results  Component Value Date  MICROALBUR 196.7 (H) 03/11/2018   LDLCALC 58 05/29/2021   CREATININE 5.61 (H) 07/10/2021      Lab Results  Component Value Date   FRUCTOSAMINE 280 08/14/2021   FRUCTOSAMINE 282 05/29/2021   FRUCTOSAMINE 295 (H) 02/03/2021   FRUCTOSAMINE 275 05/18/2020    Other active problems: See review of systems      Allergies as of 08/16/2021       Reactions   Morphine And Related Other (See Comments)   Hallucinations    Penicillins Swelling, Rash   Throat swelling Did it involve swelling of the face/tongue/throat, SOB, or low BP? Yes Did it involve sudden or severe rash/hives, skin peeling, or any reaction on the inside of your mouth or nose? Yes Did you need to seek medical attention at a hospital or doctor's office? Yes When did it last happen?   young child    If all above  answers are "NO", may proceed with cephalosporin use.        Medication List        Accurate as of August 16, 2021 11:18 AM. If you have any questions, ask your nurse or doctor.          acetaminophen 325 MG tablet Commonly known as: TYLENOL Take 2 tablets (650 mg total) by mouth every 6 (six) hours as needed for mild pain (or Fever >/= 101).   amLODipine 5 MG tablet Commonly known as: NORVASC Take 5 mg by mouth daily.   aspirin EC 325 MG tablet Take 1 tablet (325 mg total) by mouth daily.   atorvastatin 20 MG tablet Commonly known as: LIPITOR Take 1 tablet by mouth once daily   bisacodyl 5 MG EC tablet Commonly known as: DULCOLAX Take 5 mg by mouth daily as needed for moderate constipation.   cilostazol 100 MG tablet Commonly known as: PLETAL Take 1 tablet by mouth twice daily What changed:  how much to take how to take this when to take this additional instructions   Dexcom G6 Receiver Devi Use to display glucose from sensor   Dexcom G6 Sensor Misc Use to monitor blood sugar, change after 10 days   docusate sodium 100 MG capsule Commonly known as: COLACE Take 200 mg by mouth daily as needed for mild constipation.   FreeStyle Freedom Kit Use to check blood sugar once a day dx code   FREESTYLE LITE test strip Generic drug: glucose blood USE AS INSTRUCTED TO CHECK BLOOD SUGAR ONCE DAILY.   gabapentin 100 MG capsule Commonly known as: NEURONTIN TAKE 1 CAPSULE BY MOUTH TWICE DAILY AS NEEDED What changed: See the new instructions.   HECTOROL IV Inject 1 Dose into the vein See admin instructions. During dialysis - Tuesday, Thursday and Saturday   heparin 1000 unit/mL Soln injection 1,000 Units by Dialysis route See admin instructions. During dialysis   insulin glargine 100 UNIT/ML injection Commonly known as: Lantus Inject 0.12 mLs (12 Units total) into the skin daily.   Insulin Pen Needle 31G X 5 MM Misc Use with pen   insulin regular 100  units/mL injection Commonly known as: NOVOLIN R Inject 7-10 Units into the skin See admin instructions.  12 units with breakfast and 12 units with dinner   iron sucrose in sodium chloride 0.9 % 100 mL Inject 100 mg into the vein See admin instructions. During dialysis   lidocaine-prilocaine cream Commonly known as: EMLA 1 application. daily as needed (port access).   pantoprazole 40 MG tablet Commonly known as: PROTONIX  Take 1 tablet (40 mg) by mouth twice daily for 4 weeks, then switch to 1 tablet once daily. What changed:  how much to take how to take this when to take this additional instructions   triamcinolone ointment 0.1 % Commonly known as: KENALOG 1 application  daily.   Vitamin D (Ergocalciferol) 1.25 MG (50000 UNIT) Caps capsule Commonly known as: DRISDOL Take 1 capsule by mouth once a week What changed:  how much to take how to take this when to take this additional instructions        Allergies:  Allergies  Allergen Reactions   Morphine And Related Other (See Comments)    Hallucinations    Penicillins Swelling and Rash    Throat swelling Did it involve swelling of the face/tongue/throat, SOB, or low BP? Yes Did it involve sudden or severe rash/hives, skin peeling, or any reaction on the inside of your mouth or nose? Yes Did you need to seek medical attention at a hospital or doctor's office? Yes When did it last happen?   young child    If all above answers are "NO", may proceed with cephalosporin use.    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   CHF (congestive heart failure) (HCC)    Chronic kidney disease    Tu/Th/Sa   Diabetes mellitus    Type II per Dr [name]  (patient said type I)   Fibroid    GERD (gastroesophageal reflux disease)    Glaucoma    Hyperlipidemia    Hypertension    Iron deficiency anemia 03/09/2016    Peripheral vascular disease ([place])    Pneumonia 2006   PONV (postoperative nausea and vomiting)    Seizure disorder (HCC)    Shortness of breath dyspnea    with exdrtion, "Walkling too fast"   Stroke ([place])    no residual    Past Surgical History:  Procedure Laterality Date   ABDOMINAL HYSTERECTOMY     AV FISTULA PLACEMENT Left 04/27/2019   Procedure: INSERTION OF ARTERIOVENOUS (AV) GORE-TEX GRAFT LEFT UPPER ARM;  Surgeon: Angelia Mould, MD;  Location: [place];  Service: Vascular;  Laterality: Left;   BIOPSY  06/10/2017   Procedure: BIOPSY;  Surgeon: Ronnette Juniper, MD;  Location: [place];  Service: Gastroenterology;;   BIOPSY  04/10/2021   Procedure: BIOPSY;  Surgeon: Otis Brace, MD;  Location: [place];  Service: Gastroenterology;;   BREAST BIOPSY Right    CERVICAL FUSION     with graft from hip   COLONOSCOPY  July 09, 2012   DIRECT LARYNGOSCOPY N/A 06/07/2014   Procedure: DIRECT LARYNGOSCOPY with BIOPSY and EXCISION VOLLECULAR CYST;  Surgeon: Ruby Cola, MD;  Location: [place];  Service: ENT;  Laterality: N/A;   ESOPHAGOGASTRODUODENOSCOPY (EGD) WITH PROPOFOL Left 06/10/2017   Procedure: ESOPHAGOGASTRODUODENOSCOPY (EGD) WITH PROPOFOL;  Surgeon: Ronnette Juniper, MD;  Location: [place];  Service: Gastroenterology;  Laterality: Left;   ESOPHAGOGASTRODUODENOSCOPY (EGD) WITH PROPOFOL N/A 04/10/2021   Procedure: ESOPHAGOGASTRODUODENOSCOPY (EGD) WITH PROPOFOL;  Surgeon: Otis Brace, MD;  Location: [place];  Service: Gastroenterology;  Laterality: N/A;   FLEXIBLE SIGMOIDOSCOPY Left 06/10/2017   Procedure: FLEXIBLE SIGMOIDOSCOPY;  Surgeon: Ronnette Juniper, MD;  Location: [place];  Service: Gastroenterology;  Laterality: Left;   HOT HEMOSTASIS N/A 04/10/2021   Procedure: HOT HEMOSTASIS (ARGON PLASMA COAGULATION/BICAP);  Surgeon: Otis Brace,  MD;  Location: [place];  Service: Gastroenterology;  Laterality: N/A;   LOOP RECORDER INSERTION N/A 06/20/2016    Procedure: Loop Recorder Insertion;  Surgeon: [name], MD;  Location: [facility];  Service: Cardiovascular;  Laterality: N/A;   REFRACTIVE SURGERY Bilateral    both eyes   SPINE SURGERY     lumbar    Family History  Problem Relation Age of Onset   Cancer Mother    Heart disease Mother    Diabetes Father    Cancer Brother    Cancer Brother    Throat cancer Brother     Social History:  reports that she has been smoking cigarettes. She has a 30.00 pack-year smoking history. She has never used smokeless tobacco. She reports that she does not currently use alcohol. She reports that she does not currently use drugs after having used the following drugs: Methylphenidate.  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     HYPERLIPIDEMIA: The lipid abnormality consists of elevated LDL controlled with Lipitor 20 mg .   Her baseline LDL was 202  LDL is consistently well controlled and triglycerides have been normal .  Lab Results  Component Value Date   CHOL 146 05/29/2021   HDL 67.40 05/29/2021   LDLCALC 58 05/29/2021   TRIG 100.0 05/29/2021   CHOLHDL 2 05/29/2021       Last diabetic foot exam was done in 8/22 by podiatrist She has some vascular disease on the left side followed by vascular specialist  Has been taking gabapentin 118m twice daily for lower leg leg pains with relief Has numbness in feet   She has had her Covid vaccines   Examination:   BP (!) 142/68 (BP Location: Left Arm, Patient Position: Sitting, Cuff Size: Normal)   Pulse 86   Ht _0  (1.6 m)   Wt 126 lb 9.6 oz (57.4 kg)   SpO2 96%   BMI 22.43 kg/m   Body mass index is 22.43 kg/m.    ASSESSMENT/  PLAN:     Diabetes type 2 on insulin See history of present illness for detailed discussion of current diabetes management, blood sugar patterns and problems identified  Her A1c is falsely low previously at 4.8  She is on basal bolus insulin with Lantus and regular insulin  Her A1c may not be accurate because of anemia and fructosamine is upper normal at 280  She is taking only mealtime insulin 12 units  Because of cost she is only taking regular insulin and she injects 30-minute before eating Blood sugars may be relatively lower after breakfast but her freestyle lElenor Legat[place]ata is very incomplete and not enough to identify patterns  HYPERLIPIDEMIA: LDL well controlled on Lipitor   Recommendations: Her daughter will call [facility] again to see if Dexcom is covered Otherwise she needs to remind herself to check her sugar consistently after supper To call if she is getting low sugars after dinner Reduce morning regular insulin to 10 units instead of 12 for now    There are no Patient Instructions on file for this visit.  AElayne Snare7/19/2023, 11:18 AM     Note: This office note  was prepared with Dragon voice recognition system technology. Any transcriptional errors that result from this process are unintentional.  

## 2021-08-16 NOTE — Patient Instructions (Signed)
R insulin 10 in am and 12 before supper  More sugars after supper  Call [facility]

## 2021-08-17 DIAGNOSIS — N2581 Secondary hyperparathyroidism of renal origin: Secondary | ICD-10-CM | POA: Diagnosis not present

## 2021-08-17 DIAGNOSIS — Z992 Dependence on renal dialysis: Secondary | ICD-10-CM | POA: Diagnosis not present

## 2021-08-17 DIAGNOSIS — N186 End stage renal disease: Secondary | ICD-10-CM | POA: Diagnosis not present

## 2021-08-19 DIAGNOSIS — N2581 Secondary hyperparathyroidism of renal origin: Secondary | ICD-10-CM | POA: Diagnosis not present

## 2021-08-19 DIAGNOSIS — N186 End stage renal disease: Secondary | ICD-10-CM | POA: Diagnosis not present

## 2021-08-19 DIAGNOSIS — Z992 Dependence on renal dialysis: Secondary | ICD-10-CM | POA: Diagnosis not present

## 2021-08-22 DIAGNOSIS — N2581 Secondary hyperparathyroidism of renal origin: Secondary | ICD-10-CM | POA: Diagnosis not present

## 2021-08-22 DIAGNOSIS — N186 End stage renal disease: Secondary | ICD-10-CM | POA: Diagnosis not present

## 2021-08-22 DIAGNOSIS — Z992 Dependence on renal dialysis: Secondary | ICD-10-CM | POA: Diagnosis not present

## 2021-08-24 DIAGNOSIS — Z992 Dependence on renal dialysis: Secondary | ICD-10-CM | POA: Diagnosis not present

## 2021-08-24 DIAGNOSIS — N2581 Secondary hyperparathyroidism of renal origin: Secondary | ICD-10-CM | POA: Diagnosis not present

## 2021-08-24 DIAGNOSIS — N186 End stage renal disease: Secondary | ICD-10-CM | POA: Diagnosis not present

## 2021-08-26 DIAGNOSIS — N2581 Secondary hyperparathyroidism of renal origin: Secondary | ICD-10-CM | POA: Diagnosis not present

## 2021-08-26 DIAGNOSIS — Z992 Dependence on renal dialysis: Secondary | ICD-10-CM | POA: Diagnosis not present

## 2021-08-26 DIAGNOSIS — N186 End stage renal disease: Secondary | ICD-10-CM | POA: Diagnosis not present

## 2021-08-28 DIAGNOSIS — E1122 Type 2 diabetes mellitus with diabetic chronic kidney disease: Secondary | ICD-10-CM | POA: Diagnosis not present

## 2021-08-28 DIAGNOSIS — N186 End stage renal disease: Secondary | ICD-10-CM | POA: Diagnosis not present

## 2021-08-28 DIAGNOSIS — Z992 Dependence on renal dialysis: Secondary | ICD-10-CM | POA: Diagnosis not present

## 2021-08-29 DIAGNOSIS — N186 End stage renal disease: Secondary | ICD-10-CM | POA: Diagnosis not present

## 2021-08-29 DIAGNOSIS — N2581 Secondary hyperparathyroidism of renal origin: Secondary | ICD-10-CM | POA: Diagnosis not present

## 2021-08-29 DIAGNOSIS — Z992 Dependence on renal dialysis: Secondary | ICD-10-CM | POA: Diagnosis not present

## 2021-08-31 DIAGNOSIS — Z992 Dependence on renal dialysis: Secondary | ICD-10-CM | POA: Diagnosis not present

## 2021-08-31 DIAGNOSIS — N186 End stage renal disease: Secondary | ICD-10-CM | POA: Diagnosis not present

## 2021-08-31 DIAGNOSIS — N2581 Secondary hyperparathyroidism of renal origin: Secondary | ICD-10-CM | POA: Diagnosis not present

## 2021-09-02 DIAGNOSIS — N186 End stage renal disease: Secondary | ICD-10-CM | POA: Diagnosis not present

## 2021-09-02 DIAGNOSIS — Z992 Dependence on renal dialysis: Secondary | ICD-10-CM | POA: Diagnosis not present

## 2021-09-02 DIAGNOSIS — N2581 Secondary hyperparathyroidism of renal origin: Secondary | ICD-10-CM | POA: Diagnosis not present

## 2021-09-05 DIAGNOSIS — N2581 Secondary hyperparathyroidism of renal origin: Secondary | ICD-10-CM | POA: Diagnosis not present

## 2021-09-05 DIAGNOSIS — N186 End stage renal disease: Secondary | ICD-10-CM | POA: Diagnosis not present

## 2021-09-05 DIAGNOSIS — Z992 Dependence on renal dialysis: Secondary | ICD-10-CM | POA: Diagnosis not present

## 2021-09-07 DIAGNOSIS — N186 End stage renal disease: Secondary | ICD-10-CM | POA: Diagnosis not present

## 2021-09-07 DIAGNOSIS — N2581 Secondary hyperparathyroidism of renal origin: Secondary | ICD-10-CM | POA: Diagnosis not present

## 2021-09-07 DIAGNOSIS — Z992 Dependence on renal dialysis: Secondary | ICD-10-CM | POA: Diagnosis not present

## 2021-09-12 DIAGNOSIS — N2581 Secondary hyperparathyroidism of renal origin: Secondary | ICD-10-CM | POA: Diagnosis not present

## 2021-09-12 DIAGNOSIS — Z992 Dependence on renal dialysis: Secondary | ICD-10-CM | POA: Diagnosis not present

## 2021-09-12 DIAGNOSIS — N186 End stage renal disease: Secondary | ICD-10-CM | POA: Diagnosis not present

## 2021-09-14 DIAGNOSIS — N2581 Secondary hyperparathyroidism of renal origin: Secondary | ICD-10-CM | POA: Diagnosis not present

## 2021-09-14 DIAGNOSIS — Z992 Dependence on renal dialysis: Secondary | ICD-10-CM | POA: Diagnosis not present

## 2021-09-14 DIAGNOSIS — N186 End stage renal disease: Secondary | ICD-10-CM | POA: Diagnosis not present

## 2021-09-16 DIAGNOSIS — N186 End stage renal disease: Secondary | ICD-10-CM | POA: Diagnosis not present

## 2021-09-16 DIAGNOSIS — N2581 Secondary hyperparathyroidism of renal origin: Secondary | ICD-10-CM | POA: Diagnosis not present

## 2021-09-16 DIAGNOSIS — Z992 Dependence on renal dialysis: Secondary | ICD-10-CM | POA: Diagnosis not present

## 2021-09-18 ENCOUNTER — Other Ambulatory Visit: Payer: Self-pay | Admitting: Endocrinology

## 2021-09-19 DIAGNOSIS — N2581 Secondary hyperparathyroidism of renal origin: Secondary | ICD-10-CM | POA: Diagnosis not present

## 2021-09-19 DIAGNOSIS — Z992 Dependence on renal dialysis: Secondary | ICD-10-CM | POA: Diagnosis not present

## 2021-09-19 DIAGNOSIS — N186 End stage renal disease: Secondary | ICD-10-CM | POA: Diagnosis not present

## 2021-09-21 DIAGNOSIS — Z992 Dependence on renal dialysis: Secondary | ICD-10-CM | POA: Diagnosis not present

## 2021-09-21 DIAGNOSIS — N2581 Secondary hyperparathyroidism of renal origin: Secondary | ICD-10-CM | POA: Diagnosis not present

## 2021-09-21 DIAGNOSIS — N186 End stage renal disease: Secondary | ICD-10-CM | POA: Diagnosis not present

## 2021-09-23 DIAGNOSIS — Z992 Dependence on renal dialysis: Secondary | ICD-10-CM | POA: Diagnosis not present

## 2021-09-23 DIAGNOSIS — N2581 Secondary hyperparathyroidism of renal origin: Secondary | ICD-10-CM | POA: Diagnosis not present

## 2021-09-23 DIAGNOSIS — N186 End stage renal disease: Secondary | ICD-10-CM | POA: Diagnosis not present

## 2021-09-25 DIAGNOSIS — N186 End stage renal disease: Secondary | ICD-10-CM | POA: Diagnosis not present

## 2021-09-25 DIAGNOSIS — E1151 Type 2 diabetes mellitus with diabetic peripheral angiopathy without gangrene: Secondary | ICD-10-CM | POA: Diagnosis not present

## 2021-09-25 DIAGNOSIS — I10 Essential (primary) hypertension: Secondary | ICD-10-CM | POA: Diagnosis not present

## 2021-09-25 DIAGNOSIS — E785 Hyperlipidemia, unspecified: Secondary | ICD-10-CM | POA: Diagnosis not present

## 2021-09-25 DIAGNOSIS — E1165 Type 2 diabetes mellitus with hyperglycemia: Secondary | ICD-10-CM | POA: Diagnosis not present

## 2021-09-25 DIAGNOSIS — H543 Unqualified visual loss, both eyes: Secondary | ICD-10-CM | POA: Diagnosis not present

## 2021-09-25 DIAGNOSIS — Z23 Encounter for immunization: Secondary | ICD-10-CM | POA: Diagnosis not present

## 2021-09-25 DIAGNOSIS — Z0000 Encounter for general adult medical examination without abnormal findings: Secondary | ICD-10-CM | POA: Diagnosis not present

## 2021-09-26 DIAGNOSIS — N2581 Secondary hyperparathyroidism of renal origin: Secondary | ICD-10-CM | POA: Diagnosis not present

## 2021-09-26 DIAGNOSIS — N186 End stage renal disease: Secondary | ICD-10-CM | POA: Diagnosis not present

## 2021-09-26 DIAGNOSIS — Z992 Dependence on renal dialysis: Secondary | ICD-10-CM | POA: Diagnosis not present

## 2021-09-28 DIAGNOSIS — Z992 Dependence on renal dialysis: Secondary | ICD-10-CM | POA: Diagnosis not present

## 2021-09-28 DIAGNOSIS — N2581 Secondary hyperparathyroidism of renal origin: Secondary | ICD-10-CM | POA: Diagnosis not present

## 2021-09-28 DIAGNOSIS — E1122 Type 2 diabetes mellitus with diabetic chronic kidney disease: Secondary | ICD-10-CM | POA: Diagnosis not present

## 2021-09-28 DIAGNOSIS — N186 End stage renal disease: Secondary | ICD-10-CM | POA: Diagnosis not present

## 2021-09-30 DIAGNOSIS — Z992 Dependence on renal dialysis: Secondary | ICD-10-CM | POA: Diagnosis not present

## 2021-09-30 DIAGNOSIS — N186 End stage renal disease: Secondary | ICD-10-CM | POA: Diagnosis not present

## 2021-09-30 DIAGNOSIS — N2581 Secondary hyperparathyroidism of renal origin: Secondary | ICD-10-CM | POA: Diagnosis not present

## 2021-10-03 DIAGNOSIS — N186 End stage renal disease: Secondary | ICD-10-CM | POA: Diagnosis not present

## 2021-10-03 DIAGNOSIS — N2581 Secondary hyperparathyroidism of renal origin: Secondary | ICD-10-CM | POA: Diagnosis not present

## 2021-10-03 DIAGNOSIS — Z992 Dependence on renal dialysis: Secondary | ICD-10-CM | POA: Diagnosis not present

## 2021-10-07 DIAGNOSIS — N2581 Secondary hyperparathyroidism of renal origin: Secondary | ICD-10-CM | POA: Diagnosis not present

## 2021-10-07 DIAGNOSIS — N186 End stage renal disease: Secondary | ICD-10-CM | POA: Diagnosis not present

## 2021-10-07 DIAGNOSIS — Z992 Dependence on renal dialysis: Secondary | ICD-10-CM | POA: Diagnosis not present

## 2021-10-10 DIAGNOSIS — N2581 Secondary hyperparathyroidism of renal origin: Secondary | ICD-10-CM | POA: Diagnosis not present

## 2021-10-10 DIAGNOSIS — Z992 Dependence on renal dialysis: Secondary | ICD-10-CM | POA: Diagnosis not present

## 2021-10-10 DIAGNOSIS — N186 End stage renal disease: Secondary | ICD-10-CM | POA: Diagnosis not present

## 2021-10-12 DIAGNOSIS — Z992 Dependence on renal dialysis: Secondary | ICD-10-CM | POA: Diagnosis not present

## 2021-10-12 DIAGNOSIS — N186 End stage renal disease: Secondary | ICD-10-CM | POA: Diagnosis not present

## 2021-10-12 DIAGNOSIS — N2581 Secondary hyperparathyroidism of renal origin: Secondary | ICD-10-CM | POA: Diagnosis not present

## 2021-10-14 DIAGNOSIS — Z992 Dependence on renal dialysis: Secondary | ICD-10-CM | POA: Diagnosis not present

## 2021-10-14 DIAGNOSIS — N2581 Secondary hyperparathyroidism of renal origin: Secondary | ICD-10-CM | POA: Diagnosis not present

## 2021-10-14 DIAGNOSIS — N186 End stage renal disease: Secondary | ICD-10-CM | POA: Diagnosis not present

## 2021-10-17 DIAGNOSIS — Z992 Dependence on renal dialysis: Secondary | ICD-10-CM | POA: Diagnosis not present

## 2021-10-17 DIAGNOSIS — N186 End stage renal disease: Secondary | ICD-10-CM | POA: Diagnosis not present

## 2021-10-17 DIAGNOSIS — N2581 Secondary hyperparathyroidism of renal origin: Secondary | ICD-10-CM | POA: Diagnosis not present

## 2021-10-19 DIAGNOSIS — N186 End stage renal disease: Secondary | ICD-10-CM | POA: Diagnosis not present

## 2021-10-19 DIAGNOSIS — N2581 Secondary hyperparathyroidism of renal origin: Secondary | ICD-10-CM | POA: Diagnosis not present

## 2021-10-19 DIAGNOSIS — Z992 Dependence on renal dialysis: Secondary | ICD-10-CM | POA: Diagnosis not present

## 2021-10-21 DIAGNOSIS — N2581 Secondary hyperparathyroidism of renal origin: Secondary | ICD-10-CM | POA: Diagnosis not present

## 2021-10-21 DIAGNOSIS — N186 End stage renal disease: Secondary | ICD-10-CM | POA: Diagnosis not present

## 2021-10-21 DIAGNOSIS — Z992 Dependence on renal dialysis: Secondary | ICD-10-CM | POA: Diagnosis not present

## 2021-10-24 DIAGNOSIS — N2581 Secondary hyperparathyroidism of renal origin: Secondary | ICD-10-CM | POA: Diagnosis not present

## 2021-10-24 DIAGNOSIS — Z992 Dependence on renal dialysis: Secondary | ICD-10-CM | POA: Diagnosis not present

## 2021-10-24 DIAGNOSIS — N186 End stage renal disease: Secondary | ICD-10-CM | POA: Diagnosis not present

## 2021-10-25 ENCOUNTER — Ambulatory Visit (INDEPENDENT_AMBULATORY_CARE_PROVIDER_SITE_OTHER): Payer: Medicare PPO | Admitting: Podiatry

## 2021-10-25 DIAGNOSIS — M2042 Other hammer toe(s) (acquired), left foot: Secondary | ICD-10-CM | POA: Diagnosis not present

## 2021-10-25 DIAGNOSIS — E1151 Type 2 diabetes mellitus with diabetic peripheral angiopathy without gangrene: Secondary | ICD-10-CM | POA: Diagnosis not present

## 2021-10-25 DIAGNOSIS — M79675 Pain in left toe(s): Secondary | ICD-10-CM

## 2021-10-25 DIAGNOSIS — M2041 Other hammer toe(s) (acquired), right foot: Secondary | ICD-10-CM | POA: Diagnosis not present

## 2021-10-25 DIAGNOSIS — M79674 Pain in right toe(s): Secondary | ICD-10-CM | POA: Diagnosis not present

## 2021-10-25 DIAGNOSIS — B351 Tinea unguium: Secondary | ICD-10-CM | POA: Diagnosis not present

## 2021-10-25 NOTE — Progress Notes (Signed)
  Subjective:  Patient ID: Julie Brewer, female    DOB: 1951-01-10,  MRN: [id number]  Chief Complaint  Patient presents with   Diabetes    Nail trim    71 y.o. female returns for the above complaint.  Patient is a type II diabetic whose A1c is 6.1.  Patient presents with thickened elongated mycotic toenails x10.  Patient states that she is not able to cut it herself.  She would like to bring [place] to debride them down.  She also has secondary complaint of obtain diabetic shoes.  She states is been greater than a year she would like to obtain a proceeding another pair of diabetic shoes.  Objective:  There were no vitals filed for this visit. Podiatric Exam: Vascular: dorsalis pedis and posterior tibial pulses are palpable bilateral. Capillary return is immediate. Temperature gradient is WNL. Skin turgor WNL  Sensorium: Normal Semmes Weinstein monofilament test. Normal tactile sensation bilaterally. Nail Exam: Pt has thick disfigured discolored nails with subungual debris noted bilateral entire nail hallux through fifth toenails Ulcer Exam: There is no evidence of ulcer or pre-ulcerative changes or infection. Orthopedic Exam: Muscle tone and strength are WNL. No limitations in general ROM. No crepitus or effusions noted. HAV  B/L.  Hammer toes 2-5  B/L. Skin: No Porokeratosis. No infection or ulcers.  Submet 3 hyperkeratotic lesion/callus x2.  Pain on palpation.  Assessment & Plan:  Patient was evaluated and treated and all questions answered.  Hammertoe contractures bilateral two through five -I explained to patient the etiology of hammertoe contracture as well as treatment options were discussed. Given that patient has underlying diabetes with contractures of the hammertoes I believe patient will benefit from diabetic shoes to prevent ulceration leading to amputation. Patient agrees with the plan. -Patient will be scheduled to get another pair of diabetic shoes made.  Onychomycosis  with pain  -Nails palliatively debrided as below. -Educated on self-care  Procedure: Nail Debridement Rationale: pain  Type of Debridement: manual, sharp debridement. Instrumentation: Nail nipper, rotary burr. Number of Nails: 10  Procedures and Treatment: Consent by patient was obtained for treatment procedures. The patient understood the discussion of treatment and procedures well. All questions were answered thoroughly reviewed. Debridement of mycotic and hypertrophic toenails, 1 through 5 bilateral and clearing of subungual debris. No ulceration, no infection noted.  Return Visit-Office Procedure: Patient instructed to return to the office for a follow up visit 3 months for continued evaluation and treatment.  Boneta Lucks, DPM    No follow-ups on file.  Toenails x10.  Hammertoe bilateral diabetic shoes

## 2021-10-26 DIAGNOSIS — N186 End stage renal disease: Secondary | ICD-10-CM | POA: Diagnosis not present

## 2021-10-26 DIAGNOSIS — Z992 Dependence on renal dialysis: Secondary | ICD-10-CM | POA: Diagnosis not present

## 2021-10-26 DIAGNOSIS — N2581 Secondary hyperparathyroidism of renal origin: Secondary | ICD-10-CM | POA: Diagnosis not present

## 2021-10-28 DIAGNOSIS — N2581 Secondary hyperparathyroidism of renal origin: Secondary | ICD-10-CM | POA: Diagnosis not present

## 2021-10-28 DIAGNOSIS — E1122 Type 2 diabetes mellitus with diabetic chronic kidney disease: Secondary | ICD-10-CM | POA: Diagnosis not present

## 2021-10-28 DIAGNOSIS — N186 End stage renal disease: Secondary | ICD-10-CM | POA: Diagnosis not present

## 2021-10-28 DIAGNOSIS — Z992 Dependence on renal dialysis: Secondary | ICD-10-CM | POA: Diagnosis not present

## 2021-10-31 DIAGNOSIS — N2581 Secondary hyperparathyroidism of renal origin: Secondary | ICD-10-CM | POA: Diagnosis not present

## 2021-10-31 DIAGNOSIS — Z992 Dependence on renal dialysis: Secondary | ICD-10-CM | POA: Diagnosis not present

## 2021-10-31 DIAGNOSIS — N186 End stage renal disease: Secondary | ICD-10-CM | POA: Diagnosis not present

## 2021-11-02 DIAGNOSIS — N2581 Secondary hyperparathyroidism of renal origin: Secondary | ICD-10-CM | POA: Diagnosis not present

## 2021-11-02 DIAGNOSIS — Z992 Dependence on renal dialysis: Secondary | ICD-10-CM | POA: Diagnosis not present

## 2021-11-02 DIAGNOSIS — N186 End stage renal disease: Secondary | ICD-10-CM | POA: Diagnosis not present

## 2021-11-04 DIAGNOSIS — Z992 Dependence on renal dialysis: Secondary | ICD-10-CM | POA: Diagnosis not present

## 2021-11-04 DIAGNOSIS — N2581 Secondary hyperparathyroidism of renal origin: Secondary | ICD-10-CM | POA: Diagnosis not present

## 2021-11-04 DIAGNOSIS — N186 End stage renal disease: Secondary | ICD-10-CM | POA: Diagnosis not present

## 2021-11-06 DIAGNOSIS — E1165 Type 2 diabetes mellitus with hyperglycemia: Secondary | ICD-10-CM | POA: Diagnosis not present

## 2021-11-07 DIAGNOSIS — N186 End stage renal disease: Secondary | ICD-10-CM | POA: Diagnosis not present

## 2021-11-07 DIAGNOSIS — N2581 Secondary hyperparathyroidism of renal origin: Secondary | ICD-10-CM | POA: Diagnosis not present

## 2021-11-07 DIAGNOSIS — Z992 Dependence on renal dialysis: Secondary | ICD-10-CM | POA: Diagnosis not present

## 2021-11-09 DIAGNOSIS — N2581 Secondary hyperparathyroidism of renal origin: Secondary | ICD-10-CM | POA: Diagnosis not present

## 2021-11-09 DIAGNOSIS — Z992 Dependence on renal dialysis: Secondary | ICD-10-CM | POA: Diagnosis not present

## 2021-11-09 DIAGNOSIS — N186 End stage renal disease: Secondary | ICD-10-CM | POA: Diagnosis not present

## 2021-11-11 DIAGNOSIS — N186 End stage renal disease: Secondary | ICD-10-CM | POA: Diagnosis not present

## 2021-11-11 DIAGNOSIS — N2581 Secondary hyperparathyroidism of renal origin: Secondary | ICD-10-CM | POA: Diagnosis not present

## 2021-11-11 DIAGNOSIS — Z992 Dependence on renal dialysis: Secondary | ICD-10-CM | POA: Diagnosis not present

## 2021-11-13 ENCOUNTER — Other Ambulatory Visit: Payer: Self-pay | Admitting: Family Medicine

## 2021-11-13 ENCOUNTER — Other Ambulatory Visit: Payer: Self-pay | Admitting: Vascular Surgery

## 2021-11-13 DIAGNOSIS — Z1231 Encounter for screening mammogram for malignant neoplasm of breast: Secondary | ICD-10-CM

## 2021-11-14 DIAGNOSIS — N2581 Secondary hyperparathyroidism of renal origin: Secondary | ICD-10-CM | POA: Diagnosis not present

## 2021-11-14 DIAGNOSIS — Z992 Dependence on renal dialysis: Secondary | ICD-10-CM | POA: Diagnosis not present

## 2021-11-14 DIAGNOSIS — N186 End stage renal disease: Secondary | ICD-10-CM | POA: Diagnosis not present

## 2021-11-15 DIAGNOSIS — T82858A Stenosis of vascular prosthetic devices, implants and grafts, initial encounter: Secondary | ICD-10-CM | POA: Diagnosis not present

## 2021-11-15 DIAGNOSIS — I871 Compression of vein: Secondary | ICD-10-CM | POA: Diagnosis not present

## 2021-11-15 DIAGNOSIS — Z992 Dependence on renal dialysis: Secondary | ICD-10-CM | POA: Diagnosis not present

## 2021-11-15 DIAGNOSIS — N186 End stage renal disease: Secondary | ICD-10-CM | POA: Diagnosis not present

## 2021-11-16 ENCOUNTER — Other Ambulatory Visit: Payer: Self-pay | Admitting: *Deleted

## 2021-11-16 DIAGNOSIS — I70212 Atherosclerosis of native arteries of extremities with intermittent claudication, left leg: Secondary | ICD-10-CM

## 2021-11-16 DIAGNOSIS — N186 End stage renal disease: Secondary | ICD-10-CM | POA: Diagnosis not present

## 2021-11-16 DIAGNOSIS — N2581 Secondary hyperparathyroidism of renal origin: Secondary | ICD-10-CM | POA: Diagnosis not present

## 2021-11-16 DIAGNOSIS — I779 Disorder of arteries and arterioles, unspecified: Secondary | ICD-10-CM

## 2021-11-16 DIAGNOSIS — Z992 Dependence on renal dialysis: Secondary | ICD-10-CM | POA: Diagnosis not present

## 2021-11-18 DIAGNOSIS — N2581 Secondary hyperparathyroidism of renal origin: Secondary | ICD-10-CM | POA: Diagnosis not present

## 2021-11-18 DIAGNOSIS — N186 End stage renal disease: Secondary | ICD-10-CM | POA: Diagnosis not present

## 2021-11-18 DIAGNOSIS — Z992 Dependence on renal dialysis: Secondary | ICD-10-CM | POA: Diagnosis not present

## 2021-11-21 DIAGNOSIS — N2581 Secondary hyperparathyroidism of renal origin: Secondary | ICD-10-CM | POA: Diagnosis not present

## 2021-11-21 DIAGNOSIS — N186 End stage renal disease: Secondary | ICD-10-CM | POA: Diagnosis not present

## 2021-11-21 DIAGNOSIS — Z992 Dependence on renal dialysis: Secondary | ICD-10-CM | POA: Diagnosis not present

## 2021-11-22 DIAGNOSIS — E1151 Type 2 diabetes mellitus with diabetic peripheral angiopathy without gangrene: Secondary | ICD-10-CM | POA: Diagnosis not present

## 2021-11-22 DIAGNOSIS — I10 Essential (primary) hypertension: Secondary | ICD-10-CM | POA: Diagnosis not present

## 2021-11-22 DIAGNOSIS — N186 End stage renal disease: Secondary | ICD-10-CM | POA: Diagnosis not present

## 2021-11-22 DIAGNOSIS — E785 Hyperlipidemia, unspecified: Secondary | ICD-10-CM | POA: Diagnosis not present

## 2021-11-23 DIAGNOSIS — Z992 Dependence on renal dialysis: Secondary | ICD-10-CM | POA: Diagnosis not present

## 2021-11-23 DIAGNOSIS — N2581 Secondary hyperparathyroidism of renal origin: Secondary | ICD-10-CM | POA: Diagnosis not present

## 2021-11-23 DIAGNOSIS — N186 End stage renal disease: Secondary | ICD-10-CM | POA: Diagnosis not present

## 2021-11-24 ENCOUNTER — Other Ambulatory Visit: Payer: Self-pay

## 2021-11-25 DIAGNOSIS — N186 End stage renal disease: Secondary | ICD-10-CM | POA: Diagnosis not present

## 2021-11-25 DIAGNOSIS — N2581 Secondary hyperparathyroidism of renal origin: Secondary | ICD-10-CM | POA: Diagnosis not present

## 2021-11-25 DIAGNOSIS — Z992 Dependence on renal dialysis: Secondary | ICD-10-CM | POA: Diagnosis not present

## 2021-11-27 ENCOUNTER — Other Ambulatory Visit: Payer: Self-pay

## 2021-11-28 DIAGNOSIS — E1122 Type 2 diabetes mellitus with diabetic chronic kidney disease: Secondary | ICD-10-CM | POA: Diagnosis not present

## 2021-11-28 DIAGNOSIS — Z992 Dependence on renal dialysis: Secondary | ICD-10-CM | POA: Diagnosis not present

## 2021-11-28 DIAGNOSIS — N2581 Secondary hyperparathyroidism of renal origin: Secondary | ICD-10-CM | POA: Diagnosis not present

## 2021-11-28 DIAGNOSIS — N186 End stage renal disease: Secondary | ICD-10-CM | POA: Diagnosis not present

## 2021-11-30 DIAGNOSIS — N2581 Secondary hyperparathyroidism of renal origin: Secondary | ICD-10-CM | POA: Diagnosis not present

## 2021-11-30 DIAGNOSIS — Z992 Dependence on renal dialysis: Secondary | ICD-10-CM | POA: Diagnosis not present

## 2021-11-30 DIAGNOSIS — N186 End stage renal disease: Secondary | ICD-10-CM | POA: Diagnosis not present

## 2021-12-02 DIAGNOSIS — N186 End stage renal disease: Secondary | ICD-10-CM | POA: Diagnosis not present

## 2021-12-02 DIAGNOSIS — Z992 Dependence on renal dialysis: Secondary | ICD-10-CM | POA: Diagnosis not present

## 2021-12-02 DIAGNOSIS — N2581 Secondary hyperparathyroidism of renal origin: Secondary | ICD-10-CM | POA: Diagnosis not present

## 2021-12-04 ENCOUNTER — Other Ambulatory Visit: Payer: PRIVATE HEALTH INSURANCE

## 2021-12-04 ENCOUNTER — Ambulatory Visit: Payer: PRIVATE HEALTH INSURANCE

## 2021-12-04 ENCOUNTER — Encounter (HOSPITAL_COMMUNITY): Payer: PRIVATE HEALTH INSURANCE

## 2021-12-05 DIAGNOSIS — Z992 Dependence on renal dialysis: Secondary | ICD-10-CM | POA: Diagnosis not present

## 2021-12-05 DIAGNOSIS — N2581 Secondary hyperparathyroidism of renal origin: Secondary | ICD-10-CM | POA: Diagnosis not present

## 2021-12-05 DIAGNOSIS — N186 End stage renal disease: Secondary | ICD-10-CM | POA: Diagnosis not present

## 2021-12-06 ENCOUNTER — Ambulatory Visit: Payer: PRIVATE HEALTH INSURANCE | Admitting: Endocrinology

## 2021-12-06 NOTE — Progress Notes (Signed)
[facility] HISTORY AND PHYSICAL    History of Present Illness:  Patient is a 71 y.o. year old female who presents for evaluation of claudication.  She was initially sen in 2011 with mild claudication symptoms.   She was treated with cilostazol with improvement.  She has had no prior lower extremity intervention.  She denise symptoms of rest pain, non healing ulcers and claudication symptoms.           She ambulates independently with a cain.  She is legally blind.  She is medically managed on Cilostazol, ASA and Liptior daily.                    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 and left eye some vision remains   CHF (congestive heart failure) (HCC)     Chronic kidney disease      Tu/Th/Sa   Diabetes mellitus      Type II per Dr [name]  (patient said type I)   Fibroid     GERD (gastroesophageal reflux disease)     Glaucoma     Hyperlipidemia     Hypertension     Iron deficiency anemia 03/09/2016   Peripheral vascular disease ([place])     Pneumonia 2006   PONV (postoperative nausea and vomiting)     Seizure disorder (HCC)     Shortness of breath dyspnea      with exdrtion, "Walkling too fast"   Stroke ([place])      no residual               Past Surgical History:  Procedure Laterality Date   ABDOMINAL HYSTERECTOMY       AV FISTULA PLACEMENT Left 04/27/2019    Procedure: INSERTION OF ARTERIOVENOUS (AV) GORE-TEX GRAFT LEFT UPPER ARM;  Surgeon: Angelia Mould, MD;  Location: [facility];  Service: Vascular;  Laterality: Left;   BIOPSY   06/10/2017    Procedure: BIOPSY;  Surgeon: Ronnette Juniper, MD;  Location: [facility];  Service: Gastroenterology;;   BIOPSY   04/10/2021    Procedure: BIOPSY;  Surgeon: Otis Brace, MD;  Location: [place];  Service: Gastroenterology;;   BREAST BIOPSY Right     CERVICAL FUSION        with graft from hip    COLONOSCOPY   July 09, 2012   DIRECT LARYNGOSCOPY N/A 06/07/2014    Procedure: DIRECT LARYNGOSCOPY with BIOPSY and EXCISION VOLLECULAR CYST;  Surgeon: Ruby Cola, MD;  Location: [facility];  Service: ENT;  Laterality: N/A;   ESOPHAGOGASTRODUODENOSCOPY (EGD) WITH PROPOFOL Left 06/10/2017    Procedure: ESOPHAGOGASTRODUODENOSCOPY (EGD) WITH PROPOFOL;  Surgeon: Ronnette Juniper, MD;  Location: [place];  Service: Gastroenterology;  Laterality: Left;   ESOPHAGOGASTRODUODENOSCOPY (EGD) WITH PROPOFOL N/A 04/10/2021    Procedure: ESOPHAGOGASTRODUODENOSCOPY (EGD) WITH PROPOFOL;  Surgeon: Otis Brace, MD;  Location: [facility];  Service: Gastroenterology;  Laterality: N/A;   FLEXIBLE SIGMOIDOSCOPY Left 06/10/2017    Procedure: FLEXIBLE SIGMOIDOSCOPY;  Surgeon: Ronnette Juniper, MD;  Location: [place];  Service: Gastroenterology;  Laterality: Left;   HOT HEMOSTASIS N/A 04/10/2021    Procedure: HOT HEMOSTASIS (ARGON PLASMA COAGULATION/BICAP);  Surgeon: Otis Brace, MD;  Location: [facility];  Service: Gastroenterology;  Laterality: N/A;   LOOP RECORDER INSERTION N/A 06/20/2016    Procedure: Loop Recorder Insertion;  Surgeon:  Croitoru, Mihai, MD;  Location: [facility];  Service: Cardiovascular;  Laterality: N/A;   REFRACTIVE SURGERY Bilateral      both eyes   SPINE SURGERY        lumbar      ROS:    General:  No weight loss, Fever, chills   HEENT: No recent headaches, no nasal bleeding, vision loss, no sore throat   Neurologic: No dizziness, blackouts, seizures. No recent symptoms of stroke or mini- stroke. No recent episodes of slurred speech, or temporary blindness.   Cardiac: No recent episodes of chest pain/pressure, no shortness of breath at rest.  No shortness of breath with exertion.  Denies history of atrial fibrillation or irregular heartbeat   Vascular: No history of rest pain in feet.  No history of claudication.  No history of non-healing ulcer, No history of DVT     Pulmonary: No home oxygen, no productive cough, no hemoptysis,  No asthma or wheezing   Musculoskeletal:  _0  Arthritis, _1  Low back pain,  _2  Joint pain   Hematologic:No history of hypercoagulable state.  No history of easy bleeding.  No history of anemia   Gastrointestinal: No hematochezia or melena,  No gastroesophageal reflux, no trouble swallowing   Urinary: _3  chronic Kidney disease, _4  on HD - _5  MWF or _6  TTHS, _7  Burning with urination, _8  Frequent urination, _9  Difficulty urinating;    Skin: No rashes   Psychological: No history of anxiety,  No history of depression   Social History Social History             Tobacco Use   Smoking status: Every Day      Packs/day: 1.00      Years: 30.00      Total pack years: 30.00      Types: Cigarettes   Smokeless tobacco: Never   Tobacco comments:      8- 10 cig a day  Vaping Use   Vaping Use: Never used  Substance Use Topics   Alcohol use: Not Currently      Comment: socially   Drug use: Not Currently      Types: Methylphenidate      Family History          Family History  Problem Relation Age of Onset   Cancer Mother     Heart disease Mother     Diabetes Father     Cancer Brother     Cancer Brother     Throat cancer Brother        Allergies            Allergies  Allergen Reactions   Morphine And Related Other (See Comments)      Hallucinations    Penicillins Swelling and Rash      Throat swelling Did it involve swelling of the face/tongue/throat, SOB, or low BP? Yes Did it involve sudden or severe rash/hives, skin peeling, or any reaction on the inside of your mouth or nose? Yes Did you need to seek medical attention at a hospital or doctor's office? Yes When did it last happen?   young child    If all above answers are "NO", may proceed with cephalosporin use.                   Current Outpatient Medications  Medication Sig Dispense Refill   acetaminophen (TYLENOL) 325 MG tablet Take 2  tablets (650  mg total) by mouth every 6 (six) hours as needed for mild pain (or Fever >/= 101).       amLODipine (NORVASC) 5 MG tablet Take 5 mg by mouth daily.       aspirin 325 MG EC tablet Take 1 tablet (325 mg total) by mouth daily.       atorvastatin (LIPITOR) 20 MG tablet Take 1 tablet by mouth once daily 90 tablet 0   bisacodyl (DULCOLAX) 5 MG EC tablet Take 5 mg by mouth daily as needed for moderate constipation.       Blood Glucose Monitoring Suppl (FREESTYLE FREEDOM) KIT Use to check blood sugar once a day dx code 1 kit 1   cilostazol (PLETAL) 100 MG tablet Take 1 tablet by mouth twice daily 180 tablet 0   Continuous Blood Gluc Receiver (DEXCOM G6 RECEIVER) DEVI Use to display glucose from sensor 1 each 0   Continuous Blood Gluc Sensor (DEXCOM G6 SENSOR) MISC Use to monitor blood sugar, change after 10 days 3 each 3   docusate sodium (COLACE) 100 MG capsule Take 200 mg by mouth daily as needed for mild constipation.       gabapentin (NEURONTIN) 100 MG capsule TAKE 1 CAPSULE BY MOUTH TWICE DAILY AS NEEDED (Patient taking differently: 100 mg 2 (two) times daily as needed (neuropathy).) 180 capsule 2   glucose blood (FREESTYLE LITE) test strip USE AS INSTRUCTED TO CHECK BLOOD SUGAR ONCE DAILY. 50 each 3   insulin glargine (LANTUS) 100 UNIT/ML injection Inject 0.12 mLs (12 Units total) into the skin daily. (Patient not taking: Reported on 08/16/2021)       Insulin Pen Needle 31G X 5 MM MISC Use with pen 100 each 1   insulin regular (NOVOLIN R) 100 units/mL injection Inject 7-10 Units into the skin See admin instructions.  12 units with breakfast and 12 units with dinner       lidocaine-prilocaine (EMLA) cream 1 application. daily as needed (port access).       pantoprazole (PROTONIX) 40 MG tablet Take 1 tablet (40 mg) by mouth twice daily for 4 weeks, then switch to 1 tablet once daily. (Patient taking differently: Take 40 mg by mouth daily.) 60 tablet 3   triamcinolone ointment (KENALOG) 0.1  % 1 application  daily.       Vitamin D, Ergocalciferol, (DRISDOL) 1.25 MG (50000 UNIT) CAPS capsule Take 1 capsule by mouth once a week (Patient taking differently: Provided at dialysis) 12 capsule 0    No current facility-administered medications for this visit.         Vitals:    12/11/21 1310  BP: 135/68  Pulse: 86  Resp: 14  Temp: 97.6 F (36.4 C)  SpO2: 99%      Physical Examination       General:  Alert and oriented, no acute distress HEENT: Normal, legally blind Neck: No bruit or JVD Pulmonary: Clear to auscultation bilaterally Cardiac: Regular Rate and Rhythm without murmur Abdomen: Soft, non-tender, non-distended, no mass, no scars Skin: No rash Extremity Pulses:  2+ radial, femoral, dorsalis pedis, pulses bilaterally Musculoskeletal: No deformity or edema      Neurologic: Upper and lower extremity motor 5/5 and symmetric   DATA:  ABI Findings:  +---------+------------------+-----+--------+--------+  Right   Rt Pressure (mmHg)IndexWaveformComment   +---------+------------------+-----+--------+--------+  Brachial 159                                      +---------+------------------+-----+--------+--------+  PTA     156               0.98 biphasic          +---------+------------------+-----+--------+--------+  DP      166               1.04 biphasic          +---------+------------------+-----+--------+--------+  Great Toe116               0.73                   +---------+------------------+-----+--------+--------+   +---------+------------------+-----+----------+-------+  Left    Lt Pressure (mmHg)IndexWaveform  Comment  +---------+------------------+-----+----------+-------+  Brachial                                  AVF      +---------+------------------+-----+----------+-------+  PTA     91                0.57 monophasic         +---------+------------------+-----+----------+-------+  DP      102                0.64 biphasic           +---------+------------------+-----+----------+-------+  Great Toe83                0.52                    +---------+------------------+-----+----------+-------+   +-------+-----------+-----------+------------+------------+  ABI/TBIToday's ABIToday's TBIPrevious ABIPrevious TBI  +-------+-----------+-----------+------------+------------+  Right 1.04       0.73       1.09        0.75          +-------+-----------+-----------+------------+------------+  Left  0.64       0.52       0.70        0.56          +-------+-----------+-----------+------------+------------+       Bilateral TBIs and ABIs appear essentially unchanged compared to prior  study on 11/28/2020.    Summary:  Right: Resting right ankle-brachial index is within normal range. The  right toe-brachial index is normal.   Left: Resting left ankle-brachial index indicates moderate left lower  extremity arterial disease. The left toe-brachial index is abnormal.        ASSESSMENT/PLAN:   Patient is a 71 y.o. year old female who presents for evaluation of claudication.  Her symptoms first started in 2011 with mild claudication.  She continues to take Cilostazol BID.         She has stable ABI's on the right with falsely elevated index > 1.0 due to calcified vessels.  The left LE shows moderate PAD with biphasic wave forms in the DP and monophasic PT.  The toe pressures are stable and she remains asymptomatic for claudication, rest pain or non healing wounds.        She will continue to stay active, have someone check her skin and take the Cilastozol BID.  As well as ASA and Statin.  F/U in 9 months for repeat ABI's and exam.  If she has problems or concerns she will call.          Roxy Horseman PA-C [facility] Office: [phone]   MD in the clinic [place]

## 2021-12-07 DIAGNOSIS — N2581 Secondary hyperparathyroidism of renal origin: Secondary | ICD-10-CM | POA: Diagnosis not present

## 2021-12-07 DIAGNOSIS — N186 End stage renal disease: Secondary | ICD-10-CM | POA: Diagnosis not present

## 2021-12-07 DIAGNOSIS — Z992 Dependence on renal dialysis: Secondary | ICD-10-CM | POA: Diagnosis not present

## 2021-12-09 DIAGNOSIS — N2581 Secondary hyperparathyroidism of renal origin: Secondary | ICD-10-CM | POA: Diagnosis not present

## 2021-12-09 DIAGNOSIS — Z992 Dependence on renal dialysis: Secondary | ICD-10-CM | POA: Diagnosis not present

## 2021-12-09 DIAGNOSIS — N186 End stage renal disease: Secondary | ICD-10-CM | POA: Diagnosis not present

## 2021-12-11 ENCOUNTER — Ambulatory Visit (HOSPITAL_COMMUNITY)
Admission: RE | Admit: 2021-12-11 | Discharge: 2021-12-11 | Disposition: A | Discharge status: Home / Self Care | Payer: Medicare PPO | Source: Ambulatory Visit | Attending: Physician Assistant | Admitting: Physician Assistant

## 2021-12-11 ENCOUNTER — Ambulatory Visit (INDEPENDENT_AMBULATORY_CARE_PROVIDER_SITE_OTHER): Payer: Medicare PPO | Admitting: Physician Assistant

## 2021-12-11 VITALS — BP 135/68 | HR 86 | Temp 97.6°F | Resp 14 | Ht 63.0 in | Wt 123.0 lb

## 2021-12-11 DIAGNOSIS — I70212 Atherosclerosis of native arteries of extremities with intermittent claudication, left leg: Secondary | ICD-10-CM

## 2021-12-11 DIAGNOSIS — I779 Disorder of arteries and arterioles, unspecified: Secondary | ICD-10-CM | POA: Insufficient documentation

## 2021-12-11 NOTE — Progress Notes (Deleted)
[facility] [place] HISTORY AND PHYSICAL    History of Present Illness:  Patient is a 71 y.o. year old female who presents for evaluation of claudication.  She was initially sen in 2011 with mild claudication symptoms.   She was treated with cilostazol with improvement.  She has had no prior lower extremity intervention.  She denise symptoms of rest pain, non healing ulcers and claudication symptoms.          She ambulates independently with a cain.  She is legally blind.  She is medically managed on Cilostazol, ASA and Liptior daily.                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 and left eye some vision remains   CHF (congestive heart failure) (HCC)     Chronic kidney disease      Tu/Th/Sa   Diabetes mellitus      Type II per Dr [name]  (patient said type I)   Fibroid     GERD (gastroesophageal reflux disease)     Glaucoma     Hyperlipidemia     Hypertension     Iron deficiency anemia 03/09/2016   Peripheral vascular disease ([place])     Pneumonia 2006   PONV (postoperative nausea and vomiting)     Seizure disorder (HCC)     Shortness of breath dyspnea      with exdrtion, "Walkling too fast"   Stroke ([place])      no residual           Past Surgical History:  Procedure Laterality Date   ABDOMINAL HYSTERECTOMY       AV FISTULA PLACEMENT Left 04/27/2019    Procedure: INSERTION OF ARTERIOVENOUS (AV) GORE-TEX GRAFT LEFT UPPER ARM;  Surgeon: Angelia Mould, MD;  Location: [place];  Service: Vascular;  Laterality: Left;   BIOPSY   06/10/2017    Procedure: BIOPSY;  Surgeon: Ronnette Juniper, MD;  Location: [place];  Service: Gastroenterology;;   BIOPSY   04/10/2021    Procedure: BIOPSY;  Surgeon: Otis Brace, MD;  Location: [place];  Service: Gastroenterology;;   BREAST BIOPSY Right     CERVICAL FUSION        with graft from hip   COLONOSCOPY    July 09, 2012   DIRECT LARYNGOSCOPY N/A 06/07/2014    Procedure: DIRECT LARYNGOSCOPY with BIOPSY and EXCISION VOLLECULAR CYST;  Surgeon: Ruby Cola, MD;  Location: [place];  Service: ENT;  Laterality: N/A;   ESOPHAGOGASTRODUODENOSCOPY (EGD) WITH PROPOFOL Left 06/10/2017    Procedure: ESOPHAGOGASTRODUODENOSCOPY (EGD) WITH PROPOFOL;  Surgeon: Ronnette Juniper, MD;  Location: [place];  Service: Gastroenterology;  Laterality: Left;   ESOPHAGOGASTRODUODENOSCOPY (EGD) WITH PROPOFOL N/A 04/10/2021    Procedure: ESOPHAGOGASTRODUODENOSCOPY (EGD) WITH PROPOFOL;  Surgeon: Otis Brace, MD;  Location: [place];  Service: Gastroenterology;  Laterality: N/A;   FLEXIBLE SIGMOIDOSCOPY Left 06/10/2017    Procedure: FLEXIBLE SIGMOIDOSCOPY;  Surgeon: Ronnette Juniper, MD;  Location: [place];  Service: Gastroenterology;  Laterality: Left;   HOT HEMOSTASIS N/A 04/10/2021    Procedure: HOT HEMOSTASIS (ARGON PLASMA COAGULATION/BICAP);  Surgeon: Otis Brace, MD;  Location: [facility] ENDOSCOPY;  Service: Gastroenterology;  Laterality: N/A;   LOOP RECORDER INSERTION N/A 06/20/2016    Procedure: Loop Recorder Insertion;  Surgeon: [name], MD;  Location: [place] [facility];  Service: Cardiovascular;  Laterality: N/A;   REFRACTIVE SURGERY Bilateral      both eyes   SPINE SURGERY        lumbar      ROS:    General:  No weight loss, Fever, chills   HEENT: No recent headaches, no nasal bleeding, vision loss, no sore throat   Neurologic: No dizziness, blackouts, seizures. No recent symptoms of stroke or mini- stroke. No recent episodes of slurred speech, or temporary blindness.   Cardiac: No recent episodes of chest pain/pressure, no shortness of breath at rest.  No shortness of breath with exertion.  Denies history of atrial fibrillation or irregular heartbeat   Vascular: No history of rest pain in feet.  No history of claudication.  No history of non-healing ulcer, No history of DVT    Pulmonary: No home  oxygen, no productive cough, no hemoptysis,  No asthma or wheezing   Musculoskeletal:  _0  Arthritis, _1  Low back pain,  _2  Joint pain   Hematologic:No history of hypercoagulable state.  No history of easy bleeding.  No history of anemia   Gastrointestinal: No hematochezia or melena,  No gastroesophageal reflux, no trouble swallowing   Urinary: _3  chronic Kidney disease, _4  on HD - _5  MWF or _6  TTHS, _7  Burning with urination, _8  Frequent urination, _9  Difficulty urinating;    Skin: No rashes   Psychological: No history of anxiety,  No history of depression   Social History Social History         Tobacco Use   Smoking status: Every Day      Packs/day: 1.00      Years: 30.00      Total pack years: 30.00      Types: Cigarettes   Smokeless tobacco: Never   Tobacco comments:      8- 10 cig a day  Vaping Use   Vaping Use: Never used  Substance Use Topics   Alcohol use: Not Currently      Comment: socially   Drug use: Not Currently      Types: Methylphenidate      Family History      Family History  Problem Relation Age of Onset   Cancer Mother     Heart disease Mother     Diabetes Father     Cancer Brother     Cancer Brother     Throat cancer Brother        Allergies        Allergies  Allergen Reactions   Morphine And Related Other (See Comments)      Hallucinations    Penicillins Swelling and Rash      Throat swelling Did it involve swelling of the face/tongue/throat, SOB, or low BP? Yes Did it involve sudden or severe rash/hives, skin peeling, or any reaction on the inside of your mouth or nose? Yes Did you need to seek medical attention at a hospital or doctor's office? Yes When did it last happen?   young child    If all above answers are "NO", may proceed with cephalosporin use.              Current Outpatient Medications  Medication Sig Dispense Refill   acetaminophen (TYLENOL) 325 MG tablet Take 2 tablets (650 mg total) by mouth every 6  (six) hours as needed for mild pain (or Fever >/= 101).       amLODipine (NORVASC) 5 MG  tablet Take 5 mg by mouth daily.       aspirin 325 MG EC tablet Take 1 tablet (325 mg total) by mouth daily.       atorvastatin (LIPITOR) 20 MG tablet Take 1 tablet by mouth once daily 90 tablet 0   bisacodyl (DULCOLAX) 5 MG EC tablet Take 5 mg by mouth daily as needed for moderate constipation.       Blood Glucose Monitoring Suppl (FREESTYLE FREEDOM) KIT Use to check blood sugar once a day dx code 1 kit 1   cilostazol (PLETAL) 100 MG tablet Take 1 tablet by mouth twice daily 180 tablet 0   Continuous Blood Gluc Receiver (DEXCOM G6 RECEIVER) DEVI Use to display glucose from sensor 1 each 0   Continuous Blood Gluc Sensor (DEXCOM G6 SENSOR) MISC Use to monitor blood sugar, change after 10 days 3 each 3   docusate sodium (COLACE) 100 MG capsule Take 200 mg by mouth daily as needed for mild constipation.       gabapentin (NEURONTIN) 100 MG capsule TAKE 1 CAPSULE BY MOUTH TWICE DAILY AS NEEDED (Patient taking differently: 100 mg 2 (two) times daily as needed (neuropathy).) 180 capsule 2   glucose blood (FREESTYLE LITE) test strip USE AS INSTRUCTED TO CHECK BLOOD SUGAR ONCE DAILY. 50 each 3   insulin glargine (LANTUS) 100 UNIT/ML injection Inject 0.12 mLs (12 Units total) into the skin daily. (Patient not taking: Reported on 08/16/2021)       Insulin Pen Needle 31G X 5 MM MISC Use with pen 100 each 1   insulin regular (NOVOLIN R) 100 units/mL injection Inject 7-10 Units into the skin See admin instructions.  12 units with breakfast and 12 units with dinner       lidocaine-prilocaine (EMLA) cream 1 application. daily as needed (port access).       pantoprazole (PROTONIX) 40 MG tablet Take 1 tablet (40 mg) by mouth twice daily for 4 weeks, then switch to 1 tablet once daily. (Patient taking differently: Take 40 mg by mouth daily.) 60 tablet 3   triamcinolone ointment (KENALOG) 0.1 % 1 application  daily.       Vitamin  D, Ergocalciferol, (DRISDOL) 1.25 MG (50000 UNIT) CAPS capsule Take 1 capsule by mouth once a week (Patient taking differently: Provided at dialysis) 12 capsule 0    No current facility-administered medications for this visit.      Vitals:   12/11/21 1310  BP: 135/68  Pulse: 86  Resp: 14  Temp: 97.6 F (36.4 C)  SpO2: 99%    Physical Examination      General:  Alert and oriented, no acute distress HEENT: Normal, legally blind Neck: No bruit or JVD Pulmonary: Clear to auscultation bilaterally Cardiac: Regular Rate and Rhythm without murmur Abdomen: Soft, non-tender, non-distended, no mass, no scars Skin: No rash Extremity Pulses:  2+ radial, femoral, dorsalis pedis, pulses bilaterally Musculoskeletal: No deformity or edema      Neurologic: Upper and lower extremity motor 5/5 and symmetric   DATA:  ABI Findings:  +---------+------------------+-----+--------+--------+  Right   Rt Pressure (mmHg)IndexWaveformComment   +---------+------------------+-----+--------+--------+  Brachial 159                                      +---------+------------------+-----+--------+--------+  PTA     156               0.98 biphasic          +---------+------------------+-----+--------+--------+  DP      166               1.04 biphasic          +---------+------------------+-----+--------+--------+  Great Toe116               0.73                   +---------+------------------+-----+--------+--------+   +---------+------------------+-----+----------+-------+  Left    Lt Pressure (mmHg)IndexWaveform  Comment  +---------+------------------+-----+----------+-------+  Brachial                                  AVF      +---------+------------------+-----+----------+-------+  PTA     91                0.57 monophasic         +---------+------------------+-----+----------+-------+  DP      102               0.64 biphasic            +---------+------------------+-----+----------+-------+  Great Toe83                0.52                    +---------+------------------+-----+----------+-------+   +-------+-----------+-----------+------------+------------+  ABI/TBIToday's ABIToday's TBIPrevious ABIPrevious TBI  +-------+-----------+-----------+------------+------------+  Right 1.04       0.73       1.09        0.75          +-------+-----------+-----------+------------+------------+  Left  0.64       0.52       0.70        0.56          +-------+-----------+-----------+------------+------------+       Bilateral TBIs and ABIs appear essentially unchanged compared to prior  study on 11/28/2020.    Summary:  Right: Resting right ankle-brachial index is within normal range. The  right toe-brachial index is normal.   Left: Resting left ankle-brachial index indicates moderate left lower  extremity arterial disease. The left toe-brachial index is abnormal.        ASSESSMENT/PLAN:   Patient is a 71 y.o. year old female who presents for evaluation of claudication.  Her symptoms first started in 2011 with mild claudication.  She continues to take Cilostazol BID.         She has stable ABI's on the right with falsely elevated index > 1.0 due to calcified vessels.  The left LE shows moderate PAD with biphasic wave forms in the DP and monophasic PT.  The toe pressures are stable and she remains asymptomatic for claudication, rest pain or non healing wounds.        She will continue to stay active, have someone check her skin and take the Cilastozol BID.  As well as ASA and Statin.  F/U in 9 months for repeat ABI's and exam.  If she has problems or concerns she will call.          Roxy Horseman PA-C [facility] [place] Office: [phone]   MD in the clinic [place]

## 2021-12-12 DIAGNOSIS — N2581 Secondary hyperparathyroidism of renal origin: Secondary | ICD-10-CM | POA: Diagnosis not present

## 2021-12-12 DIAGNOSIS — N186 End stage renal disease: Secondary | ICD-10-CM | POA: Diagnosis not present

## 2021-12-12 DIAGNOSIS — Z992 Dependence on renal dialysis: Secondary | ICD-10-CM | POA: Diagnosis not present

## 2021-12-14 DIAGNOSIS — N2581 Secondary hyperparathyroidism of renal origin: Secondary | ICD-10-CM | POA: Diagnosis not present

## 2021-12-14 DIAGNOSIS — N186 End stage renal disease: Secondary | ICD-10-CM | POA: Diagnosis not present

## 2021-12-14 DIAGNOSIS — Z992 Dependence on renal dialysis: Secondary | ICD-10-CM | POA: Diagnosis not present

## 2021-12-15 ENCOUNTER — Other Ambulatory Visit: Payer: Self-pay

## 2021-12-15 DIAGNOSIS — E1165 Type 2 diabetes mellitus with hyperglycemia: Secondary | ICD-10-CM | POA: Diagnosis not present

## 2021-12-15 DIAGNOSIS — I779 Disorder of arteries and arterioles, unspecified: Secondary | ICD-10-CM

## 2021-12-16 DIAGNOSIS — Z992 Dependence on renal dialysis: Secondary | ICD-10-CM | POA: Diagnosis not present

## 2021-12-16 DIAGNOSIS — N186 End stage renal disease: Secondary | ICD-10-CM | POA: Diagnosis not present

## 2021-12-16 DIAGNOSIS — N2581 Secondary hyperparathyroidism of renal origin: Secondary | ICD-10-CM | POA: Diagnosis not present

## 2021-12-18 ENCOUNTER — Other Ambulatory Visit (INDEPENDENT_AMBULATORY_CARE_PROVIDER_SITE_OTHER): Payer: Medicare PPO

## 2021-12-18 DIAGNOSIS — N186 End stage renal disease: Secondary | ICD-10-CM | POA: Diagnosis not present

## 2021-12-18 DIAGNOSIS — Z794 Long term (current) use of insulin: Secondary | ICD-10-CM

## 2021-12-18 DIAGNOSIS — Z992 Dependence on renal dialysis: Secondary | ICD-10-CM | POA: Diagnosis not present

## 2021-12-18 DIAGNOSIS — N2581 Secondary hyperparathyroidism of renal origin: Secondary | ICD-10-CM | POA: Diagnosis not present

## 2021-12-18 DIAGNOSIS — E1165 Type 2 diabetes mellitus with hyperglycemia: Secondary | ICD-10-CM | POA: Diagnosis not present

## 2021-12-18 LAB — HEMOGLOBIN A1C: Hgb A1c MFr Bld: 5.7 % (ref 4.6–6.5)

## 2021-12-18 LAB — GLUCOSE, RANDOM: Glucose, Bld: 139 mg/dL — ABNORMAL HIGH (ref 70–99)

## 2021-12-19 LAB — FRUCTOSAMINE: Fructosamine: 304 umol/L — ABNORMAL HIGH (ref 0–285)

## 2021-12-20 ENCOUNTER — Other Ambulatory Visit: Payer: PRIVATE HEALTH INSURANCE

## 2021-12-20 DIAGNOSIS — N2581 Secondary hyperparathyroidism of renal origin: Secondary | ICD-10-CM | POA: Diagnosis not present

## 2021-12-20 DIAGNOSIS — N186 End stage renal disease: Secondary | ICD-10-CM | POA: Diagnosis not present

## 2021-12-20 DIAGNOSIS — Z992 Dependence on renal dialysis: Secondary | ICD-10-CM | POA: Diagnosis not present

## 2021-12-22 ENCOUNTER — Other Ambulatory Visit: Payer: PRIVATE HEALTH INSURANCE

## 2021-12-23 ENCOUNTER — Other Ambulatory Visit: Payer: Self-pay | Admitting: Endocrinology

## 2021-12-23 DIAGNOSIS — N2581 Secondary hyperparathyroidism of renal origin: Secondary | ICD-10-CM | POA: Diagnosis not present

## 2021-12-23 DIAGNOSIS — N186 End stage renal disease: Secondary | ICD-10-CM | POA: Diagnosis not present

## 2021-12-23 DIAGNOSIS — Z992 Dependence on renal dialysis: Secondary | ICD-10-CM | POA: Diagnosis not present

## 2021-12-26 DIAGNOSIS — Z992 Dependence on renal dialysis: Secondary | ICD-10-CM | POA: Diagnosis not present

## 2021-12-26 DIAGNOSIS — N186 End stage renal disease: Secondary | ICD-10-CM | POA: Diagnosis not present

## 2021-12-26 DIAGNOSIS — N2581 Secondary hyperparathyroidism of renal origin: Secondary | ICD-10-CM | POA: Diagnosis not present

## 2021-12-27 ENCOUNTER — Ambulatory Visit
Admission: RE | Admit: 2021-12-27 | Discharge: 2021-12-27 | Disposition: A | Discharge status: Home / Self Care | Payer: PRIVATE HEALTH INSURANCE | Source: Ambulatory Visit | Attending: Family Medicine | Admitting: Family Medicine

## 2021-12-27 ENCOUNTER — Ambulatory Visit (INDEPENDENT_AMBULATORY_CARE_PROVIDER_SITE_OTHER): Payer: Medicare PPO | Admitting: Endocrinology

## 2021-12-27 ENCOUNTER — Encounter: Payer: Self-pay | Admitting: Endocrinology

## 2021-12-27 VITALS — BP 128/60 | HR 92 | Ht 63.0 in | Wt 126.4 lb

## 2021-12-27 DIAGNOSIS — E782 Mixed hyperlipidemia: Secondary | ICD-10-CM | POA: Diagnosis not present

## 2021-12-27 DIAGNOSIS — Z1231 Encounter for screening mammogram for malignant neoplasm of breast: Secondary | ICD-10-CM

## 2021-12-27 DIAGNOSIS — E1165 Type 2 diabetes mellitus with hyperglycemia: Secondary | ICD-10-CM | POA: Diagnosis not present

## 2021-12-27 DIAGNOSIS — Z794 Long term (current) use of insulin: Secondary | ICD-10-CM | POA: Diagnosis not present

## 2021-12-27 MED ORDER — DEXCOM G7 RECEIVER DEVI: 0 refills | Status: DC

## 2021-12-27 MED ORDER — DEXCOM G7 SENSOR MISC: 1.0000 | 3 refills | Status: DC

## 2021-12-27 MED ORDER — INSULIN ASPART 100 UNIT/ML IJ SOLN: INTRAMUSCULAR | 2 refills | Status: DC

## 2021-12-27 NOTE — Patient Instructions (Signed)
Change insulin to Novolog 12 units in am and 14 at supper  15 min before meals

## 2021-12-27 NOTE — Progress Notes (Signed)
Patient ID: [name], female   DOB: 04-Feb-1950, 71 y.o.   MRN: [id number]   Reason for Appointment: Follow-up of various problems  History of Present Illness    Diagnosis: Type 2 DIABETES MELITUS, date of diagnosis:  1985  Prior history: She has been on insulin since diagnosis and on Lantus previously Also at some point had been started on Glucophage several years ago also Because of insurance preference Lantus was changed to Levemir  She refuses to use analog rapid acting insulin because of cost and is using regular insulin for several years   Her blood sugars are generally well controlled and A1c usually under 7% Her A1c previously was higher with stopping metformin at 8%  Recent history:   Insulin regimen:  Regular insulin 20-30 minutes Before eating, 10 units a.m. and 12 ac supper  Oral hypoglycemic drugs: None   Current blood sugar patterns, management and problems:  Her A1c is 5.7, was 4.8   However fructosamine is higher at 304 compared to 280  She is using her freestyle libre sensor very intermittently and is dependent on her family members to scan her reader She was told to check her to the availability of Dexcom but this has not been done  She does appear to have significantly high readings after meals as follows After breakfast as high as 260 and after dinner as high as 281 Again her blood sugars overnight appear to be low normal and she may be low normal at times before dinner or late afternoon when she comes back from dialysis Not on basal insulin again Previously her libre sensor was seen to be fairly close to the lab glucose  Her daughter is drawing up her insulin with the syringe and she prefers the insulin vials   Side effects from medications: None      Glucometer:  FreeStyle libre version 2  Blood sugar data incomplete, unable to analyze  Previous data:  CGM use % of time 44  2-week average/GV 104/39  Time in range 80      %   % Time Above 180 8  % Time above 250   % Time Below 70 12 was 26     PRE-MEAL Fasting Lunch Dinner 12 AM-2 AM Overall  Glucose range:       Averages: 86   55 104   POST-MEAL PC Breakfast PC Lunch PC Dinner  Glucose range:     Averages: 150  122     Meals:  usually 2 meals per day at 10 AM and 5 PM.     Mealtime protein sources:turkey, chicken.  Eating cereal for breakfast Or oatmeal Avoiding sweet drinks  Physical activity: exercise: Some walking within the house             Wt Readings from Last 3 Encounters:  12/27/21 126 lb 6.4 oz (57.3 kg)  12/11/21 123 lb (55.8 kg)  08/16/21 126 lb 9.6 oz (57.4 kg)   Lab Results  Component Value Date   HGBA1C 5.7 12/18/2021   HGBA1C 4.8 05/29/2021   HGBA1C 6.1 (H) 04/07/2021   Lab Results  Component Value Date   MICROALBUR 196.7 (H) 03/11/2018   LDLCALC 58 05/29/2021   CREATININE 5.61 (H) 07/10/2021      Lab Results  Component Value Date   FRUCTOSAMINE 304 (H) 12/18/2021   FRUCTOSAMINE 280 08/14/2021   FRUCTOSAMINE 282 05/29/2021   FRUCTOSAMINE 295 (H) 02/03/2021    Other active problems: See review  of systems      Allergies as of 12/27/2021       Reactions   Morphine And Related Other (See Comments)   Hallucinations    Penicillins Swelling, Rash   Throat swelling Did it involve swelling of the face/tongue/throat, SOB, or low BP? Yes Did it involve sudden or severe rash/hives, skin peeling, or any reaction on the inside of your mouth or nose? Yes Did you need to seek medical attention at a hospital or doctor's office? Yes When did it last happen?   young child    If all above answers are "NO", may proceed with cephalosporin use.        Medication List        Accurate as of December 27, 2021  3:49 PM. If you have any questions, ask your nurse or doctor.          STOP taking these medications    insulin glargine 100 UNIT/ML injection Commonly known as: Lantus Stopped by: Elayne Snare, MD    insulin regular 100 units/mL injection Commonly known as: NOVOLIN R Stopped by: Elayne Snare, MD       TAKE these medications    acetaminophen 325 MG tablet Commonly known as: TYLENOL Take 2 tablets (650 mg total) by mouth every 6 (six) hours as needed for mild pain (or Fever >/= 101).   amLODipine 5 MG tablet Commonly known as: NORVASC Take 5 mg by mouth daily.   aspirin EC 325 MG tablet Take 1 tablet (325 mg total) by mouth daily.   atorvastatin 20 MG tablet Commonly known as: LIPITOR Take 1 tablet by mouth once daily   bisacodyl 5 MG EC tablet Commonly known as: DULCOLAX Take 5 mg by mouth daily as needed for moderate constipation.   cilostazol 100 MG tablet Commonly known as: PLETAL Take 1 tablet by mouth twice daily   Dexcom G7 Receiver Kerrin Mo To display CGM data What changed: additional instructions Changed by: Elayne Snare, MD   Dexcom G7 Sensor Misc 1 Device by Does not apply route as directed. Change sensor every 10 days What changed:  how much to take how to take this when to take this additional instructions Changed by: Elayne Snare, MD   docusate sodium 100 MG capsule Commonly known as: COLACE Take 200 mg by mouth daily as needed for mild constipation.   FreeStyle Freedom Kit Use to check blood sugar once a day dx code   FREESTYLE LITE test strip Generic drug: glucose blood USE AS INSTRUCTED TO CHECK BLOOD SUGAR ONCE DAILY.   gabapentin 100 MG capsule Commonly known as: NEURONTIN TAKE 1 CAPSULE BY MOUTH TWICE DAILY AS NEEDED What changed: See the new instructions.   insulin aspart 100 UNIT/ML injection Commonly known as: novoLOG 12 units before breakfast and 14 before supper Started by: Elayne Snare, MD   Insulin Pen Needle 31G X 5 MM Misc Use with pen   lidocaine-prilocaine cream Commonly known as: EMLA 1 application. daily as needed (port access).   pantoprazole 40 MG tablet Commonly known as: PROTONIX Take 1 tablet (40 mg) by mouth  twice daily for 4 weeks, then switch to 1 tablet once daily. What changed:  how much to take how to take this when to take this additional instructions   triamcinolone ointment 0.1 % Commonly known as: KENALOG 1 application  daily.   Vitamin D (Ergocalciferol) 1.25 MG (50000 UNIT) Caps capsule Commonly known as: DRISDOL Take 1 capsule by mouth once a week What  changed:  how much to take how to take this when to take this additional instructions        Allergies:  Allergies  Allergen Reactions   Morphine And Related Other (See Comments)    Hallucinations    Penicillins Swelling and Rash    Throat swelling Did it involve swelling of the face/tongue/throat, SOB, or low BP? Yes Did it involve sudden or severe rash/hives, skin peeling, or any reaction on the inside of your mouth or nose? Yes Did you need to seek medical attention at a hospital or doctor's office? Yes When did it last happen?   young child    If all above answers are "NO", may proceed with cephalosporin use.    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   CHF (congestive heart failure) (HCC)    Chronic kidney disease    Tu/Th/Sa   Diabetes mellitus    Type II per Dr [name]  (patient said type I)   Fibroid    GERD (gastroesophageal reflux disease)    Glaucoma    Hyperlipidemia    Hypertension    Iron deficiency anemia 03/09/2016   Peripheral vascular disease ([place])    Pneumonia 2006   PONV (postoperative nausea and vomiting)    Seizure disorder (HCC)    Shortness of breath dyspnea    with exdrtion, "Walkling too fast"   Stroke ([place])    no residual    Past Surgical History:  Procedure Laterality Date   ABDOMINAL HYSTERECTOMY     AV FISTULA PLACEMENT Left 04/27/2019   Procedure: INSERTION OF ARTERIOVENOUS (AV) GORE-TEX GRAFT LEFT UPPER ARM;  Surgeon: Angelia Mould, MD;   Location: [place];  Service: Vascular;  Laterality: Left;   BIOPSY  06/10/2017   Procedure: BIOPSY;  Surgeon: Ronnette Juniper, MD;  Location: [place];  Service: Gastroenterology;;   BIOPSY  04/10/2021   Procedure: BIOPSY;  Surgeon: Otis Brace, MD;  Location: [place];  Service: Gastroenterology;;   BREAST BIOPSY Right    CERVICAL FUSION     with graft from hip   COLONOSCOPY  July 09, 2012   DIRECT LARYNGOSCOPY N/A 06/07/2014   Procedure: DIRECT LARYNGOSCOPY with BIOPSY and EXCISION VOLLECULAR CYST;  Surgeon: Ruby Cola, MD;  Location: [place];  Service: ENT;  Laterality: N/A;   ESOPHAGOGASTRODUODENOSCOPY (EGD) WITH PROPOFOL Left 06/10/2017   Procedure: ESOPHAGOGASTRODUODENOSCOPY (EGD) WITH PROPOFOL;  Surgeon: Ronnette Juniper, MD;  Location: [place];  Service: Gastroenterology;  Laterality: Left;   ESOPHAGOGASTRODUODENOSCOPY (EGD) WITH PROPOFOL N/A 04/10/2021   Procedure: ESOPHAGOGASTRODUODENOSCOPY (EGD) WITH PROPOFOL;  Surgeon: Otis Brace, MD;  Location: [place];  Service: Gastroenterology;  Laterality: N/A;   FLEXIBLE SIGMOIDOSCOPY Left 06/10/2017   Procedure: FLEXIBLE SIGMOIDOSCOPY;  Surgeon: Ronnette Juniper, MD;  Location: [place];  Service: Gastroenterology;  Laterality: Left;   HOT HEMOSTASIS N/A 04/10/2021   Procedure: HOT HEMOSTASIS (ARGON PLASMA COAGULATION/BICAP);  Surgeon: Otis Brace, MD;  Location: [facility] ENDOSCOPY;  Service: Gastroenterology;  Laterality: N/A;   LOOP RECORDER INSERTION N/A 06/20/2016   Procedure: Loop Recorder Insertion;  Surgeon: [name], MD;  Location: [place] CV LAB;  Service: Cardiovascular;  Laterality: N/A;   REFRACTIVE SURGERY Bilateral    both eyes   SPINE SURGERY     lumbar    Family History  Problem Relation Age of Onset   Cancer  Mother    Heart disease Mother    Diabetes Father    Cancer Brother    Cancer Brother    Throat cancer Brother     Social History:  reports that she has been smoking cigarettes. She  has a 30.00 pack-year smoking history. She has never used smokeless tobacco. She reports that she does not currently use alcohol. She reports that she does not currently use drugs after having used the following drugs: Methylphenidate.  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     HYPERLIPIDEMIA: The lipid abnormality consists of elevated LDL controlled with Lipitor 20 mg .   Her baseline LDL was 202  LDL is consistently well controlled and triglycerides have been normal .  Lab Results  Component Value Date   CHOL 146 05/29/2021   HDL 67.40 05/29/2021   LDLCALC 58 05/29/2021   TRIG 100.0 05/29/2021   CHOLHDL 2 05/29/2021       Last diabetic foot exam was done in 8/22 by podiatrist She has some vascular disease on the left side followed by vascular specialist  Has been taking gabapentin 154m twice daily for lower leg leg pains with relief Has numbness in feet   She has had her Covid vaccines   Examination:   BP 128/60   Pulse 92   Ht 5' 3" (1.6 m)   Wt 126 lb 6.4 oz (57.3 kg)   SpO2 98%   BMI 22.39 kg/m   Body mass index is 22.39 kg/m.    ASSESSMENT/ PLAN:     Diabetes type 2 on insulin See history of present illness for detailed discussion of current diabetes management, blood sugar patterns and problems identified  Her A1c is falsely low but fructosamine is higher than before at 308  She is on mealtime insulin with regular insulin  Her A1c may not be accurate because of anemia and fructosamine is upper normal at 280  She is still having significantly high postprandial readings as judged by the recent lArgylebut not requiring basal  insulin    Recommendations: Prescription sent for Dexcom G7 to the [facility] and her daughter will follow-up on this Instead of regular insulin she will use NovoLog 12 units before breakfast and 14 before dinner and let uKoreaknow if blood sugars are not evenly controlled This can be injected 15 minutes before meals    Patient Instructions  Change insulin to Novolog 12 units in am and 14 at supper  15 min before meals  AElayne Snare11/29/2023, 3:49 PM     Note: This office note was prepared with Dragon voice recognition system technology. Any transcriptional errors that result from this process are unintentional.

## 2021-12-28 DIAGNOSIS — Z992 Dependence on renal dialysis: Secondary | ICD-10-CM | POA: Diagnosis not present

## 2021-12-28 DIAGNOSIS — N186 End stage renal disease: Secondary | ICD-10-CM | POA: Diagnosis not present

## 2021-12-28 DIAGNOSIS — E1122 Type 2 diabetes mellitus with diabetic chronic kidney disease: Secondary | ICD-10-CM | POA: Diagnosis not present

## 2021-12-28 DIAGNOSIS — N2581 Secondary hyperparathyroidism of renal origin: Secondary | ICD-10-CM | POA: Diagnosis not present

## 2021-12-30 DIAGNOSIS — N186 End stage renal disease: Secondary | ICD-10-CM | POA: Diagnosis not present

## 2021-12-30 DIAGNOSIS — Z992 Dependence on renal dialysis: Secondary | ICD-10-CM | POA: Diagnosis not present

## 2021-12-30 DIAGNOSIS — N2581 Secondary hyperparathyroidism of renal origin: Secondary | ICD-10-CM | POA: Diagnosis not present

## 2022-01-02 DIAGNOSIS — N186 End stage renal disease: Secondary | ICD-10-CM | POA: Diagnosis not present

## 2022-01-02 DIAGNOSIS — Z992 Dependence on renal dialysis: Secondary | ICD-10-CM | POA: Diagnosis not present

## 2022-01-02 DIAGNOSIS — N2581 Secondary hyperparathyroidism of renal origin: Secondary | ICD-10-CM | POA: Diagnosis not present

## 2022-01-03 ENCOUNTER — Other Ambulatory Visit: Payer: Self-pay

## 2022-01-03 ENCOUNTER — Telehealth: Payer: Self-pay

## 2022-01-03 DIAGNOSIS — E1165 Type 2 diabetes mellitus with hyperglycemia: Secondary | ICD-10-CM

## 2022-01-03 MED ORDER — INSULIN ASPART 100 UNIT/ML IJ SOLN: INTRAMUSCULAR | 2 refills | Status: DC

## 2022-01-03 NOTE — Telephone Encounter (Signed)
Daughter left voicemail stating [facility] has been faxing over for update on Rx. I have not received anything. I did resend Rx with diagnosis code vm left on daughter vm as well.

## 2022-01-04 DIAGNOSIS — N186 End stage renal disease: Secondary | ICD-10-CM | POA: Diagnosis not present

## 2022-01-04 DIAGNOSIS — N2581 Secondary hyperparathyroidism of renal origin: Secondary | ICD-10-CM | POA: Diagnosis not present

## 2022-01-04 DIAGNOSIS — Z992 Dependence on renal dialysis: Secondary | ICD-10-CM | POA: Diagnosis not present

## 2022-01-09 DIAGNOSIS — N2581 Secondary hyperparathyroidism of renal origin: Secondary | ICD-10-CM | POA: Diagnosis not present

## 2022-01-09 DIAGNOSIS — N186 End stage renal disease: Secondary | ICD-10-CM | POA: Diagnosis not present

## 2022-01-09 DIAGNOSIS — Z992 Dependence on renal dialysis: Secondary | ICD-10-CM | POA: Diagnosis not present

## 2022-01-11 DIAGNOSIS — N2581 Secondary hyperparathyroidism of renal origin: Secondary | ICD-10-CM | POA: Diagnosis not present

## 2022-01-11 DIAGNOSIS — Z992 Dependence on renal dialysis: Secondary | ICD-10-CM | POA: Diagnosis not present

## 2022-01-11 DIAGNOSIS — N186 End stage renal disease: Secondary | ICD-10-CM | POA: Diagnosis not present

## 2022-01-13 DIAGNOSIS — N2581 Secondary hyperparathyroidism of renal origin: Secondary | ICD-10-CM | POA: Diagnosis not present

## 2022-01-13 DIAGNOSIS — N186 End stage renal disease: Secondary | ICD-10-CM | POA: Diagnosis not present

## 2022-01-13 DIAGNOSIS — Z992 Dependence on renal dialysis: Secondary | ICD-10-CM | POA: Diagnosis not present

## 2022-01-15 DIAGNOSIS — N186 End stage renal disease: Secondary | ICD-10-CM | POA: Diagnosis not present

## 2022-01-15 DIAGNOSIS — Z992 Dependence on renal dialysis: Secondary | ICD-10-CM | POA: Diagnosis not present

## 2022-01-15 DIAGNOSIS — N2581 Secondary hyperparathyroidism of renal origin: Secondary | ICD-10-CM | POA: Diagnosis not present

## 2022-01-18 DIAGNOSIS — N2581 Secondary hyperparathyroidism of renal origin: Secondary | ICD-10-CM | POA: Diagnosis not present

## 2022-01-18 DIAGNOSIS — N186 End stage renal disease: Secondary | ICD-10-CM | POA: Diagnosis not present

## 2022-01-18 DIAGNOSIS — Z992 Dependence on renal dialysis: Secondary | ICD-10-CM | POA: Diagnosis not present

## 2022-01-20 DIAGNOSIS — N186 End stage renal disease: Secondary | ICD-10-CM | POA: Diagnosis not present

## 2022-01-20 DIAGNOSIS — Z992 Dependence on renal dialysis: Secondary | ICD-10-CM | POA: Diagnosis not present

## 2022-01-20 DIAGNOSIS — N2581 Secondary hyperparathyroidism of renal origin: Secondary | ICD-10-CM | POA: Diagnosis not present

## 2022-01-23 DIAGNOSIS — N2581 Secondary hyperparathyroidism of renal origin: Secondary | ICD-10-CM | POA: Diagnosis not present

## 2022-01-23 DIAGNOSIS — Z992 Dependence on renal dialysis: Secondary | ICD-10-CM | POA: Diagnosis not present

## 2022-01-23 DIAGNOSIS — N186 End stage renal disease: Secondary | ICD-10-CM | POA: Diagnosis not present

## 2022-01-24 ENCOUNTER — Ambulatory Visit: Payer: PRIVATE HEALTH INSURANCE | Admitting: Podiatry

## 2022-01-25 DIAGNOSIS — Z992 Dependence on renal dialysis: Secondary | ICD-10-CM | POA: Diagnosis not present

## 2022-01-25 DIAGNOSIS — N186 End stage renal disease: Secondary | ICD-10-CM | POA: Diagnosis not present

## 2022-01-25 DIAGNOSIS — N2581 Secondary hyperparathyroidism of renal origin: Secondary | ICD-10-CM | POA: Diagnosis not present

## 2022-01-27 DIAGNOSIS — Z992 Dependence on renal dialysis: Secondary | ICD-10-CM | POA: Diagnosis not present

## 2022-01-27 DIAGNOSIS — N186 End stage renal disease: Secondary | ICD-10-CM | POA: Diagnosis not present

## 2022-01-27 DIAGNOSIS — N2581 Secondary hyperparathyroidism of renal origin: Secondary | ICD-10-CM | POA: Diagnosis not present

## 2022-01-28 DIAGNOSIS — E1122 Type 2 diabetes mellitus with diabetic chronic kidney disease: Secondary | ICD-10-CM | POA: Diagnosis not present

## 2022-01-29 DIAGNOSIS — E1122 Type 2 diabetes mellitus with diabetic chronic kidney disease: Secondary | ICD-10-CM | POA: Diagnosis not present

## 2022-01-29 DIAGNOSIS — Z992 Dependence on renal dialysis: Secondary | ICD-10-CM | POA: Diagnosis not present

## 2022-01-29 DIAGNOSIS — N186 End stage renal disease: Secondary | ICD-10-CM | POA: Diagnosis not present

## 2022-02-02 DIAGNOSIS — D631 Anemia in chronic kidney disease: Secondary | ICD-10-CM | POA: Diagnosis not present

## 2022-02-02 DIAGNOSIS — N2581 Secondary hyperparathyroidism of renal origin: Secondary | ICD-10-CM | POA: Diagnosis not present

## 2022-02-02 DIAGNOSIS — Z992 Dependence on renal dialysis: Secondary | ICD-10-CM | POA: Diagnosis not present

## 2022-02-02 DIAGNOSIS — N186 End stage renal disease: Secondary | ICD-10-CM | POA: Diagnosis not present

## 2022-02-03 ENCOUNTER — Telehealth: Payer: Self-pay | Admitting: Endocrinology

## 2022-02-03 DIAGNOSIS — E1165 Type 2 diabetes mellitus with hyperglycemia: Secondary | ICD-10-CM

## 2022-02-03 DIAGNOSIS — E782 Mixed hyperlipidemia: Secondary | ICD-10-CM

## 2022-02-05 DIAGNOSIS — N186 End stage renal disease: Secondary | ICD-10-CM | POA: Diagnosis not present

## 2022-02-05 DIAGNOSIS — E1122 Type 2 diabetes mellitus with diabetic chronic kidney disease: Secondary | ICD-10-CM | POA: Diagnosis not present

## 2022-02-05 DIAGNOSIS — Z992 Dependence on renal dialysis: Secondary | ICD-10-CM | POA: Diagnosis not present

## 2022-02-05 DIAGNOSIS — D631 Anemia in chronic kidney disease: Secondary | ICD-10-CM | POA: Diagnosis not present

## 2022-02-05 DIAGNOSIS — N2581 Secondary hyperparathyroidism of renal origin: Secondary | ICD-10-CM | POA: Diagnosis not present

## 2022-02-07 ENCOUNTER — Ambulatory Visit: Payer: PRIVATE HEALTH INSURANCE

## 2022-02-07 ENCOUNTER — Ambulatory Visit (INDEPENDENT_AMBULATORY_CARE_PROVIDER_SITE_OTHER): Payer: Medicare Other | Admitting: Podiatry

## 2022-02-07 VITALS — BP 130/68

## 2022-02-07 DIAGNOSIS — E1151 Type 2 diabetes mellitus with diabetic peripheral angiopathy without gangrene: Secondary | ICD-10-CM

## 2022-02-07 DIAGNOSIS — B351 Tinea unguium: Secondary | ICD-10-CM

## 2022-02-07 DIAGNOSIS — M79674 Pain in right toe(s): Secondary | ICD-10-CM | POA: Diagnosis not present

## 2022-02-07 DIAGNOSIS — M79675 Pain in left toe(s): Secondary | ICD-10-CM | POA: Diagnosis not present

## 2022-02-07 NOTE — Progress Notes (Signed)
Patient presents to the office today for diabetic shoe and insole measuring.  Patient was measured with brannock device to determine size and width for 1 pair of extra depth shoes and foam casted for 3 pair of insoles.   Documentation of medical necessity will be sent to patient's treating diabetic doctor to verify and sign.   Patient's diabetic provider: Elayne Snare  Shoes and insoles will be ordered at that time and patient will be notified for an appointment for fitting when they arrive.   Shoe size (per patient): 8   Brannock measurement: 8  Patient shoe selection-   Shoe choice:    [id number] new balance [id number] apex linda    Shoe size ordered: 8

## 2022-02-07 NOTE — Progress Notes (Signed)
  Subjective:  Patient ID: Julie Brewer, female    DOB: 1950-12-18,  MRN: [id number]  Chief Complaint  Patient presents with   Nail Problem    Nail trim    72 y.o. female returns for the above complaint.  Patient is a type II diabetic whose A1c is 6.1.  Patient presents with thickened elongated mycotic toenails x10.  Patient states that she is not able to cut it herself.  She would like to bring [place] to debride them down.  She does not have any secondary complaints Objective:   Vitals:   02/07/22 1100  BP: 130/68   Podiatric Exam: Vascular: dorsalis pedis and posterior tibial pulses are palpable bilateral. Capillary return is immediate. Temperature gradient is WNL. Skin turgor WNL  Sensorium: Normal Semmes Weinstein monofilament test. Normal tactile sensation bilaterally. Nail Exam: Pt has thick disfigured discolored nails with subungual debris noted bilateral entire nail hallux through fifth toenails Ulcer Exam: There is no evidence of ulcer or pre-ulcerative changes or infection. Orthopedic Exam: Muscle tone and strength are WNL. No limitations in general ROM. No crepitus or effusions noted. HAV  B/L.  Hammer toes 2-5  B/L. Skin: No Porokeratosis. No infection or ulcers.  Submet 3 hyperkeratotic lesion/callus x2.  Pain on palpation.  Assessment & Plan:  Patient was evaluated and treated and all questions answered.  Hammertoe contractures bilateral two through five -Clinically doing well and for management has been fine without ulcers -Awaiting diabetic shoes  Onychomycosis with pain  -Nails palliatively debrided as below. -Educated on self-care  Procedure: Nail Debridement Rationale: pain  Type of Debridement: manual, sharp debridement. Instrumentation: Nail nipper, rotary burr. Number of Nails: 10  Procedures and Treatment: Consent by patient was obtained for treatment procedures. The patient understood the discussion of treatment and procedures well. All questions  were answered thoroughly reviewed. Debridement of mycotic and hypertrophic toenails, 1 through 5 bilateral and clearing of subungual debris. No ulceration, no infection noted.  Return Visit-Office Procedure: Patient instructed to return to the office for a follow up visit 3 months for continued evaluation and treatment.  Boneta Lucks, DPM    No follow-ups on file.  Toenails x10.  Hammertoe bilateral diabetic shoes

## 2022-02-08 ENCOUNTER — Telehealth: Payer: Self-pay

## 2022-02-08 NOTE — Telephone Encounter (Signed)
Returned pt's daughters call left on triage line. Voicemail was left for her to call us back if assistance is still needed.

## 2022-02-08 NOTE — Addendum Note (Signed)
Addended by: Boneta Lucks on: 02/08/2022 08:08 AM   Modules accepted: Orders

## 2022-02-09 DIAGNOSIS — D631 Anemia in chronic kidney disease: Secondary | ICD-10-CM | POA: Diagnosis not present

## 2022-02-09 DIAGNOSIS — N186 End stage renal disease: Secondary | ICD-10-CM | POA: Diagnosis not present

## 2022-02-09 DIAGNOSIS — N2581 Secondary hyperparathyroidism of renal origin: Secondary | ICD-10-CM | POA: Diagnosis not present

## 2022-02-09 DIAGNOSIS — Z992 Dependence on renal dialysis: Secondary | ICD-10-CM | POA: Diagnosis not present

## 2022-02-12 DIAGNOSIS — N2581 Secondary hyperparathyroidism of renal origin: Secondary | ICD-10-CM | POA: Diagnosis not present

## 2022-02-12 DIAGNOSIS — Z992 Dependence on renal dialysis: Secondary | ICD-10-CM | POA: Diagnosis not present

## 2022-02-12 DIAGNOSIS — N186 End stage renal disease: Secondary | ICD-10-CM | POA: Diagnosis not present

## 2022-02-12 DIAGNOSIS — D631 Anemia in chronic kidney disease: Secondary | ICD-10-CM | POA: Diagnosis not present

## 2022-02-14 ENCOUNTER — Other Ambulatory Visit: Payer: Self-pay

## 2022-02-14 DIAGNOSIS — N2581 Secondary hyperparathyroidism of renal origin: Secondary | ICD-10-CM | POA: Diagnosis not present

## 2022-02-14 DIAGNOSIS — D631 Anemia in chronic kidney disease: Secondary | ICD-10-CM | POA: Diagnosis not present

## 2022-02-14 DIAGNOSIS — E1165 Type 2 diabetes mellitus with hyperglycemia: Secondary | ICD-10-CM

## 2022-02-14 DIAGNOSIS — N186 End stage renal disease: Secondary | ICD-10-CM | POA: Diagnosis not present

## 2022-02-14 DIAGNOSIS — Z992 Dependence on renal dialysis: Secondary | ICD-10-CM | POA: Diagnosis not present

## 2022-02-14 MED ORDER — GABAPENTIN 100 MG PO CAPS: ORAL_CAPSULE | ORAL | 0 refills | Status: DC

## 2022-02-14 NOTE — Telephone Encounter (Signed)
Patient's daughter came in to advise that patient's primary pharmacy is the [facility] on [address], [place], [place] .  Requesting that the Gabapentin that was sent to the [facility] in [place] please be resent to the new pharmacy of record

## 2022-02-16 DIAGNOSIS — N186 End stage renal disease: Secondary | ICD-10-CM | POA: Diagnosis not present

## 2022-02-16 DIAGNOSIS — N2581 Secondary hyperparathyroidism of renal origin: Secondary | ICD-10-CM | POA: Diagnosis not present

## 2022-02-16 DIAGNOSIS — Z992 Dependence on renal dialysis: Secondary | ICD-10-CM | POA: Diagnosis not present

## 2022-02-16 DIAGNOSIS — D631 Anemia in chronic kidney disease: Secondary | ICD-10-CM | POA: Diagnosis not present

## 2022-02-16 MED ORDER — INSULIN ASPART 100 UNIT/ML IJ SOLN: INTRAMUSCULAR | 2 refills | Status: DC

## 2022-02-16 MED ORDER — ATORVASTATIN CALCIUM 20 MG PO TABS: 20.0000 mg | ORAL_TABLET | Freq: Every day | ORAL | 1 refills | Status: DC

## 2022-02-16 MED ORDER — DEXCOM G7 SENSOR MISC: 1.0000 | 3 refills | Status: DC

## 2022-02-16 NOTE — Addendum Note (Signed)
Addended by: Cinda Quest on: 02/16/2022 03:53 PM   Modules accepted: Orders

## 2022-02-16 NOTE — Telephone Encounter (Signed)
Novolog, atorvastatin, Dexcom G7 have been sent to pharmacy

## 2022-02-16 NOTE — Telephone Encounter (Signed)
Daughter called office to request that all of patients prescriptions be renewed (excluding the recent Gabapentin) and sent to the [facility] on [address], [place], [place] .  States that they are having problems with the [facility] location transferring RX to [place] location

## 2022-02-19 DIAGNOSIS — D631 Anemia in chronic kidney disease: Secondary | ICD-10-CM | POA: Diagnosis not present

## 2022-02-19 DIAGNOSIS — N2581 Secondary hyperparathyroidism of renal origin: Secondary | ICD-10-CM | POA: Diagnosis not present

## 2022-02-19 DIAGNOSIS — Z992 Dependence on renal dialysis: Secondary | ICD-10-CM | POA: Diagnosis not present

## 2022-02-19 DIAGNOSIS — N186 End stage renal disease: Secondary | ICD-10-CM | POA: Diagnosis not present

## 2022-02-21 DIAGNOSIS — D631 Anemia in chronic kidney disease: Secondary | ICD-10-CM | POA: Diagnosis not present

## 2022-02-21 DIAGNOSIS — N2581 Secondary hyperparathyroidism of renal origin: Secondary | ICD-10-CM | POA: Diagnosis not present

## 2022-02-21 DIAGNOSIS — Z992 Dependence on renal dialysis: Secondary | ICD-10-CM | POA: Diagnosis not present

## 2022-02-21 DIAGNOSIS — N186 End stage renal disease: Secondary | ICD-10-CM | POA: Diagnosis not present

## 2022-02-23 DIAGNOSIS — D631 Anemia in chronic kidney disease: Secondary | ICD-10-CM | POA: Diagnosis not present

## 2022-02-23 DIAGNOSIS — Z992 Dependence on renal dialysis: Secondary | ICD-10-CM | POA: Diagnosis not present

## 2022-02-23 DIAGNOSIS — N186 End stage renal disease: Secondary | ICD-10-CM | POA: Diagnosis not present

## 2022-02-23 DIAGNOSIS — N2581 Secondary hyperparathyroidism of renal origin: Secondary | ICD-10-CM | POA: Diagnosis not present

## 2022-02-26 DIAGNOSIS — Z992 Dependence on renal dialysis: Secondary | ICD-10-CM | POA: Diagnosis not present

## 2022-02-26 DIAGNOSIS — N2581 Secondary hyperparathyroidism of renal origin: Secondary | ICD-10-CM | POA: Diagnosis not present

## 2022-02-26 DIAGNOSIS — D631 Anemia in chronic kidney disease: Secondary | ICD-10-CM | POA: Diagnosis not present

## 2022-02-26 DIAGNOSIS — N186 End stage renal disease: Secondary | ICD-10-CM | POA: Diagnosis not present

## 2022-02-28 DIAGNOSIS — N2581 Secondary hyperparathyroidism of renal origin: Secondary | ICD-10-CM | POA: Diagnosis not present

## 2022-02-28 DIAGNOSIS — N186 End stage renal disease: Secondary | ICD-10-CM | POA: Diagnosis not present

## 2022-02-28 DIAGNOSIS — D631 Anemia in chronic kidney disease: Secondary | ICD-10-CM | POA: Diagnosis not present

## 2022-02-28 DIAGNOSIS — Z992 Dependence on renal dialysis: Secondary | ICD-10-CM | POA: Diagnosis not present

## 2022-03-01 DIAGNOSIS — E1122 Type 2 diabetes mellitus with diabetic chronic kidney disease: Secondary | ICD-10-CM | POA: Diagnosis not present

## 2022-03-01 DIAGNOSIS — Z992 Dependence on renal dialysis: Secondary | ICD-10-CM | POA: Diagnosis not present

## 2022-03-01 DIAGNOSIS — N186 End stage renal disease: Secondary | ICD-10-CM | POA: Diagnosis not present

## 2022-03-02 DIAGNOSIS — Z992 Dependence on renal dialysis: Secondary | ICD-10-CM | POA: Diagnosis not present

## 2022-03-02 DIAGNOSIS — D509 Iron deficiency anemia, unspecified: Secondary | ICD-10-CM | POA: Diagnosis not present

## 2022-03-02 DIAGNOSIS — D631 Anemia in chronic kidney disease: Secondary | ICD-10-CM | POA: Diagnosis not present

## 2022-03-02 DIAGNOSIS — N186 End stage renal disease: Secondary | ICD-10-CM | POA: Diagnosis not present

## 2022-03-02 DIAGNOSIS — N2581 Secondary hyperparathyroidism of renal origin: Secondary | ICD-10-CM | POA: Diagnosis not present

## 2022-03-05 DIAGNOSIS — D509 Iron deficiency anemia, unspecified: Secondary | ICD-10-CM | POA: Diagnosis not present

## 2022-03-05 DIAGNOSIS — N186 End stage renal disease: Secondary | ICD-10-CM | POA: Diagnosis not present

## 2022-03-05 DIAGNOSIS — D631 Anemia in chronic kidney disease: Secondary | ICD-10-CM | POA: Diagnosis not present

## 2022-03-05 DIAGNOSIS — N2581 Secondary hyperparathyroidism of renal origin: Secondary | ICD-10-CM | POA: Diagnosis not present

## 2022-03-05 DIAGNOSIS — Z992 Dependence on renal dialysis: Secondary | ICD-10-CM | POA: Diagnosis not present

## 2022-03-07 DIAGNOSIS — N186 End stage renal disease: Secondary | ICD-10-CM | POA: Diagnosis not present

## 2022-03-07 DIAGNOSIS — D509 Iron deficiency anemia, unspecified: Secondary | ICD-10-CM | POA: Diagnosis not present

## 2022-03-07 DIAGNOSIS — D631 Anemia in chronic kidney disease: Secondary | ICD-10-CM | POA: Diagnosis not present

## 2022-03-07 DIAGNOSIS — Z992 Dependence on renal dialysis: Secondary | ICD-10-CM | POA: Diagnosis not present

## 2022-03-07 DIAGNOSIS — N2581 Secondary hyperparathyroidism of renal origin: Secondary | ICD-10-CM | POA: Diagnosis not present

## 2022-03-09 DIAGNOSIS — Z992 Dependence on renal dialysis: Secondary | ICD-10-CM | POA: Diagnosis not present

## 2022-03-09 DIAGNOSIS — D509 Iron deficiency anemia, unspecified: Secondary | ICD-10-CM | POA: Diagnosis not present

## 2022-03-09 DIAGNOSIS — N186 End stage renal disease: Secondary | ICD-10-CM | POA: Diagnosis not present

## 2022-03-09 DIAGNOSIS — N2581 Secondary hyperparathyroidism of renal origin: Secondary | ICD-10-CM | POA: Diagnosis not present

## 2022-03-09 DIAGNOSIS — D631 Anemia in chronic kidney disease: Secondary | ICD-10-CM | POA: Diagnosis not present

## 2022-03-12 DIAGNOSIS — N2581 Secondary hyperparathyroidism of renal origin: Secondary | ICD-10-CM | POA: Diagnosis not present

## 2022-03-12 DIAGNOSIS — Z992 Dependence on renal dialysis: Secondary | ICD-10-CM | POA: Diagnosis not present

## 2022-03-12 DIAGNOSIS — D509 Iron deficiency anemia, unspecified: Secondary | ICD-10-CM | POA: Diagnosis not present

## 2022-03-12 DIAGNOSIS — D631 Anemia in chronic kidney disease: Secondary | ICD-10-CM | POA: Diagnosis not present

## 2022-03-12 DIAGNOSIS — N186 End stage renal disease: Secondary | ICD-10-CM | POA: Diagnosis not present

## 2022-03-13 DIAGNOSIS — Z992 Dependence on renal dialysis: Secondary | ICD-10-CM | POA: Diagnosis not present

## 2022-03-13 DIAGNOSIS — I871 Compression of vein: Secondary | ICD-10-CM | POA: Diagnosis not present

## 2022-03-13 DIAGNOSIS — N186 End stage renal disease: Secondary | ICD-10-CM | POA: Diagnosis not present

## 2022-03-13 DIAGNOSIS — T82858A Stenosis of vascular prosthetic devices, implants and grafts, initial encounter: Secondary | ICD-10-CM | POA: Diagnosis not present

## 2022-03-14 DIAGNOSIS — Z992 Dependence on renal dialysis: Secondary | ICD-10-CM | POA: Diagnosis not present

## 2022-03-14 DIAGNOSIS — N186 End stage renal disease: Secondary | ICD-10-CM | POA: Diagnosis not present

## 2022-03-14 DIAGNOSIS — D509 Iron deficiency anemia, unspecified: Secondary | ICD-10-CM | POA: Diagnosis not present

## 2022-03-14 DIAGNOSIS — N2581 Secondary hyperparathyroidism of renal origin: Secondary | ICD-10-CM | POA: Diagnosis not present

## 2022-03-14 DIAGNOSIS — D631 Anemia in chronic kidney disease: Secondary | ICD-10-CM | POA: Diagnosis not present

## 2022-03-16 DIAGNOSIS — N186 End stage renal disease: Secondary | ICD-10-CM | POA: Diagnosis not present

## 2022-03-16 DIAGNOSIS — N2581 Secondary hyperparathyroidism of renal origin: Secondary | ICD-10-CM | POA: Diagnosis not present

## 2022-03-16 DIAGNOSIS — D509 Iron deficiency anemia, unspecified: Secondary | ICD-10-CM | POA: Diagnosis not present

## 2022-03-16 DIAGNOSIS — Z992 Dependence on renal dialysis: Secondary | ICD-10-CM | POA: Diagnosis not present

## 2022-03-16 DIAGNOSIS — D631 Anemia in chronic kidney disease: Secondary | ICD-10-CM | POA: Diagnosis not present

## 2022-03-19 DIAGNOSIS — N186 End stage renal disease: Secondary | ICD-10-CM | POA: Diagnosis not present

## 2022-03-19 DIAGNOSIS — Z992 Dependence on renal dialysis: Secondary | ICD-10-CM | POA: Diagnosis not present

## 2022-03-19 DIAGNOSIS — D509 Iron deficiency anemia, unspecified: Secondary | ICD-10-CM | POA: Diagnosis not present

## 2022-03-19 DIAGNOSIS — D631 Anemia in chronic kidney disease: Secondary | ICD-10-CM | POA: Diagnosis not present

## 2022-03-19 DIAGNOSIS — N2581 Secondary hyperparathyroidism of renal origin: Secondary | ICD-10-CM | POA: Diagnosis not present

## 2022-03-21 DIAGNOSIS — Z992 Dependence on renal dialysis: Secondary | ICD-10-CM | POA: Diagnosis not present

## 2022-03-21 DIAGNOSIS — D631 Anemia in chronic kidney disease: Secondary | ICD-10-CM | POA: Diagnosis not present

## 2022-03-21 DIAGNOSIS — N186 End stage renal disease: Secondary | ICD-10-CM | POA: Diagnosis not present

## 2022-03-21 DIAGNOSIS — N2581 Secondary hyperparathyroidism of renal origin: Secondary | ICD-10-CM | POA: Diagnosis not present

## 2022-03-21 DIAGNOSIS — D509 Iron deficiency anemia, unspecified: Secondary | ICD-10-CM | POA: Diagnosis not present

## 2022-03-23 DIAGNOSIS — N186 End stage renal disease: Secondary | ICD-10-CM | POA: Diagnosis not present

## 2022-03-23 DIAGNOSIS — Z992 Dependence on renal dialysis: Secondary | ICD-10-CM | POA: Diagnosis not present

## 2022-03-23 DIAGNOSIS — D631 Anemia in chronic kidney disease: Secondary | ICD-10-CM | POA: Diagnosis not present

## 2022-03-23 DIAGNOSIS — N2581 Secondary hyperparathyroidism of renal origin: Secondary | ICD-10-CM | POA: Diagnosis not present

## 2022-03-23 DIAGNOSIS — D509 Iron deficiency anemia, unspecified: Secondary | ICD-10-CM | POA: Diagnosis not present

## 2022-03-26 ENCOUNTER — Other Ambulatory Visit: Payer: Self-pay | Admitting: Endocrinology

## 2022-03-26 DIAGNOSIS — D631 Anemia in chronic kidney disease: Secondary | ICD-10-CM | POA: Diagnosis not present

## 2022-03-26 DIAGNOSIS — Z992 Dependence on renal dialysis: Secondary | ICD-10-CM | POA: Diagnosis not present

## 2022-03-26 DIAGNOSIS — N2581 Secondary hyperparathyroidism of renal origin: Secondary | ICD-10-CM | POA: Diagnosis not present

## 2022-03-26 DIAGNOSIS — D509 Iron deficiency anemia, unspecified: Secondary | ICD-10-CM | POA: Diagnosis not present

## 2022-03-26 DIAGNOSIS — N186 End stage renal disease: Secondary | ICD-10-CM | POA: Diagnosis not present

## 2022-03-26 DIAGNOSIS — E782 Mixed hyperlipidemia: Secondary | ICD-10-CM

## 2022-03-28 DIAGNOSIS — N186 End stage renal disease: Secondary | ICD-10-CM | POA: Diagnosis not present

## 2022-03-28 DIAGNOSIS — D509 Iron deficiency anemia, unspecified: Secondary | ICD-10-CM | POA: Diagnosis not present

## 2022-03-28 DIAGNOSIS — D631 Anemia in chronic kidney disease: Secondary | ICD-10-CM | POA: Diagnosis not present

## 2022-03-28 DIAGNOSIS — N2581 Secondary hyperparathyroidism of renal origin: Secondary | ICD-10-CM | POA: Diagnosis not present

## 2022-03-28 DIAGNOSIS — Z992 Dependence on renal dialysis: Secondary | ICD-10-CM | POA: Diagnosis not present

## 2022-03-30 DIAGNOSIS — E782 Mixed hyperlipidemia: Secondary | ICD-10-CM | POA: Diagnosis not present

## 2022-03-30 DIAGNOSIS — N186 End stage renal disease: Secondary | ICD-10-CM | POA: Diagnosis not present

## 2022-03-30 DIAGNOSIS — D631 Anemia in chronic kidney disease: Secondary | ICD-10-CM | POA: Diagnosis not present

## 2022-03-30 DIAGNOSIS — Z794 Long term (current) use of insulin: Secondary | ICD-10-CM | POA: Diagnosis not present

## 2022-03-30 DIAGNOSIS — E1165 Type 2 diabetes mellitus with hyperglycemia: Secondary | ICD-10-CM | POA: Diagnosis not present

## 2022-03-30 DIAGNOSIS — N2581 Secondary hyperparathyroidism of renal origin: Secondary | ICD-10-CM | POA: Diagnosis not present

## 2022-03-30 DIAGNOSIS — Z992 Dependence on renal dialysis: Secondary | ICD-10-CM | POA: Diagnosis not present

## 2022-03-30 DIAGNOSIS — E1122 Type 2 diabetes mellitus with diabetic chronic kidney disease: Secondary | ICD-10-CM | POA: Diagnosis not present

## 2022-04-02 ENCOUNTER — Other Ambulatory Visit (INDEPENDENT_AMBULATORY_CARE_PROVIDER_SITE_OTHER): Payer: Medicare Other

## 2022-04-02 DIAGNOSIS — Z794 Long term (current) use of insulin: Secondary | ICD-10-CM | POA: Diagnosis not present

## 2022-04-02 DIAGNOSIS — Z992 Dependence on renal dialysis: Secondary | ICD-10-CM | POA: Diagnosis not present

## 2022-04-02 DIAGNOSIS — D631 Anemia in chronic kidney disease: Secondary | ICD-10-CM | POA: Diagnosis not present

## 2022-04-02 DIAGNOSIS — N186 End stage renal disease: Secondary | ICD-10-CM | POA: Diagnosis not present

## 2022-04-02 DIAGNOSIS — E1165 Type 2 diabetes mellitus with hyperglycemia: Secondary | ICD-10-CM

## 2022-04-02 DIAGNOSIS — N2581 Secondary hyperparathyroidism of renal origin: Secondary | ICD-10-CM | POA: Diagnosis not present

## 2022-04-02 DIAGNOSIS — E782 Mixed hyperlipidemia: Secondary | ICD-10-CM | POA: Diagnosis not present

## 2022-04-02 LAB — LDL CHOLESTEROL, DIRECT: Direct LDL: 37 mg/dL

## 2022-04-02 LAB — GLUCOSE, RANDOM: Glucose, Bld: 89 mg/dL (ref 70–99)

## 2022-04-03 LAB — FRUCTOSAMINE: Fructosamine: 289 umol/L — ABNORMAL HIGH (ref 0–285)

## 2022-04-04 ENCOUNTER — Encounter: Payer: Self-pay | Admitting: Endocrinology

## 2022-04-04 ENCOUNTER — Ambulatory Visit: Payer: Medicare Other | Admitting: Endocrinology

## 2022-04-04 VITALS — BP 114/60 | HR 60 | Ht 63.0 in | Wt 124.4 lb

## 2022-04-04 DIAGNOSIS — D631 Anemia in chronic kidney disease: Secondary | ICD-10-CM | POA: Diagnosis not present

## 2022-04-04 DIAGNOSIS — N2581 Secondary hyperparathyroidism of renal origin: Secondary | ICD-10-CM | POA: Diagnosis not present

## 2022-04-04 DIAGNOSIS — Z794 Long term (current) use of insulin: Secondary | ICD-10-CM

## 2022-04-04 DIAGNOSIS — E1165 Type 2 diabetes mellitus with hyperglycemia: Secondary | ICD-10-CM

## 2022-04-04 DIAGNOSIS — E782 Mixed hyperlipidemia: Secondary | ICD-10-CM | POA: Diagnosis not present

## 2022-04-04 DIAGNOSIS — N186 End stage renal disease: Secondary | ICD-10-CM | POA: Diagnosis not present

## 2022-04-04 DIAGNOSIS — Z992 Dependence on renal dialysis: Secondary | ICD-10-CM | POA: Diagnosis not present

## 2022-04-04 LAB — POCT GLYCOSYLATED HEMOGLOBIN (HGB A1C): Hemoglobin A1C: 5.4 % (ref 4.0–5.6)

## 2022-04-04 NOTE — Patient Instructions (Signed)
Take Novolog 12 units before meals  Take 5 units for cookies

## 2022-04-04 NOTE — Progress Notes (Signed)
Patient ID: Julie Brewer, female   DOB: 29-Jan-1951, 72 y.o.   MRN: [id number]   Reason for Appointment: Follow-up of various problems  History of Present Illness    Diagnosis: Type 2 DIABETES MELITUS, date of diagnosis:  1985  Prior history: She has been on insulin since diagnosis and on Lantus previously Also at some point had been started on Glucophage several years ago also Because of insurance preference Lantus was changed to Levemir  She refuses to use analog rapid acting insulin because of cost and is using regular insulin for several years   Her blood sugars are generally well controlled and A1c usually under 7% Her A1c previously was higher with stopping metformin at 8%  Recent history:   Insulin regimen:  NOVOLOG insulin Before eating, 10 units a.m. and 12 ac supper  Oral hypoglycemic drugs: None   Current blood sugar patterns, management and problems:  Her A1c is 5.4  However fructosamine is 289, previously was at 304   A1c is usually falsely low Although she is checking her blood sugars with her libre sensor data is still very incomplete  She was prescribed the Dexcom sensor but her daughter did not follow-up on this  She is also switched to Blackey insulin from regular insulin to provide better postprandial control and avoid delayed hypoglycemia Currently she has relatively good blood sugars overall but tends to have higher readings in the afternoon likely from eating snacks like popcorn or cookies, glucose may be as high as 250 as seen on her sensor Does not usually eat lunch However not clear if blood sugars are controlled after dinner as there is less monitoring but on occasion she has had low sugars sometimes after dinner Rarely has had a reading of 58 before lunch Blood sugars may be higher after breakfast when she is eating cereal compared to oatmeal Overnight blood sugars are still fairly well-controlled without basal insulin Usually is  asymptomatic with low sugars  Her daughter is drawing up her insulin with the syringe and she prefers the insulin vials   Side effects from medications: None      Glucometer:  FreeStyle libre version 2  Blood sugar data incomplete, only 37% active time, analysis as above   AVERAGE glucose 130 with HIGHEST average 160 2 in the afternoon from 2-6 PM Lowest average 76 8-10 PM Currently 2% below 70 and 85% in range  Previous data:  CGM use % of time 44  2-week average/GV 104/39  Time in range 80      %  % Time Above 180 8  % Time above 250   % Time Below 70 12 was 26     PRE-MEAL Fasting Lunch Dinner 12 AM-2 AM Overall  Glucose range:       Averages: 86   55 104   POST-MEAL PC Breakfast PC Lunch PC Dinner  Glucose range:     Averages: 150  122     Meals:  usually 2 meals per day at 10 AM and 5 PM.     Mealtime protein sources:turkey, chicken.  Eating grits for breakfast Or oatmeal Avoiding sweet drinks  Physical activity: exercise: Some walking within the house             Wt Readings from Last 3 Encounters:  04/04/22 124 lb 6.4 oz (56.4 kg)  12/27/21 126 lb 6.4 oz (57.3 kg)  12/11/21 123 lb (55.8 kg)   Lab Results  Component Value Date  HGBA1C 5.4 04/04/2022   HGBA1C 5.7 12/18/2021   HGBA1C 4.8 05/29/2021   Lab Results  Component Value Date   MICROALBUR 196.7 (H) 03/11/2018   LDLCALC 58 05/29/2021   CREATININE 5.61 (H) 07/10/2021      Lab Results  Component Value Date   FRUCTOSAMINE 289 (H) 04/02/2022   FRUCTOSAMINE 304 (H) 12/18/2021   FRUCTOSAMINE 280 08/14/2021   FRUCTOSAMINE 282 05/29/2021    Other active problems: See review of systems      Allergies as of 04/04/2022       Reactions   Morphine And Related Other (See Comments)   Hallucinations    Penicillins Swelling, Rash   Throat swelling Did it involve swelling of the face/tongue/throat, SOB, or low BP? Yes Did it involve sudden or severe rash/hives, skin peeling, or any reaction on  the inside of your mouth or nose? Yes Did you need to seek medical attention at a hospital or doctor's office? Yes When did it last happen?   young child    If all above answers are "NO", may proceed with cephalosporin use.        Medication List        Accurate as of April 04, 2022  5:41 PM. If you have any questions, ask your nurse or doctor.          acetaminophen 325 MG tablet Commonly known as: TYLENOL Take 2 tablets (650 mg total) by mouth every 6 (six) hours as needed for mild pain (or Fever >/= 101).   amLODipine 5 MG tablet Commonly known as: NORVASC Take 5 mg by mouth daily.   aspirin EC 325 MG tablet Take 1 tablet (325 mg total) by mouth daily.   atorvastatin 20 MG tablet Commonly known as: LIPITOR Take 1 tablet by mouth once daily   bisacodyl 5 MG EC tablet Commonly known as: DULCOLAX Take 5 mg by mouth daily as needed for moderate constipation.   cilostazol 100 MG tablet Commonly known as: PLETAL Take 1 tablet by mouth twice daily   Dexcom G7 Receiver Devi To display CGM data   Dexcom G7 Sensor Misc 1 Device by Does not apply route as directed. Change sensor every 10 days   docusate sodium 100 MG capsule Commonly known as: COLACE Take 200 mg by mouth daily as needed for mild constipation.   FreeStyle Freedom Kit Use to check blood sugar once a day dx code   FREESTYLE LITE test strip Generic drug: glucose blood USE AS INSTRUCTED TO CHECK BLOOD SUGAR ONCE DAILY.   gabapentin 100 MG capsule Commonly known as: NEURONTIN TAKE 1 CAPSULE BY MOUTH TWICE DAILY AS NEEDED   insulin aspart 100 UNIT/ML injection Commonly known as: novoLOG 12 units before breakfast and 14 before supper   Insulin Pen Needle 31G X 5 MM Misc Use with pen   lidocaine-prilocaine cream Commonly known as: EMLA 1 application. daily as needed (port access).   pantoprazole 40 MG tablet Commonly known as: PROTONIX Take 1 tablet (40 mg) by mouth twice daily for 4 weeks,  then switch to 1 tablet once daily. What changed:  how much to take how to take this when to take this additional instructions   triamcinolone ointment 0.1 % Commonly known as: KENALOG 1 application  daily.   Vitamin D (Ergocalciferol) 1.25 MG (50000 UNIT) Caps capsule Commonly known as: DRISDOL Take 1 capsule by mouth once a week What changed:  how much to take how to take this when to take  this additional instructions        Allergies:  Allergies  Allergen Reactions   Morphine And Related Other (See Comments)    Hallucinations    Penicillins Swelling and Rash    Throat swelling Did it involve swelling of the face/tongue/throat, SOB, or low BP? Yes Did it involve sudden or severe rash/hives, skin peeling, or any reaction on the inside of your mouth or nose? Yes Did you need to seek medical attention at a hospital or doctor's office? Yes When did it last happen?   young child    If all above answers are "NO", may proceed with cephalosporin use.    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   CHF (congestive heart failure) (HCC)    Chronic kidney disease    Tu/Th/Sa   Diabetes mellitus    Type II per Dr [name]  (patient said type I)   Fibroid    GERD (gastroesophageal reflux disease)    Glaucoma    Hyperlipidemia    Hypertension    Iron deficiency anemia 03/09/2016   Peripheral vascular disease ([facility])    Pneumonia 2006   PONV (postoperative nausea and vomiting)    Seizure disorder (HCC)    Shortness of breath dyspnea    with exdrtion, "Walkling too fast"   Stroke ([facility])    no residual    Past Surgical History:  Procedure Laterality Date   ABDOMINAL HYSTERECTOMY     AV FISTULA PLACEMENT Left 04/27/2019   Procedure: INSERTION OF ARTERIOVENOUS (AV) GORE-TEX GRAFT LEFT UPPER ARM;  Surgeon: Angelia Mould, MD;  Location: [facility];   Service: Vascular;  Laterality: Left;   BIOPSY  06/10/2017   Procedure: BIOPSY;  Surgeon: Ronnette Juniper, MD;  Location: [facility];  Service: Gastroenterology;;   BIOPSY  04/10/2021   Procedure: BIOPSY;  Surgeon: Otis Brace, MD;  Location: [facility];  Service: Gastroenterology;;   BREAST BIOPSY Right    CERVICAL FUSION     with graft from hip   COLONOSCOPY  July 09, 2012   DIRECT LARYNGOSCOPY N/A 06/07/2014   Procedure: DIRECT LARYNGOSCOPY with BIOPSY and EXCISION VOLLECULAR CYST;  Surgeon: Ruby Cola, MD;  Location: [facility];  Service: ENT;  Laterality: N/A;   ESOPHAGOGASTRODUODENOSCOPY (EGD) WITH PROPOFOL Left 06/10/2017   Procedure: ESOPHAGOGASTRODUODENOSCOPY (EGD) WITH PROPOFOL;  Surgeon: Ronnette Juniper, MD;  Location: [facility];  Service: Gastroenterology;  Laterality: Left;   ESOPHAGOGASTRODUODENOSCOPY (EGD) WITH PROPOFOL N/A 04/10/2021   Procedure: ESOPHAGOGASTRODUODENOSCOPY (EGD) WITH PROPOFOL;  Surgeon: Otis Brace, MD;  Location: [facility];  Service: Gastroenterology;  Laterality: N/A;   FLEXIBLE SIGMOIDOSCOPY Left 06/10/2017   Procedure: FLEXIBLE SIGMOIDOSCOPY;  Surgeon: Ronnette Juniper, MD;  Location: [facility];  Service: Gastroenterology;  Laterality: Left;   HOT HEMOSTASIS N/A 04/10/2021   Procedure: HOT HEMOSTASIS (ARGON PLASMA COAGULATION/BICAP);  Surgeon: Otis Brace, MD;  Location: [facility] END[facility];  Service: Gastroenterology;  Laterality: N/A;   LOOP RECORDER INSERTION N/A 06/20/2016   Procedure: Loop Recorder Insertion;  Surgeon: [name], MD;  Location: [facility];  Service: Cardiovascular;  Laterality: N/A;   REFRACTIVE SURGERY Bilateral    both eyes   SPINE SURGERY     lumbar    Family History  Problem Relation Age of Onset   Cancer Mother    Heart disease Mother    Diabetes Father  Cancer Brother    Cancer Brother    Throat cancer Brother     Social History:  reports that she has been smoking cigarettes. She has a 30.00  pack-year smoking history. She has never used smokeless tobacco. She reports that she does not currently use alcohol. She reports that she does not currently use drugs after having used the following drugs: Methylphenidate.  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     HYPERLIPIDEMIA: The lipid abnormality consists of elevated LDL controlled with Lipitor 20 mg .   Her baseline LDL was 202  LDL is consistently well controlled and triglycerides have been normal .  Lab Results  Component Value Date   CHOL 146 05/29/2021   HDL 67.40 05/29/2021   LDLCALC 58 05/29/2021   LDLDIRECT 37.0 04/02/2022   TRIG 100.0 05/29/2021   CHOLHDL 2 05/29/2021       Last diabetic foot exam was done in 8/22 by podiatrist She has some vascular disease on the left side followed by vascular specialist  Has been taking gabapentin '100mg'$  twice daily for lower leg leg pains with relief Has numbness in feet   She has had her Covid vaccines   Examination:   BP 114/60 (BP Location: Left Arm, Patient Position: Sitting, Cuff Size: Normal)   Pulse 60   Ht '5\' 3"'$  (1.6 m)   Wt 124 lb 6.4 oz (56.4 kg)   SpO2 98%   BMI 22.04 kg/m   Body mass index is 22.04 kg/m.    ASSESSMENT/ PLAN:     Diabetes type 2 on insulin See history of present illness for detailed discussion of current diabetes management, blood sugar patterns and problems identified  Her A1c is falsely low as usual  She is on mealtime insulin with NOVOLOG insulin as only treatment  Her A1c may not be accurate because of anemia and fructosamine is upper normal at 289  Blood sugars are sporadically higher after breakfast or midday snack but relatively lower after  dinner as judged from her freestyle libre analysis as above   Recommendations: Her daughter will need to ask for the Dexcom G7 from the [facility] and phone number given Reduce suppertime dose to 12 units also In the meantime needs to check readings at bedtime consistently Add protein for breakfast when eating cereal She will add 5 units of NovoLog at least on weekends when she is eating a large snack midday such as cookies, weekdays may be difficult because she is alone   Lipids: Well-controlled with LDL 37  Patient Instructions  Take Novolog 12 units before meals  Take 5 units for cookies  Elayne Snare 04/04/2022, 5:41 PM     Note: This office note was prepared with Dragon voice recognition system technology. Any transcriptional errors that result from this process are unintentional.

## 2022-04-06 DIAGNOSIS — Z992 Dependence on renal dialysis: Secondary | ICD-10-CM | POA: Diagnosis not present

## 2022-04-06 DIAGNOSIS — N2581 Secondary hyperparathyroidism of renal origin: Secondary | ICD-10-CM | POA: Diagnosis not present

## 2022-04-06 DIAGNOSIS — N186 End stage renal disease: Secondary | ICD-10-CM | POA: Diagnosis not present

## 2022-04-06 DIAGNOSIS — D631 Anemia in chronic kidney disease: Secondary | ICD-10-CM | POA: Diagnosis not present

## 2022-04-09 DIAGNOSIS — N2581 Secondary hyperparathyroidism of renal origin: Secondary | ICD-10-CM | POA: Diagnosis not present

## 2022-04-09 DIAGNOSIS — Z992 Dependence on renal dialysis: Secondary | ICD-10-CM | POA: Diagnosis not present

## 2022-04-09 DIAGNOSIS — N186 End stage renal disease: Secondary | ICD-10-CM | POA: Diagnosis not present

## 2022-04-09 DIAGNOSIS — D631 Anemia in chronic kidney disease: Secondary | ICD-10-CM | POA: Diagnosis not present

## 2022-04-10 ENCOUNTER — Telehealth: Payer: Self-pay | Admitting: Endocrinology

## 2022-04-10 ENCOUNTER — Ambulatory Visit (INDEPENDENT_AMBULATORY_CARE_PROVIDER_SITE_OTHER): Payer: Medicare Other | Admitting: Podiatry

## 2022-04-10 DIAGNOSIS — M79675 Pain in left toe(s): Secondary | ICD-10-CM

## 2022-04-10 DIAGNOSIS — M79674 Pain in right toe(s): Secondary | ICD-10-CM

## 2022-04-10 DIAGNOSIS — E1151 Type 2 diabetes mellitus with diabetic peripheral angiopathy without gangrene: Secondary | ICD-10-CM

## 2022-04-10 DIAGNOSIS — B351 Tinea unguium: Secondary | ICD-10-CM | POA: Diagnosis not present

## 2022-04-10 NOTE — Telephone Encounter (Signed)
Patient's daughter brought by form for therapeutic shoes to be completed.  Form is in Dr. Ronnie Derby folder in the front office.

## 2022-04-10 NOTE — Progress Notes (Signed)
  Subjective:  Patient ID: Julie Brewer, female    DOB: November 20, 1950,  MRN: [id number]  Chief Complaint  Patient presents with   Nail Problem   72 y.o. female returns for the above complaint.  Patient is a type II diabetic whose A1c is 6.1.  Patient presents with thickened elongated mycotic toenails x10.  Patient states that she is not able to cut it herself.  She would like to bring [place] to debride them down.  She does not have any secondary complaints Objective:   There were no vitals filed for this visit.  Podiatric Exam: Vascular: dorsalis pedis and posterior tibial pulses are palpable bilateral. Capillary return is immediate. Temperature gradient is WNL. Skin turgor WNL  Sensorium: Normal Semmes Weinstein monofilament test. Normal tactile sensation bilaterally. Nail Exam: Pt has thick disfigured discolored nails with subungual debris noted bilateral entire nail hallux through fifth toenails Ulcer Exam: There is no evidence of ulcer or pre-ulcerative changes or infection. Orthopedic Exam: Muscle tone and strength are WNL. No limitations in general ROM. No crepitus or effusions noted. HAV  B/L.  Hammer toes 2-5  B/L. Skin: No Porokeratosis. No infection or ulcers.  Submet 3 hyperkeratotic lesion/callus x2.  Pain on palpation.  Assessment & Plan:  Patient was evaluated and treated and all questions answered.  Hammertoe contractures bilateral two through five -Clinically doing well and for management has been fine without ulcers -Awaiting diabetic shoes  Onychomycosis with pain  -Nails palliatively debrided as below. -Educated on self-care  Procedure: Nail Debridement Rationale: pain  Type of Debridement: manual, sharp debridement. Instrumentation: Nail nipper, rotary burr. Number of Nails: 10  Procedures and Treatment: Consent by patient was obtained for treatment procedures. The patient understood the discussion of treatment and procedures well. All questions were answered  thoroughly reviewed. Debridement of mycotic and hypertrophic toenails, 1 through 5 bilateral and clearing of subungual debris. No ulceration, no infection noted.  Return Visit-Office Procedure: Patient instructed to return to the office for a follow up visit 3 months for continued evaluation and treatment.  Boneta Lucks, DPM    No follow-ups on file.  Toenails x10.  Hammertoe bilateral diabetic shoes

## 2022-04-11 DIAGNOSIS — Z992 Dependence on renal dialysis: Secondary | ICD-10-CM | POA: Diagnosis not present

## 2022-04-11 DIAGNOSIS — N2581 Secondary hyperparathyroidism of renal origin: Secondary | ICD-10-CM | POA: Diagnosis not present

## 2022-04-11 DIAGNOSIS — D631 Anemia in chronic kidney disease: Secondary | ICD-10-CM | POA: Diagnosis not present

## 2022-04-11 DIAGNOSIS — N186 End stage renal disease: Secondary | ICD-10-CM | POA: Diagnosis not present

## 2022-04-13 DIAGNOSIS — N2581 Secondary hyperparathyroidism of renal origin: Secondary | ICD-10-CM | POA: Diagnosis not present

## 2022-04-13 DIAGNOSIS — N186 End stage renal disease: Secondary | ICD-10-CM | POA: Diagnosis not present

## 2022-04-13 DIAGNOSIS — D631 Anemia in chronic kidney disease: Secondary | ICD-10-CM | POA: Diagnosis not present

## 2022-04-13 DIAGNOSIS — Z992 Dependence on renal dialysis: Secondary | ICD-10-CM | POA: Diagnosis not present

## 2022-04-16 DIAGNOSIS — N186 End stage renal disease: Secondary | ICD-10-CM | POA: Diagnosis not present

## 2022-04-16 DIAGNOSIS — N2581 Secondary hyperparathyroidism of renal origin: Secondary | ICD-10-CM | POA: Diagnosis not present

## 2022-04-16 DIAGNOSIS — D631 Anemia in chronic kidney disease: Secondary | ICD-10-CM | POA: Diagnosis not present

## 2022-04-16 DIAGNOSIS — Z992 Dependence on renal dialysis: Secondary | ICD-10-CM | POA: Diagnosis not present

## 2022-04-18 ENCOUNTER — Encounter: Payer: Self-pay | Admitting: Endocrinology

## 2022-04-18 DIAGNOSIS — N2581 Secondary hyperparathyroidism of renal origin: Secondary | ICD-10-CM | POA: Diagnosis not present

## 2022-04-18 DIAGNOSIS — D631 Anemia in chronic kidney disease: Secondary | ICD-10-CM | POA: Diagnosis not present

## 2022-04-18 DIAGNOSIS — N186 End stage renal disease: Secondary | ICD-10-CM | POA: Diagnosis not present

## 2022-04-18 DIAGNOSIS — Z992 Dependence on renal dialysis: Secondary | ICD-10-CM | POA: Diagnosis not present

## 2022-04-20 DIAGNOSIS — D631 Anemia in chronic kidney disease: Secondary | ICD-10-CM | POA: Diagnosis not present

## 2022-04-20 DIAGNOSIS — N186 End stage renal disease: Secondary | ICD-10-CM | POA: Diagnosis not present

## 2022-04-20 DIAGNOSIS — Z992 Dependence on renal dialysis: Secondary | ICD-10-CM | POA: Diagnosis not present

## 2022-04-20 DIAGNOSIS — N2581 Secondary hyperparathyroidism of renal origin: Secondary | ICD-10-CM | POA: Diagnosis not present

## 2022-04-23 DIAGNOSIS — Z992 Dependence on renal dialysis: Secondary | ICD-10-CM | POA: Diagnosis not present

## 2022-04-23 DIAGNOSIS — D631 Anemia in chronic kidney disease: Secondary | ICD-10-CM | POA: Diagnosis not present

## 2022-04-23 DIAGNOSIS — N186 End stage renal disease: Secondary | ICD-10-CM | POA: Diagnosis not present

## 2022-04-23 DIAGNOSIS — N2581 Secondary hyperparathyroidism of renal origin: Secondary | ICD-10-CM | POA: Diagnosis not present

## 2022-04-25 DIAGNOSIS — N2581 Secondary hyperparathyroidism of renal origin: Secondary | ICD-10-CM | POA: Diagnosis not present

## 2022-04-25 DIAGNOSIS — N186 End stage renal disease: Secondary | ICD-10-CM | POA: Diagnosis not present

## 2022-04-25 DIAGNOSIS — Z992 Dependence on renal dialysis: Secondary | ICD-10-CM | POA: Diagnosis not present

## 2022-04-25 DIAGNOSIS — D631 Anemia in chronic kidney disease: Secondary | ICD-10-CM | POA: Diagnosis not present

## 2022-04-26 ENCOUNTER — Other Ambulatory Visit: Payer: Self-pay

## 2022-04-26 MED ORDER — CILOSTAZOL 100 MG PO TABS: 100.0000 mg | ORAL_TABLET | Freq: Two times a day (BID) | ORAL | 3 refills | Status: AC

## 2022-04-27 DIAGNOSIS — D631 Anemia in chronic kidney disease: Secondary | ICD-10-CM | POA: Diagnosis not present

## 2022-04-27 DIAGNOSIS — N2581 Secondary hyperparathyroidism of renal origin: Secondary | ICD-10-CM | POA: Diagnosis not present

## 2022-04-27 DIAGNOSIS — N186 End stage renal disease: Secondary | ICD-10-CM | POA: Diagnosis not present

## 2022-04-27 DIAGNOSIS — Z992 Dependence on renal dialysis: Secondary | ICD-10-CM | POA: Diagnosis not present

## 2022-04-30 DIAGNOSIS — E1122 Type 2 diabetes mellitus with diabetic chronic kidney disease: Secondary | ICD-10-CM | POA: Diagnosis not present

## 2022-04-30 DIAGNOSIS — N186 End stage renal disease: Secondary | ICD-10-CM | POA: Diagnosis not present

## 2022-04-30 DIAGNOSIS — Z992 Dependence on renal dialysis: Secondary | ICD-10-CM | POA: Diagnosis not present

## 2022-05-01 DIAGNOSIS — N2581 Secondary hyperparathyroidism of renal origin: Secondary | ICD-10-CM | POA: Diagnosis not present

## 2022-05-01 DIAGNOSIS — N186 End stage renal disease: Secondary | ICD-10-CM | POA: Diagnosis not present

## 2022-05-01 DIAGNOSIS — Z992 Dependence on renal dialysis: Secondary | ICD-10-CM | POA: Diagnosis not present

## 2022-05-03 DIAGNOSIS — Z992 Dependence on renal dialysis: Secondary | ICD-10-CM | POA: Diagnosis not present

## 2022-05-03 DIAGNOSIS — N2581 Secondary hyperparathyroidism of renal origin: Secondary | ICD-10-CM | POA: Diagnosis not present

## 2022-05-03 DIAGNOSIS — N186 End stage renal disease: Secondary | ICD-10-CM | POA: Diagnosis not present

## 2022-05-05 DIAGNOSIS — Z992 Dependence on renal dialysis: Secondary | ICD-10-CM | POA: Diagnosis not present

## 2022-05-05 DIAGNOSIS — N2581 Secondary hyperparathyroidism of renal origin: Secondary | ICD-10-CM | POA: Diagnosis not present

## 2022-05-05 DIAGNOSIS — N186 End stage renal disease: Secondary | ICD-10-CM | POA: Diagnosis not present

## 2022-05-08 DIAGNOSIS — N186 End stage renal disease: Secondary | ICD-10-CM | POA: Diagnosis not present

## 2022-05-08 DIAGNOSIS — Z992 Dependence on renal dialysis: Secondary | ICD-10-CM | POA: Diagnosis not present

## 2022-05-08 DIAGNOSIS — N2581 Secondary hyperparathyroidism of renal origin: Secondary | ICD-10-CM | POA: Diagnosis not present

## 2022-05-11 DIAGNOSIS — E1122 Type 2 diabetes mellitus with diabetic chronic kidney disease: Secondary | ICD-10-CM | POA: Diagnosis not present

## 2022-05-11 DIAGNOSIS — N186 End stage renal disease: Secondary | ICD-10-CM | POA: Diagnosis not present

## 2022-05-11 DIAGNOSIS — N2581 Secondary hyperparathyroidism of renal origin: Secondary | ICD-10-CM | POA: Diagnosis not present

## 2022-05-11 DIAGNOSIS — Z992 Dependence on renal dialysis: Secondary | ICD-10-CM | POA: Diagnosis not present

## 2022-05-11 DIAGNOSIS — D631 Anemia in chronic kidney disease: Secondary | ICD-10-CM | POA: Diagnosis not present

## 2022-05-14 DIAGNOSIS — N186 End stage renal disease: Secondary | ICD-10-CM | POA: Diagnosis not present

## 2022-05-14 DIAGNOSIS — Z992 Dependence on renal dialysis: Secondary | ICD-10-CM | POA: Diagnosis not present

## 2022-05-14 DIAGNOSIS — N2581 Secondary hyperparathyroidism of renal origin: Secondary | ICD-10-CM | POA: Diagnosis not present

## 2022-05-14 DIAGNOSIS — D631 Anemia in chronic kidney disease: Secondary | ICD-10-CM | POA: Diagnosis not present

## 2022-05-16 DIAGNOSIS — N2581 Secondary hyperparathyroidism of renal origin: Secondary | ICD-10-CM | POA: Diagnosis not present

## 2022-05-16 DIAGNOSIS — D631 Anemia in chronic kidney disease: Secondary | ICD-10-CM | POA: Diagnosis not present

## 2022-05-16 DIAGNOSIS — N186 End stage renal disease: Secondary | ICD-10-CM | POA: Diagnosis not present

## 2022-05-16 DIAGNOSIS — Z992 Dependence on renal dialysis: Secondary | ICD-10-CM | POA: Diagnosis not present

## 2022-05-18 DIAGNOSIS — D631 Anemia in chronic kidney disease: Secondary | ICD-10-CM | POA: Diagnosis not present

## 2022-05-18 DIAGNOSIS — N186 End stage renal disease: Secondary | ICD-10-CM | POA: Diagnosis not present

## 2022-05-18 DIAGNOSIS — N2581 Secondary hyperparathyroidism of renal origin: Secondary | ICD-10-CM | POA: Diagnosis not present

## 2022-05-18 DIAGNOSIS — Z992 Dependence on renal dialysis: Secondary | ICD-10-CM | POA: Diagnosis not present

## 2022-05-21 DIAGNOSIS — N2581 Secondary hyperparathyroidism of renal origin: Secondary | ICD-10-CM | POA: Diagnosis not present

## 2022-05-21 DIAGNOSIS — D631 Anemia in chronic kidney disease: Secondary | ICD-10-CM | POA: Diagnosis not present

## 2022-05-21 DIAGNOSIS — N186 End stage renal disease: Secondary | ICD-10-CM | POA: Diagnosis not present

## 2022-05-21 DIAGNOSIS — Z992 Dependence on renal dialysis: Secondary | ICD-10-CM | POA: Diagnosis not present

## 2022-05-23 DIAGNOSIS — Z992 Dependence on renal dialysis: Secondary | ICD-10-CM | POA: Diagnosis not present

## 2022-05-23 DIAGNOSIS — N186 End stage renal disease: Secondary | ICD-10-CM | POA: Diagnosis not present

## 2022-05-23 DIAGNOSIS — N2581 Secondary hyperparathyroidism of renal origin: Secondary | ICD-10-CM | POA: Diagnosis not present

## 2022-05-23 DIAGNOSIS — D631 Anemia in chronic kidney disease: Secondary | ICD-10-CM | POA: Diagnosis not present

## 2022-05-23 IMAGING — CR DG CHEST 2V
2 series · 2 of 2 positions shown · non-contrast
Comparison: 02/18/2019

CLINICAL DATA: Left-sided chest pain for 3 days, seizure

EXAM:
CHEST - 2 VIEW

[chest lat]
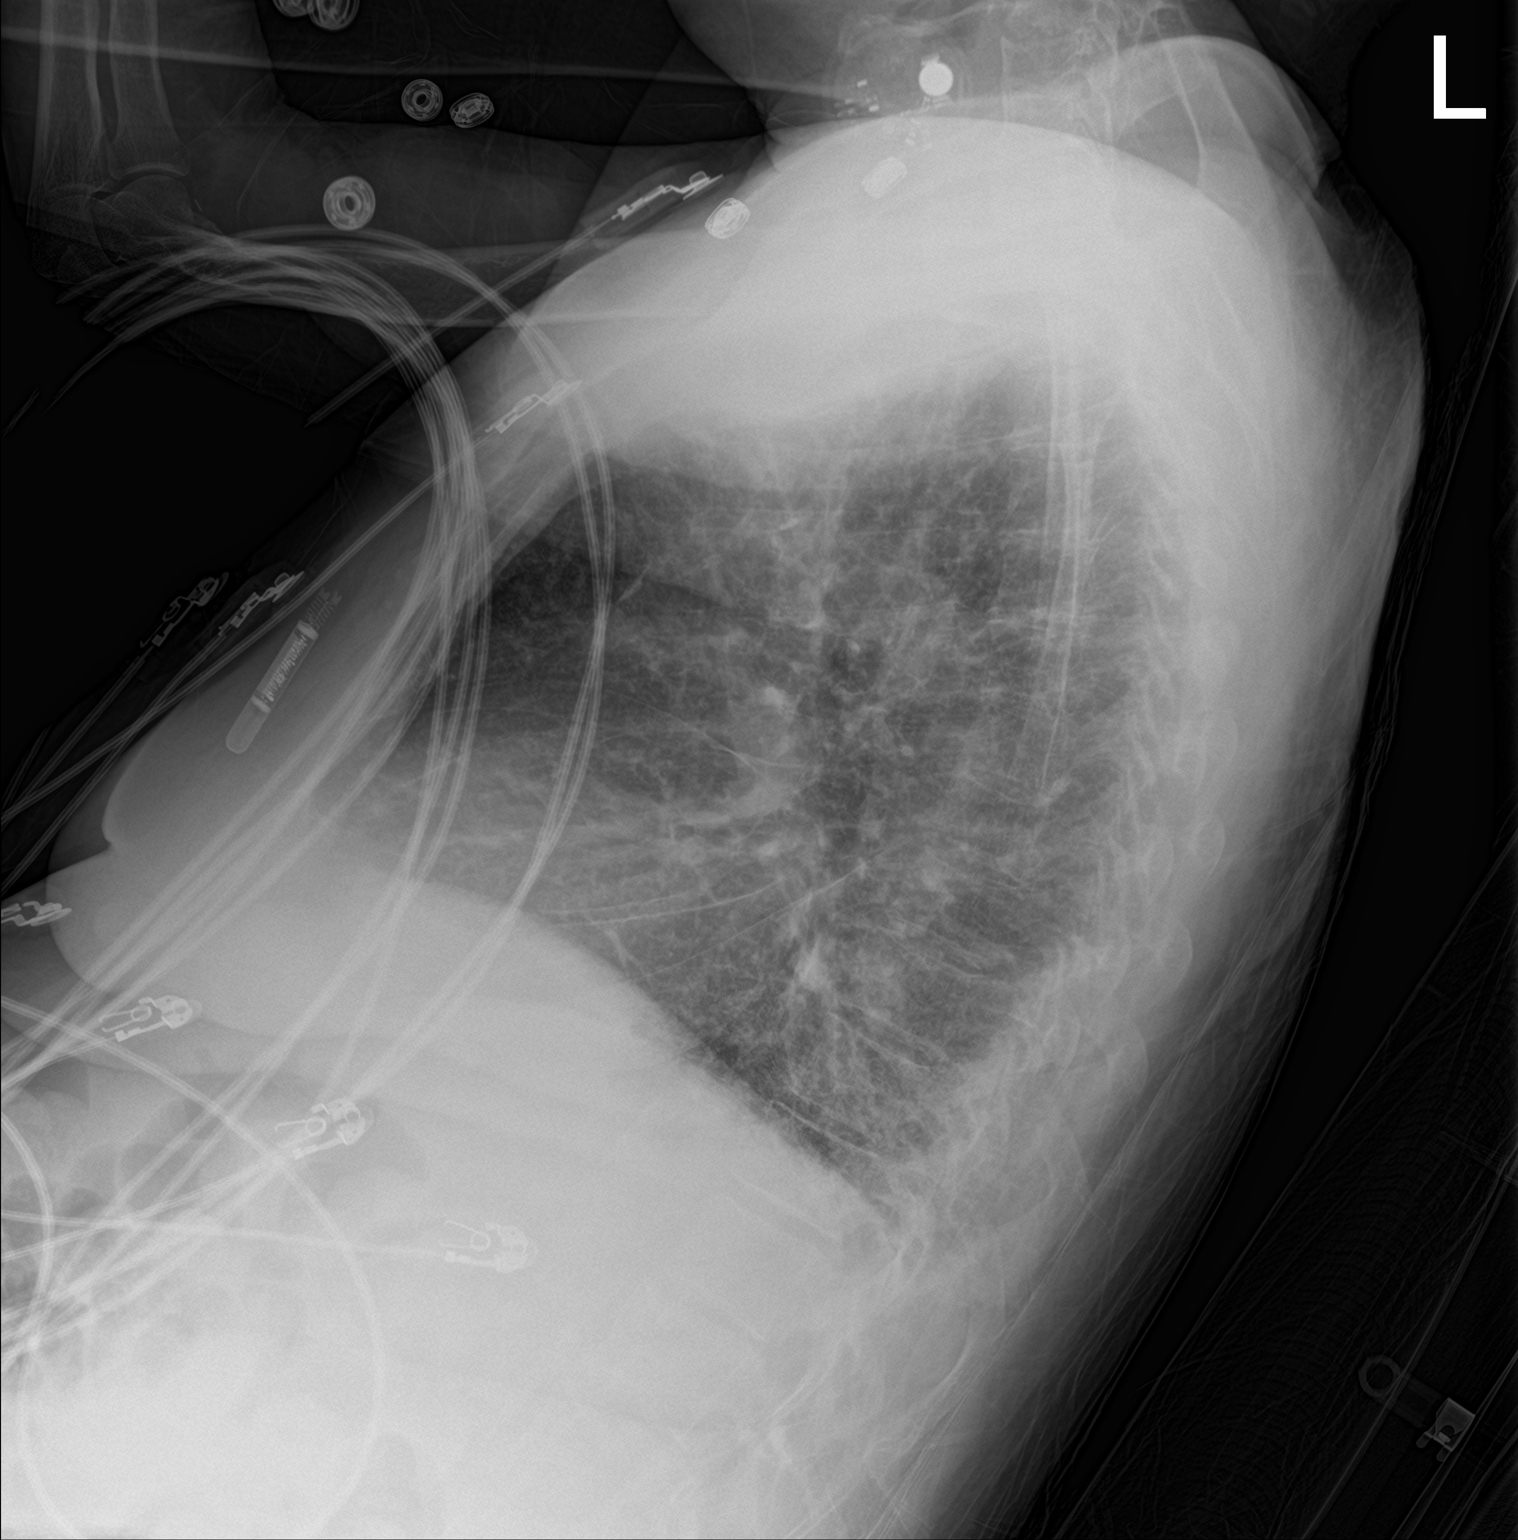

[chest ap]
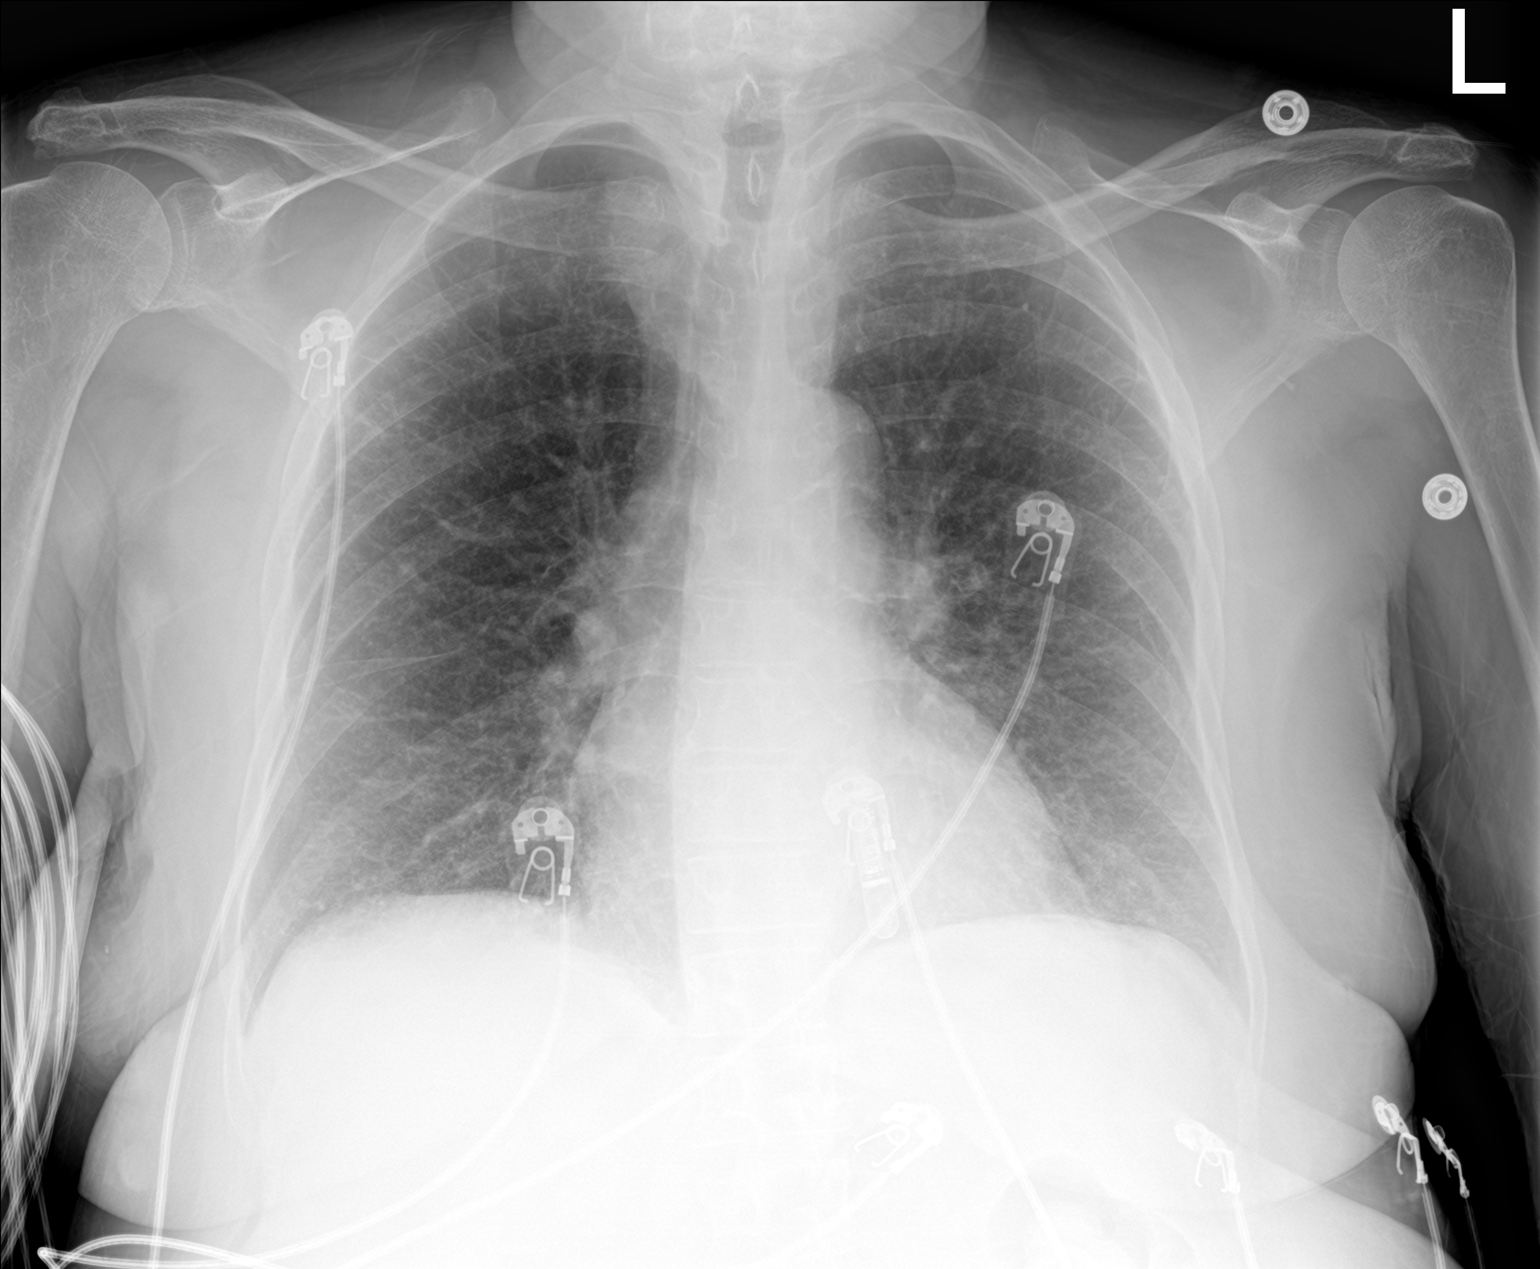

[2 of 2 positions shown; findings below may reference images not displayed]

FINDINGS: Frontal and lateral views of the chest demonstrate an unremarkable
cardiac silhouette. No airspace disease, effusion, or pneumothorax.
Chronic interstitial scarring unchanged. No acute bony
abnormalities.
IMPRESSION: 1. Stable chest, no acute process.

## 2022-05-23 IMAGING — CT CT ANGIO CHEST-ABD-PELV FOR DISSECTION W/ AND WO/W CM
2 of 7 series · 14 of 46 positions shown, 16 images · non-contrast
Comparison: Chest radiograph dated 04/24/2021.

CLINICAL DATA: Acute aortic syndrome suspected.

EXAM:
CT ANGIOGRAPHY CHEST, ABDOMEN AND PELVIS
TECHNIQUE: Non-contrast CT of the chest was initially obtained.

[Series 6: dissection 3.0 i30f 3 · axial · 0.81mm/px · z∈[+700,+1212]mm · 11 of 195 slices shown, 13 images]
[im 12/195  soft-tissue]
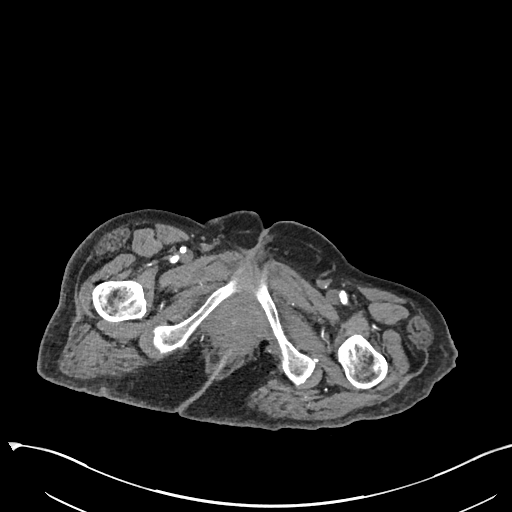
[im 12/195  bone]
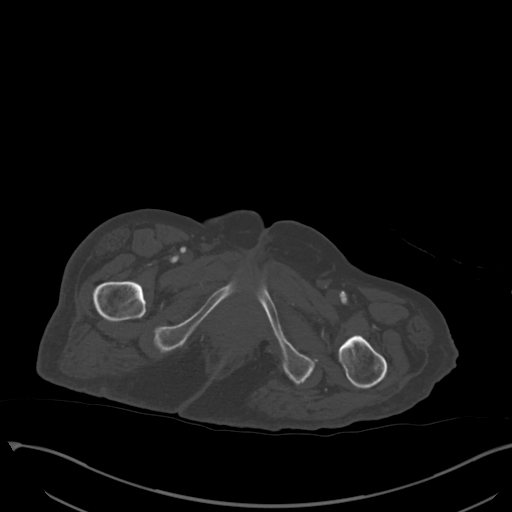
[im 35/195  soft-tissue]
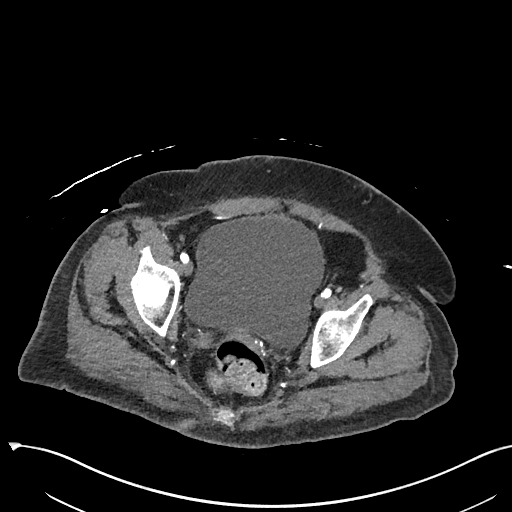
[im 46/195  soft-tissue]
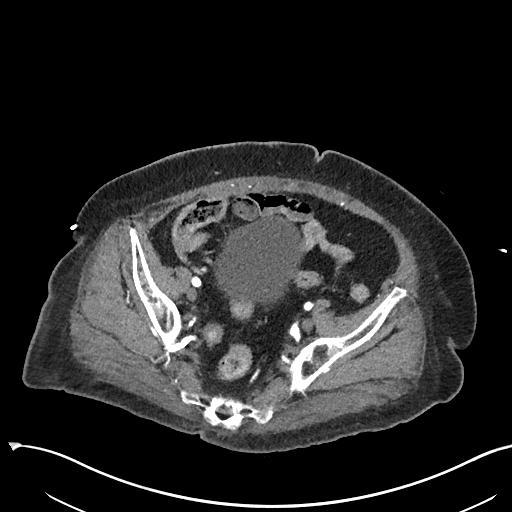
[im 69/195  soft-tissue]
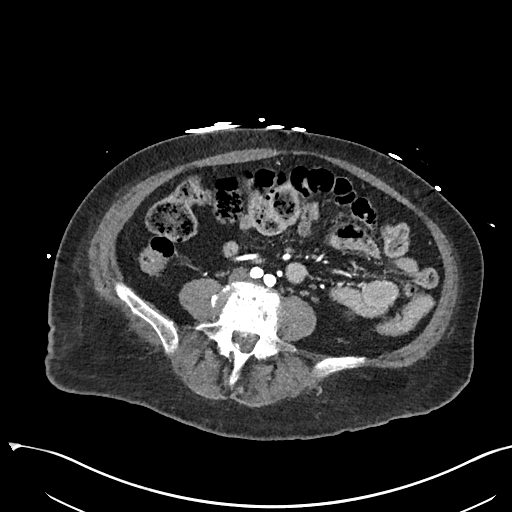
[im 80/195  soft-tissue]
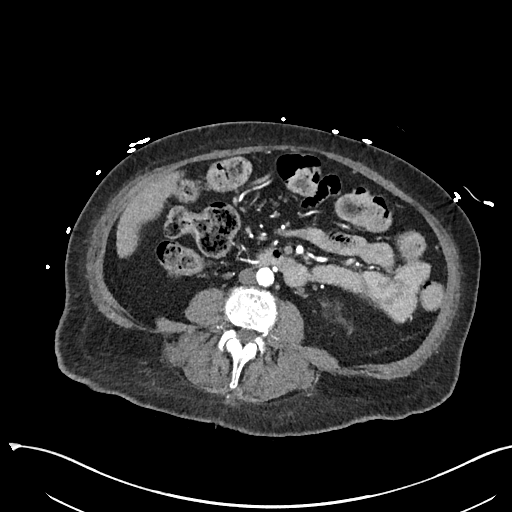
[im 103/195  soft-tissue]
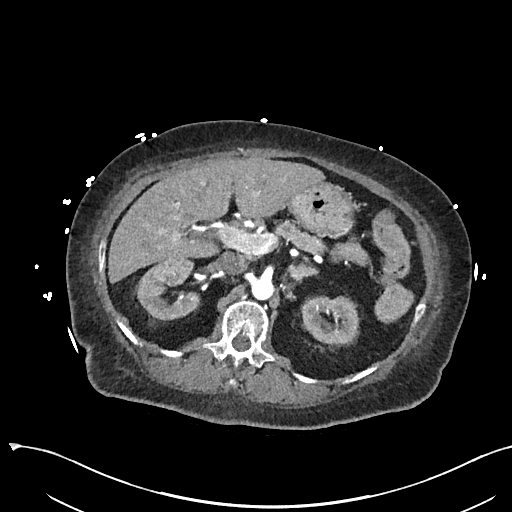
[im 115/195  soft-tissue]
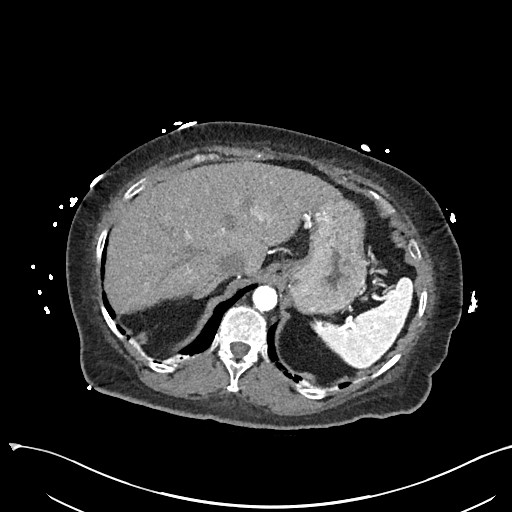
[im 126/195  soft-tissue]
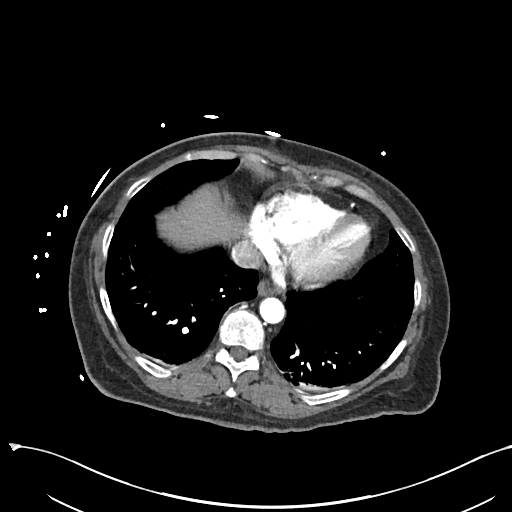
[im 149/195  soft-tissue]
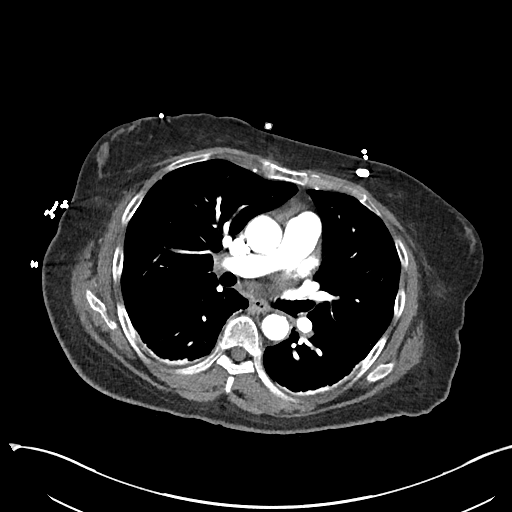
[im 149/195  bone]
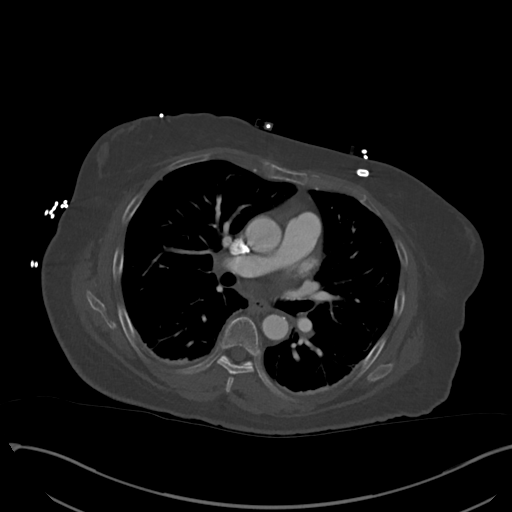
[im 160/195  soft-tissue]
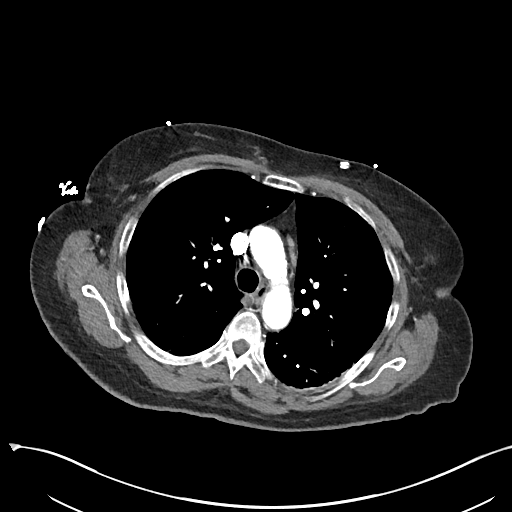
[im 183/195  soft-tissue]
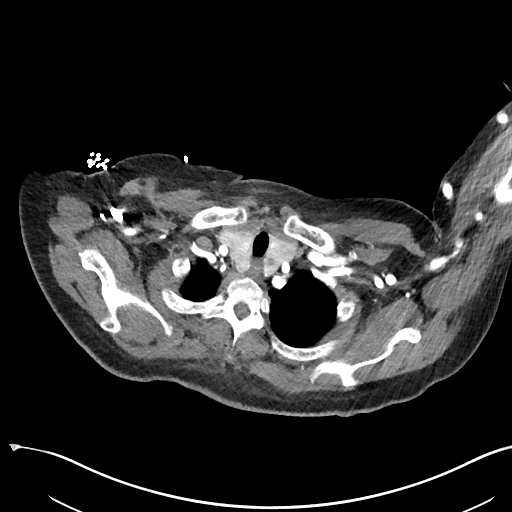

[Series 9: coronals · coronal · 0.85mm/px · 3 of 135 slices shown]
[im 34/135  soft-tissue]
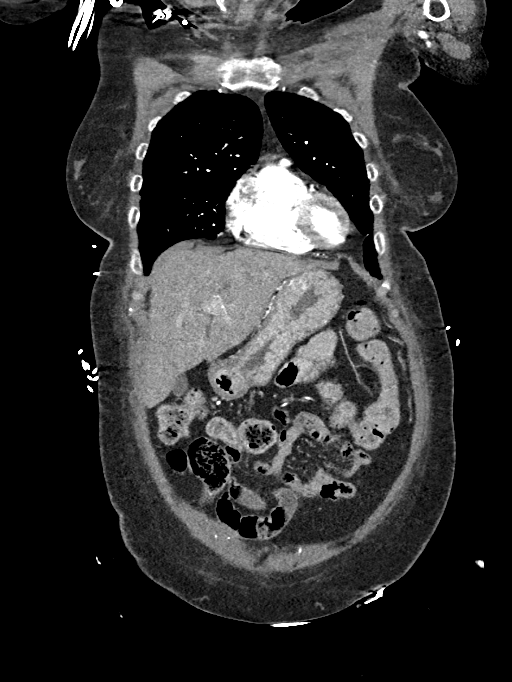
[im 68/135  soft-tissue]
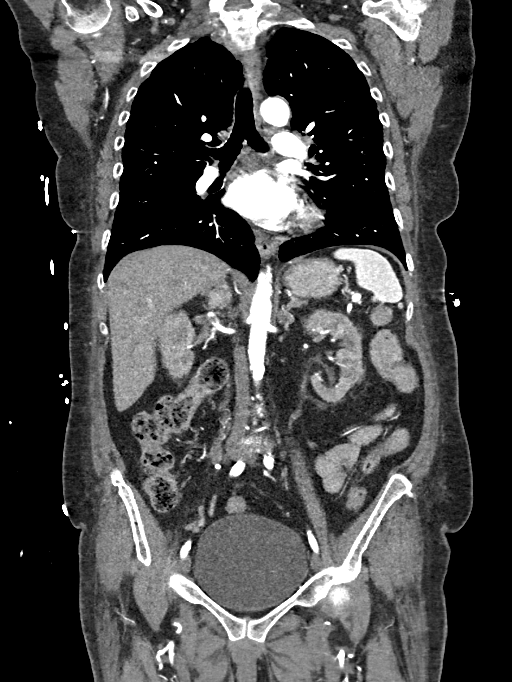
[im 101/135  soft-tissue]
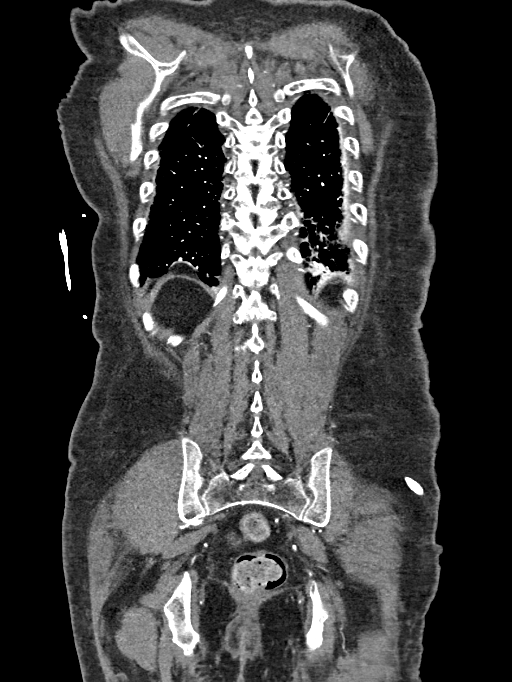

[14 of 46 positions shown; findings below may reference images not displayed]

Multidetector CT imaging through the chest, abdomen and pelvis was
performed using the standard protocol during bolus administration of
intravenous contrast. Multiplanar reconstructed images and MIPs were
obtained and reviewed to evaluate the vascular anatomy.

RADIATION DOSE REDUCTION: This exam was performed according to the
departmental dose-optimization program which includes automated
exposure control, adjustment of the mA and/or kV according to
patient size and/or use of iterative reconstruction technique.

CONTRAST:  100mL OMNIPAQUE IOHEXOL 350 MG/ML SOLN
FINDINGS: CTA CHEST FINDINGS

Cardiovascular: There is no cardiomegaly or pericardial effusion.
Mild atherosclerotic calcification of the thoracic aorta. No
aneurysmal dilatation or dissection. The origins of the great
vessels of the aortic arch appear patent as visualized. No pulmonary
artery embolus identified.

Mediastinum/Nodes: No hilar or mediastinal adenopathy. The esophagus
is grossly unremarkable. No mediastinal fluid collection.

Lungs/Pleura: Background of centrilobular and paraseptal emphysema.
Minimal bibasilar dependent atelectasis. No focal consolidation,
pleural effusion, or pneumothorax. The central airways are patent.

Musculoskeletal: No acute osseous pathology.

Review of the MIP images confirms the above findings.

CTA ABDOMEN AND PELVIS FINDINGS

VASCULAR

Aorta: Mild atherosclerotic calcification. No aneurysmal dilatation
or dissection. No periaortic fluid collection.

Celiac: Patent without evidence of aneurysm, dissection, vasculitis
or significant stenosis.

SMA: Patent without evidence of aneurysm, dissection, vasculitis or
significant stenosis.

Renals: Both renal arteries are patent without evidence of aneurysm,
dissection, vasculitis, fibromuscular dysplasia or significant
stenosis.

IMA: Patent without evidence of aneurysm, dissection, vasculitis or
significant stenosis.

Inflow: Atherosclerotic calcification of the iliac arteries. No
aneurysmal dilatation or dissection. The iliac arteries are patent.

Veins: No obvious venous abnormality within the limitations of this
arterial phase study.

Review of the MIP images confirms the above findings.

NON-VASCULAR

No intra-abdominal free air or free fluid.

Hepatobiliary: The liver is unremarkable. No intrahepatic biliary
dilatation. Multiple gallstones. No evidence of acute cholecystitis
by CT.

Pancreas: A 1 cm enhancing lesion in the tail of the pancreas
demonstrates similar enhancement as the spleen, likely a small
splenule. This was likely present on the CT of 06/09/2017 but less
conspicuous on that exam. MRI may provide better characterization.
No dilatation of the main pancreatic duct or gland atrophy. No
active inflammatory changes.

Spleen: Normal in size without focal abnormality.

Adrenals/Urinary Tract: The adrenal glands are unremarkable. Mild
bilateral renal parenchyma atrophy. A 1 cm indeterminate left renal
upper pole high attenuating lesion, not characterized. Further
evaluation with ultrasound on a nonemergent/outpatient basis
recommended. There is no hydronephrosis on either side. The
visualized ureters and urinary bladder appear unremarkable.

Stomach/Bowel: There is no bowel obstruction or active inflammation.
The appendix is normal.

Lymphatic: No adenopathy.

Reproductive: Hysterectomy.  No adnexal masses.

Other: None

Musculoskeletal: Osteopenia with degenerative changes of the spine.
No acute osseous pathology.

Review of the MIP images confirms the above findings.
IMPRESSION: 1. No acute intrathoracic, abdominal, or pelvic pathology. No aortic
aneurysm or dissection.
2. Cholelithiasis.
3. A 1 cm indeterminate left renal upper pole high attenuating
lesion, not characterized. Further characterization with ultrasound
on a nonemergent/outpatient basis recommended.
4. Probable small splenule in the pancreatic tail. This can be
better evaluated with MRI, on a nonemergent/outpatient basis, if
clinically indicated.
5. Aortic Atherosclerosis (2A0IY-VAV.V) and Emphysema (2A0IY-I18.P).

## 2022-05-23 IMAGING — CT CT HEAD W/O CM
4 series · 16 of 47 positions shown, 18 images · non-contrast
Comparison: 04/07/2021

CLINICAL DATA: Mental status changes of unknown cause



[Series 3: head wo · axial · 0.42mm/px · z∈[+1276,+1396]mm · 7 of 33 slices shown, 9 images]
[im 5/33  brain]
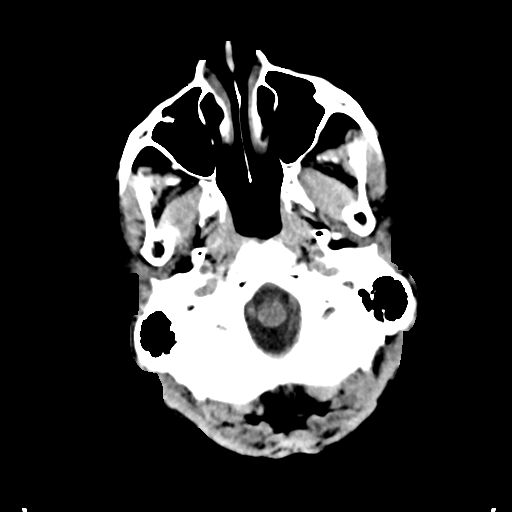
[im 5/33  bone]
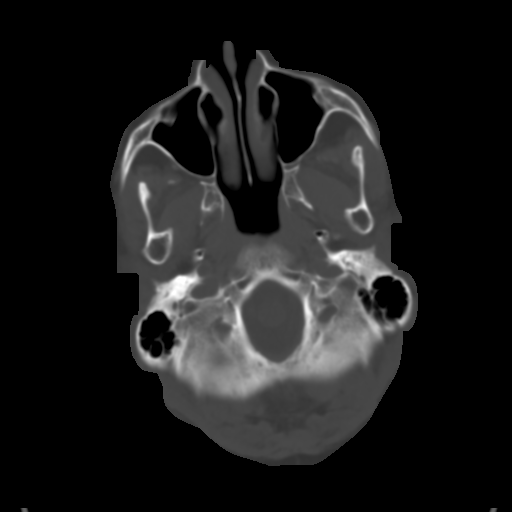
[im 9/33  brain]
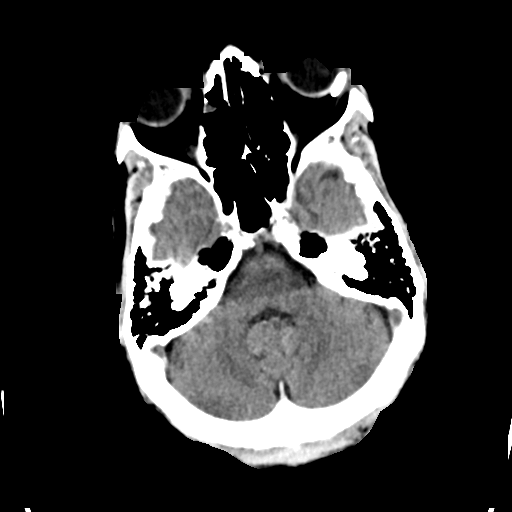
[im 13/33  brain]
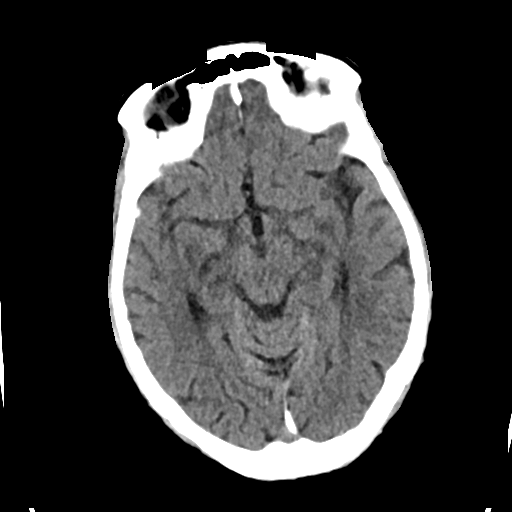
[im 17/33  brain]
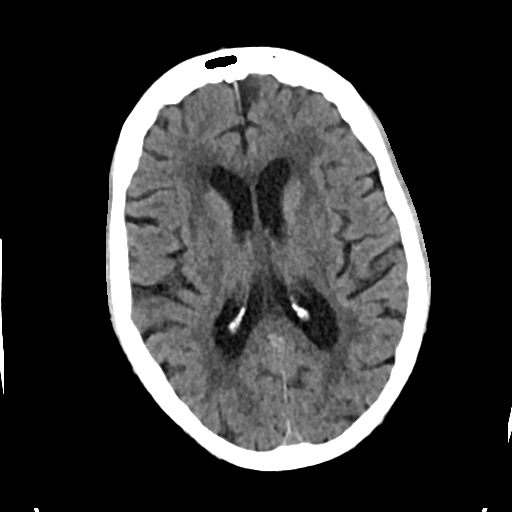
[im 21/33  brain]
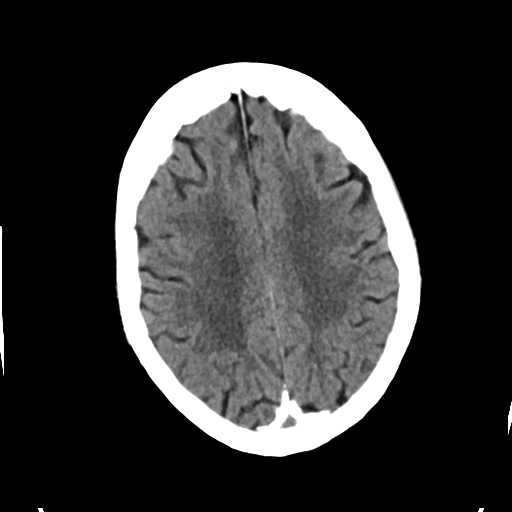
[im 21/33  bone]
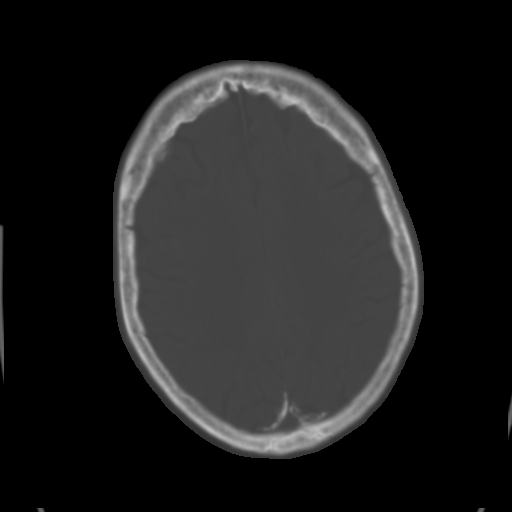
[im 25/33  brain]
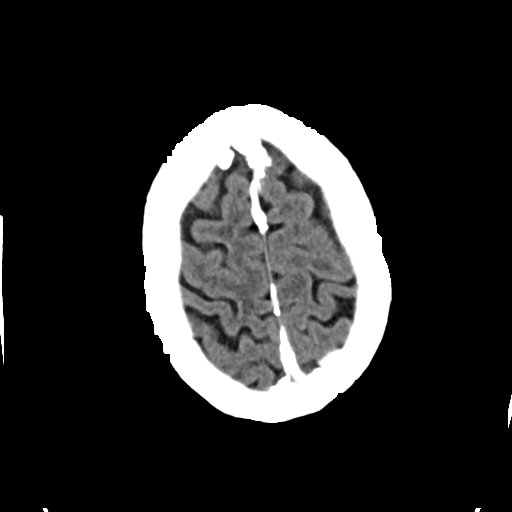
[im 29/33  brain]
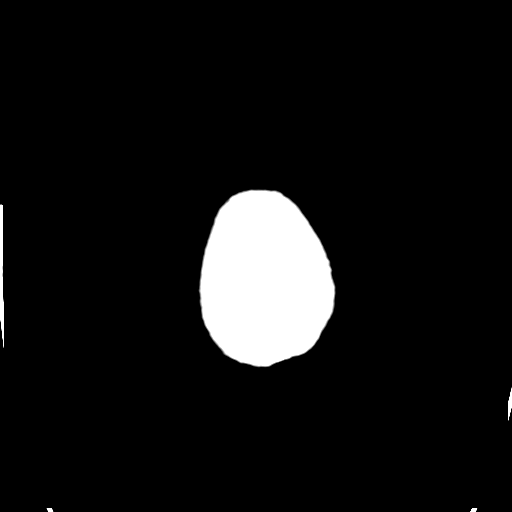

[Series 4: head bone · axial · 0.42mm/px · z∈[+1272,+1304]mm · 3 of 82 slices shown]
[im 9/82  bone]
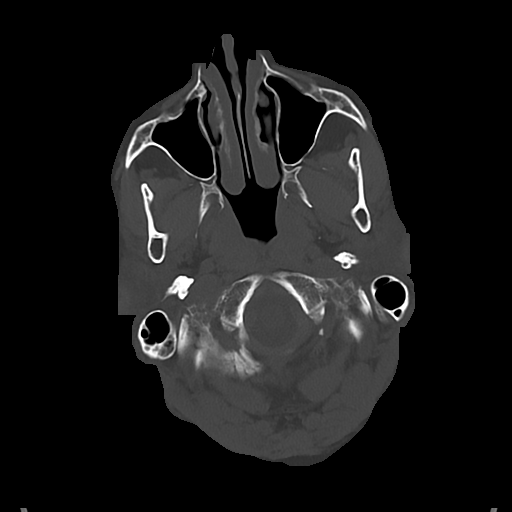
[im 17/82  bone]
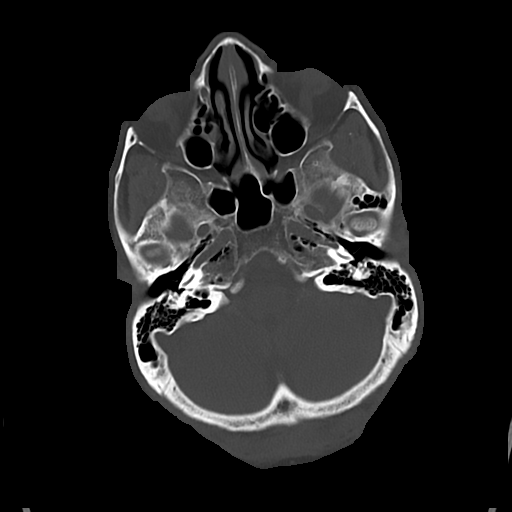
[im 25/82  bone]
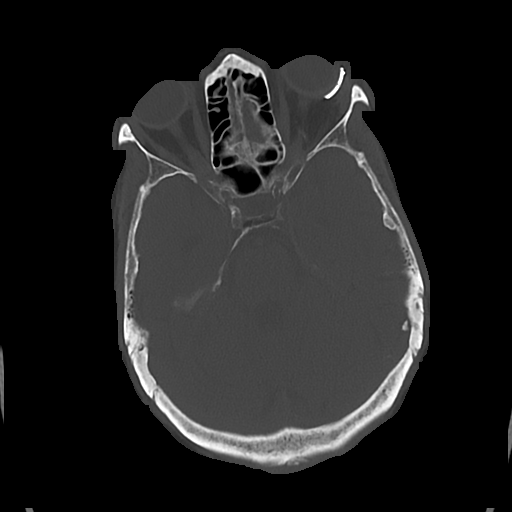

[Series 5: cor soft · coronal · 0.29mm/px · 3 of 70 slices shown]
[im 24/70  brain]
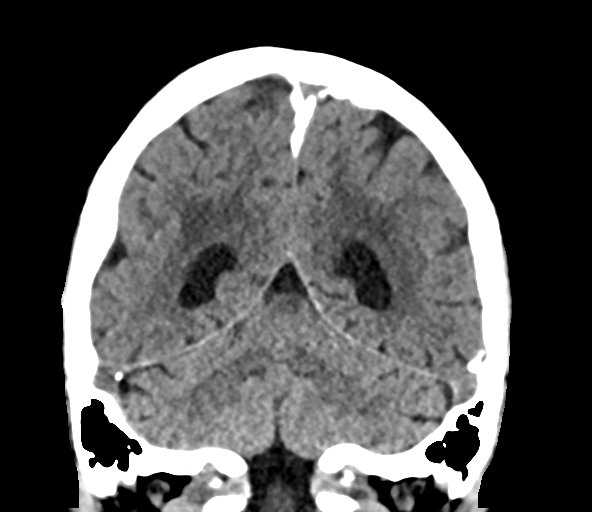
[im 31/70  brain]
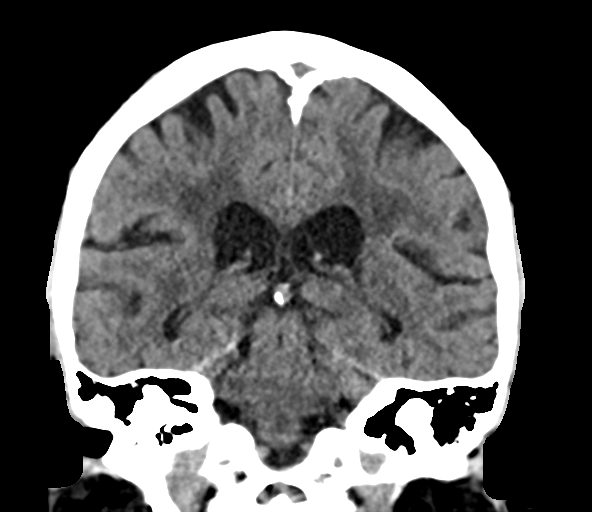
[im 39/70  brain]
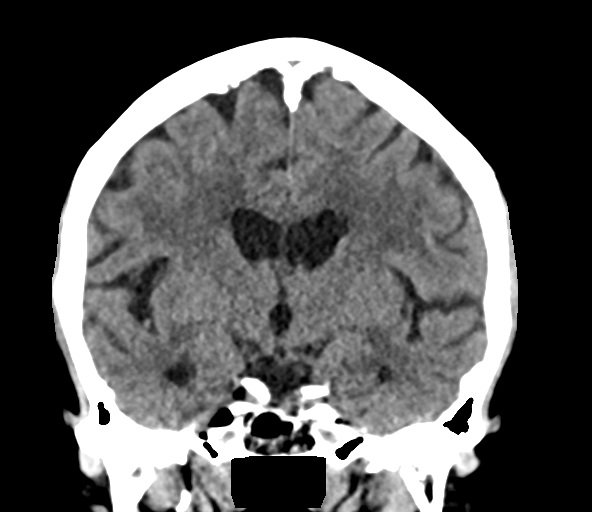

[Series 6: sag soft · sagittal · 0.29mm/px · 3 of 54 slices shown]
[im 18/54  brain]
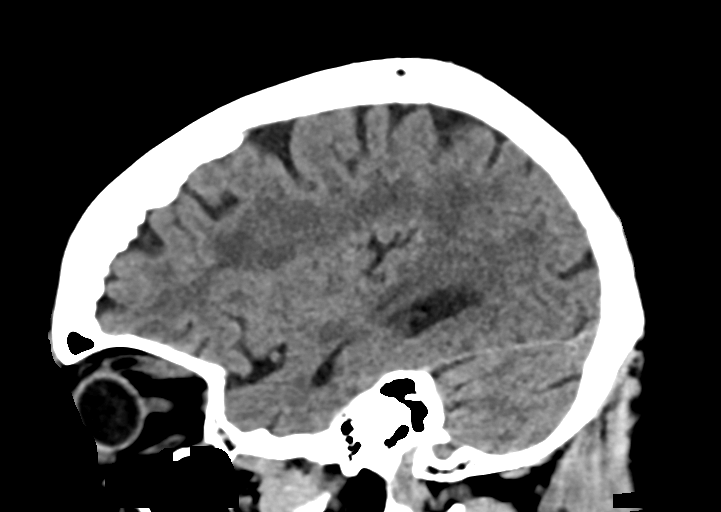
[im 27/54  brain]
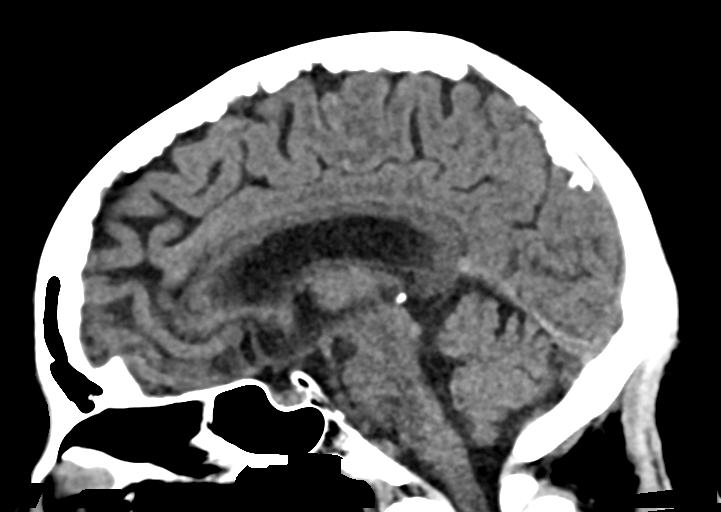
[im 36/54  brain]
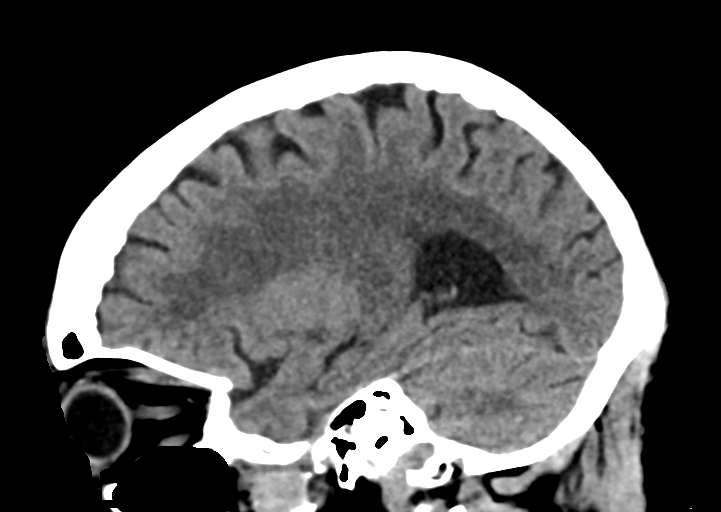

[16 of 47 positions shown; findings below may reference images not displayed]

FINDINGS: Brain: Generalized atrophy. Normal ventricular morphology. No
midline shift or mass effect. Small vessel chronic ischemic changes
of deep cerebral white matter. No intracranial hemorrhage, mass
lesion, evidence of acute infarction, or extra-axial fluid
collection.

Vascular: Atherosclerotic calcification of internal carotid arteries
at skull base

Skull: Intact

Sinuses/Orbits: Clear

Other: N/A
IMPRESSION: Atrophy with small vessel chronic ischemic changes of deep cerebral
white matter.

No acute intracranial abnormalities.

## 2022-05-25 DIAGNOSIS — Z992 Dependence on renal dialysis: Secondary | ICD-10-CM | POA: Diagnosis not present

## 2022-05-25 DIAGNOSIS — N186 End stage renal disease: Secondary | ICD-10-CM | POA: Diagnosis not present

## 2022-05-25 DIAGNOSIS — N2581 Secondary hyperparathyroidism of renal origin: Secondary | ICD-10-CM | POA: Diagnosis not present

## 2022-05-25 DIAGNOSIS — D631 Anemia in chronic kidney disease: Secondary | ICD-10-CM | POA: Diagnosis not present

## 2022-05-28 DIAGNOSIS — Z992 Dependence on renal dialysis: Secondary | ICD-10-CM | POA: Diagnosis not present

## 2022-05-28 DIAGNOSIS — N186 End stage renal disease: Secondary | ICD-10-CM | POA: Diagnosis not present

## 2022-05-28 DIAGNOSIS — D631 Anemia in chronic kidney disease: Secondary | ICD-10-CM | POA: Diagnosis not present

## 2022-05-28 DIAGNOSIS — N2581 Secondary hyperparathyroidism of renal origin: Secondary | ICD-10-CM | POA: Diagnosis not present

## 2022-05-30 DIAGNOSIS — Z992 Dependence on renal dialysis: Secondary | ICD-10-CM | POA: Diagnosis not present

## 2022-05-30 DIAGNOSIS — E1122 Type 2 diabetes mellitus with diabetic chronic kidney disease: Secondary | ICD-10-CM | POA: Diagnosis not present

## 2022-05-30 DIAGNOSIS — D631 Anemia in chronic kidney disease: Secondary | ICD-10-CM | POA: Diagnosis not present

## 2022-05-30 DIAGNOSIS — N2581 Secondary hyperparathyroidism of renal origin: Secondary | ICD-10-CM | POA: Diagnosis not present

## 2022-05-30 DIAGNOSIS — N186 End stage renal disease: Secondary | ICD-10-CM | POA: Diagnosis not present

## 2022-06-01 DIAGNOSIS — N2581 Secondary hyperparathyroidism of renal origin: Secondary | ICD-10-CM | POA: Diagnosis not present

## 2022-06-01 DIAGNOSIS — N186 End stage renal disease: Secondary | ICD-10-CM | POA: Diagnosis not present

## 2022-06-01 DIAGNOSIS — D631 Anemia in chronic kidney disease: Secondary | ICD-10-CM | POA: Diagnosis not present

## 2022-06-01 DIAGNOSIS — Z992 Dependence on renal dialysis: Secondary | ICD-10-CM | POA: Diagnosis not present

## 2022-06-04 DIAGNOSIS — D631 Anemia in chronic kidney disease: Secondary | ICD-10-CM | POA: Diagnosis not present

## 2022-06-04 DIAGNOSIS — Z992 Dependence on renal dialysis: Secondary | ICD-10-CM | POA: Diagnosis not present

## 2022-06-04 DIAGNOSIS — N2581 Secondary hyperparathyroidism of renal origin: Secondary | ICD-10-CM | POA: Diagnosis not present

## 2022-06-04 DIAGNOSIS — N186 End stage renal disease: Secondary | ICD-10-CM | POA: Diagnosis not present

## 2022-06-06 DIAGNOSIS — N186 End stage renal disease: Secondary | ICD-10-CM | POA: Diagnosis not present

## 2022-06-06 DIAGNOSIS — Z992 Dependence on renal dialysis: Secondary | ICD-10-CM | POA: Diagnosis not present

## 2022-06-06 DIAGNOSIS — N2581 Secondary hyperparathyroidism of renal origin: Secondary | ICD-10-CM | POA: Diagnosis not present

## 2022-06-06 DIAGNOSIS — D631 Anemia in chronic kidney disease: Secondary | ICD-10-CM | POA: Diagnosis not present

## 2022-06-08 DIAGNOSIS — N2581 Secondary hyperparathyroidism of renal origin: Secondary | ICD-10-CM | POA: Diagnosis not present

## 2022-06-08 DIAGNOSIS — D631 Anemia in chronic kidney disease: Secondary | ICD-10-CM | POA: Diagnosis not present

## 2022-06-08 DIAGNOSIS — Z992 Dependence on renal dialysis: Secondary | ICD-10-CM | POA: Diagnosis not present

## 2022-06-08 DIAGNOSIS — N186 End stage renal disease: Secondary | ICD-10-CM | POA: Diagnosis not present

## 2022-06-11 DIAGNOSIS — N186 End stage renal disease: Secondary | ICD-10-CM | POA: Diagnosis not present

## 2022-06-11 DIAGNOSIS — Z992 Dependence on renal dialysis: Secondary | ICD-10-CM | POA: Diagnosis not present

## 2022-06-11 DIAGNOSIS — D631 Anemia in chronic kidney disease: Secondary | ICD-10-CM | POA: Diagnosis not present

## 2022-06-11 DIAGNOSIS — N2581 Secondary hyperparathyroidism of renal origin: Secondary | ICD-10-CM | POA: Diagnosis not present

## 2022-06-12 DIAGNOSIS — N186 End stage renal disease: Secondary | ICD-10-CM | POA: Diagnosis not present

## 2022-06-12 DIAGNOSIS — Z992 Dependence on renal dialysis: Secondary | ICD-10-CM | POA: Diagnosis not present

## 2022-06-12 DIAGNOSIS — T82858A Stenosis of vascular prosthetic devices, implants and grafts, initial encounter: Secondary | ICD-10-CM | POA: Diagnosis not present

## 2022-06-15 DIAGNOSIS — D631 Anemia in chronic kidney disease: Secondary | ICD-10-CM | POA: Diagnosis not present

## 2022-06-15 DIAGNOSIS — N2581 Secondary hyperparathyroidism of renal origin: Secondary | ICD-10-CM | POA: Diagnosis not present

## 2022-06-15 DIAGNOSIS — Z992 Dependence on renal dialysis: Secondary | ICD-10-CM | POA: Diagnosis not present

## 2022-06-15 DIAGNOSIS — N186 End stage renal disease: Secondary | ICD-10-CM | POA: Diagnosis not present

## 2022-06-18 ENCOUNTER — Other Ambulatory Visit: Payer: Self-pay | Admitting: Endocrinology

## 2022-06-18 DIAGNOSIS — E1165 Type 2 diabetes mellitus with hyperglycemia: Secondary | ICD-10-CM

## 2022-06-18 DIAGNOSIS — N186 End stage renal disease: Secondary | ICD-10-CM | POA: Diagnosis not present

## 2022-06-18 DIAGNOSIS — Z992 Dependence on renal dialysis: Secondary | ICD-10-CM | POA: Diagnosis not present

## 2022-06-18 DIAGNOSIS — N2581 Secondary hyperparathyroidism of renal origin: Secondary | ICD-10-CM | POA: Diagnosis not present

## 2022-06-18 DIAGNOSIS — D631 Anemia in chronic kidney disease: Secondary | ICD-10-CM | POA: Diagnosis not present

## 2022-06-20 DIAGNOSIS — D631 Anemia in chronic kidney disease: Secondary | ICD-10-CM | POA: Diagnosis not present

## 2022-06-20 DIAGNOSIS — N186 End stage renal disease: Secondary | ICD-10-CM | POA: Diagnosis not present

## 2022-06-20 DIAGNOSIS — N2581 Secondary hyperparathyroidism of renal origin: Secondary | ICD-10-CM | POA: Diagnosis not present

## 2022-06-20 DIAGNOSIS — Z992 Dependence on renal dialysis: Secondary | ICD-10-CM | POA: Diagnosis not present

## 2022-06-22 DIAGNOSIS — N2581 Secondary hyperparathyroidism of renal origin: Secondary | ICD-10-CM | POA: Diagnosis not present

## 2022-06-22 DIAGNOSIS — Z992 Dependence on renal dialysis: Secondary | ICD-10-CM | POA: Diagnosis not present

## 2022-06-22 DIAGNOSIS — D631 Anemia in chronic kidney disease: Secondary | ICD-10-CM | POA: Diagnosis not present

## 2022-06-22 DIAGNOSIS — N186 End stage renal disease: Secondary | ICD-10-CM | POA: Diagnosis not present

## 2022-06-24 ENCOUNTER — Other Ambulatory Visit: Payer: Self-pay | Admitting: Endocrinology

## 2022-06-24 DIAGNOSIS — E1165 Type 2 diabetes mellitus with hyperglycemia: Secondary | ICD-10-CM

## 2022-06-25 DIAGNOSIS — Z992 Dependence on renal dialysis: Secondary | ICD-10-CM | POA: Diagnosis not present

## 2022-06-25 DIAGNOSIS — N186 End stage renal disease: Secondary | ICD-10-CM | POA: Diagnosis not present

## 2022-06-25 DIAGNOSIS — D631 Anemia in chronic kidney disease: Secondary | ICD-10-CM | POA: Diagnosis not present

## 2022-06-25 DIAGNOSIS — N2581 Secondary hyperparathyroidism of renal origin: Secondary | ICD-10-CM | POA: Diagnosis not present

## 2022-06-27 DIAGNOSIS — N2581 Secondary hyperparathyroidism of renal origin: Secondary | ICD-10-CM | POA: Diagnosis not present

## 2022-06-27 DIAGNOSIS — N186 End stage renal disease: Secondary | ICD-10-CM | POA: Diagnosis not present

## 2022-06-27 DIAGNOSIS — Z992 Dependence on renal dialysis: Secondary | ICD-10-CM | POA: Diagnosis not present

## 2022-06-27 DIAGNOSIS — D631 Anemia in chronic kidney disease: Secondary | ICD-10-CM | POA: Diagnosis not present

## 2022-06-29 DIAGNOSIS — N2581 Secondary hyperparathyroidism of renal origin: Secondary | ICD-10-CM | POA: Diagnosis not present

## 2022-06-29 DIAGNOSIS — D631 Anemia in chronic kidney disease: Secondary | ICD-10-CM | POA: Diagnosis not present

## 2022-06-29 DIAGNOSIS — N186 End stage renal disease: Secondary | ICD-10-CM | POA: Diagnosis not present

## 2022-06-29 DIAGNOSIS — Z992 Dependence on renal dialysis: Secondary | ICD-10-CM | POA: Diagnosis not present

## 2022-07-02 DIAGNOSIS — N186 End stage renal disease: Secondary | ICD-10-CM | POA: Diagnosis not present

## 2022-07-02 DIAGNOSIS — Z992 Dependence on renal dialysis: Secondary | ICD-10-CM | POA: Diagnosis not present

## 2022-07-02 DIAGNOSIS — N2581 Secondary hyperparathyroidism of renal origin: Secondary | ICD-10-CM | POA: Diagnosis not present

## 2022-07-04 ENCOUNTER — Other Ambulatory Visit: Payer: Self-pay | Admitting: Endocrinology

## 2022-07-04 DIAGNOSIS — E1165 Type 2 diabetes mellitus with hyperglycemia: Secondary | ICD-10-CM

## 2022-07-04 DIAGNOSIS — Z992 Dependence on renal dialysis: Secondary | ICD-10-CM | POA: Diagnosis not present

## 2022-07-04 DIAGNOSIS — N2581 Secondary hyperparathyroidism of renal origin: Secondary | ICD-10-CM | POA: Diagnosis not present

## 2022-07-04 DIAGNOSIS — N186 End stage renal disease: Secondary | ICD-10-CM | POA: Diagnosis not present

## 2022-07-06 DIAGNOSIS — N186 End stage renal disease: Secondary | ICD-10-CM | POA: Diagnosis not present

## 2022-07-06 DIAGNOSIS — N2581 Secondary hyperparathyroidism of renal origin: Secondary | ICD-10-CM | POA: Diagnosis not present

## 2022-07-06 DIAGNOSIS — Z992 Dependence on renal dialysis: Secondary | ICD-10-CM | POA: Diagnosis not present

## 2022-07-09 ENCOUNTER — Other Ambulatory Visit (INDEPENDENT_AMBULATORY_CARE_PROVIDER_SITE_OTHER): Payer: Medicare Other

## 2022-07-09 DIAGNOSIS — N2581 Secondary hyperparathyroidism of renal origin: Secondary | ICD-10-CM | POA: Diagnosis not present

## 2022-07-09 DIAGNOSIS — Z992 Dependence on renal dialysis: Secondary | ICD-10-CM | POA: Diagnosis not present

## 2022-07-09 DIAGNOSIS — N186 End stage renal disease: Secondary | ICD-10-CM | POA: Diagnosis not present

## 2022-07-09 DIAGNOSIS — E1165 Type 2 diabetes mellitus with hyperglycemia: Secondary | ICD-10-CM | POA: Diagnosis not present

## 2022-07-09 DIAGNOSIS — Z794 Long term (current) use of insulin: Secondary | ICD-10-CM | POA: Diagnosis not present

## 2022-07-09 LAB — GLUCOSE, RANDOM: Glucose, Bld: 156 mg/dL — ABNORMAL HIGH (ref 70–99)

## 2022-07-10 LAB — FRUCTOSAMINE: Fructosamine: 350 umol/L — ABNORMAL HIGH (ref 0–285)

## 2022-07-11 ENCOUNTER — Ambulatory Visit (INDEPENDENT_AMBULATORY_CARE_PROVIDER_SITE_OTHER): Payer: Medicare Other | Admitting: Podiatry

## 2022-07-11 ENCOUNTER — Encounter: Payer: Self-pay | Admitting: Endocrinology

## 2022-07-11 ENCOUNTER — Ambulatory Visit: Payer: Medicare Other | Admitting: Endocrinology

## 2022-07-11 VITALS — BP 140/55 | HR 90 | Ht 63.75 in | Wt 130.2 lb

## 2022-07-11 DIAGNOSIS — M79674 Pain in right toe(s): Secondary | ICD-10-CM

## 2022-07-11 DIAGNOSIS — E1140 Type 2 diabetes mellitus with diabetic neuropathy, unspecified: Secondary | ICD-10-CM | POA: Diagnosis not present

## 2022-07-11 DIAGNOSIS — N186 End stage renal disease: Secondary | ICD-10-CM | POA: Diagnosis not present

## 2022-07-11 DIAGNOSIS — E1151 Type 2 diabetes mellitus with diabetic peripheral angiopathy without gangrene: Secondary | ICD-10-CM

## 2022-07-11 DIAGNOSIS — Z794 Long term (current) use of insulin: Secondary | ICD-10-CM

## 2022-07-11 DIAGNOSIS — M79675 Pain in left toe(s): Secondary | ICD-10-CM

## 2022-07-11 DIAGNOSIS — B351 Tinea unguium: Secondary | ICD-10-CM

## 2022-07-11 DIAGNOSIS — N2581 Secondary hyperparathyroidism of renal origin: Secondary | ICD-10-CM | POA: Diagnosis not present

## 2022-07-11 DIAGNOSIS — E1165 Type 2 diabetes mellitus with hyperglycemia: Secondary | ICD-10-CM

## 2022-07-11 DIAGNOSIS — Z992 Dependence on renal dialysis: Secondary | ICD-10-CM | POA: Diagnosis not present

## 2022-07-11 MED ORDER — DEXCOM G7 SENSOR MISC
1.0000 | 3 refills | Status: AC
Start: 2022-07-11 — End: ?

## 2022-07-11 MED ORDER — DEXCOM G7 RECEIVER DEVI: 0 refills | Status: DC

## 2022-07-11 NOTE — Progress Notes (Signed)
Patient ID: Julie Brewer, female   DOB: Dec 15, 1950, 72 y.o.   MRN: [id number]   Reason for Appointment: Follow-up of various problems  History of Present Illness    Diagnosis: Type 2 DIABETES MELITUS, date of diagnosis:  1985  Prior history: She has been on insulin since diagnosis and on Lantus previously Also at some point had been started on Glucophage several years ago also Because of insurance preference Lantus was changed to Levemir  She refuses to use analog rapid acting insulin because of cost and is using regular insulin for several years   Her blood sugars are generally well controlled and A1c usually under 7% Her A1c previously was higher with stopping metformin at 8%  Recent history:   Insulin regimen:  NOVOLOG insulin Before eating, 12 units a.m. and 14 ac supper  Oral hypoglycemic drugs: None   Current blood sugar patterns, management and problems:  Her A1c is last 5.4  Now fructosamine is higher at 350 compared to 289  A1c is usually falsely low Although she is checking her blood sugars with her libre sensor data is still very incomplete  She was prescribed the Dexcom sensor 2 visits ago but despite reminders to follow-up her daughter did not interact with the mail-order pharmacy She is not getting any protein at breakfast and mostly toast and grits and sometimes oatmeal, she does not like to eat eggs Blood sugars after dinner are the highest of the day but quite variable and usually tending to be low normal by her bedtime Not clear why she has increased her NovoLog to 14 at suppertime instead of 12 Also blood sugars may be relatively lower mid afternoon Overnight blood sugars are overall fairly well-controlled without basal insulin but tend to be rising in the mornings before breakfast She is generally asymptomatic with low sugars at any given time  Her daughter is drawing up her insulin with the syringe and she prefers the insulin vials         Sensor:  FreeStyle libre version 2  GMI for the last 4 weeks is 6.6 Blood sugars are showing the following patterns over the last 4 weeks download Blood sugar data is mostly incomplete in the last 7 to 10 days Otherwise blood sugars are not being checked consistently to allow full interpretation of the pattern Overnight blood sugars are generally starting of low normal around midnight and then gradually increasing into the morning with some variability.  Hypoglycemia generally present before 9 PM but occasionally around midnight POSTPRANDIAL readings are modestly higher on an average after breakfast which is late morning and usually blood sugars are the lowest mid afternoon with some hypoglycemia on some days Generally blood sugars are higher after dinner with significant variability but usually are trending towards low levels by 8-9 PM at times     AVERAGE glucose 130 with HIGHEST average 160 2 in the afternoon from 2-6 PM Lowest average 76 8-10 PM Currently 2% below 70 and 85% in range   Meals:  usually 2 meals per day at 10 AM and 5 PM.     Mealtime protein sources:turkey, chicken.  Eating grits for breakfast Or oatmeal Avoiding sweet drinks  Physical activity: exercise: Some walking within the house             Wt Readings from Last 3 Encounters:  07/11/22 130 lb 3.2 oz (59.1 kg)  04/04/22 124 lb 6.4 oz (56.4 kg)  12/27/21 126 lb 6.4 oz (57.3 kg)  Lab Results  Component Value Date   HGBA1C 5.4 04/04/2022   HGBA1C 5.7 12/18/2021   HGBA1C 4.8 05/29/2021   Lab Results  Component Value Date   MICROALBUR 196.7 (H) 03/11/2018   LDLCALC 58 05/29/2021   CREATININE 5.61 (H) 07/10/2021      Lab Results  Component Value Date   FRUCTOSAMINE 350 (H) 07/09/2022   FRUCTOSAMINE 289 (H) 04/02/2022   FRUCTOSAMINE 304 (H) 12/18/2021   FRUCTOSAMINE 280 08/14/2021    Other active problems: See review of systems      Allergies as of 07/11/2022       Reactions   Morphine  And Codeine Other (See Comments)   Hallucinations    Penicillins Swelling, Rash   Throat swelling Did it involve swelling of the face/tongue/throat, SOB, or low BP? Yes Did it involve sudden or severe rash/hives, skin peeling, or any reaction on the inside of your mouth or nose? Yes Did you need to seek medical attention at a hospital or doctor's office? Yes When did it last happen?   young child    If all above answers are "NO", may proceed with cephalosporin use.        Medication List        Accurate as of July 11, 2022  9:19 PM. If you have any questions, ask your nurse or doctor.          acetaminophen 325 MG tablet Commonly known as: TYLENOL Take 2 tablets (650 mg total) by mouth every 6 (six) hours as needed for mild pain (or Fever >/= 101).   amLODipine 5 MG tablet Commonly known as: NORVASC Take 5 mg by mouth daily.   aspirin EC 325 MG tablet Take 1 tablet (325 mg total) by mouth daily.   atorvastatin 20 MG tablet Commonly known as: LIPITOR Take 1 tablet by mouth once daily   bisacodyl 5 MG EC tablet Commonly known as: DULCOLAX Take 5 mg by mouth daily as needed for moderate constipation.   cilostazol 100 MG tablet Commonly known as: PLETAL Take 1 tablet (100 mg total) by mouth 2 (two) times daily.   Dexcom G7 Receiver Devi To display CGM data   Dexcom G7 Sensor Misc 1 Device by Does not apply route as directed. Change sensor every 10 days   docusate sodium 100 MG capsule Commonly known as: COLACE Take 200 mg by mouth daily as needed for mild constipation.   FreeStyle Freedom Kit Use to check blood sugar once a day dx code   FREESTYLE LITE test strip Generic drug: glucose blood USE AS INSTRUCTED TO CHECK BLOOD SUGAR ONCE DAILY.   gabapentin 100 MG capsule Commonly known as: NEURONTIN TAKE 1 CAPSULE BY MOUTH TWICE DAILY AS NEEDED   Insulin Pen Needle 31G X 5 MM Misc Use with pen   lidocaine-prilocaine cream Commonly known as: EMLA 1  application. daily as needed (port access).   NovoLOG ReliOn 100 UNIT/ML injection Generic drug: insulin aspart INJECT 12 UNITS INTO THE SKIN DAILY BEFORE BREAKFAST AND INJECT 14 UNITS BEFORE SUPPER   pantoprazole 40 MG tablet Commonly known as: PROTONIX Take 1 tablet (40 mg) by mouth twice daily for 4 weeks, then switch to 1 tablet once daily. What changed:  how much to take how to take this when to take this additional instructions   triamcinolone ointment 0.1 % Commonly known as: KENALOG 1 application  daily.   Vitamin D (Ergocalciferol) 1.25 MG (50000 UNIT) Caps capsule Commonly known as: DRISDOL Take  1 capsule by mouth once a week What changed:  how much to take how to take this when to take this additional instructions        Allergies:  Allergies  Allergen Reactions   Morphine And Codeine Other (See Comments)    Hallucinations    Penicillins Swelling and Rash    Throat swelling Did it involve swelling of the face/tongue/throat, SOB, or low BP? Yes Did it involve sudden or severe rash/hives, skin peeling, or any reaction on the inside of your mouth or nose? Yes Did you need to seek medical attention at a hospital or doctor's office? Yes When did it last happen?   young child    If all above answers are "NO", may proceed with cephalosporin use.    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   CHF (congestive heart failure) (HCC)    Chronic kidney disease    Tu/Th/Sa   Diabetes mellitus    Type II per Dr Lucianne Muss  (patient said type I)   Fibroid    GERD (gastroesophageal reflux disease)    Glaucoma    Hyperlipidemia    Hypertension    Iron deficiency anemia 03/09/2016   Peripheral vascular disease (HCC)    Pneumonia 2006   PONV (postoperative nausea and vomiting)    Seizure disorder (HCC)    Shortness of breath dyspnea    with  exdrtion, "Walkling too fast"   Stroke (HCC)    no residual    Past Surgical History:  Procedure Laterality Date   ABDOMINAL HYSTERECTOMY     AV FISTULA PLACEMENT Left 04/27/2019   Procedure: INSERTION OF ARTERIOVENOUS (AV) GORE-TEX GRAFT LEFT UPPER ARM;  Surgeon: Chuck Hint, MD;  Location: [facility] OR;  Service: Vascular;  Laterality: Left;   BIOPSY  06/10/2017   Procedure: BIOPSY;  Surgeon: Kerin Salen, MD;  Location: [facility] ENDOSCOPY;  Service: Gastroenterology;;   BIOPSY  04/10/2021   Procedure: BIOPSY;  Surgeon: Kathi Der, MD;  Location: [facility] ENDOSCOPY;  Service: Gastroenterology;;   BREAST BIOPSY Right    CERVICAL FUSION     with graft from hip   COLONOSCOPY  July 09, 2012   DIRECT LARYNGOSCOPY N/A 06/07/2014   Procedure: DIRECT LARYNGOSCOPY with BIOPSY and EXCISION VOLLECULAR CYST;  Surgeon: Melvenia Beam, MD;  Location: [facility] OR;  Service: ENT;  Laterality: N/A;   ESOPHAGOGASTRODUODENOSCOPY (EGD) WITH PROPOFOL Left 06/10/2017   Procedure: ESOPHAGOGASTRODUODENOSCOPY (EGD) WITH PROPOFOL;  Surgeon: Kerin Salen, MD;  Location: [facility] ENDOSCOPY;  Service: Gastroenterology;  Laterality: Left;   ESOPHAGOGASTRODUODENOSCOPY (EGD) WITH PROPOFOL N/A 04/10/2021   Procedure: ESOPHAGOGASTRODUODENOSCOPY (EGD) WITH PROPOFOL;  Surgeon: Kathi Der, MD;  Location: [facility] ENDOSCOPY;  Service: Gastroenterology;  Laterality: N/A;   FLEXIBLE SIGMOIDOSCOPY Left 06/10/2017   Procedure: FLEXIBLE SIGMOIDOSCOPY;  Surgeon: Kerin Salen, MD;  Location: [facility] ENDOSCOPY;  Service: Gastroenterology;  Laterality: Left;   HOT HEMOSTASIS N/A 04/10/2021   Procedure: HOT HEMOSTASIS (ARGON PLASMA COAGULATION/BICAP);  Surgeon: Kathi Der, MD;  Location: [facility] ENDOSCOPY;  Service: Gastroenterology;  Laterality: N/A;   LOOP RECORDER INSERTION N/A 06/20/2016   Procedure: Loop Recorder Insertion;  Surgeon: Thurmon Fair, MD;  Location: [facility] INVASIVE CV LAB;  Service: Cardiovascular;  Laterality: N/A;   REFRACTIVE SURGERY  Bilateral    both eyes   SPINE SURGERY     lumbar    Family History  Problem Relation Age of Onset   Cancer Mother    Heart disease Mother    Diabetes Father    Cancer Brother    Cancer Brother    Throat cancer Brother     Social History:  reports that she has been smoking cigarettes. She has a 30.00 pack-year smoking history. She has never used smokeless tobacco. She reports that she does not currently use alcohol. She reports that she does not currently use drugs after having used the following drugs: Methylphenidate.  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     HYPERLIPIDEMIA: The lipid abnormality consists of elevated LDL controlled with Lipitor 20 mg .   Her baseline LDL was 202  LDL is consistently well controlled and triglycerides have been normal .  Lab Results  Component Value Date   CHOL 146 05/29/2021   HDL 67.40 05/29/2021   LDLCALC 58 05/29/2021   LDLDIRECT 37.0 04/02/2022   TRIG 100.0 05/29/2021   CHOLHDL 2 05/29/2021       Last diabetic foot exam was done in 06/2022 She has vascular disease on the left side followed by vascular specialist  Has been taking gabapentin 100mg  twice daily for lower leg leg pains with relief Has numbness in feet   She has had her Covid vaccines   Examination:   BP (!) 140/55 (BP Location: Right Arm, Patient Position: Sitting, Cuff Size: Normal)   Pulse 90   Ht 5' 3.75" (1.619 m)   Wt 130 lb 3.2 oz (59.1 kg)   SpO2 94%   BMI 22.52 kg/m   Body mass index is 22.52 kg/m.   Diabetic Foot Exam - Simple   Simple Foot Form Diabetic Foot exam was performed with the following findings: Yes   Visual Inspection No deformities, no ulcerations,  no other skin breakdown bilaterally: Yes Sensation Testing See comments: Yes Pulse Check See comments: Yes Comments Monofilament sensation is significantly decreased in the distal toes bilaterally Pedal pulses not palpable Mild hallux valgus of the smaller toes on the left     ASSESSMENT/ PLAN:     Diabetes type 2 on insulin See history of present illness for detailed discussion of current diabetes management, blood sugar patterns and problems identified  Her A1c is falsely low as usual but GMI now is 6.6 However this is within complete data in the last month  Fructosamine is higher at 350  She is on mealtime insulin with NOVOLOG insulin as only treatment  Blood sugars are difficult to regulate because of variable blood sugars with some postprandial hyperglycemia especially in the evening but then tendency to low normal or low readings 4 to 5 hours after mealtime doses Also not getting any protein at dinnertime   Recommendations: Another prescription sent for the Dexcom G7 to the [facility] online Her granddaughter was given the information about this pharmacy to call next week She can add some protein like form if she is like cottage cheese or other protein with breakfast consistently She will reduce her suppertime insulin by 2 units In the meantime she will make sure she is having her blood sugar checked 4 times a day   Peripheral neuropathy: Discussed need  to have her family check her feet daily and also avoid going barefoot Forms for diabetic shoes will be filled out  Patient Instructions  Have a little cottage cheese or other protein with breakfast and smaller portion of grits Reduce insulin doses to 12 units at suppertime instead of 14  When using the freestyle libre sensor make sure you check the blood sugar 4 times a day  Follow-up with the [facility] pharmacy to see if they have approved the Dexcom sensor to replace the [place]  Call if having excessive low  sugars  Reather Littler 07/11/2022, 9:19 PM     Note: This office note was prepared with Dragon voice recognition system technology. Any transcriptional errors that result from this process are unintentional.

## 2022-07-11 NOTE — Progress Notes (Signed)
  Subjective:  Patient ID: Julie Brewer, female    DOB: 14-Sep-1950,  MRN: [id number]  Chief Complaint  Patient presents with   Nail Problem    Nail trim    71 y.o. female returns for the above complaint.  Patient is a type II diabetic whose A1c is 6.1.  Patient presents with thickened elongated mycotic toenails x10.  Patient states that she is not able to cut it herself.  She would like to bring [place] to debride them down.  She does not have any secondary complaints Objective:   There were no vitals filed for this visit.  Podiatric Exam: Vascular: dorsalis pedis and posterior tibial pulses are palpable bilateral. Capillary return is immediate. Temperature gradient is WNL. Skin turgor WNL  Sensorium: Normal Semmes Weinstein monofilament test. Normal tactile sensation bilaterally. Nail Exam: Pt has thick disfigured discolored nails with subungual debris noted bilateral entire nail hallux through fifth toenails Ulcer Exam: There is no evidence of ulcer or pre-ulcerative changes or infection. Orthopedic Exam: Muscle tone and strength are WNL. No limitations in general ROM. No crepitus or effusions noted. HAV  B/L.  Hammer toes 2-5  B/L. Skin: No Porokeratosis. No infection or ulcers.  Submet 3 hyperkeratotic lesion/callus x2.  Pain on palpation.  Assessment & Plan:  Patient was evaluated and treated and all questions answered.  Hammertoe contractures bilateral two through five -Clinically doing well and for management has been fine without ulcers -Awaiting diabetic shoes  Onychomycosis with pain  -Nails palliatively debrided as below. -Educated on self-care  Procedure: Nail Debridement Rationale: pain  Type of Debridement: manual, sharp debridement. Instrumentation: Nail nipper, rotary burr. Number of Nails: 10  Procedures and Treatment: Consent by patient was obtained for treatment procedures. The patient understood the discussion of treatment and procedures well. All questions  were answered thoroughly reviewed. Debridement of mycotic and hypertrophic toenails, 1 through 5 bilateral and clearing of subungual debris. No ulceration, no infection noted.  Return Visit-Office Procedure: Patient instructed to return to the office for a follow up visit 3 months for continued evaluation and treatment.  Nicholes Rough, DPM    No follow-ups on file.  Toenails x10.  Hammertoe bilateral diabetic shoes

## 2022-07-11 NOTE — Patient Instructions (Addendum)
Have a little cottage cheese or other protein with breakfast and smaller portion of grits Reduce insulin doses to 12 units at suppertime instead of 14  When using the freestyle libre sensor make sure you check the blood sugar 4 times a day  Follow-up with the [facility] pharmacy to see if they have approved the Dexcom sensor to replace the [place]  Call if having excessive low sugars

## 2022-07-13 ENCOUNTER — Ambulatory Visit: Payer: Medicare Other | Admitting: Podiatry

## 2022-07-14 DIAGNOSIS — N186 End stage renal disease: Secondary | ICD-10-CM | POA: Diagnosis not present

## 2022-07-14 DIAGNOSIS — Z992 Dependence on renal dialysis: Secondary | ICD-10-CM | POA: Diagnosis not present

## 2022-07-14 DIAGNOSIS — D631 Anemia in chronic kidney disease: Secondary | ICD-10-CM | POA: Diagnosis not present

## 2022-07-14 DIAGNOSIS — N2581 Secondary hyperparathyroidism of renal origin: Secondary | ICD-10-CM | POA: Diagnosis not present

## 2022-07-17 ENCOUNTER — Other Ambulatory Visit: Payer: Self-pay | Admitting: Endocrinology

## 2022-07-17 DIAGNOSIS — N186 End stage renal disease: Secondary | ICD-10-CM | POA: Diagnosis not present

## 2022-07-17 DIAGNOSIS — D631 Anemia in chronic kidney disease: Secondary | ICD-10-CM | POA: Diagnosis not present

## 2022-07-17 DIAGNOSIS — N2581 Secondary hyperparathyroidism of renal origin: Secondary | ICD-10-CM | POA: Diagnosis not present

## 2022-07-17 DIAGNOSIS — Z992 Dependence on renal dialysis: Secondary | ICD-10-CM | POA: Diagnosis not present

## 2022-07-17 DIAGNOSIS — E1165 Type 2 diabetes mellitus with hyperglycemia: Secondary | ICD-10-CM

## 2022-07-19 DIAGNOSIS — D631 Anemia in chronic kidney disease: Secondary | ICD-10-CM | POA: Diagnosis not present

## 2022-07-19 DIAGNOSIS — N2581 Secondary hyperparathyroidism of renal origin: Secondary | ICD-10-CM | POA: Diagnosis not present

## 2022-07-19 DIAGNOSIS — N186 End stage renal disease: Secondary | ICD-10-CM | POA: Diagnosis not present

## 2022-07-19 DIAGNOSIS — Z992 Dependence on renal dialysis: Secondary | ICD-10-CM | POA: Diagnosis not present

## 2022-07-21 DIAGNOSIS — Z992 Dependence on renal dialysis: Secondary | ICD-10-CM | POA: Diagnosis not present

## 2022-07-21 DIAGNOSIS — N186 End stage renal disease: Secondary | ICD-10-CM | POA: Diagnosis not present

## 2022-07-21 DIAGNOSIS — D631 Anemia in chronic kidney disease: Secondary | ICD-10-CM | POA: Diagnosis not present

## 2022-07-21 DIAGNOSIS — N2581 Secondary hyperparathyroidism of renal origin: Secondary | ICD-10-CM | POA: Diagnosis not present

## 2022-07-24 DIAGNOSIS — Z992 Dependence on renal dialysis: Secondary | ICD-10-CM | POA: Diagnosis not present

## 2022-07-24 DIAGNOSIS — N2581 Secondary hyperparathyroidism of renal origin: Secondary | ICD-10-CM | POA: Diagnosis not present

## 2022-07-24 DIAGNOSIS — D631 Anemia in chronic kidney disease: Secondary | ICD-10-CM | POA: Diagnosis not present

## 2022-07-24 DIAGNOSIS — N186 End stage renal disease: Secondary | ICD-10-CM | POA: Diagnosis not present

## 2022-07-26 ENCOUNTER — Encounter: Payer: Self-pay | Admitting: Endocrinology

## 2022-07-26 DIAGNOSIS — D631 Anemia in chronic kidney disease: Secondary | ICD-10-CM | POA: Diagnosis not present

## 2022-07-26 DIAGNOSIS — N186 End stage renal disease: Secondary | ICD-10-CM | POA: Diagnosis not present

## 2022-07-26 DIAGNOSIS — N2581 Secondary hyperparathyroidism of renal origin: Secondary | ICD-10-CM | POA: Diagnosis not present

## 2022-07-26 DIAGNOSIS — Z992 Dependence on renal dialysis: Secondary | ICD-10-CM | POA: Diagnosis not present

## 2022-07-28 DIAGNOSIS — N2581 Secondary hyperparathyroidism of renal origin: Secondary | ICD-10-CM | POA: Diagnosis not present

## 2022-07-28 DIAGNOSIS — Z992 Dependence on renal dialysis: Secondary | ICD-10-CM | POA: Diagnosis not present

## 2022-07-28 DIAGNOSIS — D631 Anemia in chronic kidney disease: Secondary | ICD-10-CM | POA: Diagnosis not present

## 2022-07-28 DIAGNOSIS — N186 End stage renal disease: Secondary | ICD-10-CM | POA: Diagnosis not present

## 2022-08-06 ENCOUNTER — Other Ambulatory Visit: Payer: Self-pay

## 2022-08-06 DIAGNOSIS — E782 Mixed hyperlipidemia: Secondary | ICD-10-CM

## 2022-08-06 DIAGNOSIS — E1165 Type 2 diabetes mellitus with hyperglycemia: Secondary | ICD-10-CM

## 2022-08-06 MED ORDER — ATORVASTATIN CALCIUM 20 MG PO TABS
20.00 mg | ORAL_TABLET | Freq: Every day | ORAL | 0 refills | Status: AC
Start: 2022-08-06 — End: ?

## 2022-08-06 MED ORDER — DEXCOM G7 RECEIVER DEVI
0 refills | Status: DC
Start: 2022-08-06 — End: 2022-08-17

## 2022-08-06 MED ORDER — INSULIN ASPART 100 UNIT/ML IJ SOLN
INTRAMUSCULAR | 5 refills | Status: AC
Start: 2022-08-06 — End: ?

## 2022-08-06 MED ORDER — GABAPENTIN 100 MG PO CAPS
ORAL_CAPSULE | ORAL | 0 refills | Status: AC
Start: 2022-08-06 — End: ?

## 2022-08-17 ENCOUNTER — Telehealth: Payer: Self-pay

## 2022-08-17 DIAGNOSIS — E1165 Type 2 diabetes mellitus with hyperglycemia: Secondary | ICD-10-CM

## 2022-08-17 MED ORDER — DEXCOM G7 RECEIVER DEVI
0 refills | Status: AC
Start: 2022-08-17 — End: ?

## 2022-08-17 NOTE — Telephone Encounter (Signed)
Leigh Ann Roberts, CMA  ?

## 2022-10-11 ENCOUNTER — Ambulatory Visit: Payer: Medicare Other | Admitting: Podiatry

## 2022-12-17 NOTE — Progress Notes (Signed)
Shoes and inserts came in we have called patient, granddaughter stopped by today stating patient has moved to [place]  We will cancel order and send back  Addison Bailey Cped, CFo, CFm

## 2023-12-09 NOTE — Progress Notes (Signed)
 Patient: Julie Brewer, Julie Brewer [birth year], 73y, F Dialysis Location: [facility] [place] Attending Nephrologist: Deward Rooks  Service Date: 12/09/2023 Service Provider: Deward Rooks, MD I met face to face with the patient today.  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 with diffuse sever eIFTA  - GERD on PPI - failed H2 blocker  - blind 2/2 glaucoma   -HTN- -HLD   -hx TIA /PVD   -CHFpEF -  COPD/tobacco/emphysema -   DNR - confirmed 03/03/19, reconfirmed in [place]  07/31/22 continues to smoke, no etoh came to [place]  to visit 6/24 and has opted to stay with niece  HOME MEDICATIONS  Comments: reviewed  LAST HOSPITALIZATION Discharge Diagnosis: R55 Syncope and collapse I95.1 Orthostatic hypotension Admission Date 05/31/23 Discharge Date 05/31/23  TRANSPLANT Comments: has declined eval  DIALYSIS PRESCRIPTION   IHD 3x Week Start date: 10/04/23   Dialyzer: 180NRe Optiflux   BFR: 350   DFR: Manual 500   Potassium: 3.0   Sodium: 137   EDW: 60   Duration: 3:00   Calcium : 2.5   Bicarb: 35   Rx updated on: 10/04/2023  Comments: 12/09/23: no new issues. access functioning ok. headed to [facility] tomorrow for vaca. labs as noted. was on Zio per [employer]. will change to 2k bath in view of mild hyperK and holding hectorol  with high ca and suppressed pth -PK  10/20: no new issues. access qb ok. dw reviewed. hgb down slt and will watch. -PK  10/13: no new issues. had some LH and HA with HD on Friday.  will monitor for now. meds reviewed. data is as below. -PK  10/8: no new issues. access has been functioning ok. wts reviewed. await labs. -PK  9/26: no new issues. getting thru a uri. access has been working  ok. -PK  9/15:  no new issues. labs reviewed. p up and to bump binder to 2 q ac. wt ok and qb 350-PK  9/12: no new issues. labs still with issues. ktv and hgb ok. dw reviewed. -PK  10/07/23: idt rounds. no new issues. access ok. data is as below. -PK   8/25: no new issues. constipation cleared with miralax .  dw ok. hgb ok. -PK   09/09/23: no new issues. access ok. did ok with trip to [facility] constipation an issue and we talked about increasing miralax . -PK  TREATMENT ASSESSMENT Comments: bp ok  BP Stand Pre   12/06/2023: 154/77   12/04/2023: 134/66   12/02/2023: 180/76  BP Sit Pre   12/06/2023: 168/78   12/04/2023: 152/82   12/02/2023: 181/87  BP Stand Post   12/06/2023: 133/72   12/04/2023: 110/46   12/02/2023: 149/60  BP Sit Post   12/06/2023: 160/81   12/04/2023: 114/56   12/02/2023: 130/72  Prescribed Tx time   12/06/2023: 3:00   12/04/2023: 3:00   12/02/2023: 3:00  Tx Duration   12/06/2023: 3:09   12/04/2023: 2:51   12/02/2023: 2:59  Missed Treatments 0 - last 30 days 1 - last 60 days 9/22 - recent  FLUID ASSESSMENT Comments: dw ok  EDW (kg)   12/06/2023: 60.0   12/04/2023: 60.0   12/02/2023: 60.0  Weight Pre (kg)   12/06/2023: 61.4   12/04/2023: 61.8   12/02/2023: 62.0  Weight Post (kg)  12/06/2023: 60.1   12/04/2023: 60.4   12/02/2023: 61.4  PWV (kg)   12/06/2023: 0.1   12/04/2023: 0.4   12/02/2023: 1.4  UF Rate (mL/kg/hr)   12/06/2023: 6.9   12/04/2023: 8.1   12/02/2023: 3.3  ADEQUACY ASSESSMENT Comments: ktv ok  spKt/V, URR   12/04/2023: 1.62, 76.2   11/06/2023: 1.69, 78.0   10/09/2023: 1.61, 77.0  ACCESS ASSESSMENT   Access Type: CVCatheter   Access SubType: Tunneled   Access Status: Active (In Use) - 11/28/2022   Access Location: Chest   Placed: --/--/----  Comments: graft infection and explant 11/26/22, using rt ij pc new avg at [facility] 02/04/23 ligated due to steal for a permanent catheter per vasc at [facility]-  ANEMIA  ASSESSMENT Comments: hgb ok cera 75 every 4 wks isat a little marginal  HGB, TSAT   12/04/2023: -, 21.0   12/02/2023: 10.8, -   11/25/2023: 10.0, -    Ferritin   11/06/2023: 737.0   09/13/2023: 1418.0   08/07/2023: 1419.0  Mircera, IVP (mcg)   11/22/2023: 75   11/08/2023: 50   10/18/2023: 30  Iron Sucrose  (Venofer ) (mg)   12/04/2023: 100   12/02/2023: 100   11/29/2023: 100  BMM ASSESSMENT Comments: ca up and hectorol  now held p ok and pth was suppressed (as above hectorol  held)  PTH, Intact   12/06/2023: 83.0   11/06/2023: 164.0   10/02/2023: 246.0    Calcium , Phosphorus   12/04/2023: 10.6, 4.0   11/06/2023: 9.4, 4.7   10/11/2023: 10.0, 6.0  Doxercalciferol  (Hectorol ) (mcg)   12/06/2023: 5   12/04/2023: 5   12/02/2023: 5  NUTRITION ASSESSMENT Comments: k ws slt high and d/w her--will change to 2k bath alb ok  Potassium, Albumin   12/04/2023: 5.9, 4.0   11/06/2023: 5.2, 3.8   10/11/2023: 4.5, 4.1    eNPCR   12/04/2023: 0.74   11/06/2023: 0.85   10/09/2023: 0.74  PHYSICAL EXAM Comments: rt ij pc  DIAGNOSIS Chief Complaint: N18.6 End stage renal disease   Patient data updated 12/09/2023 at 4:15 PM Signed By: Charmayne Mt, MD  on 12/09/2023 4:19:29 PM

## 2024-01-30 DEATH — deceased
